# Patient Record
Sex: Female | Born: 1945 | Race: White | Hispanic: No | Smoking: Never smoker
Health system: Southern US, Community
[De-identification: ages and names within clinical notes are randomized; demographics above are authoritative.]

## PROBLEM LIST (undated history)

## (undated) DIAGNOSIS — K5900 Constipation, unspecified: Secondary | ICD-10-CM

## (undated) DIAGNOSIS — I639 Cerebral infarction, unspecified: Secondary | ICD-10-CM

## (undated) DIAGNOSIS — T63331A Toxic effect of venom of brown recluse spider, accidental (unintentional), initial encounter: Secondary | ICD-10-CM

## (undated) DIAGNOSIS — T8859XA Other complications of anesthesia, initial encounter: Secondary | ICD-10-CM

## (undated) DIAGNOSIS — J189 Pneumonia, unspecified organism: Secondary | ICD-10-CM

## (undated) DIAGNOSIS — G4730 Sleep apnea, unspecified: Secondary | ICD-10-CM

## (undated) DIAGNOSIS — C50919 Malignant neoplasm of unspecified site of unspecified female breast: Secondary | ICD-10-CM

## (undated) DIAGNOSIS — E039 Hypothyroidism, unspecified: Secondary | ICD-10-CM

## (undated) DIAGNOSIS — Z1379 Encounter for other screening for genetic and chromosomal anomalies: Secondary | ICD-10-CM

## (undated) DIAGNOSIS — T4145XA Adverse effect of unspecified anesthetic, initial encounter: Secondary | ICD-10-CM

## (undated) DIAGNOSIS — Z86010 Personal history of colonic polyps: Secondary | ICD-10-CM

## (undated) HISTORY — DX: Hypothyroidism, unspecified: E03.9

## (undated) HISTORY — PX: TUBAL LIGATION: SHX77

## (undated) HISTORY — DX: Morbid (severe) obesity due to excess calories: E66.01

## (undated) HISTORY — DX: Encounter for other screening for genetic and chromosomal anomalies: Z13.79

## (undated) HISTORY — DX: Personal history of colonic polyps: Z86.010

## (undated) HISTORY — PX: CATARACT EXTRACTION: SUR2

## (undated) HISTORY — DX: Sleep apnea, unspecified: G47.30

## (undated) HISTORY — DX: Toxic effect of venom of brown recluse spider, accidental (unintentional), initial encounter: T63.331A

## (undated) HISTORY — PX: WISDOM TOOTH EXTRACTION: SHX21

---

## 1985-05-20 DIAGNOSIS — J189 Pneumonia, unspecified organism: Secondary | ICD-10-CM

## 1985-05-20 HISTORY — DX: Pneumonia, unspecified organism: J18.9

## 1996-05-20 HISTORY — PX: INCISION AND DRAINAGE: SHX5863

## 1999-09-08 ENCOUNTER — Ambulatory Visit: Admission: RE | Admit: 1999-09-08 | Discharge: 1999-09-08 | Payer: Self-pay | Admitting: Internal Medicine

## 2000-01-10 ENCOUNTER — Other Ambulatory Visit: Admission: RE | Admit: 2000-01-10 | Discharge: 2000-01-10 | Payer: Self-pay | Admitting: Internal Medicine

## 2000-05-05 ENCOUNTER — Encounter: Payer: Self-pay | Admitting: Internal Medicine

## 2000-05-05 ENCOUNTER — Encounter: Admission: RE | Admit: 2000-05-05 | Discharge: 2000-05-05 | Payer: Self-pay | Admitting: Internal Medicine

## 2000-06-05 ENCOUNTER — Other Ambulatory Visit: Admission: RE | Admit: 2000-06-05 | Discharge: 2000-06-05 | Payer: Self-pay | Admitting: Obstetrics and Gynecology

## 2001-01-29 ENCOUNTER — Other Ambulatory Visit: Admission: RE | Admit: 2001-01-29 | Discharge: 2001-01-29 | Payer: Self-pay | Admitting: Obstetrics and Gynecology

## 2002-01-22 ENCOUNTER — Other Ambulatory Visit: Admission: RE | Admit: 2002-01-22 | Discharge: 2002-01-22 | Payer: Self-pay | Admitting: Internal Medicine

## 2004-11-14 ENCOUNTER — Ambulatory Visit: Payer: Self-pay | Admitting: Internal Medicine

## 2004-11-21 ENCOUNTER — Ambulatory Visit: Payer: Self-pay | Admitting: Internal Medicine

## 2004-11-23 ENCOUNTER — Ambulatory Visit: Payer: Self-pay | Admitting: Cardiology

## 2005-01-03 ENCOUNTER — Ambulatory Visit: Payer: Self-pay | Admitting: Internal Medicine

## 2005-02-14 ENCOUNTER — Ambulatory Visit: Payer: Self-pay | Admitting: Gastroenterology

## 2005-02-28 ENCOUNTER — Ambulatory Visit: Payer: Self-pay | Admitting: Gastroenterology

## 2005-05-02 ENCOUNTER — Ambulatory Visit: Payer: Self-pay | Admitting: Internal Medicine

## 2005-05-08 ENCOUNTER — Ambulatory Visit: Payer: Self-pay | Admitting: Gastroenterology

## 2005-06-13 ENCOUNTER — Ambulatory Visit: Payer: Self-pay | Admitting: Internal Medicine

## 2008-11-24 ENCOUNTER — Emergency Department (HOSPITAL_COMMUNITY): Admission: EM | Admit: 2008-11-24 | Discharge: 2008-11-24 | Payer: Self-pay | Admitting: Emergency Medicine

## 2008-12-30 ENCOUNTER — Ambulatory Visit: Payer: Self-pay | Admitting: Internal Medicine

## 2008-12-30 LAB — CONVERTED CEMR LAB
ALT: 40 units/L — ABNORMAL HIGH (ref 0–35)
AST: 43 units/L — ABNORMAL HIGH (ref 0–37)
Alkaline Phosphatase: 79 units/L (ref 39–117)
BUN: 7 mg/dL (ref 6–23)
Basophils Absolute: 0 10*3/uL (ref 0.0–0.1)
Basophils Relative: 0.1 % (ref 0.0–3.0)
Bilirubin Urine: NEGATIVE
Bilirubin, Direct: 0.1 mg/dL (ref 0.0–0.3)
CO2: 25 meq/L (ref 19–32)
Calcium: 9.1 mg/dL (ref 8.4–10.5)
Chloride: 108 meq/L (ref 96–112)
Cholesterol: 187 mg/dL (ref 0–200)
Creatinine, Ser: 0.6 mg/dL (ref 0.4–1.2)
Eosinophils Absolute: 0.1 10*3/uL (ref 0.0–0.7)
Eosinophils Relative: 2.3 % (ref 0.0–5.0)
GFR calc non Af Amer: 106.64 mL/min (ref >60)
Glucose, Bld: 100 mg/dL — ABNORMAL HIGH (ref 70–99)
Glucose, Urine, Semiquant: NEGATIVE
HCT: 42.4 % (ref 36.0–46.0)
HDL: 41.1 mg/dL (ref >39.00)
Hemoglobin: 14.3 g/dL (ref 12.0–15.0)
Ketones, urine, test strip: NEGATIVE
LDL Cholesterol: 118 mg/dL — ABNORMAL HIGH (ref 0–99)
Lymphocytes Relative: 45 % (ref 12.0–46.0)
Lymphs Abs: 2.4 10*3/uL (ref 0.7–4.0)
MCHC: 33.7 g/dL (ref 30.0–36.0)
MCV: 97.6 fL (ref 78.0–100.0)
Monocytes Absolute: 0.4 10*3/uL (ref 0.1–1.0)
Monocytes Relative: 6.9 % (ref 3.0–12.0)
Neutro Abs: 2.5 10*3/uL (ref 1.4–7.7)
Neutrophils Relative %: 45.7 % (ref 43.0–77.0)
Nitrite: NEGATIVE
Platelets: 161 10*3/uL (ref 150.0–400.0)
Potassium: 3.9 meq/L (ref 3.5–5.1)
RBC: 4.35 M/uL (ref 3.87–5.11)
RDW: 12.2 % (ref 11.5–14.6)
Sodium: 141 meq/L (ref 135–145)
Specific Gravity, Urine: 1.025
TSH: 4.99 microintl units/mL (ref 0.35–5.50)
Total Bilirubin: 0.8 mg/dL (ref 0.3–1.2)
Total CHOL/HDL Ratio: 5
Triglycerides: 139 mg/dL (ref 0.0–149.0)
Urobilinogen, UA: 0.2
VLDL: 27.8 mg/dL (ref 0.0–40.0)
WBC: 5.4 10*3/uL (ref 4.5–10.5)
pH: 5.5

## 2009-01-05 ENCOUNTER — Ambulatory Visit: Payer: Self-pay | Admitting: Internal Medicine

## 2009-01-05 DIAGNOSIS — Z860100 Personal history of colon polyps, unspecified: Secondary | ICD-10-CM

## 2009-01-05 DIAGNOSIS — I10 Essential (primary) hypertension: Secondary | ICD-10-CM | POA: Insufficient documentation

## 2009-01-05 HISTORY — DX: Morbid (severe) obesity due to excess calories: E66.01

## 2009-01-05 HISTORY — DX: Personal history of colon polyps, unspecified: Z86.0100

## 2009-01-05 HISTORY — DX: Personal history of colonic polyps: Z86.010

## 2010-01-25 ENCOUNTER — Ambulatory Visit: Payer: Self-pay | Admitting: Internal Medicine

## 2010-01-29 LAB — CONVERTED CEMR LAB
ALT: 47 units/L — ABNORMAL HIGH (ref 0–35)
AST: 41 units/L — ABNORMAL HIGH (ref 0–37)
Albumin: 3.7 g/dL (ref 3.5–5.2)
Alkaline Phosphatase: 78 units/L (ref 39–117)
BUN: 10 mg/dL (ref 6–23)
Basophils Absolute: 0 10*3/uL (ref 0.0–0.1)
Basophils Relative: 0.6 % (ref 0.0–3.0)
Bilirubin Urine: NEGATIVE
Bilirubin, Direct: 0.2 mg/dL (ref 0.0–0.3)
CO2: 30 meq/L (ref 19–32)
Calcium: 9.1 mg/dL (ref 8.4–10.5)
Chloride: 105 meq/L (ref 96–112)
Cholesterol: 213 mg/dL — ABNORMAL HIGH (ref 0–200)
Creatinine, Ser: 0.6 mg/dL (ref 0.4–1.2)
Direct LDL: 152.3 mg/dL
Eosinophils Absolute: 0.1 10*3/uL (ref 0.0–0.7)
Eosinophils Relative: 1.5 % (ref 0.0–5.0)
GFR calc non Af Amer: 102.34 mL/min (ref >60)
Glucose, Bld: 95 mg/dL (ref 70–99)
HCT: 43.7 % (ref 36.0–46.0)
HDL: 51.4 mg/dL (ref >39.00)
Hemoglobin, Urine: NEGATIVE
Hemoglobin: 15 g/dL (ref 12.0–15.0)
Ketones, ur: 15 mg/dL
Lymphocytes Relative: 45.4 % (ref 12.0–46.0)
Lymphs Abs: 3 10*3/uL (ref 0.7–4.0)
MCHC: 34.3 g/dL (ref 30.0–36.0)
MCV: 97 fL (ref 78.0–100.0)
Monocytes Absolute: 0.5 10*3/uL (ref 0.1–1.0)
Monocytes Relative: 7 % (ref 3.0–12.0)
Neutro Abs: 3 10*3/uL (ref 1.4–7.7)
Neutrophils Relative %: 45.5 % (ref 43.0–77.0)
Nitrite: NEGATIVE
Platelets: 192 10*3/uL (ref 150.0–400.0)
Potassium: 4.7 meq/L (ref 3.5–5.1)
RBC: 4.51 M/uL (ref 3.87–5.11)
RDW: 12.4 % (ref 11.5–14.6)
Sodium: 142 meq/L (ref 135–145)
Specific Gravity, Urine: >=1.03 (ref 1.000–1.030)
TSH: 5.94 microintl units/mL — ABNORMAL HIGH (ref 0.35–5.50)
Total Bilirubin: 0.9 mg/dL (ref 0.3–1.2)
Total CHOL/HDL Ratio: 4
Total Protein, Urine: NEGATIVE mg/dL
Total Protein: 6.9 g/dL (ref 6.0–8.3)
Triglycerides: 131 mg/dL (ref 0.0–149.0)
Urine Glucose: NEGATIVE mg/dL
Urobilinogen, UA: 0.2 (ref 0.0–1.0)
VLDL: 26.2 mg/dL (ref 0.0–40.0)
WBC: 6.6 10*3/uL (ref 4.5–10.5)
pH: 5.5 (ref 5.0–8.0)

## 2010-02-01 ENCOUNTER — Telehealth: Payer: Self-pay | Admitting: Internal Medicine

## 2010-02-01 ENCOUNTER — Ambulatory Visit: Payer: Self-pay | Admitting: Internal Medicine

## 2010-02-01 DIAGNOSIS — E039 Hypothyroidism, unspecified: Secondary | ICD-10-CM | POA: Insufficient documentation

## 2010-04-17 ENCOUNTER — Encounter: Payer: Self-pay | Admitting: Gastroenterology

## 2010-05-01 ENCOUNTER — Encounter (INDEPENDENT_AMBULATORY_CARE_PROVIDER_SITE_OTHER): Payer: Self-pay | Admitting: *Deleted

## 2010-05-20 HISTORY — PX: COLONOSCOPY W/ BIOPSIES AND POLYPECTOMY: SHX1376

## 2010-06-01 ENCOUNTER — Encounter (INDEPENDENT_AMBULATORY_CARE_PROVIDER_SITE_OTHER): Payer: Self-pay | Admitting: *Deleted

## 2010-06-05 ENCOUNTER — Ambulatory Visit
Admission: RE | Admit: 2010-06-05 | Discharge: 2010-06-05 | Payer: Self-pay | Source: Home / Self Care | Attending: Gastroenterology | Admitting: Gastroenterology

## 2010-06-19 ENCOUNTER — Ambulatory Visit
Admission: RE | Admit: 2010-06-19 | Discharge: 2010-06-19 | Payer: Self-pay | Source: Home / Self Care | Attending: Gastroenterology | Admitting: Gastroenterology

## 2010-06-19 ENCOUNTER — Other Ambulatory Visit: Payer: Self-pay | Admitting: Gastroenterology

## 2010-06-19 DIAGNOSIS — D126 Benign neoplasm of colon, unspecified: Secondary | ICD-10-CM

## 2010-06-19 NOTE — Assessment & Plan Note (Signed)
Summary: CPX/CJR   and  Vital Signs:  Patient profile:   65 year old female Height:      60 inches Weight:      253 pounds BMI:     49.59 Temp:     98.7 degrees F oral BP sitting:   136 / 80  (right arm) Cuff size:   large  Vitals Entered By: Duard Brady LPN (February 01, 2010 2:45 PM) CC: cpx - doing well Is Patient Diabetic? No Flu Vaccine Consent Questions     Do you have a history of severe allergic reactions to this vaccine? no    Any prior history of allergic reactions to egg and/or gelatin? no    Do you have a sensitivity to the preservative Thimersol? no    Do you have a past history of Guillan-Barre Syndrome? no    Do you currently have an acute febrile illness? no    Have you ever had a severe reaction to latex? no    Vaccine information given and explained to patient? yes    Are you currently pregnant? no    Lot Number:[id number]   Exp Date:11/17/2010   Site Given  Left Deltoid IM    CC:  cpx - doing well.  History of Present Illness: 65 year old patient who is seen today for a wellness exam.  Medical palms include morbid obesity, hypertension, and colonic polyps.  She has had hypothyroidism diagnosed and treated recently.  Concerns or complaints.  Weight is unchanged over the past year.  Denies any cardiopulmonary complaints  Here for Medicare AWV:  1.   Risk factors based on Past M, S, F history: cardiovascular risk factors include exogenous  obesity and labile hypertension as well as a family history of premature cardiac disease 2.   Physical Activities: sedentary 3.   Depression/mood: no history of depression or mood disorder 4.   Hearing: no deficits 5.   ADL's: independent in all aspects of daily living 6.   Fall Risk: moderate due to obesity 7.   Home Safety: no proms identifying 8.   Height, weight, &visual acuity:height and weight stable.  No difficulty with visual acuity 9.   Counseling: heart healthy diet, weight loss, exercise, all  encouraged 10.   Labs ordered based on risk factors: laboratory profile, including lipid panel, and TSH reviewed 11.           Referral Coordination- needs mammogram 12.           Care Plan- follow-up mammogram, calcium and vitamin D supplementation, weight loss, and exercise.  Encouraged 13.            Cognitive Assessment-  alert and oriented, with normal affect.  No cognitive dysfunction.  No difficulty handling all executive functions   Allergies (verified): No Known Drug Allergies  Past History:  Past Medical History: impaired glucose tolerance exogenous obesity rule out hypertension history of obstructive sleep apnea Colonic polyps, hx of bee sting allergy Hypothyroidism  Past Surgical History: Reviewed history from 01/05/2009 and no changes required. gravida 5, para 5 aborta zero Tubal ligation skin grafting, right anterior lower leg, secondary to brown recluse spider bite, 1998  Family History: Reviewed history from 01/05/2009 and no changes required. father died age 49, throat cancer mother died age 63 breast cancer one half brother in good health.  Two half sisters in good health 3 sisters positive for osteoarthritis, RA, ALD  Social History: Reviewed history from 01/05/2009 and no changes required. Divorced Never Smoked  Review of Systems  The patient denies anorexia, fever, weight loss, weight gain, vision loss, decreased hearing, hoarseness, chest pain, syncope, dyspnea on exertion, peripheral edema, prolonged cough, headaches, hemoptysis, abdominal pain, melena, hematochezia, severe indigestion/heartburn, hematuria, incontinence, genital sores, muscle weakness, suspicious skin lesions, transient blindness, difficulty walking, depression, unusual weight change, abnormal bleeding, enlarged lymph nodes, angioedema, and breast masses.    Physical Exam  General:  overweight-appearing.  130/84overweight-appearing.   Head:  Normocephalic and atraumatic without  obvious abnormalities. No apparent alopecia or balding. Eyes:  No corneal or conjunctival inflammation noted. EOMI. Perrla. Funduscopic exam benign, without hemorrhages, exudates or papilledema. Vision grossly normal. Ears:  External ear exam shows no significant lesions or deformities.  Otoscopic examination reveals clear canals, tympanic membranes are intact bilaterally without bulging, retraction, inflammation or discharge. Hearing is grossly normal bilaterally. Nose:  External nasal examination shows no deformity or inflammation. Nasal mucosa are pink and moist without lesions or exudates. Mouth:  Oral mucosa and oropharynx without lesions or exudates.  Teeth in good repair. Neck:  No deformities, masses, or tenderness noted. Chest Wall:  No deformities, masses, or tenderness noted. Breasts:  No mass, nodules, thickening, tenderness, bulging, retraction, inflamation, nipple discharge or skin changes noted.   Lungs:  Normal respiratory effort, chest expands symmetrically. Lungs are clear to auscultation, no crackles or wheezes. Heart:  Normal rate and regular rhythm. S1 and S2 normal without gallop, murmur, click, rub or other extra sounds. Abdomen:  Bowel sounds positive,abdomen soft and non-tender without masses, organomegaly or hernias noted. Rectal:  No external abnormalities noted. Normal sphincter tone. No rectal masses or tenderness. Genitalia:  Normal introitus for age, no external lesions, no vaginal discharge, mucosa pink and moist, no vaginal or cervical lesions, no vaginal atrophy, no friaility or hemorrhage, normal uterus size and position, no adnexal masses or tenderness Msk:  No deformity or scoliosis noted of thoracic or lumbar spine.   Pulses:  R and L carotid,radial,femoral,dorsalis pedis and posterior tibial pulses are full and equal bilaterally Extremities:  No clubbing, cyanosis, edema, or deformity noted with normal full range of motion of all joints.   Neurologic:  alert &  oriented X3, cranial nerves II-XII intact, sensation intact to light touch, and gait normal.  alert & oriented X3, cranial nerves II-XII intact, sensation intact to light touch, and gait normal.   Skin:  Intact without suspicious lesions or rashes Cervical Nodes:  No lymphadenopathy noted Axillary Nodes:  No palpable lymphadenopathy Inguinal Nodes:  No significant adenopathy Psych:  Cognition and judgment appear intact. Alert and cooperative with normal attention span and concentration. No apparent delusions, illusions, hallucinations   Impression & Recommendations:  Problem # 1:  HEALTH MAINTENANCE EXAM (ICD-V70.0)  Orders: EKG w/ Interpretation (93[address])  Complete Medication List: 1)  Synthroid 50 Mcg Tabs (Levothyroxine sodium) .... Qd 2)  Epipen Jr 0.15 Mg/0.73ml Devi (Epinephrine) .... As directed  Other Orders: Admin 1st Vaccine ([address]) Flu Vaccine 64yrs + ([address]) Zoster (Shingles) Vaccine Live ([address]) Admin of Any Addtl Vaccine ([address])  Patient Instructions: 1)  Please schedule a follow-up appointment in 6 months. 2)  Limit your Sodium (Salt). 3)  It is important that you exercise regularly at least 20 minutes 5 times a week. If you develop chest pain, have severe difficulty breathing, or feel very tired , stop exercising immediately and seek medical attention. 4)  You need to lose weight. Consider a lower calorie diet and regular exercise.  5)  Check your Blood Pressure regularly. If  it is above:  you should make an appointment. Prescriptions: SYNTHROID 50 MCG TABS (LEVOTHYROXINE SODIUM) qd  #90 x 5   Entered and Authorized by:   Gordy Savers  MD   Signed by:   Gordy Savers  MD on 02/01/2010   Method used:   Electronically to        [facility] [phone]* (retail)       [address]       [place], [place]  [address]       Ph: [phone]       Fax: [phone]   RxID:   [id number]    Immunizations Administered:  Zostavax # 0:    Vaccine  Type: Zostavax    Site: right deltoid    Mfr: Merck    Dose: 0.5 ml    Route: [place]    Given by: Duard Brady LPN    Exp. Date: 01/05/2011    Lot #: [id number]    VIS given: 03/01/05 given February 01, 2010.    Physician counseled: yes

## 2010-06-19 NOTE — Letter (Signed)
Summary: Colonoscopy Letter  [facility]  [address]   [place], [place] [address]   Phone: [phone]  Fax: [phone]      April 17, 2010 MRN: [id number]   Jasmine Hood [address] [place], [place]  [address]   Dear Ms. Mizner,   According to your medical record, it is time for you to schedule a Colonoscopy. The American Cancer Society recommends this procedure as a method to detect early colon cancer. Patients with a family history of colon cancer, or a personal history of colon polyps or inflammatory bowel disease are at increased risk.  This letter has been generated based on the recommendations made at the time of your procedure. If you feel that in your particular situation this may no longer apply, please contact our office.  Please call our office at [phone] to schedule this appointment or to update your records at your earliest convenience.  Thank you for cooperating with [place] to provide you with the very best care possible.   Sincerely,   Barbette Hair. Arlyce Dice, M.D.  [facility] [phone]

## 2010-06-19 NOTE — Progress Notes (Signed)
Summary: Pt said [facility] didnt rcv script for Epipen. Pls call in asap  Phone Note Call from Patient Call back at [phone] cell   Caller: Patient Summary of Call: Pt is at [facility] on [address] and pharmacist told pt that they did not rcv script for the Epipen. They rcvd the script for Thyroid med. Pls call in to the [facility] on [address]. Do not resend Epipen electronically. Pt is waiting a pharmacy.  Initial call taken by: Lucy Antigua,  February 01, 2010 4:58 PM  Follow-up for Phone Call        please advise. kik Follow-up by: Duard Brady LPN,  February 01, 2010 5:24 PM  Additional Follow-up for Phone Call Additional follow up Details #1::        ok Additional Follow-up by: Gordy Savers  MD,  February 01, 2010 5:41 PM

## 2010-06-19 NOTE — Letter (Signed)
Summary: Immunization/Shot Record  [facility]  [address] [place], [place] [address]   Phone: [phone]  Fax: [phone]     Immunization Record for: Jasmine Hood  Vaccine 1 2 3 4 5 6  HepB Hepatitis B                    DTP Diphtheria, Tetanus, Pertussis                         HIB Haemophilus influenzae Type b                 ZHYQMVHQIO IPV Inactivated Poliovirus             MMR Measles, Mumps, Jeanella Craze NGEXBMWUXL KGMWNUUVOZ Varicella Varivax    DGUYQIHKVQ QVZDGLOVFI EPPIRJJOAC Pneumococcal           Hep A Hepatitis A   ZYSAYTKZSW FUXNATFTDD UKGURKYHCW CBJSEGBTDV        Tetanus Booster Date of Last: Td 01/05/2009  Flu Shot Date of Last: 02/01/2010 Pneumovax Date of Last: 01/05/2009 Meningococcal Vaccine Given:       Other Vaccines HPV Vaccine/ Date of Last:    Vaccine/ Date of Last:    Vaccine/ Date of Last:     Jasmine Hood  VOHYWVPXTG  GYIRSWNIOE Rotavirus Vaccine/ Date of Last:    Vaccine/ Date of Last:    Vaccine/ Date of Last:     VOJJKKXFGH  WEXHBZJIRC  VELFYBOFBP Zostavax Vaccine/ Date of Last: Zostavax 02/01/2010   Jasmine Hood  ZWCHENIDPO  EUMPNTIRWE  RXVQMGQQPY  PPJKDTOIZT  Recommended Childhood and Adolescent Immunization Schedule [place]  2006 Vaccine Age Birth 1 mos 2 mos 4 mos 6 mos 12 mos 15 mos 18 mos 24 mos 4-6 yrs 11-12 yrs 13-14 yrs 15 yrs 16-18 yrs Hepatitis B1 HepB HepB HepB1  HepB  HepB Series Catch-Up Diphtheria, Tetanus, Pertussis2   DTaP DTaP DTaP   DTaP  DTaP Tdap  Tdap Catch-Up Haemophilus influenzae type b3   Hib Hib Hib3 Hib        Inactivated Poliovirus   IPV IPV  IPV   IPV     Measles, Mumps, Rubella4      MMR   MMR M MR MMR Catch-Up Varicella5       Varicella  Varicella  Catch-Up Meningo-coccal6           MCV4  MCV4 CatchUpV4           MPSV4 for High Risk Groups  C MCV4 for High Risk Groups Pneumo-coccal7   PCV PCV PCV PCV   PCV  Catch-Up PPV for High Risk Groups         PPV for High Risk Groups  Influenza8      Influenza (Yearly)  Influenza (Yearly) for High Risk Groups Hepatitis A9       HepA Series  This schedule indicates the recommended ages for routine administration of currently licensed childhood vaccines, as of April 19, 2004, for children through age 21 years. Any dose not administered at the recommended age should be administered at any subsequent visit when indicated and feasible. Indicates age groups that warrant special effort to administer those vaccines not previously administered. Additional vaccines may be licensed and recommended during the year. Licensed combination vaccines may be used whenever any components of the combination are indicated and other components of the vaccine are not contraindicated and if approved by the Food and Drug  Administration for that dose of the series. Providers should consult the respective ACIP statement for detailed recommendations. Clinically significant adverse events that follow immunization should be reported to the Vaccine Adverse Event Reporting System (VAERS). Guidance about how to obtain and complete a VAERS form is available at www.vaers.LAgents.no or by telephone, [phone].  The Childhood and Adolescent Immunization Schedule is approved by: Advisory Committee on Administrator http://www.wade.com/   American Academy of Pediatrics BridgeDigest.com.cy   American Academy [employer] American.aafp.org

## 2010-06-21 NOTE — Letter (Signed)
Summary: Pre Visit Letter Revised  [facility]  [address] [place], [place] [address]   Phone: [phone]  Fax: [phone]        05/01/2010 MRN: [id number]   Jasmine Hood [address] [place], [place]  [address]    Procedure Date:  June 19, 2010 @ 9:30 A.M. -- ARRIVAL TIME:  8:30 A.M.  Welcome to the Gastroenterology Division at [facility].    You are scheduled to see a nurse for your pre-procedure visit on TUESDAY, June 05, 2010 at 10:00 A.M. on the 3rd floor at [facility], [address]  We ask that you try to arrive at our office 15 minutes prior to your appointment time to allow for check-in.  Please take a minute to review the attached form.  If you answer "Yes" to one or more of the questions on the first page, we ask that you call the person listed at your earliest opportunity.  If you answer "No" to all of the questions, please complete the rest of the form and bring it to your appointment.    Your nurse visit will consist of discussing your medical and surgical history, your immediate family medical history, and your medications.   If you are unable to list all of your medications on the form, please bring the medication bottles to your appointment and we will list them.  We will need to be aware of both prescribed and over the counter drugs.  We will need to know exact dosage information as well.    Please be prepared to read and sign documents such as consent forms, a financial agreement, and acknowledgement forms.  If necessary, and with your consent, a friend or relative is welcome to sit-in on the nurse visit with you.  Please bring your insurance card so that we may make a copy of it.  If your insurance requires a referral to see a specialist, please bring your referral form from your primary care physician.  No co-pay is required for this nurse visit.     If you cannot keep your appointment, please call [phone] to cancel  or reschedule prior to your appointment date.  This allows [place] the opportunity to schedule an appointment for another patient in need of care.    Thank you for choosing [facility] for your medical needs.  We appreciate the opportunity to care for you.  Please visit [place] at our website  to learn more about our practice.  Sincerely, The Gastroenterology Division

## 2010-06-21 NOTE — Miscellaneous (Signed)
Summary: [facility]  Clinical Lists Changes  Medications: Added new medication of MOVIPREP 100 GM  SOLR (PEG-KCL-NACL-NASULF-NA ASC-C) As per prep instructions. - Signed Rx of MOVIPREP 100 GM  SOLR (PEG-KCL-NACL-NASULF-NA ASC-C) As per prep instructions.;  #1 x 0;  Signed;  Entered by: Ezra Sites RN;  Authorized by: Louis Meckel MD;  Method used: Electronically to [facility] [phone]*, [address] [place], [place]  [address], Ph: [phone], Fax: [phone] Observations: Added new observation of NKA: T (06/05/2010 9:42)    Prescriptions: MOVIPREP 100 GM  SOLR (PEG-KCL-NACL-NASULF-NA ASC-C) As per prep instructions.  #1 x 0   Entered by:   Ezra Sites RN   Authorized by:   Louis Meckel MD   Signed by:   Ezra Sites RN on 06/05/2010   Method used:   Electronically to        [facility] [phone]* (retail)       [address]       [place], [place]  [address]       Ph: [phone]       Fax: [phone]   RxID:   [id number]

## 2010-06-21 NOTE — Letter (Signed)
Summary: [facility] Instructions  [facility]  [address] [place], [place] [address]   Phone: [phone]  Fax: [phone]       Jasmine Hood    11/19/1945    MRN: [id number]        Procedure Day /Date: Tuesday 06-19-10      Arrival Time: 8:30 am     Procedure Time: 9:30 am     Location of Procedure:                    _x _  [facility] (4th Floor)                        PREPARATION FOR COLONOSCOPY WITH MOVIPREP  Starting 5 days prior to your procedure  06-14-10  do not eat nuts, seeds, popcorn, corn, beans, peas,  salads, or any raw vegetables.  Do not take any fiber supplements (e.g. Metamucil, Citrucel, and Benefiber).  THE DAY BEFORE YOUR PROCEDURE         DATE:  06-18-10  DAY: Monday  1.  Drink clear liquids the entire day-NO SOLID FOOD  2.  Do not drink anything colored red or purple.  Avoid juices with pulp.  No orange juice.  3.  Drink at least 64 oz. (8 glasses) of fluid/clear liquids during the day to prevent dehydration and help the prep work efficiently.  CLEAR LIQUIDS INCLUDE: Water Jello Ice Popsicles Tea (sugar ok, no milk/cream) Powdered fruit flavored drinks Coffee (sugar ok, no milk/cream) Gatorade Juice: apple, white grape, white cranberry  Lemonade Clear bullion, consomm, broth Carbonated beverages (any kind) Strained chicken noodle soup Hard Candy                             4.  In the morning, mix first dose of MoviPrep solution:    Empty 1 Pouch A and 1 Pouch B into the disposable container    Add lukewarm drinking water to the top line of the container. Mix to dissolve    Refrigerate (mixed solution should be used within 24 hrs)  5.  Begin drinking the prep at 5:00 p.m. The MoviPrep container is divided by 4 marks.   Every 15 minutes drink the solution down to the next mark (approximately 8 oz) until the full liter is complete.   6.  Follow completed prep with 16 oz of clear liquid of your choice (Nothing  red or purple).  Continue to drink clear liquids until bedtime.  7.  Before going to bed, mix second dose of MoviPrep solution:    Empty 1 Pouch A and 1 Pouch B into the disposable container    Add lukewarm drinking water to the top line of the container. Mix to dissolve    Refrigerate  THE DAY OF YOUR PROCEDURE      DATE:  06-19-10  DAY: Tuesday  Beginning at  4:30 a.m. (5 hours before procedure):         1. Every 15 minutes, drink the solution down to the next mark (approx 8 oz) until the full liter is complete.  2. Follow completed prep with 16 oz. of clear liquid of your choice.    3. You may drink clear liquids until  7:30 a.m. (2 HOURS BEFORE PROCEDURE).   MEDICATION INSTRUCTIONS  Unless otherwise instructed, you should take regular prescription medications with a small sip of water  as early as possible the morning of your procedure.          OTHER INSTRUCTIONS  You will need a responsible adult at least 67 years of age to accompany you and drive you home.   This person must remain in the waiting room during your procedure.  Wear loose fitting clothing that is easily removed.  Leave jewelry and other valuables at home.  However, you may wish to bring a book to read or  an iPod/MP3 player to listen to music as you wait for your procedure to start.  Remove all body piercing jewelry and leave at home.  Total time from sign-in until discharge is approximately 2-3 hours.  You should go home directly after your procedure and rest.  You can resume normal activities the  day after your procedure.  The day of your procedure you should not:   Drive   Make legal decisions   Operate machinery   Drink alcohol   Return to work  You will receive specific instructions about eating, activities and medications before you leave.    The above instructions have been reviewed and explained to me by   Ezra Sites RN  June 05, 2010 10:03 AM     I fully  understand and can verbalize these instructions _____________________________ Date _________

## 2010-06-26 ENCOUNTER — Encounter: Payer: Self-pay | Admitting: Gastroenterology

## 2010-06-27 NOTE — Procedures (Addendum)
Summary: Colonoscopy  Patient: Jasmine Hood Note: All result statuses are Final unless otherwise noted.  Tests: (1) Colonoscopy (COL)   COL Colonoscopy           DONE     [facility]     [address]     [place], [place]  [address]           COLONOSCOPY PROCEDURE REPORT           PATIENT:  Jasmine, Hood  MR#:  [id number]     BIRTHDATE:  1946-02-07, 64 yrs. old  GENDER:  female           ENDOSCOPIST:  Barbette Hair. Arlyce Dice, MD     Referred by:           PROCEDURE DATE:  06/19/2010     PROCEDURE:  Colonoscopy with snare polypectomy     ASA CLASS:  Class II     INDICATIONS:  1) screening  2) history of pre-cancerous     (adenomatous) colon polyps Index polypectomy 2003     Colo 2006 normal           MEDICATIONS:   Fentanyl 50 mcg IV, Versed 5 mg IV           DESCRIPTION OF PROCEDURE:   After the risks benefits and     alternatives of the procedure were thoroughly explained, informed     consent was obtained.  Digital rectal exam was performed and     revealed no abnormalities.   The [id number] endoscope was     introduced through the anus and advanced to the cecum, which was     identified by both the appendix and ileocecal valve, without     limitations.  The quality of the prep was excellent, using     MoviPrep.  The instrument was then slowly withdrawn as the colon     was fully examined.     <<PROCEDUREIMAGES>>           FINDINGS:  A sessile polyp was found in the cecum. It was 2 - 3 mm     in size. Polyp was snared without cautery. Retrieval was     successful (see image4). snare polyp  This was otherwise a normal     examination of the colon (see image1, image5, image7, image8,     image9, image10, and image11).   Retroflexed views in the rectum     revealed no abnormalities.    The time to cecum =  2.50  minutes.     The scope was then withdrawn (time =  9.25  min) from the patient     and the procedure completed.           COMPLICATIONS:  None        ENDOSCOPIC IMPRESSION:     1) 2 - 3 mm sessile polyp in the cecum     2) Otherwise normal examination     RECOMMENDATIONS:     1) If the polyp(s) removed today are proven to be adenomatous     (pre-cancerous) polyps, you will need a repeat colonoscopy in 5     years. Otherwise you should continue to follow colorectal cancer     screening guidelines for "routine risk" patients with colonoscopy     in 10 years.           REPEAT EXAM:   You will receive a letter from Dr. Arlyce Dice in 1-2  weeks, after reviewing the final pathology, with followup     recommendations.           ______________________________     Barbette Hair Arlyce Dice, MD           CC:  Gordy Savers, MD           n.     Rosalie Doctor:   Barbette Hair. Kaplan at 06/19/2010 09:31 AM           Cheryle Horsfall, [phone]  Note: An exclamation mark (!) indicates a result that was not dispersed into the flowsheet. Document Creation Date: 06/19/2010 9:32 AM _______________________________________________________________________  (1) Order result status: Final Collection or observation date-time: 06/19/2010 09:26 Requested date-time:  Receipt date-time:  Reported date-time:  Referring Physician:   Ordering Physician: Melvia Heaps [id number]) Specimen Source:  Source: Launa Grill Order Number: [id number] Lab site:   Appended Document: Colonoscopy recall     Procedures Next Due Date:    Colonoscopy: 05/2015

## 2010-07-05 NOTE — Letter (Signed)
Summary: Patient Notice- Polyp Results  [facility]  [address] [place], [place] [address]   Phone: [phone]  Fax: [phone]        June 26, 2010 MRN: [id number]    Jasmine Hood [address] [place], [place]  [address]    Dear Jasmine Hood,  I am pleased to inform you that the colon polyp(s) removed during your recent colonoscopy was (were) found to be benign (no cancer detected) upon pathologic examination.  I recommend you have a repeat colonoscopy examination in 5_ years to look for recurrent polyps, as having colon polyps increases your risk for having recurrent polyps or even colon cancer in the future.  Should you develop new or worsening symptoms of abdominal pain, bowel habit changes or bleeding from the rectum or bowels, please schedule an evaluation with either your primary care physician or with me.  Additional information/recommendations:  __ No further action with gastroenterology is needed at this time. Please      follow-up with your primary care physician for your other healthcare      needs.  __ Please call [phone] to schedule a return visit to review your      situation.  __ Please keep your follow-up visit as already scheduled.  _x_ Continue treatment plan as outlined the day of your exam.  Please call us if you are having persistent problems or have questions about your condition that have not been fully answered at this time.  Sincerely,  Louis Meckel MD  This letter has been electronically signed by your physician.  Appended Document: Patient Notice- Polyp Results LETTER MAILED

## 2010-08-26 LAB — COMPREHENSIVE METABOLIC PANEL
ALT: 27 U/L (ref 0–35)
AST: 31 U/L (ref 0–37)
Albumin: 3.4 g/dL — ABNORMAL LOW (ref 3.5–5.2)
Alkaline Phosphatase: 65 U/L (ref 39–117)
BUN: 7 mg/dL (ref 6–23)
CO2: 26 mEq/L (ref 19–32)
Calcium: 8.9 mg/dL (ref 8.4–10.5)
Chloride: 101 mEq/L (ref 96–112)
Creatinine, Ser: 0.72 mg/dL (ref 0.4–1.2)
GFR calc Af Amer: >60 mL/min (ref >60)
GFR calc non Af Amer: >60 mL/min (ref >60)
Glucose, Bld: 102 mg/dL — ABNORMAL HIGH (ref 70–99)
Potassium: 3.6 mEq/L (ref 3.5–5.1)
Sodium: 139 mEq/L (ref 135–145)
Total Bilirubin: 1.1 mg/dL (ref 0.3–1.2)
Total Protein: 7.2 g/dL (ref 6.0–8.3)

## 2010-08-26 LAB — URINALYSIS, ROUTINE W REFLEX MICROSCOPIC
Bilirubin Urine: NEGATIVE
Glucose, UA: NEGATIVE mg/dL
Hgb urine dipstick: NEGATIVE
Ketones, ur: >80 mg/dL — AB
Nitrite: NEGATIVE
Protein, ur: NEGATIVE mg/dL
Specific Gravity, Urine: 1.014 (ref 1.005–1.030)
Urobilinogen, UA: 1 mg/dL (ref 0.0–1.0)
pH: 6 (ref 5.0–8.0)

## 2010-08-26 LAB — DIFFERENTIAL
Basophils Absolute: 0 10*3/uL (ref 0.0–0.1)
Basophils Relative: 0 % (ref 0–1)
Eosinophils Absolute: 0.1 10*3/uL (ref 0.0–0.7)
Eosinophils Relative: 2 % (ref 0–5)
Lymphocytes Relative: 34 % (ref 12–46)
Lymphs Abs: 2.8 10*3/uL (ref 0.7–4.0)
Monocytes Absolute: 0.5 10*3/uL (ref 0.1–1.0)
Monocytes Relative: 7 % (ref 3–12)
Neutro Abs: 4.7 10*3/uL (ref 1.7–7.7)
Neutrophils Relative %: 57 % (ref 43–77)

## 2010-08-26 LAB — CBC
HCT: 44.2 % (ref 36.0–46.0)
Hemoglobin: 15.3 g/dL — ABNORMAL HIGH (ref 12.0–15.0)
MCHC: 34.5 g/dL (ref 30.0–36.0)
MCV: 95.6 fL (ref 78.0–100.0)
Platelets: 172 10*3/uL (ref 150–400)
RBC: 4.63 MIL/uL (ref 3.87–5.11)
RDW: 12.4 % (ref 11.5–15.5)
WBC: 8.2 10*3/uL (ref 4.0–10.5)

## 2010-08-26 LAB — LIPASE, BLOOD: Lipase: 15 U/L (ref 11–59)

## 2010-08-26 LAB — URINE MICROSCOPIC-ADD ON

## 2011-08-01 ENCOUNTER — Encounter: Payer: Self-pay | Admitting: Internal Medicine

## 2011-08-01 ENCOUNTER — Ambulatory Visit (INDEPENDENT_AMBULATORY_CARE_PROVIDER_SITE_OTHER): Payer: Medicare Other | Admitting: Internal Medicine

## 2011-08-01 VITALS — BP 160/110 | Temp 97.8°F | Wt 253.0 lb

## 2011-08-01 DIAGNOSIS — IMO0002 Reserved for concepts with insufficient information to code with codable children: Secondary | ICD-10-CM

## 2011-08-01 DIAGNOSIS — R7302 Impaired glucose tolerance (oral): Secondary | ICD-10-CM

## 2011-08-01 DIAGNOSIS — E039 Hypothyroidism, unspecified: Secondary | ICD-10-CM

## 2011-08-01 DIAGNOSIS — I10 Essential (primary) hypertension: Secondary | ICD-10-CM

## 2011-08-01 DIAGNOSIS — Z860100 Personal history of colon polyps, unspecified: Secondary | ICD-10-CM

## 2011-08-01 DIAGNOSIS — R7309 Other abnormal glucose: Secondary | ICD-10-CM

## 2011-08-01 LAB — LIPID PANEL
Cholesterol: 232 mg/dL — ABNORMAL HIGH (ref 0–200)
Total CHOL/HDL Ratio: 4
Triglycerides: 154 mg/dL — ABNORMAL HIGH (ref 0.0–149.0)
VLDL: 30.8 mg/dL (ref 0.0–40.0)

## 2011-08-01 LAB — CBC WITH DIFFERENTIAL/PLATELET
Basophils Absolute: 0 10*3/uL (ref 0.0–0.1)
Eosinophils Absolute: 0.2 10*3/uL (ref 0.0–0.7)
HCT: 44.6 % (ref 36.0–46.0)
Hemoglobin: 14.8 g/dL (ref 12.0–15.0)
Lymphs Abs: 2.6 10*3/uL (ref 0.7–4.0)
MCHC: 33.1 g/dL (ref 30.0–36.0)
MCV: 97.5 fl (ref 78.0–100.0)
Monocytes Absolute: 0.5 10*3/uL (ref 0.1–1.0)
Neutro Abs: 3.3 10*3/uL (ref 1.4–7.7)
RDW: 12.7 % (ref 11.5–14.6)

## 2011-08-01 LAB — COMPREHENSIVE METABOLIC PANEL
ALT: 46 U/L — ABNORMAL HIGH (ref 0–35)
AST: 39 U/L — ABNORMAL HIGH (ref 0–37)
Creatinine, Ser: 0.6 mg/dL (ref 0.4–1.2)
Sodium: 144 mEq/L (ref 135–145)
Total Bilirubin: 0.2 mg/dL — ABNORMAL LOW (ref 0.3–1.2)
Total Protein: 7.7 g/dL (ref 6.0–8.3)

## 2011-08-01 MED ORDER — LISINOPRIL-HYDROCHLOROTHIAZIDE 20-25 MG PO TABS: 1.0000 | ORAL_TABLET | Freq: Every day | ORAL | Status: DC

## 2011-08-01 NOTE — Progress Notes (Signed)
  Subjective:    Patient ID: Jasmine Hood, female    DOB: [birth year], 66 y.o.   MRN: [id number]  HPI  66 year old patient who has a history of obesity and hypertension. She has not been seen in several years. She also has a history of hypothyroidism and has been off all medications. She has a history of colonic polyps. She was seen by ophthalmology recently and noted to have elevated blood pressure reading yesterday. She feels well. She states as she does now exercise at a health club. Random blood sugar 91. Weight 253 which was her weight at her last visit here    Review of Systems  Constitutional: Negative.   HENT: Negative for hearing loss, congestion, sore throat, rhinorrhea, dental problem, sinus pressure and tinnitus.   Eyes: Negative for pain, discharge and visual disturbance.  Respiratory: Negative for cough and shortness of breath.   Cardiovascular: Negative for chest pain, palpitations and leg swelling.  Gastrointestinal: Negative for nausea, vomiting, abdominal pain, diarrhea, constipation, blood in stool and abdominal distention.  Genitourinary: Negative for dysuria, urgency, frequency, hematuria, flank pain, vaginal bleeding, vaginal discharge, difficulty urinating, vaginal pain and pelvic pain.  Musculoskeletal: Negative for joint swelling, arthralgias and gait problem.  Skin: Negative for rash.  Neurological: Negative for dizziness, syncope, speech difficulty, weakness, numbness and headaches.  Hematological: Negative for adenopathy.  Psychiatric/Behavioral: Negative for behavioral problems, dysphoric mood and agitation. The patient is not nervous/anxious.        Objective:   Physical Exam  Constitutional: She is oriented to person, place, and time. She appears well-developed and well-nourished.       Obese no distress Weight 253 Blood pressure 180/100  HENT:  Head: Normocephalic.  Right Ear: External ear normal.  Left Ear: External ear normal.  Mouth/Throat:  Oropharynx is clear and moist.  Eyes: Conjunctivae and EOM are normal. Pupils are equal, round, and reactive to light.  Neck: Normal range of motion. Neck supple. No thyromegaly present.  Cardiovascular: Normal rate, regular rhythm, normal heart sounds and intact distal pulses.   Pulmonary/Chest: Effort normal and breath sounds normal.  Abdominal: Soft. Bowel sounds are normal. She exhibits no mass. There is no tenderness.  Musculoskeletal: Normal range of motion.  Lymphadenopathy:    She has no cervical adenopathy.  Neurological: She is alert and oriented to person, place, and time.  Skin: Skin is warm and dry. No rash noted.  Psychiatric: She has a normal mood and affect. Her behavior is normal.          Assessment & Plan:   Hypertension. We'll start lisinopril hydrochlorothiazide recheck 2 weeks low salt diet weight loss exercise are encouraged Morbid obesity Hypothyroidism. We'll check a TSH  Screening lab will be reviewed

## 2011-08-01 NOTE — Patient Instructions (Signed)

## 2011-08-15 ENCOUNTER — Encounter: Payer: Self-pay | Admitting: Internal Medicine

## 2011-08-15 ENCOUNTER — Ambulatory Visit (INDEPENDENT_AMBULATORY_CARE_PROVIDER_SITE_OTHER): Payer: Medicare Other | Admitting: Internal Medicine

## 2011-08-15 VITALS — BP 110/70 | Temp 98.2°F | Wt 249.0 lb

## 2011-08-15 DIAGNOSIS — I10 Essential (primary) hypertension: Secondary | ICD-10-CM

## 2011-08-15 DIAGNOSIS — IMO0002 Reserved for concepts with insufficient information to code with codable children: Secondary | ICD-10-CM

## 2011-08-15 NOTE — Patient Instructions (Signed)

## 2011-08-15 NOTE — Progress Notes (Signed)
  Subjective:    Patient ID: Jasmine Hood, female    DOB: [birth year], 66 y.o.   MRN: [id number]  HPI  66 year old patient who is seen today for followup of her hypertension. She was placed on combination therapy 2 weeks ago due to moderately severe hypertension. She has tolerated his medication well without difficulty she is on a combination lisinopril hydrochlorothiazide. Blood pressure today is well controlled she is asymptomatic. Laboratory studies from 2 weeks ago were reviewed she has mild dyslipidemia and impaired glucose tolerance.  She is cleared for cataract extraction surgery.  BP Readings from Last 3 Encounters:  08/15/11 110/70  08/01/11 160/110  02/01/10 136/80    Review of Systems  Constitutional: Negative.        Objective:   Physical Exam  Constitutional: She appears well-developed and well-nourished. No distress.       Obese. No distress initial blood pressure 110/70 Follow blood pressure 120/78          Assessment & Plan:   Hypertension stable Exogenous obesity. Weight loss encouraged Impaired glucose tolerance stable  Recheck 3 months

## 2011-12-12 ENCOUNTER — Encounter: Payer: Self-pay | Admitting: Internal Medicine

## 2011-12-19 ENCOUNTER — Encounter: Payer: Self-pay | Admitting: Internal Medicine

## 2011-12-19 ENCOUNTER — Ambulatory Visit (INDEPENDENT_AMBULATORY_CARE_PROVIDER_SITE_OTHER): Payer: Medicare Other | Admitting: Internal Medicine

## 2011-12-19 VITALS — BP 130/84 | Temp 98.2°F | Wt 243.0 lb

## 2011-12-19 DIAGNOSIS — IMO0002 Reserved for concepts with insufficient information to code with codable children: Secondary | ICD-10-CM

## 2011-12-19 MED ORDER — LISINOPRIL-HYDROCHLOROTHIAZIDE 20-25 MG PO TABS: 1.0000 | ORAL_TABLET | Freq: Every day | ORAL | Status: DC

## 2011-12-19 NOTE — Patient Instructions (Signed)
Limit your sodium (Salt) intake  Please check your blood pressure on a regular basis.  If it is consistently greater than 150/90, please make an office appointment.  You need to lose weight.  Consider a lower calorie diet and regular exercise.  Annual exam March 2014

## 2011-12-19 NOTE — Progress Notes (Signed)
  Subjective:    Patient ID: Jasmine Hood, female    DOB: 11/25/1945, 66 y.o.   MRN: [id number]  HPI  66 year old patient seen today for followup of hypertension. She is doing quite well she's had some modest weight loss over the past few months. She has done well with cataract extraction surgery and is quite pleased with the results. No new concerns or complaints. She was seen for her annual exam in March of this year  Wt Readings from Last 3 Encounters:  12/19/11 243 lb (110.224 kg)  08/15/11 249 lb (112.946 kg)  08/01/11 253 lb (114.76 kg)   BP Readings from Last 3 Encounters:  12/19/11 130/84  08/15/11 110/70  08/01/11 160/110    Review of Systems  Constitutional: Negative.   HENT: Negative for hearing loss, congestion, sore throat, rhinorrhea, dental problem, sinus pressure and tinnitus.   Eyes: Negative for pain, discharge and visual disturbance.  Respiratory: Negative for cough and shortness of breath.   Cardiovascular: Negative for chest pain, palpitations and leg swelling.  Gastrointestinal: Negative for nausea, vomiting, abdominal pain, diarrhea, constipation, blood in stool and abdominal distention.  Genitourinary: Negative for dysuria, urgency, frequency, hematuria, flank pain, vaginal bleeding, vaginal discharge, difficulty urinating, vaginal pain and pelvic pain.  Musculoskeletal: Negative for joint swelling, arthralgias and gait problem.  Skin: Negative for rash.  Neurological: Negative for dizziness, syncope, speech difficulty, weakness, numbness and headaches.  Hematological: Negative for adenopathy.  Psychiatric/Behavioral: Negative for behavioral problems, dysphoric mood and agitation. The patient is not nervous/anxious.        Objective:   Physical Exam  Constitutional: She is oriented to person, place, and time. She appears well-developed and well-nourished.  HENT:  Head: Normocephalic.  Right Ear: External ear normal.  Left Ear: External ear normal.    Mouth/Throat: Oropharynx is clear and moist.  Eyes: Conjunctivae and EOM are normal. Pupils are equal, round, and reactive to light.  Neck: Normal range of motion. Neck supple. No thyromegaly present.  Cardiovascular: Normal rate, regular rhythm, normal heart sounds and intact distal pulses.   Pulmonary/Chest: Effort normal and breath sounds normal.  Abdominal: Soft. Bowel sounds are normal. She exhibits no mass. There is no tenderness.  Musculoskeletal: Normal range of motion.  Lymphadenopathy:    She has no cervical adenopathy.  Neurological: She is alert and oriented to person, place, and time.  Skin: Skin is warm and dry. No rash noted.  Psychiatric: She has a normal mood and affect. Her behavior is normal.          Assessment & Plan:   Hypertension stable. Morbid obesity  We'll continue present regimen will recheck in the spring for her annual physical

## 2011-12-30 ENCOUNTER — Telehealth: Payer: Self-pay | Admitting: Internal Medicine

## 2011-12-30 NOTE — Telephone Encounter (Signed)
Caller: [name]/Patient; Patient Name: Jasmine Hood; PCP: Eleonore Chiquito; Best Callback Phone Number: [phone].  Patient calling, reports on her left leg in popliteal area is red to purple/dark red size of coffee cup, warm to touch; onset 12/23/11.   "Looks like it is getting infected.".  Emergent symptoms ruled out. Advised see provider in 4 hours per Leg Non-Injury protocol. Advised Emergency Room as office is currently closed.   Caller informed of same.  Caller refused to go to ED or Urgent care  and insists that she get appointment at office.  Advised follow up with office during regular hours to request appointment. She was very upset about this.

## 2011-12-31 ENCOUNTER — Ambulatory Visit (INDEPENDENT_AMBULATORY_CARE_PROVIDER_SITE_OTHER): Payer: Medicare Other | Admitting: Family Medicine

## 2011-12-31 ENCOUNTER — Encounter: Payer: Self-pay | Admitting: Family Medicine

## 2011-12-31 VITALS — BP 130/90 | Temp 98.3°F | Wt 245.0 lb

## 2011-12-31 DIAGNOSIS — L255 Unspecified contact dermatitis due to plants, except food: Secondary | ICD-10-CM

## 2011-12-31 DIAGNOSIS — L247 Irritant contact dermatitis due to plants, except food: Secondary | ICD-10-CM | POA: Insufficient documentation

## 2011-12-31 NOTE — Telephone Encounter (Signed)
Appt sched with Dr. Tawanna Cooler 9:45 8/13.

## 2011-12-31 NOTE — Progress Notes (Signed)
  Subjective:    Patient ID: Jasmine Hood, female    DOB: [birth year], 66 y.o.   MRN: [id number]  HPI Jasmine Hood is a 66 year old female who comes in today for evaluation of a rash behind her left knee for 4 days  She states in 1998 she got a brown recluse spider bite and had to have surgery by Dr. Shon Hough.  She was at the beach over the weekend and noticed that irritated itchy spot behind her knee and is concerned it may be another spider bite she's been applying antibiotic ointment    Review of Systems    general and dermatologic review of systems otherwise negative Objective:   Physical Exam  Well-developed well-nourished female no acute distress examination skin shows a silver dollar sized lesion popliteal fossa area left consistent with a contact dermatitis      Assessment & Plan:  Contact dermatitis OTC steroid cream 3 times daily return when necessary

## 2011-12-31 NOTE — Patient Instructions (Signed)
Apply the steroid cream 3 times daily return when necessary

## 2011-12-31 NOTE — Telephone Encounter (Signed)
Please schedule office appt

## 2012-05-12 ENCOUNTER — Emergency Department (HOSPITAL_COMMUNITY)
Admission: EM | Admit: 2012-05-12 | Discharge: 2012-05-12 | Disposition: A | Discharge status: Home / Self Care | Payer: No Typology Code available for payment source | Attending: Emergency Medicine | Admitting: Emergency Medicine

## 2012-05-12 ENCOUNTER — Encounter (HOSPITAL_COMMUNITY): Payer: Self-pay | Admitting: Emergency Medicine

## 2012-05-12 ENCOUNTER — Emergency Department (HOSPITAL_COMMUNITY): Payer: No Typology Code available for payment source

## 2012-05-12 DIAGNOSIS — IMO0002 Reserved for concepts with insufficient information to code with codable children: Secondary | ICD-10-CM | POA: Insufficient documentation

## 2012-05-12 DIAGNOSIS — S139XXA Sprain of joints and ligaments of unspecified parts of neck, initial encounter: Secondary | ICD-10-CM | POA: Insufficient documentation

## 2012-05-12 DIAGNOSIS — T148XXA Other injury of unspecified body region, initial encounter: Secondary | ICD-10-CM

## 2012-05-12 DIAGNOSIS — Z79899 Other long term (current) drug therapy: Secondary | ICD-10-CM | POA: Insufficient documentation

## 2012-05-12 DIAGNOSIS — I10 Essential (primary) hypertension: Secondary | ICD-10-CM | POA: Insufficient documentation

## 2012-05-12 DIAGNOSIS — Z9851 Tubal ligation status: Secondary | ICD-10-CM | POA: Insufficient documentation

## 2012-05-12 DIAGNOSIS — G4730 Sleep apnea, unspecified: Secondary | ICD-10-CM | POA: Insufficient documentation

## 2012-05-12 DIAGNOSIS — Z860100 Personal history of colon polyps, unspecified: Secondary | ICD-10-CM | POA: Insufficient documentation

## 2012-05-12 DIAGNOSIS — E039 Hypothyroidism, unspecified: Secondary | ICD-10-CM | POA: Insufficient documentation

## 2012-05-12 DIAGNOSIS — Y9389 Activity, other specified: Secondary | ICD-10-CM | POA: Insufficient documentation

## 2012-05-12 DIAGNOSIS — Y92410 Unspecified street and highway as the place of occurrence of the external cause: Secondary | ICD-10-CM | POA: Insufficient documentation

## 2012-05-12 MED ORDER — FLUORESCEIN SODIUM 1 MG OP STRP
1.0000 | ORAL_STRIP | Freq: Once | OPHTHALMIC | Status: DC
  Filled 2012-05-12: qty 1

## 2012-05-12 MED ORDER — TETRACAINE HCL 0.5 % OP SOLN
1.0000 [drp] | Freq: Once | OPHTHALMIC | Status: DC
  Filled 2012-05-12: qty 2

## 2012-05-12 MED ORDER — HYDROCODONE-ACETAMINOPHEN 5-325 MG PO TABS: 1.0000 | ORAL_TABLET | Freq: Four times a day (QID) | ORAL | Status: DC | PRN

## 2012-05-12 MED ORDER — CYCLOBENZAPRINE HCL 10 MG PO TABS: 10.0000 mg | ORAL_TABLET | Freq: Two times a day (BID) | ORAL | Status: DC | PRN

## 2012-05-12 NOTE — ED Notes (Signed)
Medications handed to Dr. Anitra Lauth
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
## 2012-05-19 ENCOUNTER — Ambulatory Visit (INDEPENDENT_AMBULATORY_CARE_PROVIDER_SITE_OTHER): Payer: Medicare Other | Admitting: Internal Medicine

## 2012-05-19 ENCOUNTER — Encounter: Payer: Self-pay | Admitting: Internal Medicine

## 2012-05-19 VITALS — BP 140/80 | HR 92 | Temp 97.8°F | Resp 20 | Wt 244.0 lb

## 2012-05-19 DIAGNOSIS — IMO0002 Reserved for concepts with insufficient information to code with codable children: Secondary | ICD-10-CM

## 2012-05-19 DIAGNOSIS — Z23 Encounter for immunization: Secondary | ICD-10-CM

## 2012-05-19 MED ORDER — HYDROCODONE-ACETAMINOPHEN 5-325 MG PO TABS: 1.0000 | ORAL_TABLET | Freq: Four times a day (QID) | ORAL | Status: DC | PRN

## 2012-05-19 MED ORDER — CYCLOBENZAPRINE HCL 10 MG PO TABS: 10.0000 mg | ORAL_TABLET | Freq: Two times a day (BID) | ORAL | Status: DC | PRN
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
## 2012-05-19 NOTE — Patient Instructions (Signed)
You  may move around, but avoid painful motions and activities.  Apply  Heat to the sore area for 15 to 20 minutes 3 or 4 times daily for the next two to 3 days.  Take Aleve 200 mg twice daily for pain or swelling

## 2012-05-28 ENCOUNTER — Telehealth: Payer: Self-pay | Admitting: Internal Medicine
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
## 2012-05-28 NOTE — Telephone Encounter (Signed)
Spoke to pt told her I will have Dr. Amador Cunas fill out form and call her when ready for pick up. Pt verbalized understanding.

## 2012-05-29 NOTE — Telephone Encounter (Signed)
Pt calling back again re handicap permanent permit. It must be done by today, because she has to have the tag by 05/31/12. Please advise.

## 2012-05-29 NOTE — Telephone Encounter (Signed)
Called pt told her form is ready for pick up will be a front desk for her. Pt verbalized understanding.

## 2012-07-21 ENCOUNTER — Encounter: Payer: Medicare Other | Admitting: Internal Medicine

## 2012-07-23 ENCOUNTER — Encounter: Payer: Self-pay | Admitting: Internal Medicine

## 2012-07-23 ENCOUNTER — Ambulatory Visit (INDEPENDENT_AMBULATORY_CARE_PROVIDER_SITE_OTHER): Payer: Medicare Other | Admitting: Internal Medicine

## 2012-07-23 VITALS — BP 128/80 | Temp 98.1°F | Wt 250.0 lb

## 2012-07-23 DIAGNOSIS — IMO0002 Reserved for concepts with insufficient information to code with codable children: Secondary | ICD-10-CM

## 2012-07-23 DIAGNOSIS — R29898 Other symptoms and signs involving the musculoskeletal system: Secondary | ICD-10-CM

## 2012-07-23 DIAGNOSIS — M6250 Muscle wasting and atrophy, not elsewhere classified, unspecified site: Secondary | ICD-10-CM

## 2012-07-23 DIAGNOSIS — E039 Hypothyroidism, unspecified: Secondary | ICD-10-CM

## 2012-07-23 NOTE — Patient Instructions (Signed)
Physical therapy as discussed  Limit your sodium (Salt) intake    It is important that you exercise regularly, at least 20 minutes 3 to 4 times per week.  If you develop chest pain or shortness of breath seek  medical attention.  Please check your blood pressure on a regular basis.  If it is consistently greater than 150/90, please make an office appointment.  Return in 6 months for follow-up
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
## 2012-07-29 ENCOUNTER — Telehealth: Payer: Self-pay | Admitting: *Deleted

## 2012-07-29 NOTE — Telephone Encounter (Signed)
Pt called regarding refill for pain medication from visit 3/6 not at pharmacy. Told pt there were no refills sent. Asked pt what she needs? Pt stated the Hydrocodone but she told Dr.Kwiatkowski that it was not helping much and that she was taking two at times. Asked pt if she is taking Ibuprofen for the pain? Pt stated sometimes. Told pt will discuss pain med with Dr. Amador Cunas tomorrow and get back to her he has left for the day. Pt verbalized understanding.

## 2012-07-30 NOTE — Telephone Encounter (Signed)
Okay to refill hydrocodone #60 if needed Continue ibuprofen Continue physical therapy consultation

## 2012-07-30 NOTE — Telephone Encounter (Signed)
Pt would like something for pain states the Hydrocodone is not helping much. Please advise?

## 2012-08-06 MED ORDER — HYDROCODONE-ACETAMINOPHEN 5-325 MG PO TABS: 1.0000 | ORAL_TABLET | Freq: Four times a day (QID) | ORAL | Status: DC | PRN

## 2012-08-06 NOTE — Telephone Encounter (Signed)
Spoke to pt told her can refill Hydrocodone same strength but needs to continue Ibuprofen as needed per Dr. Amador Cunas. Pt verbalized understanding. Asked pt if she was contacted about PT? Pt stated yes she is there now. Told pt good will call Rx into pharmacy. Pt verbalized understanding.

## 2012-08-06 NOTE — Addendum Note (Signed)
Addended by: Jimmye Norman on: 08/06/2012 11:10 AM   Modules accepted: Orders

## 2012-09-14 ENCOUNTER — Ambulatory Visit (INDEPENDENT_AMBULATORY_CARE_PROVIDER_SITE_OTHER): Payer: Medicare Other | Admitting: Internal Medicine

## 2012-09-14 ENCOUNTER — Encounter: Payer: Self-pay | Admitting: Internal Medicine

## 2012-09-14 VITALS — BP 140/80 | HR 68 | Temp 97.8°F | Resp 20 | Ht 60.0 in | Wt 252.0 lb

## 2012-09-14 DIAGNOSIS — IMO0002 Reserved for concepts with insufficient information to code with codable children: Secondary | ICD-10-CM

## 2012-09-14 DIAGNOSIS — G4733 Obstructive sleep apnea (adult) (pediatric): Secondary | ICD-10-CM | POA: Insufficient documentation

## 2012-09-14 DIAGNOSIS — Z0000 Encounter for general adult medical examination without abnormal findings: Secondary | ICD-10-CM

## 2012-09-14 DIAGNOSIS — Z86010 Personal history of colonic polyps: Secondary | ICD-10-CM

## 2012-09-14 DIAGNOSIS — E039 Hypothyroidism, unspecified: Secondary | ICD-10-CM

## 2012-09-14 LAB — LIPID PANEL
Cholesterol: 216 mg/dL — ABNORMAL HIGH (ref 0–200)
Total CHOL/HDL Ratio: 4
VLDL: 20.4 mg/dL (ref 0.0–40.0)

## 2012-09-14 LAB — CBC WITH DIFFERENTIAL/PLATELET
Basophils Absolute: 0 10*3/uL (ref 0.0–0.1)
Eosinophils Absolute: 0.1 10*3/uL (ref 0.0–0.7)
HCT: 42.6 % (ref 36.0–46.0)
Hemoglobin: 14.5 g/dL (ref 12.0–15.0)
Lymphocytes Relative: 43.9 % (ref 12.0–46.0)
Lymphs Abs: 3.4 10*3/uL (ref 0.7–4.0)
MCHC: 34 g/dL (ref 30.0–36.0)
Neutro Abs: 3.7 10*3/uL (ref 1.4–7.7)
RDW: 13.1 % (ref 11.5–14.6)

## 2012-09-14 LAB — COMPREHENSIVE METABOLIC PANEL
ALT: 45 U/L — ABNORMAL HIGH (ref 0–35)
AST: 51 U/L — ABNORMAL HIGH (ref 0–37)
Calcium: 9.2 mg/dL (ref 8.4–10.5)
Chloride: 100 mEq/L (ref 96–112)
Creatinine, Ser: 0.8 mg/dL (ref 0.4–1.2)
Potassium: 4.2 mEq/L (ref 3.5–5.1)
Sodium: 138 mEq/L (ref 135–145)

## 2012-09-14 MED ORDER — HYDROCODONE-ACETAMINOPHEN 5-325 MG PO TABS: 1.0000 | ORAL_TABLET | Freq: Four times a day (QID) | ORAL | Status: DC | PRN

## 2012-09-14 MED ORDER — CYCLOBENZAPRINE HCL 10 MG PO TABS: 10.0000 mg | ORAL_TABLET | Freq: Two times a day (BID) | ORAL | Status: DC | PRN

## 2012-09-14 MED ORDER — LISINOPRIL-HYDROCHLOROTHIAZIDE 20-25 MG PO TABS: 1.0000 | ORAL_TABLET | Freq: Every day | ORAL | Status: DC

## 2012-09-14 NOTE — Patient Instructions (Signed)
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
## 2012-12-21 ENCOUNTER — Telehealth: Payer: Self-pay | Admitting: Internal Medicine

## 2012-12-21 DIAGNOSIS — T148XXA Other injury of unspecified body region, initial encounter: Secondary | ICD-10-CM
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
## 2012-12-22 NOTE — Telephone Encounter (Signed)
Please advise if okay to do referral for Neurologist?

## 2012-12-23 NOTE — Telephone Encounter (Signed)
Ok to schedule.

## 2012-12-23 NOTE — Telephone Encounter (Signed)
Spoke to pt told her referral was sent for Neurologist and someone will be contacting her. Pt verbalized understanding.

## 2012-12-23 NOTE — Telephone Encounter (Signed)
Tried to contact pt number not working at present will try again later.

## 2013-01-19 ENCOUNTER — Ambulatory Visit (INDEPENDENT_AMBULATORY_CARE_PROVIDER_SITE_OTHER): Payer: Medicare Other | Admitting: Neurology

## 2013-01-19 ENCOUNTER — Other Ambulatory Visit: Payer: Self-pay | Admitting: Internal Medicine

## 2013-01-19 ENCOUNTER — Encounter: Payer: Self-pay | Admitting: Neurology

## 2013-01-19 VITALS — BP 118/80 | HR 76 | Temp 97.9°F | Ht 60.5 in | Wt 256.0 lb

## 2013-01-19 DIAGNOSIS — H5710 Ocular pain, unspecified eye: Secondary | ICD-10-CM

## 2013-01-19 DIAGNOSIS — H5712 Ocular pain, left eye: Secondary | ICD-10-CM

## 2013-01-19 MED ORDER — GABAPENTIN 100 MG PO CAPS: 100.0000 mg | ORAL_CAPSULE | Freq: Three times a day (TID) | ORAL | Status: DC
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
## 2013-02-05 ENCOUNTER — Encounter: Payer: Self-pay | Admitting: Neurology

## 2013-02-05 ENCOUNTER — Ambulatory Visit (INDEPENDENT_AMBULATORY_CARE_PROVIDER_SITE_OTHER): Payer: Medicare Other | Admitting: Neurology

## 2013-02-05 VITALS — BP 130/86 | HR 78 | Temp 97.9°F | Ht 60.0 in | Wt 259.0 lb

## 2013-02-05 DIAGNOSIS — H5710 Ocular pain, unspecified eye: Secondary | ICD-10-CM

## 2013-02-05 DIAGNOSIS — H5712 Ocular pain, left eye: Secondary | ICD-10-CM

## 2013-02-05 MED ORDER — NORTRIPTYLINE HCL 10 MG PO CAPS: 10.0000 mg | ORAL_CAPSULE | Freq: Every day | ORAL | Status: DC
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
## 2013-02-05 NOTE — Patient Instructions (Signed)
1.  Stop the gabapentin 2.  Start nortriptyline 10mg  at bedtime.  Side effects include sleepiness and dizziness 3.  Follow up as already scheduled. 4.  Call in 4 weeks with an update and we can change the dose if needed.  Call sooner if you have any side effects.

## 2013-02-23 ENCOUNTER — Encounter: Payer: Self-pay | Admitting: Internal Medicine

## 2013-02-23 ENCOUNTER — Ambulatory Visit (INDEPENDENT_AMBULATORY_CARE_PROVIDER_SITE_OTHER): Payer: Medicare Other | Admitting: Internal Medicine

## 2013-02-23 DIAGNOSIS — E039 Hypothyroidism, unspecified: Secondary | ICD-10-CM

## 2013-02-23 DIAGNOSIS — IMO0002 Reserved for concepts with insufficient information to code with codable children: Secondary | ICD-10-CM

## 2013-02-23 DIAGNOSIS — Z23 Encounter for immunization: Secondary | ICD-10-CM

## 2013-02-23 DIAGNOSIS — G4733 Obstructive sleep apnea (adult) (pediatric): Secondary | ICD-10-CM

## 2013-02-23 MED ORDER — HYDROCODONE-ACETAMINOPHEN 5-325 MG PO TABS: ORAL_TABLET | ORAL | Status: DC
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
## 2013-02-23 NOTE — Patient Instructions (Signed)
Limit your sodium (Salt) intake    It is important that you exercise regularly, at least 20 minutes 3 to 4 times per week.  If you develop chest pain or shortness of breath seek  medical attention.  You need to lose weight.  Consider a lower calorie diet and regular exercise.  Return in 6 months for follow-up   

## 2013-03-23 ENCOUNTER — Ambulatory Visit (INDEPENDENT_AMBULATORY_CARE_PROVIDER_SITE_OTHER): Payer: Medicare Other | Admitting: Neurology

## 2013-03-23 ENCOUNTER — Encounter: Payer: Self-pay | Admitting: Neurology

## 2013-03-23 VITALS — BP 160/70 | HR 84 | Temp 98.7°F | Ht 60.0 in | Wt 259.0 lb

## 2013-03-23 DIAGNOSIS — H5712 Ocular pain, left eye: Secondary | ICD-10-CM

## 2013-03-23 DIAGNOSIS — H5710 Ocular pain, unspecified eye: Secondary | ICD-10-CM

## 2013-03-23 MED ORDER — NORTRIPTYLINE HCL 10 MG PO CAPS: 10.0000 mg | ORAL_CAPSULE | Freq: Every day | ORAL | Status: DC

## 2013-03-23 NOTE — Patient Instructions (Signed)
Continue nortriptyline 10mg  at bedtime.  Follow up in 6 months.  Call with questions or concerns.
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
## 2013-08-24 ENCOUNTER — Encounter: Payer: Self-pay | Admitting: Internal Medicine

## 2013-08-24 ENCOUNTER — Ambulatory Visit (INDEPENDENT_AMBULATORY_CARE_PROVIDER_SITE_OTHER): Payer: Medicare HMO | Admitting: Internal Medicine

## 2013-08-24 DIAGNOSIS — E039 Hypothyroidism, unspecified: Secondary | ICD-10-CM

## 2013-08-24 DIAGNOSIS — IMO0002 Reserved for concepts with insufficient information to code with codable children: Secondary | ICD-10-CM

## 2013-08-24 DIAGNOSIS — G4733 Obstructive sleep apnea (adult) (pediatric): Secondary | ICD-10-CM

## 2013-08-24 MED ORDER — HYDROCODONE-ACETAMINOPHEN 5-325 MG PO TABS: ORAL_TABLET | ORAL | Status: DC

## 2013-08-24 MED ORDER — EPINEPHRINE 0.3 MG/0.3ML IJ SOAJ: 0.3000 mg | Freq: Once | INTRAMUSCULAR | Status: DC

## 2013-08-24 NOTE — Progress Notes (Signed)
Pre-visit discussion using our clinic review tool. No additional management support is needed unless otherwise documented below in the visit note.  
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
## 2013-08-24 NOTE — Patient Instructions (Signed)
Limit your sodium (Salt) intake    It is important that you exercise regularly, at least 20 minutes 3 to 4 times per week.  If you develop chest pain or shortness of breath seek  medical attention.  You need to lose weight.  Consider a lower calorie diet and regular exercise.  Return in 6 months for follow-up   

## 2013-08-25 ENCOUNTER — Telehealth: Payer: Self-pay | Admitting: Internal Medicine

## 2013-08-25 NOTE — Telephone Encounter (Signed)
Relevant patient education mailed to patient.  

## 2013-09-21 ENCOUNTER — Encounter: Payer: Self-pay | Admitting: Neurology

## 2013-09-21 ENCOUNTER — Ambulatory Visit (INDEPENDENT_AMBULATORY_CARE_PROVIDER_SITE_OTHER): Payer: Medicare HMO | Admitting: Neurology

## 2013-09-21 VITALS — BP 130/72 | HR 72 | Temp 97.5°F | Resp 16 | Ht 60.0 in | Wt 258.3 lb

## 2013-09-21 DIAGNOSIS — H5712 Ocular pain, left eye: Secondary | ICD-10-CM

## 2013-09-21 DIAGNOSIS — H5710 Ocular pain, unspecified eye: Secondary | ICD-10-CM

## 2013-09-21 DIAGNOSIS — IMO0002 Reserved for concepts with insufficient information to code with codable children: Secondary | ICD-10-CM

## 2013-09-21 DIAGNOSIS — M792 Neuralgia and neuritis, unspecified: Secondary | ICD-10-CM
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
## 2013-09-21 NOTE — Patient Instructions (Signed)
1.  Continue nortriptyline 10mg  at bedtime. 2.  Follow up in 6 months

## 2014-02-03 ENCOUNTER — Telehealth: Payer: Self-pay | Admitting: Neurology

## 2014-02-03 NOTE — Telephone Encounter (Signed)
Pt called to cancel her f/u appt on 03/22/14 w/ Dr. Tomi Likens. She states she is feeling better.

## 2014-02-23 ENCOUNTER — Encounter: Payer: Medicare HMO | Admitting: Internal Medicine

## 2014-02-25 ENCOUNTER — Encounter: Payer: Self-pay | Admitting: Internal Medicine

## 2014-02-25 ENCOUNTER — Ambulatory Visit (INDEPENDENT_AMBULATORY_CARE_PROVIDER_SITE_OTHER): Payer: Medicare HMO | Admitting: Internal Medicine

## 2014-02-25 VITALS — BP 164/110 | HR 76 | Temp 98.1°F | Ht 60.0 in | Wt 262.0 lb

## 2014-02-25 DIAGNOSIS — I10 Essential (primary) hypertension: Secondary | ICD-10-CM

## 2014-02-25 DIAGNOSIS — G4733 Obstructive sleep apnea (adult) (pediatric): Secondary | ICD-10-CM

## 2014-02-25 DIAGNOSIS — Z860100 Personal history of colon polyps, unspecified: Secondary | ICD-10-CM

## 2014-02-25 DIAGNOSIS — Z0000 Encounter for general adult medical examination without abnormal findings: Secondary | ICD-10-CM

## 2014-02-25 DIAGNOSIS — E034 Atrophy of thyroid (acquired): Secondary | ICD-10-CM

## 2014-02-25 DIAGNOSIS — E038 Other specified hypothyroidism: Secondary | ICD-10-CM

## 2014-02-25 DIAGNOSIS — Z23 Encounter for immunization: Secondary | ICD-10-CM

## 2014-02-25 LAB — CBC WITH DIFFERENTIAL/PLATELET
BASOS PCT: 0.6 % (ref 0.0–3.0)
Basophils Absolute: 0 10*3/uL (ref 0.0–0.1)
EOS PCT: 2.4 % (ref 0.0–5.0)
Eosinophils Absolute: 0.2 10*3/uL (ref 0.0–0.7)
HCT: 45 % (ref 36.0–46.0)
Hemoglobin: 14.8 g/dL (ref 12.0–15.0)
LYMPHS ABS: 2.6 10*3/uL (ref 0.7–4.0)
Lymphocytes Relative: 40.8 % (ref 12.0–46.0)
MCHC: 32.9 g/dL (ref 30.0–36.0)
MCV: 98.2 fl (ref 78.0–100.0)
MONO ABS: 0.6 10*3/uL (ref 0.1–1.0)
MONOS PCT: 8.9 % (ref 3.0–12.0)
Neutro Abs: 3 10*3/uL (ref 1.4–7.7)
Neutrophils Relative %: 47.3 % (ref 43.0–77.0)
PLATELETS: 178 10*3/uL (ref 150.0–400.0)
RBC: 4.59 Mil/uL (ref 3.87–5.11)
RDW: 12.6 % (ref 11.5–15.5)
WBC: 6.4 10*3/uL (ref 4.0–10.5)

## 2014-02-25 LAB — COMPREHENSIVE METABOLIC PANEL
ALK PHOS: 96 U/L (ref 39–117)
ALT: 77 U/L — ABNORMAL HIGH (ref 0–35)
AST: 82 U/L — ABNORMAL HIGH (ref 0–37)
Albumin: 3.1 g/dL — ABNORMAL LOW (ref 3.5–5.2)
BILIRUBIN TOTAL: 0.5 mg/dL (ref 0.2–1.2)
BUN: 6 mg/dL (ref 6–23)
CO2: 29 meq/L (ref 19–32)
Calcium: 9.2 mg/dL (ref 8.4–10.5)
Chloride: 104 mEq/L (ref 96–112)
Creatinine, Ser: 0.6 mg/dL (ref 0.4–1.2)
GFR: 105 mL/min (ref >60.00)
GLUCOSE: 104 mg/dL — AB (ref 70–99)
Potassium: 4.8 mEq/L (ref 3.5–5.1)
SODIUM: 138 meq/L (ref 135–145)
TOTAL PROTEIN: 7.8 g/dL (ref 6.0–8.3)

## 2014-02-25 LAB — LIPID PANEL
CHOLESTEROL: 193 mg/dL (ref 0–200)
HDL: 41.4 mg/dL (ref >39.00)
NonHDL: 151.6
Total CHOL/HDL Ratio: 5
Triglycerides: 212 mg/dL — ABNORMAL HIGH (ref 0.0–149.0)
VLDL: 42.4 mg/dL — ABNORMAL HIGH (ref 0.0–40.0)

## 2014-02-25 LAB — TSH: TSH: 3.88 u[IU]/mL (ref 0.35–4.50)

## 2014-02-25 LAB — LDL CHOLESTEROL, DIRECT: LDL DIRECT: 117.1 mg/dL

## 2014-02-25 NOTE — Progress Notes (Signed)
Pre visit review using our clinic review tool, if applicable. No additional management support is needed unless otherwise documented below in the visit note. 

## 2014-02-25 NOTE — Progress Notes (Signed)
Subjective:    Patient ID: Jasmine Hood, female    DOB: 07/18/1945, 68 y.o.   MRN: [id number]  HPI  History of Present Illness:  68 -year-old patient who is seen today for a wellness exam.  Medical problems  include morbid obesity, hypertension, and colonic polyps.  She has had hypothyroidism diagnosed and treated recently.  BP Readings from Last 3 Encounters:  02/25/14 164/110  09/21/13 130/72  08/24/13 140/90    Concerns or complaints.  Weight is unchanged over the past year.  Denies any cardiopulmonary complaints  Here for Medicare AWV:  1. Risk factors based on Past M, S, F history: cardiovascular risk factors include exogenous  obesity and labile hypertension as well as a family history of premature cardiac disease 2. Physical Activities: sedentary 3. Depression/mood: no history of depression or mood disorder 4. Hearing: no deficits 5. ADL's: independent in all aspects of daily living 6. Fall Risk: moderate due to obesity 7. Home Safety: no problems identified  8. Height, weight, &visual acuity:height and weight stable.  No difficulty with visual acuity 9. Counseling: heart healthy diet, weight loss, exercise, all encouraged 10. Labs ordered based on risk factors: laboratory profile, including lipid panel, and TSH reviewed 11.       Referral Coordination- needs mammogram 12.       Care Plan- follow-up mammogram, calcium and vitamin D supplementation, weight loss, and exercise.  Encouraged 13.       Cognitive Assessment-  alert and oriented, with normal affect.  No cognitive dysfunction.  No difficulty handling all executive functions 14.  Preventive services include colonoscopies every 5 years as well as annual health examination.  Patient will continue to track home blood pressure readings and report any consistent elevations.  Patient provided with a written and personalized  care plan   Allergies (verified):  No Known Drug Allergies  Past History:  Past Medical  History: impaired glucose tolerance exogenous obesity hypertension history of obstructive sleep apnea Colonic polyps, hx of bee sting allergy Hypothyroidism  Past Surgical History:  gravida 5, para 5 aborta zero Tubal ligation skin grafting, right anterior lower leg, secondary to brown recluse spider bite, 1998  Family History:  father died age 70, throat cancer mother died age 70 breast cancer one half brother in good health.  Two half sisters in good health 3 sisters positive for osteoarthritis, RA, ALD  Social History:  Divorced Never Smoked    Review of Systems  Constitutional: Negative for fever, appetite change, fatigue and unexpected weight change.  HENT: Negative for congestion, dental problem, ear pain, hearing loss, mouth sores, nosebleeds, sinus pressure, sore throat, tinnitus, trouble swallowing and voice change.   Eyes: Negative for photophobia, pain, redness and visual disturbance.  Respiratory: Negative for cough, chest tightness and shortness of breath.   Cardiovascular: Negative for chest pain, palpitations and leg swelling.  Gastrointestinal: Negative for nausea, vomiting, abdominal pain, diarrhea, constipation, blood in stool, abdominal distention and rectal pain.  Genitourinary: Negative for dysuria, urgency, frequency, hematuria, flank pain, vaginal bleeding, vaginal discharge, difficulty urinating, genital sores, vaginal pain, menstrual problem and pelvic pain.  Musculoskeletal: Positive for back pain and gait problem. Negative for arthralgias and neck stiffness.  Skin: Negative for rash.  Neurological: Negative for dizziness, syncope, speech difficulty, weakness, light-headedness, numbness and headaches.  Hematological: Negative for adenopathy. Does not bruise/bleed easily.  Psychiatric/Behavioral: Negative for suicidal ideas, behavioral problems, self-injury, dysphoric mood and agitation. The patient is not nervous/anxious.  Objective:    Physical Exam  Constitutional: She is oriented to person, place, and time. She appears well-developed and well-nourished.  Blood pressure 142/82 Weight 262  HENT:  Head: Normocephalic and atraumatic.  Right Ear: External ear normal.  Left Ear: External ear normal.  Mouth/Throat: Oropharynx is clear and moist.  Pharyngeal crowding with low hanging soft palate  Eyes: Conjunctivae and EOM are normal.  Neck: Normal range of motion. Neck supple. No JVD present. No thyromegaly present.  Cardiovascular: Normal rate, regular rhythm, normal heart sounds and intact distal pulses.   No murmur heard. Pulmonary/Chest: Effort normal and breath sounds normal. She has no wheezes. She has no rales.  Abdominal: Soft. Bowel sounds are normal. She exhibits no distension and no mass. There is no tenderness. There is no rebound and no guarding.  Musculoskeletal: Normal range of motion. She exhibits no edema and no tenderness.  Neurological: She is alert and oriented to person, place, and time. She has normal reflexes. No cranial nerve deficit. She exhibits normal muscle tone. Coordination normal.  Skin: Skin is warm and dry. No rash noted.  Psychiatric: She has a normal mood and affect. Her behavior is normal.          Assessment & Plan:   Preventive health examination Morbid obesity. Weight loss encouraged Low back pain.  Stable Hypertension- blood pressure is a bit labile today, and elevated on arrival.  She states home blood pressure monitoring reveals normal readings.  We'll continue to track more closely History colonic polyps Hypothyroidism we'll check a TSH  Recheck 6 months

## 2014-02-25 NOTE — Patient Instructions (Signed)
Limit your sodium (Salt) intake  Please check your blood pressure on a regular basis.  If it is consistently greater than 150/90, please make an office appointment.  It  is important that you exercise regularly, at least 20 minutes 3 to 4 times per week.  If you develop chest pain or shortness of breath seek  medical attention.  You need to lose weight.  Consider a lower calorie diet and regular exercise.  Return in 6 months for follow-up Cardiac Diet This diet can help prevent heart disease and stroke. Many factors influence your heart health, including eating and exercise habits. Coronary risk rises a lot with abnormal blood fat (lipid) levels. Cardiac meal planning includes limiting unhealthy fats, increasing healthy fats, and making other small dietary changes. General guidelines are as follows:  Adjust calorie intake to reach and maintain desirable body weight.  Limit total fat intake to less than 30% of total calories. Saturated fat should be less than 7% of calories.  Saturated fats are found in animal products and in some vegetable products. Saturated vegetable fats are found in coconut oil, cocoa butter, palm oil, and palm kernel oil. Read labels carefully to avoid these products as much as possible. Use butter in moderation. Choose tub margarines and oils that have 2 grams of fat or less. Good cooking oils are canola and olive oils.  Practice low-fat cooking techniques. Do not fry food. Instead, broil, bake, boil, steam, grill, roast on a rack, stir-fry, or microwave it. Other fat reducing suggestions include:  Remove the skin from poultry.  Remove all visible fat from meats.  Skim the fat off stews, soups, and gravies before serving them.  Steam vegetables in water or broth instead of sauting them in fat.  Avoid foods with trans fat (or hydrogenated oils), such as commercially fried foods and commercially baked goods. Commercial shortening and deep-frying fats will contain  trans fat.  Increase intake of fruits, vegetables, whole grains, and legumes to replace foods high in fat.  Increase consumption of nuts, legumes, and seeds to at least 4 servings weekly. One serving of a legume equals  cup, and 1 serving of nuts or seeds equals  cup.  Choose whole grains more often. Have 3 servings per day (a serving is 1 ounce [oz]).  Eat 4 to 5 servings of vegetables per day. A serving of vegetables is 1 cup of raw leafy vegetables;  cup of raw or cooked cut-up vegetables;  cup of vegetable juice.  Eat 4 to 5 servings of fruit per day. A serving of fruit is 1 medium whole fruit;  cup of dried fruit;  cup of fresh, frozen, or canned fruit;  cup of 100% fruit juice.  Increase your intake of dietary fiber to 20 to 30 grams per day. Insoluble fiber may help lower your risk of heart disease and may help curb your appetite.  Soluble fiber binds cholesterol to be removed from the blood. Foods high in soluble fiber are dried beans, citrus fruits, oats, apples, bananas, broccoli, Brussels sprouts, and eggplant.  Try to include foods fortified with plant sterols or stanols, such as yogurt, breads, juices, or margarines. Choose several fortified foods to achieve a daily intake of 2 to 3 grams of plant sterols or stanols.  Foods with omega-3 fats can help reduce your risk of heart disease. Aim to have a 3.5 oz portion of fatty fish twice per week, such as salmon, mackerel, albacore tuna, sardines, lake trout, or herring. If you  wish to take a fish oil supplement, choose one that contains 1 gram of both DHA and EPA.  Limit processed meats to 2 servings (3 oz portion) weekly.  Limit the sodium in your diet to 1500 milligrams (mg) per day. If you have high blood pressure, talk to a registered dietitian about a DASH (Dietary Approaches to Stop Hypertension) eating plan.  Limit sweets and beverages with added sugar, such as soda, to no more than 5 servings per week. One serving is:    1 tablespoon sugar.  1 tablespoon jelly or jam.   cup sorbet.  1 cup lemonade.   cup regular soda. CHOOSING FOODS Starches  Allowed: Breads: All kinds (wheat, rye, raisin, white, oatmeal, [place], [place], and English muffin bread). Low-fat rolls: English muffins, frankfurter and hamburger buns, bagels, pita bread, tortillas (not fried). Pancakes, waffles, biscuits, and muffins made with recommended oil.  Avoid: Products made with saturated or trans fats, oils, or whole milk products. Butter rolls, cheese breads, croissants. Commercial doughnuts, muffins, sweet rolls, biscuits, waffles, pancakes, store-bought mixes. Crackers  Allowed: Low-fat crackers and snacks: Animal, graham, rye, saltine (with recommended oil, no lard), oyster, and matzo crackers. Bread sticks, melba toast, rusks, flatbread, pretzels, and light popcorn.  Avoid: High-fat crackers: cheese crackers, butter crackers, and those made with coconut, palm oil, or trans fat (hydrogenated oils). Buttered popcorn. Cereals  Allowed: Hot or cold whole-grain cereals.  Avoid: Cereals containing coconut, hydrogenated vegetable fat, or animal fat. Potatoes / Pasta / Rice  Allowed: All kinds of potatoes, rice, and pasta (such as macaroni, spaghetti, and noodles).  Avoid: Pasta or rice prepared with cream sauce or high-fat cheese. Chow mein noodles, [place] fries. Vegetables  Allowed: All vegetables and vegetable juices.  Avoid: Fried vegetables. Vegetables in cream, butter, or high-fat cheese sauces. Limit coconut. Fruit in cream or custard. Protein  Allowed: Limit your intake of meat, seafood, and poultry to no more than 6 oz (cooked weight) per day. All lean, well-trimmed beef, veal, pork, and lamb. All chicken and [place] without skin. All fish and shellfish. Wild game: wild duck, rabbit, pheasant, and venison. Egg whites or low-cholesterol egg substitutes may be used as desired. Meatless dishes: recipes with dried  beans, peas, lentils, and tofu (soybean curd). Seeds and nuts: all seeds and most nuts.  Avoid: Prime grade and other heavily marbled and fatty meats, such as short ribs, spare ribs, rib eye roast or steak, frankfurters, sausage, bacon, and high-fat luncheon meats, mutton. Caviar. Commercially fried fish. Domestic duck, goose, venison sausage. Organ meats: liver, gizzard, heart, chitterlings, brains, kidney, sweetbreads. Dairy  Allowed: Low-fat cheeses: nonfat or low-fat cottage cheese (1% or 2% fat), cheeses made with part skim milk, such as mozzarella, farmers, string, or ricotta. (Cheeses should be labeled no more than 2 to 6 grams fat per oz.). Skim (or 1%) milk: liquid, powdered, or evaporated. Buttermilk made with low-fat milk. Drinks made with skim or low-fat milk or cocoa. Chocolate milk or cocoa made with skim or low-fat (1%) milk. Nonfat or low-fat yogurt.  Avoid: Whole milk cheeses, including colby, cheddar, [place], Monterey Jack, [place], [place], [place], American, Swiss, and blue. Creamed cottage cheese, cream cheese. Whole milk and whole milk products, including buttermilk or yogurt made from whole milk, drinks made from whole milk. Condensed milk, evaporated whole milk, and 2% milk. Soups and Combination Foods  Allowed: Low-fat low-sodium soups: broth, dehydrated soups, homemade broth, soups with the fat removed, homemade cream soups made with skim or low-fat milk. Low-fat  spaghetti, lasagna, chili, and Spanish rice if low-fat ingredients and low-fat cooking techniques are used.  Avoid: Cream soups made with whole milk, cream, or high-fat cheese. All other soups. Desserts and Sweets  Allowed: Sherbet, fruit ices, gelatins, meringues, and angel food cake. Homemade desserts with recommended fats, oils, and milk products. Jam, jelly, honey, marmalade, sugars, and syrups. Pure sugar candy, such as gum drops, hard candy, jelly beans, marshmallows, mints, and small amounts of dark  chocolate.  Avoid: Commercially prepared cakes, pies, cookies, frosting, pudding, or mixes for these products. Desserts containing whole milk products, chocolate, coconut, lard, palm oil, or palm kernel oil. Ice cream or ice cream drinks. Candy that contains chocolate, coconut, butter, hydrogenated fat, or unknown ingredients. Buttered syrups. Fats and Oils  Allowed: Vegetable oils: safflower, sunflower, corn, soybean, cottonseed, sesame, canola, olive, or peanut. Non-hydrogenated margarines. Salad dressing or mayonnaise: homemade or commercial, made with a recommended oil. Low or nonfat salad dressing or mayonnaise.  Limit added fats and oils to 6 to 8 tsp per day (includes fats used in cooking, baking, salads, and spreads on bread). Remember to count the "hidden fats" in foods.  Avoid: Solid fats and shortenings: butter, lard, salt pork, bacon drippings. Gravy containing meat fat, shortening, or suet. Cocoa butter, coconut. Coconut oil, palm oil, palm kernel oil, or hydrogenated oils: these ingredients are often used in bakery products, nondairy creamers, whipped toppings, candy, and commercially fried foods. Read labels carefully. Salad dressings made of unknown oils, sour cream, or cheese, such as blue cheese and Roquefort. Cream, all kinds: half-and-half, light, heavy, or whipping. Sour cream or cream cheese (even if "light" or low-fat). Nondairy cream substitutes: coffee creamers and sour cream substitutes made with palm, palm kernel, hydrogenated oils, or coconut oil. Beverages  Allowed: Coffee (regular or decaffeinated), tea. Diet carbonated beverages, mineral water. Alcohol: Check with your caregiver. Moderation is recommended.  Avoid: Whole milk, regular sodas, and juice drinks with added sugar. Condiments  Allowed: All seasonings and condiments. Cocoa powder. "Cream" sauces made with recommended ingredients.  Avoid: Carob powder made with hydrogenated fats. SAMPLE MENU Breakfast    cup orange juice   cup oatmeal  1 slice toast  1 tsp margarine  1 cup skim milk Lunch  [place] sandwich with 2 oz [place], 2 slices bread  Lettuce and tomato slices  Fresh fruit  Carrot sticks  Coffee or tea Snack  Fresh fruit or low-fat crackers Dinner  3 oz lean ground beef  1 baked potato  1 tsp margarine   cup asparagus  Lettuce salad  1 tbs non-creamy dressing   cup peach slices  1 cup skim milk Document Released: 02/13/2008 Document Revised: 11/05/2011 Document Reviewed: 07/06/2013 [employer] Patient Information 2015 [place], [place]. This information is not intended to replace advice given to you by your health care provider. Make sure you discuss any questions you have with your health care provider. Health Maintenance Adopting a healthy lifestyle and getting preventive care can go a long way to promote health and wellness. Talk with your health care provider about what schedule of regular examinations is right for you. This is a good chance for you to check in with your provider about disease prevention and staying healthy. In between checkups, there are plenty of things you can do on your own. Experts have done a lot of research about which lifestyle changes and preventive measures are most likely to keep you healthy. Ask your health care provider for more information. WEIGHT AND DIET  Eat a healthy diet  Be sure to include plenty of vegetables, fruits, low-fat dairy products, and lean protein.  Do not eat a lot of foods high in solid fats, added sugars, or salt.  Get regular exercise. This is one of the most important things you can do for your health.  Most adults should exercise for at least 150 minutes each week. The exercise should increase your heart rate and make you sweat (moderate-intensity exercise).  Most adults should also do strengthening exercises at least twice a week. This is in addition to the moderate-intensity exercise.  Maintain a  healthy weight  Body mass index (BMI) is a measurement that can be used to identify possible weight problems. It estimates body fat based on height and weight. Your health care provider can help determine your BMI and help you achieve or maintain a healthy weight.  For females 47 years of age and older:   A BMI below 18.5 is considered underweight.  A BMI of 18.5 to 24.9 is normal.  A BMI of 25 to 29.9 is considered overweight.  A BMI of 30 and above is considered obese.  Watch levels of cholesterol and blood lipids  You should start having your blood tested for lipids and cholesterol at 70 years of age, then have this test every 5 years.  You may need to have your cholesterol levels checked more often if:  Your lipid or cholesterol levels are high.  You are older than 70 years of age.  You are at high risk for heart disease.  CANCER SCREENING   Lung Cancer  Lung cancer screening is recommended for adults 23-33 years old who are at high risk for lung cancer because of a history of smoking.  A yearly low-dose CT scan of the lungs is recommended for people who:  Currently smoke.  Have quit within the past 15 years.  Have at least a 30-pack-year history of smoking. A pack year is smoking an average of one pack of cigarettes a day for 1 year.  Yearly screening should continue until it has been 15 years since you quit.  Yearly screening should stop if you develop a health problem that would prevent you from having lung cancer treatment.  Breast Cancer  Practice breast self-awareness. This means understanding how your breasts normally appear and feel.  It also means doing regular breast self-exams. Let your health care provider know about any changes, no matter how small.  If you are in your 20s or 30s, you should have a clinical breast exam (CBE) by a health care provider every 1-3 years as part of a regular health exam.  If you are 41 or older, have a CBE every year.  Also consider having a breast X-ray (mammogram) every year.  If you have a family history of breast cancer, talk to your health care provider about genetic screening.  If you are at high risk for breast cancer, talk to your health care provider about having an MRI and a mammogram every year.  Breast cancer gene (BRCA) assessment is recommended for women who have family members with BRCA-related cancers. BRCA-related cancers include:  Breast.  Ovarian.  Tubal.  Peritoneal cancers.  Results of the assessment will determine the need for genetic counseling and BRCA1 and BRCA2 testing. Cervical Cancer Routine pelvic examinations to screen for cervical cancer are no longer recommended for nonpregnant women who are considered low risk for cancer of the pelvic organs (ovaries, uterus, and vagina) and who  do not have symptoms. A pelvic examination may be necessary if you have symptoms including those associated with pelvic infections. Ask your health care provider if a screening pelvic exam is right for you.   The Pap test is the screening test for cervical cancer for women who are considered at risk.  If you had a hysterectomy for a problem that was not cancer or a condition that could lead to cancer, then you no longer need Pap tests.  If you are older than 65 years, and you have had normal Pap tests for the past 10 years, you no longer need to have Pap tests.  If you have had past treatment for cervical cancer or a condition that could lead to cancer, you need Pap tests and screening for cancer for at least 20 years after your treatment.  If you no longer get a Pap test, assess your risk factors if they change (such as having a new sexual partner). This can affect whether you should start being screened again.  Some women have medical problems that increase their chance of getting cervical cancer. If this is the case for you, your health care provider may recommend more frequent screening and  Pap tests.  The human papillomavirus (HPV) test is another test that may be used for cervical cancer screening. The HPV test looks for the virus that can cause cell changes in the cervix. The cells collected during the Pap test can be tested for HPV.  The HPV test can be used to screen women 97 years of age and older. Getting tested for HPV can extend the interval between normal Pap tests from three to five years.  An HPV test also should be used to screen women of any age who have unclear Pap test results.  After 70 years of age, women should have HPV testing as often as Pap tests.  Colorectal Cancer  This type of cancer can be detected and often prevented.  Routine colorectal cancer screening usually begins at 70 years of age and continues through 70 years of age.  Your health care provider may recommend screening at an earlier age if you have risk factors for colon cancer.  Your health care provider may also recommend using home test kits to check for hidden blood in the stool.  A small camera at the end of a tube can be used to examine your colon directly (sigmoidoscopy or colonoscopy). This is done to check for the earliest forms of colorectal cancer.  Routine screening usually begins at age 40.  Direct examination of the colon should be repeated every 5-10 years through 70 years of age. However, you may need to be screened more often if early forms of precancerous polyps or small growths are found. Skin Cancer  Check your skin from head to toe regularly.  Tell your health care provider about any new moles or changes in moles, especially if there is a change in a mole's shape or color.  Also tell your health care provider if you have a mole that is larger than the size of a pencil eraser.  Always use sunscreen. Apply sunscreen liberally and repeatedly throughout the day.  Protect yourself by wearing long sleeves, pants, a wide-brimmed hat, and sunglasses whenever you are  outside. HEART DISEASE, DIABETES, AND HIGH BLOOD PRESSURE   Have your blood pressure checked at least every 1-2 years. High blood pressure causes heart disease and increases the risk of stroke.  If you are between 27  years and 13 years old, ask your health care provider if you should take aspirin to prevent strokes.  Have regular diabetes screenings. This involves taking a blood sample to check your fasting blood sugar level.  If you are at a normal weight and have a low risk for diabetes, have this test once every three years after 70 years of age.  If you are overweight and have a high risk for diabetes, consider being tested at a younger age or more often. PREVENTING INFECTION  Hepatitis B  If you have a higher risk for hepatitis B, you should be screened for this virus. You are considered at high risk for hepatitis B if:  You were born in a country where hepatitis B is common. Ask your health care provider which countries are considered high risk.  Your parents were born in a high-risk country, and you have not been immunized against hepatitis B (hepatitis B vaccine).  You have HIV or AIDS.  You use needles to inject street drugs.  You live with someone who has hepatitis B.  You have had sex with someone who has hepatitis B.  You get hemodialysis treatment.  You take certain medicines for conditions, including cancer, organ transplantation, and autoimmune conditions. Hepatitis C  Blood testing is recommended for:  Everyone born from [birth year] through 1967.  Anyone with known risk factors for hepatitis C. Sexually transmitted infections (STIs)  You should be screened for sexually transmitted infections (STIs) including gonorrhea and chlamydia if:  You are sexually active and are younger than 70 years of age.  You are older than 70 years of age and your health care provider tells you that you are at risk for this type of infection.  Your sexual activity has changed since  you were last screened and you are at an increased risk for chlamydia or gonorrhea. Ask your health care provider if you are at risk.  If you do not have HIV, but are at risk, it may be recommended that you take a prescription medicine daily to prevent HIV infection. This is called pre-exposure prophylaxis (PrEP). You are considered at risk if:  You are sexually active and do not regularly use condoms or know the HIV status of your partner(s).  You take drugs by injection.  You are sexually active with a partner who has HIV. Talk with your health care provider about whether you are at high risk of being infected with HIV. If you choose to begin PrEP, you should first be tested for HIV. You should then be tested every 3 months for as long as you are taking PrEP.  PREGNANCY   If you are premenopausal and you may become pregnant, ask your health care provider about preconception counseling.  If you may become pregnant, take 400 to 800 micrograms (mcg) of folic acid every day.  If you want to prevent pregnancy, talk to your health care provider about birth control (contraception). OSTEOPOROSIS AND MENOPAUSE   Osteoporosis is a disease in which the bones lose minerals and strength with aging. This can result in serious bone fractures. Your risk for osteoporosis can be identified using a bone density scan.  If you are 89 years of age or older, or if you are at risk for osteoporosis and fractures, ask your health care provider if you should be screened.  Ask your health care provider whether you should take a calcium or vitamin D supplement to lower your risk for osteoporosis.  Menopause may have certain  physical symptoms and risks.  Hormone replacement therapy may reduce some of these symptoms and risks. Talk to your health care provider about whether hormone replacement therapy is right for you.  HOME CARE INSTRUCTIONS   Schedule regular health, dental, and eye exams.  Stay current with  your immunizations.   Do not use any tobacco products including cigarettes, chewing tobacco, or electronic cigarettes.  If you are pregnant, do not drink alcohol.  If you are breastfeeding, limit how much and how often you drink alcohol.  Limit alcohol intake to no more than 1 drink per day for nonpregnant women. One drink equals 12 ounces of beer, 5 ounces of wine, or 1 ounces of hard liquor.  Do not use street drugs.  Do not share needles.  Ask your health care provider for help if you need support or information about quitting drugs.  Tell your health care provider if you often feel depressed.  Tell your health care provider if you have ever been abused or do not feel safe at home. Document Released: 11/19/2010 Document Revised: 09/20/2013 Document Reviewed: 04/07/2013 [facility] Patient Information 2015 [place], [place]. This information is not intended to replace advice given to you by your health care provider. Make sure you discuss any questions you have with your health care provider.

## 2014-02-28 ENCOUNTER — Other Ambulatory Visit: Payer: Self-pay | Admitting: Internal Medicine

## 2014-02-28 DIAGNOSIS — R748 Abnormal levels of other serum enzymes: Secondary | ICD-10-CM

## 2014-03-02 ENCOUNTER — Other Ambulatory Visit: Payer: Medicare HMO

## 2014-03-02 DIAGNOSIS — R748 Abnormal levels of other serum enzymes: Secondary | ICD-10-CM

## 2014-03-03 LAB — HEPATITIS C ANTIBODY: HCV Ab: NEGATIVE

## 2014-03-08 ENCOUNTER — Ambulatory Visit
Admission: RE | Admit: 2014-03-08 | Discharge: 2014-03-08 | Disposition: A | Discharge status: Home / Self Care | Payer: Medicare HMO | Source: Ambulatory Visit | Attending: Internal Medicine | Admitting: Internal Medicine

## 2014-03-08 DIAGNOSIS — R748 Abnormal levels of other serum enzymes: Secondary | ICD-10-CM

## 2014-03-22 ENCOUNTER — Ambulatory Visit: Payer: Medicare HMO | Admitting: Neurology

## 2014-08-26 ENCOUNTER — Ambulatory Visit: Payer: Medicare HMO | Admitting: Internal Medicine

## 2014-09-12 ENCOUNTER — Ambulatory Visit: Payer: Medicare HMO | Admitting: Internal Medicine

## 2014-09-19 ENCOUNTER — Ambulatory Visit (INDEPENDENT_AMBULATORY_CARE_PROVIDER_SITE_OTHER): Payer: Medicare HMO | Admitting: Internal Medicine

## 2014-09-19 ENCOUNTER — Encounter: Payer: Self-pay | Admitting: Internal Medicine

## 2014-09-19 VITALS — BP 136/80 | HR 71 | Temp 97.9°F | Resp 20 | Ht 60.0 in | Wt 256.0 lb

## 2014-09-19 DIAGNOSIS — Z23 Encounter for immunization: Secondary | ICD-10-CM

## 2014-09-19 DIAGNOSIS — I10 Essential (primary) hypertension: Secondary | ICD-10-CM

## 2014-09-19 NOTE — Progress Notes (Signed)
Subjective:    Patient ID: Jasmine Hood, female    DOB: 05/14/1946, 69 y.o.   MRN: [id number]  HPI  Wt Readings from Last 3 Encounters:  09/19/14 256 lb (116.121 kg)  02/25/14 262 lb (118.842 kg)  09/21/13 258 lb 4.8 oz (117.77 kg)   69 year old patient who has a history of hypertension presently controlled off medications.  She has a history of morbid obesity but has successfully controlled her blood pressure off medications with some modest weight loss.  She has a history colonic polyps.  Doing well today without concerns or complaints.  Past Medical History  Diagnosis Date  . COLONIC POLYPS, HX OF 01/05/2009    Qualifier: Diagnosis of  By: [name]  MD, [name]   . HYPERTENSION NEC 01/05/2009    Qualifier: Diagnosis of  By: [name]  MD, [name]   . HYPOTHYROIDISM 02/01/2010    Qualifier: Diagnosis of  By: [name]  MD, [name]   . Morbid obesity 01/05/2009    Qualifier: Diagnosis of  By: [name]  MD, [name]   . H/O sleep apnea     History   Social History  . Marital Status: Married    Spouse Name: N/A  . Number of Children: N/A  . Years of Education: N/A   Occupational History  . Not on file.   Social History Main Topics  . Smoking status: Never Smoker   . Smokeless tobacco: Never Used  . Alcohol Use: No  . Drug Use: No  . Sexual Activity: Not on file   Other Topics Concern  . Not on file   Social History Narrative    Past Surgical History  Procedure Laterality Date  . Tubal ligation    . Eye surgery      CATARACT    Family History  Problem Relation Age of Onset  . Cancer Mother     breast ca  . Cancer Father     throat ca  . Arthritis Neg Hx     3 sisters pos. for osteo and RA    Allergies  Allergen Reactions  . Bee Venom Anaphylaxis    Current Outpatient Prescriptions on File Prior to Visit  Medication Sig Dispense Refill  . EPINEPHrine (EPIPEN 2-PAK) 0.3 mg/0.3 mL SOAJ injection Inject 0.3 mLs (0.3 mg total) into the  muscle once. 1 Device 2   No current facility-administered medications on file prior to visit.    BP 136/80 mmHg  Pulse 71  Temp(Src) 97.9 F (36.6 C) (Oral)  Resp 20  Ht 5' (1.524 m)  Wt 256 lb (116.121 kg)  BMI 50.00 kg/m2  SpO2 97%      Review of Systems  Constitutional: Negative.   HENT: Negative for congestion, dental problem, hearing loss, rhinorrhea, sinus pressure, sore throat and tinnitus.   Eyes: Negative for pain, discharge and visual disturbance.  Respiratory: Negative for cough and shortness of breath.   Cardiovascular: Negative for chest pain, palpitations and leg swelling.  Gastrointestinal: Negative for nausea, vomiting, abdominal pain, diarrhea, constipation, blood in stool and abdominal distention.  Genitourinary: Negative for dysuria, urgency, frequency, hematuria, flank pain, vaginal bleeding, vaginal discharge, difficulty urinating, vaginal pain and pelvic pain.  Musculoskeletal: Negative for joint swelling, arthralgias and gait problem.  Skin: Negative for rash.  Neurological: Negative for dizziness, syncope, speech difficulty, weakness, numbness and headaches.  Hematological: Negative for adenopathy.  Psychiatric/Behavioral: Negative for behavioral problems, dysphoric mood and agitation. The patient is not nervous/anxious.  Objective:   Physical Exam  Constitutional: She is oriented to person, place, and time. She appears well-developed and well-nourished.  HENT:  Head: Normocephalic.  Right Ear: External ear normal.  Left Ear: External ear normal.  Mouth/Throat: Oropharynx is clear and moist.  Eyes: Conjunctivae and EOM are normal. Pupils are equal, round, and reactive to light.  Neck: Normal range of motion. Neck supple. No thyromegaly present.  Cardiovascular: Normal rate, regular rhythm, normal heart sounds and intact distal pulses.   Pulmonary/Chest: Effort normal and breath sounds normal.  Abdominal: Soft. Bowel sounds are normal. She  exhibits no mass. There is no tenderness.  Musculoskeletal: Normal range of motion.  Lymphadenopathy:    She has no cervical adenopathy.  Neurological: She is alert and oriented to person, place, and time.  Skin: Skin is warm and dry. No rash noted.  Psychiatric: She has a normal mood and affect. Her behavior is normal.          Assessment & Plan:   Hypertension.  The ferritin control off medications.  Additional weight loss encouraged.  CPX 6 months Obesity.  Weight loss exercise encouraged

## 2014-09-19 NOTE — Progress Notes (Signed)
Pre visit review using our clinic review tool, if applicable. No additional management support is needed unless otherwise documented below in the visit note. 

## 2014-09-19 NOTE — Patient Instructions (Signed)
Limit your sodium (Salt) intake    It is important that you exercise regularly, at least 20 minutes 3 to 4 times per week.  If you develop chest pain or shortness of breath seek  medical attention.  You need to lose weight.  Consider a lower calorie diet and regular exercise.  Return in 6 months for follow-up   

## 2014-10-10 ENCOUNTER — Telehealth: Payer: Self-pay | Admitting: Family Medicine

## 2014-10-10 NOTE — Telephone Encounter (Signed)
Called and spoke to the pt.  No mammogram on file.  Pt has not had a recent screen.  She is not interested at this time.

## 2015-03-20 DIAGNOSIS — Z23 Encounter for immunization: Secondary | ICD-10-CM | POA: Diagnosis not present

## 2015-03-23 ENCOUNTER — Encounter: Payer: Medicare HMO | Admitting: Internal Medicine

## 2015-04-28 ENCOUNTER — Encounter: Payer: Self-pay | Admitting: Internal Medicine

## 2015-04-28 ENCOUNTER — Ambulatory Visit (INDEPENDENT_AMBULATORY_CARE_PROVIDER_SITE_OTHER): Payer: Medicare HMO | Admitting: Internal Medicine

## 2015-04-28 VITALS — BP 128/82 | HR 75 | Temp 98.0°F | Ht 60.0 in | Wt 261.0 lb

## 2015-04-28 DIAGNOSIS — R300 Dysuria: Secondary | ICD-10-CM | POA: Diagnosis not present

## 2015-04-28 DIAGNOSIS — Z0000 Encounter for general adult medical examination without abnormal findings: Secondary | ICD-10-CM

## 2015-04-28 DIAGNOSIS — Z86010 Personal history of colonic polyps: Secondary | ICD-10-CM

## 2015-04-28 DIAGNOSIS — G4733 Obstructive sleep apnea (adult) (pediatric): Secondary | ICD-10-CM | POA: Diagnosis not present

## 2015-04-28 DIAGNOSIS — I10 Essential (primary) hypertension: Secondary | ICD-10-CM | POA: Diagnosis not present

## 2015-04-28 DIAGNOSIS — E039 Hypothyroidism, unspecified: Secondary | ICD-10-CM | POA: Diagnosis not present

## 2015-04-28 LAB — LIPID PANEL
CHOL/HDL RATIO: 3
Cholesterol: 201 mg/dL — ABNORMAL HIGH (ref 0–200)
HDL: 59.7 mg/dL (ref >39.00)
LDL Cholesterol: 119 mg/dL — ABNORMAL HIGH (ref 0–99)
NonHDL: 141.48
Triglycerides: 113 mg/dL (ref 0.0–149.0)
VLDL: 22.6 mg/dL (ref 0.0–40.0)

## 2015-04-28 LAB — POCT URINALYSIS DIPSTICK
BILIRUBIN UA: NEGATIVE
GLUCOSE UA: NEGATIVE
KETONES UA: NEGATIVE
Nitrite, UA: NEGATIVE
Protein, UA: NEGATIVE
Spec Grav, UA: 1.025
Urobilinogen, UA: 0.2
pH, UA: 5.5

## 2015-04-28 LAB — CBC WITH DIFFERENTIAL/PLATELET
BASOS ABS: 0 10*3/uL (ref 0.0–0.1)
BASOS PCT: 0.4 % (ref 0.0–3.0)
EOS PCT: 2 % (ref 0.0–5.0)
Eosinophils Absolute: 0.1 10*3/uL (ref 0.0–0.7)
HEMATOCRIT: 43.5 % (ref 36.0–46.0)
Hemoglobin: 14.5 g/dL (ref 12.0–15.0)
LYMPHS PCT: 45.8 % (ref 12.0–46.0)
Lymphs Abs: 3.1 10*3/uL (ref 0.7–4.0)
MCHC: 33.3 g/dL (ref 30.0–36.0)
MCV: 98.1 fl (ref 78.0–100.0)
MONOS PCT: 6.5 % (ref 3.0–12.0)
Monocytes Absolute: 0.4 10*3/uL (ref 0.1–1.0)
NEUTROS ABS: 3.1 10*3/uL (ref 1.4–7.7)
Neutrophils Relative %: 45.3 % (ref 43.0–77.0)
PLATELETS: 184 10*3/uL (ref 150.0–400.0)
RBC: 4.43 Mil/uL (ref 3.87–5.11)
RDW: 12.6 % (ref 11.5–15.5)
WBC: 6.9 10*3/uL (ref 4.0–10.5)

## 2015-04-28 LAB — COMPREHENSIVE METABOLIC PANEL
ALT: 58 U/L — ABNORMAL HIGH (ref 0–35)
AST: 50 U/L — ABNORMAL HIGH (ref 0–37)
Albumin: 3.8 g/dL (ref 3.5–5.2)
Alkaline Phosphatase: 96 U/L (ref 39–117)
BUN: 14 mg/dL (ref 6–23)
CO2: 32 meq/L (ref 19–32)
Calcium: 9.6 mg/dL (ref 8.4–10.5)
Chloride: 107 mEq/L (ref 96–112)
Creatinine, Ser: 0.69 mg/dL (ref 0.40–1.20)
GFR: 89.06 mL/min (ref >60.00)
GLUCOSE: 120 mg/dL — AB (ref 70–99)
Potassium: 5.2 mEq/L — ABNORMAL HIGH (ref 3.5–5.1)
Sodium: 148 mEq/L — ABNORMAL HIGH (ref 135–145)
Total Bilirubin: 0.6 mg/dL (ref 0.2–1.2)
Total Protein: 7 g/dL (ref 6.0–8.3)

## 2015-04-28 LAB — TSH: TSH: 5.64 u[IU]/mL — ABNORMAL HIGH (ref 0.35–4.50)

## 2015-04-28 MED ORDER — AMOXICILLIN 500 MG PO CAPS: 500.0000 mg | ORAL_CAPSULE | Freq: Three times a day (TID) | ORAL | Status: DC

## 2015-04-28 NOTE — Patient Instructions (Addendum)
Limit your sodium (Salt) intake    It is important that you exercise regularly, at least 20 minutes 3 to 4 times per week.  If you develop chest pain or shortness of breath seek  medical attention.  You need to lose weight.  Consider a lower calorie diet and regular exercise.  Menopause is a normal process in which your reproductive ability comes to an end. This process happens gradually over a span of months to years, usually between the ages of 12 and 55. Menopause is complete when you have missed 12 consecutive menstrual periods. It is important to talk with your health care provider about some of the most common conditions that affect postmenopausal women, such as heart disease, cancer, and bone loss (osteoporosis). Adopting a healthy lifestyle and getting preventive care can help to promote your health and wellness. Those actions can also lower your chances of developing some of these common conditions. WHAT SHOULD I KNOW ABOUT MENOPAUSE? During menopause, you may experience a number of symptoms, such as:  Moderate-to-severe hot flashes.  Night sweats.  Decrease in sex drive.  Mood swings.  Headaches.  Tiredness.  Irritability.  Memory problems.  Insomnia. Choosing to treat or not to treat menopausal changes is an individual decision that you make with your health care provider. WHAT SHOULD I KNOW ABOUT HORMONE REPLACEMENT THERAPY AND SUPPLEMENTS? Hormone therapy products are effective for treating symptoms that are associated with menopause, such as hot flashes and night sweats. Hormone replacement carries certain risks, especially as you become older. If you are thinking about using estrogen or estrogen with progestin treatments, discuss the benefits and risks with your health care provider. WHAT SHOULD I KNOW ABOUT HEART DISEASE AND STROKE? Heart disease, heart attack, and stroke become more likely as you age. This may be due, in part, to the hormonal changes that your body  experiences during menopause. These can affect how your body processes dietary fats, triglycerides, and cholesterol. Heart attack and stroke are both medical emergencies. There are many things that you can do to help prevent heart disease and stroke:  Have your blood pressure checked at least every 1-2 years. High blood pressure causes heart disease and increases the risk of stroke.  If you are 56-27 years old, ask your health care provider if you should take aspirin to prevent a heart attack or a stroke.  Do not use any tobacco products, including cigarettes, chewing tobacco, or electronic cigarettes. If you need help quitting, ask your health care provider.  It is important to eat a healthy diet and maintain a healthy weight.  Be sure to include plenty of vegetables, fruits, low-fat dairy products, and lean protein.  Avoid eating foods that are high in solid fats, added sugars, or salt (sodium).  Get regular exercise. This is one of the most important things that you can do for your health.  Try to exercise for at least 150 minutes each week. The type of exercise that you do should increase your heart rate and make you sweat. This is known as moderate-intensity exercise.  Try to do strengthening exercises at least twice each week. Do these in addition to the moderate-intensity exercise.  Know your numbers.Ask your health care provider to check your cholesterol and your blood glucose. Continue to have your blood tested as directed by your health care provider. WHAT SHOULD I KNOW ABOUT CANCER SCREENING? There are several types of cancer. Take the following steps to reduce your risk and to catch any cancer  development as early as possible. Breast Cancer  Practice breast self-awareness.  This means understanding how your breasts normally appear and feel.  It also means doing regular breast self-exams. Let your health care provider know about any changes, no matter how small.  If you  are 41 or older, have a clinician do a breast exam (clinical breast exam or CBE) every year. Depending on your age, family history, and medical history, it may be recommended that you also have a yearly breast X-ray (mammogram).  If you have a family history of breast cancer, talk with your health care provider about genetic screening.  If you are at high risk for breast cancer, talk with your health care provider about having an MRI and a mammogram every year.  Breast cancer (BRCA) gene test is recommended for women who have family members with BRCA-related cancers. Results of the assessment will determine the need for genetic counseling and BRCA1 and for BRCA2 testing. BRCA-related cancers include these types:  Breast. This occurs in males or females.  Ovarian.  Tubal. This may also be called fallopian tube cancer.  Cancer of the abdominal or pelvic lining (peritoneal cancer).  Prostate.  Pancreatic. Cervical, Uterine, and Ovarian Cancer Your health care provider may recommend that you be screened regularly for cancer of the pelvic organs. These include your ovaries, uterus, and vagina. This screening involves a pelvic exam, which includes checking for microscopic changes to the surface of your cervix (Pap test).  For women ages 21-65, health care providers may recommend a pelvic exam and a Pap test every three years. For women ages 14-65, they may recommend the Pap test and pelvic exam, combined with testing for human papilloma virus (HPV), every five years. Some types of HPV increase your risk of cervical cancer. Testing for HPV may also be done on women of any age who have unclear Pap test results.  Other health care providers may not recommend any screening for nonpregnant women who are considered low risk for pelvic cancer and have no symptoms. Ask your health care provider if a screening pelvic exam is right for you.  If you have had past treatment for cervical cancer or a condition  that could lead to cancer, you need Pap tests and screening for cancer for at least 20 years after your treatment. If Pap tests have been discontinued for you, your risk factors (such as having a new sexual partner) need to be reassessed to determine if you should start having screenings again. Some women have medical problems that increase the chance of getting cervical cancer. In these cases, your health care provider may recommend that you have screening and Pap tests more often.  If you have a family history of uterine cancer or ovarian cancer, talk with your health care provider about genetic screening.  If you have vaginal bleeding after reaching menopause, tell your health care provider.  There are currently no reliable tests available to screen for ovarian cancer. Lung Cancer Lung cancer screening is recommended for adults 92-3 years old who are at high risk for lung cancer because of a history of smoking. A yearly low-dose CT scan of the lungs is recommended if you:  Currently smoke.  Have a history of at least 30 pack-years of smoking and you currently smoke or have quit within the past 15 years. A pack-year is smoking an average of one pack of cigarettes per day for one year. Yearly screening should:  Continue until it has been 15  years since you quit.  Stop if you develop a health problem that would prevent you from having lung cancer treatment. Colorectal Cancer  This type of cancer can be detected and can often be prevented.  Routine colorectal cancer screening usually begins at age 56 and continues through age 43.  If you have risk factors for colon cancer, your health care provider may recommend that you be screened at an earlier age.  If you have a family history of colorectal cancer, talk with your health care provider about genetic screening.  Your health care provider may also recommend using home test kits to check for hidden blood in your stool.  A small camera at  the end of a tube can be used to examine your colon directly (sigmoidoscopy or colonoscopy). This is done to check for the earliest forms of colorectal cancer.  Direct examination of the colon should be repeated every 5-10 years until age 18. However, if early forms of precancerous polyps or small growths are found or if you have a family history or genetic risk for colorectal cancer, you may need to be screened more often. Skin Cancer  Check your skin from head to toe regularly.  Monitor any moles. Be sure to tell your health care provider:  About any new moles or changes in moles, especially if there is a change in a mole's shape or color.  If you have a mole that is larger than the size of a pencil eraser.  If any of your family members has a history of skin cancer, especially at a young age, talk with your health care provider about genetic screening.  Always use sunscreen. Apply sunscreen liberally and repeatedly throughout the day.  Whenever you are outside, protect yourself by wearing long sleeves, pants, a wide-brimmed hat, and sunglasses. WHAT SHOULD I KNOW ABOUT OSTEOPOROSIS? Osteoporosis is a condition in which bone destruction happens more quickly than new bone creation. After menopause, you may be at an increased risk for osteoporosis. To help prevent osteoporosis or the bone fractures that can happen because of osteoporosis, the following is recommended:  If you are 56-55 years old, get at least 1,000 mg of calcium and at least 600 mg of vitamin D per day.  If you are older than age 41 but younger than age 36, get at least 1,200 mg of calcium and at least 600 mg of vitamin D per day.  If you are older than age 29, get at least 1,200 mg of calcium and at least 800 mg of vitamin D per day. Smoking and excessive alcohol intake increase the risk of osteoporosis. Eat foods that are rich in calcium and vitamin D, and do weight-bearing exercises several times each week as directed by  your health care provider. WHAT SHOULD I KNOW ABOUT HOW MENOPAUSE AFFECTS [place]? Depression may occur at any age, but it is more common as you become older. Common symptoms of depression include:  Low or sad mood.  Changes in sleep patterns.  Changes in appetite or eating patterns.  Feeling an overall lack of motivation or enjoyment of activities that you previously enjoyed.  Frequent crying spells. Talk with your health care provider if you think that you are experiencing depression. WHAT SHOULD I KNOW ABOUT IMMUNIZATIONS? It is important that you get and maintain your immunizations. These include:  Tetanus, diphtheria, and pertussis (Tdap) booster vaccine.  Influenza every year before the flu season begins.  Pneumonia vaccine.  Shingles vaccine. Your health  care provider may also recommend other immunizations.   This information is not intended to replace advice given to you by your health care provider. Make sure you discuss any questions you have with your health care provider.   Document Released: 06/28/2005 Document Revised: 05/27/2014 Document Reviewed: 01/06/2014 Elsevier Interactive Patient Education 2016 [employer].   Schedule your colonoscopy to help detect colon cancer.  Schedule your mammogram.

## 2015-04-28 NOTE — Progress Notes (Signed)
Pre visit review using our clinic review tool, if applicable. No additional management support is needed unless otherwise documented below in the visit note. 

## 2015-04-28 NOTE — Progress Notes (Signed)
Subjective:    Patient ID: Jasmine Hood, female    DOB: March 10, 1946, 69 y.o.   MRN: [id number]  HPI  History of Present Illness:  69  -year-old patient who is seen today for a wellness exam.  Medical problems  include morbid obesity, hypertension, and colonic polyps.  She has had hypothyroidism diagnosed and treated recently.  BP Readings from Last 3 Encounters:  04/28/15 128/82  09/19/14 136/80  02/25/14 164/110   Wt Readings from Last 3 Encounters:  04/28/15 261 lb (118.389 kg)  09/19/14 256 lb (116.121 kg)  02/25/14 262 lb (118.842 kg)   Concerns or complaints.  Weight is unchanged over the past year.  Denies any cardiopulmonary complaints  Here for Medicare AWV:  1. Risk factors based on Past M, S, F history: cardiovascular risk factors include exogenous  obesity and labile hypertension as well as a family history of premature cardiac disease 2. Physical Activities: sedentary 3. Depression/mood: no history of depression or mood disorder 4. Hearing: no deficits 5. ADL's: independent in all aspects of daily living 6. Fall Risk: moderate due to obesity 7. Home Safety: no problems identified  8. Height, weight, &visual acuity:height and weight stable.  No difficulty with visual acuity 9. Counseling: heart healthy diet, weight loss, exercise, all encouraged 10. Labs ordered based on risk factors: laboratory profile, including lipid panel, and TSH reviewed 11.       Referral Coordination- needs mammogram and follow-up colonoscopy 2017 12.       Care Plan- follow-up mammogram, calcium and vitamin D supplementation, weight loss, and exercise.  Encouraged 13.       Cognitive Assessment-  alert and oriented, with normal affect.  No cognitive dysfunction.  No difficulty handling all executive functions 14.  Preventive services include colonoscopies every 5 years as well as annual health examination.  Patient will continue to track home blood pressure readings and report any consistent  elevations.  Patient provided with a written and personalized  care plan 15.  Provider list includes primary care radiology ophthalmology and GI   Allergies (verified):  No Known Drug Allergies  Past History:  Past Medical History: impaired glucose tolerance exogenous obesity hypertension history of obstructive sleep apnea Colonic polyps, hx of bee sting allergy Hypothyroidism  Past Surgical History:  gravida 5, para 5 aborta zero Tubal ligation skin grafting, right anterior lower leg, secondary to brown recluse spider bite, 1998  Colonoscopy February 2012   Family History:  father died age 37, throat cancer mother died age 28 breast cancer one half brother in good health.  Two half sisters in good health 3 sisters positive for osteoarthritis, RA, ALD  Social History:  Divorced Never Smoked    Review of Systems  Constitutional: Negative for fever, appetite change, fatigue and unexpected weight change.  HENT: Negative for congestion, dental problem, ear pain, hearing loss, mouth sores, nosebleeds, sinus pressure, sore throat, tinnitus, trouble swallowing and voice change.   Eyes: Negative for photophobia, pain, redness and visual disturbance.  Respiratory: Negative for cough, chest tightness and shortness of breath.   Cardiovascular: Negative for chest pain, palpitations and leg swelling.  Gastrointestinal: Negative for nausea, vomiting, abdominal pain, diarrhea, constipation, blood in stool, abdominal distention and rectal pain.  Genitourinary: Negative for dysuria, urgency, frequency, hematuria, flank pain, vaginal bleeding, vaginal discharge, difficulty urinating, genital sores, vaginal pain, menstrual problem and pelvic pain.  Musculoskeletal: Positive for back pain and gait problem. Negative for arthralgias and neck stiffness.  Skin: Negative for rash.  Neurological: Negative for dizziness, syncope, speech difficulty, weakness, light-headedness, numbness and  headaches.  Hematological: Negative for adenopathy. Does not bruise/bleed easily.  Psychiatric/Behavioral: Negative for suicidal ideas, behavioral problems, self-injury, dysphoric mood and agitation. The patient is not nervous/anxious.        Objective:   Physical Exam  Constitutional: She is oriented to person, place, and time. She appears well-developed and well-nourished.  Blood pressure 142/82 Weight 262  HENT:  Head: Normocephalic and atraumatic.  Right Ear: External ear normal.  Left Ear: External ear normal.  Mouth/Throat: Oropharynx is clear and moist.  Pharyngeal crowding with low hanging soft palate  Eyes: Conjunctivae and EOM are normal.  Neck: Normal range of motion. Neck supple. No JVD present. No thyromegaly present.  Cardiovascular: Normal rate, regular rhythm, normal heart sounds and intact distal pulses.   No murmur heard. Pulmonary/Chest: Effort normal and breath sounds normal. She has no wheezes. She has no rales.  Abdominal: Soft. Bowel sounds are normal. She exhibits no distension and no mass. There is no tenderness. There is no rebound and no guarding.  Musculoskeletal: Normal range of motion. She exhibits no edema or tenderness.  Neurological: She is alert and oriented to person, place, and time. She has normal reflexes. No cranial nerve deficit. She exhibits normal muscle tone. Coordination normal.  Skin: Skin is warm and dry. No rash noted.  Scarring right anterior lower leg from prior spider bite  Psychiatric: She has a normal mood and affect. Her behavior is normal.          Assessment & Plan:   Preventive health examination.  Annual mammogram encouraged Morbid obesity. Weight loss encouraged Low back pain.  Stable Hypertension- History colonic polyps.  Needs colonoscopy 2017 Hypothyroidism we'll check a TSH  Recheck 6 months

## 2015-05-01 ENCOUNTER — Telehealth: Payer: Self-pay | Admitting: Internal Medicine

## 2015-05-01 MED ORDER — LEVOTHYROXINE SODIUM 50 MCG PO TABS: 50.0000 ug | ORAL_TABLET | Freq: Every day | ORAL | Status: DC

## 2015-05-01 NOTE — Telephone Encounter (Signed)
Spoke with pt and she is aware of her results

## 2015-05-01 NOTE — Telephone Encounter (Signed)
Ms. Hager returned a phone call. She said she was told to call asap.   Pt's ph# [phone] Thank you.

## 2015-05-01 NOTE — Addendum Note (Signed)
Addended by: Ailene Rud E on: 05/01/2015 12:08 PM   Modules accepted: Orders

## 2015-05-01 NOTE — Addendum Note (Signed)
Addended by: Ailene Rud E on: 05/01/2015 12:06 PM   Modules accepted: Orders

## 2015-07-14 ENCOUNTER — Encounter: Payer: Self-pay | Admitting: Gastroenterology

## 2015-08-09 ENCOUNTER — Encounter: Payer: Self-pay | Admitting: Gastroenterology

## 2015-09-18 ENCOUNTER — Telehealth: Payer: Self-pay | Admitting: *Deleted

## 2015-09-18 NOTE — Telephone Encounter (Signed)
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 Her last BMI with PCP  was 50.97. She has a hx of OSA, HTN, polyps ( TA) and thyroid issues. Do you want her to have an Ov or can she be direct at [facility]? Please advise. Thanks, Lelan Pons PV

## 2015-09-18 NOTE — Telephone Encounter (Signed)
Called pt and LMTRC to schedule a non urgent Ov with Dr Silverio Decamp due to medical issues and weight.  Let number to call us to schedule this. I did cancel her PV for 5-8 and her colon scheduled for 5-19 as she needs to see nandigam in the office prior per this TE  Lelan Pons PV

## 2015-09-18 NOTE — Telephone Encounter (Signed)
Please schedule for OV, next available non urgent. Thanks

## 2015-09-19 ENCOUNTER — Encounter: Payer: Self-pay | Admitting: Gastroenterology

## 2015-09-19 NOTE — Telephone Encounter (Signed)
Office made office visit with nandigam 11-27-2015 at 10 am . Spoke to patient and instructed why Ov per MD.   Lelan Pons PV

## 2015-10-06 ENCOUNTER — Encounter: Payer: Medicare HMO | Admitting: Gastroenterology

## 2015-11-13 DIAGNOSIS — Z6841 Body Mass Index (BMI) 40.0 and over, adult: Secondary | ICD-10-CM | POA: Diagnosis not present

## 2015-11-13 DIAGNOSIS — Z0000 Encounter for general adult medical examination without abnormal findings: Secondary | ICD-10-CM | POA: Diagnosis not present

## 2015-11-27 ENCOUNTER — Ambulatory Visit (INDEPENDENT_AMBULATORY_CARE_PROVIDER_SITE_OTHER): Payer: Medicare HMO | Admitting: Gastroenterology

## 2015-11-27 ENCOUNTER — Encounter: Payer: Self-pay | Admitting: Gastroenterology

## 2015-11-27 VITALS — BP 170/90 | HR 80 | Ht 59.75 in | Wt 263.4 lb

## 2015-11-27 DIAGNOSIS — Z85038 Personal history of other malignant neoplasm of large intestine: Secondary | ICD-10-CM

## 2015-11-27 DIAGNOSIS — E039 Hypothyroidism, unspecified: Secondary | ICD-10-CM

## 2015-11-27 DIAGNOSIS — Z08 Encounter for follow-up examination after completed treatment for malignant neoplasm: Secondary | ICD-10-CM | POA: Diagnosis not present

## 2015-11-27 MED ORDER — NA SULFATE-K SULFATE-MG SULF 17.5-3.13-1.6 GM/177ML PO SOLN: 1.0000 | Freq: Once | ORAL | Status: DC

## 2015-11-27 NOTE — Progress Notes (Signed)
Jasmine Hood    [id number]    27-Dec-1945  Primary Care Physician:KWIATKOWSKI,PETER [name], MD  Referring Physician: Marletta Lor, MD [place], [name] [id number]  Chief complaint:  Surveillance for colorectal cancer   HPI:  70 year old female with morbid obesity, hypothyroidism and obstructive sleep apnea here to discuss screening colonoscopy. Patient is very active, does ride her bike and also does swimming. She is very reluctant to take any medication as she is concerned about potential side effects and is currently not on medication for hypothyroidism, last TSH level in December 2016 was 5.6. She is also not using CPAP for obstructive sleep apnea. Last colonoscopy in January 2012 with removal of 3 mm sessile tubular adenoma. Patient has no complaints and she is doing well overall otherwise. Denies any nausea, vomiting, abdominal pain, melena or bright red blood per rectum    Outpatient Encounter Prescriptions as of 11/27/2015  Medication Sig  . EPINEPHrine (EPIPEN 2-PAK) 0.3 mg/0.3 mL SOAJ injection Inject 0.3 mLs (0.3 mg total) into the muscle once. (Patient not taking: Reported on 11/27/2015)  . [DISCONTINUED] amoxicillin (AMOXIL) 500 MG capsule Take 1 capsule (500 mg total) by mouth 3 (three) times daily.  . [DISCONTINUED] levothyroxine (SYNTHROID, LEVOTHROID) 50 MCG tablet Take 1 tablet (50 mcg total) by mouth daily.   No facility-administered encounter medications on file as of 11/27/2015.    Allergies as of 11/27/2015 - Review Complete 11/27/2015  Allergen Reaction Noted  . Bee venom Anaphylaxis 08/24/2013    Past Medical History  Diagnosis Date  . COLONIC POLYPS, HX OF 01/05/2009    Qualifier: Diagnosis of  By: [name]  MD, [name]   . HYPERTENSION NEC 01/05/2009    Qualifier: Diagnosis of  By: [name]  MD, [name]   . HYPOTHYROIDISM 02/01/2010    Qualifier: Diagnosis of  By: [name]  MD, [name]   . Morbid obesity ([place])  01/05/2009    Qualifier: Diagnosis of  By: [name]  MD, [name]   . Sleep apnea   . Brown recluse spider bite     right leg    Past Surgical History  Procedure Laterality Date  . Tubal ligation    . Cataract extraction Bilateral     Family History  Problem Relation Age of Onset  . Breast cancer Mother   . Throat cancer Father     smoker  . Rheum arthritis Sister     3 sisters pos. for osteo and RA  . Diabetes Maternal Aunt   . Cancer Maternal Grandfather     mouth cancer-chewed tobacco    Social History   Social History  . Marital Status: Married    Spouse Name: N/A  . Number of Children: 5  . Years of Education: N/A   Occupational History  . retired    Social History Main Topics  . Smoking status: Never Smoker   . Smokeless tobacco: Never Used  . Alcohol Use: No  . Drug Use: No  . Sexual Activity: Not on file   Other Topics Concern  . Not on file   Social History Narrative      Review of systems: Review of Systems  Constitutional: Negative for fever and chills.  HENT: Negative.   Eyes: Negative for blurred vision.  Respiratory: Negative for cough, shortness of breath and wheezing.   Cardiovascular: Negative for chest pain and palpitations.  Gastrointestinal: as per HPI Genitourinary: Negative for  dysuria, urgency, frequency and hematuria.  Musculoskeletal: Negative for myalgias, back pain and joint pain.  Skin: Negative for itching and rash.  Neurological: Negative for dizziness, tremors, focal weakness, seizures and loss of consciousness.  Endo/Heme/Allergies: Negative for environmental allergies.  Psychiatric/Behavioral: Negative for depression, suicidal ideas and hallucinations.  All other systems reviewed and are negative.   Physical Exam: Filed Vitals:   11/27/15 0854  BP: 170/90  Pulse: 80   Gen:      No acute distress, obese HEENT:  EOMI, sclera anicteric Neck:     No masses; no thyromegaly Lungs:    Clear to auscultation  bilaterally; normal respiratory effort CV:         Regular rate and rhythm; no murmurs Abd:      + bowel sounds; soft, non-tender; no palpable masses, no distension Ext:    No edema; adequate peripheral perfusion Skin:      Warm and dry; no rash Neuro: alert and oriented x 3 Psych: normal mood and affect  Data Reviewed:   Reviewed chart in epic   Assessment and Plan/Recommendations:  70 year old female with morbid obesity, hypothyroidism, obstructive sleep apnea here to discuss colonoscopy for surveillance  We'll schedule colonoscopy at Hospital given BMI greater than 50 with comorbid conditions Discussed in detail with the patient the importance of taking medication for hypothyroidism as low thyroid level can cause significant morbidity and the benefit of taking thyroid supplements is much greater than potential side effects She is willing to discuss with her primary care physician about starting levothyroxine Also discussed diet and exercise to lose weight Return as needed  K. Denzil Magnuson , MD [phone] Mon-Fri 8a-5p [phone] after 5p, weekends, holidays  CC: Marletta Lor, MD

## 2015-11-27 NOTE — Patient Instructions (Signed)
You have been scheduled for your colonoscopy at [facility] Separate instructions have been given  Follow up as needed

## 2015-12-15 ENCOUNTER — Encounter (HOSPITAL_COMMUNITY): Payer: Self-pay | Admitting: *Deleted

## 2015-12-21 ENCOUNTER — Encounter (HOSPITAL_COMMUNITY): Payer: Self-pay | Admitting: Anesthesiology

## 2015-12-21 NOTE — Anesthesia Preprocedure Evaluation (Addendum)
Anesthesia Evaluation  Patient identified by MRN, date of birth, ID band Patient awake    Reviewed: Allergy & Precautions, NPO status , Patient's Chart, lab work & pertinent test results  History of Anesthesia Complications (+) PROLONGED EMERGENCE and history of anesthetic complications  Airway Mallampati: III  TM Distance: >3 FB Neck ROM: Full    Dental no notable dental hx.    Pulmonary sleep apnea ,    Pulmonary exam normal        Cardiovascular negative cardio ROS Normal cardiovascular exam     Neuro/Psych negative neurological ROS  negative psych ROS   GI/Hepatic negative GI ROS, Neg liver ROS,   Endo/Other  Hypothyroidism Morbid obesity  Renal/GU negative Renal ROS  negative genitourinary   Musculoskeletal negative musculoskeletal ROS (+)   Abdominal Normal abdominal exam  (+)   Peds negative pediatric ROS (+)  Hematology negative hematology ROS (+)   Anesthesia Other Findings   Reproductive/Obstetrics negative OB ROS                            Anesthesia Physical Anesthesia Plan  ASA: III  Anesthesia Plan: MAC   Post-op Pain Management:    Induction:   Airway Management Planned: Mask  Additional Equipment: None  Intra-op Plan:   Post-operative Plan:   Informed Consent:   Plan Discussed with:   Anesthesia Plan Comments:         Anesthesia Quick Evaluation  Blood pressure elevated this Am. Likely due to patient being nervous and poor fitting BP cuff.

## 2015-12-22 ENCOUNTER — Encounter (HOSPITAL_COMMUNITY)
Admission: RE | Disposition: A | Discharge status: Home / Self Care | Payer: Self-pay | Source: Ambulatory Visit | Attending: Gastroenterology

## 2015-12-22 ENCOUNTER — Ambulatory Visit (HOSPITAL_COMMUNITY): Payer: Medicare HMO | Admitting: Anesthesiology

## 2015-12-22 ENCOUNTER — Ambulatory Visit (HOSPITAL_COMMUNITY)
Admission: RE | Admit: 2015-12-22 | Discharge: 2015-12-22 | Disposition: A | Discharge status: Home / Self Care | Payer: Medicare HMO | Source: Ambulatory Visit | Attending: Gastroenterology | Admitting: Gastroenterology

## 2015-12-22 ENCOUNTER — Encounter (HOSPITAL_COMMUNITY): Payer: Self-pay | Admitting: Anesthesiology

## 2015-12-22 DIAGNOSIS — G4733 Obstructive sleep apnea (adult) (pediatric): Secondary | ICD-10-CM | POA: Diagnosis not present

## 2015-12-22 DIAGNOSIS — D120 Benign neoplasm of cecum: Secondary | ICD-10-CM | POA: Diagnosis not present

## 2015-12-22 DIAGNOSIS — Z85038 Personal history of other malignant neoplasm of large intestine: Secondary | ICD-10-CM

## 2015-12-22 DIAGNOSIS — Z1211 Encounter for screening for malignant neoplasm of colon: Secondary | ICD-10-CM | POA: Insufficient documentation

## 2015-12-22 DIAGNOSIS — D124 Benign neoplasm of descending colon: Secondary | ICD-10-CM | POA: Diagnosis not present

## 2015-12-22 DIAGNOSIS — Z08 Encounter for follow-up examination after completed treatment for malignant neoplasm: Secondary | ICD-10-CM

## 2015-12-22 DIAGNOSIS — Z6841 Body Mass Index (BMI) 40.0 and over, adult: Secondary | ICD-10-CM | POA: Diagnosis not present

## 2015-12-22 DIAGNOSIS — D122 Benign neoplasm of ascending colon: Secondary | ICD-10-CM | POA: Insufficient documentation

## 2015-12-22 DIAGNOSIS — Z86010 Personal history of colonic polyps: Secondary | ICD-10-CM | POA: Diagnosis not present

## 2015-12-22 DIAGNOSIS — K648 Other hemorrhoids: Secondary | ICD-10-CM | POA: Diagnosis not present

## 2015-12-22 DIAGNOSIS — D123 Benign neoplasm of transverse colon: Secondary | ICD-10-CM

## 2015-12-22 DIAGNOSIS — I10 Essential (primary) hypertension: Secondary | ICD-10-CM | POA: Diagnosis not present

## 2015-12-22 DIAGNOSIS — K635 Polyp of colon: Secondary | ICD-10-CM | POA: Diagnosis not present

## 2015-12-22 DIAGNOSIS — E039 Hypothyroidism, unspecified: Secondary | ICD-10-CM | POA: Insufficient documentation

## 2015-12-22 HISTORY — DX: Adverse effect of unspecified anesthetic, initial encounter: T41.45XA

## 2015-12-22 HISTORY — DX: Other complications of anesthesia, initial encounter: T88.59XA

## 2015-12-22 HISTORY — DX: Pneumonia, unspecified organism: J18.9

## 2015-12-22 HISTORY — PX: COLONOSCOPY WITH PROPOFOL: SHX5780

## 2015-12-22 SURGERY — COLONOSCOPY WITH PROPOFOL
Anesthesia: Monitor Anesthesia Care

## 2015-12-22 MED ORDER — EPHEDRINE SULFATE 50 MG/ML IJ SOLN
INTRAMUSCULAR | Status: AC
  Filled 2015-12-22: qty 1

## 2015-12-22 MED ORDER — SODIUM CHLORIDE 0.9 % IJ SOLN
INTRAMUSCULAR | Status: AC
  Filled 2015-12-22: qty 10

## 2015-12-22 MED ORDER — PROPOFOL 500 MG/50ML IV EMUL
INTRAVENOUS | Status: DC | PRN
  Administered 2015-12-22: 30 mg via INTRAVENOUS

## 2015-12-22 MED ORDER — LACTATED RINGERS IV SOLN
INTRAVENOUS | Status: DC | PRN
  Administered 2015-12-22: 07:00:00 via INTRAVENOUS
  Administered 2015-12-22: 1000 mL

## 2015-12-22 MED ORDER — SODIUM CHLORIDE 0.9 % IV SOLN: INTRAVENOUS | Status: DC

## 2015-12-22 MED ORDER — MIDAZOLAM HCL 2 MG/2ML IJ SOLN
INTRAMUSCULAR | Status: AC
  Filled 2015-12-22: qty 2

## 2015-12-22 MED ORDER — PROPOFOL 10 MG/ML IV BOLUS
INTRAVENOUS | Status: AC
  Filled 2015-12-22: qty 60

## 2015-12-22 MED ORDER — ONDANSETRON HCL 4 MG/2ML IJ SOLN
INTRAMUSCULAR | Status: AC
  Filled 2015-12-22: qty 4

## 2015-12-22 MED ORDER — MIDAZOLAM HCL 5 MG/5ML IJ SOLN
INTRAMUSCULAR | Status: DC | PRN
  Administered 2015-12-22: 2 mg via INTRAVENOUS

## 2015-12-22 MED ORDER — PROPOFOL 500 MG/50ML IV EMUL
INTRAVENOUS | Status: DC | PRN
  Administered 2015-12-22: 100 ug/kg/min via INTRAVENOUS

## 2015-12-22 SURGICAL SUPPLY — 21 items

## 2015-12-22 NOTE — Op Note (Signed)
[facility] Patient Name: Jasmine Hood Procedure Date: 12/22/2015 MRN: [id number] Attending MD: [name] , MD Date of Birth: [birth year] CSN: [id number] Age: 70 Admit Type: Outpatient Procedure:                Colonoscopy Indications:              Surveillance: Personal history of adenomatous                            polyps on last colonoscopy 5 years ago Providers:                [name], MD, Cleda Daub, RN, Corliss Parish, Technician Referring MD:              Medicines:                Monitored Anesthesia Care Complications:            No immediate complications. Estimated Blood Loss:     Estimated blood loss was minimal. Procedure:                Pre-Anesthesia Assessment:                           - Prior to the procedure, a History and Physical                            was performed, and patient medications and                            allergies were reviewed. The patient's tolerance of                            previous anesthesia was also reviewed. The risks                            and benefits of the procedure and the sedation                            options and risks were discussed with the patient.                            All questions were answered, and informed consent                            was obtained. Prior Anticoagulants: The patient has                            taken no previous anticoagulant or antiplatelet                            agents. ASA Grade Assessment: IV - A patient with  severe systemic disease that is a constant threat                            to life. After reviewing the risks and benefits,                            the patient was deemed in satisfactory condition to                            undergo the procedure.                           After obtaining informed consent, the colonoscope                            was passed  under direct vision. Throughout the                            procedure, the patient's blood pressure, pulse, and                            oxygen saturations were monitored continuously. The                            EC-[id number] scope was introduced through                            the anus and advanced to the the terminal ileum,                            with identification of the appendiceal orifice and                            IC valve. The colonoscopy was performed without                            difficulty. The patient tolerated the procedure                            well. The quality of the bowel preparation was                            good. The ileocecal valve, appendiceal orifice, and                            rectum were photographed. Scope In: 8:40:15 AM Scope Out: 9:00:08 AM Scope Withdrawal Time: 0 hours 12 minutes 20 seconds  Total Procedure Duration: 0 hours 19 minutes 53 seconds  Findings:      The perianal and digital rectal examinations were normal.      A 2 mm polyp was found in the cecum. The polyp was sessile. The polyp       was removed with a cold biopsy forceps. Resection and retrieval were       complete.  Four sessile polyps were found in the descending colon, transverse colon       and ascending colon. The polyps were 5 to 7 mm in size. These polyps       were removed with a cold snare. Resection and retrieval were complete.      A 12 mm polyp was found in the descending colon. The polyp was       pedunculated. The polyp was removed with a hot snare. Resection and       retrieval were complete.      Non-bleeding internal hemorrhoids were found during retroflexion. The       hemorrhoids were small. Impression:               - One 2 mm polyp in the cecum, removed with a cold                            biopsy forceps. Resected and retrieved.                           - Four 5 to 7 mm polyps in the descending colon, in                             the transverse colon and in the ascending colon,                            removed with a cold snare. Resected and retrieved.                           - One 12 mm polyp in the descending colon, removed                            with a hot snare. Resected and retrieved.                           - Non-bleeding internal hemorrhoids. Moderate Sedation:      N/A- Per Anesthesia Care Recommendation:           - Patient has a contact number available for                            emergencies. The signs and symptoms of potential                            delayed complications were discussed with the                            patient. Return to normal activities tomorrow.                            Written discharge instructions were provided to the                            patient.                           -  Resume previous diet.                           - Continue present medications.                           - Await pathology results.                           - Repeat colonoscopy in 3 years for surveillance.                           - Return to GI clinic PRN. Procedure Code(s):        --- Professional ---                           [phone], Colonoscopy, flexible; with removal of                            tumor(s), polyp(s), or other lesion(s) by snare                            technique                           45380, 28, Colonoscopy, flexible; with biopsy,                            single or multiple Diagnosis Code(s):        --- Professional ---                           Z86.010, Personal history of colonic polyps                           D12.0, Benign neoplasm of cecum                           D12.4, Benign neoplasm of descending colon                           D12.3, Benign neoplasm of transverse colon (hepatic                            flexure or splenic flexure)                           D12.2, Benign neoplasm of ascending colon                            K64.8, Other hemorrhoids CPT copyright 2016 American Medical Association. All rights reserved. The codes documented in this report are preliminary and upon coder review may  be revised to meet current compliance requirements. [name], MD 12/22/2015 9:08:49 AM This report has been signed electronically. Number of Addenda: 0

## 2015-12-22 NOTE — Transfer of Care (Signed)
Immediate Anesthesia Transfer of Care Note  Patient: Jasmine Hood  Procedure(s) Performed: Procedure(s): COLONOSCOPY WITH PROPOFOL (N/A)  Patient Location: PACU  Anesthesia Type:MAC  Level of Consciousness:  sedated, patient cooperative and responds to stimulation  Airway & Oxygen Therapy:Patient Spontanous Breathing and Patient connected to face mask oxgen  Post-op Assessment:  Report given to PACU RN and Post -op Vital signs reviewed and stable  Post vital signs:  Reviewed and stable  Last Vitals:  Vitals:   12/22/15 0717  BP: (!) 214/84  Pulse: 77  Resp: 18  Temp: 123XX123 C    Complications: No apparent anesthesia complications

## 2015-12-22 NOTE — Discharge Instructions (Signed)
Colonoscopy, Care After °Refer to this sheet in the next few weeks. These instructions provide you with information on caring for yourself after your procedure. Your health care provider may also give you more specific instructions. Your treatment has been planned according to current medical practices, but problems sometimes occur. Call your health care provider if you have any problems or questions after your procedure. °WHAT TO EXPECT AFTER THE PROCEDURE  °After your procedure, it is typical to have the following: °· A small amount of blood in your stool. °· Moderate amounts of gas and mild abdominal cramping or bloating. °HOME CARE INSTRUCTIONS °· Do not drive, operate machinery, or sign important documents for 24 hours. °· You may shower and resume your regular physical activities, but move at a slower pace for the first 24 hours. °· Take frequent rest periods for the first 24 hours. °· Walk around or put a warm pack on your abdomen to help reduce abdominal cramping and bloating. °· Drink enough fluids to keep your urine clear or pale yellow. °· You may resume your normal diet as instructed by your health care provider. Avoid heavy or fried foods that are hard to digest. °· Avoid drinking alcohol for 24 hours or as instructed by your health care provider. °· Only take over-the-counter or prescription medicines as directed by your health care provider. °· If a tissue sample (biopsy) was taken during your procedure: °¨ Do not take aspirin or blood thinners for 7 days, or as instructed by your health care provider. °¨ Do not drink alcohol for 7 days, or as instructed by your health care provider. °¨ Eat soft foods for the first 24 hours. °SEEK MEDICAL CARE IF: °You have persistent spotting of blood in your stool 2-3 days after the procedure. °SEEK IMMEDIATE MEDICAL CARE IF: °· You have more than a small spotting of blood in your stool. °· You pass large blood clots in your stool. °· Your abdomen is swollen  (distended). °· You have nausea or vomiting. °· You have a fever. °· You have increasing abdominal pain that is not relieved with medicine. °  °This information is not intended to replace advice given to you by your health care provider. Make sure you discuss any questions you have with your health care provider. °  °Document Released: 12/19/2003 Document Revised: 02/24/2013 Document Reviewed: 01/11/2013 °Elsevier Interactive Patient Education ©2016 [employer]. ° °

## 2015-12-22 NOTE — Anesthesia Postprocedure Evaluation (Signed)
Anesthesia Post Note  Patient: Jasmine Hood  Procedure(s) Performed: Procedure(s) (LRB): COLONOSCOPY WITH PROPOFOL (N/A)  Patient location during evaluation: PACU Anesthesia Type: MAC Level of consciousness: awake and awake and alert Pain management: pain level controlled Vital Signs Assessment: vitals unstable and post-procedure vital signs reviewed and stable Respiratory status: spontaneous breathing, nonlabored ventilation and respiratory function stable Cardiovascular status: blood pressure returned to baseline Anesthetic complications: no    Last Vitals:  Vitals:   12/22/15 0930 12/22/15 0935  BP: (!) 170/100 (!) 190/101  Pulse:    Resp:    Temp:      Last Pain:  Vitals:   12/22/15 0908  TempSrc: Oral                 Jasmine Hood

## 2015-12-22 NOTE — H&P (View-Only) (Signed)
Jasmine Hood    [id number]    09/21/1945  Primary Care Physician:KWIATKOWSKI,PETER [name], MD  Referring Physician: Marletta Lor, MD [place], [place] [address]  Chief complaint:  Surveillance for colorectal cancer   HPI:  70 year old female with morbid obesity, hypothyroidism and obstructive sleep apnea here to discuss screening colonoscopy. Patient is very active, does ride her bike and also does swimming. She is very reluctant to take any medication as she is concerned about potential side effects and is currently not on medication for hypothyroidism, last TSH level in December 2016 was 5.6. She is also not using CPAP for obstructive sleep apnea. Last colonoscopy in January 2012 with removal of 3 mm sessile tubular adenoma. Patient has no complaints and she is doing well overall otherwise. Denies any nausea, vomiting, abdominal pain, melena or bright red blood per rectum    Outpatient Encounter Prescriptions as of 11/27/2015  Medication Sig  . EPINEPHrine (EPIPEN 2-PAK) 0.3 mg/0.3 mL SOAJ injection Inject 0.3 mLs (0.3 mg total) into the muscle once. (Patient not taking: Reported on 11/27/2015)  . [DISCONTINUED] amoxicillin (AMOXIL) 500 MG capsule Take 1 capsule (500 mg total) by mouth 3 (three) times daily.  . [DISCONTINUED] levothyroxine (SYNTHROID, LEVOTHROID) 50 MCG tablet Take 1 tablet (50 mcg total) by mouth daily.   No facility-administered encounter medications on file as of 11/27/2015.    Allergies as of 11/27/2015 - Review Complete 11/27/2015  Allergen Reaction Noted  . Bee venom Anaphylaxis 08/24/2013    Past Medical History  Diagnosis Date  . COLONIC POLYPS, HX OF 01/05/2009    Qualifier: Diagnosis of  By: [name]  MD, [name]   . HYPERTENSION NEC 01/05/2009    Qualifier: Diagnosis of  By: [name]  MD, [name]   . HYPOTHYROIDISM 02/01/2010    Qualifier: Diagnosis of  By: [name]  MD, [name]   . Morbid obesity ([place])  01/05/2009    Qualifier: Diagnosis of  By: [name]  MD, [name]   . Sleep apnea   . Brown recluse spider bite     right leg    Past Surgical History  Procedure Laterality Date  . Tubal ligation    . Cataract extraction Bilateral     Family History  Problem Relation Age of Onset  . Breast cancer Mother   . Throat cancer Father     smoker  . Rheum arthritis Sister     3 sisters pos. for osteo and RA  . Diabetes Maternal Aunt   . Cancer Maternal Grandfather     mouth cancer-chewed tobacco    Social History   Social History  . Marital Status: Married    Spouse Name: N/A  . Number of Children: 5  . Years of Education: N/A   Occupational History  . retired    Social History Main Topics  . Smoking status: Never Smoker   . Smokeless tobacco: Never Used  . Alcohol Use: No  . Drug Use: No  . Sexual Activity: Not on file   Other Topics Concern  . Not on file   Social History Narrative      Review of systems: Review of Systems  Constitutional: Negative for fever and chills.  HENT: Negative.   Eyes: Negative for blurred vision.  Respiratory: Negative for cough, shortness of breath and wheezing.   Cardiovascular: Negative for chest pain and palpitations.  Gastrointestinal: as per HPI Genitourinary: Negative for  dysuria, urgency, frequency and hematuria.  Musculoskeletal: Negative for myalgias, back pain and joint pain.  Skin: Negative for itching and rash.  Neurological: Negative for dizziness, tremors, focal weakness, seizures and loss of consciousness.  Endo/Heme/Allergies: Negative for environmental allergies.  Psychiatric/Behavioral: Negative for depression, suicidal ideas and hallucinations.  All other systems reviewed and are negative.   Physical Exam: Filed Vitals:   11/27/15 0854  BP: 170/90  Pulse: 80   Gen:      No acute distress, obese HEENT:  EOMI, sclera anicteric Neck:     No masses; no thyromegaly Lungs:    Clear to auscultation  bilaterally; normal respiratory effort CV:         Regular rate and rhythm; no murmurs Abd:      + bowel sounds; soft, non-tender; no palpable masses, no distension Ext:    No edema; adequate peripheral perfusion Skin:      Warm and dry; no rash Neuro: alert and oriented x 3 Psych: normal mood and affect  Data Reviewed:   Reviewed chart in epic   Assessment and Plan/Recommendations:  70 year old female with morbid obesity, hypothyroidism, obstructive sleep apnea here to discuss colonoscopy for surveillance  We'll schedule colonoscopy at Hospital given BMI greater than 50 with comorbid conditions Discussed in detail with the patient the importance of taking medication for hypothyroidism as low thyroid level can cause significant morbidity and the benefit of taking thyroid supplements is much greater than potential side effects She is willing to discuss with her primary care physician about starting levothyroxine Also discussed diet and exercise to lose weight Return as needed  K. Denzil Magnuson , MD [phone] Mon-Fri 8a-5p [phone] after 5p, weekends, holidays  CC: Marletta Lor, MD

## 2015-12-22 NOTE — Interval H&P Note (Signed)
History and Physical Interval Note:  12/22/2015 8:26 AM  Jasmine Hood  has presented today for surgery, with the diagnosis of Survalience colonoscopy   The various methods of treatment have been discussed with the patient and family. After consideration of risks, benefits and other options for treatment, the patient has consented to  Procedure(s): COLONOSCOPY WITH PROPOFOL (N/A) as a surgical intervention .  The patient's history has been reviewed, patient examined, no change in status, stable for surgery.  I have reviewed the patient's chart and labs.  Questions were answered to the patient's satisfaction.     [name]

## 2015-12-22 NOTE — Progress Notes (Signed)
Dr. Lamarr Lulas made aware of pre-op BP which was 214/75. Pt does state she is nervous and it has never been that high before.

## 2015-12-25 ENCOUNTER — Encounter (HOSPITAL_COMMUNITY): Payer: Self-pay | Admitting: Gastroenterology

## 2016-02-07 DIAGNOSIS — R69 Illness, unspecified: Secondary | ICD-10-CM | POA: Diagnosis not present

## 2016-07-31 ENCOUNTER — Other Ambulatory Visit: Payer: Self-pay

## 2016-09-20 ENCOUNTER — Ambulatory Visit (INDEPENDENT_AMBULATORY_CARE_PROVIDER_SITE_OTHER): Payer: Medicare HMO | Admitting: Internal Medicine

## 2016-09-20 ENCOUNTER — Encounter: Payer: Self-pay | Admitting: Internal Medicine

## 2016-09-20 VITALS — BP 168/86 | HR 87 | Temp 98.0°F | Ht 59.75 in | Wt 253.8 lb

## 2016-09-20 DIAGNOSIS — N6341 Unspecified lump in right breast, subareolar: Secondary | ICD-10-CM

## 2016-09-20 NOTE — Patient Instructions (Signed)
  Please schedule a diagnostic mammogram as soon as possible  General surgery consultation for suspected breast cancer

## 2016-09-20 NOTE — Progress Notes (Signed)
Subjective:    Patient ID: Jasmine Hood, female    DOB: 1946-03-06, 71 y.o.   MRN: [id number]  HPI  71 year old patient who is seen today with a chief complaint of a rash involving the right breast area. She states that she has been dealing with a rash since August when she had a colonoscopy performed at that time.  She states that she developed a rash/burn involving the right breast area related to placement of the EKG electrodes.  She has been using Neosporin and antibacterial soap.  She was last seen here in 2006 for an annual exam. A mammogram was suggested at that time. She has been reluctant to have follow-up mammograms due to concerns of the study began a painful procedure.  She does have a family history ( mother) of breast cancer.  Past Medical History:  Diagnosis Date  . Brown recluse spider bite    right leg  . COLONIC POLYPS, HX OF 01/05/2009   Qualifier: Diagnosis of  By: [name]  MD, [name]   . Complication of anesthesia    slow to awaken after wisdom  teeth extraction age 52  . HYPOTHYROIDISM 02/01/2010   Qualifier: Diagnosis of  By: [name]  MD, [name]   . Morbid obesity ([place]) 01/05/2009   Qualifier: Diagnosis of  By: [name]  MD, [name]   . Pneumonia 1987  . Sleep apnea    no cpap used      Social History   Social History  . Marital status: Married    Spouse name: N/A  . Number of children: 5  . Years of education: N/A   Occupational History  . retired    Social History Main Topics  . Smoking status: Never Smoker  . Smokeless tobacco: Never Used  . Alcohol use No  . Drug use: No  . Sexual activity: Not on file   Other Topics Concern  . Not on file   Social History Narrative  . No narrative on file    Past Surgical History:  Procedure Laterality Date  . CATARACT EXTRACTION Bilateral   . COLONOSCOPY WITH PROPOFOL N/A 12/22/2015   Procedure: COLONOSCOPY WITH PROPOFOL;  Surgeon: [name], MD;  Location: [facility];   Service: Endoscopy;  Laterality: N/A;  . colonscopy  2012   with polyp removed  . surgery for spider bite  1998  . TUBAL LIGATION    . WISDOM TOOTH EXTRACTION  age 44    Family History  Problem Relation Age of Onset  . Breast cancer Mother   . Throat cancer Father     smoker  . Rheum arthritis Sister     3 sisters pos. for osteo and RA  . Diabetes Maternal Aunt   . Cancer Maternal Grandfather     mouth cancer-chewed tobacco    Allergies  Allergen Reactions  . Bee Venom Anaphylaxis    Current Outpatient Prescriptions on File Prior to Visit  Medication Sig Dispense Refill  . EPINEPHrine (EPIPEN 2-PAK) 0.3 mg/0.3 mL SOAJ injection Inject 0.3 mLs (0.3 mg total) into the muscle once. 1 Device 2   No current facility-administered medications on file prior to visit.     BP (!) 168/86 (BP Location: Left Arm, Patient Position: Sitting, Cuff Size: Large)   Pulse 87   Temp 98 F (36.7 C) (Oral)   Ht 4' 11.75" (1.518 m)   Wt 253 lb 12.8 oz (115.1 kg)   SpO2 98%   BMI 49.98  kg/m    Review of Systems  Constitutional: Negative.   HENT: Negative for congestion, dental problem, hearing loss, rhinorrhea, sinus pressure, sore throat and tinnitus.   Eyes: Negative for pain, discharge and visual disturbance.  Respiratory: Negative for cough and shortness of breath.   Cardiovascular: Negative for chest pain, palpitations and leg swelling.  Gastrointestinal: Negative for abdominal distention, abdominal pain, blood in stool, constipation, diarrhea, nausea and vomiting.  Genitourinary: Negative for difficulty urinating, dysuria, flank pain, frequency, hematuria, pelvic pain, urgency, vaginal bleeding, vaginal discharge and vaginal pain.  Musculoskeletal: Negative for arthralgias, gait problem and joint swelling.  Skin: Positive for rash.  Neurological: Negative for dizziness, syncope, speech difficulty, weakness, numbness and headaches.  Hematological: Negative for adenopathy.    Psychiatric/Behavioral: Negative for agitation, behavioral problems and dysphoric mood. The patient is not nervous/anxious.        Objective:   Physical Exam  Constitutional: She appears well-developed and well-nourished. No distress.  Weight 253 Repeat blood pressure 144/80  Pulmonary/Chest:  Right breast examined Inverted nipple 1.5 centimeter firm, fairly fixed mass in the subareolar area lateral and inferior to the inverted nipple  No obvious axillary adenopathy          Assessment & Plan:   Right breast mass.  Patient made aware of high likelihood of breast cancer.  Will set up for diagnostic mammogram and biopsy and general surgical evaluation  Nyoka Cowden

## 2016-09-20 NOTE — Progress Notes (Signed)
Pre visit review using our clinic review tool, if applicable. No additional management support is needed unless otherwise documented below in the visit note. 

## 2016-09-26 ENCOUNTER — Other Ambulatory Visit: Payer: Self-pay | Admitting: Internal Medicine

## 2016-09-26 ENCOUNTER — Other Ambulatory Visit: Payer: Self-pay

## 2016-09-26 DIAGNOSIS — N6341 Unspecified lump in right breast, subareolar: Secondary | ICD-10-CM

## 2016-10-03 ENCOUNTER — Other Ambulatory Visit: Payer: Self-pay | Admitting: Internal Medicine

## 2016-10-03 ENCOUNTER — Ambulatory Visit
Admission: RE | Admit: 2016-10-03 | Discharge: 2016-10-03 | Disposition: A | Discharge status: Home / Self Care | Payer: Medicare HMO | Source: Ambulatory Visit | Attending: Internal Medicine | Admitting: Internal Medicine

## 2016-10-03 DIAGNOSIS — R921 Mammographic calcification found on diagnostic imaging of breast: Secondary | ICD-10-CM

## 2016-10-03 DIAGNOSIS — N6310 Unspecified lump in the right breast, unspecified quadrant: Secondary | ICD-10-CM

## 2016-10-03 DIAGNOSIS — R928 Other abnormal and inconclusive findings on diagnostic imaging of breast: Secondary | ICD-10-CM | POA: Diagnosis not present

## 2016-10-03 DIAGNOSIS — R599 Enlarged lymph nodes, unspecified: Secondary | ICD-10-CM

## 2016-10-03 DIAGNOSIS — N6341 Unspecified lump in right breast, subareolar: Secondary | ICD-10-CM

## 2016-10-03 DIAGNOSIS — N6313 Unspecified lump in the right breast, lower outer quadrant: Secondary | ICD-10-CM | POA: Diagnosis not present

## 2016-10-07 ENCOUNTER — Ambulatory Visit
Admission: RE | Admit: 2016-10-07 | Discharge: 2016-10-07 | Disposition: A | Discharge status: Home / Self Care | Payer: Medicare HMO | Source: Ambulatory Visit | Attending: Internal Medicine | Admitting: Internal Medicine

## 2016-10-07 ENCOUNTER — Other Ambulatory Visit: Payer: Self-pay | Admitting: Internal Medicine

## 2016-10-07 DIAGNOSIS — R590 Localized enlarged lymph nodes: Secondary | ICD-10-CM | POA: Diagnosis not present

## 2016-10-07 DIAGNOSIS — R599 Enlarged lymph nodes, unspecified: Secondary | ICD-10-CM

## 2016-10-07 DIAGNOSIS — C50511 Malignant neoplasm of lower-outer quadrant of right female breast: Secondary | ICD-10-CM | POA: Diagnosis not present

## 2016-10-07 DIAGNOSIS — C50411 Malignant neoplasm of upper-outer quadrant of right female breast: Secondary | ICD-10-CM | POA: Diagnosis not present

## 2016-10-07 DIAGNOSIS — N6310 Unspecified lump in the right breast, unspecified quadrant: Secondary | ICD-10-CM

## 2016-10-07 DIAGNOSIS — N6311 Unspecified lump in the right breast, upper outer quadrant: Secondary | ICD-10-CM | POA: Diagnosis not present

## 2016-10-07 DIAGNOSIS — N6313 Unspecified lump in the right breast, lower outer quadrant: Secondary | ICD-10-CM | POA: Diagnosis not present

## 2016-10-07 DIAGNOSIS — C50919 Malignant neoplasm of unspecified site of unspecified female breast: Secondary | ICD-10-CM

## 2016-10-07 HISTORY — DX: Malignant neoplasm of unspecified site of unspecified female breast: C50.919

## 2016-10-07 HISTORY — PX: BREAST BIOPSY: SHX20

## 2016-10-08 ENCOUNTER — Ambulatory Visit
Admission: RE | Admit: 2016-10-08 | Discharge: 2016-10-08 | Disposition: A | Discharge status: Home / Self Care | Payer: Medicare HMO | Source: Ambulatory Visit | Attending: Internal Medicine | Admitting: Internal Medicine

## 2016-10-08 ENCOUNTER — Other Ambulatory Visit: Payer: Self-pay | Admitting: Internal Medicine

## 2016-10-08 DIAGNOSIS — R921 Mammographic calcification found on diagnostic imaging of breast: Secondary | ICD-10-CM

## 2016-10-08 DIAGNOSIS — D242 Benign neoplasm of left breast: Secondary | ICD-10-CM | POA: Diagnosis not present

## 2016-10-08 HISTORY — PX: BREAST BIOPSY: SHX20

## 2016-10-09 ENCOUNTER — Other Ambulatory Visit: Payer: Self-pay | Admitting: Surgery

## 2016-10-09 DIAGNOSIS — C50911 Malignant neoplasm of unspecified site of right female breast: Secondary | ICD-10-CM | POA: Diagnosis not present

## 2016-10-10 ENCOUNTER — Telehealth: Payer: Self-pay | Admitting: Oncology

## 2016-10-10 ENCOUNTER — Other Ambulatory Visit: Payer: Self-pay | Admitting: Surgery

## 2016-10-10 ENCOUNTER — Encounter: Payer: Self-pay | Admitting: Radiation Oncology

## 2016-10-10 ENCOUNTER — Encounter: Payer: Self-pay | Admitting: Oncology

## 2016-10-10 DIAGNOSIS — C50911 Malignant neoplasm of unspecified site of right female breast: Secondary | ICD-10-CM

## 2016-10-10 NOTE — Telephone Encounter (Signed)
Appt has been scheduled for the pt to see Dr. [name] on 6/4 at 4pm. Pt aware to arrive 30 minutes early. Demographics verified. Letter mailed.

## 2016-10-16 ENCOUNTER — Encounter: Payer: Self-pay | Admitting: Radiation Oncology

## 2016-10-16 NOTE — Progress Notes (Signed)
Location of Breast Cancer:Right Breast  7 o'clock position  Histology per Pathology Report: Diagnosis 10/07/2016: 1. Breast, right, needle core biopsy, 7:00 o'clock - INVASIVE MAMMARY CARCINOMA.- SEE COMMENT. 2. Breast, right, needle core biopsy, 10:00 o'clock - INVASIVE MAMMARY CARCINOMA.- SEE COMMENT. 3. Lymph node, needle/core biopsy, right axilla- CARCINOMA.- SEE COMMENT.  Receptor Status: ER(70%+), PR (15%+), Her2-neuneg (ration 1.72), Ki-67(20%)  Did patient present with symptoms (if so, please note symptoms) or was this found on screening mammography?: patient noticed changes in September 2017 right nipple more inverted than previous years, mammogram done 10/03/16  Past/Anticipated interventions by surgeon, if any:Dr. Alphonsa Overall, MD   Past/Anticipated interventions by medical oncology, if any: Chemotherapy : Dr. [name] 10/21/16 new consult  Lymphedema issues, if any:  No Pain issues, if any:  Yes right breast intermittent  SAFETY ISSUES: Yes  Prior radiation? NO   Pacemaker/ICD? NO  Is the patient on methotrexate? NO  Current Complaints / other details:  Menarche age 70 G6P5, no tobaco use or drug use, occasional alcohol use  Mother breast cancer died age 27, maternal cousin breast cancer, daughter breast cancer, Father  Throat cancer,Maternal  Grandfather mouth cancer,chewed tobacco . BP (!) 167/79 (BP Location: Right Arm, Patient Position: Sitting)   Pulse 76   Temp 98.2 F (36.8 C) (Oral)   Resp 20   Wt 250 lb 6.4 oz (113.6 kg)   SpO2 95%   BMI 49.31 kg/m   Wt Readings from Last 3 Encounters:  10/17/16 250 lb 6.4 oz (113.6 kg)  09/20/16 253 lb 12.8 oz (115.1 kg)  12/22/15 263 lb (119.3 kg)   Rebecca Eaton, RN 10/16/2016,8:12 AM

## 2016-10-17 ENCOUNTER — Ambulatory Visit
Admission: RE | Admit: 2016-10-17 | Discharge: 2016-10-17 | Disposition: A | Discharge status: Home / Self Care | Payer: Medicare HMO | Source: Ambulatory Visit | Attending: Radiation Oncology | Admitting: Radiation Oncology

## 2016-10-17 ENCOUNTER — Ambulatory Visit: Payer: Medicare HMO | Attending: Surgery | Admitting: Physical Therapy

## 2016-10-17 ENCOUNTER — Encounter: Payer: Self-pay | Admitting: Radiation Oncology

## 2016-10-17 VITALS — BP 167/79 | HR 76 | Temp 98.2°F | Resp 20 | Wt 250.4 lb

## 2016-10-17 DIAGNOSIS — Z1509 Genetic susceptibility to other malignant neoplasm: Secondary | ICD-10-CM

## 2016-10-17 DIAGNOSIS — C50511 Malignant neoplasm of lower-outer quadrant of right female breast: Secondary | ICD-10-CM | POA: Insufficient documentation

## 2016-10-17 DIAGNOSIS — M25611 Stiffness of right shoulder, not elsewhere classified: Secondary | ICD-10-CM

## 2016-10-17 DIAGNOSIS — Z803 Family history of malignant neoplasm of breast: Secondary | ICD-10-CM | POA: Diagnosis not present

## 2016-10-17 DIAGNOSIS — C773 Secondary and unspecified malignant neoplasm of axilla and upper limb lymph nodes: Secondary | ICD-10-CM | POA: Diagnosis not present

## 2016-10-17 DIAGNOSIS — M25612 Stiffness of left shoulder, not elsewhere classified: Secondary | ICD-10-CM | POA: Diagnosis present

## 2016-10-17 DIAGNOSIS — Z170 Estrogen receptor positive status [ER+]: Secondary | ICD-10-CM | POA: Diagnosis not present

## 2016-10-17 HISTORY — DX: Malignant neoplasm of unspecified site of unspecified female breast: C50.919

## 2016-10-17 NOTE — Therapy (Signed)
[place], [place], [address] Phone: [phone]   Fax:  [phone]  Physical Therapy Evaluation  Patient Details  Name: BRYNDA HEICK MRN: [id number] Date of Birth: March 22, 1946 Referring Provider: Dr. Alphonsa Overall  Encounter Date: 10/17/2016      PT End of Session - 10/17/16 1723    Visit Number 1   Number of Visits 1   PT Start Time 0762   PT Stop Time 1520   PT Time Calculation (min) 46 min   Activity Tolerance Patient tolerated treatment well   Behavior During Therapy [facility] for tasks assessed/performed      Past Medical History:  Diagnosis Date  . Breast cancer ([place]) 10/07/2016   right breast  . [place] recluse spider bite    right leg  . COLONIC POLYPS, HX OF 01/05/2009   Qualifier: Diagnosis of  By: [name]  MD, [name]   . Complication of anesthesia    slow to awaken after wisdom  teeth extraction age 58  . HYPOTHYROIDISM 02/01/2010   Qualifier: Diagnosis of  By: [name]  MD, [name]   . Morbid obesity ([place]) 01/05/2009   Qualifier: Diagnosis of  By: [name]  MD, [name]   . Pneumonia 1987  . Sleep apnea    no cpap used     Past Surgical History:  Procedure Laterality Date  . BREAST BIOPSY Right 10/07/2016   invasive mammary carcinoma  . BREAST BIOPSY Left 10/08/2016   left  breast fibroadenoma no malignancy  . CATARACT EXTRACTION Bilateral   . COLONOSCOPY WITH PROPOFOL N/A 12/22/2015   Procedure: COLONOSCOPY WITH PROPOFOL;  Surgeon: [name], MD;  Location: [facility];  Service: Endoscopy;  Laterality: N/A;  . colonscopy  2012   with polyp removed  . surgery for spider bite  1998  . TUBAL LIGATION    . WISDOM TOOTH EXTRACTION  age 71    There were no vitals filed for this visit.       Subjective Assessment - 10/17/16 1441    Subjective Says she has not had surgery yet; will have her MRI Monday, 10/21/16.     Pertinent History Had mammogram 10/03/16  followed by biopsies of right and left breast.  Left was found to be fibroadenoma but right IDC x 2.  Alsohad positive right axillary lymph nodes.  MRI scheduled 10/21/16.  Surgery is not yet scheduled but plan is for mastectomy with two nodes to be dissected.  She doesn't yet know the full plan with chemotherapy and radiation, but expects XRT.   Patient Stated Goals get info about therapy-related issues for upcoming treatment   Currently in Pain? Yes   Pain Score 3    Pain Location Breast  and upper right flank   Pain Orientation Right   Pain Descriptors / Indicators Burning;Sharp   Aggravating Factors  turning in the bed a certain way   Pain Relieving Factors tylenol            [facility] PT Assessment - 10/17/16 0001      Assessment   Medical Diagnosis right breast cancer   Referring Provider Dr. Alphonsa Overall   Onset Date/Surgical Date --  not yet scheduled   Hand Dominance Right;Left   Prior Therapy none     Precautions   Precautions Other (comment)   Precaution Comments active cancer     Restrictions   Weight Bearing Restrictions No     Balance Screen  Has the patient fallen in the past 6 months No   Has the patient had a decrease in activity level because of a fear of falling?  No   Is the patient reluctant to leave their home because of a fear of falling?  No     Home Environment   Living Environment Private residence   Living Arrangements Children  1 year-old daughter lives with her   Type of [place] One level     Prior Function   Level of Independence Independent   Leisure was riding a bike outdoors and indoors, but not currently  does Theatre stage manager, bingo, gardening     Cognition   Overall Cognitive Status Within Functional Limits for tasks assessed     Observation/Other Assessments   Observations right anterior lower leg, brown recluse spider bite scar   Quick DASH  11.36  10 of 11 questions answered.     ROM / Strength   AROM /  PROM / Strength AROM     AROM   AROM Assessment Site Shoulder   Right/Left Shoulder Right;Left   Right Shoulder Extension 59 Degrees   Right Shoulder Flexion 116 Degrees   Right Shoulder ABduction 119 Degrees   Right Shoulder Internal Rotation 49 Degrees   Right Shoulder External Rotation 75 Degrees   Left Shoulder Extension 58 Degrees   Left Shoulder Flexion 114 Degrees   Left Shoulder ABduction 90 Degrees   Left Shoulder Internal Rotation 58 Degrees   Left Shoulder External Rotation 70 Degrees           LYMPHEDEMA/ONCOLOGY QUESTIONNAIRE - 10/17/16 1502      Type   Cancer Type right breast     Lymphedema Assessments   Lymphedema Assessments Upper extremities     Right Upper Extremity Lymphedema   10 cm Proximal to Olecranon Process 39.5 cm   Olecranon Process 31.6 cm   10 cm Proximal to Ulnar Styloid Process 30.2 cm   Just Proximal to Ulnar Styloid Process 21 cm   Across Hand at [employer] 19.2 cm   At [place] of 2nd Digit 6.5 cm     Left Upper Extremity Lymphedema   10 cm Proximal to Olecranon Process 42 cm   Olecranon Process 31.8 cm   10 cm Proximal to Ulnar Styloid Process 31.2 cm   Just Proximal to Ulnar Styloid Process 21.9 cm   Across Hand at [employer] 19.5 cm   At [place] of 2nd Digit 6.7 cm           Quick Dash - 10/17/16 0001    Open a tight or new jar Mild difficulty   Do heavy household chores (wash walls, wash floors) Mild difficulty   Carry a shopping bag or briefcase No difficulty   Wash your back No difficulty   Use a knife to cut food No difficulty   Recreational activities in which you take some force or impact through your arm, shoulder, or hand (golf, hammering, tennis) Mild difficulty   During the past week, to what extent has your arm, shoulder or hand problem interfered with your normal social activities with family, friends, neighbors, or groups? Not at all   During the past week, to what extent has your arm, shoulder or hand  problem limited your work or other regular daily activities Slightly   Tingling (pins and needles) in your arm, shoulder, or hand Mild   Difficulty Sleeping Mild difficulty  DASH Score 11.36 %      Objective measurements completed on examination: See above findings.                  PT Education - 10/17/16 1719    Education provided Yes   Education Details post-surgery ROM exercises, about ABC class, briefly about lymphedema and avoiding blood pressure and blood draws on affected side   Person(s) Educated Patient   Methods Explanation;Handout   Comprehension Verbalized understanding              Breast Clinic Goals - 10/17/16 1729      Patient will be able to verbalize understanding of pertinent lymphedema risk reduction practices relevant to her diagnosis specifically related to skin care.   Status Achieved     Patient will be able to return demonstrate and/or verbalize understanding of the post-op home exercise program related to regaining shoulder range of motion.   Status Achieved     Patient will be able to verbalize understanding of the importance of attending the postoperative After Breast Cancer Class for further lymphedema risk reduction education and therapeutic exercise.   Status Achieved               Plan - 10/17/16 1725    Clinical Impression Statement Pt. was seen today for pre-op physical therapy assessment for her right breast cancer with anticipated mastectomy and radiation treatment. Baseline measurements were taken: she does have some limited shoulder A/ROM bilat.   Clinical Presentation Evolving   Clinical Presentation due to: upcoming mastectomy and other treatment for right breast cancer   Clinical Decision Making Low   Rehab Potential Excellent   PT Frequency One time visit   PT Treatment/Interventions Patient/family education   PT Next Visit Plan None at this time. Pt. was encouraged to attend ABC class after surgery and  before radiation.  She may need follow-up physical therapy for ROM after surgery.   PT Home Exercise Plan post-op ROM exercises when drains are out and okayed by surgeon   Consulted and Agree with Plan of Care Patient      Patient will benefit from skilled therapeutic intervention in order to improve the following deficits and impairments:  Decreased range of motion  Visit Diagnosis: Stiffness of right shoulder, not elsewhere classified - Plan: PT plan of care cert/re-cert  Stiffness of left shoulder, not elsewhere classified - Plan: PT plan of care cert/re-cert      G-Codes - 66/44/03 1732    Functional Assessment Tool Used (Outpatient Only) quick DASH   Functional Limitation Carrying, moving and handling objects   Carrying, Moving and Handling Objects Current Status (K7425) At least 1 percent but less than 20 percent impaired, limited or restricted   Carrying, Moving and Handling Objects Goal Status (Z5638) At least 1 percent but less than 20 percent impaired, limited or restricted   Carrying, Moving and Handling Objects Discharge Status [phone]) At least 1 percent but less than 20 percent impaired, limited or restricted       Problem List Patient Active Problem List   Diagnosis Date Noted  . 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 Encounter for follow-up surveillance of colon cancer   . Benign neoplasm of ascending colon   . Benign neoplasm of descending colon   . Benign neoplasm of transverse colon   . Benign neoplasm of cecum   . OSA (obstructive sleep apnea) 09/14/2012  . Contact dermatitis  and eczema due to plant 12/31/2011  . Hypothyroidism 02/01/2010  . MORBID OBESITY 01/05/2009  . Essential hypertension 01/05/2009  . History of colonic polyps 01/05/2009    SALISBURY,DONNA 10/17/2016, 5:35 PM  [facility] [place], [place],  [address] Phone: [phone]   Fax:  [phone]  Name: RASA DEGRAZIA MRN: [id number] Date of Birth: 02-23-1946  Serafina Royals, PT 10/17/16 5:36 PM

## 2016-10-17 NOTE — Progress Notes (Signed)
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   HISTORY OF PRESENT ILLNESS: Jasmine Hood is a 71 y.o. female seen at the request of Dr. Lucia Gaskins for a new diagnosis of right breast cancer. The patient noted changes in her nipple inversion over time and presented for her mammogram on 10/03/16 which revealed a large 10 cm mass of the breast with sonographic concerns of two pathologic nodes in the low axilla, the largest measuring 2.4 cm. The left breast also had some indeterminate calcifications. The primary tumor measured 10 cm and appears to involve the upper outer quadrant and lower outer quadrant with skin involvement. 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 The axillary node was also positive. A left breast biopsy revealed fibroadenomatous changes. She is going to be scheduled for an MRI. She comes today to discuss options of radiotherapy and will meet with Dr. [name] on Monday of next week.    PREVIOUS RADIATION THERAPY: No   PAST MEDICAL HISTORY:  Past Medical History:  Diagnosis Date  . Breast cancer ([place]) 10/07/2016   right breast  . [place] Shark recluse spider bite    right leg  . COLONIC POLYPS, HX OF 01/05/2009   Qualifier: Diagnosis of  By: [name]  MD, [name]   . Complication of anesthesia    slow to awaken after wisdom  teeth extraction age 42  . HYPOTHYROIDISM 02/01/2010   Qualifier: Diagnosis of  By: [name]  MD, [name]   . Morbid obesity ([place]) 01/05/2009   Qualifier: Diagnosis of  By: [name]  MD, [name]   . Pneumonia 1987  . Sleep apnea      no cpap used        PAST SURGICAL HISTORY: Past Surgical History:  Procedure Laterality Date  . BREAST BIOPSY Right 10/07/2016   invasive mammary carcinoma  . BREAST BIOPSY Left 10/08/2016   left  breast fibroadenoma no malignancy  . CATARACT EXTRACTION Bilateral   . COLONOSCOPY WITH PROPOFOL N/A 12/22/2015   Procedure: COLONOSCOPY WITH PROPOFOL;  Surgeon: [name], MD;  Location: [facility];  Service: Endoscopy;  Laterality: N/A;  . colonscopy  2012   with polyp removed  . surgery for spider bite  1998  . TUBAL LIGATION    . WISDOM TOOTH EXTRACTION  age 10     FAMILY HISTORY:  Family History  Problem Relation Age of Onset  . Breast cancer Mother        deceased at 12  . Throat cancer Father        smoker  . Rheum arthritis Sister        3 sisters pos. for osteo and RA  . Diabetes Maternal Aunt   . Cancer Maternal Grandfather        mouth cancer-chewed tobacco  . Breast cancer Cousin 18  . Breast cancer Daughter      SOCIAL HISTORY:  reports that she has never smoked. She has never used smokeless tobacco. She reports that she does not drink alcohol or use drugs.   ALLERGIES: Bee venom   MEDICATIONS:  Current Outpatient Prescriptions  Medication Sig Dispense Refill  . acetaminophen (TYLENOL) 325 MG tablet Take 325 mg by mouth every 6 (six) hours as needed for mild pain.    Marland Kitchen EPINEPHrine (EPIPEN 2-PAK) 0.3 mg/0.3 mL SOAJ injection Inject 0.3 mLs (0.3 mg total) into the muscle once. 1 Device 2   No current facility-administered medications for this encounter.      REVIEW OF SYSTEMS: On review of systems, the patient reports that she is doing well overall. She denies any pain in the breast overall. She has some burning in the nipple site. She denies any bleeding or discharge. She denies any chest pain, shortness of breath, cough, fevers, chills, night sweats, unintended weight changes. She denies any bowel or bladder disturbances, and denies abdominal  pain, nausea or vomiting. She denies any new musculoskeletal or joint aches or pains. A complete review of systems is obtained and is otherwise negative.     PHYSICAL EXAM:  Wt Readings from Last 3 Encounters:  10/17/16 250 lb 6.4 oz (113.6 kg)  09/20/16 253 lb 12.8 oz (115.1 kg)  12/22/15 263 lb (119.3 kg)   Temp Readings from Last 3 Encounters:  10/17/16 98.2 F (36.8 C) (Oral)  09/20/16 98 F (36.7 C) (Oral)  12/22/15 97.6 F (36.4 C) (Oral)   BP Readings from Last 3 Encounters:  10/17/16 (!) 167/79  09/20/16 (!) 168/86  12/22/15 (!) 190/101   Pulse Readings from Last 3 Encounters:  10/17/16 76  09/20/16 87  12/22/15 75   Pain Assessment Pain Score: 2  Pain Loc: Breast/10  In general this is a well appearing caucasian female in no acute distress. She is alert and oriented x4 and appropriate throughout the examination. HEENT reveals that the patient is normocephalic, atraumatic. EOMs are intact. PERRLA. Skin is intact without any evidence of gross lesions. Cardiovascular exam reveals a regular rate and rhythm, no clicks rubs or murmurs are auscultated. Chest is clear to auscultation bilaterally. Lymphatic assessment is performed and does not reveal any adenopathy in the cervical, supraclavicular, or inguinal chains. The right breast has a biopsy site that is slightly bruised, and the breast reveals anatomic retraction of the nipple and skin retraction without any sign of peau d'orange. No erythema is noted. There is fullness in the right axilla consistent with adenopathy. No palpable masses are noted in the left breast or axilla. Abdomen has active bowel sounds in all quadrants and is intact. The abdomen is soft, non tender, non distended. Lower extremities are negative for pretibial pitting edema, deep calf tenderness, cyanosis or clubbing.   ECOG = 1  0 - Asymptomatic (Fully active, able to carry on all predisease activities without restriction)  1 - Symptomatic but  completely ambulatory (Restricted in physically strenuous activity but ambulatory and able to carry out work of a light or sedentary nature. For example, light housework, office work)  2 - Symptomatic, <50% in bed during the day (Ambulatory and capable of all self care but unable to carry out any work activities. Up and about more than 50% of waking hours)  3 - Symptomatic, >50% in bed, but not bedbound (Capable of only limited self-care, confined to bed or chair 50% or more of waking hours)  4 - Bedbound (Completely disabled. Cannot carry on any self-care. Totally confined to bed or chair)  5 - Death   Eustace Pen MM, Creech RH, Tormey DC, et al. [phone]). "Toxicity and response criteria of the [facility]". [place] Oncol.  5 (6): 649-55    LABORATORY DATA:  Lab Results  Component Value Date   WBC 6.9 04/28/2015   HGB 14.5 04/28/2015   HCT 43.5 04/28/2015   MCV 98.1 04/28/2015   PLT 184.0 04/28/2015   Lab Results  Component Value Date   NA 148 (H) 04/28/2015   K 5.2 (H) 04/28/2015   CL 107 04/28/2015   CO2 32 04/28/2015   Lab Results  Component Value Date   ALT 58 (H) 04/28/2015   AST 50 (H) 04/28/2015   ALKPHOS 96 04/28/2015   BILITOT 0.6 04/28/2015      RADIOGRAPHY: US Breast Ltd Uni Right Inc Axilla  Result Date: 10/03/2016 CLINICAL DATA:  71 year old who felt a palpable lump in the outer right breast initially approximately 4 months ago. She has an inverted right nipple though she states the right nipple is chronically inverted. She has not had a mammogram since 2001. Annual evaluation, left breast. Family history of breast cancer in her mother at unknown age and in a cousin at age 41. EXAM: 2D DIGITAL DIAGNOSTIC BILATERAL MAMMOGRAM WITH CAD AND ADJUNCT TOMO ULTRASOUND RIGHT BREAST COMPARISON:  None.  The 2001 mammograms have been purged. ACR Breast Density Category b: There are scattered areas of fibroglandular density. FINDINGS: Standard 2D and  tomosynthesis full field CC and MLO views of both breasts were obtained. A standard and tomosynthesis spot tangential view of the area of palpable concern in the right breast and standard spot magnification views of calcifications in both breasts were obtained. A large mass/focal asymmetry is present in the outer right breast extending from anterior to posterior depth, measuring at least 10 x 5 x 4 cm, involving the upper outer and lower-outer quadrants, associated with skin retraction. There is a group of fine heterogeneous calcifications immediately inferior and medial (contiguous) to the mass/asymmetry which spans approximately 3 cm. A separate group of coarse heterogeneous calcifications are present in the inner right breast at anterior depth measuring approximately 3 - 4 mm. A group of heterogeneous calcifications is present in the retroareolar left breast at far posterior depth measuring approximately 11 mm. No suspicious findings elsewhere in the left breast. Mammographic images were processed with CAD. On physical exam, there is a palpable firm approximate 10 cm mass involving the upper outer and lower outer quadrants of the right breast, associated with skin retraction and nipple retraction. I do not palpate discrete right axillary lymphadenopathy. Targeted right breast ultrasound is performed, showing a large hypoechoic mass with irregular margins which encompasses the upper outer quadrant and to a lesser degree the lower outer quadrant, demonstrating acoustic shadowing and internal power Doppler flow. The mass extends to the skin surface in the lower outer quadrant at the 7 o'clock position and in the outer breast at the 9 o'clock position. Accurate measurements are difficult with ultrasound due to the large size, with the best estimate of maximum length on Seascape images approximating 8 cm (the mammographic size measurements are likely more accurate). Sonographic evaluation of the right axilla  demonstrates 2 adjacent pathologic lymph nodes in the low axilla, the larger of the two measuring approximately 2.4 cm. IMPRESSION: 1. Highly suspicious large right breast mass approximating 10 cm maximally with involvement of the upper outer quadrant and lower outer quadrant. The mass demonstrates skin involvement, accounting for the skin retraction. 2. Two adjacent pathologic low right axillary lymph nodes. 3. Indeterminate calcifications involving the inner right breast at anterior depth. 4. Indeterminate calcifications involving the retroareolar left breast  at far posterior depth. RECOMMENDATION: 1. Two separate ultrasound-guided core needle biopsies of the large right breast mass. The portions of the mass in the upper outer quadrant and in the lower outer quadrant should both be biopsied. 2. Ultrasound-guided core needle biopsy of 1 of the pathologic right axillary lymph nodes. 3. Stereotactic tomosynthesis core needle biopsy of the indeterminate calcifications in the inner right breast. 4. Stereotactic tomosynthesis core needle biopsy of the indeterminate calcifications in the retroareolar left breast at posterior depth. The core needle biopsy procedures were discussed with the patient and her questions were answered. She has agreed to proceed. The ultrasound-guided core needle biopsies have been scheduled for Monday 10/07/2016 at 8:30 a.m. The stereotactic tomosynthesis core needle biopsies have been scheduled for Tuesday 10/08/2016 at 8:30 a.m. I have discussed the findings and recommendations with the patient. Results were also provided in writing at the conclusion of the visit. BI-RADS CATEGORY  5: Highly suggestive of malignancy. Electronically Signed   By: Evangeline Dakin M.D.   On: 10/03/2016 13:15   Mm Diag Breast Tomo Bilateral  Result Date: 10/03/2016 CLINICAL DATA:  71 year old who felt a palpable lump in the outer right breast initially approximately 4 months ago. She has an inverted right  nipple though she states the right nipple is chronically inverted. She has not had a mammogram since 2001. Annual evaluation, left breast. Family history of breast cancer in her mother at unknown age and in a cousin at age 51. EXAM: 2D DIGITAL DIAGNOSTIC BILATERAL MAMMOGRAM WITH CAD AND ADJUNCT TOMO ULTRASOUND RIGHT BREAST COMPARISON:  None.  The 2001 mammograms have been purged. ACR Breast Density Category b: There are scattered areas of fibroglandular density. FINDINGS: Standard 2D and tomosynthesis full field CC and MLO views of both breasts were obtained. A standard and tomosynthesis spot tangential view of the area of palpable concern in the right breast and standard spot magnification views of calcifications in both breasts were obtained. A large mass/focal asymmetry is present in the outer right breast extending from anterior to posterior depth, measuring at least 10 x 5 x 4 cm, involving the upper outer and lower-outer quadrants, associated with skin retraction. There is a group of fine heterogeneous calcifications immediately inferior and medial (contiguous) to the mass/asymmetry which spans approximately 3 cm. A separate group of coarse heterogeneous calcifications are present in the inner right breast at anterior depth measuring approximately 3 - 4 mm. A group of heterogeneous calcifications is present in the retroareolar left breast at far posterior depth measuring approximately 11 mm. No suspicious findings elsewhere in the left breast. Mammographic images were processed with CAD. On physical exam, there is a palpable firm approximate 10 cm mass involving the upper outer and lower outer quadrants of the right breast, associated with skin retraction and nipple retraction. I do not palpate discrete right axillary lymphadenopathy. Targeted right breast ultrasound is performed, showing a large hypoechoic mass with irregular margins which encompasses the upper outer quadrant and to a lesser degree the lower  outer quadrant, demonstrating acoustic shadowing and internal power Doppler flow. The mass extends to the skin surface in the lower outer quadrant at the 7 o'clock position and in the outer breast at the 9 o'clock position. Accurate measurements are difficult with ultrasound due to the large size, with the best estimate of maximum length on Seascape images approximating 8 cm (the mammographic size measurements are likely more accurate). Sonographic evaluation of the right axilla demonstrates 2 adjacent pathologic lymph nodes in the  low axilla, the larger of the two measuring approximately 2.4 cm. IMPRESSION: 1. Highly suspicious large right breast mass approximating 10 cm maximally with involvement of the upper outer quadrant and lower outer quadrant. The mass demonstrates skin involvement, accounting for the skin retraction. 2. Two adjacent pathologic low right axillary lymph nodes. 3. Indeterminate calcifications involving the inner right breast at anterior depth. 4. Indeterminate calcifications involving the retroareolar left breast at far posterior depth. RECOMMENDATION: 1. Two separate ultrasound-guided core needle biopsies of the large right breast mass. The portions of the mass in the upper outer quadrant and in the lower outer quadrant should both be biopsied. 2. Ultrasound-guided core needle biopsy of 1 of the pathologic right axillary lymph nodes. 3. Stereotactic tomosynthesis core needle biopsy of the indeterminate calcifications in the inner right breast. 4. Stereotactic tomosynthesis core needle biopsy of the indeterminate calcifications in the retroareolar left breast at posterior depth. The core needle biopsy procedures were discussed with the patient and her questions were answered. She has agreed to proceed. The ultrasound-guided core needle biopsies have been scheduled for Monday 10/07/2016 at 8:30 a.m. The stereotactic tomosynthesis core needle biopsies have been scheduled for Tuesday 10/08/2016  at 8:30 a.m. I have discussed the findings and recommendations with the patient. Results were also provided in writing at the conclusion of the visit. BI-RADS CATEGORY  5: Highly suggestive of malignancy. Electronically Signed   By: Evangeline Dakin M.D.   On: 10/03/2016 13:15   [place] Axillary Node Core Biopsy Right  Addendum Date: 10/08/2016   ADDENDUM REPORT: 10/08/2016 10:24 ADDENDUM: Pathology results for 3 ultrasound-guided biopsies performed 10/07/2016 are available. 1. Pathology result for a right breast biopsy in the lower outer quadrant 7 o'clock position shows grade 2 invasive mammary carcinoma. This is concordant with imaging findings. 2. Pathology results for a right breast biopsy in the upper outer quadrant 10 o'clock position shows grade 2 invasive mammary carcinoma. This is concordant with imaging findings. 3. Pathology results of a right axillary lymph node is positive for metastatic disease. Pathology results are concordant with imaging findings. I discussed pathology results with the patient in person today. She has had some soreness at the biopsy sites, but is otherwise doing well. She is seeing Dr. Lucia Gaskins in clinic for surgical consultation tomorrow, 10/09/2016. In discussion with Dr. Lucia Gaskins by telephone today, we will not proceed with a stereotactic biopsy of a small group of calcifications in the medial right breast today, given her biopsy proven malignancy in 2 quadrants of the right breast. Electronically Signed   By: Curlene Dolphin M.D.   On: 10/08/2016 10:24   Result Date: 10/08/2016 CLINICAL DATA:  The patient presents for ultrasound-guided biopsy of right axillary lymph node. EXAM: ULTRASOUND GUIDED CORE NEEDLE BIOPSY OF A RIGHT AXILLARY NODE COMPARISON:  Previous exam(s). FINDINGS: I met with the patient and we discussed the procedure of ultrasound-guided biopsy, including benefits and alternatives. We discussed the high likelihood of a successful procedure. We discussed the risks of  the procedure, including infection, bleeding, tissue injury, clip migration, and inadequate sampling. Informed written consent was given. The usual time-out protocol was performed immediately prior to the procedure. Using sterile technique and 1% Lidocaine as local anesthetic, under direct ultrasound visualization, a 14 gauge vacuum-assisted device was used to perform biopsy of enlarged right axillary lymph node using a lateral approach. At the conclusion of the procedure a spiral shaped tissue marker clip was deployed into the biopsy cavity. Follow up 2 view mammogram was performed  and dictated separately. IMPRESSION: Ultrasound guided biopsy of enlarged right axillary lymph node. No apparent complications. Electronically Signed: By: Nolon Nations M.D. On: 10/07/2016 09:40   Mm Clip Placement Left  Result Date: 10/08/2016 CLINICAL DATA:  Stereotactic biopsy was performed of calcifications in the upper central left breast, posterior third. EXAM: EXAM DIAGNOSTIC LEFT MAMMOGRAM POST STEREOTACTIC BIOPSY COMPARISON:  Previous exam(s). FINDINGS: Mammographic images were obtained following stereotactic guided biopsy of left breast calcifications. A coil shaped biopsy clip is satisfactorily positioned at the biopsy site in the posterior third of the upper central left breast. There are a few remaining calcifications at the biopsy site. IMPRESSION: Satisfactory position of coil shaped biopsy clip. Final Assessment: Post Procedure Mammograms for Marker Placement Electronically Signed   By: Curlene Dolphin M.D.   On: 10/08/2016 09:55   Mm Clip Placement Right  Result Date: 10/07/2016 CLINICAL DATA:  Status post ultrasound-guided core biopsy 2 sites in the right breast and right axillary lymph node. EXAM: DIAGNOSTIC RIGHT MAMMOGRAM POST ULTRASOUND BIOPSY x3 COMPARISON:  Previous exam(s). FINDINGS: Mammographic images were obtained following ultrasound guided biopsy of 2 sites in the right breast, in the 7 o'clock and  10 o'clock location. Ribbon shaped clip in the 7 o'clock location right breast projects in the retroareolar region. Coil shaped clip in the 10 o'clock location of the right breast projects in the upper outer quadrant. The 2 clips within the breast are approximately 3.2 cm apart. A spiral shaped clip in the right axilla is visualized on a spot view of the axilla. IMPRESSION: Tissue marker clips are in the expected locations after biopsy. Final Assessment: Post Procedure Mammograms for Marker Placement Electronically Signed   By: Nolon Nations M.D.   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 This was found to be concordant by Dr. Curlene Dolphin. Pathology results were discussed with the patient by telephone. The patient reported doing well after the biopsy with tenderness at the site. Post biopsy instructions and care were reviewed and questions were answered. The patient was encouraged to call [facility] for any additional concerns. The patient has a recent diagnosis of right breast cancer and should follow her outlined treatment plan. Pathology results reported by Terie Purser, RN on 10/09/2016. Electronically Signed   By: Curlene Dolphin M.D.   On: 10/09/2016 14:33   Result Date: 10/10/2016 CLINICAL DATA:  Stereotactic biopsy was suggested for calcifications in the upper central left breast, far posterior depth. Please note that the patient was also scheduled for biopsy of calcifications in the medial right breast. Pathology results of the 2 right breast biopsies, including the upper-outer quadrant and upper inner quadrant and of a right axillary lymph node were received today, demonstrating malignancy at all sites. I telephoned Dr. Lucia Gaskins, and it was decided not  to proceed with a right breast stereotactic biopsy today. EXAM: LEFT BREAST STEREOTACTIC CORE NEEDLE BIOPSY COMPARISON:  Previous exams. FINDINGS: The patient and I discussed the procedure of stereotactic-guided biopsy including benefits and alternatives. We discussed the high likelihood of a successful procedure. We discussed the risks of the procedure including infection, bleeding, tissue injury, clip migration, and inadequate sampling. Informed written consent was given. The usual time out protocol was performed immediately prior to the procedure. Using sterile  technique and 1% Lidocaine as local anesthetic, under stereotactic guidance, a 9 gauge vacuum assisted device was used to perform core needle biopsy of calcifications in the upper inner quadrant of the left breast using a superior to inferior approach. Specimen radiograph was performed showing multiple calcifications. Specimens with calcifications are identified for pathology. Lesion quadrant: Upper inner quadrant At the conclusion of the procedure, a coil shaped tissue marker clip was deployed into the biopsy cavity. Follow-up 2-view mammogram was performed and dictated separately. IMPRESSION: Stereotactic-guided biopsy of the left breast. No apparent complications. Electronically Signed: By: Curlene Dolphin M.D. On: 10/08/2016 10:18   [place] Rt Breast Bx W Loc Dev 1st Lesion Img Bx Spec US Guide  Addendum Date: 10/08/2016   ADDENDUM REPORT: 10/08/2016 10:24 ADDENDUM: Pathology results for 3 ultrasound-guided biopsies performed 10/07/2016 are available. 1. Pathology result for a right breast biopsy in the lower outer quadrant 7 o'clock position shows grade 2 invasive mammary carcinoma. This is concordant with imaging findings. 2. Pathology results for a right breast biopsy in the upper outer quadrant 10 o'clock position shows grade 2 invasive mammary carcinoma. This is concordant with imaging findings. 3. Pathology results of a right axillary lymph node is  positive for metastatic disease. Pathology results are concordant with imaging findings. I discussed pathology results with the patient in person today. She has had some soreness at the biopsy sites, but is otherwise doing well. She is seeing Dr. Lucia Gaskins in clinic for surgical consultation tomorrow, 10/09/2016. In discussion with Dr. Lucia Gaskins by telephone today, we will not proceed with a stereotactic biopsy of a small group of calcifications in the medial right breast today, given her biopsy proven malignancy in 2 quadrants of the right breast. Electronically Signed   By: Curlene Dolphin M.D.   On: 10/08/2016 10:24   Result Date: 10/08/2016 CLINICAL DATA:  Patient presents for ultrasound-guided core biopsy of right breast lesions. EXAM: ULTRASOUND GUIDED RIGHT BREAST CORE NEEDLE BIOPSY COMPARISON:  Previous exam(s). FINDINGS: I met with the patient and we discussed the procedure of ultrasound-guided biopsy, including benefits and alternatives. We discussed the high likelihood of a successful procedure. We discussed the risks of the procedure, including infection, bleeding, tissue injury, clip migration, and inadequate sampling. Informed written consent was given. The usual time-out protocol was performed immediately prior to the procedure. Lesion quadrant: Lower outer quadrant of the right breast Using sterile technique and 1% Lidocaine as local anesthetic, under direct ultrasound visualization, a 12 gauge spring-loaded device was used to perform biopsy of mass in the 7 o'clock location of the right breast using a lateral approach. At the conclusion of the procedure a ribbon shaped tissue marker clip was deployed into the biopsy cavity. Follow up 2 view mammogram was performed and dictated separately. IMPRESSION: Ultrasound guided biopsy of right breast mass. No apparent complications. Electronically Signed: By: Nolon Nations M.D. On: 10/07/2016 09:36   [place] Rt Breast Bx W Loc Dev Ea Add Lesion Img Bx Spec US  Guide  Addendum Date: 10/08/2016   ADDENDUM REPORT: 10/08/2016 10:24 ADDENDUM: Pathology results for 3 ultrasound-guided biopsies performed 10/07/2016 are available. 1. Pathology result for a right breast biopsy in the lower outer quadrant 7 o'clock position shows grade 2 invasive mammary carcinoma. This is concordant with imaging findings. 2. Pathology results for a right breast biopsy in the upper outer quadrant 10 o'clock position shows grade 2 invasive mammary carcinoma. This is concordant with imaging findings. 3. Pathology results of a right axillary lymph node  is positive for metastatic disease. Pathology results are concordant with imaging findings. I discussed pathology results with the patient in person today. She has had some soreness at the biopsy sites, but is otherwise doing well. She is seeing Dr. Lucia Gaskins in clinic for surgical consultation tomorrow, 10/09/2016. In discussion with Dr. Lucia Gaskins by telephone today, we will not proceed with a stereotactic biopsy of a small group of calcifications in the medial right breast today, given her biopsy proven malignancy in 2 quadrants of the right breast. Electronically Signed   By: Curlene Dolphin M.D.   On: 10/08/2016 10:24   Result Date: 10/08/2016 CLINICAL DATA:  The patient presents for ultrasound-guided core biopsy of right breast masses. EXAM: ULTRASOUND GUIDED RIGHT BREAST CORE NEEDLE BIOPSY COMPARISON:  Previous exam(s). FINDINGS: I met with the patient and we discussed the procedure of ultrasound-guided biopsy, including benefits and alternatives. We discussed the high likelihood of a successful procedure. We discussed the risks of the procedure, including infection, bleeding, tissue injury, clip migration, and inadequate sampling. Informed written consent was given. The usual time-out protocol was performed immediately prior to the procedure. Lesion quadrant: Upper-outer quadrant of the right breast Using sterile technique and 1% Lidocaine as local  anesthetic, under direct ultrasound visualization, a 12 gauge spring-loaded device was used to perform biopsy of mass the 10 o'clock location of the right breast using a lateral approach. At the conclusion of the procedure a coil shaped tissue marker clip was deployed into the biopsy cavity. Follow up 2 view mammogram was performed and dictated separately. IMPRESSION: Ultrasound guided biopsy of right breast mass. No apparent complications. Electronically Signed: By: Nolon Nations M.D. On: 10/07/2016 09:37       IMPRESSION/PLAN: 1. 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 Dr. Lisbeth Renshaw discusses the pathology findings and reviews the nature of locally advanced breast cancer. The consensus from the breast conference includes likely neoadjuvant  chemotherapy followed by mastectomy with axillary node assessment. The patient's course would then be followed by adjuvant external radiotherapy to the breast followed by antiestrogen therapy. We discussed the risks, benefits, short, and long term effects of radiotherapy, and the patient is interested in proceeding. Dr. Lisbeth Renshaw discusses the delivery and logistics of radiotherapy and would recommend 6 1/2 weeks to the chest wall and regional nodes. We will see her back about 2 weeks after surgery to move forward with the simulation and planning process and anticipate starting radiotherapy about 4 weeks after surgery.  2. Possible genetic predisposition to breast cancer. The patient has a strong family history of breast cancer and will be seen in genetics for testing.  The above documentation reflects my direct findings during this shared patient visit. Please see the separate note by Dr. Lisbeth Renshaw on this date for the remainder of the patient's plan of care.    Carola Rhine, PAC

## 2016-10-18 ENCOUNTER — Other Ambulatory Visit: Payer: Self-pay

## 2016-10-18 DIAGNOSIS — Z170 Estrogen receptor positive status [ER+]: Principal | ICD-10-CM

## 2016-10-18 DIAGNOSIS — C50511 Malignant neoplasm of lower-outer quadrant of right female breast: Secondary | ICD-10-CM

## 2016-10-21 ENCOUNTER — Other Ambulatory Visit (HOSPITAL_BASED_OUTPATIENT_CLINIC_OR_DEPARTMENT_OTHER): Payer: Medicare HMO

## 2016-10-21 ENCOUNTER — Ambulatory Visit (HOSPITAL_BASED_OUTPATIENT_CLINIC_OR_DEPARTMENT_OTHER): Payer: Medicare HMO | Admitting: Oncology

## 2016-10-21 ENCOUNTER — Ambulatory Visit
Admission: RE | Admit: 2016-10-21 | Discharge: 2016-10-21 | Disposition: A | Discharge status: Home / Self Care | Payer: Medicare HMO | Source: Ambulatory Visit | Attending: Surgery | Admitting: Surgery

## 2016-10-21 DIAGNOSIS — C50911 Malignant neoplasm of unspecified site of right female breast: Secondary | ICD-10-CM | POA: Diagnosis not present

## 2016-10-21 DIAGNOSIS — Z170 Estrogen receptor positive status [ER+]: Secondary | ICD-10-CM

## 2016-10-21 DIAGNOSIS — C50811 Malignant neoplasm of overlapping sites of right female breast: Secondary | ICD-10-CM

## 2016-10-21 DIAGNOSIS — C50411 Malignant neoplasm of upper-outer quadrant of right female breast: Secondary | ICD-10-CM | POA: Diagnosis not present

## 2016-10-21 DIAGNOSIS — C50511 Malignant neoplasm of lower-outer quadrant of right female breast: Secondary | ICD-10-CM

## 2016-10-21 LAB — CBC WITH DIFFERENTIAL/PLATELET
BASO%: 0.5 % (ref 0.0–2.0)
BASOS ABS: 0 10*3/uL (ref 0.0–0.1)
EOS%: 1 % (ref 0.0–7.0)
Eosinophils Absolute: 0.1 10*3/uL (ref 0.0–0.5)
HCT: 41.7 % (ref 34.8–46.6)
HGB: 14.1 g/dL (ref 11.6–15.9)
LYMPH%: 43.9 % (ref 14.0–49.7)
MCH: 33.6 pg (ref 25.1–34.0)
MCHC: 33.9 g/dL (ref 31.5–36.0)
MCV: 99.1 fL (ref 79.5–101.0)
MONO#: 0.4 10*3/uL (ref 0.1–0.9)
MONO%: 6.3 % (ref 0.0–14.0)
NEUT#: 2.7 10*3/uL (ref 1.5–6.5)
NEUT%: 48.3 % (ref 38.4–76.8)
Platelets: 186 10*3/uL (ref 145–400)
RBC: 4.2 10*6/uL (ref 3.70–5.45)
RDW: 12.6 % (ref 11.2–14.5)
WBC: 5.6 10*3/uL (ref 3.9–10.3)
lymph#: 2.4 10*3/uL (ref 0.9–3.3)

## 2016-10-21 LAB — COMPREHENSIVE METABOLIC PANEL
ALT: 43 U/L (ref 0–55)
AST: 50 U/L — AB (ref 5–34)
Albumin: 3.4 g/dL — ABNORMAL LOW (ref 3.5–5.0)
Alkaline Phosphatase: 95 U/L (ref 40–150)
Anion Gap: 8 mEq/L (ref 3–11)
BUN: 10.5 mg/dL (ref 7.0–26.0)
CHLORIDE: 105 meq/L (ref 98–109)
CO2: 27 mEq/L (ref 22–29)
Calcium: 9.4 mg/dL (ref 8.4–10.4)
Creatinine: 0.8 mg/dL (ref 0.6–1.1)
EGFR: 71 mL/min/{1.73_m2} — ABNORMAL LOW (ref >90)
GLUCOSE: 160 mg/dL — AB (ref 70–140)
POTASSIUM: 4.1 meq/L (ref 3.5–5.1)
SODIUM: 140 meq/L (ref 136–145)
Total Bilirubin: 0.64 mg/dL (ref 0.20–1.20)
Total Protein: 7.3 g/dL (ref 6.4–8.3)

## 2016-10-21 MED ORDER — GADOBENATE DIMEGLUMINE 529 MG/ML IV SOLN: 20.0000 mL | Freq: Once | INTRAVENOUS | Status: DC | PRN

## 2016-10-21 NOTE — Progress Notes (Signed)
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 When Jasmine Hood noted some significant changes in her right nipple Jasmine Hood saw [name] and was set up for bilateral diagnostic mammography with tomography and right breast [facility] Oct 03 2016. This found the breast density to be category B. In the upper and lower outer quadrants of the right breast there was a mass measuring at least 10 cm. There were also groups of heterogeneous calcifications measuring 3 cm and a separate group 0.4 cm. On physical exam there was a palpable firm mass measuring approximately 10 cm involving the upper outer and lower outer quadrant of the right breast, with skin reaction and nipple retraction. There was no palpable right axillary adenopathy.  Right breast ultrasonography confirmed a large hypoechoic mass with irregular margins extending to the skin surface. The right axilla showed 2 lymph nodes which appeared abnormal.  In the left breast there were some indeterminate retroareolar calcifications which were felt to warrant biopsy. This was performed 10/08/2016 and showed a fibroadenoma ([id number]).  Biopsy of the right breast mass at the 7:00 and 10:00 positions as well as one of the  suspicious lymph nodes all showed invasive ductal carcinoma, E-cadherin positive. Separate prognostic profiles from the 2 breast masses were sent. Estrogen receptor was positive at 95-70%, progesterone receptor was positive at 15-85%, all with strong staining intensity, the MIB-1 was 15-20%, and HER-2 was nonamplified, the signals ratio being 1.28-1.72 and the number per cell 1.85-3.95.  The patient's subsequent history is as detailed below  INTERVAL HISTORY: Jasmine Hood was evaluated in the breast clinic 10/21/2016. Her case was also presented in the multidisciplinary breast cancer conference 10/16/2016. At that time a preliminary plan was proposed: Genetics testing, breast MRI, neoadjuvant chemotherapy, staging studies, and eventually surgery and radiation before starting anti-estrogens  REVIEW OF SYSTEMS: Aside from the mass itself    There were no specific symptoms leading to the original mammogram, which was routinely scheduled. The patient denies unusual headaches, visual changes, nausea, vomiting, stiff neck, dizziness, or gait imbalance. There has been no cough, phlegm production, or pleurisy, no chest pain or pressure, and no change in bowel or bladder habits. The patient denies fever, rash, bleeding, unexplained fatigue or unexplained weight loss. A detailed review of systems was otherwise entirely negative.   PAST MEDICAL HISTORY: Past Medical History:  Diagnosis Date  . Breast cancer ([place]) 10/07/2016   right breast  . Jasmine Hood Shark recluse spider bite    right leg  . COLONIC POLYPS, HX OF 01/05/2009   Qualifier: Diagnosis of  By: [name]  MD, [name]   . Complication of anesthesia    slow to awaken after wisdom  teeth extraction age 71  . HYPOTHYROIDISM 02/01/2010   Qualifier: Diagnosis of  By: [name]  MD, [name]   . Morbid obesity ([place]) 01/05/2009   Qualifier: Diagnosis  of  By: [name]  MD, [name]   . Pneumonia 1987  . Sleep apnea    no cpap used     PAST SURGICAL  HISTORY: Past Surgical History:  Procedure Laterality Date  . BREAST BIOPSY Right 10/07/2016   invasive mammary carcinoma  . BREAST BIOPSY Left 10/08/2016   left  breast fibroadenoma no malignancy  . CATARACT EXTRACTION Bilateral   . COLONOSCOPY WITH PROPOFOL N/A 12/22/2015   Procedure: COLONOSCOPY WITH PROPOFOL;  Surgeon: [name], MD;  Location: [facility];  Service: Endoscopy;  Laterality: N/A;  . colonscopy  2012   with polyp removed  . surgery for spider bite  1998  . TUBAL LIGATION    . WISDOM TOOTH EXTRACTION  age 71    FAMILY HISTORY Family History  Problem Relation Age of Onset  . Breast cancer Mother        deceased at 5  . Throat cancer Father        smoker  . Rheum arthritis Sister        3 sisters pos. for osteo and RA  . Diabetes Maternal Aunt   . Cancer Maternal Grandfather        mouth cancer-chewed tobacco  . Breast cancer Cousin 51  . Breast cancer Daughter   The patient's father died at the age of 22 from laryngeal cancer in the setting of tobacco abuse. The patient's mother died at the age of 18 from metastatic breast cancer which had been diagnosed in her early 46s. The patient has 2 half-sisters and one half-brother. One of her sisters has "stomach" cancer. There is also a cousin on the maternal side with breast cancer diagnosed at age 57. No family member has been genetically tested yet  GYNECOLOGIC HISTORY:  No LMP recorded. Patient is postmenopausal. Menarche age 49, first live birth age 46, Jasmine Hood is [place] P5. Jasmine Hood is status post bilateral tubal ligation. Jasmine Hood stopped having periods in 1985. Jasmine Hood tells me Jasmine Hood did not use hormone replacement. However a note from her bone density scan December 2001 states "this patient began hormone replacement therapy 3 months ago"  SOCIAL HISTORY:  Jasmine Hood is originally from [place]. 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 Jasmine Hood has survived 2 husband's period at home currently is just Jasmine Hood and her daughter  Jasmine Hood. Jasmine Hood tells me this daughter had mild brain damage at birth and is not working, and cannot drive because of a history of seizures. However Jasmine Hood is the one Jasmine Hood is planning to name is her healthcare power of attorney. The patient has 13 grandchildren and 4 great-grandchildren. Jasmine Hood attends a [employer]: At the 10/21/2016 visit the patient was given the appropriate documents to complete and notarize at her discretion   HEALTH MAINTENANCE: Social History  Substance Use Topics  . Smoking status: Never Smoker  . Smokeless tobacco: Never Used  . Alcohol use No     Colonoscopy: 12/22/2015/Jasmine Hood  PAP:  Bone density: December 2001 was normal   Allergies  Allergen Reactions  . Bee Venom Anaphylaxis    Current Outpatient Prescriptions  Medication Sig Dispense Refill  . acetaminophen (TYLENOL) 325 MG tablet Take 325 mg by mouth every 6 (six) hours as needed for mild pain.    Marland Kitchen EPINEPHrine (EPIPEN 2-PAK) 0.3 mg/0.3 mL SOAJ injection Inject 0.3 mLs (0.3 mg total) into the muscle once. (Patient not taking: Reported on 10/21/2016) 1 Device 2   No  current facility-administered medications for this visit.    Facility-Administered Medications Ordered in Other Visits  Medication Dose Route Frequency Provider Last Rate Last Dose  . gadobenate dimeglumine (MULTIHANCE) injection 20 mL  20 mL Intravenous Once PRN Alphonsa Overall, MD        OBJECTIVE: Morbidly obese white woman in no acute distress  Vitals:   10/21/16 1517  BP: (!) 180/70  Pulse: 82  Resp: 18  Temp: 97.3 F (36.3 C)     Body mass index is 50.84 kg/m.    ECOG FS:1 - Symptomatic but completely ambulatory  Ocular: Sclerae unicteric, pupils round and equal Ear-nose-throat: Oropharynx clear and moist Lymphatic: No cervical or supraclavicular adenopathy Lungs no rales or rhonchi Heart regular rate and rhythm Abd soft, nontender, positive bowel sounds MSK no focal spinal  tenderness, no joint edema Neuro: non-focal, well-oriented, appropriate affect Breasts: The right breast shows an altered profile with the nipple pointing downward. There is also nipple erosion. This is photographed below. The mass in the right breast is movable, and extends easily over a 10 cm area. There is however no overlying erythema. The right axilla is benign. The left breast is unremarkable. The left axilla is benign.  Photo right breast 10/21/2016    LAB RESULTS:  CMP     Component Value Date/Time   NA 140 10/21/2016 1450   K 4.1 10/21/2016 1450   CL 107 04/28/2015 1133   CO2 27 10/21/2016 1450   GLUCOSE 160 (H) 10/21/2016 1450   BUN 10.5 10/21/2016 1450   CREATININE 0.8 10/21/2016 1450   CALCIUM 9.4 10/21/2016 1450   PROT 7.3 10/21/2016 1450   ALBUMIN 3.4 (L) 10/21/2016 1450   AST 50 (H) 10/21/2016 1450   ALT 43 10/21/2016 1450   ALKPHOS 95 10/21/2016 1450   BILITOT 0.64 10/21/2016 1450   GFRNONAA 102.34 01/25/2010 0858   GFRAA  11/24/2008 1315    >60        The eGFR has been calculated using the MDRD equation. This calculation has not been validated in all clinical situations. eGFR's persistently <60 mL/min signify possible Chronic Kidney Disease.    No results found for: TOTALPROTELP, ALBUMINELP, A1GS, A2GS, BETS, BETA2SER, GAMS, MSPIKE, SPEI  No results found for: Nils Pyle, [facility]  Lab Results  Component Value Date   WBC 5.6 10/21/2016   NEUTROABS 2.7 10/21/2016   HGB 14.1 10/21/2016   HCT 41.7 10/21/2016   MCV 99.1 10/21/2016   PLT 186 10/21/2016      Chemistry      Component Value Date/Time   NA 140 10/21/2016 1450   K 4.1 10/21/2016 1450   CL 107 04/28/2015 1133   CO2 27 10/21/2016 1450   BUN 10.5 10/21/2016 1450   CREATININE 0.8 10/21/2016 1450      Component Value Date/Time   CALCIUM 9.4 10/21/2016 1450   ALKPHOS 95 10/21/2016 1450   AST 50 (H) 10/21/2016 1450   ALT 43 10/21/2016 1450   BILITOT 0.64 10/21/2016  1450       No results found for: LABCA2  No components found for: OVFIEP329  No results for input(s): INR in the last 168 hours.  Urinalysis    Component Value Date/Time   COLORURINE LT. YELLOW 01/25/2010 0858   APPEARANCEUR CLEAR 01/25/2010 0858   LABSPEC >=1.030 01/25/2010 0858   PHURINE 5.5 01/25/2010 0858   GLUCOSEU NEGATIVE 01/25/2010 0858   HGBUR 1+ 12/30/2008 0838   BILIRUBINUR Neg 04/28/2015 1122   KETONESUR 15 (?)  01/25/2010 0858   PROTEINUR Neg 04/28/2015 1122   PROTEINUR NEGATIVE 11/24/2008 1545   UROBILINOGEN 0.2 04/28/2015 1122   UROBILINOGEN 0.2 01/25/2010 0858   NITRITE Neg 04/28/2015 1122   NITRITE NEGATIVE 01/25/2010 0858   LEUKOCYTESUR small (1+) (A) 04/28/2015 1122     STUDIES: Mr Breast Bilateral W Wo Contrast  Result Date: 10/21/2016 CLINICAL DATA:  Malignant neoplasm of right breast, stage II. Family history of breast cancer (mother and daughter). Patient had 3 ultrasound-guided biopsies on 10/07/2016 (site 1: Right breast, lower outer quadrant, 7 o'clock position - grade 2 invasive mammary carcinoma. site 2: Right breast, upper outer quadrant, 10 o'clock position - grade 2 invasive mammary carcinoma. site 3: Right axillary lymph node - positive for metastatic disease) LABS:  GFR 91, BUN 11, creatinine 0.6 EXAM: BILATERAL BREAST MRI WITH AND WITHOUT CONTRAST TECHNIQUE: Multiplanar, multisequence MR images of both breasts were obtained prior to and following the intravenous administration of 20 ml of MultiHance. THREE-DIMENSIONAL MR IMAGE RENDERING ON INDEPENDENT WORKSTATION: Three-dimensional MR images were rendered by post-processing of the original MR data on an independent workstation. The three-dimensional MR images were interpreted, and findings are reported in the following complete MRI report for this study. Three dimensional images were evaluated at the independent DynaCad workstation COMPARISON:  Previous exams including diagnostic mammogram and  ultrasound dated 10/03/2016, ultrasound-guided biopsies of 10/07/2016 and stereotactic biopsy of 10/08/2016. FINDINGS: Breast composition: b. Scattered fibroglandular tissue. Background parenchymal enhancement: Mild Right breast: Extensive contiguous mass and non-mass enhancement throughout the outer right breast, upper outer quadrant and lower outer quadrant, greater than 10 cm anterior extent from posterior depth to anterior skin surface (with associated skin retraction), measuring approximately 4 cm greatest thickness and approximately 7 cm craniocaudal dimension, with mixed enhancement kinetics including washout. There are 2 sites of biopsy clip artifact at posterior depth, upper-outer quadrant and lower outer quadrant respectively, corresponding to the 2 sites of biopsy-proven carcinoma. The additional extensive mass and non-mass enhancement, extending from posterior depth to the anterior skin surface, is consistent with associated extensive multifocal and multicentric disease. Left breast: Linear clumped non-mass enhancement, including suspicious clustered ring enhancement at its posterior aspect, measuring 4.4 cm extent, with mixed enhancement kinetics including some washout (series 9, images 154-162). Post biopsy change within the posterior left breast corresponding to the site of recent benign breast biopsy (stereotactic biopsy for calcifications revealing fibroadenoma). Lymph nodes: 4 enlarged/morphologically abnormal level 1 lymph nodes within the right axilla, 1 of which with associated biopsy clip artifact corresponding to biopsy-proven metastatic disease. No enlarged or morphologically abnormal lymph nodes within the left axilla or bilateral internal mammary chain regions. Ancillary findings:  None. IMPRESSION: 1. Linear clumped non-mass enhancement (including highly suspicious clustered ring enhancement at its posterior aspect) within the upper LEFT breast, at ANTERIOR depth, measuring 4.4 cm extent.  This is a suspicious finding for contralateral disease and MRI-guided biopsy is recommended. 2. Extensive contiguous mass and non-mass enhancement throughout the outer right breast, involving the upper-outer quadrant and lower outer quadrants, measuring greater than 10 cm anterior extend from posterior depth to anterior skin surface (with associated skin retraction), including 2 sites of biopsy-proven carcinoma at posterior depth with associated biopsy clip artifacts. The additional contiguous mass and non-mass enhancement throughout the outer right breast is consistent with extensive multifocal and multicentric disease. 3. At least 4 enlarged/morphologically abnormal level 1 lymph nodes within the right axilla. 4. Post biopsy change within the left breast at POSTERIOR depth, corresponding to the site  of benign stereotactic biopsy for calcifications with pathology result of fibroadenoma. RECOMMENDATION: MRI-guided biopsy for the suspicious linear clumped non-mass enhancement (including suspicious clustered ring enhancement posteriorly) within the upper LEFT breast, at anterior depth, with preferential sampling of the clustered ring enhancement if possible. BI-RADS CATEGORY  4: Suspicious. Electronically Signed   By: Franki Cabot M.D.   On: 10/21/2016 11:35   US Breast Ltd Uni Right Inc Axilla  Result Date: 10/03/2016 CLINICAL DATA:  71 year old who felt a palpable lump in the outer right breast initially approximately 4 months ago. Jasmine Hood has an inverted right nipple though Jasmine Hood states the right nipple is chronically inverted. Jasmine Hood has not had a mammogram since 2001. Annual evaluation, left breast. Family history of breast cancer in her mother at unknown age and in a cousin at age 99. EXAM: 2D DIGITAL DIAGNOSTIC BILATERAL MAMMOGRAM WITH CAD AND ADJUNCT TOMO ULTRASOUND RIGHT BREAST COMPARISON:  None.  The 2001 mammograms have been purged. ACR Breast Density Category b: There are scattered areas of fibroglandular  density. FINDINGS: Standard 2D and tomosynthesis full field CC and MLO views of both breasts were obtained. A standard and tomosynthesis spot tangential view of the area of palpable concern in the right breast and standard spot magnification views of calcifications in both breasts were obtained. A large mass/focal asymmetry is present in the outer right breast extending from anterior to posterior depth, measuring at least 10 x 5 x 4 cm, involving the upper outer and lower-outer quadrants, associated with skin retraction. There is a group of fine heterogeneous calcifications immediately inferior and medial (contiguous) to the mass/asymmetry which spans approximately 3 cm. A separate group of coarse heterogeneous calcifications are present in the inner right breast at anterior depth measuring approximately 3 - 4 mm. A group of heterogeneous calcifications is present in the retroareolar left breast at far posterior depth measuring approximately 11 mm. No suspicious findings elsewhere in the left breast. Mammographic images were processed with CAD. On physical exam, there is a palpable firm approximate 10 cm mass involving the upper outer and lower outer quadrants of the right breast, associated with skin retraction and nipple retraction. I do not palpate discrete right axillary lymphadenopathy. Targeted right breast ultrasound is performed, showing a large hypoechoic mass with irregular margins which encompasses the upper outer quadrant and to a lesser degree the lower outer quadrant, demonstrating acoustic shadowing and internal power Doppler flow. The mass extends to the skin surface in the lower outer quadrant at the 7 o'clock position and in the outer breast at the 9 o'clock position. Accurate measurements are difficult with ultrasound due to the large size, with the best estimate of maximum length on Seascape images approximating 8 cm (the mammographic size measurements are likely more accurate). Sonographic  evaluation of the right axilla demonstrates 2 adjacent pathologic lymph nodes in the low axilla, the larger of the two measuring approximately 2.4 cm. IMPRESSION: 1. Highly suspicious large right breast mass approximating 10 cm maximally with involvement of the upper outer quadrant and lower outer quadrant. The mass demonstrates skin involvement, accounting for the skin retraction. 2. Two adjacent pathologic low right axillary lymph nodes. 3. Indeterminate calcifications involving the inner right breast at anterior depth. 4. Indeterminate calcifications involving the retroareolar left breast at far posterior depth. RECOMMENDATION: 1. Two separate ultrasound-guided core needle biopsies of the large right breast mass. The portions of the mass in the upper outer quadrant and in the lower outer quadrant should both be biopsied. 2. Ultrasound-guided  core needle biopsy of 1 of the pathologic right axillary lymph nodes. 3. Stereotactic tomosynthesis core needle biopsy of the indeterminate calcifications in the inner right breast. 4. Stereotactic tomosynthesis core needle biopsy of the indeterminate calcifications in the retroareolar left breast at posterior depth. The core needle biopsy procedures were discussed with the patient and her questions were answered. Jasmine Hood has agreed to proceed. The ultrasound-guided core needle biopsies have been scheduled for Monday 10/07/2016 at 8:30 a.m. The stereotactic tomosynthesis core needle biopsies have been scheduled for Tuesday 10/08/2016 at 8:30 a.m. I have discussed the findings and recommendations with the patient. Results were also provided in writing at the conclusion of the visit. BI-RADS CATEGORY  5: Highly suggestive of malignancy. Electronically Signed   By: Evangeline Dakin M.D.   On: 10/03/2016 13:15   Mm Diag Breast Tomo Bilateral  Result Date: 10/03/2016 CLINICAL DATA:  71 year old who felt a palpable lump in the outer right breast initially approximately 4 months  ago. Jasmine Hood has an inverted right nipple though Jasmine Hood states the right nipple is chronically inverted. Jasmine Hood has not had a mammogram since 2001. Annual evaluation, left breast. Family history of breast cancer in her mother at unknown age and in a cousin at age 36. EXAM: 2D DIGITAL DIAGNOSTIC BILATERAL MAMMOGRAM WITH CAD AND ADJUNCT TOMO ULTRASOUND RIGHT BREAST COMPARISON:  None.  The 2001 mammograms have been purged. ACR Breast Density Category b: There are scattered areas of fibroglandular density. FINDINGS: Standard 2D and tomosynthesis full field CC and MLO views of both breasts were obtained. A standard and tomosynthesis spot tangential view of the area of palpable concern in the right breast and standard spot magnification views of calcifications in both breasts were obtained. A large mass/focal asymmetry is present in the outer right breast extending from anterior to posterior depth, measuring at least 10 x 5 x 4 cm, involving the upper outer and lower-outer quadrants, associated with skin retraction. There is a group of fine heterogeneous calcifications immediately inferior and medial (contiguous) to the mass/asymmetry which spans approximately 3 cm. A separate group of coarse heterogeneous calcifications are present in the inner right breast at anterior depth measuring approximately 3 - 4 mm. A group of heterogeneous calcifications is present in the retroareolar left breast at far posterior depth measuring approximately 11 mm. No suspicious findings elsewhere in the left breast. Mammographic images were processed with CAD. On physical exam, there is a palpable firm approximate 10 cm mass involving the upper outer and lower outer quadrants of the right breast, associated with skin retraction and nipple retraction. I do not palpate discrete right axillary lymphadenopathy. Targeted right breast ultrasound is performed, showing a large hypoechoic mass with irregular margins which encompasses the upper outer quadrant  and to a lesser degree the lower outer quadrant, demonstrating acoustic shadowing and internal power Doppler flow. The mass extends to the skin surface in the lower outer quadrant at the 7 o'clock position and in the outer breast at the 9 o'clock position. Accurate measurements are difficult with ultrasound due to the large size, with the best estimate of maximum length on Seascape images approximating 8 cm (the mammographic size measurements are likely more accurate). Sonographic evaluation of the right axilla demonstrates 2 adjacent pathologic lymph nodes in the low axilla, the larger of the two measuring approximately 2.4 cm. IMPRESSION: 1. Highly suspicious large right breast mass approximating 10 cm maximally with involvement of the upper outer quadrant and lower outer quadrant. The mass demonstrates skin involvement, accounting  for the skin retraction. 2. Two adjacent pathologic low right axillary lymph nodes. 3. Indeterminate calcifications involving the inner right breast at anterior depth. 4. Indeterminate calcifications involving the retroareolar left breast at far posterior depth. RECOMMENDATION: 1. Two separate ultrasound-guided core needle biopsies of the large right breast mass. The portions of the mass in the upper outer quadrant and in the lower outer quadrant should both be biopsied. 2. Ultrasound-guided core needle biopsy of 1 of the pathologic right axillary lymph nodes. 3. Stereotactic tomosynthesis core needle biopsy of the indeterminate calcifications in the inner right breast. 4. Stereotactic tomosynthesis core needle biopsy of the indeterminate calcifications in the retroareolar left breast at posterior depth. The core needle biopsy procedures were discussed with the patient and her questions were answered. Jasmine Hood has agreed to proceed. The ultrasound-guided core needle biopsies have been scheduled for Monday 10/07/2016 at 8:30 a.m. The stereotactic tomosynthesis core needle biopsies have been  scheduled for Tuesday 10/08/2016 at 8:30 a.m. I have discussed the findings and recommendations with the patient. Results were also provided in writing at the conclusion of the visit. BI-RADS CATEGORY  5: Highly suggestive of malignancy. Electronically Signed   By: Evangeline Dakin M.D.   On: 10/03/2016 13:15   [place] Axillary Node Core Biopsy Right  Addendum Date: 10/08/2016   ADDENDUM REPORT: 10/08/2016 10:24 ADDENDUM: Pathology results for 3 ultrasound-guided biopsies performed 10/07/2016 are available. 1. Pathology result for a right breast biopsy in the lower outer quadrant 7 o'clock position shows grade 2 invasive mammary carcinoma. This is concordant with imaging findings. 2. Pathology results for a right breast biopsy in the upper outer quadrant 10 o'clock position shows grade 2 invasive mammary carcinoma. This is concordant with imaging findings. 3. Pathology results of a right axillary lymph node is positive for metastatic disease. Pathology results are concordant with imaging findings. I discussed pathology results with the patient in person today. Jasmine Hood has had some soreness at the biopsy sites, but is otherwise doing well. Jasmine Hood is seeing Dr. Lucia Gaskins in clinic for surgical consultation tomorrow, 10/09/2016. In discussion with Dr. Lucia Gaskins by telephone today, we will not proceed with a stereotactic biopsy of a small group of calcifications in the medial right breast today, given her biopsy proven malignancy in 2 quadrants of the right breast. Electronically Signed   By: Curlene Dolphin M.D.   On: 10/08/2016 10:24   Result Date: 10/08/2016 CLINICAL DATA:  The patient presents for ultrasound-guided biopsy of right axillary lymph node. EXAM: ULTRASOUND GUIDED CORE NEEDLE BIOPSY OF A RIGHT AXILLARY NODE COMPARISON:  Previous exam(s). FINDINGS: I met with the patient and we discussed the procedure of ultrasound-guided biopsy, including benefits and alternatives. We discussed the high likelihood of a successful  procedure. We discussed the risks of the procedure, including infection, bleeding, tissue injury, clip migration, and inadequate sampling. Informed written consent was given. The usual time-out protocol was performed immediately prior to the procedure. Using sterile technique and 1% Lidocaine as local anesthetic, under direct ultrasound visualization, a 14 gauge vacuum-assisted device was used to perform biopsy of enlarged right axillary lymph node using a lateral approach. At the conclusion of the procedure a spiral shaped tissue marker clip was deployed into the biopsy cavity. Follow up 2 view mammogram was performed and dictated separately. IMPRESSION: Ultrasound guided biopsy of enlarged right axillary lymph node. No apparent complications. Electronically Signed: By: Nolon Nations M.D. On: 10/07/2016 09:40   Mm Clip Placement Left  Result Date: 10/08/2016 CLINICAL DATA:  Stereotactic  biopsy was performed of calcifications in the upper central left breast, posterior third. EXAM: EXAM DIAGNOSTIC LEFT MAMMOGRAM POST STEREOTACTIC BIOPSY COMPARISON:  Previous exam(s). FINDINGS: Mammographic images were obtained following stereotactic guided biopsy of left breast calcifications. A coil shaped biopsy clip is satisfactorily positioned at the biopsy site in the posterior third of the upper central left breast. There are a few remaining calcifications at the biopsy site. IMPRESSION: Satisfactory position of coil shaped biopsy clip. Final Assessment: Post Procedure Mammograms for Marker Placement Electronically Signed   By: Curlene Dolphin M.D.   On: 10/08/2016 09:55   Mm Clip Placement Right  Result Date: 10/07/2016 CLINICAL DATA:  Status post ultrasound-guided core biopsy 2 sites in the right breast and right axillary lymph node. EXAM: DIAGNOSTIC RIGHT MAMMOGRAM POST ULTRASOUND BIOPSY x3 COMPARISON:  Previous exam(s). FINDINGS: Mammographic images were obtained following ultrasound guided biopsy of 2 sites in  the right breast, in the 7 o'clock and 10 o'clock location. Ribbon shaped clip in the 7 o'clock location right breast projects in the retroareolar region. Coil shaped clip in the 10 o'clock location of the right breast projects in the upper outer quadrant. The 2 clips within the breast are approximately 3.2 cm apart. A spiral shaped clip in the right axilla is visualized on a spot view of the axilla. IMPRESSION: Tissue marker clips are in the expected locations after biopsy. Final Assessment: Post Procedure Mammograms for Marker Placement Electronically Signed   By: Nolon Nations M.D.   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 This was found to be concordant by Dr. Curlene Dolphin. Pathology results were discussed with the patient by telephone. The patient reported doing well after the biopsy with tenderness at the site. Post biopsy instructions and care were reviewed and questions were answered. The patient was encouraged to call [facility] for any additional concerns. The patient has a recent diagnosis of right breast cancer and should follow her outlined treatment plan. Pathology results reported by Terie Purser, RN on 10/09/2016. Electronically Signed   By: Curlene Dolphin M.D.   On: 10/09/2016 14:33   Result Date: 10/10/2016 CLINICAL DATA:  Stereotactic biopsy was suggested for calcifications in the upper central left breast, far posterior depth. Please note that the patient was also scheduled for biopsy of calcifications in the medial right breast. Pathology results of the 2 right breast biopsies, including the upper-outer quadrant and upper inner quadrant and of a right axillary lymph node were received today, demonstrating malignancy at all sites. I  telephoned Dr. Lucia Gaskins, and it was decided not to proceed with a right breast stereotactic biopsy today. EXAM: LEFT BREAST STEREOTACTIC CORE NEEDLE BIOPSY COMPARISON:  Previous exams. FINDINGS: The patient and I discussed the procedure of stereotactic-guided biopsy including benefits and alternatives. We discussed the high likelihood of a successful procedure. We discussed the risks of the procedure including infection, bleeding, tissue injury, clip migration, and inadequate sampling. Informed written consent was given. The usual time out protocol was performed immediately prior to the procedure. Using sterile technique and 1% Lidocaine as local anesthetic, under stereotactic guidance, a 9 gauge vacuum assisted device was used to perform core needle biopsy of calcifications in the upper inner quadrant of the left breast using a superior to inferior approach.  Specimen radiograph was performed showing multiple calcifications. Specimens with calcifications are identified for pathology. Lesion quadrant: Upper inner quadrant At the conclusion of the procedure, a coil shaped tissue marker clip was deployed into the biopsy cavity. Follow-up 2-view mammogram was performed and dictated separately. IMPRESSION: Stereotactic-guided biopsy of the left breast. No apparent complications. Electronically Signed: By: Curlene Dolphin M.D. On: 10/08/2016 10:18   [place] Rt Breast Bx W Loc Dev 1st Lesion Img Bx Spec US Guide  Addendum Date: 10/08/2016   ADDENDUM REPORT: 10/08/2016 10:24 ADDENDUM: Pathology results for 3 ultrasound-guided biopsies performed 10/07/2016 are available. 1. Pathology result for a right breast biopsy in the lower outer quadrant 7 o'clock position shows grade 2 invasive mammary carcinoma. This is concordant with imaging findings. 2. Pathology results for a right breast biopsy in the upper outer quadrant 10 o'clock position shows grade 2 invasive mammary carcinoma. This is concordant with imaging findings. 3.  Pathology results of a right axillary lymph node is positive for metastatic disease. Pathology results are concordant with imaging findings. I discussed pathology results with the patient in person today. Jasmine Hood has had some soreness at the biopsy sites, but is otherwise doing well. Jasmine Hood is seeing Dr. Lucia Gaskins in clinic for surgical consultation tomorrow, 10/09/2016. In discussion with Dr. Lucia Gaskins by telephone today, we will not proceed with a stereotactic biopsy of a small group of calcifications in the medial right breast today, given her biopsy proven malignancy in 2 quadrants of the right breast. Electronically Signed   By: Curlene Dolphin M.D.   On: 10/08/2016 10:24   Result Date: 10/08/2016 CLINICAL DATA:  Patient presents for ultrasound-guided core biopsy of right breast lesions. EXAM: ULTRASOUND GUIDED RIGHT BREAST CORE NEEDLE BIOPSY COMPARISON:  Previous exam(s). FINDINGS: I met with the patient and we discussed the procedure of ultrasound-guided biopsy, including benefits and alternatives. We discussed the high likelihood of a successful procedure. We discussed the risks of the procedure, including infection, bleeding, tissue injury, clip migration, and inadequate sampling. Informed written consent was given. The usual time-out protocol was performed immediately prior to the procedure. Lesion quadrant: Lower outer quadrant of the right breast Using sterile technique and 1% Lidocaine as local anesthetic, under direct ultrasound visualization, a 12 gauge spring-loaded device was used to perform biopsy of mass in the 7 o'clock location of the right breast using a lateral approach. At the conclusion of the procedure a ribbon shaped tissue marker clip was deployed into the biopsy cavity. Follow up 2 view mammogram was performed and dictated separately. IMPRESSION: Ultrasound guided biopsy of right breast mass. No apparent complications. Electronically Signed: By: Nolon Nations M.D. On: 10/07/2016 09:36   [place] Rt  Breast Bx W Loc Dev Ea Add Lesion Img Bx Spec US Guide  Addendum Date: 10/08/2016   ADDENDUM REPORT: 10/08/2016 10:24 ADDENDUM: Pathology results for 3 ultrasound-guided biopsies performed 10/07/2016 are available. 1. Pathology result for a right breast biopsy in the lower outer quadrant 7 o'clock position shows grade 2 invasive mammary carcinoma. This is concordant with imaging findings. 2. Pathology results for a right breast biopsy in the upper outer quadrant 10 o'clock position shows grade 2 invasive mammary carcinoma. This is concordant with imaging findings. 3. Pathology results of a right axillary lymph node is positive for metastatic disease. Pathology results are concordant with imaging findings. I discussed pathology results with the patient in person today. Jasmine Hood has had some soreness at the biopsy sites, but is otherwise doing well. Jasmine Hood is seeing Dr.  Newman in clinic for surgical consultation tomorrow, 10/09/2016. In discussion with Dr. Lucia Gaskins by telephone today, we will not proceed with a stereotactic biopsy of a small group of calcifications in the medial right breast today, given her biopsy proven malignancy in 2 quadrants of the right breast. Electronically Signed   By: Curlene Dolphin M.D.   On: 10/08/2016 10:24   Result Date: 10/08/2016 CLINICAL DATA:  The patient presents for ultrasound-guided core biopsy of right breast masses. EXAM: ULTRASOUND GUIDED RIGHT BREAST CORE NEEDLE BIOPSY COMPARISON:  Previous exam(s). FINDINGS: I met with the patient and we discussed the procedure of ultrasound-guided biopsy, including benefits and alternatives. We discussed the high likelihood of a successful procedure. We discussed the risks of the procedure, including infection, bleeding, tissue injury, clip migration, and inadequate sampling. Informed written consent was given. The usual time-out protocol was performed immediately prior to the procedure. Lesion quadrant: Upper-outer quadrant of the right breast  Using sterile technique and 1% Lidocaine as local anesthetic, under direct ultrasound visualization, a 12 gauge spring-loaded device was used to perform biopsy of mass the 10 o'clock location of the right breast using a lateral approach. At the conclusion of the procedure a coil shaped tissue marker clip was deployed into the biopsy cavity. Follow up 2 view mammogram was performed and dictated separately. IMPRESSION: Ultrasound guided biopsy of right breast mass. No apparent complications. Electronically Signed: By: Nolon Nations M.D. On: 10/07/2016 09:37    ELIGIBLE FOR AVAILABLE RESEARCH PROTOCOL: no  ASSESSMENT: 71 y.o. [place], [place] woman status post right breast overlapping sites biopsy 2 and lymph node biopsy 10/07/2016 all positive for an invasive ductal carcinoma, grade 2, estrogen and progesterone receptor positive, HER-2 nonamplified, with an MIB-1 between 15 and 20%.--This is clinical stage IIIB  (1) genetics testing pending  (2) neoadjuvant treatment to consist of cyclophosphamide, methotrexate, and fluorouracil (CMF) chemotherapy every 21 days 8  (3) definitive surgery to follow  (4) adjuvant radiation to follow surgery  (5) continue anti-estrogen therapy a minimum of 5 years  PLAN: We spent the better part of today's hour-long appointment discussing the biology of breast cancer in general, and the specifics of the patient's tumor in particular. We discussed the difference between local and systemic therapy. In terms of loco-regional treatment, lumpectomy plus radiation is equivalent to mastectomy as far as survival is concerned. However lumpectomy may not be possible in cases of very extensive tumor in the breast as this seems to be. This is one of the reasons why we recommend neoadjuvant chemotherapy: It may optimize the patient's chances to keep her breasts if Jasmine Hood wishes to do so  A second reason for neoadjuvant chemotherapy is the fact that this patient's family history  strongly suggests a deleterious mutation. If this is documented by genetics testing, the patient tells me Jasmine Hood would be interested in bilateral mastectomies. Accordingly the final surgical decision will depend on genetics testing and it will be several weeks before any results are available. If Jasmine Hood is treated neoadjuvantly Jasmine Hood will be receiving systemic therapy while waiting for those results.  Amanii understands that there is no survival advantage to starting with surgery as opposed to starting with chemotherapy and then following with surgery. Jasmine Hood is eager to get started with chemotherapy and we discussed chemotherapy side effects, toxicities and complications.  Given this patient's comorbidities I think CMF may be a better choice for her. Our tentative starting date is 11/05/2016. Jasmine Hood will need a port and Jasmine Hood will come to chemotherapy  school before starting chemotherapy. Jasmine Hood'll then will see me on the morning of 11/05/2016 to receive instructions on how to take her supportive medications.  Before proceeding that far however it would be prudent to stage this stage III person. I written for her to have a CT of the chest and a bone scan next week.  Once the patient completes her neoadjuvant chemotherapy and undergo surgery, Jasmine Hood will benefit from adjuvant radiation. Jasmine Hood has already discussed this with Dr. Lisbeth Renshaw. After that Jasmine Hood was started on anastrozole which Jasmine Hood most likely will take for 7 years.  Milanie has a good understanding of the overall plan. Jasmine Hood agrees with it. Jasmine Hood knows the goal of treatment in her case is cure. Jasmine Hood will call with any problems that may develop before her next visit here.  Chauncey Cruel, MD   10/21/2016 5:50 PM Medical Oncology and Hematology [facility] [address] [place], [place] [address] Tel. [phone]    Fax. [phone]

## 2016-10-21 NOTE — Progress Notes (Signed)
START OFF PATHWAY REGIMEN - Breast   [id number]:CMF (IV cyclophosphamide) q21 days:   A cycle is every 21 days:     Cyclophosphamide      Methotrexate      5-Fluorouracil   **Always confirm dose/schedule in your pharmacy ordering system**    Patient Characteristics: Preoperative or Nonsurgical Candidate (Clinical Staging), Neoadjuvant Therapy followed by Surgery, Invasive Disease, Chemotherapy, HER2 Negative/Unknown/Equivocal, ER Positive Therapeutic Status: Preoperative or Nonsurgical Candidate (Clinical Staging) AJCC M Category: cM0 AJCC Grade: G2 Breast Surgical Plan: Neoadjuvant Therapy followed by Surgery ER Status: Positive (+) AJCC 8 Stage Grouping: IIIB HER2 Status: Negative (-) AJCC T Category: cT4 AJCC N Category: cN1 PR Status: Positive (+)  Intent of Therapy: Curative Intent, Discussed with Patient

## 2016-10-22 ENCOUNTER — Other Ambulatory Visit: Payer: Self-pay | Admitting: Surgery

## 2016-10-23 ENCOUNTER — Other Ambulatory Visit: Payer: Self-pay | Admitting: Surgery

## 2016-10-23 ENCOUNTER — Encounter (HOSPITAL_COMMUNITY): Payer: Self-pay | Admitting: *Deleted

## 2016-10-23 ENCOUNTER — Encounter: Payer: Self-pay | Admitting: *Deleted

## 2016-10-25 ENCOUNTER — Other Ambulatory Visit: Payer: Medicare HMO

## 2016-10-28 ENCOUNTER — Other Ambulatory Visit: Payer: Self-pay | Admitting: Surgery

## 2016-10-28 ENCOUNTER — Telehealth: Payer: Self-pay | Admitting: Internal Medicine

## 2016-10-28 ENCOUNTER — Other Ambulatory Visit: Payer: Self-pay | Admitting: Oncology

## 2016-10-28 DIAGNOSIS — N6320 Unspecified lump in the left breast, unspecified quadrant: Secondary | ICD-10-CM

## 2016-10-28 MED ORDER — DEXTROSE 5 % IV SOLN
3.0000 g | INTRAVENOUS | Status: AC
  Administered 2016-10-29: 3 g via INTRAVENOUS
  Filled 2016-10-28: qty 3

## 2016-10-28 NOTE — Telephone Encounter (Signed)
° ° °  Pt call to say she has changed pharmacies   [place]
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
## 2016-10-28 NOTE — Telephone Encounter (Signed)
Preferred pharmacy was changed to [facility] off of [address]

## 2016-10-29 ENCOUNTER — Encounter (HOSPITAL_COMMUNITY): Payer: Self-pay | Admitting: *Deleted

## 2016-10-29 ENCOUNTER — Ambulatory Visit (HOSPITAL_COMMUNITY): Payer: Medicare HMO | Admitting: Anesthesiology

## 2016-10-29 ENCOUNTER — Ambulatory Visit (HOSPITAL_COMMUNITY): Payer: Medicare HMO

## 2016-10-29 ENCOUNTER — Ambulatory Visit (HOSPITAL_COMMUNITY)
Admission: RE | Admit: 2016-10-29 | Discharge: 2016-10-29 | Disposition: A | Discharge status: Home / Self Care | Payer: Medicare HMO | Source: Ambulatory Visit | Attending: Surgery | Admitting: Surgery

## 2016-10-29 ENCOUNTER — Encounter: Payer: Self-pay | Admitting: Oncology

## 2016-10-29 ENCOUNTER — Encounter (HOSPITAL_COMMUNITY)
Admission: RE | Disposition: A | Discharge status: Home / Self Care | Payer: Self-pay | Source: Ambulatory Visit | Attending: Surgery

## 2016-10-29 DIAGNOSIS — C50911 Malignant neoplasm of unspecified site of right female breast: Secondary | ICD-10-CM | POA: Diagnosis not present

## 2016-10-29 DIAGNOSIS — G4733 Obstructive sleep apnea (adult) (pediatric): Secondary | ICD-10-CM | POA: Diagnosis not present

## 2016-10-29 DIAGNOSIS — Z452 Encounter for adjustment and management of vascular access device: Secondary | ICD-10-CM | POA: Diagnosis not present

## 2016-10-29 DIAGNOSIS — Z95828 Presence of other vascular implants and grafts: Secondary | ICD-10-CM

## 2016-10-29 DIAGNOSIS — E039 Hypothyroidism, unspecified: Secondary | ICD-10-CM | POA: Diagnosis not present

## 2016-10-29 DIAGNOSIS — D242 Benign neoplasm of left breast: Secondary | ICD-10-CM | POA: Insufficient documentation

## 2016-10-29 DIAGNOSIS — D225 Melanocytic nevi of trunk: Secondary | ICD-10-CM | POA: Insufficient documentation

## 2016-10-29 DIAGNOSIS — Z6841 Body Mass Index (BMI) 40.0 and over, adult: Secondary | ICD-10-CM | POA: Diagnosis not present

## 2016-10-29 DIAGNOSIS — L821 Other seborrheic keratosis: Secondary | ICD-10-CM | POA: Diagnosis not present

## 2016-10-29 HISTORY — PX: MOLE REMOVAL: SHX2046

## 2016-10-29 HISTORY — PX: PORTACATH PLACEMENT: SHX2246

## 2016-10-29 SURGERY — INSERTION, TUNNELED CENTRAL VENOUS DEVICE, WITH PORT
Anesthesia: General | Site: Chest

## 2016-10-29 MED ORDER — EPHEDRINE 5 MG/ML INJ
INTRAVENOUS | Status: AC
  Filled 2016-10-29: qty 10

## 2016-10-29 MED ORDER — LACTATED RINGERS IV SOLN
INTRAVENOUS | Status: DC
  Administered 2016-10-29 (×2): via INTRAVENOUS

## 2016-10-29 MED ORDER — LABETALOL HCL 5 MG/ML IV SOLN
5.0000 mg | Freq: Once | INTRAVENOUS | Status: AC
  Administered 2016-10-29: 5 mg via INTRAVENOUS

## 2016-10-29 MED ORDER — ONDANSETRON HCL 4 MG/2ML IJ SOLN: 4.0000 mg | Freq: Once | INTRAMUSCULAR | Status: DC | PRN

## 2016-10-29 MED ORDER — BUPIVACAINE-EPINEPHRINE (PF) 0.25% -1:200000 IJ SOLN
INTRAMUSCULAR | Status: AC
  Filled 2016-10-29: qty 30

## 2016-10-29 MED ORDER — MEPERIDINE HCL 50 MG/ML IJ SOLN: 6.2500 mg | INTRAMUSCULAR | Status: DC | PRN

## 2016-10-29 MED ORDER — BUPIVACAINE-EPINEPHRINE (PF) 0.25% -1:200000 IJ SOLN
INTRAMUSCULAR | Status: DC | PRN
  Administered 2016-10-29: 13 mL via PERINEURAL

## 2016-10-29 MED ORDER — DEXAMETHASONE SODIUM PHOSPHATE 10 MG/ML IJ SOLN
INTRAMUSCULAR | Status: DC | PRN
  Administered 2016-10-29: 10 mg via INTRAVENOUS

## 2016-10-29 MED ORDER — LIDOCAINE HCL (CARDIAC) 20 MG/ML IV SOLN
INTRAVENOUS | Status: DC | PRN
  Administered 2016-10-29: 100 mg via INTRAVENOUS

## 2016-10-29 MED ORDER — DEXAMETHASONE SODIUM PHOSPHATE 10 MG/ML IJ SOLN
INTRAMUSCULAR | Status: AC
  Filled 2016-10-29: qty 1

## 2016-10-29 MED ORDER — CHLORHEXIDINE GLUCONATE CLOTH 2 % EX PADS: 6.0000 | MEDICATED_PAD | Freq: Once | CUTANEOUS | Status: DC

## 2016-10-29 MED ORDER — PROPOFOL 10 MG/ML IV BOLUS
INTRAVENOUS | Status: AC
  Filled 2016-10-29: qty 20

## 2016-10-29 MED ORDER — ONDANSETRON HCL 4 MG/2ML IJ SOLN
INTRAMUSCULAR | Status: AC
  Filled 2016-10-29: qty 2

## 2016-10-29 MED ORDER — FENTANYL CITRATE (PF) 100 MCG/2ML IJ SOLN
INTRAMUSCULAR | Status: AC
  Filled 2016-10-29: qty 2

## 2016-10-29 MED ORDER — HYDROMORPHONE HCL 1 MG/ML IJ SOLN: 0.2500 mg | INTRAMUSCULAR | Status: DC | PRN

## 2016-10-29 MED ORDER — HEPARIN SODIUM (PORCINE) 5000 UNIT/ML IJ SOLN
Freq: Once | INTRAMUSCULAR | Status: AC
  Administered 2016-10-29: 500 mL
  Filled 2016-10-29: qty 1.2

## 2016-10-29 MED ORDER — ONDANSETRON HCL 4 MG/2ML IJ SOLN
INTRAMUSCULAR | Status: DC | PRN
  Administered 2016-10-29: 4 mg via INTRAVENOUS

## 2016-10-29 MED ORDER — FENTANYL CITRATE (PF) 100 MCG/2ML IJ SOLN
INTRAMUSCULAR | Status: DC | PRN
  Administered 2016-10-29 (×4): 25 ug via INTRAVENOUS

## 2016-10-29 MED ORDER — LABETALOL HCL 5 MG/ML IV SOLN
INTRAVENOUS | Status: AC
  Filled 2016-10-29: qty 4

## 2016-10-29 MED ORDER — PROPOFOL 10 MG/ML IV BOLUS
INTRAVENOUS | Status: DC | PRN
  Administered 2016-10-29: 150 mg via INTRAVENOUS

## 2016-10-29 MED ORDER — HEPARIN SOD (PORK) LOCK FLUSH 100 UNIT/ML IV SOLN
INTRAVENOUS | Status: AC
  Filled 2016-10-29: qty 5

## 2016-10-29 MED ORDER — LIDOCAINE 2% (20 MG/ML) 5 ML SYRINGE
INTRAMUSCULAR | Status: AC
  Filled 2016-10-29: qty 5

## 2016-10-29 MED ORDER — GABAPENTIN 300 MG PO CAPS
300.0000 mg | ORAL_CAPSULE | ORAL | Status: AC
  Administered 2016-10-29: 300 mg via ORAL
  Filled 2016-10-29: qty 1

## 2016-10-29 MED ORDER — HEPARIN SOD (PORK) LOCK FLUSH 100 UNIT/ML IV SOLN
INTRAVENOUS | Status: DC | PRN
  Administered 2016-10-29: 400 [IU] via INTRAVENOUS

## 2016-10-29 MED ORDER — HYDROCODONE-ACETAMINOPHEN 5-325 MG PO TABS: 1.0000 | ORAL_TABLET | Freq: Four times a day (QID) | ORAL | 0 refills | Status: DC | PRN

## 2016-10-29 MED ORDER — EPHEDRINE SULFATE 50 MG/ML IJ SOLN
INTRAMUSCULAR | Status: DC | PRN
  Administered 2016-10-29 (×2): 10 mg via INTRAVENOUS
  Administered 2016-10-29: 5 mg via INTRAVENOUS

## 2016-10-29 MED ORDER — ACETAMINOPHEN 500 MG PO TABS
1000.0000 mg | ORAL_TABLET | ORAL | Status: AC
  Administered 2016-10-29: 1000 mg via ORAL
  Filled 2016-10-29: qty 2

## 2016-10-29 SURGICAL SUPPLY — 33 items
ADH SKN CLS APL DERMABOND .7 (GAUZE/BANDAGES/DRESSINGS) ×1
APL SKNCLS STERI-STRIP NONHPOA (GAUZE/BANDAGES/DRESSINGS)
BAG DECANTER FOR FLEXI CONT (MISCELLANEOUS) ×3 IMPLANT
BENZOIN TINCTURE PRP APPL 2/3 (GAUZE/BANDAGES/DRESSINGS) IMPLANT
BLADE SURG 15 STRL LF DISP TIS (BLADE) ×2 IMPLANT
BLADE SURG 15 STRL SS (BLADE) ×3
CHLORAPREP W/TINT 26ML (MISCELLANEOUS) ×3 IMPLANT
COVER PROBE U/S 5X48 (MISCELLANEOUS) ×3 IMPLANT
COVER SURGICAL LIGHT HANDLE (MISCELLANEOUS) ×3 IMPLANT
DECANTER SPIKE VIAL GLASS SM (MISCELLANEOUS) IMPLANT
DERMABOND ADVANCED (GAUZE/BANDAGES/DRESSINGS) ×1
DERMABOND ADVANCED .7 DNX12 (GAUZE/BANDAGES/DRESSINGS) ×2 IMPLANT
DRAPE C-ARM 42X120 X-RAY (DRAPES) ×3 IMPLANT
DRAPE LAPAROSCOPIC ABDOMINAL (DRAPES) ×3 IMPLANT
ELECT PENCIL ROCKER SW 15FT (MISCELLANEOUS) ×3 IMPLANT
ELECT REM PT RETURN 15FT ADLT (MISCELLANEOUS) ×3 IMPLANT
GAUZE SPONGE 2X2 8PLY STRL LF (GAUZE/BANDAGES/DRESSINGS) IMPLANT
GAUZE SPONGE 4X4 16PLY XRAY LF (GAUZE/BANDAGES/DRESSINGS) ×3 IMPLANT
GLOVE SURG SIGNA 7.5 PF LTX (GLOVE) ×3 IMPLANT
GOWN STRL REUS W/TWL XL LVL3 (GOWN DISPOSABLE) ×9 IMPLANT
KIT BASIN OR (CUSTOM PROCEDURE TRAY) ×3 IMPLANT
KIT PORT POWER 8FR ISP CVUE (Catheter) ×3 IMPLANT
NEEDLE HYPO 25X1 1.5 SAFETY (NEEDLE) ×3 IMPLANT
PACK BASIC VI WITH GOWN DISP (CUSTOM PROCEDURE TRAY) ×3 IMPLANT
PAD ION RIGHT ARM DISP (MISCELLANEOUS) ×3 IMPLANT
PADDING ION DISPOSABLE ARM LT (MISCELLANEOUS) ×3 IMPLANT
SPONGE GAUZE 2X2 STER 10/PKG (GAUZE/BANDAGES/DRESSINGS)
SUT MNCRL AB 4-0 PS2 18 (SUTURE) ×3 IMPLANT
SUT VIC AB 3-0 SH 18 (SUTURE) ×3 IMPLANT
SYR 10ML ECCENTRIC (SYRINGE) ×3 IMPLANT
SYR CONTROL 10ML LL (SYRINGE) ×3 IMPLANT
TOWEL OR 17X26 10 PK STRL BLUE (TOWEL DISPOSABLE) ×3 IMPLANT
TOWEL OR NON WOVEN STRL DISP B (DISPOSABLE) ×3 IMPLANT

## 2016-10-29 NOTE — Transfer of Care (Signed)
Immediate Anesthesia Transfer of Care Note  Patient: Jasmine Hood  Procedure(s) Performed: Procedure(s): INSERTION PORT-A-CATH (N/A) MOLE REMOVAL LEFT CHEST  Patient Location: PACU  Anesthesia Type:General  Level of Consciousness: awake, alert  and oriented  Airway & Oxygen Therapy: Patient Spontanous Breathing and Patient connected to face mask oxygen  Post-op Assessment: Report given to RN and Post -op Vital signs reviewed and stable  Post vital signs: Reviewed and stable  Last Vitals:  Vitals:   10/29/16 0728  BP: (!) 168/88  Pulse: 80  Resp: 18  Temp: 36.7 C    Last Pain:  Vitals:   10/29/16 0728  TempSrc: Oral      Patients Stated Pain Goal: 4 (80/22/33 6122)  Complications: No apparent anesthesia complications

## 2016-10-29 NOTE — Progress Notes (Signed)
X-ray results noted 

## 2016-10-29 NOTE — Anesthesia Preprocedure Evaluation (Signed)
Anesthesia Evaluation  Patient identified by MRN, date of birth, ID band Patient awake    Reviewed: Allergy & Precautions, NPO status , Patient's Chart, lab work & pertinent test results  Airway Mallampati: II  TM Distance: >3 FB Neck ROM: Full    Dental   Pulmonary    Pulmonary exam normal        Cardiovascular Normal cardiovascular exam     Neuro/Psych    GI/Hepatic   Endo/Other    Renal/GU      Musculoskeletal   Abdominal   Peds  Hematology   Anesthesia Other Findings   Reproductive/Obstetrics                             Anesthesia Physical Anesthesia Plan  ASA: III  Anesthesia Plan: General   Post-op Pain Management:    Induction: Intravenous  PONV Risk Score and Plan: 3 and Ondansetron, Dexamethasone, Propofol and Midazolam  Airway Management Planned: LMA  Additional Equipment:   Intra-op Plan:   Post-operative Plan: Extubation in OR  Informed Consent: I have reviewed the patients History and Physical, chart, labs and discussed the procedure including the risks, benefits and alternatives for the proposed anesthesia with the patient or authorized representative who has indicated his/her understanding and acceptance.     Plan Discussed with: CRNA and Surgeon  Anesthesia Plan Comments:         Anesthesia Quick Evaluation

## 2016-10-29 NOTE — Progress Notes (Signed)
Patient returned my call. Introduced myself and asked patient if she had any financial question or concerns. Patient states she had surgery and wanted to know if there was any assistance with getting her medications that the surgeon wrote for her. Advised patient she may apply for the one-time $1000 grant which will help with her out of pocket cost for medications written by our physicians as well as other personal household expenses and gas cards. Patient states she would like to apply. Asked patient if she could bring in proof of income when she comes for her next visit on 11/05/16. She states she can. Placed a note in for her to see me on that day and advised her that I will see her while she is here at some point. Patient verbalized understanding and thanked me for reaching out to her. Patient has my name and number for any additional financial questions or concerns.

## 2016-10-29 NOTE — Progress Notes (Signed)
Portable upright Chest X-ray done. 

## 2016-10-29 NOTE — Interval H&P Note (Signed)
History and Physical Interval Note:  10/29/2016 10:16 AM  Jasmine Hood  has presented today for surgery, with the diagnosis of Right breast cancer  The various methods of treatment have been discussed with the patient and family.  She was brought by her friend, [name].  [name] has gone to [facility] to see someone.  She also has a left chest mole that she wants removed.  After consideration of risks, benefits and other options for treatment, the patient has consented to  Procedure(s): INSERTION PORT-A-CATH (N/A) as a surgical intervention .  The patient's history has been reviewed, patient examined, no change in status, stable for surgery.  I have reviewed the patient's chart and labs.  Questions were answered to the patient's satisfaction.     Kalon Erhardt H

## 2016-10-29 NOTE — Anesthesia Postprocedure Evaluation (Signed)
Anesthesia Post Note  Patient: Jasmine Hood  Procedure(s) Performed: Procedure(s) (LRB): INSERTION PORT-A-CATH (N/A) MOLE REMOVAL LEFT CHEST     Patient location during evaluation: PACU Anesthesia Type: General Level of consciousness: awake and alert Pain management: pain level controlled Vital Signs Assessment: post-procedure vital signs reviewed and stable Respiratory status: spontaneous breathing, nonlabored ventilation, respiratory function stable and patient connected to nasal cannula oxygen Cardiovascular status: blood pressure returned to baseline and stable Postop Assessment: no signs of nausea or vomiting Anesthetic complications: no    Last Vitals:  Vitals:   10/29/16 1325 10/29/16 1355  BP: (!) 168/74 (!) 170/58  Pulse: 60 72  Resp: 16 18  Temp: 36.4 C 36.4 C    Last Pain:  Vitals:   10/29/16 1355  TempSrc: Oral  PainSc:                  Elnoria Livingston DAVID

## 2016-10-29 NOTE — Discharge Instructions (Signed)
[facility] [place] [facility] - DISCHARGE INSTRUCTIONS TO PATIENT  Activity:  Driving - May drive in 1 or 2 days, if doing well   Lifting - Activity as normal.  Wound Care:   Leave incisions dry for 2 days, then may shower.        No public water (lake, pool, ocean) for 3 weeks  Diet:  As tolerated  Follow up appointment:  Call Dr. Pollie Friar office [facility]) at [phone] for an appointment in 3 to 4 weeks.  Medications and dosages:  Resume your home medications.  You have a prescription for:  Vicodin  Call Dr. Lucia Gaskins or his office  [phone]) if you have:  Temperature greater than 100.4,  Redness, tenderness, or signs of infection (pain, swelling, redness, odor or green/yellow discharge around the site),  Difficulty breathing, headache or visual disturbances,  Any other questions or concerns you may have after discharge.  In an emergency, call 911 or go to an Emergency Department at a nearby hospital.   General Anesthesia, Adult, Care After These instructions provide you with information about caring for yourself after your procedure. Your health care provider may also give you more specific instructions. Your treatment has been planned according to current medical practices, but problems sometimes occur. Call your health care provider if you have any problems or questions after your procedure. What can I expect after the procedure? After the procedure, it is common to have:  Vomiting.  A sore throat.  Mental slowness.  It is common to feel:  Nauseous.  Cold or shivery.  Sleepy.  Tired.  Sore or achy, even in parts of your body where you did not have surgery.  Follow these instructions at home: For at least 24 hours after the procedure:  Do not: ? Participate in activities where you could fall or become injured. ? Drive. ? Use heavy machinery. ? Drink alcohol. ? Take sleeping pills or medicines that cause drowsiness. ? Make important  decisions or sign legal documents. ? Take care of children on your own.  Rest. Eating and drinking  If you vomit, drink water, juice, or soup when you can drink without vomiting.  Drink enough fluid to keep your urine clear or pale yellow.  Make sure you have little or no nausea before eating solid foods.  Follow the diet recommended by your health care provider. General instructions  Have a responsible adult stay with you until you are awake and alert.  Return to your normal activities as told by your health care provider. Ask your health care provider what activities are safe for you.  Take over-the-counter and prescription medicines only as told by your health care provider.  If you smoke, do not smoke without supervision.  Keep all follow-up visits as told by your health care provider. This is important. Contact a health care provider if:  You continue to have nausea or vomiting at home, and medicines are not helpful.  You cannot drink fluids or start eating again.  You cannot urinate after 8-12 hours.  You develop a skin rash.  You have fever.  You have increasing redness at the site of your procedure. Get help right away if:  You have difficulty breathing.  You have chest pain.  You have unexpected bleeding.  You feel that you are having a life-threatening or urgent problem. This information is not intended to replace advice given to you by your health care provider. Make sure you discuss any questions you have with your  health care provider. Document Released: 08/12/2000 Document Revised: 10/09/2015 Document Reviewed: 04/20/2015 Elsevier Interactive Patient Education  [employer].

## 2016-10-29 NOTE — Op Note (Signed)
10/29/2016  11:45 AM  PATIENT:  Jasmine Hood, 71 y.o., female MRN: [id number] DOB: 1946-02-24  PREOP DIAGNOSIS:  Right breast cancer, anticipate chemotherapy  POSTOP DIAGNOSIS:   Right breast cancer, anticipate chemotherapy  PROCEDURE:   Procedure(s): INSERTION PORT-A-CATH,  MOLE REMOVAL LEFT CHEST  SURGEON:   Alphonsa Overall, M.D.  ANESTHESIA:   general  Anesthesiologist: Lillia Abed, MD CRNA: Genelle Bal, CRNA; Glory Buff, CRNA  General  EBL:  minimal  ml  COUNTS CORRECT:  YES  INDICATIONS FOR PROCEDURE:  Jasmine Hood is a 70 y.o. (DOB: 1945/08/30) white female whose primary care physician is Marletta Lor, MD and comes for power port placement for the treatment of right breast cancer.  Dr. [name] is her treating oncologist.   I saw Ms. Vossler for an advanced right breast cancer with positive nodes.  She has seen Dr. [name] for neoajuvant chemotx.  She has seen Dr. Lisbeth Renshaw for radiation therapy.   The indications and risks of the surgery were explained to the patient.  The risks include, but are not limited to, infection, bleeding, pneumothorax, nerve injury, and thrombosis of the vein.  OPERATIVE NOTE:  The patient was taken to OR room #1 at [facility].  Anesthesia was provided by Anesthesiologist: Lillia Abed, MD CRNA: Genelle Bal, CRNA; Glory Buff, CRNA.  At the beginning of the operation, the patient was given 3 gm Ancef, had a roll placed under her back, and had the upper chest/neck prepped with Chloroprep and draped.  She is very obese and has limited neck movement, so we had to support her neck during the operation.   A time out was held and the surgery checklist reviewed.   The patient was placed in Trendelenburg position.  The left subclavian vein was accessed with a 16 gauge needle and a guide wire threaded through the needle into the vein.  The position of the wire was checked with fluoroscopy.   I then  developed a pocket in the upper inner aspect of the left chest for the port reservoir.  I used the [employer] for venous access.  The reservoir was sewn in place with a 3-0 Vicryl suture.  The reservoir had been flushed with dilute (10 units/cc) heparin.   I then passed the silastic tubing from the reservoir incision to the subclavian stick site and used the 8 French introducer to pass it into the vein.  The tip of the silastic catheter was position at the junction of the SVC and the right atrium under fluoroscopy.  The silastic catheter was then attached to the port with the bayonet device.     The entire port and tubing were checked with fluoroscopy and then the port was flushed with 4 cc of concentrated heparin (100 units/cc).   The wounds were then closed with 3-0 vicryl subcutaneous sutures and the skin closed with a 4-0 Monocryl suture.  I used 13 cc of 1/4% marcaine as a local anesthetic. The skin was painted with DermaBond.   The patient was transferred to the recovery room in good condition.  The sponge and needle count were correct at the end of the case.  A CXR is ordered for port placement and pending at the time of this note.  Alphonsa Overall, MD, [facility] Pager: [phone] Office phone:  [phone]

## 2016-10-29 NOTE — Progress Notes (Signed)
Labetalol 5mg IVP given as ordered. 

## 2016-10-29 NOTE — Progress Notes (Signed)
Dr. Conrad Melbourne notified of patient's blood pressures and heart rates- orders given.

## 2016-10-29 NOTE — Progress Notes (Signed)
Called patient to introduce myself as Arboriculturist and to discuss options that may be available to her such as the [employer]. Left a voicemail with my contact name and number.

## 2016-10-29 NOTE — Anesthesia Procedure Notes (Signed)
Procedure Name: LMA Insertion Date/Time: 10/29/2016 10:50 AM Performed by: Genelle Bal Pre-anesthesia Checklist: Patient identified, Emergency Drugs available, Suction available and Patient being monitored Patient Re-evaluated:Patient Re-evaluated prior to inductionOxygen Delivery Method: Circle system utilized Preoxygenation: Pre-oxygenation with 100% oxygen Intubation Type: IV induction Ventilation: Mask ventilation without difficulty LMA: LMA inserted LMA Size: 4.0 Number of attempts: 1 Placement Confirmation: positive ETCO2 and breath sounds checked- equal and bilateral Tube secured with: Tape Dental Injury: Teeth and Oropharynx as per pre-operative assessment

## 2016-10-30 ENCOUNTER — Other Ambulatory Visit: Payer: Self-pay | Admitting: Oncology

## 2016-10-30 ENCOUNTER — Encounter (HOSPITAL_COMMUNITY): Admission: RE | Admit: 2016-10-30 | Payer: Medicare HMO | Source: Ambulatory Visit

## 2016-10-30 ENCOUNTER — Encounter (HOSPITAL_COMMUNITY): Payer: Self-pay | Admitting: Surgery

## 2016-10-30 ENCOUNTER — Encounter (HOSPITAL_COMMUNITY)
Admission: RE | Admit: 2016-10-30 | Discharge: 2016-10-30 | Disposition: A | Discharge status: Home / Self Care | Payer: Medicare HMO | Source: Ambulatory Visit | Attending: Oncology | Admitting: Oncology

## 2016-10-30 ENCOUNTER — Ambulatory Visit (HOSPITAL_COMMUNITY)
Admission: RE | Admit: 2016-10-30 | Discharge: 2016-10-30 | Disposition: A | Discharge status: Home / Self Care | Payer: Medicare HMO | Source: Ambulatory Visit | Attending: Oncology | Admitting: Oncology

## 2016-10-30 DIAGNOSIS — C50811 Malignant neoplasm of overlapping sites of right female breast: Secondary | ICD-10-CM

## 2016-10-30 DIAGNOSIS — I700 Atherosclerosis of aorta: Secondary | ICD-10-CM | POA: Insufficient documentation

## 2016-10-30 DIAGNOSIS — Z170 Estrogen receptor positive status [ER+]: Secondary | ICD-10-CM | POA: Insufficient documentation

## 2016-10-30 DIAGNOSIS — R590 Localized enlarged lymph nodes: Secondary | ICD-10-CM | POA: Diagnosis not present

## 2016-10-30 DIAGNOSIS — C50919 Malignant neoplasm of unspecified site of unspecified female breast: Secondary | ICD-10-CM | POA: Diagnosis not present

## 2016-10-30 DIAGNOSIS — N6310 Unspecified lump in the right breast, unspecified quadrant: Secondary | ICD-10-CM | POA: Diagnosis not present

## 2016-10-30 MED ORDER — IOPAMIDOL (ISOVUE-300) INJECTION 61%
INTRAVENOUS | Status: AC
  Filled 2016-10-30: qty 75

## 2016-10-30 MED ORDER — IOPAMIDOL (ISOVUE-300) INJECTION 61%
75.0000 mL | Freq: Once | INTRAVENOUS | Status: AC | PRN
  Administered 2016-10-30: 75 mL via INTRAVENOUS

## 2016-10-30 MED ORDER — TECHNETIUM TC 99M MEDRONATE IV KIT
21.8000 | PACK | Freq: Once | INTRAVENOUS | Status: AC | PRN
  Administered 2016-10-30: 21.8 via INTRAVENOUS

## 2016-10-30 MED ORDER — HEPARIN SOD (PORK) LOCK FLUSH 100 UNIT/ML IV SOLN
500.0000 [IU] | Freq: Once | INTRAVENOUS | Status: AC
  Administered 2016-10-30: 500 [IU] via INTRAVENOUS

## 2016-10-30 MED ORDER — HEPARIN SOD (PORK) LOCK FLUSH 100 UNIT/ML IV SOLN
INTRAVENOUS | Status: AC
  Administered 2016-10-30: 500 [IU] via INTRAVENOUS
  Filled 2016-10-30: qty 5

## 2016-10-31 ENCOUNTER — Other Ambulatory Visit: Payer: Self-pay | Admitting: Oncology

## 2016-10-31 DIAGNOSIS — Z170 Estrogen receptor positive status [ER+]: Principal | ICD-10-CM

## 2016-10-31 DIAGNOSIS — C50811 Malignant neoplasm of overlapping sites of right female breast: Secondary | ICD-10-CM

## 2016-10-31 NOTE — Progress Notes (Unsigned)
I called [name] and gave her the results of her scans which are generally favorable but do raise a question regarding possible spinal involvement. I am setting her up for a total spine with metastases hopefully before her next visit here.

## 2016-11-04 ENCOUNTER — Ambulatory Visit
Admission: RE | Admit: 2016-11-04 | Discharge: 2016-11-04 | Disposition: A | Discharge status: Home / Self Care | Payer: Medicare HMO | Source: Ambulatory Visit | Attending: Surgery | Admitting: Surgery

## 2016-11-04 ENCOUNTER — Other Ambulatory Visit: Payer: Self-pay | Admitting: *Deleted

## 2016-11-04 DIAGNOSIS — Z170 Estrogen receptor positive status [ER+]: Principal | ICD-10-CM

## 2016-11-04 DIAGNOSIS — C50811 Malignant neoplasm of overlapping sites of right female breast: Secondary | ICD-10-CM

## 2016-11-04 DIAGNOSIS — N6320 Unspecified lump in the left breast, unspecified quadrant: Secondary | ICD-10-CM

## 2016-11-05 ENCOUNTER — Encounter: Payer: Self-pay | Admitting: *Deleted

## 2016-11-05 ENCOUNTER — Other Ambulatory Visit (HOSPITAL_BASED_OUTPATIENT_CLINIC_OR_DEPARTMENT_OTHER): Payer: Medicare HMO

## 2016-11-05 ENCOUNTER — Ambulatory Visit (HOSPITAL_BASED_OUTPATIENT_CLINIC_OR_DEPARTMENT_OTHER): Payer: Medicare HMO | Admitting: Oncology

## 2016-11-05 ENCOUNTER — Encounter: Payer: Self-pay | Admitting: Oncology

## 2016-11-05 ENCOUNTER — Ambulatory Visit (HOSPITAL_BASED_OUTPATIENT_CLINIC_OR_DEPARTMENT_OTHER): Payer: Medicare HMO

## 2016-11-05 ENCOUNTER — Telehealth: Payer: Self-pay | Admitting: Oncology

## 2016-11-05 VITALS — BP 180/70 | HR 77 | Temp 98.0°F | Resp 18 | Ht 59.5 in | Wt 247.5 lb

## 2016-11-05 DIAGNOSIS — C773 Secondary and unspecified malignant neoplasm of axilla and upper limb lymph nodes: Secondary | ICD-10-CM

## 2016-11-05 DIAGNOSIS — Z6841 Body Mass Index (BMI) 40.0 and over, adult: Secondary | ICD-10-CM

## 2016-11-05 DIAGNOSIS — C50511 Malignant neoplasm of lower-outer quadrant of right female breast: Secondary | ICD-10-CM

## 2016-11-05 DIAGNOSIS — C50411 Malignant neoplasm of upper-outer quadrant of right female breast: Secondary | ICD-10-CM

## 2016-11-05 DIAGNOSIS — Z170 Estrogen receptor positive status [ER+]: Principal | ICD-10-CM

## 2016-11-05 DIAGNOSIS — Z5111 Encounter for antineoplastic chemotherapy: Secondary | ICD-10-CM | POA: Diagnosis not present

## 2016-11-05 DIAGNOSIS — R7401 Elevation of levels of liver transaminase levels: Secondary | ICD-10-CM | POA: Insufficient documentation

## 2016-11-05 DIAGNOSIS — C50811 Malignant neoplasm of overlapping sites of right female breast: Secondary | ICD-10-CM

## 2016-11-05 DIAGNOSIS — K7460 Unspecified cirrhosis of liver: Secondary | ICD-10-CM | POA: Insufficient documentation

## 2016-11-05 DIAGNOSIS — K7469 Other cirrhosis of liver: Secondary | ICD-10-CM

## 2016-11-05 LAB — CBC WITH DIFFERENTIAL/PLATELET
BASO%: 0.7 % (ref 0.0–2.0)
BASOS ABS: 0 10*3/uL (ref 0.0–0.1)
EOS%: 2.5 % (ref 0.0–7.0)
Eosinophils Absolute: 0.2 10*3/uL (ref 0.0–0.5)
HEMATOCRIT: 40.8 % (ref 34.8–46.6)
HGB: 13.6 g/dL (ref 11.6–15.9)
LYMPH#: 2.5 10*3/uL (ref 0.9–3.3)
LYMPH%: 41.8 % (ref 14.0–49.7)
MCH: 33.2 pg (ref 25.1–34.0)
MCHC: 33.3 g/dL (ref 31.5–36.0)
MCV: 99.5 fL (ref 79.5–101.0)
MONO#: 0.5 10*3/uL (ref 0.1–0.9)
MONO%: 8.9 % (ref 0.0–14.0)
NEUT#: 2.8 10*3/uL (ref 1.5–6.5)
NEUT%: 46.1 % (ref 38.4–76.8)
Platelets: 150 10*3/uL (ref 145–400)
RBC: 4.1 10*6/uL (ref 3.70–5.45)
RDW: 12.4 % (ref 11.2–14.5)
WBC: 6.1 10*3/uL (ref 3.9–10.3)

## 2016-11-05 LAB — COMPREHENSIVE METABOLIC PANEL
ALT: 40 U/L (ref 0–55)
AST: 45 U/L — AB (ref 5–34)
Albumin: 3.3 g/dL — ABNORMAL LOW (ref 3.5–5.0)
Alkaline Phosphatase: 97 U/L (ref 40–150)
Anion Gap: 11 mEq/L (ref 3–11)
BUN: 8.3 mg/dL (ref 7.0–26.0)
CALCIUM: 9.1 mg/dL (ref 8.4–10.4)
CHLORIDE: 107 meq/L (ref 98–109)
CO2: 25 mEq/L (ref 22–29)
Creatinine: 0.7 mg/dL (ref 0.6–1.1)
EGFR: 85 mL/min/{1.73_m2} — AB (ref >90)
Glucose: 114 mg/dl (ref 70–140)
POTASSIUM: 3.8 meq/L (ref 3.5–5.1)
Sodium: 143 mEq/L (ref 136–145)
Total Bilirubin: 0.97 mg/dL (ref 0.20–1.20)
Total Protein: 7 g/dL (ref 6.4–8.3)

## 2016-11-05 MED ORDER — HEPARIN SOD (PORK) LOCK FLUSH 100 UNIT/ML IV SOLN
500.0000 [IU] | Freq: Once | INTRAVENOUS | Status: AC | PRN
  Administered 2016-11-05: 500 [IU]
  Filled 2016-11-05: qty 5

## 2016-11-05 MED ORDER — SODIUM CHLORIDE 0.9 % IV SOLN
600.0000 mg/m2 | Freq: Once | INTRAVENOUS | Status: AC
  Administered 2016-11-05: 1300 mg via INTRAVENOUS
  Filled 2016-11-05: qty 65

## 2016-11-05 MED ORDER — SODIUM CHLORIDE 0.9% FLUSH
10.0000 mL | INTRAVENOUS | Status: DC | PRN
  Administered 2016-11-05: 10 mL
  Filled 2016-11-05: qty 10

## 2016-11-05 MED ORDER — SODIUM CHLORIDE 0.9 % IV SOLN
Freq: Once | INTRAVENOUS | Status: AC
  Administered 2016-11-05: 10:00:00 via INTRAVENOUS

## 2016-11-05 MED ORDER — DEXAMETHASONE 4 MG PO TABS: 8.0000 mg | ORAL_TABLET | Freq: Every day | ORAL | 1 refills | Status: DC

## 2016-11-05 MED ORDER — METHOTREXATE SODIUM (PF) CHEMO INJECTION 250 MG/10ML
39.0000 mg/m2 | Freq: Once | INTRAMUSCULAR | Status: AC
  Administered 2016-11-05: 85 mg via INTRAVENOUS
  Filled 2016-11-05: qty 3.4

## 2016-11-05 MED ORDER — FLUOROURACIL CHEMO INJECTION 2.5 GM/50ML
600.0000 mg/m2 | Freq: Once | INTRAVENOUS | Status: AC
  Administered 2016-11-05: 1300 mg via INTRAVENOUS
  Filled 2016-11-05: qty 26

## 2016-11-05 MED ORDER — LIDOCAINE-PRILOCAINE 2.5-2.5 % EX CREA: 1.0000 "application " | TOPICAL_CREAM | CUTANEOUS | 0 refills | Status: DC | PRN

## 2016-11-05 MED ORDER — PROCHLORPERAZINE MALEATE 10 MG PO TABS: 10.0000 mg | ORAL_TABLET | Freq: Four times a day (QID) | ORAL | 1 refills | Status: DC | PRN

## 2016-11-05 MED ORDER — PALONOSETRON HCL INJECTION 0.25 MG/5ML
INTRAVENOUS | Status: AC
  Filled 2016-11-05: qty 5

## 2016-11-05 MED ORDER — DEXAMETHASONE SODIUM PHOSPHATE 10 MG/ML IJ SOLN
INTRAMUSCULAR | Status: AC
  Filled 2016-11-05: qty 1

## 2016-11-05 MED ORDER — DEXAMETHASONE SODIUM PHOSPHATE 10 MG/ML IJ SOLN
10.0000 mg | Freq: Once | INTRAMUSCULAR | Status: AC
  Administered 2016-11-05: 10 mg via INTRAVENOUS

## 2016-11-05 MED ORDER — PALONOSETRON HCL INJECTION 0.25 MG/5ML
0.2500 mg | Freq: Once | INTRAVENOUS | Status: AC
  Administered 2016-11-05: 0.25 mg via INTRAVENOUS

## 2016-11-05 NOTE — Progress Notes (Signed)
Met with patient to introduce myself as her Arboriculturist. Patient brought proof of her income. Patient approved for the one-time $1000 grant. Patient has a copy of the award letter and expense sheet along with the outpatient pharmacy information. Patient requested and received a gas card today from her grant. Explained to her how the grant works for other assistance. Patient verbalized understanding.  Patient asked about head coverings. Gave patient referral for the [facility] with the phone number on it.  Patient has my card for any additional financial questions or concerns. The RN came to get her to escort her to have blood drawn.

## 2016-11-05 NOTE — Telephone Encounter (Signed)
Patient was given a copy of the AVS report and appointment schedule, per 11/05/16 los.

## 2016-11-05 NOTE — Telephone Encounter (Signed)
Per Nicole/desk nurse, Flush scheduled for Access on 11/13/16. Access to be left in for procedure on 11/14/16 @ [facility] De-access port scheduled for 11/14/16, per Nicole/desk nurse.

## 2016-11-05 NOTE — Patient Instructions (Signed)
[facility] Discharge Instructions for Patients Receiving Chemotherapy  Today you received the following chemotherapy agents Cytoxan, Methotrexate, adrucil  To help prevent nausea and vomiting after your treatment, we encourage you to take your nausea medication as directed  If you develop nausea and vomiting that is not controlled by your nausea medication, call the clinic.   BELOW ARE SYMPTOMS THAT SHOULD BE REPORTED IMMEDIATELY:  *FEVER GREATER THAN 100.5 F  *CHILLS WITH OR WITHOUT FEVER  NAUSEA AND VOMITING THAT IS NOT CONTROLLED WITH YOUR NAUSEA MEDICATION  *UNUSUAL SHORTNESS OF BREATH  *UNUSUAL BRUISING OR BLEEDING  TENDERNESS IN MOUTH AND THROAT WITH OR WITHOUT PRESENCE OF ULCERS  *URINARY PROBLEMS  *BOWEL PROBLEMS  UNUSUAL RASH Items with * indicate a potential emergency and should be followed up as soon as possible.  Feel free to call the clinic you have any questions or concerns. The clinic phone number is [phone].  Please show the [place] at check-in to the Emergency Department and triage nurse.  Fluorouracil, 5-FU injection What is this medicine? FLUOROURACIL, 5-FU (flure oh YOOR a sil) is a chemotherapy drug. It slows the growth of cancer cells. This medicine is used to treat many types of cancer like breast cancer, colon or rectal cancer, pancreatic cancer, and stomach cancer. This medicine may be used for other purposes; ask your health care provider or pharmacist if you have questions. COMMON BRAND NAME(S): Adrucil What should I tell my health care provider before I take this medicine? They need to know if you have any of these conditions: -blood disorders -dihydropyrimidine dehydrogenase (DPD) deficiency -infection (especially a virus infection such as chickenpox, cold sores, or herpes) -kidney disease -liver disease -malnourished, poor nutrition -recent or ongoing radiation therapy -an unusual or allergic reaction to  fluorouracil, other chemotherapy, other medicines, foods, dyes, or preservatives -pregnant or trying to get pregnant -breast-feeding How should I use this medicine? This drug is given as an infusion or injection into a vein. It is administered in a hospital or clinic by a specially trained health care professional. Talk to your pediatrician regarding the use of this medicine in children. Special care may be needed. Overdosage: If you think you have taken too much of this medicine contact a poison control center or emergency room at once. NOTE: This medicine is only for you. Do not share this medicine with others. What if I miss a dose? It is important not to miss your dose. Call your doctor or health care professional if you are unable to keep an appointment. What may interact with this medicine? -allopurinol -cimetidine -dapsone -digoxin -hydroxyurea -leucovorin -levamisole -medicines for seizures like ethotoin, fosphenytoin, phenytoin -medicines to increase blood counts like filgrastim, pegfilgrastim, sargramostim -medicines that treat or prevent blood clots like warfarin, enoxaparin, and dalteparin -methotrexate -metronidazole -pyrimethamine -some other chemotherapy drugs like busulfan, cisplatin, estramustine, vinblastine -trimethoprim -trimetrexate -vaccines Talk to your doctor or health care professional before taking any of these medicines: -acetaminophen -aspirin -ibuprofen -ketoprofen -naproxen This list may not describe all possible interactions. Give your health care provider a list of all the medicines, herbs, non-prescription drugs, or dietary supplements you use. Also tell them if you smoke, drink alcohol, or use illegal drugs. Some items may interact with your medicine. What should I watch for while using this medicine? Visit your doctor for checks on your progress. This drug may make you feel generally unwell. This is not uncommon, as chemotherapy can affect  healthy cells as well as cancer  cells. Report any side effects. Continue your course of treatment even though you feel ill unless your doctor tells you to stop. In some cases, you may be given additional medicines to help with side effects. Follow all directions for their use. Call your doctor or health care professional for advice if you get a fever, chills or sore throat, or other symptoms of a cold or flu. Do not treat yourself. This drug decreases your body's ability to fight infections. Try to avoid being around people who are sick. This medicine may increase your risk to bruise or bleed. Call your doctor or health care professional if you notice any unusual bleeding. Be careful brushing and flossing your teeth or using a toothpick because you may get an infection or bleed more easily. If you have any dental work done, tell your dentist you are receiving this medicine. Avoid taking products that contain aspirin, acetaminophen, ibuprofen, naproxen, or ketoprofen unless instructed by your doctor. These medicines may hide a fever. Do not become pregnant while taking this medicine. Women should inform their doctor if they wish to become pregnant or think they might be pregnant. There is a potential for serious side effects to an unborn child. Talk to your health care professional or pharmacist for more information. Do not breast-feed an infant while taking this medicine. Men should inform their doctor if they wish to father a child. This medicine may lower sperm counts. Do not treat diarrhea with over the counter products. Contact your doctor if you have diarrhea that lasts more than 2 days or if it is severe and watery. This medicine can make you more sensitive to the sun. Keep out of the sun. If you cannot avoid being in the sun, wear protective clothing and use sunscreen. Do not use sun lamps or tanning beds/booths. What side effects may I notice from receiving this medicine? Side effects that you  should report to your doctor or health care professional as soon as possible: -allergic reactions like skin rash, itching or hives, swelling of the face, lips, or tongue -low blood counts - this medicine may decrease the number of white blood cells, red blood cells and platelets. You may be at increased risk for infections and bleeding. -signs of infection - fever or chills, cough, sore throat, pain or difficulty passing urine -signs of decreased platelets or bleeding - bruising, pinpoint red spots on the skin, black, tarry stools, blood in the urine -signs of decreased red blood cells - unusually weak or tired, fainting spells, lightheadedness -breathing problems -changes in vision -chest pain -mouth sores -nausea and vomiting -pain, swelling, redness at site where injected -pain, tingling, numbness in the hands or feet -redness, swelling, or sores on hands or feet -stomach pain -unusual bleeding Side effects that usually do not require medical attention (report to your doctor or health care professional if they continue or are bothersome): -changes in finger or toe nails -diarrhea -dry or itchy skin -hair loss -headache -loss of appetite -sensitivity of eyes to the light -stomach upset -unusually teary eyes This list may not describe all possible side effects. Call your doctor for medical advice about side effects. You may report side effects to FDA at 1-800-FDA-1088. Where should I keep my medicine? This drug is given in a hospital or clinic and will not be stored at home. NOTE: This sheet is a summary. It may not cover all possible information. If you have questions about this medicine, talk to your doctor, pharmacist, or health  care provider.  2018 [employer] Standard (2007-09-09 13:53:16) Methotrexate injection What is this medicine? METHOTREXATE (METH oh TREX ate) is a chemotherapy drug used to treat cancer including breast cancer, leukemia, and lymphoma. This medicine can  also be used to treat psoriasis and certain kinds of arthritis. This medicine may be used for other purposes; ask your health care provider or pharmacist if you have questions. What should I tell my health care provider before I take this medicine? They need to know if you have any of these conditions: -fluid in the stomach area or lungs -if you often drink alcohol -infection or immune system problems -kidney disease -liver disease -low blood counts, like low white cell, platelet, or red cell counts -lung disease -radiation therapy -stomach ulcers -ulcerative colitis -an unusual or allergic reaction to methotrexate, other medicines, foods, dyes, or preservatives -pregnant or trying to get pregnant -breast-feeding How should I use this medicine? This medicine is for infusion into a vein or for injection into muscle or into the spinal fluid (whichever applies). It is usually given by a health care professional in a hospital or clinic setting. In rare cases, you might get this medicine at home. You will be taught how to give this medicine. Use exactly as directed. Take your medicine at regular intervals. Do not take your medicine more often than directed. If this medicine is used for arthritis or psoriasis, it should be taken weekly, NOT daily. It is important that you put your used needles and syringes in a special sharps container. Do not put them in a trash can. If you do not have a sharps container, call your pharmacist or healthcare provider to get one. Talk to your pediatrician regarding the use of this medicine in children. While this drug may be prescribed for children as young as 2 years for selected conditions, precautions do apply. Overdosage: If you think you have taken too much of this medicine contact a poison control center or emergency room at once. NOTE: This medicine is only for you. Do not share this medicine with others. What if I miss a dose? It is important not to miss  your dose. Call your doctor or health care professional if you are unable to keep an appointment. If you give yourself the medicine and you miss a dose, talk with your doctor or health care professional. Do not take double or extra doses. What may interact with this medicine? This medicine may interact with the following medications: -acitretin -aspirin or aspirin-like medicines including salicylates -azathioprine -certain antibiotics like chloramphenicol, penicillin, tetracycline -certain medicines for stomach problems like esomeprazole, omeprazole, pantoprazole -cyclosporine -gold -hydroxychloroquine -live virus vaccines -mercaptopurine -NSAIDs, medicines for pain and inflammation, like ibuprofen or naproxen -other cytotoxic agents -penicillamine -phenylbutazone -phenytoin -probenacid -retinoids such as isotretinoin and tretinoin -steroid medicines like prednisone or cortisone -sulfonamides like sulfasalazine and trimethoprim/sulfamethoxazole -theophylline This list may not describe all possible interactions. Give your health care provider a list of all the medicines, herbs, non-prescription drugs, or dietary supplements you use. Also tell them if you smoke, drink alcohol, or use illegal drugs. Some items may interact with your medicine. What should I watch for while using this medicine? Avoid alcoholic drinks. In some cases, you may be given additional medicines to help with side effects. Follow all directions for their use. This medicine can make you more sensitive to the sun. Keep out of the sun. If you cannot avoid being in the sun, wear protective clothing and use sunscreen. Do not use  sun lamps or tanning beds/booths. You may get drowsy or dizzy. Do not drive, use machinery, or do anything that needs mental alertness until you know how this medicine affects you. Do not stand or sit up quickly, especially if you are an older patient. This reduces the risk of dizzy or fainting  spells. You may need blood work done while you are taking this medicine. Call your doctor or health care professional for advice if you get a fever, chills or sore throat, or other symptoms of a cold or flu. Do not treat yourself. This drug decreases your body's ability to fight infections. Try to avoid being around people who are sick. This medicine may increase your risk to bruise or bleed. Call your doctor or health care professional if you notice any unusual bleeding. Check with your doctor or health care professional if you get an attack of severe diarrhea, nausea and vomiting, or if you sweat a lot. The loss of too much body fluid can make it dangerous for you to take this medicine. Talk to your doctor about your risk of cancer. You may be more at risk for certain types of cancers if you take this medicine. Both men and women must use effective birth control with this medicine. Do not become pregnant while taking this medicine or until at least 1 normal menstrual cycle has occurred after stopping it. Women should inform their doctor if they wish to become pregnant or think they might be pregnant. Men should not father a child while taking this medicine and for 3 months after stopping it. There is a potential for serious side effects to an unborn child. Talk to your health care professional or pharmacist for more information. Do not breast-feed an infant while taking this medicine. What side effects may I notice from receiving this medicine? Side effects that you should report to your doctor or health care professional as soon as possible: -allergic reactions like skin rash, itching or hives, swelling of the face, lips, or tongue -back pain -breathing problems or shortness of breath -confusion -diarrhea -dry, nonproductive cough -low blood counts - this medicine may decrease the number of white blood cells, red blood cells and platelets. You may be at increased risk of infections and  bleeding -mouth sores -redness, blistering, peeling or loosening of the skin, including inside the mouth -seizures -severe headaches -signs of infection - fever or chills, cough, sore throat, pain or difficulty passing urine -signs and symptoms of bleeding such as bloody or black, tarry stools; red or dark-brown urine; spitting up blood or brown material that looks like coffee grounds; red spots on the skin; unusual bruising or bleeding from the eye, gums, or nose -signs and symptoms of kidney injury like trouble passing urine or change in the amount of urine -signs and symptoms of liver injury like dark yellow or brown urine; general ill feeling or flu-like symptoms; light-colored stools; loss of appetite; nausea; right upper belly pain; unusually weak or tired; yellowing of the eyes or skin -stiff neck -vomiting Side effects that usually do not require medical attention (report to your doctor or health care professional if they continue or are bothersome): -dizziness -hair loss -headache -stomach pain -upset stomach This list may not describe all possible side effects. Call your doctor for medical advice about side effects. You may report side effects to FDA at 1-800-FDA-1088. Where should I keep my medicine? If you are using this medicine at home, you will be instructed on how to  store this medicine. Throw away any unused medicine after the expiration date on the label. NOTE: This sheet is a summary. It may not cover all possible information. If you have questions about this medicine, talk to your doctor, pharmacist, or health care provider.  2018 [employer] Standard (2014-08-25 12:36:41) Cyclophosphamide injection What is this medicine? CYCLOPHOSPHAMIDE (sye kloe FOSS fa mide) is a chemotherapy drug. It slows the growth of cancer cells. This medicine is used to treat many types of cancer like lymphoma, myeloma, leukemia, breast cancer, and ovarian cancer, to name a few. This  medicine may be used for other purposes; ask your health care provider or pharmacist if you have questions. COMMON BRAND NAME(S): Cytoxan, Neosar What should I tell my health care provider before I take this medicine? They need to know if you have any of these conditions: -blood disorders -history of other chemotherapy -infection -kidney disease -liver disease -recent or ongoing radiation therapy -tumors in the bone marrow -an unusual or allergic reaction to cyclophosphamide, other chemotherapy, other medicines, foods, dyes, or preservatives -pregnant or trying to get pregnant -breast-feeding How should I use this medicine? This drug is usually given as an injection into a vein or muscle or by infusion into a vein. It is administered in a hospital or clinic by a specially trained health care professional. Talk to your pediatrician regarding the use of this medicine in children. Special care may be needed. Overdosage: If you think you have taken too much of this medicine contact a poison control center or emergency room at once. NOTE: This medicine is only for you. Do not share this medicine with others. What if I miss a dose? It is important not to miss your dose. Call your doctor or health care professional if you are unable to keep an appointment. What may interact with this medicine? This medicine may interact with the following medications: -amiodarone -amphotericin B -azathioprine -certain antiviral medicines for HIV or AIDS such as protease inhibitors (e.g., indinavir, ritonavir) and zidovudine -certain blood pressure medications such as benazepril, captopril, enalapril, fosinopril, lisinopril, moexipril, monopril, perindopril, quinapril, ramipril, trandolapril -certain cancer medications such as anthracyclines (e.g., daunorubicin, doxorubicin), busulfan, cytarabine, paclitaxel, pentostatin, tamoxifen, trastuzumab -certain diuretics such as chlorothiazide, chlorthalidone,  hydrochlorothiazide, indapamide, metolazone -certain medicines that treat or prevent blood clots like warfarin -certain muscle relaxants such as succinylcholine -cyclosporine -etanercept -indomethacin -medicines to increase blood counts like filgrastim, pegfilgrastim, sargramostim -medicines used as general anesthesia -metronidazole -natalizumab This list may not describe all possible interactions. Give your health care provider a list of all the medicines, herbs, non-prescription drugs, or dietary supplements you use. Also tell them if you smoke, drink alcohol, or use illegal drugs. Some items may interact with your medicine. What should I watch for while using this medicine? Visit your doctor for checks on your progress. This drug may make you feel generally unwell. This is not uncommon, as chemotherapy can affect healthy cells as well as cancer cells. Report any side effects. Continue your course of treatment even though you feel ill unless your doctor tells you to stop. Drink water or other fluids as directed. Urinate often, even at night. In some cases, you may be given additional medicines to help with side effects. Follow all directions for their use. Call your doctor or health care professional for advice if you get a fever, chills or sore throat, or other symptoms of a cold or flu. Do not treat yourself. This drug decreases your body's ability to fight infections. Try  to avoid being around people who are sick. This medicine may increase your risk to bruise or bleed. Call your doctor or health care professional if you notice any unusual bleeding. Be careful brushing and flossing your teeth or using a toothpick because you may get an infection or bleed more easily. If you have any dental work done, tell your dentist you are receiving this medicine. You may get drowsy or dizzy. Do not drive, use machinery, or do anything that needs mental alertness until you know how this medicine affects  you. Do not become pregnant while taking this medicine or for 1 year after stopping it. Women should inform their doctor if they wish to become pregnant or think they might be pregnant. Men should not father a child while taking this medicine and for 4 months after stopping it. There is a potential for serious side effects to an unborn child. Talk to your health care professional or pharmacist for more information. Do not breast-feed an infant while taking this medicine. This medicine may interfere with the ability to have a child. This medicine has caused ovarian failure in some women. This medicine has caused reduced sperm counts in some men. You should talk with your doctor or health care professional if you are concerned about your fertility. If you are going to have surgery, tell your doctor or health care professional that you have taken this medicine. What side effects may I notice from receiving this medicine? Side effects that you should report to your doctor or health care professional as soon as possible: -allergic reactions like skin rash, itching or hives, swelling of the face, lips, or tongue -low blood counts - this medicine may decrease the number of white blood cells, red blood cells and platelets. You may be at increased risk for infections and bleeding. -signs of infection - fever or chills, cough, sore throat, pain or difficulty passing urine -signs of decreased platelets or bleeding - bruising, pinpoint red spots on the skin, black, tarry stools, blood in the urine -signs of decreased red blood cells - unusually weak or tired, fainting spells, lightheadedness -breathing problems -dark urine -dizziness -palpitations -swelling of the ankles, feet, hands -trouble passing urine or change in the amount of urine -weight gain -yellowing of the eyes or skin Side effects that usually do not require medical attention (report to your doctor or health care professional if they continue or  are bothersome): -changes in nail or skin color -hair loss -missed menstrual periods -mouth sores -nausea, vomiting This list may not describe all possible side effects. Call your doctor for medical advice about side effects. You may report side effects to FDA at 1-800-FDA-1088. Where should I keep my medicine? This drug is given in a hospital or clinic and will not be stored at home. NOTE: This sheet is a summary. It may not cover all possible information. If you have questions about this medicine, talk to your doctor, pharmacist, or health care provider.  2018 [employer] Standard (2012-03-20 16:22:58)

## 2016-11-05 NOTE — Progress Notes (Signed)
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 When she noted some significant changes in her right nipple she saw [name] and was set up for bilateral diagnostic mammography with tomography and [facility]. This found the breast density to be category B. In the upper and lower outer quadrants of the right breast there was a mass measuring at least 10 cm. There were also groups of heterogeneous calcifications measuring 3 cm and a separate group 0.4 cm. On physical exam there was a palpable firm mass measuring approximately 10 cm involving the upper outer and lower outer quadrant of the right breast, with skin reaction and nipple retraction. There was no palpable right axillary adenopathy.  Right breast ultrasonography confirmed a large hypoechoic mass with irregular margins extending to the skin surface. The right axilla showed 2 lymph nodes which appeared abnormal.  In the left breast there were some indeterminate retroareolar calcifications which were felt to warrant biopsy. This was performed 10/08/2016 and showed a fibroadenoma ([id number] [id number]).  Biopsy of the right breast mass at the 7:00 and 10:00  positions as well as one of the suspicious lymph nodes all showed invasive ductal carcinoma, E-cadherin positive. Separate prognostic profiles from the 2 breast masses were sent. Estrogen receptor was positive at 95-70%, progesterone receptor was positive at 15-85%, all with strong staining intensity, the MIB-1 was 15-20%, and HER-2 was nonamplified, the signals ratio being 1.28-1.72 and the number per cell 1.85-3.95.  The patient's subsequent history is as detailed below  INTERVAL HISTORY: [name] returns today for follow-up and treatment of her estrogen receptor positive breast cancer. Today is day 1 cycle 1 of 8 planned cycles of CMF. The patient was driven here by her youngest daughter, but the patient insisted the daughter stay in the lobby so she did not participate in the visit  Since her last visit here [name] had baseline breast MRI on 10/21/2016. This showed extensive mass and non-masslike enhancement throughout the outer right breast involving several quadrants and at least 4 enlarged and morphologically abnormal right axillary lymph nodes, level I. And there was also a suspicious area of linear non-masslike enhancement in the left breast. She is scheduled for MRI biopsy of the suspicious areas on 11/14/2016.  On 10/29/2016 she had port placement. This went uneventfully. On 10/30/2016 she had a bone scan showing at least 3 places in the thoracic spine suspicious for metastatic disease (T5, T9, T12. CT scan of the chest showed the 4 cm right breast mass and enlarged right axillary lymph nodes, some borderline mediastinal lymph nodes, no pulmonary metastatic disease, no findings for upper abdominal metastatic disease. There was cirrhotic changes involving the liver.   REVIEW OF SYSTEMS: [name] has significant access problems and in fact her breast biopsy could not be performed because they could  not place an IV. She has brought some "pops" which are mostly sugar that she ordered from [place] that  are either time is being good for nausea. She is concerned that her youngest daughter, here today but not participating in the visit, has a lump in her breast that she has not brought to medical attention. Aside from these issues loss review of systems today was stable  PAST MEDICAL HISTORY: Past Medical History:  Diagnosis Date  . Breast cancer ([place]) 10/07/2016   bilateral breast   . [place] Shark recluse spider bite    right leg  . COLONIC POLYPS, HX OF 01/05/2009   Qualifier: Diagnosis of  By: [name]  MD, [name]   . Complication of anesthesia    slow to awaken after wisdom  teeth extraction age 46  . HYPOTHYROIDISM 02/01/2010   Qualifier: Diagnosis of  By: [name]  MD, [name]   . Morbid obesity ([place]) 01/05/2009   Qualifier: Diagnosis of  By: [name]  MD, [name]   . Pneumonia 1987  . Sleep apnea    no cpap used     PAST SURGICAL HISTORY: Past Surgical History:  Procedure Laterality Date  . BREAST BIOPSY Right 10/07/2016   invasive mammary carcinoma  . BREAST BIOPSY Left 10/08/2016   left  breast fibroadenoma no malignancy  . CATARACT EXTRACTION Bilateral   . COLONOSCOPY WITH PROPOFOL N/A 12/22/2015   Procedure: COLONOSCOPY WITH PROPOFOL;  Surgeon: [name], MD;  Location: [facility];  Service: Endoscopy;  Laterality: N/A;  . colonscopy  2012   with polyp removed  . MOLE REMOVAL  10/29/2016   Procedure: MOLE REMOVAL LEFT CHEST;  Surgeon: Alphonsa Overall, MD;  Location: [facility];  Service: General;;  . PORTACATH PLACEMENT N/A 10/29/2016   Procedure: INSERTION PORT-A-CATH;  Surgeon: Alphonsa Overall, MD;  Location: [facility];  Service: General;  Laterality: N/A;  . surgery for spider bite  1998  . TUBAL LIGATION    . WISDOM TOOTH EXTRACTION  age 56    FAMILY HISTORY Family History  Problem Relation Age of Onset  . Breast cancer Mother        deceased at 62  . Throat cancer Father        smoker  . Rheum arthritis Sister        3 sisters pos. for osteo and RA  .  Diabetes Maternal Aunt   . Cancer Maternal Grandfather        mouth cancer-chewed tobacco  . Breast cancer Cousin 64  . Breast cancer Daughter   The patient's father died at the age of 33 from laryngeal cancer in the setting of tobacco abuse. The patient's mother died at the age of 47 from metastatic breast cancer which had been diagnosed in her early 1s. The patient has 2 half-sisters and one half-brother. One of her sisters has "stomach" cancer. There is also a cousin on the maternal side with breast cancer diagnosed at age 69. No family member has been genetically tested yet  GYNECOLOGIC HISTORY:  No LMP recorded. Patient is postmenopausal. Menarche age 46, first live birth age 58, she is [place] P5. She is status post bilateral tubal ligation. She stopped having periods in 1985. She tells me she did not use hormone replacement. However a note from her bone density scan December 2001 states "this patient began hormone replacement therapy 3 months ago"  SOCIAL HISTORY:  She is originally from [place]. 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 She has survived 2 husband's period at home currently is just she and her daughter Caden Fatica. She tells me this daughter had mild brain damage at birth and is not working, and cannot drive because of a history of seizures. However she is the one she is planning to name is her healthcare power of attorney. The patient has 13 grandchildren and 4 great-grandchildren. She attends a [employer]: At the 10/21/2016 visit the patient was given the appropriate documents to complete and notarize at her discretion   HEALTH MAINTENANCE: Social History  Substance Use Topics  . Smoking status: Never Smoker  . Smokeless tobacco: Never Used  . Alcohol use No     Colonoscopy: 12/22/2015/N[facility]  PAP:  Bone density: December 2001 was normal   Allergies  Allergen Reactions  . Bee Venom Anaphylaxis    Current  Outpatient Prescriptions  Medication Sig Dispense Refill  . acetaminophen (TYLENOL) 500 MG tablet Take 500 mg by mouth daily as needed for moderate pain or headache.    . dexamethasone (DECADRON) 4 MG tablet Take 2 tablets (8 mg total) by mouth daily. Start the day after chemotherapy for 2 days. Take with food. 30 tablet 1  . EPINEPHrine (EPIPEN 2-PAK) 0.3 mg/0.3 mL SOAJ injection Inject 0.3 mLs (0.3 mg total) into the muscle once. 1 Device 2  . HYDROcodone-acetaminophen (NORCO/VICODIN) 5-325 MG tablet Take 1-2 tablets by mouth every 6 (six) hours as needed for moderate pain. 20 tablet 0  . lidocaine-prilocaine (EMLA) cream Apply 1 application topically as needed. 30 g 0  . prochlorperazine (COMPAZINE) 10 MG tablet Take 1 tablet (10 mg total) by mouth every 6 (six) hours as needed (Nausea or vomiting). 30 tablet 1   No current facility-administered medications for this visit.     OBJECTIVE: Morbidly obese white woman iWho appears stated age  27:   11/05/16 0808  BP: (!) 180/70  Pulse: 77  Resp: 18  Temp: 98 F (36.7 C)     Body mass index is 49.15 kg/m.    ECOG FS:2 - Symptomatic, <50% confined to bed  Sclerae unicteric, EOMs intact Oropharynx clear and moist No cervical or supraclavicular adenopathy Lungs no rales or rhonchi Heart regular rate and rhythm Abd soft, nontender, positive bowel sounds MSK no focal spinal tenderness, no upper extremity lymphedema Neuro: nonfocal, well oriented, appropriate affect Breasts: Deferred see photo below for baseline Skin: The patient's port is intact in the upper anterior left chest wall  Photo right breast 10/21/2016    LAB RESULTS:  CMP     Component Value Date/Time   NA 140 10/21/2016 1450   K 4.1 10/21/2016 1450   CL 107 04/28/2015 1133   CO2 27 10/21/2016 1450   GLUCOSE 160 (H) 10/21/2016 1450   BUN 10.5 10/21/2016 1450   CREATININE 0.8 10/21/2016 1450   CALCIUM 9.4 10/21/2016 1450   PROT 7.3 10/21/2016 1450    ALBUMIN 3.4 (L) 10/21/2016 1450   AST 50 (H) 10/21/2016 1450   ALT 43 10/21/2016 1450   ALKPHOS 95 10/21/2016 1450   BILITOT 0.64 10/21/2016 1450   GFRNONAA 102.34 01/25/2010 0858   GFRAA  11/24/2008 1315    >60        The eGFR has been calculated using the MDRD equation. This calculation has not been validated in all clinical situations. eGFR's persistently <60 mL/min signify possible Chronic Kidney Disease.    No results found for: TOTALPROTELP, ALBUMINELP, A1GS, A2GS,  BETS, BETA2SER, GAMS, MSPIKE, SPEI  No results found for: Nils Pyle, [facility]  Lab Results  Component Value Date   WBC 5.6 10/21/2016   NEUTROABS 2.7 10/21/2016   HGB 14.1 10/21/2016   HCT 41.7 10/21/2016   MCV 99.1 10/21/2016   PLT 186 10/21/2016      Chemistry      Component Value Date/Time   NA 140 10/21/2016 1450   K 4.1 10/21/2016 1450   CL 107 04/28/2015 1133   CO2 27 10/21/2016 1450   BUN 10.5 10/21/2016 1450   CREATININE 0.8 10/21/2016 1450      Component Value Date/Time   CALCIUM 9.4 10/21/2016 1450   ALKPHOS 95 10/21/2016 1450   AST 50 (H) 10/21/2016 1450   ALT 43 10/21/2016 1450   BILITOT 0.64 10/21/2016 1450       No results found for: LABCA2  No components found for: [id number]  No results for input(s): INR in the last 168 hours.  Urinalysis    Component Value Date/Time   COLORURINE LT. YELLOW 01/25/2010 0858   APPEARANCEUR CLEAR 01/25/2010 0858   LABSPEC >=1.030 01/25/2010 0858   PHURINE 5.5 01/25/2010 0858   GLUCOSEU NEGATIVE 01/25/2010 0858   HGBUR 1+ 12/30/2008 0838   BILIRUBINUR Neg 04/28/2015 1122   KETONESUR 15 (?) 01/25/2010 0858   PROTEINUR Neg 04/28/2015 1122   PROTEINUR NEGATIVE 11/24/2008 1545   UROBILINOGEN 0.2 04/28/2015 1122   UROBILINOGEN 0.2 01/25/2010 0858   NITRITE Neg 04/28/2015 1122   NITRITE NEGATIVE 01/25/2010 0858   LEUKOCYTESUR small (1+) (A) 04/28/2015 1122     STUDIES: Ct Chest W Contrast  Result Date:  10/30/2016 CLINICAL DATA:  History of right breast cancer initially diagnosed in April 2018. Pre chemotherapy. EXAM: CT CHEST WITH CONTRAST TECHNIQUE: Multidetector CT imaging of the chest was performed during intravenous contrast administration. CONTRAST:  66m ISOVUE-300 IOPAMIDOL (ISOVUE-300) INJECTION 61% COMPARISON:  None. FINDINGS: Cardiovascular: The heart is normal in size. No pericardial effusion. Normal caliber thoracic aorta. Minimal scattered atherosclerotic calcifications. No dissection. The branch vessels are patent. Minimal scattered coronary artery calcifications. Mediastinum/Nodes: Scattered mediastinal lymph nodes are indeterminate. 9 mm superior mediastinal node on image number 39 near the SVC. 8 mm precarinal lymph node on image number 51. 9.5 mm aorticopulmonary window lymph node on image number 51. 10.5 mm subcarinal lymph node on image number 64. The esophagus is grossly normal. Lungs/Pleura: No acute pulmonary findings. No pulmonary nodules to suggest pulmonary metastatic disease. Minimal dependent bibasilar atelectasis. No pleural effusion. Upper Abdomen: No significant upper abdominal findings. Suspect mild cirrhotic changes involving the liver. Chest wall/ Musculoskeletal: 4 cm irregular mass noted in the right breast consistent with patient's known breast cancer. There are enlarged right axillary lymph nodes consistent with metastatic disease. The largest node measures 13.5 mm short axis on image number 40. There is also a 10.5 mm lymph node on image number 48. No supraclavicular adenopathy. A left-sided Port-A-Cath is noted. No left-sided breast lesions or left axillary adenopathy. Mixed lytic and sclerotic appearance of the T6 vertebral body is very suspicious for a metastatic lesion. No other obvious lesions but recommend correlation with ordered nuclear medicine whole body bone scan. IMPRESSION: 1. 4 cm right breast mass and enlarged right axillary lymph nodes consistent with  metastatic adenopathy. 2. Borderline mediastinal lymph nodes. No findings for pulmonary metastatic disease. 3. Worrisome T6 bone lesion suspicious for metastasis. Recommend correlation with ordered bone scan. 4. No CT findings for upper abdominal metastatic disease. 5. Suspect cirrhotic  changes involving the liver. Aortic Atherosclerosis (ICD10-I70.0). Electronically Signed   By: Marijo Sanes M.D.   On: 10/30/2016 13:50   Nm Bone Scan Whole Body  Result Date: 10/30/2016 CLINICAL DATA:  History breast malignancy. No skeletal symptoms or history of trauma. EXAM: NUCLEAR MEDICINE WHOLE BODY BONE SCAN TECHNIQUE: Whole body anterior and posterior images were obtained approximately 3 hours after intravenous injection of radiopharmaceutical. RADIOPHARMACEUTICALS:  21.8 mCi Technetium-42mMDP IV COMPARISON:  No previous bone scans in PACs FINDINGS: There is adequate uptake of the radiopharmaceutical by the skeleton. There is adequate soft tissue clearance and renal activity. Uptake within the calvarium is normal. There is increased uptake in the upper and mid and lower thoracic spine as well as at L5-S1. No definite abnormal rib uptake is observed. Uptake in the pelvis appears normal. No significant upper or lower extremity uptake is observed. Uptake in the posterior aspect of the right calcaneus is likely degenerative in nature. IMPRESSION: Increased uptake within the thoracic spine which may reflect metastatic disease. The upper thoracic focus may correspond to the sclerotic region of T5 on today's CT scan. The increased uptake at approximately T9 and T12 does not clearly correspond to CT abnormalities but is worrisome for early metastatic disease as well. Electronically Signed   By: David  JMartiniqueM.D.   On: 10/30/2016 15:02   Mr Breast Bilateral W Wo Contrast  Result Date: 10/21/2016 CLINICAL DATA:  Malignant neoplasm of right breast, stage II. Family history of breast cancer (mother and daughter). Patient had 3  ultrasound-guided biopsies on 10/07/2016 (site 1: Right breast, lower outer quadrant, 7 o'clock position - grade 2 invasive mammary carcinoma. site 2: Right breast, upper outer quadrant, 10 o'clock position - grade 2 invasive mammary carcinoma. site 3: Right axillary lymph node - positive for metastatic disease) LABS:  GFR 91, BUN 11, creatinine 0.6 EXAM: BILATERAL BREAST MRI WITH AND WITHOUT CONTRAST TECHNIQUE: Multiplanar, multisequence MR images of both breasts were obtained prior to and following the intravenous administration of 20 ml of MultiHance. THREE-DIMENSIONAL MR IMAGE RENDERING ON INDEPENDENT WORKSTATION: Three-dimensional MR images were rendered by post-processing of the original MR data on an independent workstation. The three-dimensional MR images were interpreted, and findings are reported in the following complete MRI report for this study. Three dimensional images were evaluated at the independent DynaCad workstation COMPARISON:  Previous exams including diagnostic mammogram and ultrasound dated 10/03/2016, ultrasound-guided biopsies of 10/07/2016 and stereotactic biopsy of 10/08/2016. FINDINGS: Breast composition: b. Scattered fibroglandular tissue. Background parenchymal enhancement: Mild Right breast: Extensive contiguous mass and non-mass enhancement throughout the outer right breast, upper outer quadrant and lower outer quadrant, greater than 10 cm anterior extent from posterior depth to anterior skin surface (with associated skin retraction), measuring approximately 4 cm greatest thickness and approximately 7 cm craniocaudal dimension, with mixed enhancement kinetics including washout. There are 2 sites of biopsy clip artifact at posterior depth, upper-outer quadrant and lower outer quadrant respectively, corresponding to the 2 sites of biopsy-proven carcinoma. The additional extensive mass and non-mass enhancement, extending from posterior depth to the anterior skin surface, is consistent  with associated extensive multifocal and multicentric disease. Left breast: Linear clumped non-mass enhancement, including suspicious clustered ring enhancement at its posterior aspect, measuring 4.4 cm extent, with mixed enhancement kinetics including some washout (series 9, images 154-162). Post biopsy change within the posterior left breast corresponding to the site of recent benign breast biopsy (stereotactic biopsy for calcifications revealing fibroadenoma). Lymph nodes: 4 enlarged/morphologically abnormal level 1 lymph  nodes within the right axilla, 1 of which with associated biopsy clip artifact corresponding to biopsy-proven metastatic disease. No enlarged or morphologically abnormal lymph nodes within the left axilla or bilateral internal mammary chain regions. Ancillary findings:  None. IMPRESSION: 1. Linear clumped non-mass enhancement (including highly suspicious clustered ring enhancement at its posterior aspect) within the upper LEFT breast, at ANTERIOR depth, measuring 4.4 cm extent. This is a suspicious finding for contralateral disease and MRI-guided biopsy is recommended. 2. Extensive contiguous mass and non-mass enhancement throughout the outer right breast, involving the upper-outer quadrant and lower outer quadrants, measuring greater than 10 cm anterior extend from posterior depth to anterior skin surface (with associated skin retraction), including 2 sites of biopsy-proven carcinoma at posterior depth with associated biopsy clip artifacts. The additional contiguous mass and non-mass enhancement throughout the outer right breast is consistent with extensive multifocal and multicentric disease. 3. At least 4 enlarged/morphologically abnormal level 1 lymph nodes within the right axilla. 4. Post biopsy change within the left breast at POSTERIOR depth, corresponding to the site of benign stereotactic biopsy for calcifications with pathology result of fibroadenoma. RECOMMENDATION: MRI-guided biopsy  for the suspicious linear clumped non-mass enhancement (including suspicious clustered ring enhancement posteriorly) within the upper LEFT breast, at anterior depth, with preferential sampling of the clustered ring enhancement if possible. BI-RADS CATEGORY  4: Suspicious. Electronically Signed   By: Franki Cabot M.D.   On: 10/21/2016 11:35   Dg Chest Port 1 View  Result Date: 10/29/2016 CLINICAL DATA:  Post-op Port-a-cath placement. EXAM: PORTABLE CHEST 1 VIEW COMPARISON:  None. FINDINGS: Left anterior chest wall Port-A-Cath extends through the left subclavian vein. Tip projects in the lower superior vena cava just above the caval atrial junction. No pneumothorax. Cardiac silhouette is normal in size. No mediastinal or hilar masses or evidence of adenopathy. Clear lungs. IMPRESSION: Left anterior chest wall Port-A-Cath is well positioned, tip lying in the lower superior vena cava just above the caval atrial junction. No pneumothorax. No acute cardiopulmonary disease. Electronically Signed   By: Lajean Manes M.D.   On: 10/29/2016 13:01   Dg C-arm 1-60 Min-no Report  Result Date: 10/29/2016 Fluoroscopy was utilized by the requesting physician.  No radiographic interpretation.   [place] Axillary Node Core Biopsy Right  Addendum Date: 10/08/2016   ADDENDUM REPORT: 10/08/2016 10:24 ADDENDUM: Pathology results for 3 ultrasound-guided biopsies performed 10/07/2016 are available. 1. Pathology result for a right breast biopsy in the lower outer quadrant 7 o'clock position shows grade 2 invasive mammary carcinoma. This is concordant with imaging findings. 2. Pathology results for a right breast biopsy in the upper outer quadrant 10 o'clock position shows grade 2 invasive mammary carcinoma. This is concordant with imaging findings. 3. Pathology results of a right axillary lymph node is positive for metastatic disease. Pathology results are concordant with imaging findings. I discussed pathology results with the  patient in person today. She has had some soreness at the biopsy sites, but is otherwise doing well. She is seeing Dr. Lucia Gaskins in clinic for surgical consultation tomorrow, 10/09/2016. In discussion with Dr. Lucia Gaskins by telephone today, we will not proceed with a stereotactic biopsy of a small group of calcifications in the medial right breast today, given her biopsy proven malignancy in 2 quadrants of the right breast. Electronically Signed   By: Curlene Dolphin M.D.   On: 10/08/2016 10:24   Result Date: 10/08/2016 CLINICAL DATA:  The patient presents for ultrasound-guided biopsy of right axillary lymph node. EXAM: ULTRASOUND GUIDED  CORE NEEDLE BIOPSY OF A RIGHT AXILLARY NODE COMPARISON:  Previous exam(s). FINDINGS: I met with the patient and we discussed the procedure of ultrasound-guided biopsy, including benefits and alternatives. We discussed the high likelihood of a successful procedure. We discussed the risks of the procedure, including infection, bleeding, tissue injury, clip migration, and inadequate sampling. Informed written consent was given. The usual time-out protocol was performed immediately prior to the procedure. Using sterile technique and 1% Lidocaine as local anesthetic, under direct ultrasound visualization, a 14 gauge vacuum-assisted device was used to perform biopsy of enlarged right axillary lymph node using a lateral approach. At the conclusion of the procedure a spiral shaped tissue marker clip was deployed into the biopsy cavity. Follow up 2 view mammogram was performed and dictated separately. IMPRESSION: Ultrasound guided biopsy of enlarged right axillary lymph node. No apparent complications. Electronically Signed: By: Nolon Nations M.D. On: 10/07/2016 09:40   Mm Clip Placement Left  Result Date: 10/08/2016 CLINICAL DATA:  Stereotactic biopsy was performed of calcifications in the upper central left breast, posterior third. EXAM: EXAM DIAGNOSTIC LEFT MAMMOGRAM POST STEREOTACTIC  BIOPSY COMPARISON:  Previous exam(s). FINDINGS: Mammographic images were obtained following stereotactic guided biopsy of left breast calcifications. A coil shaped biopsy clip is satisfactorily positioned at the biopsy site in the posterior third of the upper central left breast. There are a few remaining calcifications at the biopsy site. IMPRESSION: Satisfactory position of coil shaped biopsy clip. Final Assessment: Post Procedure Mammograms for Marker Placement Electronically Signed   By: Curlene Dolphin M.D.   On: 10/08/2016 09:55   Mm Clip Placement Right  Result Date: 10/07/2016 CLINICAL DATA:  Status post ultrasound-guided core biopsy 2 sites in the right breast and right axillary lymph node. EXAM: DIAGNOSTIC RIGHT MAMMOGRAM POST ULTRASOUND BIOPSY x3 COMPARISON:  Previous exam(s). FINDINGS: Mammographic images were obtained following ultrasound guided biopsy of 2 sites in the right breast, in the 7 o'clock and 10 o'clock location. Ribbon shaped clip in the 7 o'clock location right breast projects in the retroareolar region. Coil shaped clip in the 10 o'clock location of the right breast projects in the upper outer quadrant. The 2 clips within the breast are approximately 3.2 cm apart. A spiral shaped clip in the right axilla is visualized on a spot view of the axilla. IMPRESSION: Tissue marker clips are in the expected locations after biopsy. Final Assessment: Post Procedure Mammograms for Marker Placement Electronically Signed   By: Nolon Nations M.D.   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 This was found to be concordant by Dr. Curlene Dolphin. Pathology results were discussed with the patient by telephone. The patient reported doing well after the biopsy with tenderness at the  site. Post biopsy instructions and care were reviewed and questions were answered. The patient was encouraged to call [facility] for any additional concerns. The patient has a recent diagnosis of right breast cancer and should follow her outlined treatment plan. Pathology results reported by Terie Purser, RN on 10/09/2016. Electronically Signed   By: Curlene Dolphin M.D.   On: 10/09/2016 14:33   Result Date: 10/10/2016 CLINICAL DATA:  Stereotactic biopsy was suggested for calcifications in the upper central left breast, far posterior depth. Please note that the patient was also  scheduled for biopsy of calcifications in the medial right breast. Pathology results of the 2 right breast biopsies, including the upper-outer quadrant and upper inner quadrant and of a right axillary lymph node were received today, demonstrating malignancy at all sites. I telephoned Dr. Lucia Gaskins, and it was decided not to proceed with a right breast stereotactic biopsy today. EXAM: LEFT BREAST STEREOTACTIC CORE NEEDLE BIOPSY COMPARISON:  Previous exams. FINDINGS: The patient and I discussed the procedure of stereotactic-guided biopsy including benefits and alternatives. We discussed the high likelihood of a successful procedure. We discussed the risks of the procedure including infection, bleeding, tissue injury, clip migration, and inadequate sampling. Informed written consent was given. The usual time out protocol was performed immediately prior to the procedure. Using sterile technique and 1% Lidocaine as local anesthetic, under stereotactic guidance, a 9 gauge vacuum assisted device was used to perform core needle biopsy of calcifications in the upper inner quadrant of the left breast using a superior to inferior approach. Specimen radiograph was performed showing multiple calcifications. Specimens with calcifications are identified for pathology. Lesion quadrant: Upper inner quadrant At the conclusion of the  procedure, a coil shaped tissue marker clip was deployed into the biopsy cavity. Follow-up 2-view mammogram was performed and dictated separately. IMPRESSION: Stereotactic-guided biopsy of the left breast. No apparent complications. Electronically Signed: By: Curlene Dolphin M.D. On: 10/08/2016 10:18   [place] Rt Breast Bx W Loc Dev 1st Lesion Img Bx Spec US Guide  Addendum Date: 10/08/2016   ADDENDUM REPORT: 10/08/2016 10:24 ADDENDUM: Pathology results for 3 ultrasound-guided biopsies performed 10/07/2016 are available. 1. Pathology result for a right breast biopsy in the lower outer quadrant 7 o'clock position shows grade 2 invasive mammary carcinoma. This is concordant with imaging findings. 2. Pathology results for a right breast biopsy in the upper outer quadrant 10 o'clock position shows grade 2 invasive mammary carcinoma. This is concordant with imaging findings. 3. Pathology results of a right axillary lymph node is positive for metastatic disease. Pathology results are concordant with imaging findings. I discussed pathology results with the patient in person today. She has had some soreness at the biopsy sites, but is otherwise doing well. She is seeing Dr. Lucia Gaskins in clinic for surgical consultation tomorrow, 10/09/2016. In discussion with Dr. Lucia Gaskins by telephone today, we will not proceed with a stereotactic biopsy of a small group of calcifications in the medial right breast today, given her biopsy proven malignancy in 2 quadrants of the right breast. Electronically Signed   By: Curlene Dolphin M.D.   On: 10/08/2016 10:24   Result Date: 10/08/2016 CLINICAL DATA:  Patient presents for ultrasound-guided core biopsy of right breast lesions. EXAM: ULTRASOUND GUIDED RIGHT BREAST CORE NEEDLE BIOPSY COMPARISON:  Previous exam(s). FINDINGS: I met with the patient and we discussed the procedure of ultrasound-guided biopsy, including benefits and alternatives. We discussed the high likelihood of a successful  procedure. We discussed the risks of the procedure, including infection, bleeding, tissue injury, clip migration, and inadequate sampling. Informed written consent was given. The usual time-out protocol was performed immediately prior to the procedure. Lesion quadrant: Lower outer quadrant of the right breast Using sterile technique and 1% Lidocaine as local anesthetic, under direct ultrasound visualization, a 12 gauge spring-loaded device was used to perform biopsy of mass in the 7 o'clock location of the right breast using a lateral approach. At the conclusion of the procedure a ribbon shaped tissue marker clip was deployed into the biopsy cavity. Follow up 2 view  mammogram was performed and dictated separately. IMPRESSION: Ultrasound guided biopsy of right breast mass. No apparent complications. Electronically Signed: By: Nolon Nations M.D. On: 10/07/2016 09:36   [place] Rt Breast Bx W Loc Dev Ea Add Lesion Img Bx Spec US Guide  Addendum Date: 10/08/2016   ADDENDUM REPORT: 10/08/2016 10:24 ADDENDUM: Pathology results for 3 ultrasound-guided biopsies performed 10/07/2016 are available. 1. Pathology result for a right breast biopsy in the lower outer quadrant 7 o'clock position shows grade 2 invasive mammary carcinoma. This is concordant with imaging findings. 2. Pathology results for a right breast biopsy in the upper outer quadrant 10 o'clock position shows grade 2 invasive mammary carcinoma. This is concordant with imaging findings. 3. Pathology results of a right axillary lymph node is positive for metastatic disease. Pathology results are concordant with imaging findings. I discussed pathology results with the patient in person today. She has had some soreness at the biopsy sites, but is otherwise doing well. She is seeing Dr. Lucia Gaskins in clinic for surgical consultation tomorrow, 10/09/2016. In discussion with Dr. Lucia Gaskins by telephone today, we will not proceed with a stereotactic biopsy of a small group of  calcifications in the medial right breast today, given her biopsy proven malignancy in 2 quadrants of the right breast. Electronically Signed   By: Curlene Dolphin M.D.   On: 10/08/2016 10:24   Result Date: 10/08/2016 CLINICAL DATA:  The patient presents for ultrasound-guided core biopsy of right breast masses. EXAM: ULTRASOUND GUIDED RIGHT BREAST CORE NEEDLE BIOPSY COMPARISON:  Previous exam(s). FINDINGS: I met with the patient and we discussed the procedure of ultrasound-guided biopsy, including benefits and alternatives. We discussed the high likelihood of a successful procedure. We discussed the risks of the procedure, including infection, bleeding, tissue injury, clip migration, and inadequate sampling. Informed written consent was given. The usual time-out protocol was performed immediately prior to the procedure. Lesion quadrant: Upper-outer quadrant of the right breast Using sterile technique and 1% Lidocaine as local anesthetic, under direct ultrasound visualization, a 12 gauge spring-loaded device was used to perform biopsy of mass the 10 o'clock location of the right breast using a lateral approach. At the conclusion of the procedure a coil shaped tissue marker clip was deployed into the biopsy cavity. Follow up 2 view mammogram was performed and dictated separately. IMPRESSION: Ultrasound guided biopsy of right breast mass. No apparent complications. Electronically Signed: By: Nolon Nations M.D. On: 10/07/2016 09:37    ELIGIBLE FOR AVAILABLE RESEARCH PROTOCOL: no  ASSESSMENT: 71 y.o. 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 The good news is we don't see cancer in her lungs or liver. 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  However there are  several spots on her spine that are probably metastatic disease. I have set her up for MRI of the spine, yet to be scheduled, and she tells me she has no problems lying flat and lying still. That should give [place] more information. If those do proved to be metastatic, then of course we are dealing with stage IV disease which would not be curable, but generally patients who have bone only metastases do much better than those who have visceral disease and the treatment would be exactly the same as we are doing now except we would add denosumab  Today we reviewed the possible toxicities side effects and complications of CMF. I wrote her for EMLA cream, prochlorperazine and dexamethasone and I gave her written instructions on how to take those medications. Those instructions also will be in her bottles.  She has brought some popsicles which supposedly settled his stomach. The contents are primarily sugar. I let her know I do not anticipate those being useful.  She has very poor access. Her port needs to be accessed for all lab draws and all radiologic tests. She would come here to get it done since radiology frequently does not have nurses qualified to access the port. Similarly all lab work here will be done through a flush.  I have asked her to keep a diary of  symptoms. She will return to see [place] in one week then 2 weeks from now just for labs and then 3 weeks from now for her second cycle. The plan will be to get her through 8 cycles of CMF after which she hopefully will be able to undergo surgery  I have encouraged her to call with any questions or concerns that may develop before her next visit. Chauncey Cruel, MD   11/05/2016 8:40 AM Medical Oncology and Hematology [facility] [address] [place], [place] [address] Tel. [phone]    Fax. [phone]

## 2016-11-06 ENCOUNTER — Encounter: Payer: Self-pay | Admitting: Oncology

## 2016-11-06 NOTE — Progress Notes (Signed)
Received PA request for Lidocaine-Prilocaine cream from [facility].  Called [employer](Jalisa) to initiate PA. She asked some clinical questions and added pharmacist(Nick) in on the call whom also asked clinical questions.  Merrilee Seashore approved PA through 02/06/17 with a back date of 05/18/16. Approval will also be sent via fax.  Called and left Val a message per her request.  Called [facility](Aaron) to advise of approval. He states it went through and they will get it ready.

## 2016-11-11 ENCOUNTER — Other Ambulatory Visit: Payer: Self-pay | Admitting: Oncology

## 2016-11-11 ENCOUNTER — Ambulatory Visit (HOSPITAL_COMMUNITY)
Admission: RE | Admit: 2016-11-11 | Discharge: 2016-11-11 | Disposition: A | Discharge status: Home / Self Care | Payer: Medicare HMO | Source: Ambulatory Visit | Attending: Oncology | Admitting: Oncology

## 2016-11-11 DIAGNOSIS — C7951 Secondary malignant neoplasm of bone: Secondary | ICD-10-CM | POA: Insufficient documentation

## 2016-11-11 DIAGNOSIS — C50919 Malignant neoplasm of unspecified site of unspecified female breast: Secondary | ICD-10-CM | POA: Diagnosis not present

## 2016-11-11 DIAGNOSIS — Z170 Estrogen receptor positive status [ER+]: Secondary | ICD-10-CM | POA: Insufficient documentation

## 2016-11-11 DIAGNOSIS — C50811 Malignant neoplasm of overlapping sites of right female breast: Secondary | ICD-10-CM | POA: Insufficient documentation

## 2016-11-11 MED ORDER — GADOBENATE DIMEGLUMINE 529 MG/ML IV SOLN
20.0000 mL | Freq: Once | INTRAVENOUS | Status: AC | PRN
  Administered 2016-11-11: 20 mL via INTRAVENOUS

## 2016-11-11 NOTE — Progress Notes (Unsigned)
I called Jasmine Hood and told her the results of her MRI of the spine which shows diffuse metastases up and down the spine but no evidence of cord compression.  We will start Xgeva on July 10. We will discuss that when she returns tomorrow for her first cycle of chemotherapy.

## 2016-11-11 NOTE — Progress Notes (Signed)
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 When she noted some significant changes in her right nipple she saw [name] and was set up for bilateral diagnostic mammography with tomography and [facility]. This found the breast density to be category B. In the upper and lower outer quadrants of the right breast there was a mass measuring at least 10 cm. There were also groups of heterogeneous calcifications measuring 3 cm and a separate group 0.4 cm. On physical exam there was a palpable firm mass measuring approximately 10 cm involving the upper outer and lower outer quadrant of the right breast, with skin reaction and nipple retraction. There was no palpable right axillary adenopathy.  Right breast ultrasonography confirmed a large hypoechoic mass with irregular margins extending to the skin surface. The right axilla showed 2 lymph nodes which appeared abnormal.  In the left breast there were some indeterminate retroareolar calcifications which were felt to warrant biopsy. This was performed 10/08/2016 and showed a  fibroadenoma ([id number] [id number]).  Biopsy of the right breast mass at the 7:00 and 10:00 positions as well as one of the suspicious lymph nodes all showed invasive ductal carcinoma, E-cadherin positive. Separate prognostic profiles from the 2 breast masses were sent. Estrogen receptor was positive at 95-70%, progesterone receptor was positive at 15-85%, all with strong staining intensity, the MIB-1 was 15-20%, and HER-2 was nonamplified, the signals ratio being 1.28-1.72 and the number per cell 1.85-3.95.  The patient's subsequent history is as detailed below  INTERVAL HISTORY: Jasmine Hood is doing moderately well today.  She is here for evaluation following her first cycle of chemotherapy with [facility].  She does have some mild mucositis that popped up about 4 days ago.  She also has a lesion in her left groin underneath her skin fold that is red and itchy.  The skin is peeling from over top of it as well.    REVIEW OF SYSTEMS: Jasmine Hood had minimal nausea after chemotherapy.  She drank ginger ale and used her "pops" she got from [place].  She says she is drinking plenty of water.  Her kidney function has almost doubled today.  She says she is eating well and has no new pain.  She denies any other issues and a detailed ROS is otherwise non contributory.    PAST MEDICAL HISTORY: Past Medical History:  Diagnosis Date  . Breast cancer ([place]) 10/07/2016   bilateral breast   . [place] recluse spider bite    right leg  . COLONIC POLYPS, HX OF 01/05/2009   Qualifier: Diagnosis of  By: [name]  MD, [name]   . Complication of anesthesia    slow to awaken after wisdom  teeth extraction age 36  . HYPOTHYROIDISM  02/01/2010   Qualifier: Diagnosis of  By: [name]  MD, [name]   . Morbid obesity ([place]) 01/05/2009   Qualifier: Diagnosis of  By: [name]  MD, [name]   . Pneumonia 1987  . Sleep apnea    no cpap used     PAST SURGICAL HISTORY: Past Surgical History:  Procedure Laterality Date  . BREAST BIOPSY  Right 10/07/2016   invasive mammary carcinoma  . BREAST BIOPSY Left 10/08/2016   left  breast fibroadenoma no malignancy  . CATARACT EXTRACTION Bilateral   . COLONOSCOPY WITH PROPOFOL N/A 12/22/2015   Procedure: COLONOSCOPY WITH PROPOFOL;  Surgeon: [name], MD;  Location: [facility];  Service: Endoscopy;  Laterality: N/A;  . colonscopy  2012   with polyp removed  . MOLE REMOVAL  10/29/2016   Procedure: MOLE REMOVAL LEFT CHEST;  Surgeon: Alphonsa Overall, MD;  Location: [facility];  Service: General;;  . PORTACATH PLACEMENT N/A 10/29/2016   Procedure: INSERTION PORT-A-CATH;  Surgeon: Alphonsa Overall, MD;  Location: [facility];  Service: General;  Laterality: N/A;  . surgery for spider bite  1998  . TUBAL LIGATION    . WISDOM TOOTH EXTRACTION  age 86    FAMILY HISTORY Family History  Problem Relation Age of Onset  . Breast cancer Mother        deceased at 37  . Throat cancer Father        smoker  . Rheum arthritis Sister        3 sisters pos. for osteo and RA  . Diabetes Maternal Aunt   . Cancer Maternal Grandfather        mouth cancer-chewed tobacco  . Breast cancer Cousin 26  . Breast cancer Daughter   The patient's father died at the age of 42 from laryngeal cancer in the setting of tobacco abuse. The patient's mother died at the age of 82 from metastatic breast cancer which had been diagnosed in her early 45s. The patient has 2 half-sisters and one half-brother. One of her sisters has "stomach" cancer. There is also a cousin on the maternal side with breast cancer diagnosed at age 28. No family member has been genetically tested yet  GYNECOLOGIC HISTORY:  No LMP recorded. Patient is postmenopausal. Menarche age 55, first live birth age 27, she is [place] P5. She is status post bilateral tubal ligation. She stopped having periods in 1985. She tells me she did not use hormone replacement. However a note from her bone density scan December 2001 states "this patient began hormone replacement  therapy 3 months ago"  SOCIAL HISTORY:  She is originally from [place]. 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 She has survived 2 husband's period at home currently is just she and her daughter Jasmine Hood. She tells me this daughter had mild brain damage at birth and is not working, and cannot drive because of a history of seizures. However she is the one she is planning to name is her healthcare power of attorney. The patient has 13 grandchildren and 4 great-grandchildren. She attends a [employer]: At the 10/21/2016 visit the patient was given the appropriate documents to complete and notarize at her discretion   HEALTH MAINTENANCE: Social History  Substance Use Topics  . Smoking status: Never Smoker  . Smokeless tobacco: Never Used  . Alcohol use No     Colonoscopy: 12/22/2015/[facility]  PAP:  Bone density: December 2001 was normal  Allergies  Allergen Reactions  . Bee Venom Anaphylaxis    Current Outpatient Prescriptions  Medication Sig Dispense Refill  . acetaminophen (TYLENOL) 500 MG tablet Take 500 mg by mouth daily as needed for moderate pain or headache.    . dexamethasone (DECADRON) 4 MG tablet Take 2 tablets (8 mg total) by mouth daily. Start the day after chemotherapy for 2 days. Take with food. 30 tablet 1  . lidocaine-prilocaine (EMLA) cream Apply 1 application topically as needed. 30 g 0  . EPINEPHrine (EPIPEN 2-PAK) 0.3 mg/0.3 mL SOAJ injection Inject 0.3 mLs (0.3 mg total) into the muscle once. (Patient not taking: Reported on 11/12/2016) 1 Device 2  . HYDROcodone-acetaminophen (NORCO/VICODIN) 5-325 MG tablet Take 1-2 tablets by mouth every 6 (six) hours as needed for moderate pain. (Patient not taking: Reported on 11/12/2016) 20 tablet 0  . prochlorperazine (COMPAZINE) 10 MG tablet Take 1 tablet (10 mg total) by mouth every 6 (six) hours as needed (Nausea or vomiting). (Patient not taking: Reported on 11/12/2016)  30 tablet 1   No current facility-administered medications for this visit.     OBJECTIVE:   Vitals:   11/12/16 1023  BP: (!) 169/87  Pulse: 89  Resp: 18  Temp: 98.5 F (36.9 C)     Body mass index is 48.14 kg/m.    ECOG FS:2 - Symptomatic, <50% confined to bed GENERAL: Patient is a well appearing female in no acute distress HEENT:  Sclerae anicteric.  Mouth is erythematous with many ulcers, and irriation noted Neck is supple.  NODES:  No cervical, supraclavicular, or axillary lymphadenopathy palpated.  BREAST EXAM:  Deferred. LUNGS:  Clear to auscultation bilaterally.  No wheezes or rhonchi. HEART:  Regular rate and rhythm. No murmur appreciated. ABDOMEN:  Soft, nontender.  Positive, normoactive bowel sounds. No organomegaly palpated. MSK:  No focal spinal tenderness to palpation. Full range of motion bilaterally in the upper extremities. EXTREMITIES:  No peripheral edema.   SKIN:  Erythematous maculopapular rash around neck, erythema and skin peeling in left groin. No nail dyscrasia. NEURO:  Nonfocal. Well oriented.  Appropriate affect.     Photo right breast 10/21/2016  Photo right breast 11/12/2016--underneath     LAB RESULTS:  CMP     Component Value Date/Time   NA 144 11/12/2016 0917   K 4.4 11/12/2016 0917   CL 107 04/28/2015 1133   CO2 24 11/12/2016 0917   GLUCOSE 132 11/12/2016 0917   BUN 29.7 (H) 11/12/2016 0917   CREATININE 1.6 (H) 11/12/2016 0917   CALCIUM 9.4 11/12/2016 0917   PROT 7.1 11/12/2016 0917   ALBUMIN 3.2 (L) 11/12/2016 0917   AST 24 11/12/2016 0917   ALT 23 11/12/2016 0917   ALKPHOS 88 11/12/2016 0917   BILITOT 1.55 (H) 11/12/2016 0917   GFRNONAA 102.34 01/25/2010 0858   GFRAA  11/24/2008 1315    >60        The eGFR has been calculated using the MDRD equation. This calculation has not been validated in all clinical situations. eGFR's persistently <60 mL/min signify possible Chronic Kidney Disease.    No results found for:  TOTALPROTELP, ALBUMINELP, A1GS, A2GS, BETS, BETA2SER, GAMS, MSPIKE, SPEI  No results found for: KPAFRELGTCHN, LAMBDASER, KAPLAMBRATIO  Lab Results  Component Value Date   WBC 2.9 (L) 11/12/2016   NEUTROABS 2.0 11/12/2016   HGB 13.4 11/12/2016   HCT 39.7 11/12/2016   MCV 99.7 11/12/2016   PLT 69 (L) 11/12/2016      Chemistry  Component Value Date/Time   NA 144 11/12/2016 0917   K 4.4 11/12/2016 0917   CL 107 04/28/2015 1133   CO2 24 11/12/2016 0917   BUN 29.7 (H) 11/12/2016 0917   CREATININE 1.6 (H) 11/12/2016 0917      Component Value Date/Time   CALCIUM 9.4 11/12/2016 0917   ALKPHOS 88 11/12/2016 0917   AST 24 11/12/2016 0917   ALT 23 11/12/2016 0917   BILITOT 1.55 (H) 11/12/2016 0917       No results found for: LABCA2  No components found for: [id number]  No results for input(s): INR in the last 168 hours.  Urinalysis    Component Value Date/Time   COLORURINE LT. YELLOW 01/25/2010 0858   APPEARANCEUR CLEAR 01/25/2010 0858   LABSPEC >=1.030 01/25/2010 0858   PHURINE 5.5 01/25/2010 0858   GLUCOSEU NEGATIVE 01/25/2010 0858   HGBUR 1+ 12/30/2008 0838   BILIRUBINUR Neg 04/28/2015 1122   KETONESUR 15 (?) 01/25/2010 0858   PROTEINUR Neg 04/28/2015 1122   PROTEINUR NEGATIVE 11/24/2008 1545   UROBILINOGEN 0.2 04/28/2015 1122   UROBILINOGEN 0.2 01/25/2010 0858   NITRITE Neg 04/28/2015 1122   NITRITE NEGATIVE 01/25/2010 0858   LEUKOCYTESUR small (1+) (A) 04/28/2015 1122     STUDIES: Ct Chest W Contrast  Result Date: 10/30/2016 CLINICAL DATA:  History of right breast cancer initially diagnosed in April 2018. Pre chemotherapy. EXAM: CT CHEST WITH CONTRAST TECHNIQUE: Multidetector CT imaging of the chest was performed during intravenous contrast administration. CONTRAST:  29m ISOVUE-300 IOPAMIDOL (ISOVUE-300) INJECTION 61% COMPARISON:  None. FINDINGS: Cardiovascular: The heart is normal in size. No pericardial effusion. Normal caliber thoracic aorta. Minimal  scattered atherosclerotic calcifications. No dissection. The branch vessels are patent. Minimal scattered coronary artery calcifications. Mediastinum/Nodes: Scattered mediastinal lymph nodes are indeterminate. 9 mm superior mediastinal node on image number 39 near the SVC. 8 mm precarinal lymph node on image number 51. 9.5 mm aorticopulmonary window lymph node on image number 51. 10.5 mm subcarinal lymph node on image number 64. The esophagus is grossly normal. Lungs/Pleura: No acute pulmonary findings. No pulmonary nodules to suggest pulmonary metastatic disease. Minimal dependent bibasilar atelectasis. No pleural effusion. Upper Abdomen: No significant upper abdominal findings. Suspect mild cirrhotic changes involving the liver. Chest wall/ Musculoskeletal: 4 cm irregular mass noted in the right breast consistent with patient's known breast cancer. There are enlarged right axillary lymph nodes consistent with metastatic disease. The largest node measures 13.5 mm short axis on image number 40. There is also a 10.5 mm lymph node on image number 48. No supraclavicular adenopathy. A left-sided Port-A-Cath is noted. No left-sided breast lesions or left axillary adenopathy. Mixed lytic and sclerotic appearance of the T6 vertebral body is very suspicious for a metastatic lesion. No other obvious lesions but recommend correlation with ordered nuclear medicine whole body bone scan. IMPRESSION: 1. 4 cm right breast mass and enlarged right axillary lymph nodes consistent with metastatic adenopathy. 2. Borderline mediastinal lymph nodes. No findings for pulmonary metastatic disease. 3. Worrisome T6 bone lesion suspicious for metastasis. Recommend correlation with ordered bone scan. 4. No CT findings for upper abdominal metastatic disease. 5. Suspect cirrhotic changes involving the liver. Aortic Atherosclerosis (ICD10-I70.0). Electronically Signed   By: PMarijo SanesM.D.   On: 10/30/2016 13:50   Nm Bone Scan Whole  Body  Result Date: 10/30/2016 CLINICAL DATA:  History breast malignancy. No skeletal symptoms or history of trauma. EXAM: NUCLEAR MEDICINE WHOLE BODY BONE SCAN TECHNIQUE: Whole body anterior and posterior images  were obtained approximately 3 hours after intravenous injection of radiopharmaceutical. RADIOPHARMACEUTICALS:  21.8 mCi Technetium-33mMDP IV COMPARISON:  No previous bone scans in PACs FINDINGS: There is adequate uptake of the radiopharmaceutical by the skeleton. There is adequate soft tissue clearance and renal activity. Uptake within the calvarium is normal. There is increased uptake in the upper and mid and lower thoracic spine as well as at L5-S1. No definite abnormal rib uptake is observed. Uptake in the pelvis appears normal. No significant upper or lower extremity uptake is observed. Uptake in the posterior aspect of the right calcaneus is likely degenerative in nature. IMPRESSION: Increased uptake within the thoracic spine which may reflect metastatic disease. The upper thoracic focus may correspond to the sclerotic region of T5 on today's CT scan. The increased uptake at approximately T9 and T12 does not clearly correspond to CT abnormalities but is worrisome for early metastatic disease as well. Electronically Signed   By: David  JMartiniqueM.D.   On: 10/30/2016 15:02   Mr Breast Bilateral W Wo Contrast  Addendum Date: 11/06/2016   ADDENDUM REPORT: 11/06/2016 11:00 ADDENDUM: The patient returned for an MRI guided biopsy of the left breast on November 04, 2016. Peripheral IV access could not be obtained. MRI guided biopsy was canceled. This was discussed by telephone with Dr. NLucia Gaskins The patient's breast MRI images have been reviewed by myself and Dr. MEnriqueta Shutterand a target on the noncontrast breast MRI images is identified, to correlate with the area of concern on the recent breast MRI with contrast. Therefore, we have arranged for the patient to have an MRI guided biopsy of the left breast without  contrast on November 18, 2016. Electronically Signed   By: SCurlene DolphinM.D.   On: 11/06/2016 11:00   Result Date: 11/06/2016 CLINICAL DATA:  Malignant neoplasm of right breast, stage II. Family history of breast cancer (mother and daughter). Patient had 3 ultrasound-guided biopsies on 10/07/2016 (site 1: Right breast, lower outer quadrant, 7 o'clock position - grade 2 invasive mammary carcinoma. site 2: Right breast, upper outer quadrant, 10 o'clock position - grade 2 invasive mammary carcinoma. site 3: Right axillary lymph node - positive for metastatic disease) LABS:  GFR 91, BUN 11, creatinine 0.6 EXAM: BILATERAL BREAST MRI WITH AND WITHOUT CONTRAST TECHNIQUE: Multiplanar, multisequence MR images of both breasts were obtained prior to and following the intravenous administration of 20 ml of MultiHance. THREE-DIMENSIONAL MR IMAGE RENDERING ON INDEPENDENT WORKSTATION: Three-dimensional MR images were rendered by post-processing of the original MR data on an independent workstation. The three-dimensional MR images were interpreted, and findings are reported in the following complete MRI report for this study. Three dimensional images were evaluated at the independent DynaCad workstation COMPARISON:  Previous exams including diagnostic mammogram and ultrasound dated 10/03/2016, ultrasound-guided biopsies of 10/07/2016 and stereotactic biopsy of 10/08/2016. FINDINGS: Breast composition: b. Scattered fibroglandular tissue. Background parenchymal enhancement: Mild Right breast: Extensive contiguous mass and non-mass enhancement throughout the outer right breast, upper outer quadrant and lower outer quadrant, greater than 10 cm anterior extent from posterior depth to anterior skin surface (with associated skin retraction), measuring approximately 4 cm greatest thickness and approximately 7 cm craniocaudal dimension, with mixed enhancement kinetics including washout. There are 2 sites of biopsy clip artifact at posterior  depth, upper-outer quadrant and lower outer quadrant respectively, corresponding to the 2 sites of biopsy-proven carcinoma. The additional extensive mass and non-mass enhancement, extending from posterior depth to the anterior skin surface, is consistent with associated extensive multifocal  and multicentric disease. Left breast: Linear clumped non-mass enhancement, including suspicious clustered ring enhancement at its posterior aspect, measuring 4.4 cm extent, with mixed enhancement kinetics including some washout (series 9, images 154-162). Post biopsy change within the posterior left breast corresponding to the site of recent benign breast biopsy (stereotactic biopsy for calcifications revealing fibroadenoma). Lymph nodes: 4 enlarged/morphologically abnormal level 1 lymph nodes within the right axilla, 1 of which with associated biopsy clip artifact corresponding to biopsy-proven metastatic disease. No enlarged or morphologically abnormal lymph nodes within the left axilla or bilateral internal mammary chain regions. Ancillary findings:  None. IMPRESSION: 1. Linear clumped non-mass enhancement (including highly suspicious clustered ring enhancement at its posterior aspect) within the upper LEFT breast, at ANTERIOR depth, measuring 4.4 cm extent. This is a suspicious finding for contralateral disease and MRI-guided biopsy is recommended. 2. Extensive contiguous mass and non-mass enhancement throughout the outer right breast, involving the upper-outer quadrant and lower outer quadrants, measuring greater than 10 cm anterior extend from posterior depth to anterior skin surface (with associated skin retraction), including 2 sites of biopsy-proven carcinoma at posterior depth with associated biopsy clip artifacts. The additional contiguous mass and non-mass enhancement throughout the outer right breast is consistent with extensive multifocal and multicentric disease. 3. At least 4 enlarged/morphologically abnormal  level 1 lymph nodes within the right axilla. 4. Post biopsy change within the left breast at POSTERIOR depth, corresponding to the site of benign stereotactic biopsy for calcifications with pathology result of fibroadenoma. RECOMMENDATION: MRI-guided biopsy for the suspicious linear clumped non-mass enhancement (including suspicious clustered ring enhancement posteriorly) within the upper LEFT breast, at anterior depth, with preferential sampling of the clustered ring enhancement if possible. BI-RADS CATEGORY  4: Suspicious. Electronically Signed: By: Franki Cabot M.D. On: 10/21/2016 11:35   Dg Chest Port 1 View  Result Date: 10/29/2016 CLINICAL DATA:  Post-op Port-a-cath placement. EXAM: PORTABLE CHEST 1 VIEW COMPARISON:  None. FINDINGS: Left anterior chest wall Port-A-Cath extends through the left subclavian vein. Tip projects in the lower superior vena cava just above the caval atrial junction. No pneumothorax. Cardiac silhouette is normal in size. No mediastinal or hilar masses or evidence of adenopathy. Clear lungs. IMPRESSION: Left anterior chest wall Port-A-Cath is well positioned, tip lying in the lower superior vena cava just above the caval atrial junction. No pneumothorax. No acute cardiopulmonary disease. Electronically Signed   By: Lajean Manes M.D.   On: 10/29/2016 13:01   Dg C-arm 1-60 Min-no Report  Result Date: 10/29/2016 Fluoroscopy was utilized by the requesting physician.  No radiographic interpretation.   Mr Total Spine Mets Screening  Result Date: 11/11/2016 CLINICAL DATA:  Stage IV metastatic breast cancer. Evaluate spinal metastatic disease. EXAM: MRI TOTAL SPINE WITHOUT AND WITH CONTRAST TECHNIQUE: Multisequence MR imaging of the spine from the cervical spine to the sacrum was performed prior to and following IV contrast administration for evaluation of spinal metastatic disease. CONTRAST:  22m MULTIHANCE GADOBENATE DIMEGLUMINE 529 MG/ML IV SOLN COMPARISON:  Nuclear medicine  study 10/30/2016.  CT 10/30/2016. FINDINGS: MRI CERVICAL SPINE FINDINGS Alignment: Normal Vertebrae: No metastatic disease at C5 or above. Metastatic disease affecting the C6 and C7 vertebral bodies with involvement of the posterior elements at C6 on the left and C7 on the right. No evidence of fracture or extraosseous tumor extension. Cord: No cord compression or cord metastasis. Posterior Fossa, vertebral arteries, paraspinal tissues: Negative as incompletely evaluated. Disc levels: Mild non-compressive spondylosis at C5-6. MRI THORACIC SPINE FINDINGS Alignment:  Normal  Vertebrae: Extensive metastatic disease throughout the thoracic region affecting all vertebral body levels. Most extensive at T1, T2, T5, T6, T9, T10, T11 and T12. No extraosseous tumor extension. Cord: Tiny enhancing focus along the dorsal aspect of the cord at T11-12. This could be a tiny surface metastasis or could be a vascular structure. Paraspinal and other soft tissues: No extraosseous tumor identified. Metastatic disease is also noted within the sternum. Disc levels: Ordinary spondylosis at T8-9.  Other levels are unremarkable. MRI LUMBAR SPINE FINDINGS Segmentation:  5 lumbar type vertebral bodies Alignment:  Normal Vertebrae: Extensive metastatic disease throughout the region, with relatively less involvement of L1. Extensive involvement also of the upper sacrum. Minor superior endplate fracture at L5 with loss of height of 10%. This could be recent related to bone weakening from the metastatic disease. No evidence of extraosseous tumor extension in the region. Conus medullaris: Extends to the L2 level and appears normal. Paraspinal and other soft tissues: Otherwise negative Disc levels: Mild disc bulge at L4-5 without stenosis or neural compression. Mild lower lumbar facet osteoarthritis. IMPRESSION: Bony metastatic disease beginning at C6 and extending through the sacrum. Involvement of every vertebral body level throughout that region.  No extraosseous tumor extension or nerve compression secondary to tumor. Question minimal superior endplate fracture at L5 possibly secondary to the metastatic disease. It is also possible this could be an older fracture. Tiny focus of enhancement along the dorsal cord at T11-12. This could be a tiny surface metastasis or a vascular structure. This appears to be along the surface of the cord rather than within the substance. Electronically Signed   By: Nelson Chimes M.D.   On: 11/11/2016 11:33    ELIGIBLE FOR AVAILABLE RESEARCH PROTOCOL: no  ASSESSMENT: 71 y.o. 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   I reviewed her MRI with her again today, and reviewed the fact that she has spine mets with her in detail.  I sent in some magic mouthwash, fluconazole, and Valtrex for her to take.  She will return in one week for lab only, xgeva.  Delton See was reviewed with her by Dr. [name], who spoke with her about her spine mets and prognosis at the end of today's visit.    I have encouraged her to call with any questions or concerns that may develop before her next visit.  Scot Dock, NP   11/12/2016 11:08 AM Medical Oncology and Hematology [facility] [address] [place], [place] [address] Tel. [phone]    Fax. [phone]   ADDENDUM: I called the yesterday to tell her the results of her spinal films and I reviewed those again with her today. She understands she has stage IV breast cancer. She understands stage IV breast cancer is not curable.  The good news in her case is that we don't see visceral disease. Patients with bone only metastases generally do considerably better. We are hoping that will be her situation.  We are continuing with chemotherapy as planned because we need to obtain optimal local control as well as control the metastatic disease. Once we completed chemotherapy and she has her surgery, likely a modified radical mastectomy, we will switch to anti-estrogens. She will also need adjuvant radiation  We discussed starting denosumab/Xgeva. She has a good understanding of the possible toxicities, side effects and complications of these agents including the rare cases of osteonecrosis of the jaw and the much more common bony aches and pains for 1 or 2 days. She does have pain medication available.  As far as her mucositis is concerned she will be on Valtrex for the 6 months of chemotherapy and she will take Diflucan for 10 days after each chemotherapy dose. Of course we're also providing her with a mouthwash.  She is also scheduled for genetics testing on July 3. She will see me again in July 10. She knows to call for any problems that may develop before that visit.     I personally saw this patient and performed a substantive portion of this encounter with the listed APP documented above.   Chauncey Cruel, MD Medical Oncology and Hematology [facility] [address] [place], [place] [address] Tel. [phone]    Fax. [phone]

## 2016-11-12 ENCOUNTER — Ambulatory Visit (HOSPITAL_BASED_OUTPATIENT_CLINIC_OR_DEPARTMENT_OTHER): Payer: Medicare HMO

## 2016-11-12 ENCOUNTER — Ambulatory Visit (HOSPITAL_BASED_OUTPATIENT_CLINIC_OR_DEPARTMENT_OTHER): Payer: Medicare HMO | Admitting: Adult Health

## 2016-11-12 ENCOUNTER — Other Ambulatory Visit (HOSPITAL_BASED_OUTPATIENT_CLINIC_OR_DEPARTMENT_OTHER): Payer: Medicare HMO

## 2016-11-12 VITALS — BP 169/87 | HR 89 | Temp 98.5°F | Resp 18 | Ht 59.5 in | Wt 242.4 lb

## 2016-11-12 DIAGNOSIS — C7951 Secondary malignant neoplasm of bone: Secondary | ICD-10-CM | POA: Diagnosis not present

## 2016-11-12 DIAGNOSIS — C50811 Malignant neoplasm of overlapping sites of right female breast: Secondary | ICD-10-CM | POA: Diagnosis not present

## 2016-11-12 DIAGNOSIS — Z170 Estrogen receptor positive status [ER+]: Principal | ICD-10-CM

## 2016-11-12 DIAGNOSIS — C773 Secondary and unspecified malignant neoplasm of axilla and upper limb lymph nodes: Secondary | ICD-10-CM | POA: Diagnosis not present

## 2016-11-12 DIAGNOSIS — Z452 Encounter for adjustment and management of vascular access device: Secondary | ICD-10-CM | POA: Diagnosis not present

## 2016-11-12 DIAGNOSIS — K7469 Other cirrhosis of liver: Secondary | ICD-10-CM

## 2016-11-12 DIAGNOSIS — Z95828 Presence of other vascular implants and grafts: Secondary | ICD-10-CM

## 2016-11-12 DIAGNOSIS — Z6841 Body Mass Index (BMI) 40.0 and over, adult: Secondary | ICD-10-CM

## 2016-11-12 LAB — CBC WITH DIFFERENTIAL/PLATELET
BASO%: 0.7 % (ref 0.0–2.0)
Basophils Absolute: 0 10*3/uL (ref 0.0–0.1)
EOS%: 2.8 % (ref 0.0–7.0)
Eosinophils Absolute: 0.1 10*3/uL (ref 0.0–0.5)
HCT: 39.7 % (ref 34.8–46.6)
HGB: 13.4 g/dL (ref 11.6–15.9)
LYMPH%: 29.3 % (ref 14.0–49.7)
MCH: 33.7 pg (ref 25.1–34.0)
MCHC: 33.8 g/dL (ref 31.5–36.0)
MCV: 99.7 fL (ref 79.5–101.0)
MONO#: 0 10*3/uL — AB (ref 0.1–0.9)
MONO%: 0 % (ref 0.0–14.0)
NEUT%: 67.2 % (ref 38.4–76.8)
NEUTROS ABS: 2 10*3/uL (ref 1.5–6.5)
PLATELETS: 69 10*3/uL — AB (ref 145–400)
RBC: 3.98 10*6/uL (ref 3.70–5.45)
RDW: 11.9 % (ref 11.2–14.5)
WBC: 2.9 10*3/uL — AB (ref 3.9–10.3)
lymph#: 0.9 10*3/uL (ref 0.9–3.3)

## 2016-11-12 LAB — COMPREHENSIVE METABOLIC PANEL
ALT: 23 U/L (ref 0–55)
ANION GAP: 12 meq/L — AB (ref 3–11)
AST: 24 U/L (ref 5–34)
Albumin: 3.2 g/dL — ABNORMAL LOW (ref 3.5–5.0)
Alkaline Phosphatase: 88 U/L (ref 40–150)
BILIRUBIN TOTAL: 1.55 mg/dL — AB (ref 0.20–1.20)
BUN: 29.7 mg/dL — ABNORMAL HIGH (ref 7.0–26.0)
CHLORIDE: 107 meq/L (ref 98–109)
CO2: 24 meq/L (ref 22–29)
Calcium: 9.4 mg/dL (ref 8.4–10.4)
Creatinine: 1.6 mg/dL — ABNORMAL HIGH (ref 0.6–1.1)
EGFR: 32 mL/min/{1.73_m2} — AB (ref >90)
Glucose: 132 mg/dl (ref 70–140)
POTASSIUM: 4.4 meq/L (ref 3.5–5.1)
SODIUM: 144 meq/L (ref 136–145)
Total Protein: 7.1 g/dL (ref 6.4–8.3)

## 2016-11-12 MED ORDER — FLUCONAZOLE 200 MG PO TABS: 200.0000 mg | ORAL_TABLET | Freq: Every day | ORAL | 5 refills | Status: DC

## 2016-11-12 MED ORDER — VALACYCLOVIR HCL 500 MG PO TABS: 500.0000 mg | ORAL_TABLET | Freq: Every day | ORAL | 5 refills | Status: DC

## 2016-11-12 MED ORDER — MAGIC MOUTHWASH W/LIDOCAINE: 5.0000 mL | Freq: Four times a day (QID) | ORAL | 0 refills | Status: DC | PRN

## 2016-11-12 MED ORDER — SODIUM CHLORIDE 0.9% FLUSH
10.0000 mL | INTRAVENOUS | Status: DC | PRN
  Administered 2016-11-12: 10 mL via INTRAVENOUS
  Filled 2016-11-12: qty 10

## 2016-11-12 NOTE — Pre-Procedure Instructions (Signed)
Right breast June 26

## 2016-11-13 ENCOUNTER — Other Ambulatory Visit: Payer: Self-pay | Admitting: *Deleted

## 2016-11-13 ENCOUNTER — Other Ambulatory Visit: Payer: Self-pay | Admitting: Oncology

## 2016-11-14 ENCOUNTER — Telehealth: Payer: Self-pay | Admitting: *Deleted

## 2016-11-14 ENCOUNTER — Ambulatory Visit (HOSPITAL_BASED_OUTPATIENT_CLINIC_OR_DEPARTMENT_OTHER): Payer: Medicare HMO

## 2016-11-14 ENCOUNTER — Other Ambulatory Visit: Payer: Self-pay | Admitting: Adult Health

## 2016-11-14 ENCOUNTER — Other Ambulatory Visit: Payer: Self-pay | Admitting: *Deleted

## 2016-11-14 ENCOUNTER — Other Ambulatory Visit: Payer: Medicare HMO

## 2016-11-14 DIAGNOSIS — Z452 Encounter for adjustment and management of vascular access device: Secondary | ICD-10-CM

## 2016-11-14 DIAGNOSIS — Z170 Estrogen receptor positive status [ER+]: Principal | ICD-10-CM

## 2016-11-14 DIAGNOSIS — C50811 Malignant neoplasm of overlapping sites of right female breast: Secondary | ICD-10-CM

## 2016-11-14 DIAGNOSIS — Z95828 Presence of other vascular implants and grafts: Secondary | ICD-10-CM

## 2016-11-14 DIAGNOSIS — C7951 Secondary malignant neoplasm of bone: Secondary | ICD-10-CM

## 2016-11-14 MED ORDER — HEPARIN SOD (PORK) LOCK FLUSH 100 UNIT/ML IV SOLN
500.0000 [IU] | Freq: Once | INTRAVENOUS | Status: AC
  Administered 2016-11-14: 500 [IU]
  Filled 2016-11-14: qty 5

## 2016-11-14 MED ORDER — SODIUM CHLORIDE 0.9% FLUSH
10.0000 mL | Freq: Once | INTRAVENOUS | Status: AC
  Administered 2016-11-14: 10 mL
  Filled 2016-11-14: qty 10

## 2016-11-14 MED ORDER — TRIAMCINOLONE ACETONIDE 0.5 % EX OINT: 1.0000 "application " | TOPICAL_OINTMENT | Freq: Two times a day (BID) | CUTANEOUS | 0 refills | Status: DC

## 2016-11-14 NOTE — Telephone Encounter (Signed)
Returned patient's call regarding rash she has all over her body- she states "a cream was suppose to be called into the pharmacy, picked up the other two medication but need the cream called into the [facility] in [place] on [address]. A message sent to Mendel Ryder, NP regarding this message.

## 2016-11-18 ENCOUNTER — Ambulatory Visit
Admission: RE | Admit: 2016-11-18 | Discharge: 2016-11-18 | Disposition: A | Discharge status: Home / Self Care | Payer: Medicare HMO | Source: Ambulatory Visit | Attending: Surgery | Admitting: Surgery

## 2016-11-18 DIAGNOSIS — N6092 Unspecified benign mammary dysplasia of left breast: Secondary | ICD-10-CM | POA: Diagnosis not present

## 2016-11-18 DIAGNOSIS — N6320 Unspecified lump in the left breast, unspecified quadrant: Secondary | ICD-10-CM

## 2016-11-18 DIAGNOSIS — R928 Other abnormal and inconclusive findings on diagnostic imaging of breast: Secondary | ICD-10-CM | POA: Diagnosis not present

## 2016-11-18 DIAGNOSIS — N6489 Other specified disorders of breast: Secondary | ICD-10-CM | POA: Diagnosis not present

## 2016-11-19 ENCOUNTER — Ambulatory Visit (HOSPITAL_BASED_OUTPATIENT_CLINIC_OR_DEPARTMENT_OTHER): Payer: Medicare HMO

## 2016-11-19 ENCOUNTER — Telehealth: Payer: Self-pay | Admitting: *Deleted

## 2016-11-19 ENCOUNTER — Other Ambulatory Visit (HOSPITAL_BASED_OUTPATIENT_CLINIC_OR_DEPARTMENT_OTHER): Payer: Medicare HMO

## 2016-11-19 ENCOUNTER — Encounter: Payer: Self-pay | Admitting: Genetics

## 2016-11-19 ENCOUNTER — Ambulatory Visit (HOSPITAL_BASED_OUTPATIENT_CLINIC_OR_DEPARTMENT_OTHER): Payer: Medicare HMO | Admitting: Genetics

## 2016-11-19 DIAGNOSIS — C7951 Secondary malignant neoplasm of bone: Secondary | ICD-10-CM | POA: Diagnosis not present

## 2016-11-19 DIAGNOSIS — Z803 Family history of malignant neoplasm of breast: Secondary | ICD-10-CM

## 2016-11-19 DIAGNOSIS — Z6841 Body Mass Index (BMI) 40.0 and over, adult: Secondary | ICD-10-CM

## 2016-11-19 DIAGNOSIS — Z170 Estrogen receptor positive status [ER+]: Principal | ICD-10-CM

## 2016-11-19 DIAGNOSIS — C50811 Malignant neoplasm of overlapping sites of right female breast: Secondary | ICD-10-CM

## 2016-11-19 DIAGNOSIS — K7469 Other cirrhosis of liver: Secondary | ICD-10-CM

## 2016-11-19 DIAGNOSIS — Z7183 Encounter for nonprocreative genetic counseling: Secondary | ICD-10-CM

## 2016-11-19 DIAGNOSIS — Z452 Encounter for adjustment and management of vascular access device: Secondary | ICD-10-CM | POA: Diagnosis not present

## 2016-11-19 DIAGNOSIS — Z800 Family history of malignant neoplasm of digestive organs: Secondary | ICD-10-CM | POA: Diagnosis not present

## 2016-11-19 DIAGNOSIS — Z95828 Presence of other vascular implants and grafts: Secondary | ICD-10-CM

## 2016-11-19 DIAGNOSIS — C50911 Malignant neoplasm of unspecified site of right female breast: Secondary | ICD-10-CM

## 2016-11-19 LAB — CBC WITH DIFFERENTIAL/PLATELET
BASO%: 0.6 % (ref 0.0–2.0)
Basophils Absolute: 0 10*3/uL (ref 0.0–0.1)
EOS%: 4.5 % (ref 0.0–7.0)
Eosinophils Absolute: 0.1 10*3/uL (ref 0.0–0.5)
HEMATOCRIT: 35 % (ref 34.8–46.6)
HGB: 12.1 g/dL (ref 11.6–15.9)
LYMPH%: 54.5 % — AB (ref 14.0–49.7)
MCH: 33.2 pg (ref 25.1–34.0)
MCHC: 34.6 g/dL (ref 31.5–36.0)
MCV: 96.2 fL (ref 79.5–101.0)
MONO#: 0.5 10*3/uL (ref 0.1–0.9)
MONO%: 30.8 % — AB (ref 0.0–14.0)
NEUT%: 9.6 % — ABNORMAL LOW (ref 38.4–76.8)
NEUTROS ABS: 0.2 10*3/uL — AB (ref 1.5–6.5)
Platelets: 95 10*3/uL — ABNORMAL LOW (ref 145–400)
RBC: 3.64 10*6/uL — ABNORMAL LOW (ref 3.70–5.45)
RDW: 11.4 % (ref 11.2–14.5)
WBC: 1.6 10*3/uL — AB (ref 3.9–10.3)
lymph#: 0.9 10*3/uL (ref 0.9–3.3)
nRBC: 0 % (ref 0–0)

## 2016-11-19 LAB — COMPREHENSIVE METABOLIC PANEL
ALT: 40 U/L (ref 0–55)
AST: 29 U/L (ref 5–34)
Albumin: 2.8 g/dL — ABNORMAL LOW (ref 3.5–5.0)
Alkaline Phosphatase: 92 U/L (ref 40–150)
Anion Gap: 12 mEq/L — ABNORMAL HIGH (ref 3–11)
BUN: 21.1 mg/dL (ref 7.0–26.0)
CHLORIDE: 103 meq/L (ref 98–109)
CO2: 25 mEq/L (ref 22–29)
CREATININE: 1.2 mg/dL — AB (ref 0.6–1.1)
Calcium: 9 mg/dL (ref 8.4–10.4)
EGFR: 46 mL/min/{1.73_m2} — ABNORMAL LOW (ref >90)
GLUCOSE: 177 mg/dL — AB (ref 70–140)
Potassium: 3.4 mEq/L — ABNORMAL LOW (ref 3.5–5.1)
Sodium: 140 mEq/L (ref 136–145)
Total Bilirubin: 0.93 mg/dL (ref 0.20–1.20)
Total Protein: 6.8 g/dL (ref 6.4–8.3)

## 2016-11-19 MED ORDER — HEPARIN SOD (PORK) LOCK FLUSH 100 UNIT/ML IV SOLN
500.0000 [IU] | Freq: Once | INTRAVENOUS | Status: AC
  Administered 2016-11-19: 500 [IU]
  Filled 2016-11-19: qty 5

## 2016-11-19 MED ORDER — CIPROFLOXACIN HCL 500 MG PO TABS: 500.0000 mg | ORAL_TABLET | Freq: Two times a day (BID) | ORAL | 0 refills | Status: DC

## 2016-11-19 MED ORDER — SODIUM CHLORIDE 0.9% FLUSH
10.0000 mL | Freq: Once | INTRAVENOUS | Status: AC
  Administered 2016-11-19: 10 mL
  Filled 2016-11-19: qty 10

## 2016-11-19 MED ORDER — DENOSUMAB 120 MG/1.7ML ~~LOC~~ SOLN
120.0000 mg | Freq: Once | SUBCUTANEOUS | Status: AC
  Administered 2016-11-19: 120 mg via SUBCUTANEOUS
  Filled 2016-11-19: qty 1.7

## 2016-11-19 NOTE — Patient Instructions (Signed)

## 2016-11-19 NOTE — Progress Notes (Signed)
REFERRING PROVIDER: Chauncey Cruel, MD [address] [place], [place] [address]  PRIMARY PROVIDER:  Marletta Lor, MD  PRIMARY REASON FOR VISIT:  1. Malignant neoplasm of right breast in female, estrogen receptor positive, unspecified site of breast ([place])   2. Family history of breast cancer   3. Family history of stomach cancer      HISTORY OF PRESENT ILLNESS:   Jasmine Hood, a 71 y.o. female, was seen for a  cancer genetics consultation at the request of Jasmine Hood due to a personal and family history of cancer.  Jasmine Hood presents to clinic today to discuss the possibility of a hereditary predisposition to cancer, genetic testing, and to further clarify her future cancer risks, as well as potential cancer risks for family members. She was accompanied to her appointment by her daughter, Jasmine Hood.   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 She is currently undergoing neoadjuvant chemotherapy. She reports a history of approximately 3 colon polyps over her lifetime.  Past Medical History:  Diagnosis Date  . Breast cancer ([place]) 10/07/2016   bilateral breast   . [place] Shark recluse spider bite    right leg  . COLONIC POLYPS, HX OF 01/05/2009   Qualifier: Diagnosis of  By: Jasmine Logan  MD, Jasmine Hood   . Complication of anesthesia    slow to awaken after wisdom  teeth extraction age 62  . HYPOTHYROIDISM 02/01/2010   Qualifier: Diagnosis of  By: Jasmine Logan  MD, Jasmine Hood   . Morbid obesity ([place]) 01/05/2009   Qualifier: Diagnosis of  By: Jasmine Logan  MD, Jasmine Hood   . Pneumonia 1987  . Sleep apnea    no cpap used     Past Surgical History:  Procedure Laterality Date  . BREAST BIOPSY Right 10/07/2016   invasive mammary carcinoma  . BREAST BIOPSY Left 10/08/2016   left  breast fibroadenoma no malignancy  . CATARACT EXTRACTION Bilateral   . COLONOSCOPY WITH PROPOFOL N/A  12/22/2015   Procedure: COLONOSCOPY WITH PROPOFOL;  Surgeon: Jasmine Pole, MD;  Location: [facility];  Service: Endoscopy;  Laterality: N/A;  . colonscopy  2012   with polyp removed  . MOLE REMOVAL  10/29/2016   Procedure: MOLE REMOVAL LEFT CHEST;  Surgeon: Jasmine Overall, MD;  Location: [facility];  Service: General;;  . PORTACATH PLACEMENT N/A 10/29/2016   Procedure: INSERTION PORT-A-CATH;  Surgeon: Jasmine Overall, MD;  Location: [facility];  Service: General;  Laterality: N/A;  . surgery for spider bite  1998  . TUBAL LIGATION    . WISDOM TOOTH EXTRACTION  age 59    Social History   Social History  . Marital status: Married    Spouse Jasmine: N/A  . Number of children: 5  . Years of education: N/A   Occupational History  . retired    Social History Main Topics  . Smoking status: Never Smoker  . Smokeless tobacco: Never Used  . Alcohol use No  . Drug use: No  . Sexual activity: Not on file   Other Topics Concern  . Not on file   Social History Narrative  . No narrative on file     FAMILY HISTORY:  We obtained a detailed, 4-generation family history.  Significant diagnoses are listed below: Family History  Problem Relation Age of Onset  . Breast cancer Mother 8       d.52 from  metastatic disease  . Throat cancer Father        d.65 history of smoking  . Rheum arthritis Sister        3 sisters pos. for osteo and RA  . Diabetes Maternal Aunt   . Cancer Maternal Grandfather        mouth cancer-chewed tobacco  . Breast cancer Cousin 65  . Breast cancer Daughter 71  . Stomach cancer Sister 93   Jasmine Hood has three full sisters, ages 53, 19, and 82. The 26 year old sister, "Jasmine Hood", has a reported history of stomach cancer in her 15s. Jasmine Hood has no further details about Jasmine Hood's diagnosis, treatment, or current health. Jasmine Hood has one maternal half-sister, Jasmine Hood, who is 39 without cancers. Jasmine Hood also has two paternal half-brothers, Jasmine Hood and Jasmine Hood, and one  paternal half-sister, Jasmine Hood. To her knowledge, Jasmine Hood's paternal half-siblings do not have a history of cancer, though information is limited.   Jasmine Hood has two sons and three daughters. Her sons are 78 and 41 and have no cancers. Her daughters are 31, 44, and 55. The 41 year old daughter, Jasmine Hood, was diagnosed with breast cancer last year and is reported to be doing well. The 41 year old daughter, Jasmine Hood, is currently undergoing work-up for a lump in her breast. It is unclear if this is cancer.  Jasmine Hood mother died from metastatic breast cancer at age 39. Jasmine Hood reports that her mother was around the age of 46 at the time of her initial diagnosis. Jasmine Hood had two maternal aunts and two maternal uncles. All are deceased and did not have cancer. However, her uncle Jasmine Hood's daughter died with breast cancer. Jasmine Hood maternal grandfather died with mouth cancer. He chewed tobacco. Jasmine Hood maternal grandmother did not have cancer.  Jasmine Hood father died at age 34 and had a history of throat/laryngeal cancer. He had a history of smoking. Jasmine Hood had one paternal aunt who died in her 83s from an infected spider bite. Jasmine Hood has no paternal cousins. Neither paternal grandparent was known to have cancer.  Jasmine Hood is unaware of previous family history of genetic testing for hereditary cancer risks. Patient's maternal ancestors are of "[place]" and Jewish descent, and paternal ancestors are of "[place]" descent. There is no reported Ashkenazi Jewish ancestry. There is no known consanguinity.  GENETIC COUNSELING ASSESSMENT: Jasmine Hood is a 71 y.o. female with a personal and family which is somewhat suggestive of a hereditary cancer syndrome and predisposition to cancer. We, therefore, discussed and recommended the following at today's visit.   DISCUSSION: We reviewed the characteristics, features and inheritance patterns of hereditary cancer syndromes. We also  discussed genetic testing, including the appropriate family members to test, the process of testing, insurance coverage and turn-around-time for results. We discussed the implications of a negative, positive and/or variant of uncertain significant result. We recommended Ms. Shaw pursue genetic testing for the Common Hereditary Cancers Panel offered by [facility]. [facility]'s Common Hereditary Cancers Panel includes analysis of the following 46 genes: APC, ATM, AXIN2, BARD1, BMPR1A, BRCA1, BRCA2, BRIP1, CDH1, CDKN2A, CHEK2, CTNNA1, DICER1, EPCAM, GREM1, HOXB13, KIT, MEN1, MLH1, MSH2, MSH3, MSH6, MUTYH, NBN, NF1, NTHL1, PALB2, PDGFRA, PMS2, POLD1, Hood, PTEN, RAD50, RAD51C, RAD51D, SDHA, SDHB, SDHC, SDHD, SMAD4, SMARCA4, STK11, TP53, TSC1, TSC2, and VHL.  Based on Ms. Marini's personal and family history of cancer, she meets medical criteria for genetic testing. Despite that she meets criteria, she may still have an out of pocket cost.  We discussed that if her out of pocket cost for testing is over $100, the laboratory will call and confirm whether she wants to proceed with testing.  If the out of pocket cost of testing is less than $100 she will be billed by the genetic testing laboratory. Because Ms. Fonner expressed concern about ANY out of pocket expense, we requested that the lab contact her if there is ANY out of pocket expense associated with her testing.  PLAN: After considering the risks, benefits, and limitations, Ms. Hendriks  provided informed consent to pursue genetic testing and the blood sample was sent to [facility] for analysis of the 46-gene Common Hereditary Cancers Panel. Results should be available within approximately 3 weeks' time, at which point they will be disclosed by telephone to Ms. Bessette, as will any additional recommendations warranted by these results. This information will also be available in Epic.   Lastly, we encouraged Ms. Willcox to remain in contact with cancer  genetics annually so that we can continuously update the family history and inform her of any changes in cancer genetics and testing that may be of benefit for this family.   Ms.  Gentz questions were answered to her satisfaction today. Our contact information was provided should additional questions or concerns arise. Thank you for the referral and allowing [place] to share in the care of your patient.   Mal Misty, MS, [facility] Certified Naval [email or web address] phone: [phone]  The patient was seen for a total of 40 minutes in face-to-face genetic counseling.    _______________________________________________________________________ For Office Staff:  Number of people involved in session: 2 Was an Intern/ student involved with case: no

## 2016-11-19 NOTE — Telephone Encounter (Signed)
This RN attempted to reach patient x 2 per lab with [place] of 0.2 ( treated 11/05/2016 ) with answer by VM each time.  Prescription for antibiotic sent to patient's pharmacy for prophylactic coverage.  This RN attempted to call again before leaving for the day and was able to speak to patient- reviewed with pt low ANC and need for antibiotic.   Pt denies any fevers but does state " I still got a sore throat "  Informed pt name of medication with pt verifying no known allergy.  Also reviewed " neutropenic " concerns and with pt verifying " I got that blue chemo card ".  Pt will go to the pharmacy now and obtain the above medication.  No other needs at this time.

## 2016-11-26 ENCOUNTER — Ambulatory Visit (HOSPITAL_BASED_OUTPATIENT_CLINIC_OR_DEPARTMENT_OTHER): Payer: Medicare HMO | Admitting: Oncology

## 2016-11-26 ENCOUNTER — Other Ambulatory Visit (HOSPITAL_BASED_OUTPATIENT_CLINIC_OR_DEPARTMENT_OTHER): Payer: Medicare HMO

## 2016-11-26 ENCOUNTER — Encounter: Payer: Self-pay | Admitting: Oncology

## 2016-11-26 ENCOUNTER — Ambulatory Visit: Payer: Medicare HMO

## 2016-11-26 ENCOUNTER — Ambulatory Visit (HOSPITAL_BASED_OUTPATIENT_CLINIC_OR_DEPARTMENT_OTHER): Payer: Medicare HMO

## 2016-11-26 VITALS — BP 155/74 | HR 74 | Temp 98.0°F | Resp 18

## 2016-11-26 VITALS — BP 151/75 | HR 88 | Temp 98.0°F | Resp 18 | Ht 59.5 in | Wt 239.1 lb

## 2016-11-26 DIAGNOSIS — Z6841 Body Mass Index (BMI) 40.0 and over, adult: Secondary | ICD-10-CM

## 2016-11-26 DIAGNOSIS — K7469 Other cirrhosis of liver: Secondary | ICD-10-CM

## 2016-11-26 DIAGNOSIS — Z170 Estrogen receptor positive status [ER+]: Secondary | ICD-10-CM

## 2016-11-26 DIAGNOSIS — C773 Secondary and unspecified malignant neoplasm of axilla and upper limb lymph nodes: Secondary | ICD-10-CM | POA: Diagnosis not present

## 2016-11-26 DIAGNOSIS — C50811 Malignant neoplasm of overlapping sites of right female breast: Secondary | ICD-10-CM

## 2016-11-26 DIAGNOSIS — Z5111 Encounter for antineoplastic chemotherapy: Secondary | ICD-10-CM

## 2016-11-26 DIAGNOSIS — C7951 Secondary malignant neoplasm of bone: Secondary | ICD-10-CM

## 2016-11-26 LAB — COMPREHENSIVE METABOLIC PANEL
ALBUMIN: 2.8 g/dL — AB (ref 3.5–5.0)
ALT: 34 U/L (ref 0–55)
AST: 45 U/L — AB (ref 5–34)
Alkaline Phosphatase: 100 U/L (ref 40–150)
Anion Gap: 8 mEq/L (ref 3–11)
BUN: 8.3 mg/dL (ref 7.0–26.0)
CHLORIDE: 105 meq/L (ref 98–109)
CO2: 27 mEq/L (ref 22–29)
CREATININE: 0.8 mg/dL (ref 0.6–1.1)
Calcium: 8.9 mg/dL (ref 8.4–10.4)
EGFR: 79 mL/min/{1.73_m2} — ABNORMAL LOW (ref >90)
GLUCOSE: 122 mg/dL (ref 70–140)
POTASSIUM: 3.4 meq/L — AB (ref 3.5–5.1)
SODIUM: 141 meq/L (ref 136–145)
Total Bilirubin: 0.44 mg/dL (ref 0.20–1.20)
Total Protein: 6.9 g/dL (ref 6.4–8.3)

## 2016-11-26 LAB — CBC WITH DIFFERENTIAL/PLATELET
BASO%: 0.6 % (ref 0.0–2.0)
BASOS ABS: 0 10*3/uL (ref 0.0–0.1)
EOS%: 0.7 % (ref 0.0–7.0)
Eosinophils Absolute: 0 10*3/uL (ref 0.0–0.5)
HCT: 35 % (ref 34.8–46.6)
HEMOGLOBIN: 12.1 g/dL (ref 11.6–15.9)
LYMPH#: 1.6 10*3/uL (ref 0.9–3.3)
LYMPH%: 30 % (ref 14.0–49.7)
MCH: 33.6 pg (ref 25.1–34.0)
MCHC: 34.5 g/dL (ref 31.5–36.0)
MCV: 97.4 fL (ref 79.5–101.0)
MONO#: 0.6 10*3/uL (ref 0.1–0.9)
MONO%: 11.4 % (ref 0.0–14.0)
NEUT#: 3.1 10*3/uL (ref 1.5–6.5)
NEUT%: 57.3 % (ref 38.4–76.8)
Platelets: 211 10*3/uL (ref 145–400)
RBC: 3.6 10*6/uL — ABNORMAL LOW (ref 3.70–5.45)
RDW: 12 % (ref 11.2–14.5)
WBC: 5.3 10*3/uL (ref 3.9–10.3)

## 2016-11-26 LAB — TECHNOLOGIST REVIEW

## 2016-11-26 MED ORDER — DEXAMETHASONE SODIUM PHOSPHATE 10 MG/ML IJ SOLN
10.0000 mg | Freq: Once | INTRAMUSCULAR | Status: AC
Start: 2016-11-26 — End: 2016-11-26
  Administered 2016-11-26: 10 mg via INTRAVENOUS

## 2016-11-26 MED ORDER — FLUOROURACIL CHEMO INJECTION 2.5 GM/50ML
600.0000 mg/m2 | Freq: Once | INTRAVENOUS | Status: AC
  Administered 2016-11-26: 1300 mg via INTRAVENOUS
  Filled 2016-11-26: qty 26

## 2016-11-26 MED ORDER — PALONOSETRON HCL INJECTION 0.25 MG/5ML
INTRAVENOUS | Status: AC
  Filled 2016-11-26: qty 5

## 2016-11-26 MED ORDER — SODIUM CHLORIDE 0.9 % IV SOLN
600.0000 mg/m2 | Freq: Once | INTRAVENOUS | Status: AC
  Administered 2016-11-26: 1300 mg via INTRAVENOUS
  Filled 2016-11-26: qty 65

## 2016-11-26 MED ORDER — SODIUM CHLORIDE 0.9% FLUSH
10.0000 mL | INTRAVENOUS | Status: DC | PRN
  Administered 2016-11-26: 10 mL
  Filled 2016-11-26: qty 10

## 2016-11-26 MED ORDER — HEPARIN SOD (PORK) LOCK FLUSH 100 UNIT/ML IV SOLN
500.0000 [IU] | Freq: Once | INTRAVENOUS | Status: AC | PRN
  Administered 2016-11-26: 500 [IU]
  Filled 2016-11-26: qty 5

## 2016-11-26 MED ORDER — LIDOCAINE-PRILOCAINE 2.5-2.5 % EX CREA: 1.0000 "application " | TOPICAL_CREAM | CUTANEOUS | 0 refills | Status: DC | PRN

## 2016-11-26 MED ORDER — DEXAMETHASONE SODIUM PHOSPHATE 10 MG/ML IJ SOLN
INTRAMUSCULAR | Status: AC
  Filled 2016-11-26: qty 1

## 2016-11-26 MED ORDER — PALONOSETRON HCL INJECTION 0.25 MG/5ML
0.2500 mg | Freq: Once | INTRAVENOUS | Status: AC
Start: 2016-11-26 — End: 2016-11-26
  Administered 2016-11-26: 0.25 mg via INTRAVENOUS

## 2016-11-26 MED ORDER — FLUCONAZOLE 100 MG PO TABS: 100.0000 mg | ORAL_TABLET | Freq: Every day | ORAL | 1 refills | Status: DC

## 2016-11-26 MED ORDER — METHOTREXATE SODIUM (PF) CHEMO INJECTION 250 MG/10ML
39.0000 mg/m2 | Freq: Once | INTRAMUSCULAR | Status: AC
  Administered 2016-11-26: 85 mg via INTRAVENOUS
  Filled 2016-11-26: qty 3.4

## 2016-11-26 MED ORDER — CIPROFLOXACIN HCL 500 MG PO TABS: 500.0000 mg | ORAL_TABLET | Freq: Two times a day (BID) | ORAL | 0 refills | Status: DC

## 2016-11-26 MED ORDER — SODIUM CHLORIDE 0.9 % IV SOLN
Freq: Once | INTRAVENOUS | Status: AC
  Administered 2016-11-26: 10:00:00 via INTRAVENOUS

## 2016-11-26 MED ORDER — PROMETHAZINE HCL 25 MG RE SUPP: 25.0000 mg | Freq: Four times a day (QID) | RECTAL | 4 refills | Status: DC | PRN

## 2016-11-26 NOTE — Patient Instructions (Signed)
[place] [facility] Discharge Instructions for Patients Receiving Chemotherapy  Today you received the following chemotherapy agents Cytoxan, Methotrexate, adrucil  To help prevent nausea and vomiting after your treatment, we encourage you to take your nausea medication as directed  If you develop nausea and vomiting that is not controlled by your nausea medication, call the clinic.   BELOW ARE SYMPTOMS THAT SHOULD BE REPORTED IMMEDIATELY:  *FEVER GREATER THAN 100.5 F  *CHILLS WITH OR WITHOUT FEVER  NAUSEA AND VOMITING THAT IS NOT CONTROLLED WITH YOUR NAUSEA MEDICATION  *UNUSUAL SHORTNESS OF BREATH  *UNUSUAL BRUISING OR BLEEDING  TENDERNESS IN MOUTH AND THROAT WITH OR WITHOUT PRESENCE OF ULCERS  *URINARY PROBLEMS  *BOWEL PROBLEMS  UNUSUAL RASH Items with * indicate a potential emergency and should be followed up as soon as possible.  Feel free to call the clinic you have any questions or concerns. The clinic phone number is [phone].  Please show the [place] at check-in to the Emergency Department and triage nurse.   Hypokalemia Hypokalemia means that the amount of potassium in the blood is lower than normal.Potassium is a chemical that helps regulate the amount of fluid in the body (electrolyte). It also stimulates muscle tightening (contraction) and helps nerves work properly.Normally, most of the body's potassium is inside of cells, and only a very small amount is in the blood. Because the amount in the blood is so small, minor changes to potassium levels in the blood can be life-threatening. What are the causes? This condition may be caused by:  Antibiotic medicine.  Diarrhea or vomiting. Taking too much of a medicine that helps you have a bowel movement (laxative) can cause diarrhea and lead to hypokalemia.  Chronic kidney disease (CKD).  Medicines that help the body get rid of excess fluid (diuretics).  Eating disorders, such as  bulimia.  Low magnesium levels in the body.  Sweating a lot.  What are the signs or symptoms? Symptoms of this condition include:  Weakness.  Constipation.  Fatigue.  Muscle cramps.  Mental confusion.  Skipped heartbeats or irregular heartbeat (palpitations).  Tingling or numbness.  How is this diagnosed? This condition is diagnosed with a blood test. How is this treated? Hypokalemia can be treated by taking potassium supplements by mouth or adjusting the medicines that you take. Treatment may also include eating more foods that contain a lot of potassium. If your potassium level is very low, you may need to get potassium through an IV tube in one of your veins and be monitored in the hospital. Follow these instructions at home:  Take over-the-counter and prescription medicines only as told by your health care provider. This includes vitamins and supplements.  Eat a healthy diet. A healthy diet includes fresh fruits and vegetables, whole grains, healthy fats, and lean proteins.  If instructed, eat more foods that contain a lot of potassium, such as: ? Nuts, such as peanuts and pistachios. ? Seeds, such as sunflower seeds and pumpkin seeds. ? Peas, lentils, and lima beans. ? Whole grain and bran cereals and breads. ? Fresh fruits and vegetables, such as apricots, avocado, bananas, cantaloupe, kiwi, oranges, tomatoes, asparagus, and potatoes. ? Orange juice. ? Tomato juice. ? Red meats. ? Yogurt.  Keep all follow-up visits as told by your health care provider. This is important. Contact a health care provider if:  You have weakness that gets worse.  You feel your heart pounding or racing.  You vomit.  You have diarrhea.  You have diabetes (diabetes mellitus) and you have trouble keeping your blood sugar (glucose) in your target range. Get help right away if:  You have chest pain.  You have shortness of breath.  You have vomiting or diarrhea that lasts for  more than 2 days.  You faint. This information is not intended to replace advice given to you by your health care provider. Make sure you discuss any questions you have with your health care provider. Document Released: 05/06/2005 Document Revised: 12/23/2015 Document Reviewed: 12/23/2015 Elsevier Interactive Patient Education  2018 [employer].

## 2016-11-26 NOTE — Progress Notes (Signed)
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 Today is day 1 cycle 2 of 8 planned cycles of CMF chemotherapy to be followed by definitive surgery.  Since her last visit here she underwent biopsy of an suspicious area in the left breast, 11/18/2016, and this showed ([id number]) atypical ductal hyperplasia.    REVIEW OF SYSTEMS: Jasmine Hood is feeling quite a lot better". She is doing a little bit of walking. She never developed a fever, thankfully. She never had much nausea except when she took Compazine and she said that made her nauseated. The big issue she had was mouth sores, and we treated that with Diflucan and Valtrex. She also did mouth rinses and that is taking care of the problem. She had a rash both vaginally and also on the skin. She used triamcinolone cream for that but that problem also has resolved. Right now she is feeling "back to baseline" and ready to "do this all over again". A detailed review of systems today was otherwise stable    BREAST CANCER HISTORY: From the original intake note  Jasmine Hood herself noted a change in her right breast sometime in September 2017.Marland Kitchen She did not immediately bring it to  medical attention. When she noted some significant changes in her right nipple she saw [name] and was set up for bilateral diagnostic mammography with tomography and [facility]. This found the breast density to be category B. In the upper and lower outer quadrants of the right breast there was a mass measuring at least 10 cm. There were also groups of heterogeneous calcifications measuring 3 cm and a separate group 0.4 cm. On physical exam there was a palpable firm mass measuring approximately 10 cm involving the upper outer and lower outer quadrant of the right breast, with skin reaction and nipple retraction. There was no palpable right axillary adenopathy.  Right breast ultrasonography confirmed a large hypoechoic mass with irregular margins extending to the skin surface. The right axilla showed 2 lymph nodes which appeared abnormal.  In the left breast there were some indeterminate retroareolar calcifications which were felt to warrant biopsy. This was performed 10/08/2016 and showed a fibroadenoma ([id number] [id number]).  Biopsy of the right breast mass at the 7:00 and 10:00 positions as well as one of the suspicious lymph nodes all showed invasive ductal carcinoma, E-cadherin positive. Separate prognostic profiles from the 2 breast masses were sent. Estrogen receptor was positive at 95-70%, progesterone receptor was positive at 15-85%, all with strong staining intensity, the MIB-1 was 15-20%, and HER-2 was nonamplified, the signals ratio being 1.28-1.72 and the number per cell 1.85-3.95.  The patient's subsequent history is as detailed below   PAST MEDICAL HISTORY: Past Medical History:  Diagnosis Date  . Breast cancer ([place]) 10/07/2016   bilateral breast   .  Brown recluse spider bite    right leg  . COLONIC POLYPS, HX OF 01/05/2009   Qualifier: Diagnosis of  By: [name]  MD, [name]   . Complication of anesthesia    slow to awaken after  wisdom  teeth extraction age 27  . HYPOTHYROIDISM 02/01/2010   Qualifier: Diagnosis of  By: [name]  MD, [name]   . Morbid obesity ([place]) 01/05/2009   Qualifier: Diagnosis of  By: [name]  MD, [name]   . Pneumonia 1987  . Sleep apnea    no cpap used     PAST SURGICAL HISTORY: Past Surgical History:  Procedure Laterality Date  . BREAST BIOPSY Right 10/07/2016   invasive mammary carcinoma  . BREAST BIOPSY Left 10/08/2016   left  breast fibroadenoma no malignancy  . CATARACT EXTRACTION Bilateral   . COLONOSCOPY WITH PROPOFOL N/A 12/22/2015   Procedure: COLONOSCOPY WITH PROPOFOL;  Surgeon: [name], MD;  Location: [facility];  Service: Endoscopy;  Laterality: N/A;  . colonscopy  2012   with polyp removed  . MOLE REMOVAL  10/29/2016   Procedure: MOLE REMOVAL LEFT CHEST;  Surgeon: Alphonsa Overall, MD;  Location: [facility];  Service: General;;  . PORTACATH PLACEMENT N/A 10/29/2016   Procedure: INSERTION PORT-A-CATH;  Surgeon: Alphonsa Overall, MD;  Location: [facility];  Service: General;  Laterality: N/A;  . surgery for spider bite  1998  . TUBAL LIGATION    . WISDOM TOOTH EXTRACTION  age 17    FAMILY HISTORY Family History  Problem Relation Age of Onset  . Breast cancer Mother 5       d.52 from metastatic disease  . Throat cancer Father        d.65 history of smoking  . Rheum arthritis Sister        3 sisters pos. for osteo and RA  . Diabetes Maternal Aunt   . Cancer Maternal Grandfather        mouth cancer-chewed tobacco  . Breast cancer Cousin 35  . Breast cancer Daughter 59  . Stomach cancer Sister 13  The patient's father died at the age of 44 from laryngeal cancer in the setting of tobacco abuse. The patient's mother died at the age of 29 from metastatic breast cancer which had been diagnosed in her early 41s. The patient has 2 half-sisters and one half-brother. One of her sisters has "stomach" cancer. There is also a cousin on the maternal side with breast cancer  diagnosed at age 73. No family member has been genetically tested yet  GYNECOLOGIC HISTORY:  No LMP recorded. Patient is postmenopausal. Menarche age 73, first live birth age 61, she is [place] P5. She is status post bilateral tubal ligation. She stopped having periods in 1985. She tells me she did not use hormone replacement. However a note from her bone density scan December 2001 states "this patient began hormone replacement therapy 3 months ago"  SOCIAL HISTORY:  She is originally from [place]. 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 She has survived 2 husband's period at home currently is just she and her daughter Jasmine Hood. She tells me this daughter had mild brain damage at birth and is not working, and cannot drive because of a history of seizures. However she is the one she is planning to name is her healthcare power of attorney. The patient has 13 grandchildren and 4 great-grandchildren. She attends a [employer] DIRECTIVES: At the  10/21/2016 visit the patient was given the appropriate documents to complete and notarize at her discretion   HEALTH MAINTENANCE: Social History  Substance Use Topics  . Smoking status: Never Smoker  . Smokeless tobacco: Never Used  . Alcohol use No     Colonoscopy: 12/22/2015/[facility]  PAP:  Bone density: December 2001 was normal   Allergies  Allergen Reactions  . Bee Venom Anaphylaxis    Current Outpatient Prescriptions  Medication Sig Dispense Refill  . acetaminophen (TYLENOL) 500 MG tablet Take 500 mg by mouth daily as needed for moderate pain or headache.    . ciprofloxacin (CIPRO) 500 MG tablet Take 1 tablet (500 mg total) by mouth 2 (two) times daily. 14 tablet 0  . dexamethasone (DECADRON) 4 MG tablet Take 2 tablets (8 mg total) by mouth daily. Start the day after chemotherapy for 2 days. Take with food. 30 tablet 1  . EPINEPHrine (EPIPEN 2-PAK) 0.3 mg/0.3 mL SOAJ injection Inject 0.3 mLs (0.3 mg  total) into the muscle once. (Patient not taking: Reported on 11/12/2016) 1 Device 2  . fluconazole (DIFLUCAN) 200 MG tablet Take 1 tablet (200 mg total) by mouth daily. Take for 10 days following treatment, every 21 days 10 tablet 5  . HYDROcodone-acetaminophen (NORCO/VICODIN) 5-325 MG tablet Take 1-2 tablets by mouth every 6 (six) hours as needed for moderate pain. (Patient not taking: Reported on 11/12/2016) 20 tablet 0  . lidocaine-prilocaine (EMLA) cream Apply 1 application topically as needed. 30 g 0  . magic mouthwash w/lidocaine SOLN Take 5 mLs by mouth 4 (four) times daily as needed for mouth pain. 240 mL 0  . prochlorperazine (COMPAZINE) 10 MG tablet Take 1 tablet (10 mg total) by mouth every 6 (six) hours as needed (Nausea or vomiting). (Patient not taking: Reported on 11/12/2016) 30 tablet 1  . triamcinolone ointment (KENALOG) 0.5 % Apply 1 application topically 2 (two) times daily. 30 g 0  . valACYclovir (VALTREX) 500 MG tablet Take 1 tablet (500 mg total) by mouth daily. 30 tablet 5   No current facility-administered medications for this visit.     OBJECTIVE: Morbidly obese white woman who appears stated age  74:   11/26/16 0811  BP: (!) 151/75  Pulse: 88  Resp: 18  Temp: 98 F (36.7 C)     Body mass index is 47.48 kg/m.    ECOG FS:1 - Symptomatic but completely ambulatory   Sclerae unicteric, EOMs intact Oropharynx clear and moist No cervical or supraclavicular adenopathy Lungs no rales or rhonchi Heart regular rate and rhythm Abd soft, nontender, positive bowel sounds MSK no focal spinal tenderness, no upper extremity lymphedema Neuro: nonfocal, well oriented, appropriate affect Breasts: Deferred   Photo right breast 10/21/2016  Photo right breast 11/12/2016--underneath     LAB RESULTS:  CMP     Component Value Date/Time   NA 140 11/19/2016 1023   K 3.4 (L) 11/19/2016 1023   CL 107 04/28/2015 1133   CO2 25 11/19/2016 1023   GLUCOSE 177 (H) 11/19/2016  1023   BUN 21.1 11/19/2016 1023   CREATININE 1.2 (H) 11/19/2016 1023   CALCIUM 9.0 11/19/2016 1023   PROT 6.8 11/19/2016 1023   ALBUMIN 2.8 (L) 11/19/2016 1023   AST 29 11/19/2016 1023   ALT 40 11/19/2016 1023   ALKPHOS 92 11/19/2016 1023   BILITOT 0.93 11/19/2016 1023   GFRNONAA 102.34 01/25/2010 0858   GFRAA  11/24/2008 1315    >60        The  eGFR has been calculated using the MDRD equation. This calculation has not been validated in all clinical situations. eGFR's persistently <60 mL/min signify possible Chronic Kidney Disease.    No results found for: TOTALPROTELP, ALBUMINELP, A1GS, A2GS, BETS, BETA2SER, GAMS, MSPIKE, SPEI  No results found for: KPAFRELGTCHN, LAMBDASER, [facility]  Lab Results  Component Value Date   WBC 1.6 (L) 11/19/2016   NEUTROABS 0.2 (LL) 11/19/2016   HGB 12.1 11/19/2016   HCT 35.0 11/19/2016   MCV 96.2 11/19/2016   PLT 95 (L) 11/19/2016      Chemistry      Component Value Date/Time   NA 140 11/19/2016 1023   K 3.4 (L) 11/19/2016 1023   CL 107 04/28/2015 1133   CO2 25 11/19/2016 1023   BUN 21.1 11/19/2016 1023   CREATININE 1.2 (H) 11/19/2016 1023      Component Value Date/Time   CALCIUM 9.0 11/19/2016 1023   ALKPHOS 92 11/19/2016 1023   AST 29 11/19/2016 1023   ALT 40 11/19/2016 1023   BILITOT 0.93 11/19/2016 1023       No results found for: LABCA2  No components found for: BHALPF790  No results for input(s): INR in the last 168 hours.  Urinalysis    Component Value Date/Time   COLORURINE LT. YELLOW 01/25/2010 0858   APPEARANCEUR CLEAR 01/25/2010 0858   LABSPEC >=1.030 01/25/2010 0858   PHURINE 5.5 01/25/2010 0858   GLUCOSEU NEGATIVE 01/25/2010 0858   HGBUR 1+ 12/30/2008 0838   BILIRUBINUR Neg 04/28/2015 1122   KETONESUR 15 (?) 01/25/2010 0858   PROTEINUR Neg 04/28/2015 1122   PROTEINUR NEGATIVE 11/24/2008 1545   UROBILINOGEN 0.2 04/28/2015 1122   UROBILINOGEN 0.2 01/25/2010 0858   NITRITE Neg 04/28/2015 1122    NITRITE NEGATIVE 01/25/2010 0858   LEUKOCYTESUR small (1+) (A) 04/28/2015 1122     STUDIES: Ct Chest W Contrast  Result Date: 10/30/2016 CLINICAL DATA:  History of right breast cancer initially diagnosed in April 2018. Pre chemotherapy. EXAM: CT CHEST WITH CONTRAST TECHNIQUE: Multidetector CT imaging of the chest was performed during intravenous contrast administration. CONTRAST:  80m ISOVUE-300 IOPAMIDOL (ISOVUE-300) INJECTION 61% COMPARISON:  None. FINDINGS: Cardiovascular: The heart is normal in size. No pericardial effusion. Normal caliber thoracic aorta. Minimal scattered atherosclerotic calcifications. No dissection. The branch vessels are patent. Minimal scattered coronary artery calcifications. Mediastinum/Nodes: Scattered mediastinal lymph nodes are indeterminate. 9 mm superior mediastinal node on image number 39 near the SVC. 8 mm precarinal lymph node on image number 51. 9.5 mm aorticopulmonary window lymph node on image number 51. 10.5 mm subcarinal lymph node on image number 64. The esophagus is grossly normal. Lungs/Pleura: No acute pulmonary findings. No pulmonary nodules to suggest pulmonary metastatic disease. Minimal dependent bibasilar atelectasis. No pleural effusion. Upper Abdomen: No significant upper abdominal findings. Suspect mild cirrhotic changes involving the liver. Chest wall/ Musculoskeletal: 4 cm irregular mass noted in the right breast consistent with patient's known breast cancer. There are enlarged right axillary lymph nodes consistent with metastatic disease. The largest node measures 13.5 mm short axis on image number 40. There is also a 10.5 mm lymph node on image number 48. No supraclavicular adenopathy. A left-sided Port-A-Cath is noted. No left-sided breast lesions or left axillary adenopathy. Mixed lytic and sclerotic appearance of the T6 vertebral body is very suspicious for a metastatic lesion. No other obvious lesions but recommend correlation with ordered nuclear  medicine whole body bone scan. IMPRESSION: 1. 4 cm right breast mass and enlarged right axillary lymph  nodes consistent with metastatic adenopathy. 2. Borderline mediastinal lymph nodes. No findings for pulmonary metastatic disease. 3. Worrisome T6 bone lesion suspicious for metastasis. Recommend correlation with ordered bone scan. 4. No CT findings for upper abdominal metastatic disease. 5. Suspect cirrhotic changes involving the liver. Aortic Atherosclerosis (ICD10-I70.0). Electronically Signed   By: Marijo Sanes M.D.   On: 10/30/2016 13:50   Nm Bone Scan Whole Body  Result Date: 10/30/2016 CLINICAL DATA:  History breast malignancy. No skeletal symptoms or history of trauma. EXAM: NUCLEAR MEDICINE WHOLE BODY BONE SCAN TECHNIQUE: Whole body anterior and posterior images were obtained approximately 3 hours after intravenous injection of radiopharmaceutical. RADIOPHARMACEUTICALS:  21.8 mCi Technetium-37mMDP IV COMPARISON:  No previous bone scans in PACs FINDINGS: There is adequate uptake of the radiopharmaceutical by the skeleton. There is adequate soft tissue clearance and renal activity. Uptake within the calvarium is normal. There is increased uptake in the upper and mid and lower thoracic spine as well as at L5-S1. No definite abnormal rib uptake is observed. Uptake in the pelvis appears normal. No significant upper or lower extremity uptake is observed. Uptake in the posterior aspect of the right calcaneus is likely degenerative in nature. IMPRESSION: Increased uptake within the thoracic spine which may reflect metastatic disease. The upper thoracic focus may correspond to the sclerotic region of T5 on today's CT scan. The increased uptake at approximately T9 and T12 does not clearly correspond to CT abnormalities but is worrisome for early metastatic disease as well. Electronically Signed   By: David  JMartiniqueM.D.   On: 10/30/2016 15:02   Dg Chest Port 1 View  Result Date: 10/29/2016 CLINICAL DATA:   Post-op Port-a-cath placement. EXAM: PORTABLE CHEST 1 VIEW COMPARISON:  None. FINDINGS: Left anterior chest wall Port-A-Cath extends through the left subclavian vein. Tip projects in the lower superior vena cava just above the caval atrial junction. No pneumothorax. Cardiac silhouette is normal in size. No mediastinal or hilar masses or evidence of adenopathy. Clear lungs. IMPRESSION: Left anterior chest wall Port-A-Cath is well positioned, tip lying in the lower superior vena cava just above the caval atrial junction. No pneumothorax. No acute cardiopulmonary disease. Electronically Signed   By: DLajean ManesM.D.   On: 10/29/2016 13:01   Dg C-arm 1-60 Min-no Report  Result Date: 10/29/2016 Fluoroscopy was utilized by the requesting physician.  No radiographic interpretation.   Mr Total Spine Mets Screening  Result Date: 11/11/2016 CLINICAL DATA:  Stage IV metastatic breast cancer. Evaluate spinal metastatic disease. EXAM: MRI TOTAL SPINE WITHOUT AND WITH CONTRAST TECHNIQUE: Multisequence MR imaging of the spine from the cervical spine to the sacrum was performed prior to and following IV contrast administration for evaluation of spinal metastatic disease. CONTRAST:  259mMULTIHANCE GADOBENATE DIMEGLUMINE 529 MG/ML IV SOLN COMPARISON:  Nuclear medicine study 10/30/2016.  CT 10/30/2016. FINDINGS: MRI CERVICAL SPINE FINDINGS Alignment: Normal Vertebrae: No metastatic disease at C5 or above. Metastatic disease affecting the C6 and C7 vertebral bodies with involvement of the posterior elements at C6 on the left and C7 on the right. No evidence of fracture or extraosseous tumor extension. Cord: No cord compression or cord metastasis. Posterior Fossa, vertebral arteries, paraspinal tissues: Negative as incompletely evaluated. Disc levels: Mild non-compressive spondylosis at C5-6. MRI THORACIC SPINE FINDINGS Alignment:  Normal Vertebrae: Extensive metastatic disease throughout the thoracic region affecting all  vertebral body levels. Most extensive at T1, T2, T5, T6, T9, T10, T11 and T12. No extraosseous tumor extension. Cord: Tiny enhancing focus  along the dorsal aspect of the cord at T11-12. This could be a tiny surface metastasis or could be a vascular structure. Paraspinal and other soft tissues: No extraosseous tumor identified. Metastatic disease is also noted within the sternum. Disc levels: Ordinary spondylosis at T8-9.  Other levels are unremarkable. MRI LUMBAR SPINE FINDINGS Segmentation:  5 lumbar type vertebral bodies Alignment:  Normal Vertebrae: Extensive metastatic disease throughout the region, with relatively less involvement of L1. Extensive involvement also of the upper sacrum. Minor superior endplate fracture at L5 with loss of height of 10%. This could be recent related to bone weakening from the metastatic disease. No evidence of extraosseous tumor extension in the region. Conus medullaris: Extends to the L2 level and appears normal. Paraspinal and other soft tissues: Otherwise negative Disc levels: Mild disc bulge at L4-5 without stenosis or neural compression. Mild lower lumbar facet osteoarthritis. IMPRESSION: Bony metastatic disease beginning at C6 and extending through the sacrum. Involvement of every vertebral body level throughout that region. No extraosseous tumor extension or nerve compression secondary to tumor. Question minimal superior endplate fracture at L5 possibly secondary to the metastatic disease. It is also possible this could be an older fracture. Tiny focus of enhancement along the dorsal cord at T11-12. This could be a tiny surface metastasis or a vascular structure. This appears to be along the surface of the cord rather than within the substance. Electronically Signed   By: Nelson Chimes M.D.   On: 11/11/2016 11:33   Mm Clip Placement Left  Result Date: 11/18/2016 CLINICAL DATA:  Status post MRI guided left breast biopsy today. EXAM: DIAGNOSTIC LEFT MAMMOGRAM POST MRI BIOPSY  COMPARISON:  Previous exam(s). FINDINGS: Mammographic images were obtained following MRI guided biopsy of suspicious non mass enhancement within the upper-outer quadrant (12-1 o'clock axis region). At the conclusion of the procedure, a barbell shaped clip was deployed into the biopsy cavity. Barbell shaped clip is displaced approximately 4 cm medially. IMPRESSION: Status post MRI guided left breast biopsy of suspicious non mass enhancement within the upper-outer quadrant (12-1 o'clock axis). Barbell shaped biopsy clip is displaced approximately 4 cm medial to the biopsy site. Final Assessment: Post Procedure Mammograms for Marker Placement Electronically Signed   By: Franki Cabot M.D.   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 This was found to be concordant by Dr. Franki Cabot, with excision recommended. Excision, at minimum, is recommended as MRI findings suggest a high likelihood for pathologic upgrade at surgical excision. Pathology results were discussed with the patient by telephone. The patient reported doing well after the biopsy with tenderness at the site. Post biopsy instructions and care were reviewed and questions were answered. The patient was encouraged to call [facility] for any additional concerns. The patient has a recent diagnosis of right breast cancer and should follow her outlined treatment plan. Pathology results reported by Terie Purser, RN on 11/21/2016. Electronically Signed   By: Franki Cabot M.D.   On: 11/21/2016 13:36   Result Date: 11/21/2016 CLINICAL DATA:  Patient with recent biopsy-proven right breast cancer. Subsequent breast MRI showing suspicious non-mass enhancement within the left breast for which patient presents today for MRI guided biopsy.  EXAM: MRI GUIDED CORE NEEDLE BIOPSY OF THE LEFT BREAST TECHNIQUE: Multiplanar, multisequence MR  imaging of the left breast was performed without intravenous contrast. CONTRAST:  None.  No IV access. COMPARISON:  Previous exams. FINDINGS: I met with the patient, and we discussed the procedure of MRI guided biopsy, including risks, benefits, and alternatives. Specifically, we discussed the risks of infection, bleeding, tissue injury, clip migration, and inadequate sampling. Informed, written consent was given. The usual time out protocol was performed immediately prior to the procedure. Using sterile technique, 1% Lidocaine, MRI guidance, and a 9 gauge vacuum assisted device, biopsy was performed of the suspicious non-mass enhancement within the upper-outer quadrant of the left breast using a lateral approach. At the conclusion of the procedure, a tissue marker clip was deployed into the biopsy cavity. Follow-up 2-view mammogram was performed and dictated separately. IMPRESSION: MRI guided biopsy of the non-mass enhancement in the upper-outer quadrant of the left breast. No apparent complications. Electronically Signed: By: Franki Cabot M.D. On: 11/18/2016 08:57    ELIGIBLE FOR AVAILABLE RESEARCH PROTOCOL: no  ASSESSMENT: 71 y.o. 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 There was a 4 cm right breast mass with right axillary lymph nodes and evidence of  cirrhosis.  (3) neoadjuvant treatment to consist of cyclophosphamide, methotrexate, and fluorouracil (CMF) chemotherapy every 21 days 8, starting 11/05/2016  (4) definitive surgery to follow  (5) adjuvant radiation to follow surgery  (6) anti-estrogen therapy to follow at the completion of local treatment  PLAN: I spent approximately 30 minutes with Jasmine Hood and her daughter Jasmine Hood going over her loss situation. We spent a good deal of time going over her medications and I updated her list. She was concerned that the wrong pharmacy had been entered so I made sure there was only 1 pharmacy listed for which is the one in [place].  She understands that she is to continue to take the Valtrex daily for the next several months. She takes Diflucan beginning today or tomorrow and continues that for total of 10 days and we'll repeat that with every cycle of chemotherapy.  She will start the Cipro when she sees [place] again in 1 week. She will take those for 5 days. I have given her all those refills and we will print her a corrected medication list today.  I have added Phenergan suppositories for nausea control. She did very poorly with the Compazine and we have eliminated that.  Overall I think she tolerated her first cycle generally well except for the mouth sores and we are aggressively addressing that. The second concern is her neutrophil count which was 200 at nadir. We will follow-up on that this coming week and if it is again very low we will consider adding on Pro to her treatments.  I did let the daughter know that Jasmine Hood's breast cancer has spread to her bones. We reviewed the overall treatment plan which is for 8 cycles of CMF followed by surgery, radiation and anti-estrogens.  She already has an appointment in one week. She knows to call for any problems that may develop before then.  Jhayden Demuro C,  MD   11/26/2016 8:17 AM Medical Oncology and Hematology [facility] [address] [place], [place] [address] Tel. [phone]    Fax. Wilson, MD Medical Oncology and Hematology [facility] [address] [place], [place] [address] Tel. [phone]    Fax. [phone]

## 2016-11-26 NOTE — Progress Notes (Signed)
Received PA request for Promethazine HCL.  Submitted request on Cover My Meds.  Jasmine Hood (Key: [id number]) [id number]  Need help? Call us at [phone]   Status  Sent to [employer]  Next Steps  The plan will fax you a determination, typically within 1 to 5 business days. How do I follow up?  DrugPromethazine HCl 25MG  suppositories  FormAetna Medicare Coverage Determination FormDO NOT USE FOR COVENTRY OR AETNA COMMERCIAL MEMBERS. Prescription Drug Coverage Determination for AETNA MEDICARE members [phone]  Original Claim [id number] PR REQ FOR AGE 73 CALL [phone] REQUIRES PRIOR AUTHORIZATION

## 2016-11-26 NOTE — Progress Notes (Signed)
Received PA approval via fax from [facility] for Promethazine HCL.  PA approved 05/18/16-05/19/17.  Called [facility]([place]) to advise of approval. He states ok and they would get it ready for the patient.

## 2016-11-26 NOTE — Patient Instructions (Signed)
Implanted Port Home Guide An implanted port is a type of central line that is placed under the skin. Central lines are used to provide IV access when treatment or nutrition needs to be given through a person's veins. Implanted ports are used for long-term IV access. An implanted port may be placed because:  You need IV medicine that would be irritating to the small veins in your hands or arms.  You need long-term IV medicines, such as antibiotics.  You need IV nutrition for a long period.  You need frequent blood draws for lab tests.  You need dialysis.  Implanted ports are usually placed in the chest area, but they can also be placed in the upper arm, the abdomen, or the leg. An implanted port has two main parts:  Reservoir. The reservoir is round and will appear as a small, raised area under your skin. The reservoir is the part where a needle is inserted to give medicines or draw blood.  Catheter. The catheter is a thin, flexible tube that extends from the reservoir. The catheter is placed into a large vein. Medicine that is inserted into the reservoir goes into the catheter and then into the vein.  How will I care for my incision site? Do not get the incision site wet. Bathe or shower as directed by your health care provider. How is my port accessed? Special steps must be taken to access the port:  Before the port is accessed, a numbing cream can be placed on the skin. This helps numb the skin over the port site.  Your health care provider uses a sterile technique to access the port. ? Your health care provider must put on a mask and sterile gloves. ? The skin over your port is cleaned carefully with an antiseptic and allowed to dry. ? The port is gently pinched between sterile gloves, and a needle is inserted into the port.  Only "non-coring" port needles should be used to access the port. Once the port is accessed, a blood return should be checked. This helps ensure that the port  is in the vein and is not clogged.  If your port needs to remain accessed for a constant infusion, a clear (transparent) bandage will be placed over the needle site. The bandage and needle will need to be changed every week, or as directed by your health care provider.  Keep the bandage covering the needle clean and dry. Do not get it wet. Follow your health care provider's instructions on how to take a shower or bath while the port is accessed.  If your port does not need to stay accessed, no bandage is needed over the port.  What is flushing? Flushing helps keep the port from getting clogged. Follow your health care provider's instructions on how and when to flush the port. Ports are usually flushed with saline solution or a medicine called heparin. The need for flushing will depend on how the port is used.  If the port is used for intermittent medicines or blood draws, the port will need to be flushed: ? After medicines have been given. ? After blood has been drawn. ? As part of routine maintenance.  If a constant infusion is running, the port may not need to be flushed.  How long will my port stay implanted? The port can stay in for as long as your health care provider thinks it is needed. When it is time for the port to come out, surgery will be   done to remove it. The procedure is similar to the one performed when the port was put in. When should I seek immediate medical care? When you have an implanted port, you should seek immediate medical care if:  You notice a bad smell coming from the incision site.  You have swelling, redness, or drainage at the incision site.  You have more swelling or pain at the port site or the surrounding area.  You have a fever that is not controlled with medicine.  This information is not intended to replace advice given to you by your health care provider. Make sure you discuss any questions you have with your health care provider. Document  Released: 05/06/2005 Document Revised: 10/12/2015 Document Reviewed: 01/11/2013 Elsevier Interactive Patient Education  2017 Elsevier Inc.  

## 2016-11-27 ENCOUNTER — Ambulatory Visit: Payer: Self-pay | Admitting: Genetics

## 2016-11-27 ENCOUNTER — Encounter: Payer: Self-pay | Admitting: Genetics

## 2016-11-27 ENCOUNTER — Telehealth: Payer: Self-pay | Admitting: Genetics

## 2016-11-27 DIAGNOSIS — Z1379 Encounter for other screening for genetic and chromosomal anomalies: Secondary | ICD-10-CM

## 2016-11-27 HISTORY — DX: Encounter for other screening for genetic and chromosomal anomalies: Z13.79

## 2016-11-27 NOTE — Telephone Encounter (Signed)
Reviewed that germline genetic testing revealed no pathogenic mutations. This is considered to be a negative result. Testing was performed through [facility]'s 46-gene Common Hereditary Cancers Panel. [facility]'s Common Hereditary Cancers Panel includes analysis of the following 46 genes: APC, ATM, AXIN2, BARD1, BMPR1A, BRCA1, BRCA2, BRIP1, CDH1, CDKN2A, CHEK2, CTNNA1, DICER1, EPCAM, GREM1, HOXB13, KIT, MEN1, MLH1, MSH2, MSH3, MSH6, MUTYH, NBN, NF1, NTHL1, PALB2, PDGFRA, PMS2, POLD1, POLE, PTEN, RAD50, RAD51C, RAD51D, SDHA, SDHB, SDHC, SDHD, SMAD4, SMARCA4, STK11, TP53, TSC1, TSC2, and VHL.  A variant of uncertain significance (VUS) was noted in CTNNA1. The specific CTNNA1 variant is B.169+4H>W (splice donor). Discussed that this VUS should not change clinical management.  For more detailed discussion, please see genetic counseling documentation from 11/27/2016. Result report dated 11/26/2016.

## 2016-11-27 NOTE — Progress Notes (Signed)
HPI: Ms. Sara was previously seen in the [place] clinic on 11/19/2016 due to a personal and family history of cancer and concerns regarding a hereditary predisposition to cancer. Please refer to our prior cancer genetics clinic note for more information regarding Ms. Pottle's medical, social and family histories, and our assessment and recommendations, at the time. Ms. Shults recent genetic test results were disclosed to her, as were recommendations warranted by these results. These results and recommendations are discussed in more detail below.  CANCER HISTORY: In May 2018, at the age of 71, Ms. 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 She is currently undergoing neoadjuvant chemotherapy. She reports a history of approximately 3 colon polyps over her lifetime.    Malignant neoplasm of overlapping sites of right breast in female, estrogen receptor positive ([place])   10/21/2016 Initial Diagnosis    Malignant neoplasm of overlapping sites of right breast in female, estrogen receptor positive ([facility])     11/19/2016 Genetic Testing    Genetic counseling and testing for hereditary cancer syndromes performed on 11/19/2016. Results are negative for pathogenic mutations in 46 genes analyzed by [facility]'s Common Hereditary Cancers Panel. Results are dated 11/26/2016. Genes tested: APC, ATM, AXIN2, BARD1, BMPR1A, BRCA1, BRCA2, BRIP1, CDH1, CDKN2A, CHEK2, CTNNA1, DICER1, EPCAM, GREM1, HOXB13, KIT, MEN1, MLH1, MSH2, MSH3, MSH6, MUTYH, NBN, NF1, NTHL1, PALB2, PDGFRA, PMS2, POLD1, POLE, PTEN, RAD50, RAD51C, RAD51D, SDHA, SDHB, SDHC, SDHD, SMAD4, SMARCA4, STK11, TP53, TSC1, TSC2, and VHL.  A variant of uncertain significance (not clinically actionable) was noted in CTNNA1.           FAMILY HISTORY:  We obtained a detailed, 4-generation family history.  Significant diagnoses are listed below: Family History  Problem Relation Age of Onset  . Breast  cancer Mother 36       d.52 from metastatic disease  . Throat cancer Father        d.65 history of smoking  . Rheum arthritis Sister        3 sisters pos. for osteo and RA  . Diabetes Maternal Aunt   . Cancer Maternal Grandfather        mouth cancer-chewed tobacco  . Breast cancer Cousin 73  . Breast cancer Daughter 71  . Stomach cancer Sister 21   Ms. Foor has three full sisters, ages 41, 78, and 46. The 32 year old sister, "[name]", has a reported history of stomach cancer in her 22s. Ms. Schlee has no further details about Lucky's diagnosis, treatment, or current health. Ms. Guadiana has one maternal half-sister, [name], who is 17 without cancers. Ms. Downum also has two paternal half-brothers, [name] and name unknown, and one paternal half-sister, [name]. To her knowledge, Ms. Delpozo's paternal half-siblings do not have a history of cancer, though information is limited.   Ms. Redditt has two sons and three daughters. Her sons are 49 and 55 and have no cancers. Her daughters are 12, 10, and 8. The 31 year old daughter, Donella Stade, was diagnosed with breast cancer last year and is reported to be doing well. The 49 year old daughter, [name], is currently undergoing work-up for a lump in her breast. It is unclear if this is cancer.  Ms. Wahid mother died from metastatic breast cancer at age 74. Ms. Burkemper reports that her mother was around the age of 77 at the time of her initial diagnosis. Ms. Pavia had two maternal aunts and two maternal uncles. All are deceased and did not have cancer. However, her  uncle Bill's daughter died with breast cancer. Ms. Branaman maternal grandfather died with mouth cancer. He chewed tobacco. Ms. Longo maternal grandmother did not have cancer.  Ms. Basham father died at age 42 and had a history of throat/laryngeal cancer. He had a history of smoking. Ms. Hamza had one paternal aunt who died in her 68s from an infected spider bite. Ms. Mcginniss has no paternal  cousins. Neither paternal grandparent was known to have cancer.  Ms. Scotti is unaware of previous family history of genetic testing for hereditary cancer risks. Patient's maternal ancestors are of "[place]" and Jewish descent, and paternal ancestors are of "[place]" descent. There is no reported Ashkenazi Jewish ancestry. There is no known consanguinity.  GENETIC TEST RESULTS: Genetic testing performed through [facility]'s Common Hereditary Cancer Panel reported out on 11/26/2016 showed no pathogenic mutations. [facility] Common Hereditary Cancers Panel includes analysis of the following 46 genes: APC, ATM, AXIN2, BARD1, BMPR1A, BRCA1, BRCA2, BRIP1, CDH1, CDKN2A, CHEK2, CTNNA1, DICER1, EPCAM, GREM1, HOXB13, KIT, MEN1, MLH1, MSH2, MSH3, MSH6, MUTYH, NBN, NF1, NTHL1, PALB2, PDGFRA, PMS2, POLD1, POLE, PTEN, RAD50, RAD51C, RAD51D, SDHA, SDHB, SDHC, SDHD, SMAD4, SMARCA4, STK11, TP53, TSC1, TSC2, and VHL.  A variant of uncertain significance (VUS) called CTNNA1 O.709+6G>E (splice donor) was also noted. At this time, it is unknown if this variant is associated with increased cancer risk or if this is a normal finding, but most variants such as this get reclassified to being inconsequential. It should not be used to make medical management decisions. With time, we suspect the lab will determine the significance of this variant, if any. If we do learn more about it, we will try to contact Ms. Simerson to discuss it further. However, it is important to stay in touch with [place] periodically and keep the address and phone number up to date.  The test report will be scanned into EPIC and will be located under the Molecular Pathology section of the Results Review tab.A portion of the result report is included below for reference.     We discussed with Ms. Sittner that since the current genetic testing is not perfect, it is possible there may be a gene mutation in one of these genes that current testing cannot  detect, but that chance is small. We also discussed, that it is possible that another gene that has not yet been discovered, or that we have not yet tested, is responsible for the cancer diagnoses in the family. Therefore, important to remain in touch with cancer genetics in the future so that we can continue to offer Ms. Deetz the most up to date genetic testing.   CANCER SCREENING RECOMMENDATIONS: This result indicates that it is unlikely Ms. Peachey has an increased risk for a future cancer due to a mutation in one of these genes. Given Ms. Meditz's personal and family histories, we must interpret these negative results with some caution.  Families with features suggestive of hereditary risk for cancer tend to have multiple family members with cancer, diagnoses in multiple generations and diagnoses before the age of 86. Ms. Farinas's family exhibits some of these features. Thus this result may simply reflect our current inability to detect all mutations within these genes or there may be a different gene that has not yet been discovered or tested. Because no causative or actionable mutations were identified, it is recommended she continue to follow the cancer management and screening guidelines provided by her oncology and primary healthcare providers.   RECOMMENDATIONS FOR FAMILY MEMBERS:  Women in this family might be at some increased risk of developing breast cancer, over the general population risk, simply due to the family history of cancer. We recommended women in this family have a yearly mammogram beginning at age 13, or 48 years younger than the earliest onset of cancer, an annual clinical breast exam, and perform monthly breast self-exams. Women in this family should also discuss their family history of breast cancer with their physicians to determine whether high-risk breast cancer screenings are indicated. Women in this family should also have a gynecological exam as recommended by their primary  provider. All family members should have a colonoscopy by age 40.  Based on Ms. Kostka's family history, we recommended her daughter, who was diagnosed with breast at age 51, have genetic counseling and testing. Furthermore, it appears that each of Ms. Mcglade's full siblings and maternal half-sibling are candidates for genetic testing due to her mother's history of early-onset breast cancer. Ms. Shenefield will let us know if we can be of any assistance in coordinating genetic counseling and/or testing for her family members.   FOLLOW-UP: Lastly, we discussed with Ms. Bartelson that cancer genetics is a rapidly advancing field and it is possible that new genetic tests will be appropriate for her and/or her family members in the future. We encouraged her to remain in contact with cancer genetics on an annual basis so we can update her personal and family histories and let her know of advances in cancer genetics that may benefit this family.   Our contact number was provided. Ms. Brines questions were answered to her satisfaction, and she knows she is welcome to call us at anytime with additional questions or concerns.   Mal Misty, MS, [facility] Certified Naval [email or web address]

## 2016-11-29 ENCOUNTER — Telehealth: Payer: Self-pay | Admitting: Genetics

## 2016-11-29 NOTE — Telephone Encounter (Signed)
Copy of genetic testing results were mailed to home address on file on 11/29/2016.

## 2016-12-02 NOTE — Progress Notes (Signed)
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  She is currently receiving neoadjuvant chemotherapy with CMF given on day 1 of a 21 day cycle.  She is on cycle 2 day 8 of treatment today.  She is doing very well.  She is confused about how to take her medications after chemotherapy.  She has continued taking Valtrex every day, but wants to know about the Dexamethasone, Cipro, and Fluconazole.  She however tolerated the second cycle of chemotherapy, much better than the first.     REVIEW OF SYSTEMS: Jasmine Hood denies any fevers, chills, nausea, vomiting, constipation, diarrhea, or mucositis.  She denies peripheral neuropathy.  Her previous skin rashes have resolved.    BREAST CANCER HISTORY: From the original intake note  Jasmine Hood herself noted a change in her right breast sometime in September 2017.Jasmine Hood Kitchen She did not immediately bring it to medical attention. When she noted some significant changes in her right nipple she saw [name] and was set up for bilateral diagnostic mammography with tomography and [facility]. This found the breast density to be category B. In the upper and lower outer  quadrants of the right breast there was a mass measuring at least 10 cm. There were also groups of heterogeneous calcifications measuring 3 cm and a separate group 0.4 cm. On physical exam there was a palpable firm mass measuring approximately 10 cm involving the upper outer and lower outer quadrant of the right breast, with skin reaction and nipple retraction. There was no palpable right axillary adenopathy.  Right breast ultrasonography confirmed a large hypoechoic mass with irregular margins extending to the skin surface. The right axilla showed 2 lymph nodes which appeared abnormal.  In the left breast there were some indeterminate retroareolar calcifications which were felt to warrant biopsy. This was performed 10/08/2016 and showed a fibroadenoma ([id number]).  Biopsy of the right breast mass at the 7:00 and 10:00 positions as well as one of the suspicious lymph nodes all showed invasive ductal carcinoma, E-cadherin positive. Separate prognostic profiles from the 2 breast masses were sent. Estrogen receptor was positive at 95-70%, progesterone receptor was positive at 15-85%, all with strong staining intensity, the MIB-1 was 15-20%, and HER-2 was nonamplified, the signals ratio being 1.28-1.72 and the number per cell 1.85-3.95.  The patient's subsequent history is as detailed below   PAST MEDICAL HISTORY: Past Medical History:  Diagnosis Date  . Breast cancer ([place]) 10/07/2016   bilateral breast   . [place] recluse spider bite    right leg  . COLONIC POLYPS, HX OF 01/05/2009   Qualifier: Diagnosis of  By: [name]  MD, [name]   . Complication of anesthesia    slow to awaken after wisdom  teeth extraction age 19  . Genetic testing 11/27/2016   Ms. [name]  underwent genetic counseling and testing for hereditary cancer syndromes on 11/19/2016. Her results were negative for mutations in all 46 genes analyzed by [facility]'s 46-gene Common Hereditary Cancers Panel. Genes analyzed include: APC,  ATM, AXIN2, BARD1, BMPR1A, BRCA1, BRCA2, BRIP1, CDH1, CDKN2A, CHEK2, CTNNA1, DICER1, EPCAM, GREM1, HOXB13, KIT, MEN1, MLH1, MSH2, MSH3, MSH6, MUTYH, NBN,  . HYPOTHYROIDISM 02/01/2010   Qualifier: Diagnosis of  By: [name]  MD, [name]   . Morbid obesity ([place]) 01/05/2009   Qualifier: Diagnosis of  By: [name]  MD, [name]   . Pneumonia 1987  . Sleep apnea    no cpap used     PAST SURGICAL HISTORY: Past Surgical History:  Procedure Laterality Date  . BREAST BIOPSY Right 10/07/2016   invasive mammary carcinoma  . BREAST BIOPSY Left 10/08/2016   left  breast fibroadenoma no malignancy  . CATARACT EXTRACTION Bilateral   . COLONOSCOPY WITH PROPOFOL N/A 12/22/2015   Procedure: COLONOSCOPY WITH PROPOFOL;  Surgeon: [name], MD;  Location: [facility];  Service: Endoscopy;  Laterality: N/A;  . colonscopy  2012   with polyp removed  . MOLE REMOVAL  10/29/2016   Procedure: MOLE REMOVAL LEFT CHEST;  Surgeon: Alphonsa Overall, MD;  Location: [facility];  Service: General;;  . PORTACATH PLACEMENT N/A 10/29/2016   Procedure: INSERTION PORT-A-CATH;  Surgeon: Alphonsa Overall, MD;  Location: [facility];  Service: General;  Laterality: N/A;  . surgery for spider bite  1998  . TUBAL LIGATION    . WISDOM TOOTH EXTRACTION  age 25    FAMILY HISTORY Family History  Problem Relation Age of Onset  . Breast cancer Mother 48       d.52 from metastatic disease  . Throat cancer Father        d.65 history of smoking  . Rheum arthritis Sister        3 sisters pos. for osteo and RA  . Diabetes Maternal Aunt   . Cancer Maternal Grandfather        mouth cancer-chewed tobacco  . Breast cancer Cousin 20  . Breast cancer Daughter 68  . Stomach cancer Sister 48  The patient's father died at the age of 1 from laryngeal cancer in the setting of tobacco abuse. The patient's mother died at the age of 14 from metastatic breast cancer which had been diagnosed in her early 37s. The patient has 2 half-sisters and  one half-brother. One of her sisters has "stomach" cancer. There is also a cousin on the maternal side with breast cancer diagnosed at age 22. No family member has been genetically tested yet  GYNECOLOGIC HISTORY:  No LMP recorded. Patient is postmenopausal. Menarche age 85, first live birth age 27, she is [place] P5. She is status post bilateral tubal ligation. She stopped having periods in 1985. She tells me she did not use hormone replacement. However a note from her bone density scan December 2001 states "this patient began hormone replacement therapy 3 months ago"  SOCIAL HISTORY:  She is originally from [place]. 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 She has survived 2 husband's period at home currently is just she and her daughter Jasmine Hood. She tells me this daughter had mild brain damage at birth and is not working, and cannot drive because of a history of seizures. However she is the one she is planning to name is her healthcare power of attorney. The patient has 13 grandchildren and 4 great-grandchildren. She attends a [employer]    ADVANCED DIRECTIVES: At the 10/21/2016 visit the patient was given the appropriate documents to complete and notarize at her discretion   HEALTH MAINTENANCE: Social History  Substance Use Topics  . Smoking status: Never Smoker  . Smokeless tobacco: Never Used  . Alcohol use No     Colonoscopy: 12/22/2015/[facility]  PAP:  Bone density: December 2001 was normal   Allergies  Allergen Reactions  . Bee Venom Anaphylaxis    Current Outpatient Prescriptions  Medication Sig Dispense Refill  . ciprofloxacin (CIPRO) 500 MG tablet Take 1 tablet (500 mg total) by mouth 2 (two) times daily. 14 tablet 0  . dexamethasone (DECADRON) 4 MG tablet Take 2 tablets (8 mg total) by mouth daily. Start the day after chemotherapy for 2 days. Take with food. 30 tablet 1  . fluconazole (DIFLUCAN) 100 MG tablet Take 1 tablet (100 mg total) by  mouth daily. Take for 10 days following treatment, every 21 days 70 tablet 1  . lidocaine-prilocaine (EMLA) cream Apply 1 application topically as needed. 30 g 0  . magic mouthwash w/lidocaine SOLN Take 5 mLs by mouth 4 (four) times daily as needed for mouth pain. 240 mL 0  . triamcinolone ointment (KENALOG) 0.5 % Apply 1 application topically 2 (two) times daily. 30 g 0  . valACYclovir (VALTREX) 500 MG tablet Take 1 tablet (500 mg total) by mouth daily. 30 tablet 5  . acetaminophen (TYLENOL) 500 MG tablet Take 500 mg by mouth daily as needed for moderate pain or headache.    Jasmine Hood Kitchen EPINEPHrine (EPIPEN 2-PAK) 0.3 mg/0.3 mL SOAJ injection Inject 0.3 mLs (0.3 mg total) into the muscle once. (Patient not taking: Reported on 11/12/2016) 1 Device 2  . promethazine (PHENERGAN) 25 MG suppository Place 1 suppository (25 mg total) rectally every 6 (six) hours as needed for nausea or vomiting. (Patient not taking: Reported on 12/03/2016) 12 each 4   No current facility-administered medications for this visit.     OBJECTIVE:   Vitals:   12/03/16 0946  BP: (!) 184/59  Pulse: 80  Resp: 16  Temp: 97.7 F (36.5 C)     Body mass index is 47.05 kg/m.    ECOG FS:1 - Symptomatic but completely ambulatory   GENERAL: Patient is a well appearing female in no acute distress HEENT:  Sclerae anicteric.  Oropharynx clear and moist. No ulcerations or evidence of oropharyngeal candidiasis. Neck is supple.  NODES:  No cervical, supraclavicular, or axillary lymphadenopathy palpated.  BREAST EXAM:  Deferred. LUNGS:  Clear to auscultation bilaterally.  No wheezes or rhonchi. HEART:  Regular rate and rhythm. No murmur appreciated. ABDOMEN:  Soft, nontender.  Positive, normoactive bowel sounds. No organomegaly palpated. MSK:  No focal spinal tenderness to palpation. Full range of motion bilaterally in the upper extremities. EXTREMITIES:  No peripheral edema.   SKIN:  Clear with no obvious rashes or skin changes. No nail  dyscrasia. NEURO:  Nonfocal. Well oriented.  Appropriate affect.        LAB RESULTS:  CMP     Component Value Date/Time   NA 140 12/03/2016 0801   K 3.8 12/03/2016 0801   CL 107 04/28/2015 1133   CO2 23 12/03/2016 0801   GLUCOSE 116 12/03/2016 0801   BUN 11.5 12/03/2016 0801   CREATININE 0.7 12/03/2016 0801   CALCIUM 8.1 (L) 12/03/2016 0801   PROT 6.5 12/03/2016 0801   ALBUMIN 3.0 (L) 12/03/2016 0801   AST 84 (H) 12/03/2016 0801   ALT 68 (  H) 12/03/2016 0801   ALKPHOS 90 12/03/2016 0801   BILITOT 0.94 12/03/2016 0801   GFRNONAA 102.34 01/25/2010 0858   GFRAA  11/24/2008 1315    >60        The eGFR has been calculated using the MDRD equation. This calculation has not been validated in all clinical situations. eGFR's persistently <60 mL/min signify possible Chronic Kidney Disease.    No results found for: TOTALPROTELP, ALBUMINELP, A1GS, A2GS, BETS, BETA2SER, GAMS, MSPIKE, SPEI  No results found for: KPAFRELGTCHN, LAMBDASER, KAPLAMBRATIO  Lab Results  Component Value Date   WBC 2.0 (L) 12/03/2016   NEUTROABS 1.2 (L) 12/03/2016   HGB 11.7 12/03/2016   HCT 33.9 (L) 12/03/2016   MCV 98.0 12/03/2016   PLT 87 (L) 12/03/2016      Chemistry      Component Value Date/Time   NA 140 12/03/2016 0801   K 3.8 12/03/2016 0801   CL 107 04/28/2015 1133   CO2 23 12/03/2016 0801   BUN 11.5 12/03/2016 0801   CREATININE 0.7 12/03/2016 0801      Component Value Date/Time   CALCIUM 8.1 (L) 12/03/2016 0801   ALKPHOS 90 12/03/2016 0801   AST 84 (H) 12/03/2016 0801   ALT 68 (H) 12/03/2016 0801   BILITOT 0.94 12/03/2016 0801       No results found for: LABCA2  No components found for: [id number]  No results for input(s): INR in the last 168 hours.  Urinalysis    Component Value Date/Time   COLORURINE LT. YELLOW 01/25/2010 0858   APPEARANCEUR CLEAR 01/25/2010 0858   LABSPEC >=1.030 01/25/2010 0858   PHURINE 5.5 01/25/2010 0858   GLUCOSEU NEGATIVE 01/25/2010  0858   HGBUR 1+ 12/30/2008 0838   BILIRUBINUR Neg 04/28/2015 1122   KETONESUR 15 (?) 01/25/2010 0858   PROTEINUR Neg 04/28/2015 1122   PROTEINUR NEGATIVE 11/24/2008 1545   UROBILINOGEN 0.2 04/28/2015 1122   UROBILINOGEN 0.2 01/25/2010 0858   NITRITE Neg 04/28/2015 1122   NITRITE NEGATIVE 01/25/2010 0858   LEUKOCYTESUR small (1+) (A) 04/28/2015 1122     STUDIES: Mr Total Spine Mets Screening  Result Date: 11/11/2016 CLINICAL DATA:  Stage IV metastatic breast cancer. Evaluate spinal metastatic disease. EXAM: MRI TOTAL SPINE WITHOUT AND WITH CONTRAST TECHNIQUE: Multisequence MR imaging of the spine from the cervical spine to the sacrum was performed prior to and following IV contrast administration for evaluation of spinal metastatic disease. CONTRAST:  9m MULTIHANCE GADOBENATE DIMEGLUMINE 529 MG/ML IV SOLN COMPARISON:  Nuclear medicine study 10/30/2016.  CT 10/30/2016. FINDINGS: MRI CERVICAL SPINE FINDINGS Alignment: Normal Vertebrae: No metastatic disease at C5 or above. Metastatic disease affecting the C6 and C7 vertebral bodies with involvement of the posterior elements at C6 on the left and C7 on the right. No evidence of fracture or extraosseous tumor extension. Cord: No cord compression or cord metastasis. Posterior Fossa, vertebral arteries, paraspinal tissues: Negative as incompletely evaluated. Disc levels: Mild non-compressive spondylosis at C5-6. MRI THORACIC SPINE FINDINGS Alignment:  Normal Vertebrae: Extensive metastatic disease throughout the thoracic region affecting all vertebral body levels. Most extensive at T1, T2, T5, T6, T9, T10, T11 and T12. No extraosseous tumor extension. Cord: Tiny enhancing focus along the dorsal aspect of the cord at T11-12. This could be a tiny surface metastasis or could be a vascular structure. Paraspinal and other soft tissues: No extraosseous tumor identified. Metastatic disease is also noted within the sternum. Disc levels: Ordinary spondylosis at  T8-9.  Other levels are unremarkable. MRI LUMBAR  SPINE FINDINGS Segmentation:  5 lumbar type vertebral bodies Alignment:  Normal Vertebrae: Extensive metastatic disease throughout the region, with relatively less involvement of L1. Extensive involvement also of the upper sacrum. Minor superior endplate fracture at L5 with loss of height of 10%. This could be recent related to bone weakening from the metastatic disease. No evidence of extraosseous tumor extension in the region. Conus medullaris: Extends to the L2 level and appears normal. Paraspinal and other soft tissues: Otherwise negative Disc levels: Mild disc bulge at L4-5 without stenosis or neural compression. Mild lower lumbar facet osteoarthritis. IMPRESSION: Bony metastatic disease beginning at C6 and extending through the sacrum. Involvement of every vertebral body level throughout that region. No extraosseous tumor extension or nerve compression secondary to tumor. Question minimal superior endplate fracture at L5 possibly secondary to the metastatic disease. It is also possible this could be an older fracture. Tiny focus of enhancement along the dorsal cord at T11-12. This could be a tiny surface metastasis or a vascular structure. This appears to be along the surface of the cord rather than within the substance. Electronically Signed   By: Nelson Chimes M.D.   On: 11/11/2016 11:33   Mm Clip Placement Left  Result Date: 11/18/2016 CLINICAL DATA:  Status post MRI guided left breast biopsy today. EXAM: DIAGNOSTIC LEFT MAMMOGRAM POST MRI BIOPSY COMPARISON:  Previous exam(s). FINDINGS: Mammographic images were obtained following MRI guided biopsy of suspicious non mass enhancement within the upper-outer quadrant (12-1 o'clock axis region). At the conclusion of the procedure, a barbell shaped clip was deployed into the biopsy cavity. Barbell shaped clip is displaced approximately 4 cm medially. IMPRESSION: Status post MRI guided left breast biopsy of  suspicious non mass enhancement within the upper-outer quadrant (12-1 o'clock axis). Barbell shaped biopsy clip is displaced approximately 4 cm medial to the biopsy site. Final Assessment: Post Procedure Mammograms for Marker Placement Electronically Signed   By: Franki Cabot M.D.   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 This was found to be concordant by Dr. Franki Cabot, with excision recommended. Excision, at minimum, is recommended as MRI findings suggest a high likelihood for pathologic upgrade at surgical excision. Pathology results were discussed with the patient by telephone. The patient reported doing well after the biopsy with tenderness at the site. Post biopsy instructions and care were reviewed and questions were answered. The patient was encouraged to call [facility] for any additional concerns. The patient has a recent diagnosis of right breast cancer and should follow her outlined treatment plan. Pathology results reported by Terie Purser, RN on 11/21/2016. Electronically Signed   By: Franki Cabot M.D.   On: 11/21/2016 13:36   Result Date: 11/21/2016 CLINICAL DATA:  Patient with recent biopsy-proven right breast cancer. Subsequent breast MRI showing suspicious non-mass enhancement within the left breast for which patient presents today for MRI guided biopsy. EXAM: MRI GUIDED CORE NEEDLE BIOPSY OF THE LEFT BREAST TECHNIQUE: Multiplanar, multisequence MR imaging of the left breast was performed without intravenous contrast. CONTRAST:  None.  No IV access. COMPARISON:  Previous exams. FINDINGS: I met with the patient, and we discussed the procedure of MRI guided biopsy, including risks, benefits, and alternatives. Specifically, we discussed the risks of infection,  bleeding, tissue injury, clip  migration, and inadequate sampling. Informed, written consent was given. The usual time out protocol was performed immediately prior to the procedure. Using sterile technique, 1% Lidocaine, MRI guidance, and a 9 gauge vacuum assisted device, biopsy was performed of the suspicious non-mass enhancement within the upper-outer quadrant of the left breast using a lateral approach. At the conclusion of the procedure, a tissue marker clip was deployed into the biopsy cavity. Follow-up 2-view mammogram was performed and dictated separately. IMPRESSION: MRI guided biopsy of the non-mass enhancement in the upper-outer quadrant of the left breast. No apparent complications. Electronically Signed: By: Franki Cabot M.D. On: 11/18/2016 08:57    ELIGIBLE FOR AVAILABLE RESEARCH PROTOCOL: no  ASSESSMENT: 71 y.o. 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 There was a 4 cm right breast mass with right axillary lymph nodes and evidence of cirrhosis.  (3) neoadjuvant treatment to consist of cyclophosphamide, methotrexate, and fluorouracil (CMF) chemotherapy every 21 days 8, starting 11/05/2016  (4) definitive surgery to follow  (5) adjuvant radiation to follow surgery  (6) anti-estrogen therapy to follow at the completion of local treatment  PLAN: Jasmine Hood is doing very well today.  I reviewed her labs with her in detail.  She is not neutropenic  today.  She does not need to take the Cipro.  She and I reviewed her medication list in great detail and she took excellent notes about this.  She now understands how to take her medications following chemotherapy.    Jasmine Hood will return on 7/31 for labs, appt with me, and chemotherapy.  She knows to call between now and then for any questions or concerns.    A total of (30) minutes of face-to-face time was spent with this patient with greater than 50% of that time in counseling and care-coordination.    Scot Dock, NP   12/04/2016 8:13 AM Medical Oncology and Hematology [facility] [address], [place] [address] Tel. [phone]    Fax. [phone]

## 2016-12-03 ENCOUNTER — Ambulatory Visit (HOSPITAL_BASED_OUTPATIENT_CLINIC_OR_DEPARTMENT_OTHER): Payer: Medicare HMO | Admitting: Adult Health

## 2016-12-03 ENCOUNTER — Other Ambulatory Visit (HOSPITAL_BASED_OUTPATIENT_CLINIC_OR_DEPARTMENT_OTHER): Payer: Medicare HMO

## 2016-12-03 ENCOUNTER — Ambulatory Visit (HOSPITAL_BASED_OUTPATIENT_CLINIC_OR_DEPARTMENT_OTHER): Payer: Medicare HMO

## 2016-12-03 VITALS — BP 184/59 | HR 80 | Temp 97.7°F | Resp 16 | Ht 59.5 in | Wt 236.9 lb

## 2016-12-03 DIAGNOSIS — C7951 Secondary malignant neoplasm of bone: Secondary | ICD-10-CM | POA: Diagnosis not present

## 2016-12-03 DIAGNOSIS — C50811 Malignant neoplasm of overlapping sites of right female breast: Secondary | ICD-10-CM

## 2016-12-03 DIAGNOSIS — Z170 Estrogen receptor positive status [ER+]: Secondary | ICD-10-CM

## 2016-12-03 DIAGNOSIS — Z452 Encounter for adjustment and management of vascular access device: Secondary | ICD-10-CM

## 2016-12-03 DIAGNOSIS — Z95828 Presence of other vascular implants and grafts: Secondary | ICD-10-CM

## 2016-12-03 LAB — CBC WITH DIFFERENTIAL/PLATELET
BASO%: 1 % (ref 0.0–2.0)
Basophils Absolute: 0 10*3/uL (ref 0.0–0.1)
EOS%: 0.5 % (ref 0.0–7.0)
Eosinophils Absolute: 0 10*3/uL (ref 0.0–0.5)
HEMATOCRIT: 33.9 % — AB (ref 34.8–46.6)
HEMOGLOBIN: 11.7 g/dL (ref 11.6–15.9)
LYMPH#: 0.7 10*3/uL — AB (ref 0.9–3.3)
LYMPH%: 33.8 % (ref 14.0–49.7)
MCH: 33.8 pg (ref 25.1–34.0)
MCHC: 34.5 g/dL (ref 31.5–36.0)
MCV: 98 fL (ref 79.5–101.0)
MONO#: 0 10*3/uL — AB (ref 0.1–0.9)
MONO%: 1.5 % (ref 0.0–14.0)
NEUT#: 1.2 10*3/uL — ABNORMAL LOW (ref 1.5–6.5)
NEUT%: 63.2 % (ref 38.4–76.8)
PLATELETS: 87 10*3/uL — AB (ref 145–400)
RBC: 3.46 10*6/uL — ABNORMAL LOW (ref 3.70–5.45)
RDW: 12.9 % (ref 11.2–14.5)
WBC: 2 10*3/uL — ABNORMAL LOW (ref 3.9–10.3)

## 2016-12-03 LAB — COMPREHENSIVE METABOLIC PANEL
ALBUMIN: 3 g/dL — AB (ref 3.5–5.0)
ALT: 68 U/L — ABNORMAL HIGH (ref 0–55)
AST: 84 U/L — AB (ref 5–34)
Alkaline Phosphatase: 90 U/L (ref 40–150)
Anion Gap: 9 mEq/L (ref 3–11)
BILIRUBIN TOTAL: 0.94 mg/dL (ref 0.20–1.20)
BUN: 11.5 mg/dL (ref 7.0–26.0)
CALCIUM: 8.1 mg/dL — AB (ref 8.4–10.4)
CO2: 23 mEq/L (ref 22–29)
CREATININE: 0.7 mg/dL (ref 0.6–1.1)
Chloride: 108 mEq/L (ref 98–109)
EGFR: 86 mL/min/{1.73_m2} — ABNORMAL LOW (ref >90)
Glucose: 116 mg/dl (ref 70–140)
Potassium: 3.8 mEq/L (ref 3.5–5.1)
Sodium: 140 mEq/L (ref 136–145)
TOTAL PROTEIN: 6.5 g/dL (ref 6.4–8.3)

## 2016-12-03 MED ORDER — SODIUM CHLORIDE 0.9% FLUSH
10.0000 mL | Freq: Once | INTRAVENOUS | Status: AC
  Administered 2016-12-03: 10 mL
  Filled 2016-12-03: qty 10

## 2016-12-03 MED ORDER — HEPARIN SOD (PORK) LOCK FLUSH 100 UNIT/ML IV SOLN
500.0000 [IU] | Freq: Once | INTRAVENOUS | Status: AC
  Administered 2016-12-03: 500 [IU]
  Filled 2016-12-03: qty 5

## 2016-12-03 NOTE — Patient Instructions (Signed)
Implanted Port Home Guide An implanted port is a type of central line that is placed under the skin. Central lines are used to provide IV access when treatment or nutrition needs to be given through a person's veins. Implanted ports are used for long-term IV access. An implanted port may be placed because:  You need IV medicine that would be irritating to the small veins in your hands or arms.  You need long-term IV medicines, such as antibiotics.  You need IV nutrition for a long period.  You need frequent blood draws for lab tests.  You need dialysis.  Implanted ports are usually placed in the chest area, but they can also be placed in the upper arm, the abdomen, or the leg. An implanted port has two main parts:  Reservoir. The reservoir is round and will appear as a small, raised area under your skin. The reservoir is the part where a needle is inserted to give medicines or draw blood.  Catheter. The catheter is a thin, flexible tube that extends from the reservoir. The catheter is placed into a large vein. Medicine that is inserted into the reservoir goes into the catheter and then into the vein.  How will I care for my incision site? Do not get the incision site wet. Bathe or shower as directed by your health care provider. How is my port accessed? Special steps must be taken to access the port:  Before the port is accessed, a numbing cream can be placed on the skin. This helps numb the skin over the port site.  Your health care provider uses a sterile technique to access the port. ? Your health care provider must put on a mask and sterile gloves. ? The skin over your port is cleaned carefully with an antiseptic and allowed to dry. ? The port is gently pinched between sterile gloves, and a needle is inserted into the port.  Only "non-coring" port needles should be used to access the port. Once the port is accessed, a blood return should be checked. This helps ensure that the port  is in the vein and is not clogged.  If your port needs to remain accessed for a constant infusion, a clear (transparent) bandage will be placed over the needle site. The bandage and needle will need to be changed every week, or as directed by your health care provider.  Keep the bandage covering the needle clean and dry. Do not get it wet. Follow your health care provider's instructions on how to take a shower or bath while the port is accessed.  If your port does not need to stay accessed, no bandage is needed over the port.  What is flushing? Flushing helps keep the port from getting clogged. Follow your health care provider's instructions on how and when to flush the port. Ports are usually flushed with saline solution or a medicine called heparin. The need for flushing will depend on how the port is used.  If the port is used for intermittent medicines or blood draws, the port will need to be flushed: ? After medicines have been given. ? After blood has been drawn. ? As part of routine maintenance.  If a constant infusion is running, the port may not need to be flushed.  How long will my port stay implanted? The port can stay in for as long as your health care provider thinks it is needed. When it is time for the port to come out, surgery will be   done to remove it. The procedure is similar to the one performed when the port was put in. When should I seek immediate medical care? When you have an implanted port, you should seek immediate medical care if:  You notice a bad smell coming from the incision site.  You have swelling, redness, or drainage at the incision site.  You have more swelling or pain at the port site or the surrounding area.  You have a fever that is not controlled with medicine.  This information is not intended to replace advice given to you by your health care provider. Make sure you discuss any questions you have with your health care provider. Document  Released: 05/06/2005 Document Revised: 10/12/2015 Document Reviewed: 01/11/2013 Elsevier Interactive Patient Education  2017 Elsevier Inc.  

## 2016-12-03 NOTE — Patient Instructions (Signed)

## 2016-12-04 ENCOUNTER — Encounter: Payer: Self-pay | Admitting: Adult Health

## 2016-12-16 NOTE — Progress Notes (Signed)
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  She is doing well.  She says her right breast is hurting her.  She says this pain has increased and it is more black like.  She wants to review her medications in detail.  She has brought several papers from her insurance company regarding approval for me to look at.    REVIEW OF SYSTEMS: She is not taking her nausea medications, she only takes a queasy pop.  She denies fevers, chills, mouth sores.  She denies any new pain.  Otherwise a detailed ROS is non contributory.    BREAST CANCER HISTORY: From the original intake note  Jasmine Hood herself noted a change in her right breast sometime in September 2017.Marland Kitchen She did not immediately bring it to medical attention. When she noted some significant changes in her right nipple she saw [name] and was set up for bilateral diagnostic mammography with tomography and [facility]. This found the breast density to be category B. In the upper and lower outer quadrants of the right breast there was a mass measuring at least 10 cm. There were also groups of heterogeneous calcifications measuring 3 cm and a separate group 0.4 cm. On physical exam there was  a palpable firm mass measuring approximately 10 cm involving the upper outer and lower outer quadrant of the right breast, with skin reaction and nipple retraction. There was no palpable right axillary adenopathy.  Right breast ultrasonography confirmed a large hypoechoic mass with irregular margins extending to the skin surface. The right axilla showed 2 lymph nodes which appeared abnormal.  In the left breast there were some indeterminate retroareolar calcifications which were felt to warrant biopsy. This was performed 10/08/2016 and showed a fibroadenoma ([id number]).  Biopsy of the right breast mass at the 7:00 and 10:00 positions as well as one of the suspicious lymph nodes all showed invasive ductal carcinoma, E-cadherin positive. Separate prognostic profiles from the 2 breast masses were sent. Estrogen receptor was positive at 95-70%, progesterone receptor was positive at 15-85%, all with strong staining intensity, the MIB-1 was 15-20%, and HER-2 was nonamplified, the signals ratio being 1.28-1.72 and the number per cell 1.85-3.95.  The patient's subsequent history is as detailed below   PAST MEDICAL HISTORY: Past Medical History:  Diagnosis Date  . Breast cancer ([place]) 10/07/2016   bilateral breast   . [place] Shark recluse spider bite    right leg  . COLONIC POLYPS, HX OF 01/05/2009   Qualifier: Diagnosis of  By: [name]  MD, [name]   . Complication of anesthesia    slow to awaken after wisdom  teeth extraction age 45  . Genetic testing 11/27/2016   Ms. Stenseth underwent genetic counseling and testing for hereditary cancer syndromes on 11/19/2016. Her results were negative for mutations in all 46 genes analyzed  by [facility]'s 46-gene Common Hereditary Cancers Panel. Genes analyzed include: APC, ATM, AXIN2, BARD1, BMPR1A, BRCA1, BRCA2, BRIP1, CDH1, CDKN2A, CHEK2, CTNNA1, DICER1, EPCAM, GREM1, HOXB13, KIT, MEN1, MLH1, MSH2, MSH3, MSH6, MUTYH, NBN,  . HYPOTHYROIDISM 02/01/2010   Qualifier:  Diagnosis of  By: [name]  MD, [name]   . Morbid obesity ([place]) 01/05/2009   Qualifier: Diagnosis of  By: [name]  MD, [name]   . Pneumonia 1987  . Sleep apnea    no cpap used     PAST SURGICAL HISTORY: Past Surgical History:  Procedure Laterality Date  . BREAST BIOPSY Right 10/07/2016   invasive mammary carcinoma  . BREAST BIOPSY Left 10/08/2016   left  breast fibroadenoma no malignancy  . CATARACT EXTRACTION Bilateral   . COLONOSCOPY WITH PROPOFOL N/A 12/22/2015   Procedure: COLONOSCOPY WITH PROPOFOL;  Surgeon: [name], MD;  Location: [facility];  Service: Endoscopy;  Laterality: N/A;  . colonscopy  2012   with polyp removed  . MOLE REMOVAL  10/29/2016   Procedure: MOLE REMOVAL LEFT CHEST;  Surgeon: Alphonsa Overall, MD;  Location: [facility];  Service: General;;  . PORTACATH PLACEMENT N/A 10/29/2016   Procedure: INSERTION PORT-A-CATH;  Surgeon: Alphonsa Overall, MD;  Location: [facility];  Service: General;  Laterality: N/A;  . surgery for spider bite  1998  . TUBAL LIGATION    . WISDOM TOOTH EXTRACTION  age 71    FAMILY HISTORY Family History  Problem Relation Age of Onset  . Breast cancer Mother 33       d.52 from metastatic disease  . Throat cancer Father        d.65 history of smoking  . Rheum arthritis Sister        3 sisters pos. for osteo and RA  . Diabetes Maternal Aunt   . Cancer Maternal Grandfather        mouth cancer-chewed tobacco  . Breast cancer Cousin 73  . Breast cancer Daughter 43  . Stomach cancer Sister 77  The patient's father died at the age of 82 from laryngeal cancer in the setting of tobacco abuse. The patient's mother died at the age of 32 from metastatic breast cancer which had been diagnosed in her early 50s. The patient has 2 half-sisters and one half-brother. One of her sisters has "stomach" cancer. There is also a cousin on the maternal side with breast cancer diagnosed at age 80. No family member has been genetically tested  yet  GYNECOLOGIC HISTORY:  No LMP recorded. Patient is postmenopausal. Menarche age 20, first live birth age 21, she is [place] P5. She is status post bilateral tubal ligation. She stopped having periods in 1985. She tells me she did not use hormone replacement. However a note from her bone density scan December 2001 states "this patient began hormone replacement therapy 3 months ago"  SOCIAL HISTORY:  She is originally from [place]. 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 She has survived 2 husband's period at home currently is just she and her daughter Jasmine Hood. She tells me this daughter had mild brain damage at birth and is not working, and cannot drive because of a history of seizures. However she is the one she is planning to name is her healthcare power of attorney. The patient has 13 grandchildren and 4 great-grandchildren. She attends a [employer]: At the 10/21/2016 visit the patient was given the appropriate documents to complete and notarize at  her discretion   HEALTH MAINTENANCE: Social History  Substance Use Topics  . Smoking status: Never Smoker  . Smokeless tobacco: Never Used  . Alcohol use No     Colonoscopy: 12/22/2015/Nandigam  PAP:  Bone density: December 2001 was normal   Allergies  Allergen Reactions  . Bee Venom Anaphylaxis    Current Outpatient Prescriptions  Medication Sig Dispense Refill  . acetaminophen (TYLENOL) 500 MG tablet Take 500 mg by mouth daily as needed for moderate pain or headache.    . ciprofloxacin (CIPRO) 500 MG tablet Take 1 tablet (500 mg total) by mouth 2 (two) times daily. 14 tablet 0  . dexamethasone (DECADRON) 4 MG tablet Take 2 tablets (8 mg total) by mouth daily. Start the day after chemotherapy for 2 days. Take with food. 30 tablet 1  . EPINEPHrine (EPIPEN 2-PAK) 0.3 mg/0.3 mL SOAJ injection Inject 0.3 mLs (0.3 mg total) into the muscle once. 1 Device 2  . fluconazole (DIFLUCAN)  100 MG tablet Take 1 tablet (100 mg total) by mouth daily. Take for 10 days following treatment, every 21 days 70 tablet 1  . lidocaine-prilocaine (EMLA) cream Apply 1 application topically as needed. 30 g 0  . magic mouthwash w/lidocaine SOLN Take 5 mLs by mouth 4 (four) times daily as needed for mouth pain. 240 mL 0  . promethazine (PHENERGAN) 25 MG suppository Place 1 suppository (25 mg total) rectally every 6 (six) hours as needed for nausea or vomiting. 12 each 4  . triamcinolone ointment (KENALOG) 0.5 % Apply 1 application topically 2 (two) times daily. 30 g 0  . valACYclovir (VALTREX) 500 MG tablet Take 1 tablet (500 mg total) by mouth daily. 30 tablet 5   No current facility-administered medications for this visit.     OBJECTIVE:   Vitals:   12/17/16 0923  BP: (!) 132/95  Pulse: 83  Resp: (!) 21  Temp: 97.9 F (36.6 C)     Body mass index is 48.02 kg/m.    ECOG FS:1 - Symptomatic but completely ambulatory   GENERAL: Patient is a well appearing female in no acute distress HEENT:  Sclerae anicteric.  Oropharynx clear and moist. No ulcerations or evidence of oropharyngeal candidiasis. Neck is supple.  NODES:  No cervical, supraclavicular, or axillary lymphadenopathy palpated.  BREAST EXAM: see below LUNGS:  Clear to auscultation bilaterally.  No wheezes or rhonchi. HEART:  Regular rate and rhythm. No murmur appreciated. ABDOMEN:  Soft, nontender.  Positive, normoactive bowel sounds. No organomegaly palpated. MSK:  No focal spinal tenderness to palpation. Full range of motion bilaterally in the upper extremities. EXTREMITIES:  No peripheral edema.   SKIN:  Clear with no obvious rashes or skin changes. No nail dyscrasia. NEURO:  Nonfocal. Well oriented.  Appropriate affect.  Right breast 12/17/2016   Right breast 11/12/16     LAB RESULTS:  CMP     Component Value Date/Time   NA 140 12/17/2016 0757   K 4.2 12/17/2016 0757   CL 107 04/28/2015 1133   CO2 25 12/17/2016  0757   GLUCOSE 118 12/17/2016 0757   BUN 7.5 12/17/2016 0757   CREATININE 0.7 12/17/2016 0757   CALCIUM 9.5 12/17/2016 0757   PROT 6.5 12/17/2016 0757   ALBUMIN 3.1 (L) 12/17/2016 0757   AST 40 (H) 12/17/2016 0757   ALT 30 12/17/2016 0757   ALKPHOS 108 12/17/2016 0757   BILITOT 0.49 12/17/2016 0757   GFRNONAA 102.34 01/25/2010 0858   GFRAA  11/24/2008 1315    >  60        The eGFR has been calculated using the MDRD equation. This calculation has not been validated in all clinical situations. eGFR's persistently <60 mL/min signify possible Chronic Kidney Disease.    No results found for: TOTALPROTELP, ALBUMINELP, A1GS, A2GS, BETS, BETA2SER, GAMS, MSPIKE, SPEI  No results found for: Nils Pyle, [facility]  Lab Results  Component Value Date   WBC 3.1 (L) 12/17/2016   NEUTROABS 1.3 (L) 12/17/2016   HGB 11.7 12/17/2016   HCT 34.8 12/17/2016   MCV 101.0 12/17/2016   PLT 165 12/17/2016      Chemistry      Component Value Date/Time   NA 140 12/17/2016 0757   K 4.2 12/17/2016 0757   CL 107 04/28/2015 1133   CO2 25 12/17/2016 0757   BUN 7.5 12/17/2016 0757   CREATININE 0.7 12/17/2016 0757      Component Value Date/Time   CALCIUM 9.5 12/17/2016 0757   ALKPHOS 108 12/17/2016 0757   AST 40 (H) 12/17/2016 0757   ALT 30 12/17/2016 0757   BILITOT 0.49 12/17/2016 0757       No results found for: LABCA2  No components found for: [id number]  No results for input(s): INR in the last 168 hours.  Urinalysis    Component Value Date/Time   COLORURINE LT. YELLOW 01/25/2010 0858   APPEARANCEUR CLEAR 01/25/2010 0858   LABSPEC >=1.030 01/25/2010 0858   PHURINE 5.5 01/25/2010 0858   GLUCOSEU NEGATIVE 01/25/2010 0858   HGBUR 1+ 12/30/2008 0838   BILIRUBINUR Neg 04/28/2015 1122   KETONESUR 15 (?) 01/25/2010 0858   PROTEINUR Neg 04/28/2015 1122   PROTEINUR NEGATIVE 11/24/2008 1545   UROBILINOGEN 0.2 04/28/2015 1122   UROBILINOGEN 0.2 01/25/2010 0858    NITRITE Neg 04/28/2015 1122   NITRITE NEGATIVE 01/25/2010 0858   LEUKOCYTESUR small (1+) (A) 04/28/2015 1122     STUDIES: Mm Clip Placement Left  Result Date: 11/18/2016 CLINICAL DATA:  Status post MRI guided left breast biopsy today. EXAM: DIAGNOSTIC LEFT MAMMOGRAM POST MRI BIOPSY COMPARISON:  Previous exam(s). FINDINGS: Mammographic images were obtained following MRI guided biopsy of suspicious non mass enhancement within the upper-outer quadrant (12-1 o'clock axis region). At the conclusion of the procedure, a barbell shaped clip was deployed into the biopsy cavity. Barbell shaped clip is displaced approximately 4 cm medially. IMPRESSION: Status post MRI guided left breast biopsy of suspicious non mass enhancement within the upper-outer quadrant (12-1 o'clock axis). Barbell shaped biopsy clip is displaced approximately 4 cm medial to the biopsy site. Final Assessment: Post Procedure Mammograms for Marker Placement Electronically Signed   By: Franki Cabot M.D.   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 This was found to be concordant by Dr. Franki Cabot, with excision recommended. Excision, at minimum, is recommended as MRI findings suggest a high likelihood for pathologic upgrade at surgical excision. Pathology results were discussed with the patient by telephone. The patient reported doing well after the biopsy with tenderness at the site. Post biopsy instructions and care were reviewed and questions were answered. The patient was encouraged to call [facility] for any additional concerns. The patient has a recent diagnosis of right breast cancer and should follow her outlined treatment plan. Pathology results reported by Terie Purser, RN  on 11/21/2016. Electronically Signed    By: Franki Cabot M.D.   On: 11/21/2016 13:36   Result Date: 11/21/2016 CLINICAL DATA:  Patient with recent biopsy-proven right breast cancer. Subsequent breast MRI showing suspicious non-mass enhancement within the left breast for which patient presents today for MRI guided biopsy. EXAM: MRI GUIDED CORE NEEDLE BIOPSY OF THE LEFT BREAST TECHNIQUE: Multiplanar, multisequence MR imaging of the left breast was performed without intravenous contrast. CONTRAST:  None.  No IV access. COMPARISON:  Previous exams. FINDINGS: I met with the patient, and we discussed the procedure of MRI guided biopsy, including risks, benefits, and alternatives. Specifically, we discussed the risks of infection, bleeding, tissue injury, clip migration, and inadequate sampling. Informed, written consent was given. The usual time out protocol was performed immediately prior to the procedure. Using sterile technique, 1% Lidocaine, MRI guidance, and a 9 gauge vacuum assisted device, biopsy was performed of the suspicious non-mass enhancement within the upper-outer quadrant of the left breast using a lateral approach. At the conclusion of the procedure, a tissue marker clip was deployed into the biopsy cavity. Follow-up 2-view mammogram was performed and dictated separately. IMPRESSION: MRI guided biopsy of the non-mass enhancement in the upper-outer quadrant of the left breast. No apparent complications. Electronically Signed: By: Franki Cabot M.D. On: 11/18/2016 08:57    ELIGIBLE FOR AVAILABLE RESEARCH PROTOCOL: no  ASSESSMENT: 71 y.o. 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 There was a 4 cm right breast mass with right axillary lymph nodes and evidence of cirrhosis.  (3) neoadjuvant treatment to consist of cyclophosphamide, methotrexate, and fluorouracil (CMF) chemotherapy every 21 days 8, starting 11/05/2016  (4) definitive surgery to follow  (5) adjuvant radiation to follow surgery  (6) anti-estrogen therapy to follow at the completion of local treatment  PLAN: Amoy is doing well today.  I reviewed her labs with her which are largely stable.  She has an Gray of 1.3 and will proceed with treatment today.  She and I spent a great deal of today's appointment reviewing how to take her medications.  I reviewed her insurance paperwork and explained it to her.  I think her breast appears improved from prior.  She will return in one week for labs and an appointment.  She and I also worked on scheduling a couple more treatments of hers as well.     A total of (30) minutes of face-to-face time was spent with this patient with greater than 50% of that time in counseling and care-coordination.    Scot Dock, NP   12/17/2016 9:34 PM Medical Oncology and Hematology [facility] [address] [place], [place] [address] Tel. [phone]    Fax. [phone]

## 2016-12-17 ENCOUNTER — Ambulatory Visit: Payer: Medicare HMO

## 2016-12-17 ENCOUNTER — Other Ambulatory Visit (HOSPITAL_BASED_OUTPATIENT_CLINIC_OR_DEPARTMENT_OTHER): Payer: Medicare HMO

## 2016-12-17 ENCOUNTER — Ambulatory Visit (HOSPITAL_BASED_OUTPATIENT_CLINIC_OR_DEPARTMENT_OTHER): Payer: Medicare HMO

## 2016-12-17 ENCOUNTER — Ambulatory Visit (HOSPITAL_BASED_OUTPATIENT_CLINIC_OR_DEPARTMENT_OTHER): Payer: Medicare HMO | Admitting: Adult Health

## 2016-12-17 ENCOUNTER — Other Ambulatory Visit: Payer: Self-pay | Admitting: Oncology

## 2016-12-17 ENCOUNTER — Encounter: Payer: Self-pay | Admitting: Adult Health

## 2016-12-17 VITALS — BP 132/95 | HR 83 | Temp 97.9°F | Resp 21 | Ht 59.5 in | Wt 241.8 lb

## 2016-12-17 DIAGNOSIS — C50811 Malignant neoplasm of overlapping sites of right female breast: Secondary | ICD-10-CM

## 2016-12-17 DIAGNOSIS — C7951 Secondary malignant neoplasm of bone: Secondary | ICD-10-CM

## 2016-12-17 DIAGNOSIS — Z170 Estrogen receptor positive status [ER+]: Secondary | ICD-10-CM

## 2016-12-17 DIAGNOSIS — C773 Secondary and unspecified malignant neoplasm of axilla and upper limb lymph nodes: Secondary | ICD-10-CM | POA: Diagnosis not present

## 2016-12-17 DIAGNOSIS — K7469 Other cirrhosis of liver: Secondary | ICD-10-CM

## 2016-12-17 DIAGNOSIS — Z95828 Presence of other vascular implants and grafts: Secondary | ICD-10-CM

## 2016-12-17 DIAGNOSIS — Z6841 Body Mass Index (BMI) 40.0 and over, adult: Secondary | ICD-10-CM

## 2016-12-17 DIAGNOSIS — Z5111 Encounter for antineoplastic chemotherapy: Secondary | ICD-10-CM

## 2016-12-17 LAB — COMPREHENSIVE METABOLIC PANEL
ALT: 30 U/L (ref 0–55)
ANION GAP: 8 meq/L (ref 3–11)
AST: 40 U/L — ABNORMAL HIGH (ref 5–34)
Albumin: 3.1 g/dL — ABNORMAL LOW (ref 3.5–5.0)
Alkaline Phosphatase: 108 U/L (ref 40–150)
BILIRUBIN TOTAL: 0.49 mg/dL (ref 0.20–1.20)
BUN: 7.5 mg/dL (ref 7.0–26.0)
CHLORIDE: 107 meq/L (ref 98–109)
CO2: 25 mEq/L (ref 22–29)
Calcium: 9.5 mg/dL (ref 8.4–10.4)
Creatinine: 0.7 mg/dL (ref 0.6–1.1)
EGFR: 86 mL/min/{1.73_m2} — AB (ref >90)
Glucose: 118 mg/dl (ref 70–140)
Potassium: 4.2 mEq/L (ref 3.5–5.1)
Sodium: 140 mEq/L (ref 136–145)
Total Protein: 6.5 g/dL (ref 6.4–8.3)

## 2016-12-17 LAB — CBC WITH DIFFERENTIAL/PLATELET
BASO%: 1.2 % (ref 0.0–2.0)
BASOS ABS: 0 10*3/uL (ref 0.0–0.1)
EOS ABS: 0.1 10*3/uL (ref 0.0–0.5)
EOS%: 2.9 % (ref 0.0–7.0)
HCT: 34.8 % (ref 34.8–46.6)
HGB: 11.7 g/dL (ref 11.6–15.9)
LYMPH%: 32.4 % (ref 14.0–49.7)
MCH: 34.1 pg — AB (ref 25.1–34.0)
MCHC: 33.8 g/dL (ref 31.5–36.0)
MCV: 101 fL (ref 79.5–101.0)
MONO#: 0.7 10*3/uL (ref 0.1–0.9)
MONO%: 22.9 % — ABNORMAL HIGH (ref 0.0–14.0)
NEUT#: 1.3 10*3/uL — ABNORMAL LOW (ref 1.5–6.5)
NEUT%: 40.6 % (ref 38.4–76.8)
PLATELETS: 165 10*3/uL (ref 145–400)
RBC: 3.44 10*6/uL — AB (ref 3.70–5.45)
RDW: 15.4 % — ABNORMAL HIGH (ref 11.2–14.5)
WBC: 3.1 10*3/uL — ABNORMAL LOW (ref 3.9–10.3)
lymph#: 1 10*3/uL (ref 0.9–3.3)

## 2016-12-17 LAB — TECHNOLOGIST REVIEW

## 2016-12-17 MED ORDER — SODIUM CHLORIDE 0.9 % IV SOLN
Freq: Once | INTRAVENOUS | Status: AC
  Administered 2016-12-17: 10:00:00 via INTRAVENOUS

## 2016-12-17 MED ORDER — DEXAMETHASONE SODIUM PHOSPHATE 10 MG/ML IJ SOLN
INTRAMUSCULAR | Status: AC
  Filled 2016-12-17: qty 1

## 2016-12-17 MED ORDER — DEXAMETHASONE 4 MG PO TABS: 8.0000 mg | ORAL_TABLET | Freq: Every day | ORAL | 1 refills | Status: DC

## 2016-12-17 MED ORDER — HEPARIN SOD (PORK) LOCK FLUSH 100 UNIT/ML IV SOLN
500.0000 [IU] | Freq: Once | INTRAVENOUS | Status: AC | PRN
  Administered 2016-12-17: 500 [IU]
  Filled 2016-12-17: qty 5

## 2016-12-17 MED ORDER — SODIUM CHLORIDE 0.9 % IV SOLN
600.0000 mg/m2 | Freq: Once | INTRAVENOUS | Status: AC
  Administered 2016-12-17: 1300 mg via INTRAVENOUS
  Filled 2016-12-17: qty 65

## 2016-12-17 MED ORDER — DENOSUMAB 120 MG/1.7ML ~~LOC~~ SOLN
120.0000 mg | Freq: Once | SUBCUTANEOUS | Status: AC
Start: 2016-12-17 — End: 2016-12-17
  Administered 2016-12-17: 120 mg via SUBCUTANEOUS
  Filled 2016-12-17: qty 1.7

## 2016-12-17 MED ORDER — PALONOSETRON HCL INJECTION 0.25 MG/5ML
INTRAVENOUS | Status: AC
  Filled 2016-12-17: qty 5

## 2016-12-17 MED ORDER — SODIUM CHLORIDE 0.9% FLUSH
10.0000 mL | INTRAVENOUS | Status: DC | PRN
  Administered 2016-12-17: 10 mL
  Filled 2016-12-17: qty 10

## 2016-12-17 MED ORDER — PALONOSETRON HCL INJECTION 0.25 MG/5ML
0.2500 mg | Freq: Once | INTRAVENOUS | Status: AC
  Administered 2016-12-17: 0.25 mg via INTRAVENOUS

## 2016-12-17 MED ORDER — DEXAMETHASONE SODIUM PHOSPHATE 10 MG/ML IJ SOLN
10.0000 mg | Freq: Once | INTRAMUSCULAR | Status: AC
  Administered 2016-12-17: 10 mg via INTRAVENOUS

## 2016-12-17 MED ORDER — SODIUM CHLORIDE 0.9% FLUSH
10.0000 mL | Freq: Once | INTRAVENOUS | Status: AC
  Administered 2016-12-17: 10 mL
  Filled 2016-12-17: qty 10

## 2016-12-17 MED ORDER — METHOTREXATE SODIUM (PF) CHEMO INJECTION 250 MG/10ML
85.0000 mg | Freq: Once | INTRAMUSCULAR | Status: AC
  Administered 2016-12-17: 85 mg via INTRAVENOUS
  Filled 2016-12-17: qty 3.4

## 2016-12-17 MED ORDER — FLUOROURACIL CHEMO INJECTION 2.5 GM/50ML
600.0000 mg/m2 | Freq: Once | INTRAVENOUS | Status: AC
  Administered 2016-12-17: 1300 mg via INTRAVENOUS
  Filled 2016-12-17: qty 26

## 2016-12-17 NOTE — Progress Notes (Signed)
Pt saw Mendel Ryder, NP today prior to chemo.  OK to treat with all lab results today as per NP.

## 2016-12-17 NOTE — Patient Instructions (Signed)
[facility] Discharge Instructions for Patients Receiving Chemotherapy  Today you received the following chemotherapy agents :  Methotrexate, Fluorouracil, Cytoxan.  To help prevent nausea and vomiting after your treatment, we encourage you to take your nausea medication as prescribed.   If you develop nausea and vomiting that is not controlled by your nausea medication, call the clinic.   BELOW ARE SYMPTOMS THAT SHOULD BE REPORTED IMMEDIATELY:  *FEVER GREATER THAN 100.5 F  *CHILLS WITH OR WITHOUT FEVER  NAUSEA AND VOMITING THAT IS NOT CONTROLLED WITH YOUR NAUSEA MEDICATION  *UNUSUAL SHORTNESS OF BREATH  *UNUSUAL BRUISING OR BLEEDING  TENDERNESS IN MOUTH AND THROAT WITH OR WITHOUT PRESENCE OF ULCERS  *URINARY PROBLEMS  *BOWEL PROBLEMS  UNUSUAL RASH Items with * indicate a potential emergency and should be followed up as soon as possible.  Feel free to call the clinic you have any questions or concerns. The clinic phone number is [phone].  Please show the [facility] at check-in to the Emergency Department and triage nurse.

## 2016-12-17 NOTE — Pre-Procedure Instructions (Signed)
Right breast [id number]

## 2016-12-23 ENCOUNTER — Ambulatory Visit (INDEPENDENT_AMBULATORY_CARE_PROVIDER_SITE_OTHER): Payer: Medicare HMO | Admitting: Internal Medicine

## 2016-12-23 ENCOUNTER — Encounter: Payer: Self-pay | Admitting: Internal Medicine

## 2016-12-23 VITALS — BP 142/82 | HR 78 | Temp 98.4°F | Ht 59.5 in | Wt 244.8 lb

## 2016-12-23 DIAGNOSIS — E034 Atrophy of thyroid (acquired): Secondary | ICD-10-CM | POA: Diagnosis not present

## 2016-12-23 DIAGNOSIS — Z6841 Body Mass Index (BMI) 40.0 and over, adult: Secondary | ICD-10-CM | POA: Diagnosis not present

## 2016-12-23 DIAGNOSIS — I10 Essential (primary) hypertension: Secondary | ICD-10-CM

## 2016-12-23 NOTE — Progress Notes (Signed)
Subjective:    Patient ID: Jasmine Hood, female    DOB: [birth year], 71 y.o.   MRN: [id number]  HPI  71 year old patient who is followed closely by oncology with a history of metastatic right breast cancer. She is anticipating bilateral mastectomies in the future. Doing fairly well with chemotherapy and optimistic.  She is living with her daughter. Is having some mobility issues.  Due to some weakness and left hip discomfort No recent falls  Past Medical History:  Diagnosis Date  . Breast cancer ([place]) 10/07/2016   bilateral breast   . Owens Shark recluse spider bite    right leg  . COLONIC POLYPS, HX OF 01/05/2009   Qualifier: Diagnosis of  By: [name]  MD, [name]   . Complication of anesthesia    slow to awaken after wisdom  teeth extraction age 38  . Genetic testing 11/27/2016   Ms. Brownlee underwent genetic counseling and testing for hereditary cancer syndromes on 11/19/2016. Her results were negative for mutations in all 46 genes analyzed by [facility]'s 46-gene Common Hereditary Cancers Panel. Genes analyzed include: APC, ATM, AXIN2, BARD1, BMPR1A, BRCA1, BRCA2, BRIP1, CDH1, CDKN2A, CHEK2, CTNNA1, DICER1, EPCAM, GREM1, HOXB13, KIT, MEN1, MLH1, MSH2, MSH3, MSH6, MUTYH, NBN,  . HYPOTHYROIDISM 02/01/2010   Qualifier: Diagnosis of  By: [name]  MD, [name]   . Morbid obesity ([place]) 01/05/2009   Qualifier: Diagnosis of  By: [name]  MD, [name]   . Pneumonia 1987  . Sleep apnea    no cpap used      Social History   Social History  . Marital status: Married    Spouse name: N/A  . Number of children: 5  . Years of education: N/A   Occupational History  . retired    Social History Main Topics  . Smoking status: Never Smoker  . Smokeless tobacco: Never Used  . Alcohol use No  . Drug use: No  . Sexual activity: Not on file   Other Topics Concern  . Not on file   Social History Narrative  . No narrative on file    Past Surgical History:  Procedure Laterality Date   . BREAST BIOPSY Right 10/07/2016   invasive mammary carcinoma  . BREAST BIOPSY Left 10/08/2016   left  breast fibroadenoma no malignancy  . CATARACT EXTRACTION Bilateral   . COLONOSCOPY WITH PROPOFOL N/A 12/22/2015   Procedure: COLONOSCOPY WITH PROPOFOL;  Surgeon: [name], MD;  Location: [facility];  Service: Endoscopy;  Laterality: N/A;  . colonscopy  2012   with polyp removed  . MOLE REMOVAL  10/29/2016   Procedure: MOLE REMOVAL LEFT CHEST;  Surgeon: Alphonsa Overall, MD;  Location: [facility];  Service: General;;  . PORTACATH PLACEMENT N/A 10/29/2016   Procedure: INSERTION PORT-A-CATH;  Surgeon: Alphonsa Overall, MD;  Location: [facility];  Service: General;  Laterality: N/A;  . surgery for spider bite  1998  . TUBAL LIGATION    . WISDOM TOOTH EXTRACTION  age 89    Family History  Problem Relation Age of Onset  . Breast cancer Mother 22       d.52 from metastatic disease  . Throat cancer Father        d.65 history of smoking  . Rheum arthritis Sister        3 sisters pos. for osteo and RA  . Diabetes Maternal Aunt   . Cancer Maternal Grandfather        mouth cancer-chewed tobacco  . Breast  cancer Cousin 27  . Breast cancer Daughter 62  . Stomach cancer Sister 20    Allergies  Allergen Reactions  . Bee Venom Anaphylaxis    Current Outpatient Prescriptions on File Prior to Visit  Medication Sig Dispense Refill  . acetaminophen (TYLENOL) 500 MG tablet Take 500 mg by mouth daily as needed for moderate pain or headache.    . dexamethasone (DECADRON) 4 MG tablet Take 2 tablets (8 mg total) by mouth daily. Start the day after chemotherapy for 2 days. Take with food. 30 tablet 1  . EPINEPHrine (EPIPEN 2-PAK) 0.3 mg/0.3 mL SOAJ injection Inject 0.3 mLs (0.3 mg total) into the muscle once. 1 Device 2  . fluconazole (DIFLUCAN) 100 MG tablet Take 1 tablet (100 mg total) by mouth daily. Take for 10 days following treatment, every 21 days 70 tablet 1  . lidocaine-prilocaine (EMLA)  cream Apply 1 application topically as needed. 30 g 0  . magic mouthwash w/lidocaine SOLN Take 5 mLs by mouth 4 (four) times daily as needed for mouth pain. 240 mL 0  . triamcinolone ointment (KENALOG) 0.5 % Apply 1 application topically 2 (two) times daily. 30 g 0  . promethazine (PHENERGAN) 25 MG suppository Place 1 suppository (25 mg total) rectally every 6 (six) hours as needed for nausea or vomiting. (Patient not taking: Reported on 12/23/2016) 12 each 4   No current facility-administered medications on file prior to visit.     BP (!) 142/82 (BP Location: Left Arm, Patient Position: Sitting, Cuff Size: Large)   Pulse 78   Temp 98.4 F (36.9 C) (Oral)   Ht 4' 11.5" (1.511 m)   Wt 244 lb 12.8 oz (111 kg)   SpO2 98%   BMI 48.62 kg/m     Review of Systems  Constitutional: Positive for fatigue.  HENT: Negative for congestion, dental problem, hearing loss, rhinorrhea, sinus pressure, sore throat and tinnitus.   Eyes: Negative for pain, discharge and visual disturbance.  Respiratory: Negative for cough and shortness of breath.   Cardiovascular: Positive for leg swelling. Negative for chest pain and palpitations.  Gastrointestinal: Negative for abdominal distention, abdominal pain, blood in stool, constipation, diarrhea, nausea and vomiting.  Genitourinary: Negative for difficulty urinating, dysuria, flank pain, frequency, hematuria, pelvic pain, urgency, vaginal bleeding, vaginal discharge and vaginal pain.  Musculoskeletal: Positive for arthralgias and gait problem. Negative for joint swelling.  Skin: Negative for rash.  Neurological: Positive for weakness. Negative for dizziness, syncope, speech difficulty, numbness and headaches.  Hematological: Negative for adenopathy.  Psychiatric/Behavioral: Negative for agitation, behavioral problems and dysphoric mood. The patient is not nervous/anxious.        Objective:   Physical Exam  Constitutional: She is oriented to person, place, and  time. She appears well-developed and well-nourished.  Overweight Blood pressure controlled  HENT:  Head: Normocephalic.  Right Ear: External ear normal.  Left Ear: External ear normal.  Mouth/Throat: Oropharynx is clear and moist.  Eyes: Pupils are equal, round, and reactive to light. Conjunctivae and EOM are normal.  Neck: Normal range of motion. Neck supple. No thyromegaly present.  Cardiovascular: Normal rate, regular rhythm, normal heart sounds and intact distal pulses.   Pulmonary/Chest: Effort normal and breath sounds normal.  Abdominal: Soft. Bowel sounds are normal. She exhibits no mass. There is no tenderness.  Musculoskeletal: Normal range of motion. She exhibits edema.  Lymphadenopathy:    She has no cervical adenopathy.  Neurological: She is alert and oriented to person, place, and time.  Skin: Skin is warm and dry. No rash noted.  Bald Port-A-Cath left upper anterior chest wall   Psychiatric: She has a normal mood and affect. Her behavior is normal.          Assessment & Plan:   Metastatic right breast cancer with lymphatic and bony metastases Unsteady gait.  Prescription for a 4-point cane dispensed.  Application for disability parking placard dispensed Obesity OSA Essential hypertension    Oncology follow-up Low-salt diet Return here 6 months or as needed  [employer]

## 2016-12-23 NOTE — Patient Instructions (Signed)
Limit your sodium (Salt) intake  Oncology follow-up as scheduled  Gen. Surgery follow-up as scheduled

## 2016-12-24 ENCOUNTER — Other Ambulatory Visit (HOSPITAL_BASED_OUTPATIENT_CLINIC_OR_DEPARTMENT_OTHER): Payer: Medicare HMO

## 2016-12-24 ENCOUNTER — Encounter: Payer: Self-pay | Admitting: Adult Health

## 2016-12-24 ENCOUNTER — Ambulatory Visit (HOSPITAL_BASED_OUTPATIENT_CLINIC_OR_DEPARTMENT_OTHER): Payer: Medicare HMO | Admitting: Adult Health

## 2016-12-24 ENCOUNTER — Ambulatory Visit (HOSPITAL_BASED_OUTPATIENT_CLINIC_OR_DEPARTMENT_OTHER): Payer: Medicare HMO

## 2016-12-24 VITALS — BP 156/82 | HR 85 | Temp 97.7°F | Resp 17 | Ht 59.5 in | Wt 243.7 lb

## 2016-12-24 DIAGNOSIS — K7469 Other cirrhosis of liver: Secondary | ICD-10-CM

## 2016-12-24 DIAGNOSIS — C7951 Secondary malignant neoplasm of bone: Secondary | ICD-10-CM

## 2016-12-24 DIAGNOSIS — C50811 Malignant neoplasm of overlapping sites of right female breast: Secondary | ICD-10-CM | POA: Diagnosis not present

## 2016-12-24 DIAGNOSIS — C773 Secondary and unspecified malignant neoplasm of axilla and upper limb lymph nodes: Secondary | ICD-10-CM

## 2016-12-24 DIAGNOSIS — Z170 Estrogen receptor positive status [ER+]: Principal | ICD-10-CM

## 2016-12-24 DIAGNOSIS — Z452 Encounter for adjustment and management of vascular access device: Secondary | ICD-10-CM | POA: Diagnosis not present

## 2016-12-24 DIAGNOSIS — Z95828 Presence of other vascular implants and grafts: Secondary | ICD-10-CM

## 2016-12-24 DIAGNOSIS — Z6841 Body Mass Index (BMI) 40.0 and over, adult: Secondary | ICD-10-CM

## 2016-12-24 LAB — CBC WITH DIFFERENTIAL/PLATELET
BASO%: 1.8 % (ref 0.0–2.0)
Basophils Absolute: 0.1 10*3/uL (ref 0.0–0.1)
EOS%: 2 % (ref 0.0–7.0)
Eosinophils Absolute: 0.1 10*3/uL (ref 0.0–0.5)
HCT: 30.7 % — ABNORMAL LOW (ref 34.8–46.6)
HGB: 10.4 g/dL — ABNORMAL LOW (ref 11.6–15.9)
LYMPH%: 21.6 % (ref 14.0–49.7)
MCH: 34.1 pg — ABNORMAL HIGH (ref 25.1–34.0)
MCHC: 33.8 g/dL (ref 31.5–36.0)
MCV: 100.8 fL (ref 79.5–101.0)
MONO#: 0 10*3/uL — ABNORMAL LOW (ref 0.1–0.9)
MONO%: 1.1 % (ref 0.0–14.0)
NEUT#: 2.4 10*3/uL (ref 1.5–6.5)
NEUT%: 73.5 % (ref 38.4–76.8)
Platelets: 76 10*3/uL — ABNORMAL LOW (ref 145–400)
RBC: 3.04 10*6/uL — AB (ref 3.70–5.45)
RDW: 15.2 % — ABNORMAL HIGH (ref 11.2–14.5)
WBC: 3.2 10*3/uL — ABNORMAL LOW (ref 3.9–10.3)
lymph#: 0.7 10*3/uL — ABNORMAL LOW (ref 0.9–3.3)

## 2016-12-24 LAB — COMPREHENSIVE METABOLIC PANEL
ALT: 36 U/L (ref 0–55)
AST: 48 U/L — AB (ref 5–34)
Albumin: 3.1 g/dL — ABNORMAL LOW (ref 3.5–5.0)
Alkaline Phosphatase: 86 U/L (ref 40–150)
Anion Gap: 8 mEq/L (ref 3–11)
BUN: 13.1 mg/dL (ref 7.0–26.0)
CHLORIDE: 106 meq/L (ref 98–109)
CO2: 25 meq/L (ref 22–29)
CREATININE: 0.6 mg/dL (ref 0.6–1.1)
Calcium: 8.7 mg/dL (ref 8.4–10.4)
EGFR: 90 mL/min/{1.73_m2} — ABNORMAL LOW (ref >90)
Glucose: 107 mg/dl (ref 70–140)
Potassium: 3.7 mEq/L (ref 3.5–5.1)
Sodium: 139 mEq/L (ref 136–145)
Total Bilirubin: 0.68 mg/dL (ref 0.20–1.20)
Total Protein: 5.9 g/dL — ABNORMAL LOW (ref 6.4–8.3)

## 2016-12-24 MED ORDER — SODIUM CHLORIDE 0.9% FLUSH
10.0000 mL | Freq: Once | INTRAVENOUS | Status: AC
  Administered 2016-12-24: 10 mL
  Filled 2016-12-24: qty 10

## 2016-12-24 MED ORDER — HEPARIN SOD (PORK) LOCK FLUSH 100 UNIT/ML IV SOLN
500.0000 [IU] | Freq: Once | INTRAVENOUS | Status: AC
  Administered 2016-12-24: 500 [IU]
  Filled 2016-12-24: qty 5

## 2016-12-24 MED ORDER — FLUCONAZOLE 100 MG PO TABS: 200.0000 mg | ORAL_TABLET | Freq: Every day | ORAL | 1 refills | Status: DC

## 2016-12-24 NOTE — Progress Notes (Signed)
[facility]  Telephone:(336) [phone] Fax:(336) [phone]     ID: SIMCHA FARRINGTON DOB: 08-26-1945  MR#: [id number]  OJJ#:[id number]  Patient Care Team: Marletta Lor, MD as PCP - General Magrinat, Virgie Dad, MD as Consulting Physician (Oncology) Alphonsa Overall, MD as Consulting Physician (General Surgery) Kyung Rudd, MD as Consulting Physician (Radiation Oncology) Cristine Polio, MD as Consulting Physician (Plastic Surgery) Delice Bison, Charlestine Massed, NP as Nurse Practitioner (Hematology and Oncology) Scot Dock, NP OTHER MD:  CHIEF COMPLAINT: Locally advanced estrogen receptor positive breast cancer  CURRENT TREATMENT: Neoadjuvant chemotherapy  INTERVAL HISTORY: Makylah is here today for evaluation after receiving her third cycle of CMF. She tolerated it relatively well.    REVIEW OF SYSTEMS: Her nausea continues to be controlled with queasy pops.  She has some questions about her medications and wants me to send in a refill of Fluconazole to her pharmacy.  She is also requesting that we call [facility] and see if they have a cane she can get.  Otherwise, a detailed ROS was non contributory.   BREAST CANCER HISTORY: From the original intake note  Nakeshia herself noted a change in her right breast sometime in September 2017.Marland Kitchen She did not immediately bring it to medical attention. When she noted some significant changes in her right nipple she saw [name] and was set up for bilateral diagnostic mammography with tomography and [facility]. This found the breast density to be category B. In the upper and lower outer quadrants of the right breast there was a mass measuring at least 10 cm. There were also groups of heterogeneous calcifications measuring 3 cm and a separate group 0.4 cm. On physical exam there was a palpable firm mass measuring approximately 10 cm involving the upper outer and lower outer  quadrant of the right breast, with skin reaction and nipple retraction. There was no palpable right axillary adenopathy.  Right breast ultrasonography confirmed a large hypoechoic mass with irregular margins extending to the skin surface. The right axilla showed 2 lymph nodes which appeared abnormal.  In the left breast there were some indeterminate retroareolar calcifications which were felt to warrant biopsy. This was performed 10/08/2016 and showed a fibroadenoma ([id number] [id number]).  Biopsy of the right breast mass at the 7:00 and 10:00 positions as well as one of the suspicious lymph nodes all showed invasive ductal carcinoma, E-cadherin positive. Separate prognostic profiles from the 2 breast masses were sent. Estrogen receptor was positive at 95-70%, progesterone receptor was positive at 15-85%, all with strong staining intensity, the MIB-1 was 15-20%, and HER-2 was nonamplified, the signals ratio being 1.28-1.72 and the number per cell 1.85-3.95.  The patient's subsequent history is as detailed below   PAST MEDICAL HISTORY: Past Medical History:  Diagnosis Date  . Breast cancer ([place]) 10/07/2016   bilateral breast   . Owens Shark recluse spider bite    right leg  . COLONIC POLYPS, HX OF 01/05/2009   Qualifier: Diagnosis of  By: [name]  MD, [name]   . Complication of anesthesia    slow to awaken after wisdom  teeth extraction age 71  . Genetic testing 11/27/2016   Ms. Reach underwent genetic counseling and testing for hereditary cancer syndromes on 11/19/2016. Her results were negative for mutations in all 46 genes analyzed by [facility]'s 46-gene Common Hereditary Cancers Panel. Genes analyzed include: APC, ATM, AXIN2, BARD1, BMPR1A, BRCA1, BRCA2, BRIP1, CDH1, CDKN2A, CHEK2, CTNNA1, DICER1,  EPCAM, GREM1, HOXB13, KIT, MEN1, MLH1, MSH2, MSH3, MSH6, MUTYH, NBN,  . HYPOTHYROIDISM 02/01/2010   Qualifier: Diagnosis of  By: [name]  MD, [name]   . Morbid obesity ([place]) 01/05/2009   Qualifier:  Diagnosis of  By: [name]  MD, [name]   . Pneumonia 1987  . Sleep apnea    no cpap used     PAST SURGICAL HISTORY: Past Surgical History:  Procedure Laterality Date  . BREAST BIOPSY Right 10/07/2016   invasive mammary carcinoma  . BREAST BIOPSY Left 10/08/2016   left  breast fibroadenoma no malignancy  . CATARACT EXTRACTION Bilateral   . COLONOSCOPY WITH PROPOFOL N/A 12/22/2015   Procedure: COLONOSCOPY WITH PROPOFOL;  Surgeon: [name], MD;  [facility];  Service: Endoscopy;  Laterality: N/A;  . colonscopy  2012   with polyp removed  . MOLE REMOVAL  10/29/2016   Procedure: MOLE REMOVAL LEFT CHEST;  Surgeon: Alphonsa Overall, MD;  [facility];  Service: General;;  . PORTACATH PLACEMENT N/A 10/29/2016   Procedure: INSERTION PORT-A-CATH;  Surgeon: Alphonsa Overall, MD;  [facility];  Service: General;  Laterality: N/A;  . surgery for spider bite  1998  . TUBAL LIGATION    . WISDOM TOOTH EXTRACTION  age 71    FAMILY HISTORY Family History  Problem Relation Age of Onset  . Breast cancer Mother 53       d.52 from metastatic disease  . Throat cancer Father        d.65 history of smoking  . Rheum arthritis Sister        3 sisters pos. for osteo and RA  . Diabetes Maternal Aunt   . Cancer Maternal Grandfather        mouth cancer-chewed tobacco  . Breast cancer Cousin 34  . Breast cancer Daughter 7  . Stomach cancer Sister 58  The patient's father died at the age of 20 from laryngeal cancer in the setting of tobacco abuse. The patient's mother died at the age of 35 from metastatic breast cancer which had been diagnosed in her early 52s. The patient has 2 half-sisters and one half-brother. One of her sisters has "stomach" cancer. There is also a cousin on the maternal side with breast cancer diagnosed at age 48. No family member has been genetically tested yet  GYNECOLOGIC HISTORY:  No LMP recorded. Patient is postmenopausal. Menarche age 30, first live  birth age 34, she is [place] P5. She is status post bilateral tubal ligation. She stopped having periods in 1985. She tells me she did not use hormone replacement. However a note from her bone density scan December 2001 states "this patient began hormone replacement therapy 3 months ago"  SOCIAL HISTORY:  She is originally from [place]. 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 She has survived 2 husband's period at home currently is just she and her daughter Samanda Buske. She tells me this daughter had mild brain damage at birth and is not working, and cannot drive because of a history of seizures. However she is the one she is planning to name is her healthcare power of attorney. The patient has 13 grandchildren and 4 great-grandchildren. She attends a [employer]: At the 10/21/2016 visit the patient was given the appropriate documents to complete and notarize at her discretion   HEALTH MAINTENANCE: Social History  Substance Use Topics  . Smoking status: Never Smoker  . Smokeless tobacco: Never  Used  . Alcohol use No     Colonoscopy: 12/22/2015/[facility]  PAP:  Bone density: December 2001 was normal   Allergies  Allergen Reactions  . Bee Venom Anaphylaxis    Current Outpatient Prescriptions  Medication Sig Dispense Refill  . acetaminophen (TYLENOL) 500 MG tablet Take 500 mg by mouth daily as needed for moderate pain or headache.    . dexamethasone (DECADRON) 4 MG tablet Take 2 tablets (8 mg total) by mouth daily. Start the day after chemotherapy for 2 days. Take with food. 30 tablet 1  . EPINEPHrine (EPIPEN 2-PAK) 0.3 mg/0.3 mL SOAJ injection Inject 0.3 mLs (0.3 mg total) into the muscle once. 1 Device 2  . fluconazole (DIFLUCAN) 100 MG tablet Take 2 tablets (200 mg total) by mouth daily. Take for 10 days following treatment, every 21 days 70 tablet 1  . lidocaine-prilocaine (EMLA) cream Apply 1 application topically as needed. 30 g 0  .  magic mouthwash w/lidocaine SOLN Take 5 mLs by mouth 4 (four) times daily as needed for mouth pain. 240 mL 0  . promethazine (PHENERGAN) 25 MG suppository Place 1 suppository (25 mg total) rectally every 6 (six) hours as needed for nausea or vomiting. 12 each 4  . triamcinolone ointment (KENALOG) 0.5 % Apply 1 application topically 2 (two) times daily. 30 g 0   No current facility-administered medications for this visit.     OBJECTIVE:   Vitals:   12/24/16 1318  BP: (!) 156/82  Pulse: 85  Resp: 17  Temp: 97.7 F (36.5 C)     Body mass index is 48.4 kg/m.    ECOG FS:1 - Symptomatic but completely ambulatory   GENERAL: Patient is a well appearing female in no acute distress HEENT:  Sclerae anicteric.  Oropharynx clear and moist. No ulcerations or evidence of oropharyngeal candidiasis. Neck is supple.  NODES:  No cervical, supraclavicular, or axillary lymphadenopathy palpated.  BREAST EXAM: see below, deferred exam today LUNGS:  Clear to auscultation bilaterally.  No wheezes or rhonchi. HEART:  Regular rate and rhythm. No murmur appreciated. ABDOMEN:  Soft, nontender.  Positive, normoactive bowel sounds. No organomegaly palpated. MSK:  No focal spinal tenderness to palpation. Full range of motion bilaterally in the upper extremities. EXTREMITIES:  No peripheral edema.   SKIN:  Clear with no obvious rashes or skin changes. No nail dyscrasia. NEURO:  Nonfocal. Well oriented.  Appropriate affect.  Right breast 12/17/2016   Right breast 11/12/16     LAB RESULTS:  CMP     Component Value Date/Time   NA 139 12/24/2016 1227   K 3.7 12/24/2016 1227   CL 107 04/28/2015 1133   CO2 25 12/24/2016 1227   GLUCOSE 107 12/24/2016 1227   BUN 13.1 12/24/2016 1227   CREATININE 0.6 12/24/2016 1227   CALCIUM 8.7 12/24/2016 1227   PROT 5.9 (L) 12/24/2016 1227   ALBUMIN 3.1 (L) 12/24/2016 1227   AST 48 (H) 12/24/2016 1227   ALT 36 12/24/2016 1227   ALKPHOS 86 12/24/2016 1227   BILITOT  0.68 12/24/2016 1227   GFRNONAA 102.34 01/25/2010 0858   GFRAA  11/24/2008 1315    >60        The eGFR has been calculated using the MDRD equation. This calculation has not been validated in all clinical situations. eGFR's persistently <60 mL/min signify possible Chronic Kidney Disease.    No results found for: TOTALPROTELP, ALBUMINELP, A1GS, A2GS, BETS, BETA2SER, GAMS, MSPIKE, SPEI  No results found for: KPAFRELGTCHN, LAMBDASER, KAPLAMBRATIO  Lab Results  Component Value Date   WBC 3.2 (L) 12/24/2016   NEUTROABS 2.4 12/24/2016   HGB 10.4 (L) 12/24/2016   HCT 30.7 (L) 12/24/2016   MCV 100.8 12/24/2016   PLT 76 (L) 12/24/2016      Chemistry      Component Value Date/Time   NA 139 12/24/2016 1227   K 3.7 12/24/2016 1227   CL 107 04/28/2015 1133   CO2 25 12/24/2016 1227   BUN 13.1 12/24/2016 1227   CREATININE 0.6 12/24/2016 1227      Component Value Date/Time   CALCIUM 8.7 12/24/2016 1227   ALKPHOS 86 12/24/2016 1227   AST 48 (H) 12/24/2016 1227   ALT 36 12/24/2016 1227   BILITOT 0.68 12/24/2016 1227       No results found for: LABCA2  No components found for: [id number]  No results for input(s): INR in the last 168 hours.  Urinalysis    Component Value Date/Time   COLORURINE LT. YELLOW 01/25/2010 0858   APPEARANCEUR CLEAR 01/25/2010 0858   LABSPEC >=1.030 01/25/2010 0858   PHURINE 5.5 01/25/2010 0858   GLUCOSEU NEGATIVE 01/25/2010 0858   HGBUR 1+ 12/30/2008 0838   BILIRUBINUR Neg 04/28/2015 1122   KETONESUR 15 (?) 01/25/2010 0858   PROTEINUR Neg 04/28/2015 1122   PROTEINUR NEGATIVE 11/24/2008 1545   UROBILINOGEN 0.2 04/28/2015 1122   UROBILINOGEN 0.2 01/25/2010 0858   NITRITE Neg 04/28/2015 1122   NITRITE NEGATIVE 01/25/2010 0858   LEUKOCYTESUR small (1+) (A) 04/28/2015 1122     STUDIES: No results found.  ELIGIBLE FOR AVAILABLE RESEARCH PROTOCOL: no  ASSESSMENT: 71 y.o. 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 There was a 4 cm right breast mass with right axillary lymph nodes and evidence of cirrhosis.  (3) neoadjuvant treatment to consist of cyclophosphamide, methotrexate, and fluorouracil (CMF) chemotherapy every 21 days 8, starting 11/05/2016  (4) definitive surgery to follow  (5) adjuvant radiation to follow surgery  (6) anti-estrogen therapy to follow at the completion of local treatment  PLAN: Deetya is doing well today.  She tolerated chemotherapy well again.  Her labs are normal today.  Her anc is 2.4.  I sent in some additional diflucan to her pharmacy.  She knows to call for anything else she may need prior to her next appt on 01/07/2017  A total of (20) minutes of face-to-face time was spent with this patient with greater than 50% of that time in counseling and care-coordination.    Scot Dock, NP   12/24/2016 2:50 PM Medical Oncology and Hematology [facility] [address] [place], [place] [address] Tel. [phone]    Fax. [phone]

## 2016-12-24 NOTE — Patient Instructions (Signed)

## 2017-01-07 ENCOUNTER — Ambulatory Visit: Payer: Medicare HMO

## 2017-01-07 ENCOUNTER — Ambulatory Visit (HOSPITAL_BASED_OUTPATIENT_CLINIC_OR_DEPARTMENT_OTHER): Payer: Medicare HMO

## 2017-01-07 ENCOUNTER — Telehealth: Payer: Self-pay

## 2017-01-07 ENCOUNTER — Ambulatory Visit (HOSPITAL_BASED_OUTPATIENT_CLINIC_OR_DEPARTMENT_OTHER): Payer: Medicare HMO | Admitting: Oncology

## 2017-01-07 ENCOUNTER — Other Ambulatory Visit (HOSPITAL_BASED_OUTPATIENT_CLINIC_OR_DEPARTMENT_OTHER): Payer: Medicare HMO

## 2017-01-07 VITALS — BP 130/82 | HR 98 | Temp 98.5°F | Resp 18 | Ht 59.5 in | Wt 240.5 lb

## 2017-01-07 DIAGNOSIS — Z170 Estrogen receptor positive status [ER+]: Secondary | ICD-10-CM

## 2017-01-07 DIAGNOSIS — C50811 Malignant neoplasm of overlapping sites of right female breast: Secondary | ICD-10-CM

## 2017-01-07 DIAGNOSIS — C7951 Secondary malignant neoplasm of bone: Secondary | ICD-10-CM

## 2017-01-07 DIAGNOSIS — Z5111 Encounter for antineoplastic chemotherapy: Secondary | ICD-10-CM

## 2017-01-07 DIAGNOSIS — K7469 Other cirrhosis of liver: Secondary | ICD-10-CM

## 2017-01-07 DIAGNOSIS — Z95828 Presence of other vascular implants and grafts: Secondary | ICD-10-CM

## 2017-01-07 DIAGNOSIS — Z6841 Body Mass Index (BMI) 40.0 and over, adult: Secondary | ICD-10-CM

## 2017-01-07 LAB — CBC WITH DIFFERENTIAL/PLATELET
BASO%: 0.5 % (ref 0.0–2.0)
BASOS ABS: 0 10*3/uL (ref 0.0–0.1)
EOS ABS: 0.2 10*3/uL (ref 0.0–0.5)
EOS%: 4 % (ref 0.0–7.0)
HCT: 35.2 % (ref 34.8–46.6)
HGB: 11.7 g/dL (ref 11.6–15.9)
LYMPH%: 24.1 % (ref 14.0–49.7)
MCH: 34.3 pg — AB (ref 25.1–34.0)
MCHC: 33.2 g/dL (ref 31.5–36.0)
MCV: 103.2 fL — AB (ref 79.5–101.0)
MONO#: 0.8 10*3/uL (ref 0.1–0.9)
MONO%: 19.2 % — AB (ref 0.0–14.0)
NEUT#: 2.2 10*3/uL (ref 1.5–6.5)
NEUT%: 52.2 % (ref 38.4–76.8)
NRBC: 0 % (ref 0–0)
PLATELETS: 190 10*3/uL (ref 145–400)
RBC: 3.41 10*6/uL — AB (ref 3.70–5.45)
RDW: 15.3 % — ABNORMAL HIGH (ref 11.2–14.5)
WBC: 4.3 10*3/uL (ref 3.9–10.3)
lymph#: 1 10*3/uL (ref 0.9–3.3)

## 2017-01-07 LAB — COMPREHENSIVE METABOLIC PANEL
ALT: 50 U/L (ref 0–55)
ANION GAP: 8 meq/L (ref 3–11)
AST: 70 U/L — ABNORMAL HIGH (ref 5–34)
Albumin: 3.2 g/dL — ABNORMAL LOW (ref 3.5–5.0)
Alkaline Phosphatase: 84 U/L (ref 40–150)
BILIRUBIN TOTAL: 0.56 mg/dL (ref 0.20–1.20)
BUN: 11.2 mg/dL (ref 7.0–26.0)
CO2: 25 meq/L (ref 22–29)
Calcium: 9.5 mg/dL (ref 8.4–10.4)
Chloride: 107 mEq/L (ref 98–109)
Creatinine: 0.8 mg/dL (ref 0.6–1.1)
EGFR: 77 mL/min/{1.73_m2} — AB (ref >90)
Glucose: 127 mg/dl (ref 70–140)
POTASSIUM: 4 meq/L (ref 3.5–5.1)
Sodium: 140 mEq/L (ref 136–145)
TOTAL PROTEIN: 6.7 g/dL (ref 6.4–8.3)

## 2017-01-07 LAB — TECHNOLOGIST REVIEW

## 2017-01-07 MED ORDER — HEPARIN SOD (PORK) LOCK FLUSH 100 UNIT/ML IV SOLN
500.0000 [IU] | Freq: Once | INTRAVENOUS | Status: AC | PRN
  Administered 2017-01-07: 500 [IU]
  Filled 2017-01-07: qty 5

## 2017-01-07 MED ORDER — DEXAMETHASONE SODIUM PHOSPHATE 10 MG/ML IJ SOLN
10.0000 mg | Freq: Once | INTRAMUSCULAR | Status: AC
  Administered 2017-01-07: 10 mg via INTRAVENOUS

## 2017-01-07 MED ORDER — METHOTREXATE SODIUM (PF) CHEMO INJECTION 250 MG/10ML
39.0000 mg/m2 | Freq: Once | INTRAMUSCULAR | Status: AC
  Administered 2017-01-07: 85 mg via INTRAVENOUS
  Filled 2017-01-07: qty 3.4

## 2017-01-07 MED ORDER — PALONOSETRON HCL INJECTION 0.25 MG/5ML
0.2500 mg | Freq: Once | INTRAVENOUS | Status: AC
  Administered 2017-01-07: 0.25 mg via INTRAVENOUS

## 2017-01-07 MED ORDER — DEXAMETHASONE SODIUM PHOSPHATE 10 MG/ML IJ SOLN
INTRAMUSCULAR | Status: AC
  Filled 2017-01-07: qty 1

## 2017-01-07 MED ORDER — SODIUM CHLORIDE 0.9% FLUSH
10.0000 mL | INTRAVENOUS | Status: DC | PRN
  Administered 2017-01-07: 10 mL
  Filled 2017-01-07: qty 10

## 2017-01-07 MED ORDER — FLUOROURACIL CHEMO INJECTION 2.5 GM/50ML
600.0000 mg/m2 | Freq: Once | INTRAVENOUS | Status: AC
  Administered 2017-01-07: 1300 mg via INTRAVENOUS
  Filled 2017-01-07: qty 26

## 2017-01-07 MED ORDER — SODIUM CHLORIDE 0.9 % IV SOLN
Freq: Once | INTRAVENOUS | Status: AC
  Administered 2017-01-07: 10:00:00 via INTRAVENOUS

## 2017-01-07 MED ORDER — SODIUM CHLORIDE 0.9% FLUSH
10.0000 mL | Freq: Once | INTRAVENOUS | Status: AC
  Administered 2017-01-07: 10 mL
  Filled 2017-01-07: qty 10

## 2017-01-07 MED ORDER — PALONOSETRON HCL INJECTION 0.25 MG/5ML
INTRAVENOUS | Status: AC
  Filled 2017-01-07: qty 5

## 2017-01-07 MED ORDER — SODIUM CHLORIDE 0.9 % IV SOLN
600.0000 mg/m2 | Freq: Once | INTRAVENOUS | Status: AC
  Administered 2017-01-07: 1300 mg via INTRAVENOUS
  Filled 2017-01-07: qty 65

## 2017-01-07 NOTE — Progress Notes (Signed)
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 Today is day 1 cycle 4 of 6 planned cycles of CMF.  She is generally doing very well with treatments. She says she feels a little bit tired. She has tolerated the anti-emetics well and has not had significant problems with nausea. She is taking Diflucan with each cycle and that has prevented recurrence of the thrush. Overall she feels she is doing "terrific: I am your miracle patient".  REVIEW OF SYSTEMS: She tells me she does not want to see the APP. There were some disagreements there that were not clear to me. She once said to have a list of all her doctors and make sure they're all in her insurance list. As far as her now they are but she is going to be interested in seeing a Psychiatric nurse and the once so previously Dr. Towanda Malkin is not on that list. She also brought me a handicap sticker decide which I was glad to do for her. In general a detailed review of systems today was stable  BREAST CANCER HISTORY: From the original intake note  Levonne herself noted a change in her right breast sometime in September 2017.Marland Kitchen She did not immediately bring it to medical attention. When she noted some significant changes  in her right nipple she saw [name] and was set up for bilateral diagnostic mammography with tomography and [facility] Oct [facility] 2016. This found the breast density to be category B. In the upper and lower outer quadrants of the right breast there was a mass measuring at least 10 cm. There were also groups of heterogeneous calcifications measuring 3 cm and a separate group 0.4 cm. On physical exam there was a palpable firm mass measuring approximately 10 cm involving the upper outer and lower outer quadrant of the right breast, with skin reaction and nipple retraction. There was no palpable right axillary adenopathy.  Right breast ultrasonography confirmed a large hypoechoic mass with irregular margins extending to the skin surface. The right axilla showed 2 lymph nodes which appeared abnormal.  In the left breast there were some indeterminate retroareolar calcifications which were felt to warrant biopsy. This was performed 10/08/2016 and showed a fibroadenoma ([id number]).  Biopsy of the right breast mass at the 7:00 and 10:00 positions as well as one of the suspicious lymph nodes all showed invasive ductal carcinoma, E-cadherin positive. Separate prognostic profiles from the 2 breast masses were sent. Estrogen receptor was positive at 95-70%, progesterone receptor was positive at 15-85%, all with strong staining intensity, the MIB-1 was 15-20%, and HER-2 was nonamplified, the signals ratio being 1.28-1.72 and the number per cell 1.85-3.95.  The patient's subsequent history is as detailed below   PAST MEDICAL HISTORY: Past Medical History:  Diagnosis Date  . Breast cancer ([place]) 10/07/2016   bilateral breast   .  Brown recluse spider bite    right leg  . COLONIC POLYPS, HX OF 01/05/2009   Qualifier: Diagnosis of  By: [name]  MD, [name]   . Complication of anesthesia    slow to awaken after wisdom  teeth extraction age 52  . Genetic testing  11/27/2016   Ms. Grayer underwent genetic counseling and testing for hereditary cancer syndromes on 11/19/2016. Her results were negative for mutations in all 46 genes analyzed by [facility]'s 46-gene Common Hereditary Cancers Panel. Genes analyzed include: APC, ATM, AXIN2, BARD1, BMPR1A, BRCA1, BRCA2, BRIP1, CDH1, CDKN2A, CHEK2, CTNNA1, DICER1, EPCAM, GREM1, HOXB13, KIT, MEN1, MLH1, MSH2, MSH3, MSH6, MUTYH, NBN,  . HYPOTHYROIDISM 02/01/2010   Qualifier: Diagnosis of  By: [name]  MD, [name]   . Morbid obesity ([place]) 01/05/2009   Qualifier: Diagnosis of  By: [name]  MD, [name]   . Pneumonia 1987  . Sleep apnea    no cpap used     PAST SURGICAL HISTORY: Past Surgical History:  Procedure Laterality Date  . BREAST BIOPSY Right 10/07/2016   invasive mammary carcinoma  . BREAST BIOPSY Left 10/08/2016   left  breast fibroadenoma no malignancy  . CATARACT EXTRACTION Bilateral   . COLONOSCOPY WITH PROPOFOL N/A 12/22/2015   Procedure: COLONOSCOPY WITH PROPOFOL;  Surgeon: [name], MD;  Location: [facility];  Service: Endoscopy;  Laterality: N/A;  . colonscopy  2012   with polyp removed  . MOLE REMOVAL  10/29/2016   Procedure: MOLE REMOVAL LEFT CHEST;  Surgeon: Alphonsa Overall, MD;  Location: [facility];  Service: General;;  . PORTACATH PLACEMENT N/A 10/29/2016   Procedure: INSERTION PORT-A-CATH;  Surgeon: Alphonsa Overall, MD;  Location: [facility];  Service: General;  Laterality: N/A;  . surgery for spider bite  1998  . TUBAL LIGATION    . WISDOM TOOTH EXTRACTION  age 88    FAMILY HISTORY Family History  Problem Relation Age of Onset  . Breast cancer Mother 33       d.52 from metastatic disease  . Throat cancer Father        d.65 history of smoking  . Rheum arthritis Sister        3 sisters pos. for osteo and RA  . Diabetes Maternal Aunt   . Cancer Maternal Grandfather        mouth cancer-chewed tobacco  . Breast cancer Cousin 60  . Breast cancer Daughter 59  . Stomach cancer  Sister 57  The patient's father died at the age of 89 from laryngeal cancer in the setting of tobacco abuse. The patient's mother died at the age of 39 from metastatic breast cancer which had been diagnosed in her early 41s. The patient has 2 half-sisters and one half-brother. One of her sisters has "stomach" cancer. There is also a cousin on the maternal side with breast cancer diagnosed at age 53. No family member has been genetically tested yet  GYNECOLOGIC HISTORY:  No LMP recorded. Patient is postmenopausal. Menarche age 28, first live birth age 40, she is [place] P5. She is status post bilateral tubal ligation. She stopped having periods in 1985. She tells me she did not use hormone replacement. However a note from her bone density scan December 2001 states "this patient began hormone replacement therapy 3 months ago"  SOCIAL HISTORY:  She is originally from [place]. 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 She has survived 2 husband's period at home currently is just she and her  daughter Caroly Purewal. She tells me this daughter had mild brain damage at birth and is not working, and cannot drive because of a history of seizures. However she is the one she is planning to name is her healthcare power of attorney. The patient has 13 grandchildren and 4 great-grandchildren. She attends a [employer]: At the 10/21/2016 visit the patient was given the appropriate documents to complete and notarize at her discretion   HEALTH MAINTENANCE: Social History  Substance Use Topics  . Smoking status: Never Smoker  . Smokeless tobacco: Never Used  . Alcohol use No     Colonoscopy: 12/22/2015/Nandigam  PAP:  Bone density: December 2001 was normal   Allergies  Allergen Reactions  . Bee Venom Anaphylaxis    Current Outpatient Prescriptions  Medication Sig Dispense Refill  . acetaminophen (TYLENOL) 500 MG tablet Take 500 mg by mouth daily as needed  for moderate pain or headache.    . dexamethasone (DECADRON) 4 MG tablet Take 2 tablets (8 mg total) by mouth daily. Start the day after chemotherapy for 2 days. Take with food. 30 tablet 1  . EPINEPHrine (EPIPEN 2-PAK) 0.3 mg/0.3 mL SOAJ injection Inject 0.3 mLs (0.3 mg total) into the muscle once. 1 Device 2  . fluconazole (DIFLUCAN) 100 MG tablet Take 2 tablets (200 mg total) by mouth daily. Take for 10 days following treatment, every 21 days 70 tablet 1  . lidocaine-prilocaine (EMLA) cream Apply 1 application topically as needed. 30 g 0  . magic mouthwash w/lidocaine SOLN Take 5 mLs by mouth 4 (four) times daily as needed for mouth pain. 240 mL 0  . promethazine (PHENERGAN) 25 MG suppository Place 1 suppository (25 mg total) rectally every 6 (six) hours as needed for nausea or vomiting. 12 each 4  . triamcinolone ointment (KENALOG) 0.5 % Apply 1 application topically 2 (two) times daily. 30 g 0   No current facility-administered medications for this visit.     OBJECTIVE: Older white woman who appears stated age  71:   01/07/17 0921  BP: 130/82  Pulse: 98  Resp: 18  Temp: 98.5 F (36.9 C)  SpO2: 97%     Body mass index is 47.76 kg/m.    ECOG FS:1 - Symptomatic but completely ambulatory   Sclerae unicteric, EOMs intact Oropharynx clear and moist No cervical or supraclavicular adenopathy Lungs no rales or rhonchi Heart regular rate and rhythm Abd soft, obese, nontender, positive bowel sounds MSK no focal spinal tenderness, no upper extremity lymphedema Neuro: nonfocal, well oriented, appropriate affect Breasts: Deferred   Right breast 12/17/2016   Right breast 11/12/16     LAB RESULTS:  CMP     Component Value Date/Time   NA 139 12/24/2016 1227   K 3.7 12/24/2016 1227   CL 107 04/28/2015 1133   CO2 25 12/24/2016 1227   GLUCOSE 107 12/24/2016 1227   BUN 13.1 12/24/2016 1227   CREATININE 0.6 12/24/2016 1227   CALCIUM 8.7 12/24/2016 1227   PROT 5.9 (L)  12/24/2016 1227   ALBUMIN 3.1 (L) 12/24/2016 1227   AST 48 (H) 12/24/2016 1227   ALT 36 12/24/2016 1227   ALKPHOS 86 12/24/2016 1227   BILITOT 0.68 12/24/2016 1227   GFRNONAA 102.34 01/25/2010 0858   GFRAA  11/24/2008 1315    >60        The eGFR has been calculated using the MDRD equation. This calculation has not been validated in all clinical situations. eGFR's  persistently <60 mL/min signify possible Chronic Kidney Disease.    No results found for: TOTALPROTELP, ALBUMINELP, A1GS, A2GS, BETS, BETA2SER, GAMS, MSPIKE, SPEI  No results found for: Nils Pyle, [employer] [facility]  Lab Results  Component Value Date   WBC 4.3 01/07/2017   NEUTROABS 2.2 01/07/2017   HGB 11.7 01/07/2017   HCT 35.2 01/07/2017   MCV 103.2 (H) 01/07/2017   PLT 190 01/07/2017      Chemistry      Component Value Date/Time   NA 139 12/24/2016 1227   K 3.7 12/24/2016 1227   CL 107 04/28/2015 1133   CO2 25 12/24/2016 1227   BUN 13.1 12/24/2016 1227   CREATININE 0.6 12/24/2016 1227      Component Value Date/Time   CALCIUM 8.7 12/24/2016 1227   ALKPHOS 86 12/24/2016 1227   AST 48 (H) 12/24/2016 1227   ALT 36 12/24/2016 1227   BILITOT 0.68 12/24/2016 1227       No results found for: LABCA2  No components found for: CVELFY101  No results for input(s): INR in the last 168 hours.  Urinalysis    Component Value Date/Time   COLORURINE LT. YELLOW 01/25/2010 0858   APPEARANCEUR CLEAR 01/25/2010 0858   LABSPEC >=1.030 01/25/2010 0858   PHURINE 5.5 01/25/2010 0858   GLUCOSEU NEGATIVE 01/25/2010 0858   HGBUR 1+ 12/30/2008 0838   BILIRUBINUR Neg 04/28/2015 1122   KETONESUR 15 (?) 01/25/2010 0858   PROTEINUR Neg 04/28/2015 1122   PROTEINUR NEGATIVE 11/24/2008 1545   UROBILINOGEN 0.2 04/28/2015 1122   UROBILINOGEN 0.2 01/25/2010 0858   NITRITE Neg 04/28/2015 1122   NITRITE NEGATIVE 01/25/2010 0858   LEUKOCYTESUR small (1+) (A) 04/28/2015 1122     STUDIES: No results  found.  ELIGIBLE FOR AVAILABLE RESEARCH PROTOCOL: no  ASSESSMENT: 71 y.o. 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 Results are negative for pathogenic mutations in 46 genes analyzed by [facility]'s Common Hereditary Cancers Panel. Results are dated 11/26/2016. Genes tested: APC, ATM, AXIN2, BARD1, BMPR1A, BRCA1, BRCA2, BRIP1, CDH1, CDKN2A, CHEK2, CTNNA1, DICER1, EPCAM, GREM1, HOXB13, KIT, MEN1, MLH1, MSH2, MSH3, MSH6, MUTYH, NBN, NF1, NTHL1, PALB2, PDGFRA, PMS2, POLD1, POLE, PTEN, RAD50, RAD51C, RAD51D, SDHA, SDHB, SDHC, SDHD, SMAD4, SMARCA4, STK11, TP53, TSC1, TSC2, and VHL.  (a) A variant of uncertain significance (not clinically actionable) was noted in CTNNA1.    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 There was a 4 cm right breast mass with right axillary lymph nodes and evidence of cirrhosis.  (3) neoadjuvant treatment to consist of cyclophosphamide, methotrexate, and fluorouracil (CMF) chemotherapy every 21 days 8, starting 11/05/2016  (4) definitive surgery to follow  (5) adjuvant radiation to follow surgery  (6) anti-estrogen therapy to follow at the completion of local treatment  PLAN: Amiyrah is ready to proceed to her fourth cycle of CMF chemotherapy. She has tolerated this generally quite well. She is beginning to make her longer-term plans and would like to be referred to a Psychiatric nurse.  However she would  like to make sure the plastic  surgeon is in her service group. She is going to check with her insurance to see if the name I gave her is in that.  She will return in 3 weeks, to see me and for cycle #5 and at that point we will send her back to surgeon and plastic surgeon so they can start planning the definitive surgery.  She needs to have at least one tooth extracted so we are not starting denosumab at this point. I have urged her to get her teeth done so at some point we can start treating her a bone problems.  She knows to call for any issues that may develop before her next visit here.Marland Kitchen    Chauncey Cruel, MD   01/07/2017 9:33 AM Medical Oncology and Hematology [facility] [address] [place], [place] [address] Tel. [phone]    Fax. [phone]

## 2017-01-07 NOTE — Patient Instructions (Signed)
[facility] Discharge Instructions for Patients Receiving Chemotherapy  Today you received the following chemotherapy agents Cytoxan, 5-FU, and Methotrexate  To help prevent nausea and vomiting after your treatment, we encourage you to take your nausea medication as directed   If you develop nausea and vomiting that is not controlled by your nausea medication, call the clinic.   BELOW ARE SYMPTOMS THAT SHOULD BE REPORTED IMMEDIATELY:  *FEVER GREATER THAN 100.5 F  *CHILLS WITH OR WITHOUT FEVER  NAUSEA AND VOMITING THAT IS NOT CONTROLLED WITH YOUR NAUSEA MEDICATION  *UNUSUAL SHORTNESS OF BREATH  *UNUSUAL BRUISING OR BLEEDING  TENDERNESS IN MOUTH AND THROAT WITH OR WITHOUT PRESENCE OF ULCERS  *URINARY PROBLEMS  *BOWEL PROBLEMS  UNUSUAL RASH Items with * indicate a potential emergency and should be followed up as soon as possible.  Feel free to call the clinic you have any questions or concerns. The clinic phone number is [phone].  Please show the [place] at check-in to the Emergency Department and triage nurse.

## 2017-01-07 NOTE — Telephone Encounter (Signed)
lvm informing pt to call Dr Enrique Sack, DDS for her needed dental extractions per Dr [name]

## 2017-01-08 ENCOUNTER — Telehealth: Payer: Self-pay | Admitting: *Deleted

## 2017-01-08 DIAGNOSIS — Z95828 Presence of other vascular implants and grafts: Secondary | ICD-10-CM

## 2017-01-08 DIAGNOSIS — C50811 Malignant neoplasm of overlapping sites of right female breast: Secondary | ICD-10-CM

## 2017-01-08 DIAGNOSIS — Z170 Estrogen receptor positive status [ER+]: Principal | ICD-10-CM

## 2017-01-08 DIAGNOSIS — C7951 Secondary malignant neoplasm of bone: Secondary | ICD-10-CM

## 2017-01-08 NOTE — Telephone Encounter (Signed)
This RN placed a referral order for the dental clinic per pt's call stating need for extractions but unable to find a dentist who is willing to provide care due to her currently undergoing chemotherapy.  Note pt needs to obtain extractions so she can proceed with Delton See therapy for her known bone mets from her breast cancer.

## 2017-01-09 ENCOUNTER — Ambulatory Visit (HOSPITAL_COMMUNITY): Payer: Self-pay | Admitting: Dentistry

## 2017-01-09 ENCOUNTER — Encounter (HOSPITAL_COMMUNITY): Payer: Self-pay | Admitting: Dentistry

## 2017-01-09 VITALS — BP 151/77 | HR 68 | Temp 97.9°F

## 2017-01-09 DIAGNOSIS — K083 Retained dental root: Secondary | ICD-10-CM

## 2017-01-09 DIAGNOSIS — K08409 Partial loss of teeth, unspecified cause, unspecified class: Secondary | ICD-10-CM

## 2017-01-09 DIAGNOSIS — C7951 Secondary malignant neoplasm of bone: Secondary | ICD-10-CM

## 2017-01-09 DIAGNOSIS — K029 Dental caries, unspecified: Secondary | ICD-10-CM

## 2017-01-09 DIAGNOSIS — K030 Excessive attrition of teeth: Secondary | ICD-10-CM

## 2017-01-09 DIAGNOSIS — K0401 Reversible pulpitis: Secondary | ICD-10-CM

## 2017-01-09 DIAGNOSIS — C50819 Malignant neoplasm of overlapping sites of unspecified female breast: Secondary | ICD-10-CM | POA: Diagnosis not present

## 2017-01-09 DIAGNOSIS — C50811 Malignant neoplasm of overlapping sites of right female breast: Secondary | ICD-10-CM

## 2017-01-09 DIAGNOSIS — Z01818 Encounter for other preprocedural examination: Secondary | ICD-10-CM

## 2017-01-09 DIAGNOSIS — K06010 Localized gingival recession, unspecified: Secondary | ICD-10-CM

## 2017-01-09 DIAGNOSIS — M264 Malocclusion, unspecified: Secondary | ICD-10-CM

## 2017-01-09 DIAGNOSIS — K031 Abrasion of teeth: Secondary | ICD-10-CM

## 2017-01-09 DIAGNOSIS — R682 Dry mouth, unspecified: Secondary | ICD-10-CM

## 2017-01-09 DIAGNOSIS — Z170 Estrogen receptor positive status [ER+]: Principal | ICD-10-CM

## 2017-01-09 DIAGNOSIS — K117 Disturbances of salivary secretion: Secondary | ICD-10-CM

## 2017-01-09 DIAGNOSIS — K0530 Chronic periodontitis, unspecified: Secondary | ICD-10-CM

## 2017-01-09 DIAGNOSIS — K036 Deposits [accretions] on teeth: Secondary | ICD-10-CM

## 2017-01-09 NOTE — Progress Notes (Signed)
DENTAL CONSULTATION  Date of Consultation:  01/09/2017 Patient Name:   Jasmine Hood Date of Birth:   07/21/1945 Medical Record Number: [id number]  VITALS: BP (!) 151/77 (BP Location: Left Arm)   Pulse 68   Temp 97.9 F (36.6 C) (Oral)   CHIEF COMPLAINT: Patient referred by Dr. [name] for a dental consultation.  HPI: Jasmine Hood is a 71 year old female recently diagnosed with breast cancer with bone metastases. Patient referred by Dr. [name] for a pre-Xgeva therapy dental protocol examination. Patient subsequently found to have received 2 doses of Xgeva therapy on 11/19/2016 and 12/17/2016. Patient now seen for comprehensive dental examination prior to further doses of the Xgeva therapy and risk for osteonecrosis of the jaw related to the [facility] therapy.  The patient is currently complaining of toothache symptoms coming from the lower left molar #19 as well as lower right molar #30. Patient describes intermittent, sharp pain coming from these teeth. The pain last for seconds to minutes at a time. Patient denies any spontaneous pain at night. Patient indicates that the teeth hurt at an intensity of 10 out of 10 when they hurt, but currently is 0 out of 10 today. This pain has been occurring over the past several months but more acutely over the last 2 weeks. The patient last saw a dentist approximately 5 years ago for an exam and cleaning. This was with her cousin who is a Pharmacist, community in [place]. Patient has not seen a dentist for regular dental care since she lost her insurance upon retirement with [employer]. The patient denies having dental phobia.  PROBLEM LIST: Patient Active Problem List   Diagnosis Date Noted  . Bone metastases ([place]) 11/11/2016    Priority: High  . Malignant neoplasm of overlapping sites of right breast in female, estrogen receptor positive ([place]) 10/21/2016    Priority: High  . Genetic testing 11/27/2016  . Port catheter in place 11/14/2016  . Morbid  obesity with BMI of 50.0-59.9, adult ([place]) 11/05/2016  . Cirrhosis of liver ([place]) 11/05/2016  . Encounter for follow-up surveillance of colon cancer   . Benign neoplasm of ascending colon   . Benign neoplasm of descending colon   . Benign neoplasm of transverse colon   . Benign neoplasm of cecum   . OSA (obstructive sleep apnea) 09/14/2012  . Contact dermatitis and eczema due to plant 12/31/2011  . Hypothyroidism 02/01/2010  . Essential hypertension 01/05/2009  . History of colonic polyps 01/05/2009    PMH: Past Medical History:  Diagnosis Date  . Breast cancer ([place]) 10/07/2016   bilateral breast   . [place] Shark recluse spider bite    right leg  . COLONIC POLYPS, HX OF 01/05/2009   Qualifier: Diagnosis of  By: [name]  MD, [name]   . Complication of anesthesia    slow to awaken after wisdom  teeth extraction age 65  . Genetic testing 11/27/2016   Ms. Buttacavoli underwent genetic counseling and testing for hereditary cancer syndromes on 11/19/2016. Her results were negative for mutations in all 46 genes analyzed by [facility]'s 46-gene Common Hereditary Cancers Panel. Genes analyzed include: APC, ATM, AXIN2, BARD1, BMPR1A, BRCA1, BRCA2, BRIP1, CDH1, CDKN2A, CHEK2, CTNNA1, DICER1, EPCAM, GREM1, HOXB13, KIT, MEN1, MLH1, MSH2, MSH3, MSH6, MUTYH, NBN,  . HYPOTHYROIDISM 02/01/2010   Qualifier: Diagnosis of  By: [name]  MD, [name]   . Morbid obesity ([place]) 01/05/2009   Qualifier: Diagnosis of  By: [name]  MD, [name]   . Pneumonia 1987  .  Sleep apnea    no cpap used     PSH: Past Surgical History:  Procedure Laterality Date  . BREAST BIOPSY Right 10/07/2016   invasive mammary carcinoma  . BREAST BIOPSY Left 10/08/2016   left  breast fibroadenoma no malignancy  . CATARACT EXTRACTION Bilateral   . COLONOSCOPY WITH PROPOFOL N/A 12/22/2015   Procedure: COLONOSCOPY WITH PROPOFOL;  Surgeon: [name], MD;  Location: [facility] ENDOSCOPY;  Service: Endoscopy;  Laterality: N/A;  .  colonscopy  2012   with polyp removed  . MOLE REMOVAL  10/29/2016   Procedure: MOLE REMOVAL LEFT CHEST;  Surgeon: Alphonsa Overall, MD;  Location: [facility] ORS;  Service: General;;  . PORTACATH PLACEMENT N/A 10/29/2016   Procedure: INSERTION PORT-A-CATH;  Surgeon: Alphonsa Overall, MD;  Location: [facility] ORS;  Service: General;  Laterality: N/A;  . surgery for spider bite  1998  . TUBAL LIGATION    . WISDOM TOOTH EXTRACTION  age 73    ALLERGIES: Allergies  Allergen Reactions  . Bee Venom Anaphylaxis    MEDICATIONS: Current Outpatient Prescriptions  Medication Sig Dispense Refill  . acetaminophen (TYLENOL) 500 MG tablet Take 500 mg by mouth daily as needed for moderate pain or headache.    . dexamethasone (DECADRON) 4 MG tablet Take 2 tablets (8 mg total) by mouth daily. Start the day after chemotherapy for 2 days. Take with food. 30 tablet 1  . EPINEPHrine (EPIPEN 2-PAK) 0.3 mg/0.3 mL SOAJ injection Inject 0.3 mLs (0.3 mg total) into the muscle once. 1 Device 2  . fluconazole (DIFLUCAN) 100 MG tablet Take 2 tablets (200 mg total) by mouth daily. Take for 10 days following treatment, every 21 days 70 tablet 1  . lidocaine-prilocaine (EMLA) cream Apply 1 application topically as needed. 30 g 0  . magic mouthwash w/lidocaine SOLN Take 5 mLs by mouth 4 (four) times daily as needed for mouth pain. 240 mL 0  . promethazine (PHENERGAN) 25 MG suppository Place 1 suppository (25 mg total) rectally every 6 (six) hours as needed for nausea or vomiting. 12 each 4  . triamcinolone ointment (KENALOG) 0.5 % Apply 1 application topically 2 (two) times daily. 30 g 0  . valACYclovir (VALTREX) 500 MG tablet Take 500 mg by mouth 2 (two) times daily.     No current facility-administered medications for this visit.     LABS: Lab Results  Component Value Date   WBC 4.3 01/07/2017   HGB 11.7 01/07/2017   HCT 35.2 01/07/2017   MCV 103.2 (H) 01/07/2017   PLT 190 01/07/2017      Component Value Date/Time   NA 140  01/07/2017 0849   K 4.0 01/07/2017 0849   CL 107 04/28/2015 1133   CO2 25 01/07/2017 0849   GLUCOSE 127 01/07/2017 0849   BUN 11.2 01/07/2017 0849   CREATININE 0.8 01/07/2017 0849   CALCIUM 9.5 01/07/2017 0849   GFRNONAA 102.34 01/25/2010 0858   GFRAA  11/24/2008 1315    >60        The eGFR has been calculated using the MDRD equation. This calculation has not been validated in all clinical situations. eGFR's persistently <60 mL/min signify possible Chronic Kidney Disease.   No results found for: INR, PROTIME No results found for: PTT  SOCIAL HISTORY: Social History   Social History  . Marital status: Married    Spouse name: N/A  . Number of children: 5  . Years of education: N/A   Occupational History  . retired  Social History Main Topics  . Smoking status: Never Smoker  . Smokeless tobacco: Never Used  . Alcohol use No  . Drug use: No  . Sexual activity: Not on file   Other Topics Concern  . Not on file   Social History Narrative  . No narrative on file    FAMILY HISTORY: Family History  Problem Relation Age of Onset  . Breast cancer Mother 37       d.52 from metastatic disease  . Throat cancer Father        d.65 history of smoking  . Rheum arthritis Sister        3 sisters pos. for osteo and RA  . Diabetes Maternal Aunt   . Cancer Maternal Grandfather        mouth cancer-chewed tobacco  . Breast cancer Cousin 28  . Breast cancer Daughter 70  . Stomach cancer Sister 21    REVIEW OF SYSTEMS: Reviewed with the patient as per History of present illness. Psych: Patient denies having dental phobia.  DENTAL HISTORY: CHIEF COMPLAINT: Patient referred by Dr. [name] for a dental consultation.  HPI: Jasmine Hood is a 72 year old female recently diagnosed with breast cancer with bone metastases. Patient referred by Dr. [name] for a pre-Xgeva therapy dental protocol examination. Patient subsequently found to have received 2 doses of Xgeva  therapy on 11/19/2016 and 12/17/2016. Patient now seen for comprehensive dental examination prior to further doses of the Xgeva therapy and risk for osteonecrosis of the jaw related to the [facility] therapy.  The patient is currently complaining of toothache symptoms coming from the lower left molar #19 as well as lower right molar #30. Patient describes intermittent, sharp pain coming from these teeth. The pain last for seconds to minutes at a time. Patient denies any spontaneous pain at night. Patient indicates that the teeth hurt at an intensity of 10 out of 10 when they hurt, but currently is 0 out of 10 today. This pain has been occurring over the past several months but more acutely over the last 2 weeks. The patient last saw a dentist approximately 5 years ago for an exam and cleaning. This was with her cousin who is a Pharmacist, community in [place]. Patient has not seen a dentist for regular dental care since she lost her insurance upon retirement with [employer]. The patient denies having dental phobia.  DENTAL EXAMINATION: GENERAL: The patient is a well-developed, obese female in no acute distress. HEAD AND NECK: There is no palpable neck lymphadenopathy. The patient denies acute TMJ symptoms. INTRAORAL EXAM: The patient has incipient xerostomia. Patient has bilateral multilobular mandibular lingual tori. Patient also has lateral exostoses in the area of tooth numbers 1 through 5, 6, 11, 12 through 16, and #30. There is significant incisal and occlusal attrition. DENTITION: Patient is missing tooth numbers 1, 16, 17, 29, and 32. There appears to be a retained root in the area of #29. PERIODONTAL: The patient has chronic periodontitis with plaque and calculus accumulations, selective areas of gingival recession, but no significant tooth mobility there is incipient to moderate bone loss noted. Periodontal charting was deferred secondary to the gross amount of calculus. DENTAL CARIES/SUBOPTIMAL  RESTORATIONS: There are extensive dental caries associated with the mesial of #19 and the distal of #30. The patient may have incipient dental caries associated with the distal of tooth numbers 12 and 13. ENDODONTIC: The patient with a history of acute pulpitis symptoms coming from tooth numbers 19 and 30.  I do not see any evidence of pe periapical pathology at this time. I do see extensive dental caries with extension into the pulpal areas of tooth numbers 19 and 30, however. Patient has previous root canal therapy associated with tooth #30. CROWN AND BRIDGE: The patient has crowns on tooth numbers 19 and 30 that are affected by recurrent caries. PROSTHODONTIC: There are no partial dentures. OCCLUSION: Patient has a poor occlusal scheme secondary to the significant incisal and occlusal attrition and flat plane occlusion.  RADIOGRAPHIC INTERPRETATION: An orthopantogram was taken and supplemented with a full series of dental radiographs. The orthopantogram a suboptimal secondary to patient movement. There are missing tooth numbers 1, 16, 17, 29, and 32. There appears to be a retained root tip in the area of tooth #29 that is near the mental nerve foramen. There is incipient to moderate bone loss noted. Radiographic calculus is noted. There are radiopacities noted consistent with a bilateral mandibular lingual tori. There are extensive dental caries associated with tooth numbers 19 and 30 with probable pulpal involvement. There may be incipient dental caries associated with distal of tooth numbers 12 and 13. There is a previous root canal therapy associated with tooth #30. Her crown restorations and tooth numbers 19 and 30. There is evidence of incisal and occlusal attrition.   ASSESSMENTS: 1. Breast cancer with current active chemotherapy 2. Bone metastasis 3. History of 2 doses of Xgeva therapy with risk for osteonecrosis of the jaw with invasive dental procedures 4. History of acute pulpitis of  tooth numbers 19 and 30 5. Dental caries 6. Incisal and occlusal attrition 7. Flexure lesions 8. Chronic periodontitis with bone loss 9. Gingival recession 10. Accretions 11. Multiple missing teeth neck  12. Questionable retained root tip in the area of #29 13. Poor occlusal scheme 14. Bilateral mandibular lingual tori 15. Multiple lateral exostoses as per dental charting form   PLAN/RECOMMENDATIONS: 1. I discussed the risks, benefits, and complications of various treatment options with the patient in relationship to her medical and dental conditions, current chemotherapy, and previous doses of Xgeva therapy and future Xgeva therapy with the risk for osteonecrosis of the jaw related to invasive dental procedures. We discussed various treatment options to include no treatment, multiple extractions with alveoloplasty, pre-prosthetic surgery as indicated, periodontal therapy, dental restorations, root canal therapy, crown and bridge therapy, implant therapy, and replacement of missing teeth as indicated. We also discussed referral to an oral surgeon for the surgical procedures and follow-up with a general dentist of her choice for initial periodontal therapy and dental restorations and periodontal maintenance procedures. The patient currently wishes to proceed with referral to an oral surgeon for evaluation for extraction of tooth numbers 19 and 30 and possibly retained root tip area #29. The patient is aware of the potential risk for osteonecrosis of the jaw related to previous Xgeva therapy and is willing to discuss these potential complications with the oral surgeon prior to dental extraction procedures. The patient is also aware of the need to coordinate these dental extraction procedures with Dr. [name] and his current chemotherapy regimen. The patient will then provide the name of the general dentist she wishes to be referred to for periodontal therapy and evaluation for restoration of tooth  numbers 12 and 13 as indicated.   2. Discussion of findings with medical team and coordination of future medical and dental care as needed.  I spent in excess of  120 minutes during the conduct of this consultation and >50% of  this time involved direct face-to-face encounter for counseling and/or coordination of the patient's care.    Lenn Cal, DDS

## 2017-01-09 NOTE — Patient Instructions (Signed)
The patient is to follow up with Dr. Diona Browner for dental consultation appointment for evaluation for extraction of tooth numbers 19, 30, and possibly retained root tip in the area #29. Patient is aware of the potential risk for osteonecrosis of the jaw related to previous Xgeva therapy and the anticipated surgical procedures. The patient is also aware that she will need to coordinate surgical procedures with Dr. [name] and his current chemotherapy regimen. Patient is to provide the name of the general dentist of her choice for follow-up periodontal therapy and dental restorations in the future.  Lenn Cal, DDS

## 2017-01-14 DIAGNOSIS — M1711 Unilateral primary osteoarthritis, right knee: Secondary | ICD-10-CM | POA: Diagnosis not present

## 2017-01-14 DIAGNOSIS — M19012 Primary osteoarthritis, left shoulder: Secondary | ICD-10-CM | POA: Diagnosis not present

## 2017-01-14 DIAGNOSIS — M7532 Calcific tendinitis of left shoulder: Secondary | ICD-10-CM | POA: Diagnosis not present

## 2017-01-14 DIAGNOSIS — M5136 Other intervertebral disc degeneration, lumbar region: Secondary | ICD-10-CM | POA: Diagnosis not present

## 2017-01-14 DIAGNOSIS — M545 Low back pain: Secondary | ICD-10-CM | POA: Diagnosis not present

## 2017-01-14 DIAGNOSIS — M47896 Other spondylosis, lumbar region: Secondary | ICD-10-CM | POA: Diagnosis not present

## 2017-01-16 DIAGNOSIS — C50911 Malignant neoplasm of unspecified site of right female breast: Secondary | ICD-10-CM | POA: Diagnosis not present

## 2017-01-17 DIAGNOSIS — M19041 Primary osteoarthritis, right hand: Secondary | ICD-10-CM | POA: Diagnosis not present

## 2017-01-17 DIAGNOSIS — M19031 Primary osteoarthritis, right wrist: Secondary | ICD-10-CM | POA: Diagnosis not present

## 2017-01-21 DIAGNOSIS — R69 Illness, unspecified: Secondary | ICD-10-CM | POA: Diagnosis not present

## 2017-01-28 ENCOUNTER — Ambulatory Visit (HOSPITAL_BASED_OUTPATIENT_CLINIC_OR_DEPARTMENT_OTHER): Payer: Medicare HMO | Admitting: Oncology

## 2017-01-28 ENCOUNTER — Other Ambulatory Visit (HOSPITAL_BASED_OUTPATIENT_CLINIC_OR_DEPARTMENT_OTHER): Payer: Medicare HMO

## 2017-01-28 ENCOUNTER — Other Ambulatory Visit: Payer: Medicare HMO

## 2017-01-28 ENCOUNTER — Telehealth: Payer: Self-pay | Admitting: Oncology

## 2017-01-28 ENCOUNTER — Ambulatory Visit (HOSPITAL_BASED_OUTPATIENT_CLINIC_OR_DEPARTMENT_OTHER): Payer: Medicare HMO

## 2017-01-28 ENCOUNTER — Ambulatory Visit: Payer: Medicare HMO

## 2017-01-28 ENCOUNTER — Other Ambulatory Visit: Payer: Self-pay | Admitting: *Deleted

## 2017-01-28 VITALS — Resp 19

## 2017-01-28 VITALS — Wt 229.4 lb

## 2017-01-28 DIAGNOSIS — C773 Secondary and unspecified malignant neoplasm of axilla and upper limb lymph nodes: Secondary | ICD-10-CM

## 2017-01-28 DIAGNOSIS — Z5111 Encounter for antineoplastic chemotherapy: Secondary | ICD-10-CM | POA: Diagnosis not present

## 2017-01-28 DIAGNOSIS — C7951 Secondary malignant neoplasm of bone: Secondary | ICD-10-CM | POA: Diagnosis not present

## 2017-01-28 DIAGNOSIS — Z08 Encounter for follow-up examination after completed treatment for malignant neoplasm: Secondary | ICD-10-CM

## 2017-01-28 DIAGNOSIS — Z170 Estrogen receptor positive status [ER+]: Secondary | ICD-10-CM

## 2017-01-28 DIAGNOSIS — C50811 Malignant neoplasm of overlapping sites of right female breast: Secondary | ICD-10-CM

## 2017-01-28 DIAGNOSIS — Z6841 Body Mass Index (BMI) 40.0 and over, adult: Secondary | ICD-10-CM

## 2017-01-28 DIAGNOSIS — K7469 Other cirrhosis of liver: Secondary | ICD-10-CM

## 2017-01-28 DIAGNOSIS — Z85038 Personal history of other malignant neoplasm of large intestine: Secondary | ICD-10-CM

## 2017-01-28 LAB — CBC WITH DIFFERENTIAL/PLATELET
BASO%: 0.1 % (ref 0.0–2.0)
BASOS ABS: 0 10*3/uL (ref 0.0–0.1)
EOS ABS: 0 10*3/uL (ref 0.0–0.5)
EOS%: 0.3 % (ref 0.0–7.0)
HEMATOCRIT: 28.4 % — AB (ref 34.8–46.6)
HGB: 9.3 g/dL — ABNORMAL LOW (ref 11.6–15.9)
LYMPH#: 0.6 10*3/uL — AB (ref 0.9–3.3)
LYMPH%: 5.4 % — ABNORMAL LOW (ref 14.0–49.7)
MCH: 33.9 pg (ref 25.1–34.0)
MCHC: 32.7 g/dL (ref 31.5–36.0)
MCV: 103.6 fL — AB (ref 79.5–101.0)
MONO#: 1 10*3/uL — AB (ref 0.1–0.9)
MONO%: 10 % (ref 0.0–14.0)
NEUT#: 8.6 10*3/uL — ABNORMAL HIGH (ref 1.5–6.5)
NEUT%: 84.2 % — AB (ref 38.4–76.8)
PLATELETS: 199 10*3/uL (ref 145–400)
RBC: 2.74 10*6/uL — ABNORMAL LOW (ref 3.70–5.45)
RDW: 14.9 % — ABNORMAL HIGH (ref 11.2–14.5)
WBC: 10.2 10*3/uL (ref 3.9–10.3)

## 2017-01-28 LAB — COMPREHENSIVE METABOLIC PANEL
ALT: 29 U/L (ref 0–55)
AST: 44 U/L — AB (ref 5–34)
Albumin: 2.1 g/dL — ABNORMAL LOW (ref 3.5–5.0)
Alkaline Phosphatase: 102 U/L (ref 40–150)
Anion Gap: 10 mEq/L (ref 3–11)
BUN: 10.7 mg/dL (ref 7.0–26.0)
CHLORIDE: 101 meq/L (ref 98–109)
CO2: 25 meq/L (ref 22–29)
Calcium: 8.5 mg/dL (ref 8.4–10.4)
Creatinine: 0.7 mg/dL (ref 0.6–1.1)
EGFR: 82 mL/min/{1.73_m2} — AB (ref >90)
Glucose: 138 mg/dl (ref 70–140)
POTASSIUM: 3.4 meq/L — AB (ref 3.5–5.1)
SODIUM: 136 meq/L (ref 136–145)
Total Bilirubin: 0.93 mg/dL (ref 0.20–1.20)
Total Protein: 6.5 g/dL (ref 6.4–8.3)

## 2017-01-28 MED ORDER — SODIUM CHLORIDE 0.9% FLUSH
10.0000 mL | INTRAVENOUS | Status: DC | PRN
  Administered 2017-01-28: 10 mL
  Filled 2017-01-28: qty 10

## 2017-01-28 MED ORDER — PALONOSETRON HCL INJECTION 0.25 MG/5ML
0.2500 mg | Freq: Once | INTRAVENOUS | Status: AC
  Administered 2017-01-28: 0.25 mg via INTRAVENOUS

## 2017-01-28 MED ORDER — HEPARIN SOD (PORK) LOCK FLUSH 100 UNIT/ML IV SOLN
500.0000 [IU] | Freq: Once | INTRAVENOUS | Status: AC | PRN
  Administered 2017-01-28: 500 [IU]
  Filled 2017-01-28: qty 5

## 2017-01-28 MED ORDER — SODIUM CHLORIDE 0.9 % IV SOLN
Freq: Once | INTRAVENOUS | Status: AC
  Administered 2017-01-28: 11:00:00 via INTRAVENOUS

## 2017-01-28 MED ORDER — FLUOROURACIL CHEMO INJECTION 2.5 GM/50ML
600.0000 mg/m2 | Freq: Once | INTRAVENOUS | Status: AC
  Administered 2017-01-28: 1300 mg via INTRAVENOUS
  Filled 2017-01-28: qty 26

## 2017-01-28 MED ORDER — METHOTREXATE SODIUM (PF) CHEMO INJECTION 250 MG/10ML
39.0000 mg/m2 | Freq: Once | INTRAMUSCULAR | Status: AC
  Administered 2017-01-28: 85 mg via INTRAVENOUS
  Filled 2017-01-28: qty 3.4

## 2017-01-28 MED ORDER — SODIUM CHLORIDE 0.9 % IV SOLN
600.0000 mg/m2 | Freq: Once | INTRAVENOUS | Status: AC
  Administered 2017-01-28: 1300 mg via INTRAVENOUS
  Filled 2017-01-28: qty 65

## 2017-01-28 MED ORDER — DEXAMETHASONE SODIUM PHOSPHATE 10 MG/ML IJ SOLN
10.0000 mg | Freq: Once | INTRAMUSCULAR | Status: AC
  Administered 2017-01-28: 10 mg via INTRAVENOUS

## 2017-01-28 MED ORDER — PALONOSETRON HCL INJECTION 0.25 MG/5ML
INTRAVENOUS | Status: AC
  Filled 2017-01-28: qty 5

## 2017-01-28 MED ORDER — LIDOCAINE-PRILOCAINE 2.5-2.5 % EX CREA: 1.0000 "application " | TOPICAL_CREAM | CUTANEOUS | 0 refills | Status: DC | PRN

## 2017-01-28 MED ORDER — DEXAMETHASONE SODIUM PHOSPHATE 10 MG/ML IJ SOLN
INTRAMUSCULAR | Status: AC
  Filled 2017-01-28: qty 1

## 2017-01-28 NOTE — Telephone Encounter (Signed)
Gave patient avs and calendar with appts.  °

## 2017-01-28 NOTE — Progress Notes (Signed)
blood return noted before, during and after Methotrexate and Adrucil push.

## 2017-01-28 NOTE — Progress Notes (Signed)
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 Today is day 1 cycle 5 of 6 planned cycles of CMF, which she receives every 21 days. She voices concern on if she is taking her diflucan as prescribed and that she will need a refill. Pt reports that she has been doing well since her last treatment. Pt notes that following treatments, she rests and takes it easy. Pt uses Queasy Pops for her nausea. Denies fatigue or fever. She would like to stay on schedule with her chemotherapy and that her daughter helps the patient as needed.  REVIEW OF SYSTEMS: She previously had a dental appointment with 2 dental extractions and root canal. Pt with a healing bruise to her left mandible and left-sided neck that she is self-treating with ice. Denies appetite change, decreased PO intake, or trouble swallowing. Denies bowel issues. Pt reports mild dysuria. Denies hematuria. Denies cough. She recently obtained her flu shot on 01/22/2017. A detailed review of systems today was otherwise negative   BREAST CANCER HISTORY: From the original intake note  Jasmine Hood herself noted a change in her right breast sometime in September 2017. She did not immediately bring it to medical attention. When she noted some significant  changes in her right nipple she saw [name] and was set up for bilateral diagnostic mammography with tomography and [facility]. This found the breast density to be category B. In the upper and lower outer quadrants of the right breast there was a mass measuring at least 10 cm. There were also groups of heterogeneous calcifications measuring 3 cm and a separate group 0.4 cm. On physical exam there was a palpable firm mass measuring approximately 10 cm involving the upper outer and lower outer quadrant of the right breast, with skin reaction and nipple retraction. There was no palpable right axillary adenopathy.  Right breast ultrasonography confirmed a large hypoechoic mass with irregular margins extending to the skin surface. The right axilla showed 2 lymph nodes which appeared abnormal.  In the left breast there were some indeterminate retroareolar calcifications which were felt to warrant biopsy. This was performed 10/08/2016 and showed a fibroadenoma ([id number]).  Biopsy of the right breast mass at the 7:00 and 10:00 positions as well as one of the suspicious lymph nodes all showed invasive ductal carcinoma, E-cadherin positive. Separate prognostic profiles from the 2 breast masses were sent. Estrogen receptor was positive at 95-70%, progesterone receptor was positive at 15-85%, all with strong staining intensity, the MIB-1 was 15-20%, and HER-2 was nonamplified, the signals ratio being 1.28-1.72 and the number per cell 1.85-3.95.  The patient's subsequent history is as detailed below   PAST MEDICAL HISTORY: Past Medical History:  Diagnosis Date  . Breast cancer ([place]) 10/07/2016   bilateral breast   . [place] Shark recluse spider bite    right leg  . COLONIC  POLYPS, HX OF 01/05/2009   Qualifier: Diagnosis of  By: [name]  MD, [name]   . Complication of anesthesia    slow to awaken after wisdom  teeth extraction age 58  . Genetic testing  11/27/2016   Ms. Wickstrom underwent genetic counseling and testing for hereditary cancer syndromes on 11/19/2016. Her results were negative for mutations in all 46 genes analyzed by [facility]'s 46-gene Common Hereditary Cancers Panel. Genes analyzed include: APC, ATM, AXIN2, BARD1, BMPR1A, BRCA1, BRCA2, BRIP1, CDH1, CDKN2A, CHEK2, CTNNA1, DICER1, EPCAM, GREM1, HOXB13, KIT, MEN1, MLH1, MSH2, MSH3, MSH6, MUTYH, NBN,  . HYPOTHYROIDISM 02/01/2010   Qualifier: Diagnosis of  By: [name]  MD, [name]   . Morbid obesity ([place]) 01/05/2009   Qualifier: Diagnosis of  By: [name]  MD, [name]   . Pneumonia 1987  . Sleep apnea    no cpap used     PAST SURGICAL HISTORY: Past Surgical History:  Procedure Laterality Date  . BREAST BIOPSY Right 10/07/2016   invasive mammary carcinoma  . BREAST BIOPSY Left 10/08/2016   left  breast fibroadenoma no malignancy  . CATARACT EXTRACTION Bilateral   . COLONOSCOPY WITH PROPOFOL N/A 12/22/2015   Procedure: COLONOSCOPY WITH PROPOFOL;  Surgeon: [name], MD;  Location: [facility];  Service: Endoscopy;  Laterality: N/A;  . colonscopy  2012   with polyp removed  . MOLE REMOVAL  10/29/2016   Procedure: MOLE REMOVAL LEFT CHEST;  Surgeon: Alphonsa Overall, MD;  Location: [facility];  Service: General;;  . PORTACATH PLACEMENT N/A 10/29/2016   Procedure: INSERTION PORT-A-CATH;  Surgeon: Alphonsa Overall, MD;  Location: [facility];  Service: General;  Laterality: N/A;  . surgery for spider bite  1998  . TUBAL LIGATION    . WISDOM TOOTH EXTRACTION  age 17    FAMILY HISTORY Family History  Problem Relation Age of Onset  . Breast cancer Mother 31       d.52 from metastatic disease  . Throat cancer Father        d.65 history of smoking  . Rheum arthritis Sister        3 sisters pos. for osteo and RA  . Diabetes Maternal Aunt   . Cancer Maternal Grandfather        mouth cancer-chewed tobacco  . Breast cancer Cousin 68  . Breast cancer Daughter 65  . Stomach cancer  Sister 16  The patient's father died at the age of 55 from laryngeal cancer in the setting of tobacco abuse. The patient's mother died at the age of 26 from metastatic breast cancer which had been diagnosed in her early 29s. The patient has 2 half-sisters and one half-brother. One of her sisters has "stomach" cancer. There is also a cousin on the maternal side with breast cancer diagnosed at age 42. No family member has been genetically tested yet  GYNECOLOGIC HISTORY:  No LMP recorded. Patient is postmenopausal. Menarche age 66, first live birth age 28, she is [place] P5. She is status post bilateral tubal ligation. She stopped having periods in 1985. She tells me she did not use hormone replacement. However a note from her bone density scan December 2001 states "this patient began hormone replacement therapy 3 months ago"  SOCIAL HISTORY:  She is originally from [place]. 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 She has survived 2 husband's period at home currently is just she and her daughter Ashaunte Standley. She tells me this daughter had mild brain  damage at birth and is not working, and cannot drive because of a history of seizures. However she is the one she is planning to name is her healthcare power of attorney. The patient has 13 grandchildren and 4 great-grandchildren. She attends a [employer]: At the 10/21/2016 visit the patient was given the appropriate documents to complete and notarize at her discretion   HEALTH MAINTENANCE: Social History  Substance Use Topics  . Smoking status: Never Smoker  . Smokeless tobacco: Never Used  . Alcohol use No     Colonoscopy: 12/22/2015/Nandigam  PAP:  Bone density: December 2001 was normal   Allergies  Allergen Reactions  . Bee Venom Anaphylaxis    Current Outpatient Prescriptions  Medication Sig Dispense Refill  . acetaminophen (TYLENOL) 500 MG tablet Take 500 mg by mouth daily as needed  for moderate pain or headache.    . dexamethasone (DECADRON) 4 MG tablet Take 2 tablets (8 mg total) by mouth daily. Start the day after chemotherapy for 2 days. Take with food. 30 tablet 1  . EPINEPHrine (EPIPEN 2-PAK) 0.3 mg/0.3 mL SOAJ injection Inject 0.3 mLs (0.3 mg total) into the muscle once. 1 Device 2  . fluconazole (DIFLUCAN) 100 MG tablet Take 2 tablets (200 mg total) by mouth daily. Take for 10 days following treatment, every 21 days 70 tablet 1  . lidocaine-prilocaine (EMLA) cream Apply 1 application topically as needed. 30 g 0  . magic mouthwash w/lidocaine SOLN Take 5 mLs by mouth 4 (four) times daily as needed for mouth pain. 240 mL 0  . promethazine (PHENERGAN) 25 MG suppository Place 1 suppository (25 mg total) rectally every 6 (six) hours as needed for nausea or vomiting. 12 each 4  . triamcinolone ointment (KENALOG) 0.5 % Apply 1 application topically 2 (two) times daily. 30 g 0  . valACYclovir (VALTREX) 500 MG tablet Take 500 mg by mouth 2 (two) times daily.     No current facility-administered medications for this visit.     OBJECTIVE: Older white woman Examined in a wheelchair  There were no vitals filed for this visit.   There is no height or weight on file to calculate BMI.    ECOG FS:1 - Symptomatic but completely ambulatory  Please see the treatment area flow sheet for today's vitals  Sclerae unicteric, pupils round and equal Oropharynx shows recent surgery to the right jaw area, but no active bleeding. There is a bruise in the submental area on the right No cervical or supraclavicular adenopathy Lungs no rales or rhonchi Heart regular rate and rhythm Abd soft, nontender, positive bowel sounds MSK no focal spinal tenderness, no upper extremity lymphedema Neuro: nonfocal, well oriented, appropriate affect Breasts: Deferred   Right breast 12/17/2016   Right breast 11/12/16     LAB RESULTS:  CMP     Component Value Date/Time   NA 136 01/28/2017 0750    K 3.4 (L) 01/28/2017 0750   CL 107 04/28/2015 1133   CO2 25 01/28/2017 0750   GLUCOSE 138 01/28/2017 0750   BUN 10.7 01/28/2017 0750   CREATININE 0.7 01/28/2017 0750   CALCIUM 8.5 01/28/2017 0750   PROT 6.5 01/28/2017 0750   ALBUMIN 2.1 (L) 01/28/2017 0750   AST 44 (H) 01/28/2017 0750   ALT 29 01/28/2017 0750   ALKPHOS 102 01/28/2017 0750   BILITOT 0.93 01/28/2017 0750   GFRNONAA 102.34 01/25/2010 0858   GFRAA  11/24/2008 1315    >60  The eGFR has been calculated using the MDRD equation. This calculation has not been validated in all clinical situations. eGFR's persistently <60 mL/min signify possible Chronic Kidney Disease.    No results found for: TOTALPROTELP, ALBUMINELP, A1GS, A2GS, BETS, BETA2SER, GAMS, MSPIKE, SPEI  No results found for: Nils Pyle, [facility]  Lab Results  Component Value Date   WBC 10.2 01/28/2017   NEUTROABS 8.6 (H) 01/28/2017   HGB 9.3 (L) 01/28/2017   HCT 28.4 (L) 01/28/2017   MCV 103.6 (H) 01/28/2017   PLT 199 01/28/2017      Chemistry      Component Value Date/Time   NA 136 01/28/2017 0750   K 3.4 (L) 01/28/2017 0750   CL 107 04/28/2015 1133   CO2 25 01/28/2017 0750   BUN 10.7 01/28/2017 0750   CREATININE 0.7 01/28/2017 0750      Component Value Date/Time   CALCIUM 8.5 01/28/2017 0750   ALKPHOS 102 01/28/2017 0750   AST 44 (H) 01/28/2017 0750   ALT 29 01/28/2017 0750   BILITOT 0.93 01/28/2017 0750       No results found for: LABCA2  No components found for: [id number]  No results for input(s): INR in the last 168 hours.  Urinalysis    Component Value Date/Time   COLORURINE LT. YELLOW 01/25/2010 0858   APPEARANCEUR CLEAR 01/25/2010 0858   LABSPEC >=1.030 01/25/2010 0858   PHURINE 5.5 01/25/2010 0858   GLUCOSEU NEGATIVE 01/25/2010 0858   HGBUR 1+ 12/30/2008 0838   BILIRUBINUR Neg 04/28/2015 1122   KETONESUR 15 (?) 01/25/2010 0858   PROTEINUR Neg 04/28/2015 1122   PROTEINUR NEGATIVE 11/24/2008  1545   UROBILINOGEN 0.2 04/28/2015 1122   UROBILINOGEN 0.2 01/25/2010 0858   NITRITE Neg 04/28/2015 1122   NITRITE NEGATIVE 01/25/2010 0858   LEUKOCYTESUR small (1+) (A) 04/28/2015 1122     STUDIES: No results found.  ELIGIBLE FOR AVAILABLE RESEARCH PROTOCOL: no  ASSESSMENT: 71 y.o. 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 Results are negative for pathogenic mutations in 46 genes analyzed by [facility]'s Common Hereditary Cancers Panel. Results are dated 11/26/2016. Genes tested: APC, ATM, AXIN2, BARD1, BMPR1A, BRCA1, BRCA2, BRIP1, CDH1, CDKN2A, CHEK2, CTNNA1, DICER1, EPCAM, GREM1, HOXB13, KIT, MEN1, MLH1, MSH2, MSH3, MSH6, MUTYH, NBN, NF1, NTHL1, PALB2, PDGFRA, PMS2, POLD1, POLE, PTEN, RAD50, RAD51C, RAD51D, SDHA, SDHB, SDHC, SDHD, SMAD4, SMARCA4, STK11, TP53, TSC1, TSC2, and VHL.  (a) A variant of uncertain significance (not clinically actionable) was noted in CTNNA1.    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 There was a 4 cm right breast mass with right axillary lymph nodes and evidence of cirrhosis.  (3) neoadjuvant treatment to consist of cyclophosphamide, methotrexate, and fluorouracil (CMF) chemotherapy every 21 days 8, starting 11/05/2016  (4) definitive surgery to follow  (5) adjuvant radiation to follow surgery  (6) anti-estrogen therapy to follow at the completion of local treatment  PLAN: Paislea will  proceed to her fifth cycle of CMF chemotherapy today. She will return in 3 weeks for cycle 6. I will be  out of town so she will see my partner Dr. Sonny Dandy during that visit.  Once she completes her treatments, she will be ready to proceed to surgery. Normally we remove the port, but axis is such a big problem for her I would leave the port in until it is clear that she will not need IV fluids her intravenous medicines of any sort in the future.  Her Diflucan was prescribed at 100 mg but she has 200 mg tablets. She only has 10 tablets. I told her to take a tablet daily for 5 days and then she has 5 tablets for her last cycle, to start 3 weeks from now. She also requested a prescription for Uni-Dur which I was glad to provide for her  Once she completes her surgery she will follow-up with Dr. Lisbeth Renshaw regarding consolidative radiation  She will be started on anastrozole at the completion of chemotherapy.  She knows to call for any problems that may develop before the next visit    Magrinat, Virgie Dad, MD  01/28/17 10:36 AM Medical Oncology and Hematology [facility] [place], [place] [address] Tel. [phone]    Fax. [phone]  This document serves as a record of services personally performed by Lurline Del, MD. It was created on her behalf by Steva Colder, a trained medical scribe. The creation of this record is based on the scribe's personal observations and the provider's statements to them. This document has been checked and approved by the attending provider.

## 2017-01-28 NOTE — Patient Instructions (Signed)
[place] Discharge Instructions for Patients Receiving Chemotherapy  Today you received the following chemotherapy agents: Cytoxan, Methotrexate and Adrucil   To help prevent nausea and vomiting after your treatment, we encourage you to take your nausea medication as directed.    If you develop nausea and vomiting that is not controlled by your nausea medication, call the clinic.   BELOW ARE SYMPTOMS THAT SHOULD BE REPORTED IMMEDIATELY:  *FEVER GREATER THAN 100.5 F  *CHILLS WITH OR WITHOUT FEVER  NAUSEA AND VOMITING THAT IS NOT CONTROLLED WITH YOUR NAUSEA MEDICATION  *UNUSUAL SHORTNESS OF BREATH  *UNUSUAL BRUISING OR BLEEDING  TENDERNESS IN MOUTH AND THROAT WITH OR WITHOUT PRESENCE OF ULCERS  *URINARY PROBLEMS  *BOWEL PROBLEMS  UNUSUAL RASH Items with * indicate a potential emergency and should be followed up as soon as possible.  Feel free to call the clinic you have any questions or concerns. The clinic phone number is [phone].  Please show the [facility] at check-in to the Emergency Department and triage nurse.

## 2017-02-10 ENCOUNTER — Telehealth: Payer: Self-pay
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
## 2017-02-10 NOTE — Telephone Encounter (Signed)
Left a voice message concerning her appointmnet on Wed @ 8:30am. Per 9/24 message

## 2017-02-11 ENCOUNTER — Telehealth: Payer: Self-pay | Admitting: *Deleted

## 2017-02-11 NOTE — Telephone Encounter (Signed)
This RN returned call to [facility] per VM left at 310 pm - Fraser Din states she was called to the patient's home by her daughter [name] due to " Fraser Din was just yelling - trying to get in her car to go to Dr Pollie Friar "  Fraser Din states she was able to calm the patient down - and has now left the home with instructions to the patient's daughter to call 911 if patient becomes agitated to where she could harm herself or [name].  Note Fraser Din states pt has has changes ongoing over the past week - including not eating, confused (thinks she has appointment somewhere but then doesn't ) when she is corrected she becomes very agitated and is difficult to calm down.  Pat states " [name] said the pt has been coughing and choking on blood ( ? From recent dental extractions).  This RN inquired if pt has had any prior episodes of this type of behavior with Fraser Din stating " no - she always been able to be in control and manage things well "  Fraser Din states they have arranged to come with pt in the am to scheduled appointment.  This RN did attempt to reach patient at number on demographic page with no answer and VM stating mailbox is full.  This note will be sent to [facility]/NP and SW for communication and concerns with home enviroment.

## 2017-02-12 ENCOUNTER — Observation Stay (HOSPITAL_COMMUNITY)
Admission: EM | Admit: 2017-02-12 | Discharge: 2017-02-14 | Disposition: A | Discharge status: Home / Self Care | Payer: Medicare HMO | Attending: Internal Medicine | Admitting: Internal Medicine

## 2017-02-12 ENCOUNTER — Encounter (HOSPITAL_COMMUNITY): Payer: Self-pay

## 2017-02-12 ENCOUNTER — Emergency Department (HOSPITAL_COMMUNITY): Payer: Medicare HMO

## 2017-02-12 ENCOUNTER — Ambulatory Visit: Payer: Self-pay | Admitting: Adult Health

## 2017-02-12 ENCOUNTER — Other Ambulatory Visit: Payer: Self-pay

## 2017-02-12 DIAGNOSIS — Z170 Estrogen receptor positive status [ER+]: Secondary | ICD-10-CM | POA: Diagnosis not present

## 2017-02-12 DIAGNOSIS — R946 Abnormal results of thyroid function studies: Secondary | ICD-10-CM | POA: Insufficient documentation

## 2017-02-12 DIAGNOSIS — Z85038 Personal history of other malignant neoplasm of large intestine: Secondary | ICD-10-CM | POA: Diagnosis not present

## 2017-02-12 DIAGNOSIS — R531 Weakness: Secondary | ICD-10-CM | POA: Diagnosis not present

## 2017-02-12 DIAGNOSIS — C50811 Malignant neoplasm of overlapping sites of right female breast: Secondary | ICD-10-CM | POA: Insufficient documentation

## 2017-02-12 DIAGNOSIS — I10 Essential (primary) hypertension: Secondary | ICD-10-CM | POA: Insufficient documentation

## 2017-02-12 DIAGNOSIS — B961 Klebsiella pneumoniae [K. pneumoniae] as the cause of diseases classified elsewhere: Secondary | ICD-10-CM | POA: Diagnosis not present

## 2017-02-12 DIAGNOSIS — R4182 Altered mental status, unspecified: Secondary | ICD-10-CM | POA: Diagnosis not present

## 2017-02-12 DIAGNOSIS — N390 Urinary tract infection, site not specified: Secondary | ICD-10-CM | POA: Diagnosis not present

## 2017-02-12 DIAGNOSIS — E860 Dehydration: Secondary | ICD-10-CM | POA: Insufficient documentation

## 2017-02-12 DIAGNOSIS — A4189 Other specified sepsis: Secondary | ICD-10-CM | POA: Insufficient documentation

## 2017-02-12 DIAGNOSIS — N179 Acute kidney failure, unspecified: Principal | ICD-10-CM | POA: Diagnosis present

## 2017-02-12 DIAGNOSIS — D539 Nutritional anemia, unspecified: Secondary | ICD-10-CM | POA: Insufficient documentation

## 2017-02-12 DIAGNOSIS — R404 Transient alteration of awareness: Secondary | ICD-10-CM | POA: Diagnosis not present

## 2017-02-12 DIAGNOSIS — Z79899 Other long term (current) drug therapy: Secondary | ICD-10-CM | POA: Insufficient documentation

## 2017-02-12 DIAGNOSIS — E876 Hypokalemia: Secondary | ICD-10-CM | POA: Diagnosis not present

## 2017-02-12 DIAGNOSIS — R7989 Other specified abnormal findings of blood chemistry: Secondary | ICD-10-CM | POA: Diagnosis present

## 2017-02-12 DIAGNOSIS — C50919 Malignant neoplasm of unspecified site of unspecified female breast: Secondary | ICD-10-CM

## 2017-02-12 DIAGNOSIS — M6281 Muscle weakness (generalized): Secondary | ICD-10-CM | POA: Diagnosis present

## 2017-02-12 LAB — CBC WITH DIFFERENTIAL/PLATELET
BASOS ABS: 0.1 10*3/uL (ref 0.0–0.1)
Basophils Relative: 1 %
EOS PCT: 0 %
Eosinophils Absolute: 0 10*3/uL (ref 0.0–0.7)
HEMATOCRIT: 26.6 % — AB (ref 36.0–46.0)
HEMOGLOBIN: 9.1 g/dL — AB (ref 12.0–15.0)
LYMPHS ABS: 0.8 10*3/uL (ref 0.7–4.0)
LYMPHS PCT: 9 %
MCH: 33.5 pg (ref 26.0–34.0)
MCHC: 34.2 g/dL (ref 30.0–36.0)
MCV: 97.8 fL (ref 78.0–100.0)
MONOS PCT: 13 %
Monocytes Absolute: 1.2 10*3/uL — ABNORMAL HIGH (ref 0.1–1.0)
NEUTROS ABS: 6.9 10*3/uL (ref 1.7–7.7)
Neutrophils Relative %: 77 %
Platelets: 130 10*3/uL — ABNORMAL LOW (ref 150–400)
RBC: 2.72 MIL/uL — AB (ref 3.87–5.11)
RDW: 14.5 % (ref 11.5–15.5)
WBC: 9 10*3/uL (ref 4.0–10.5)

## 2017-02-12 LAB — COMPREHENSIVE METABOLIC PANEL
ALT: 47 U/L (ref 14–54)
AST: 87 U/L — AB (ref 15–41)
Albumin: 2.3 g/dL — ABNORMAL LOW (ref 3.5–5.0)
Alkaline Phosphatase: 105 U/L (ref 38–126)
Anion gap: 16 — ABNORMAL HIGH (ref 5–15)
BILIRUBIN TOTAL: 1.1 mg/dL (ref 0.3–1.2)
BUN: 39 mg/dL — ABNORMAL HIGH (ref 6–20)
CHLORIDE: 100 mmol/L — AB (ref 101–111)
CO2: 20 mmol/L — ABNORMAL LOW (ref 22–32)
CREATININE: 1.66 mg/dL — AB (ref 0.44–1.00)
Calcium: 7.7 mg/dL — ABNORMAL LOW (ref 8.9–10.3)
GFR calc Af Amer: 34 mL/min — ABNORMAL LOW (ref >60)
GFR, EST NON AFRICAN AMERICAN: 30 mL/min — AB (ref >60)
Glucose, Bld: 191 mg/dL — ABNORMAL HIGH (ref 65–99)
Potassium: 3.9 mmol/L (ref 3.5–5.1)
Sodium: 136 mmol/L (ref 135–145)
Total Protein: 6.3 g/dL — ABNORMAL LOW (ref 6.5–8.1)

## 2017-02-12 LAB — URINALYSIS, ROUTINE W REFLEX MICROSCOPIC
Bilirubin Urine: NEGATIVE
GLUCOSE, UA: NEGATIVE mg/dL
Ketones, ur: NEGATIVE mg/dL
NITRITE: NEGATIVE
PH: 5 (ref 5.0–8.0)
Protein, ur: 100 mg/dL — AB
Specific Gravity, Urine: 1.008 (ref 1.005–1.030)
Squamous Epithelial / LPF: NONE SEEN

## 2017-02-12 LAB — TSH: TSH: 2.129 u[IU]/mL (ref 0.350–4.500)

## 2017-02-12 LAB — T4, FREE: Free T4: 0.95 ng/dL (ref 0.61–1.12)

## 2017-02-12 LAB — LIPASE, BLOOD: LIPASE: 24 U/L (ref 11–51)

## 2017-02-12 MED ORDER — IPRATROPIUM-ALBUTEROL 0.5-2.5 (3) MG/3ML IN SOLN
3.0000 mL | Freq: Four times a day (QID) | RESPIRATORY_TRACT | Status: DC | PRN
Start: 2017-02-12 — End: 2017-02-15

## 2017-02-12 MED ORDER — VALACYCLOVIR HCL 500 MG PO TABS
500.0000 mg | ORAL_TABLET | Freq: Every day | ORAL | Status: DC
  Administered 2017-02-12 – 2017-02-14 (×3): 500 mg via ORAL
  Filled 2017-02-12 (×3): qty 1

## 2017-02-12 MED ORDER — ONDANSETRON HCL 4 MG/2ML IJ SOLN: 4.0000 mg | Freq: Four times a day (QID) | INTRAMUSCULAR | Status: DC | PRN

## 2017-02-12 MED ORDER — ACETAMINOPHEN 650 MG RE SUPP: 650.0000 mg | Freq: Four times a day (QID) | RECTAL | Status: DC | PRN

## 2017-02-12 MED ORDER — SODIUM CHLORIDE 0.9 % IV BOLUS (SEPSIS)
1000.0000 mL | Freq: Once | INTRAVENOUS | Status: AC
  Administered 2017-02-12: 1000 mL via INTRAVENOUS

## 2017-02-12 MED ORDER — ONDANSETRON HCL 4 MG PO TABS: 4.0000 mg | ORAL_TABLET | Freq: Four times a day (QID) | ORAL | Status: DC | PRN

## 2017-02-12 MED ORDER — DEXTROSE 5 % IV SOLN
1.0000 g | INTRAVENOUS | Status: DC
  Administered 2017-02-12 – 2017-02-14 (×3): 1 g via INTRAVENOUS
  Filled 2017-02-12 (×3): qty 10

## 2017-02-12 MED ORDER — LUBRIDERM SERIOUSLY SENSITIVE EX LOTN
1.0000 "application " | TOPICAL_LOTION | Freq: Three times a day (TID) | CUTANEOUS | Status: DC | PRN
  Filled 2017-02-12: qty 473

## 2017-02-12 MED ORDER — MAGIC MOUTHWASH W/LIDOCAINE
5.0000 mL | Freq: Four times a day (QID) | ORAL | Status: DC | PRN
  Filled 2017-02-12: qty 5

## 2017-02-12 MED ORDER — ACETAMINOPHEN 325 MG PO TABS: 650.0000 mg | ORAL_TABLET | Freq: Four times a day (QID) | ORAL | Status: DC | PRN

## 2017-02-12 MED ORDER — ENOXAPARIN SODIUM 40 MG/0.4ML ~~LOC~~ SOLN
40.0000 mg | SUBCUTANEOUS | Status: DC
  Administered 2017-02-12 – 2017-02-14 (×3): 40 mg via SUBCUTANEOUS
  Filled 2017-02-12 (×3): qty 0.4

## 2017-02-12 MED ORDER — CHLORHEXIDINE GLUCONATE 0.12 % MT SOLN
15.0000 mL | Freq: Two times a day (BID) | OROMUCOSAL | Status: DC
  Administered 2017-02-12 – 2017-02-14 (×5): 15 mL via OROMUCOSAL
  Filled 2017-02-12 (×5): qty 15

## 2017-02-12 MED ORDER — ORAL CARE MOUTH RINSE
15.0000 mL | Freq: Two times a day (BID) | OROMUCOSAL | Status: DC
  Administered 2017-02-14: 15 mL via OROMUCOSAL

## 2017-02-12 MED ORDER — SODIUM CHLORIDE 0.9 % IV SOLN: INTRAVENOUS | Status: DC

## 2017-02-12 MED ORDER — LIDOCAINE-PRILOCAINE 2.5-2.5 % EX CREA: 1.0000 "application " | TOPICAL_CREAM | Freq: Every day | CUTANEOUS | Status: DC | PRN

## 2017-02-12 NOTE — ED Notes (Signed)
Patient transported to X-ray 

## 2017-02-12 NOTE — ED Provider Notes (Signed)
[facility] DEPT Provider Note   CSN: [id number] Arrival date & time: 02/12/17  0120     History   Chief Complaint Chief Complaint  Patient presents with  . Weakness  . Dehydration    HPI Jasmine Hood is a 71 y.o. female.  Patient is a 71 year old female with past medical history of metastatic breast cancer followed by Dr. [name]. She presents today for evaluation of weakness. She reports decreased appetite, decreased by mouth intake, and increased weakness that has progressed over the past several days. She denies any fevers or chills. She denies any specific aches or pains. According to the daughter, she has been much less mobile.   The history is provided by the patient and a relative.  Weakness  Primary symptoms include no focal weakness, no loss of sensation. This is a new problem. The current episode started 2 days ago. The problem has been rapidly worsening. There was no focality noted. There has been no fever. Pertinent negatives include no shortness of breath, no chest pain and no vomiting.    Past Medical History:  Diagnosis Date  . Breast cancer ([place]) 10/07/2016   bilateral breast   . Owens Shark recluse spider bite    right leg  . COLONIC POLYPS, HX OF 01/05/2009   Qualifier: Diagnosis of  By: [name]  MD, [name]   . Complication of anesthesia    slow to awaken after wisdom  teeth extraction age 53  . Genetic testing 11/27/2016   Ms. Zettlemoyer underwent genetic counseling and testing for hereditary cancer syndromes on 11/19/2016. Her results were negative for mutations in all 46 genes analyzed by [employer]'s 46-gene Common Hereditary Cancers Panel. Genes analyzed include: APC, ATM, AXIN2, BARD1, BMPR1A, BRCA1, BRCA2, BRIP1, CDH1, CDKN2A, CHEK2, CTNNA1, DICER1, EPCAM, GREM1, HOXB13, KIT, MEN1, MLH1, MSH2, MSH3, MSH6, MUTYH, NBN,  . HYPOTHYROIDISM 02/01/2010   Qualifier: Diagnosis of  By: [name]  MD, [name]   . Morbid obesity ([place]) 01/05/2009   Qualifier:  Diagnosis of  By: [name]  MD, [name]   . Pneumonia 1987  . Sleep apnea    no cpap used     Patient Active Problem List   Diagnosis Date Noted  . Genetic testing 11/27/2016  . Port catheter in place 11/14/2016  . Bone metastases ([place]) 11/11/2016  . Morbid obesity with BMI of 50.0-59.9, adult ([place]) 11/05/2016  . Cirrhosis of liver ([place]) 11/05/2016  . Malignant neoplasm of overlapping sites of right breast in female, estrogen receptor positive ([place]) 10/21/2016  . Encounter for follow-up surveillance of colon cancer   . Benign neoplasm of ascending colon   . Benign neoplasm of descending colon   . Benign neoplasm of transverse colon   . Benign neoplasm of cecum   . OSA (obstructive sleep apnea) 09/14/2012  . Contact dermatitis and eczema due to plant 12/31/2011  . Hypothyroidism 02/01/2010  . Essential hypertension 01/05/2009  . History of colonic polyps 01/05/2009    Past Surgical History:  Procedure Laterality Date  . BREAST BIOPSY Right 10/07/2016   invasive mammary carcinoma  . BREAST BIOPSY Left 10/08/2016   left  breast fibroadenoma no malignancy  . CATARACT EXTRACTION Bilateral   . COLONOSCOPY WITH PROPOFOL N/A 12/22/2015   Procedure: COLONOSCOPY WITH PROPOFOL;  Surgeon: [name], MD;  Location: [facility];  Service: Endoscopy;  Laterality: N/A;  . colonscopy  2012   with polyp removed  . MOLE REMOVAL  10/29/2016   Procedure: MOLE REMOVAL LEFT CHEST;  Surgeon: Alphonsa Overall, MD;  Location: [facility];  Service: General;;  . PORTACATH PLACEMENT N/A 10/29/2016   Procedure: INSERTION PORT-A-CATH;  Surgeon: Alphonsa Overall, MD;  Location: [facility];  Service: General;  Laterality: N/A;  . surgery for spider bite  1998  . TUBAL LIGATION    . WISDOM TOOTH EXTRACTION  age 36    OB History    No data available       Home Medications    Prior to Admission medications   Medication Sig Start Date End Date Taking? Authorizing Provider  acetaminophen (TYLENOL) 500  MG tablet Take 500 mg by mouth daily as needed for moderate pain or headache.    [provider]  dexamethasone (DECADRON) 4 MG tablet Take 2 tablets (8 mg total) by mouth daily. Start the day after chemotherapy for 2 days. Take with food. 12/17/16   Gardenia Phlegm, NP  EPINEPHrine (EPIPEN 2-PAK) 0.3 mg/0.3 mL SOAJ injection Inject 0.3 mLs (0.3 mg total) into the muscle once. 08/24/13   Marletta Lor, MD  fluconazole (DIFLUCAN) 100 MG tablet Take 2 tablets (200 mg total) by mouth daily. Take for 10 days following treatment, every 21 days 12/24/16   Gardenia Phlegm, NP  lidocaine-prilocaine (EMLA) cream Apply 1 application topically as needed. 01/28/17   Magrinat, Virgie Dad, MD  magic mouthwash w/lidocaine SOLN Take 5 mLs by mouth 4 (four) times daily as needed for mouth pain. 11/12/16   Gardenia Phlegm, NP  promethazine (PHENERGAN) 25 MG suppository Place 1 suppository (25 mg total) rectally every 6 (six) hours as needed for nausea or vomiting. 11/26/16   Magrinat, Virgie Dad, MD  triamcinolone ointment (KENALOG) 0.5 % Apply 1 application topically 2 (two) times daily. 11/14/16   Gardenia Phlegm, NP  valACYclovir (VALTREX) 500 MG tablet Take 500 mg by mouth 2 (two) times daily.    [provider]    Family History Family History  Problem Relation Age of Onset  . Breast cancer Mother 65       d.52 from metastatic disease  . Throat cancer Father        d.65 history of smoking  . Rheum arthritis Sister        3 sisters pos. for osteo and RA  . Diabetes Maternal Aunt   . Cancer Maternal Grandfather        mouth cancer-chewed tobacco  . Breast cancer Cousin 73  . Breast cancer Daughter 92  . Stomach cancer Sister 82    Social History Social History  Substance Use Topics  . Smoking status: Never Smoker  . Smokeless tobacco: Never Used  . Alcohol use No     Allergies   Bee venom   Review of Systems Review of Systems  Respiratory:  Negative for shortness of breath.   Cardiovascular: Negative for chest pain.  Gastrointestinal: Negative for vomiting.  Neurological: Positive for weakness. Negative for focal weakness.  All other systems reviewed and are negative.    Physical Exam Updated Vital Signs SpO2 98%   Physical Exam  Constitutional: She is oriented to person, place, and time. No distress.  Patient is chronically ill appearing. She appears very fatigued.  HENT:  Head: Normocephalic and atraumatic.  Mucous membranes are dry.  Eyes: Pupils are equal, round, and reactive to light. EOM are normal.  Neck: Normal range of motion. Neck supple.  Cardiovascular: Normal rate and regular rhythm.  Exam reveals no gallop and no friction rub.   No  murmur heard. Pulmonary/Chest: Effort normal and breath sounds normal. No respiratory distress. She has no wheezes.  Abdominal: Soft. Bowel sounds are normal. She exhibits no distension. There is no tenderness.  Musculoskeletal: Normal range of motion. She exhibits no edema.  Lymphadenopathy:    She has no cervical adenopathy.  Neurological: She is alert and oriented to person, place, and time.  Skin: Skin is warm and dry. She is not diaphoretic.  Nursing note and vitals reviewed.    ED Treatments / Results  Labs (all labs ordered are listed, but only abnormal results are displayed) Labs Reviewed  COMPREHENSIVE METABOLIC PANEL  LIPASE, BLOOD  CBC WITH DIFFERENTIAL/PLATELET  URINALYSIS, ROUTINE W REFLEX MICROSCOPIC    EKG  EKG Interpretation None       Radiology No results found.  Procedures Procedures (including critical care time)  Medications Ordered in ED Medications  sodium chloride 0.9 % bolus 1,000 mL (not administered)     Initial Impression / Assessment and Plan / ED Course  I have reviewed the triage vital signs and the nursing notes.  Pertinent labs & imaging results that were available during my care of the patient were reviewed by me  and considered in my medical decision making (see chart for details).  Patient presents with increased weakness over the past several days. She has a history of breast cancer that is metastatic. According to the daughter, she has been weak and having difficulty walking. She is also not been eating or drinking for the past several days. Her electrolytes reflect dehydration and acute kidney injury. She was given saline here in the ER and will be admitted to the hospitalist service for further hydration and care.  Final Clinical Impressions(s) / ED Diagnoses   Final diagnoses:  None    New Prescriptions New Prescriptions   No medications on file     Veryl Speak, MD 02/12/17 [phone]

## 2017-02-12 NOTE — H&P (Signed)
History and Physical    DERRY ARBOGAST ESP:[id number] DOB: Mar 06, 1946 DOA: 02/12/2017  Referring MD/NP/PA: Dr. Stark Jock PCP: Marletta Lor, MD  Patient coming from: Home via EMS  Chief Complaint: Weakness  HPI: Jasmine Hood is a 71 y.o. female with medical history significant of metastatic breast cancer followed by Dr. [name]; who presents with complaints of weakness. Patient notes being significantly fatigued over the last 4 days. According decreased appetite and poor oral intake. Reports going out to her car today, but was unable to get out of the car and sat there for over 5 hours. The family friend came over to check on her and noted that she seemed not like her normal self and called EMS. Patient also notes that her Port-A-Cath "beats" when she needs to lay down.  ED Course: On admission into the emergency department patient was seen to have a temperature of 99.5F, blood pressure 140/78, pulse 81-101, respirations 28, O2 saturation 99% on room air. Labs revealed  Review of Systems  Constitutional: Positive for malaise/fatigue. Negative for chills and fever.  HENT: Negative for ear pain and nosebleeds.   Eyes: Negative for photophobia and pain.  Respiratory: Negative for hemoptysis.   Cardiovascular: Negative for orthopnea and claudication.  Gastrointestinal: Negative for abdominal pain, nausea and vomiting.  Genitourinary: Positive for dysuria and frequency.  Musculoskeletal: Negative for falls.  Skin: Negative for rash.  Neurological: Positive for weakness.  Psychiatric/Behavioral: Negative for substance abuse.  All other systems reviewed and are negative.   Past Medical History:  Diagnosis Date  . Breast cancer ([place]) 10/07/2016   bilateral breast   . Owens Shark recluse spider bite    right leg  . COLONIC POLYPS, HX OF 01/05/2009   Qualifier: Diagnosis of  By: [name]  MD, [name]   . Complication of anesthesia    slow to awaken after wisdom  teeth extraction age 49    . Genetic testing 11/27/2016   Ms. Sider underwent genetic counseling and testing for hereditary cancer syndromes on 11/19/2016. Her results were negative for mutations in all 46 genes analyzed by [facility]'s 46-gene Common Hereditary Cancers Panel. Genes analyzed include: APC, ATM, AXIN2, BARD1, BMPR1A, BRCA1, BRCA2, BRIP1, CDH1, CDKN2A, CHEK2, CTNNA1, DICER1, EPCAM, GREM1, HOXB13, KIT, MEN1, MLH1, MSH2, MSH3, MSH6, MUTYH, NBN,  . HYPOTHYROIDISM 02/01/2010   Qualifier: Diagnosis of  By: [name]  MD, [name]   . Morbid obesity ([place]) 01/05/2009   Qualifier: Diagnosis of  By: [name]  MD, [name]   . Pneumonia 1987  . Sleep apnea    no cpap used     Past Surgical History:  Procedure Laterality Date  . BREAST BIOPSY Right 10/07/2016   invasive mammary carcinoma  . BREAST BIOPSY Left 10/08/2016   left  breast fibroadenoma no malignancy  . CATARACT EXTRACTION Bilateral   . COLONOSCOPY WITH PROPOFOL N/A 12/22/2015   Procedure: COLONOSCOPY WITH PROPOFOL;  Surgeon: [name], MD;  [facility];  Service: Endoscopy;  Laterality: N/A;  . colonscopy  2012   with polyp removed  . MOLE REMOVAL  10/29/2016   Procedure: MOLE REMOVAL LEFT CHEST;  Surgeon: Alphonsa Overall, MD;  [facility];  Service: General;;  . PORTACATH PLACEMENT N/A 10/29/2016   Procedure: INSERTION PORT-A-CATH;  Surgeon: Alphonsa Overall, MD;  [facility];  Service: General;  Laterality: N/A;  . surgery for spider bite  1998  . TUBAL LIGATION    . WISDOM TOOTH EXTRACTION  age 89  reports that she has never smoked. She has never used smokeless tobacco. She reports that she does not drink alcohol or use drugs.  Allergies  Allergen Reactions  . Bee Venom Anaphylaxis    Family History  Problem Relation Age of Onset  . Breast cancer Mother 63       d.52 from metastatic disease  . Throat cancer Father        d.65 history of smoking  . Rheum arthritis Sister        3 sisters pos. for osteo and  RA  . Diabetes Maternal Aunt   . Cancer Maternal Grandfather        mouth cancer-chewed tobacco  . Breast cancer Cousin 29  . Breast cancer Daughter 46  . Stomach cancer Sister 55    Prior to Admission medications   Medication Sig Start Date End Date Taking? Authorizing Provider  lidocaine-prilocaine (EMLA) cream Apply 1 application topically as needed. Patient taking differently: Apply 1 application topically daily as needed. Port Access 01/28/17  Yes Magrinat, Virgie Dad, MD  magic mouthwash w/lidocaine SOLN Take 5 mLs by mouth 4 (four) times daily as needed for mouth pain. 11/12/16  Yes Causey, Charlestine Massed, NP  Skin Protectants, Misc. Adonis Housekeeper EX) Apply 1 application topically 3 (three) times daily as needed. Redness   Yes [provider]  triamcinolone ointment (KENALOG) 0.5 % Apply 1 application topically 2 (two) times daily. 11/14/16  Yes Causey, Charlestine Massed, NP  valACYclovir (VALTREX) 500 MG tablet Take 500 mg by mouth daily.    Yes [provider]  dexamethasone (DECADRON) 4 MG tablet Take 2 tablets (8 mg total) by mouth daily. Start the day after chemotherapy for 2 days. Take with food. Patient not taking: Reported on 02/12/2017 12/17/16   Gardenia Phlegm, NP  EPINEPHrine (EPIPEN 2-PAK) 0.3 mg/0.3 mL SOAJ injection Inject 0.3 mLs (0.3 mg total) into the muscle once. Patient taking differently: Inject 0.3 mg into the muscle daily as needed. Allergic Reaction 08/24/13   Marletta Lor, MD  fluconazole (DIFLUCAN) 100 MG tablet Take 2 tablets (200 mg total) by mouth daily. Take for 10 days following treatment, every 21 days Patient not taking: Reported on 02/12/2017 12/24/16   Gardenia Phlegm, NP  promethazine (PHENERGAN) 25 MG suppository Place 1 suppository (25 mg total) rectally every 6 (six) hours as needed for nausea or vomiting. Patient not taking: Reported on 02/12/2017 11/26/16   Magrinat, Virgie Dad, MD    Physical  Exam:  Constitutional: Chronically ill-appearing obese elderly female Vitals:   02/12/17 0126 02/12/17 0430 02/12/17 0500 02/12/17 0530  BP:  (!) 143/110 139/75 (!) 145/69  Pulse:  (!) 101 97 95  Resp:  (!) 27 (!) 32 (!) 23  SpO2: 98% 95% 96% 96%   Eyes: PERRL, lids and conjunctivae normal ENMT: Mucous membranes are dry. Posterior pharynx clear of any exudate or lesions.  Neck: normal, supple, no masses, no thyromegaly Respiratory: clear to auscultation bilaterally, no wheezing, no crackles. Normal respiratory effort. No accessory muscle use.  Cardiovascular: Regular rate and rhythm, no murmurs / rubs / gallops. No extremity edema. 2+ pedal pulses. No carotid bruits.  Left upper chest wall Port-A-Cath in place. Abdomen: no tenderness, no masses palpated. No hepatosplenomegaly. Bowel sounds positive.  Musculoskeletal: no clubbing / cyanosis. No joint deformity upper and lower extremities. Good ROM, no contractures. Normal muscle tone.  Skin: no rashes, lesions, ulcers. No induration Neurologic: CN 2-12 grossly intact. Sensation intact, DTR normal. Strength  5/5 in all 4.  Psychiatric: Normal judgment and insight. Alert and oriented x 3. Normal mood.     Labs on Admission: I have personally reviewed following labs and imaging studies  CBC:  Recent Labs Lab 02/12/17 0310  WBC 9.0  NEUTROABS 6.9  HGB 9.1*  HCT 26.6*  MCV 97.8  PLT 202*   Basic Metabolic Panel:  Recent Labs Lab 02/12/17 0310  NA 136  K 3.9  CL 100*  CO2 20*  GLUCOSE 191*  BUN 39*  CREATININE 1.66*  CALCIUM 7.7*   GFR: CrCl cannot be calculated (Unknown ideal weight.). Liver Function Tests:  Recent Labs Lab 02/12/17 0310  AST 87*  ALT 47  ALKPHOS 105  BILITOT 1.1  PROT 6.3*  ALBUMIN 2.3*    Recent Labs Lab 02/12/17 0310  LIPASE 24   No results for input(s): AMMONIA in the last 168 hours. Coagulation Profile: No results for input(s): INR, PROTIME in the last 168 hours. Cardiac  Enzymes: No results for input(s): CKTOTAL, CKMB, CKMBINDEX, TROPONINI in the last 168 hours. BNP (last 3 results) No results for input(s): PROBNP in the last 8760 hours. HbA1C: No results for input(s): HGBA1C in the last 72 hours. CBG: No results for input(s): GLUCAP in the last 168 hours. Lipid Profile: No results for input(s): CHOL, HDL, LDLCALC, TRIG, CHOLHDL, LDLDIRECT in the last 72 hours. Thyroid Function Tests: No results for input(s): TSH, T4TOTAL, FREET4, T3FREE, THYROIDAB in the last 72 hours. Anemia Panel: No results for input(s): VITAMINB12, FOLATE, FERRITIN, TIBC, IRON, RETICCTPCT in the last 72 hours. Urine analysis:    Component Value Date/Time   COLORURINE LT. YELLOW 01/25/2010 0858   APPEARANCEUR CLEAR 01/25/2010 0858   LABSPEC >=1.030 01/25/2010 0858   PHURINE 5.5 01/25/2010 0858   GLUCOSEU NEGATIVE 01/25/2010 0858   HGBUR 1+ 12/30/2008 0838   BILIRUBINUR Neg 04/28/2015 1122   KETONESUR 15 (?) 01/25/2010 0858   PROTEINUR Neg 04/28/2015 1122   PROTEINUR NEGATIVE 11/24/2008 1545   UROBILINOGEN 0.2 04/28/2015 1122   UROBILINOGEN 0.2 01/25/2010 0858   NITRITE Neg 04/28/2015 1122   NITRITE NEGATIVE 01/25/2010 0858   LEUKOCYTESUR small (1+) (A) 04/28/2015 1122   Sepsis Labs: No results found for this or any previous visit (from the past 240 hour(s)).   Radiological Exams on Admission: Dg Chest 2 View  Result Date: 02/12/2017 CLINICAL DATA:  71 y/o F; week with altered mental status. Metastatic breast cancer. EXAM: CHEST  2 VIEW COMPARISON:  10/29/2016 chest radiograph FINDINGS: Left port catheter tip projects over lower SVC. Mild S-shaped curvature of the spine. No acute osseous abnormality is evident. Clear lungs. No pleural effusion or pneumothorax. Stable normal cardiac silhouette. IMPRESSION: No acute pulmonary process identified. Electronically Signed   By: Kristine Garbe M.D.   On: 02/12/2017 02:27    EKG: Independently reviewed. Sinus tachycardia  at 113 bpm  Assessment/Plan Generalized weakness and dehydration: Acute. Patient reports recent decrease oral intake and generalized weakness.  - Admit to a MedSurg bed -  PT to eval and treat  Acute kidney injury: Patient's baseline creatinine previously noted to be within normal limits at 0.7-0.8. Patient presents with a creatinine of 1.66 and BUN 39. The elevated BUN to creatinine ratio suggests prerenal cause of symptoms. - In and out cath for urinalysis  - Will need to start on antibiotics if found to have positive signs of infection - Normal saline IV fluids at 100 ml/hr   Metastatic breast cancer: Patient neoadjuvant treatment to consist  of cyclophosphamide, methotrexate, and fluorouracil (CMF) chemotherapy with last infusion on 9/11. Patient under the care of Dr. [name] of oncology and reports having an appointment with him this morning. Patient noted to have metastatic disease to the thoracic spine on MRI from 11/11/2016. - Please make sure that Dr. [name] is aware that the patient is admitted to the hospital - Continue Valtrex  Pancytopenia: Patient with hemoglobin 9.1 and platelet count 130. No reports of bleeding. Symptoms could be related to chemotherapy treatments.   Abnormal TSH: Patient noted have abnormal TSH of 5.64 back in 04/2015 - Check TSH and free T4   DVT prophylaxis:  lovenox Code Status:  Full Family Communication: Discussed plan of care with the patient about her present at bedside  Disposition Plan: TBD Consults called: none Admission status: observation  Norval Morton MD [facility] Pager [phone]   If 7PM-7AM, please contact night-coverage [email or web address] Password [facility]  02/12/2017, 5:17 AM

## 2017-02-12 NOTE — Care Management Note (Signed)
Case Management Note  Patient Details  Name: Jasmine Hood MRN: [id number] Date of Birth: 1946-05-07  Subjective/Objective:      71 yo admitted with AKI. Hx of metastatic breast cancer              Action/Plan: From home with daughter. CM consult for home health services. PT recommendations gone over with patient. Patient politely declines any home health services. 3in1 was requested. 3in1 order received and [facility] rep alerted of 3in1 order.  Expected Discharge Date:   (UNKNOWN)               Expected Discharge Plan:  Home/Self Care  In-House Referral:     Discharge planning Services  CM Consult  Post Acute Care Choice:    Choice offered to:     DME Arranged:  3-N-1 DME Agency:     HH Arranged:    HH Agency:     Status of Service:  In process, will continue to follow  If discussed at Long Length of Stay Meetings, dates discussed:    Additional CommentsLynnell Catalan, RN 02/12/2017, 1:54 PM  [phone]

## 2017-02-12 NOTE — Progress Notes (Signed)
  PROGRESS NOTE  Patient admitted earlier this morning. See H&P. Jasmine Hood is a 71 y.o. female with medical history significant of metastatic breast cancer followed by Dr. [name]; who presents with complaints of weakness. Patient notes being significantly fatigued over the last 4 days with decreased appetite and poor oral intake. She reported that she was going out to her car, but was unable to get out of the car and sat there for over 5 hours. The family friend came over to check on her and noted that she seemed not like her normal self and called EMS. Work up revealed AKI with Cr 1.66 from baseline of 0.7-0.8 as well as UTI.   IVF Rocephin for UTI, await culture result  PT evaluation    Dessa Phi, DO [facility] [email or web address] Password [email or web address] 02/12/2017, 1:14 PM

## 2017-02-12 NOTE — Care Management Obs Status (Signed)
[facility] NOTIFICATION   Patient Details  Name: Jasmine Hood MRN: [id number] Date of Birth: 20-Nov-1945   Medicare Observation Status Notification Given:       Lynnell Catalan, RN 02/12/2017, 1:49 PM

## 2017-02-12 NOTE — ED Notes (Signed)
Report given to floor RN

## 2017-02-12 NOTE — Evaluation (Addendum)
Clinical/Bedside Swallow Evaluation Patient Details  Name: Jasmine Hood MRN: [id number] Date of Birth: 03-29-1946  Today's Date: 02/12/2017 Time: SLP Start Time (ACUTE ONLY): 1308 SLP Stop Time (ACUTE ONLY): 1342 SLP Time Calculation (min) (ACUTE ONLY): 34 min  Past Medical History:  Past Medical History:  Diagnosis Date  . Breast cancer ([place]) 10/07/2016   bilateral breast   . [place] recluse spider bite    right leg  . COLONIC POLYPS, HX OF 01/05/2009   Qualifier: Diagnosis of  By: [name]  MD, [name]   . Complication of anesthesia    slow to awaken after wisdom  teeth extraction age 48  . Genetic testing 11/27/2016   Ms. Maclellan underwent genetic counseling and testing for hereditary cancer syndromes on 11/19/2016. Her results were negative for mutations in all 46 genes analyzed by [facility]'s 46-gene Common Hereditary Cancers Panel. Genes analyzed include: APC, ATM, AXIN2, BARD1, BMPR1A, BRCA1, BRCA2, BRIP1, CDH1, CDKN2A, CHEK2, CTNNA1, DICER1, EPCAM, GREM1, HOXB13, KIT, MEN1, MLH1, MSH2, MSH3, MSH6, MUTYH, NBN,  . HYPOTHYROIDISM 02/01/2010   Qualifier: Diagnosis of  By: [name]  MD, [name]   . Morbid obesity ([place]) 01/05/2009   Qualifier: Diagnosis of  By: [name]  MD, [name]   . Pneumonia 1987  . Sleep apnea    no cpap used    Past Surgical History:  Past Surgical History:  Procedure Laterality Date  . BREAST BIOPSY Right 10/07/2016   invasive mammary carcinoma  . BREAST BIOPSY Left 10/08/2016   left  breast fibroadenoma no malignancy  . CATARACT EXTRACTION Bilateral   . COLONOSCOPY WITH PROPOFOL N/A 12/22/2015   Procedure: COLONOSCOPY WITH PROPOFOL;  Surgeon: [name], MD;  Location: [facility];  Service: Endoscopy;  Laterality: N/A;  . colonscopy  2012   with polyp removed  . MOLE REMOVAL  10/29/2016   Procedure: MOLE REMOVAL LEFT CHEST;  Surgeon: Alphonsa Overall, MD;  Location: [facility];  Service: General;;  . PORTACATH PLACEMENT N/A 10/29/2016   Procedure: INSERTION PORT-A-CATH;  Surgeon: Alphonsa Overall, MD;  Location: [facility];  Service: General;  Laterality: N/A;  . surgery for spider bite  1998  . TUBAL LIGATION    . WISDOM TOOTH EXTRACTION  age 9   HPI:  71 yo female adm to [facility] with weakness/fatigue.  Pt has stage IV breast cancer and is undergoing chemotherapy at this time.  Swallow eval ordered.  CXR negative for pulmonary infection.   Assessment / Plan / Recommendation Clinical Impression  Pt presents with functional oropharyngeal swallow ability.  No indication of aspiration or airway compromise noted.  Pt able to self feed and observed consuming 3 containers of cranberry juice, container of pudding, and 1/2 cheese stick.  She was able to consume 4 ounces juice quickly.  SlP did advise her to oral residuals posterior tongue on right which she cleared with cued swallow.  Educated her to general aspiration precautions and dysphagia mitigation strategies.   She appears mildly dysarthric, uncertain to baseline but she denies changes and her speech is intelligible.  SLP Visit Diagnosis: Dysphagia, oral phase (R13.11)    Aspiration Risk  Mild aspiration risk    Diet Recommendation Regular;Thin liquid   Liquid Administration via: Cup;No straw (per pt/Dr Lucia Gaskins) Medication Administration: Whole meds with liquid Supervision: Patient able to self feed Compensations: Slow rate;Small sips/bites;Lingual sweep for clearance of pocketing Postural Changes: Seated upright at 90 degrees    Other  Recommendations Oral Care Recommendations: Oral care BID  Follow up Recommendations None      Frequency and Duration   n/a         Prognosis   n/a     Swallow Study   General Date of Onset: 02/12/17 HPI: 71 yo female adm to [facility] with weakness/fatigue.  Pt has stage IV breast cancer and is undergoing chemotherapy at this time.  Swallow eval ordered.  CXR negative for pulmonary infection. Type of Study: Bedside Swallow Evaluation Diet  Prior to this Study: NPO Temperature Spikes Noted: No Respiratory Status: Room air History of Recent Intubation: No Behavior/Cognition: Alert;Cooperative;Pleasant mood Oral Cavity Assessment: Within Functional Limits (reddish dried area on tongue, ? due to prior pill administration) Oral Care Completed by SLP: No Oral Cavity - Dentition: Adequate natural dentition Vision: Functional for self-feeding Self-Feeding Abilities: Able to feed self Patient Positioning: Upright in bed Baseline Vocal Quality: Normal Volitional Cough: Weak Volitional Swallow: Able to elicit    Oral/Motor/Sensory Function Overall Oral Motor/Sensory Function: Mild impairment Lingual Strength: Suspected CN XII (hypoglossal) dysfunction   Ice Chips Ice chips: Not tested   Thin Liquid Thin Liquid: Within functional limits Presentation: Self Fed;Cup    Nectar Thick Nectar Thick Liquid: Not tested   Honey Thick Honey Thick Liquid: Not tested   Puree Puree: Within functional limits Presentation: Self Fed;Spoon   Solid   GO   Solid: Impaired Presentation: Self Fed;Spoon Oral Phase Impairments: Impaired mastication;Reduced lingual movement/coordination Oral Phase Functional Implications: Oral residue (left posterior )    Functional Assessment Tool Used: clinical judgement Functional Limitations: Swallowing Swallow Current Status ([id number]): At least 1 percent but less than 20 percent impaired, limited or restricted Swallow Goal Status [phone]): At least 1 percent but less than 20 percent impaired, limited or restricted Swallow Discharge Status [phone]): At least 1 percent but less than 20 percent impaired, limited or restricted   Macario Golds 02/12/2017,2:26 PM  Luanna Salk, [facility] SLP [phone]

## 2017-02-12 NOTE — ED Notes (Signed)
Pt brought in by EMS due to family and pt concerns about dehydration. Pt sat in car for five hours at home and did not want to exit car. On arrival, pt found sitting in wheelchair on the front lawn.  Hx of breast CA and pt currently receiving treatment.  Pt alert and oriented x4 according to EMS, but family states that something seems "off". 100 palpated BP, P-110, 98%-RA, R-16. CBG 119.

## 2017-02-12 NOTE — Evaluation (Signed)
Physical Therapy Evaluation Patient Details Name: Jasmine Hood MRN: [id number] DOB: [birth year] Today's Date: 02/12/2017   History of Present Illness  Pt admitted through ED with weakness and is currently undergoing tx for Breast CA  Clinical Impression  Pt admitted as above and presenting with functional mobility limitations 2* generalized weakness and ambulatory balance deficits.  Pt should progress to dc home with 24/7 assist of dtr.    Follow Up Recommendations Home health PT    Equipment Recommendations  3in1 (PT)    Recommendations for Other Services OT consult     Precautions / Restrictions Precautions Precautions: Fall Restrictions Weight Bearing Restrictions: No      Mobility  Bed Mobility Overal bed mobility: Needs Assistance Bed Mobility: Supine to Sit     Supine to sit: Min assist;Mod assist     General bed mobility comments: cues for sequence with physical assist to bring trunk to upright and complete transition to EOB sitting  Transfers Overall transfer level: Needs assistance Equipment used: Rolling walker (2 wheeled) Transfers: Sit to/from Stand Sit to Stand: Min assist;From elevated surface         General transfer comment: cues for transition position and use of UEs to self assist.  Assist to bring wt up and fwd and to steady in initial standing.  Ambulation/Gait Ambulation/Gait assistance: Min assist;+2 safety/equipment Ambulation Distance (Feet): 123 Feet Assistive device: Rolling walker (2 wheeled) Gait Pattern/deviations: Step-through pattern;Decreased step length - right;Decreased step length - left;Shuffle;Trunk flexed Gait velocity: decr Gait velocity interpretation: Below normal speed for age/gender General Gait Details: cues for posture and position from RW.  Physical assist for balance/stability and RW management  Stairs            Wheelchair Mobility    Modified Rankin (Stroke Patients Only)       Balance Overall  balance assessment: Needs assistance Sitting-balance support: No upper extremity supported;Feet supported Sitting balance-Leahy Scale: Good     Standing balance support: Bilateral upper extremity supported Standing balance-Leahy Scale: Poor                               Pertinent Vitals/Pain Pain Assessment: No/denies pain    Home Living Family/patient expects to be discharged to:: Private residence Living Arrangements: Children Available Help at Discharge: Family;Friend(s) Type of Home: Mobile home Home Access: Stairs to enter Entrance Stairs-Rails: Right;Left;Can reach both Entrance Stairs-Number of Steps: 4 Home Layout: One level Home Equipment: Walker - 2 wheels;Walker - 4 wheels;Wheelchair - manual Additional Comments: Pt states she is getting wc ramp and it may be installed by the time she returns home    Prior Function Level of Independence: Needs assistance   Gait / Transfers Assistance Needed: Pt ambulating ltd distance with assist of dtr  ADL's / Homemaking Assistance Needed: assist of dtr        Hand Dominance   Dominant Hand: Right    Extremity/Trunk Assessment   Upper Extremity Assessment Upper Extremity Assessment: Generalized weakness    Lower Extremity Assessment Lower Extremity Assessment: Generalized weakness    Cervical / Trunk Assessment Cervical / Trunk Assessment: Kyphotic  Communication   Communication: No difficulties  Cognition Arousal/Alertness: Awake/alert Behavior During Therapy: WFL for tasks assessed/performed Overall Cognitive Status: Within Functional Limits for tasks assessed  General Comments      Exercises     Assessment/Plan    PT Assessment Patient needs continued PT services  PT Problem List Decreased strength;Decreased activity tolerance;Decreased balance;Decreased mobility;Decreased knowledge of use of DME;Obesity       PT Treatment  Interventions DME instruction;Gait training;Stair training;Functional mobility training;Therapeutic activities;Therapeutic exercise;Patient/family education    PT Goals (Current goals can be found in the Care Plan section)  Acute Rehab PT Goals Patient Stated Goal: HOME PT Goal Formulation: With patient Time For Goal Achievement: 02/26/17 Potential to Achieve Goals: Fair    Frequency Min 3X/week   Barriers to discharge        Co-evaluation               AM-PAC PT "6 Clicks" Daily Activity  Outcome Measure Difficulty turning over in bed (including adjusting bedclothes, sheets and blankets)?: A Lot Difficulty moving from lying on back to sitting on the side of the bed? : Unable Difficulty sitting down on and standing up from a chair with arms (e.g., wheelchair, bedside commode, etc,.)?: Unable Help needed moving to and from a bed to chair (including a wheelchair)?: A Little Help needed walking in hospital room?: A Little Help needed climbing 3-5 steps with a railing? : A Lot 6 Click Score: 12    End of Session Equipment Utilized During Treatment: Gait belt Activity Tolerance: Patient tolerated treatment well;Patient limited by fatigue Patient left: in chair;with call bell/phone within reach;with chair alarm set Nurse Communication: Mobility status PT Visit Diagnosis: Unsteadiness on feet (R26.81);Muscle weakness (generalized) (M62.81);Difficulty in walking, not elsewhere classified (R26.2)    Time: [id number] PT Time Calculation (min) (ACUTE ONLY): 25 min   Charges:   PT Evaluation $PT Eval Low Complexity: 1 Low PT Treatments $Gait Training: 8-22 mins   PT G Codes:   PT G-Codes **NOT FOR INPATIENT CLASS** Functional Assessment Tool Used: AM-PAC 6 Clicks Basic Mobility Functional Limitation: Mobility: Walking and moving around Mobility: Walking and Moving Around Current Status ([id number]): At least 60 percent but less than 80 percent impaired, limited or  restricted Mobility: Walking and Moving Around Goal Status [phone]): At least 20 percent but less than 40 percent impaired, limited or restricted    Pg [phone]   Demaris Leavell 02/12/2017, 1:05 PM

## 2017-02-13 DIAGNOSIS — N179 Acute kidney failure, unspecified: Secondary | ICD-10-CM | POA: Diagnosis not present

## 2017-02-13 LAB — BASIC METABOLIC PANEL
Anion gap: 9 (ref 5–15)
BUN: 19 mg/dL (ref 6–20)
CALCIUM: 6.6 mg/dL — AB (ref 8.9–10.3)
CHLORIDE: 105 mmol/L (ref 101–111)
CO2: 23 mmol/L (ref 22–32)
CREATININE: 0.67 mg/dL (ref 0.44–1.00)
GFR calc non Af Amer: >60 mL/min (ref >60)
Glucose, Bld: 142 mg/dL — ABNORMAL HIGH (ref 65–99)
Potassium: 3 mmol/L — ABNORMAL LOW (ref 3.5–5.1)
Sodium: 137 mmol/L (ref 135–145)

## 2017-02-13 LAB — CBC
HEMATOCRIT: 21 % — AB (ref 36.0–46.0)
HEMOGLOBIN: 7.1 g/dL — AB (ref 12.0–15.0)
MCH: 33.8 pg (ref 26.0–34.0)
MCHC: 33.8 g/dL (ref 30.0–36.0)
MCV: 100 fL (ref 78.0–100.0)
Platelets: 112 10*3/uL — ABNORMAL LOW (ref 150–400)
RBC: 2.1 MIL/uL — ABNORMAL LOW (ref 3.87–5.11)
RDW: 14.9 % (ref 11.5–15.5)
WBC: 7.7 10*3/uL (ref 4.0–10.5)

## 2017-02-13 MED ORDER — POTASSIUM CHLORIDE CRYS ER 20 MEQ PO TBCR
40.0000 meq | EXTENDED_RELEASE_TABLET | ORAL | Status: AC
  Administered 2017-02-13 (×2): 40 meq via ORAL
  Filled 2017-02-13 (×2): qty 2

## 2017-02-13 NOTE — Progress Notes (Signed)
PROGRESS NOTE    Jasmine Hood  ZDG:[id number] DOB: 1945-06-23 DOA: 02/12/2017 PCP: Marletta Lor, MD     Brief Narrative:  Jasmine Hood a 72 y.o.femalewith medical history significant of metastatic breast cancer followed by Dr. [name]; who presents with complaints of weakness. Patient notes being significantly fatigued over the last 4 days with decreased appetite and poor oral intake. She reported that she was going out to her car, but was unable to get out of the car and sat there for over 5 hours. The family friend came over to check on her and noted that she seemed not like hernormal self and called EMS. Work up revealed AKI with Cr 1.66 from baseline of 0.7-0.8 as well as UTI.   Assessment & Plan:   Principal Problem:   AKI (acute kidney injury) ([place]) Active Problems:   Malignant neoplasm of overlapping sites of right breast in female, estrogen receptor positive ([place])   Dehydration   Abnormal TSH   Acute kidney injury -Treated with IV fluids, resolved this morning  Sepsis secondary to urinary tract infection, present on admission -Continue empiric Rocephin, urine culture pending  Generalized weakness -Treat underlying infection and AKI. PT eval   Hypokalemia -Replace, trend   Metastatic breast cancer -Followed by Dr. [name]     DVT prophylaxis: lovenox Code Status: full Family Communication: daughter at bedside Disposition Plan: pending improvement, urine culture result   Consultants:   None  Procedures:   None   Antimicrobials:  Anti-infectives    Start     Dose/Rate Route Frequency Ordered Stop   02/12/17 1000  valACYclovir (VALTREX) tablet 500 mg     500 mg Oral Daily 02/12/17 0612     02/12/17 1000  cefTRIAXone (ROCEPHIN) 1 g in dextrose 5 % 50 mL IVPB     1 g 100 mL/hr over 30 Minutes Intravenous Every 24 hours 02/12/17 0823          Subjective: Feeling better today, still pretty weak. Daughter at bedside states that  patient's speech gets mumbled during acute illnesses. She feels that her speech is much better today. She has no other complaints, denies any nausea or vomiting, chest pain or shortness of breath.  Objective: Vitals:   02/12/17 0855 02/12/17 1500 02/12/17 2027 02/13/17 0426  BP: 133/81 (!) 146/80 139/82 (!) 143/76  Pulse: 96 100 97 (!) 104  Resp: 20 20 20 20   Temp: 97.8 F (36.6 C) 98 F (36.7 C) 97.9 F (36.6 C) 98.4 F (36.9 C)  TempSrc: Oral Oral Oral Oral  SpO2: 100% 100% 100% 96%  Weight: 101.8 kg (224 lb 6.9 oz)     Height: 4\' 11"  (1.499 m)       Intake/Output Summary (Last 24 hours) at 02/13/17 1344 Last data filed at 02/12/17 1534  Gross per 24 hour  Intake              290 ml  Output                0 ml  Net              290 ml   Filed Weights   02/12/17 0855  Weight: 101.8 kg (224 lb 6.9 oz)    Examination:  General exam: Appears calm and comfortable  Respiratory system: Clear to auscultation. Respiratory effort normal. Cardiovascular system: S1 & S2 heard, RRR. No JVD, murmurs, rubs, gallops or clicks. No pedal edema. Gastrointestinal system: Abdomen is nondistended, soft and  nontender. No organomegaly or masses felt. Normal bowel sounds heard. Central nervous system: Alert and oriented. No focal neurological deficits. Extremities: Symmetric 5 x 5 power. Skin: No rashes, lesions or ulcers Psychiatry: Judgement and insight appear normal. Mood & affect appropriate.   Data Reviewed: I have personally reviewed following labs and imaging studies  CBC:  Recent Labs Lab 02/12/17 0310 02/13/17 0600  WBC 9.0 7.7  NEUTROABS 6.9  --   HGB 9.1* 7.1*  HCT 26.6* 21.0*  MCV 97.8 100.0  PLT 130* 720*   Basic Metabolic Panel:  Recent Labs Lab 02/12/17 0310 02/13/17 0600  NA 136 137  K 3.9 3.0*  CL 100* 105  CO2 20* 23  GLUCOSE 191* 142*  BUN 39* 19  CREATININE 1.66* 0.67  CALCIUM 7.7* 6.6*   GFR: Estimated Creatinine Clearance: 65.8 mL/min (by C-G  formula based on SCr of 0.67 mg/dL). Liver Function Tests:  Recent Labs Lab 02/12/17 0310  AST 87*  ALT 47  ALKPHOS 105  BILITOT 1.1  PROT 6.3*  ALBUMIN 2.3*    Recent Labs Lab 02/12/17 0310  LIPASE 24   No results for input(s): AMMONIA in the last 168 hours. Coagulation Profile: No results for input(s): INR, PROTIME in the last 168 hours. Cardiac Enzymes: No results for input(s): CKTOTAL, CKMB, CKMBINDEX, TROPONINI in the last 168 hours. BNP (last 3 results) No results for input(s): PROBNP in the last 8760 hours. HbA1C: No results for input(s): HGBA1C in the last 72 hours. CBG: No results for input(s): GLUCAP in the last 168 hours. Lipid Profile: No results for input(s): CHOL, HDL, LDLCALC, TRIG, CHOLHDL, LDLDIRECT in the last 72 hours. Thyroid Function Tests:  Recent Labs  02/12/17 1500  TSH 2.129  FREET4 0.95   Anemia Panel: No results for input(s): VITAMINB12, FOLATE, FERRITIN, TIBC, IRON, RETICCTPCT in the last 72 hours. Sepsis Labs: No results for input(s): PROCALCITON, LATICACIDVEN in the last 168 hours.  No results found for this or any previous visit (from the past 240 hour(s)).     Radiology Studies: Dg Chest 2 View  Result Date: 02/12/2017 CLINICAL DATA:  71 y/o F; week with altered mental status. Metastatic breast cancer. EXAM: CHEST  2 VIEW COMPARISON:  10/29/2016 chest radiograph FINDINGS: Left port catheter tip projects over lower SVC. Mild S-shaped curvature of the spine. No acute osseous abnormality is evident. Clear lungs. No pleural effusion or pneumothorax. Stable normal cardiac silhouette. IMPRESSION: No acute pulmonary process identified. Electronically Signed   By: Kristine Garbe M.D.   On: 02/12/2017 02:27      Scheduled Meds: . chlorhexidine  15 mL Mouth Rinse BID  . enoxaparin (LOVENOX) injection  40 mg Subcutaneous Q24H  . mouth rinse  15 mL Mouth Rinse q12n4p  . potassium chloride  40 mEq Oral Q4H  . valACYclovir  500  mg Oral Daily   Continuous Infusions: . sodium chloride    . cefTRIAXone (ROCEPHIN)  IV Stopped (02/13/17 1217)     LOS: 0 days    Time spent: 30 minutes   Dessa Phi, DO [facility] [email or web address] Password [email or web address] 02/13/2017, 1:44 PM

## 2017-02-14 DIAGNOSIS — N179 Acute kidney failure, unspecified: Secondary | ICD-10-CM | POA: Diagnosis not present

## 2017-02-14 LAB — CBC WITH DIFFERENTIAL/PLATELET
BASOS ABS: 0 10*3/uL (ref 0.0–0.1)
Basophils Relative: 0 %
EOS ABS: 0 10*3/uL (ref 0.0–0.7)
Eosinophils Relative: 0 %
HCT: 20.8 % — ABNORMAL LOW (ref 36.0–46.0)
Hemoglobin: 7.1 g/dL — ABNORMAL LOW (ref 12.0–15.0)
LYMPHS PCT: 7 %
Lymphs Abs: 0.6 10*3/uL — ABNORMAL LOW (ref 0.7–4.0)
MCH: 34.5 pg — AB (ref 26.0–34.0)
MCHC: 34.1 g/dL (ref 30.0–36.0)
MCV: 101 fL — ABNORMAL HIGH (ref 78.0–100.0)
MONOS PCT: 10 %
Monocytes Absolute: 0.8 10*3/uL (ref 0.1–1.0)
NEUTROS PCT: 83 %
Neutro Abs: 6.7 10*3/uL (ref 1.7–7.7)
PLATELETS: 117 10*3/uL — AB (ref 150–400)
RBC: 2.06 MIL/uL — AB (ref 3.87–5.11)
RDW: 15.2 % (ref 11.5–15.5)
WBC: 8.1 10*3/uL (ref 4.0–10.5)

## 2017-02-14 LAB — BASIC METABOLIC PANEL
Anion gap: 6 (ref 5–15)
BUN: 13 mg/dL (ref 6–20)
CO2: 23 mmol/L (ref 22–32)
Calcium: 6.6 mg/dL — ABNORMAL LOW (ref 8.9–10.3)
Chloride: 104 mmol/L (ref 101–111)
Creatinine, Ser: 0.59 mg/dL (ref 0.44–1.00)
Glucose, Bld: 128 mg/dL — ABNORMAL HIGH (ref 65–99)
POTASSIUM: 3.2 mmol/L — AB (ref 3.5–5.1)
SODIUM: 133 mmol/L — AB (ref 135–145)

## 2017-02-14 LAB — PREPARE RBC (CROSSMATCH)

## 2017-02-14 LAB — RETICULOCYTES
RBC.: 2.03 MIL/uL — AB (ref 3.87–5.11)
RETIC CT PCT: 5.7 % — AB (ref 0.4–3.1)
Retic Count, Absolute: 115.7 10*3/uL (ref 19.0–186.0)

## 2017-02-14 LAB — ABO/RH: ABO/RH(D): O POS

## 2017-02-14 LAB — SAVE SMEAR

## 2017-02-14 MED ORDER — HEPARIN SOD (PORK) LOCK FLUSH 100 UNIT/ML IV SOLN
500.0000 [IU] | Freq: Once | INTRAVENOUS | Status: AC
  Administered 2017-02-14: 500 [IU] via INTRAVENOUS

## 2017-02-14 MED ORDER — SODIUM CHLORIDE 0.9 % IV SOLN: Freq: Once | INTRAVENOUS | Status: DC

## 2017-02-14 MED ORDER — POTASSIUM CHLORIDE CRYS ER 20 MEQ PO TBCR
40.0000 meq | EXTENDED_RELEASE_TABLET | ORAL | Status: AC
  Administered 2017-02-14: 40 meq via ORAL
  Filled 2017-02-14: qty 2

## 2017-02-14 MED ORDER — CEFPODOXIME PROXETIL 100 MG PO TABS: 100.0000 mg | ORAL_TABLET | Freq: Two times a day (BID) | ORAL | 0 refills | Status: AC

## 2017-02-14 NOTE — Discharge Summary (Signed)
Physician Discharge Summary  Jasmine Hood RJJ:[id number] DOB: [birth year] DOA: 02/12/2017  PCP: Marletta Lor, MD  Admit date: 02/12/2017 Discharge date: 02/14/2017  Admitted From: Home Disposition:  Home  Recommendations for Outpatient Follow-up:  1. Follow up with PCP on Monday 10/1  2. Please obtain BMP/CBC on Monday 10/1  3. Please follow up on the following pending results: final urine culture sensitivity, iron, folate, B12, reticulocyte, smear  Home Health: PT OT RN  Equipment/Devices: 3in1   Discharge Condition: Stable, improved CODE STATUS: Full  Diet recommendation: Heart healthy   Brief/Interim Summary: Jasmine Hood a 71 y.o.femalewith medical history significant of metastatic breast cancer followed by Dr. [name]; who presents with complaints of weakness. Patient notes being significantly fatigued over the last 4 days with decreased appetite and poor oral intake. She reported that she was going out to her car, but was unable to get out of the car and sat there for over 5 hours. The family friend came over to check on her and noted that she seemed not like hernormal self and called EMS. Work up revealed AKI with Cr 1.66 from baseline of 0.7-0.8 as well as UTI. She was treated with empiric antibiotics for klebsiella UTI. AKI resolved. She also required 2u pRBC transfusion during hospitalization. Unfortunately does not meet inpatient criteria and will have to discharge home with close outpatient PCP follow up.   Discharge Diagnoses:  Principal Problem:   AKI (acute kidney injury) ([place]) Active Problems:   Malignant neoplasm of overlapping sites of right breast in female, estrogen receptor positive ([place])   Dehydration   Abnormal TSH  Acute kidney injury -Treated with IV fluids, resolved   Sepsis secondary to klebsiella UTI, present on admission -Urine culture with klebsiella, sensitivity pending. Follow up as outpatient. Discharge with vantin.   Generalized  weakness -Treat underlying infection and AKI. PT eval recommending HHPT   Hypokalemia -Replace, trend BMP. Repeat as outpatient.   Metastatic breast cancer -Followed by Dr. [name]   Macrocytic anemia -Baseline hemoglobin has been around 9 earlier this month, trended down to 7.1 yesterday and today. She denies any blood or dark stools, no vaginal bleeding or blood in urine, no vomiting blood. No nursing reports of bleeding. Could be secondary to chemotherapy? -Check iron, folate, B12, reticulocyte, smear -Transfuse 2 units packed red blood cells today to see if this will help with her weakness as well     Discharge Instructions  Discharge Instructions    Call MD for:  difficulty breathing, headache or visual disturbances    Complete by:  As directed    Call MD for:  extreme fatigue    Complete by:  As directed    Call MD for:  hives    Complete by:  As directed    Call MD for:  persistant dizziness or light-headedness    Complete by:  As directed    Call MD for:  persistant nausea and vomiting    Complete by:  As directed    Call MD for:  severe uncontrolled pain    Complete by:  As directed    Call MD for:  temperature >100.4    Complete by:  As directed    Diet - low sodium heart healthy    Complete by:  As directed    Discharge instructions    Complete by:  As directed    You were cared for by a hospitalist during your hospital stay. If you have any questions about  your discharge medications or the care you received while you were in the hospital after you are discharged, you can call the unit and asked to speak with the hospitalist on call if the hospitalist that took care of you is not available. Once you are discharged, your primary care physician will handle any further medical issues. Please note that NO REFILLS for any discharge medications will be authorized once you are discharged, as it is imperative that you return to your primary care physician (or establish a  relationship with a primary care physician if you do not have one) for your aftercare needs so that they can reassess your need for medications and monitor your lab values.   Increase activity slowly    Complete by:  As directed      Allergies as of 02/14/2017      Reactions   Bee Venom Anaphylaxis      Medication List    TAKE these medications   cefpodoxime 100 MG tablet Commonly known as:  VANTIN Take 1 tablet (100 mg total) by mouth 2 (two) times daily.   dexamethasone 4 MG tablet Commonly known as:  DECADRON Take 2 tablets (8 mg total) by mouth daily. Start the day after chemotherapy for 2 days. Take with food.   EPINEPHrine 0.3 mg/0.3 mL Soaj injection Commonly known as:  EPIPEN 2-PAK Inject 0.3 mLs (0.3 mg total) into the muscle once. What changed:  when to take this  reasons to take this  additional instructions   fluconazole 100 MG tablet Commonly known as:  DIFLUCAN Take 2 tablets (200 mg total) by mouth daily. Take for 10 days following treatment, every 21 days   lidocaine-prilocaine cream Commonly known as:  EMLA Apply 1 application topically as needed. What changed:  when to take this  additional instructions   magic mouthwash w/lidocaine Soln Take 5 mLs by mouth 4 (four) times daily as needed for mouth pain.   promethazine 25 MG suppository Commonly known as:  PHENERGAN Place 1 suppository (25 mg total) rectally every 6 (six) hours as needed for nausea or vomiting.   triamcinolone ointment 0.5 % Commonly known as:  KENALOG Apply 1 application topically 2 (two) times daily.   Adonis Housekeeper EX Apply 1 application topically 3 (three) times daily as needed. Redness   valACYclovir 500 MG tablet Commonly known as:  VALTREX Take 500 mg by mouth daily.            Durable Medical Equipment        Start     Ordered   02/12/17 1350  For home use only DME 3 n 1  Once     02/12/17 1349       Discharge Care Instructions        Start      Ordered   02/14/17 0000  Increase activity slowly     02/14/17 1408   02/14/17 0000  Diet - low sodium heart healthy     02/14/17 1408   02/14/17 0000  Discharge instructions    Comments:  You were cared for by a hospitalist during your hospital stay. If you have any questions about your discharge medications or the care you received while you were in the hospital after you are discharged, you can call the unit and asked to speak with the hospitalist on call if the hospitalist that took care of you is not available. Once you are discharged, your primary care physician will handle any further medical issues. Please  note that NO REFILLS for any discharge medications will be authorized once you are discharged, as it is imperative that you return to your primary care physician (or establish a relationship with a primary care physician if you do not have one) for your aftercare needs so that they can reassess your need for medications and monitor your lab values.   02/14/17 1408   02/14/17 0000  Call MD for:  extreme fatigue     02/14/17 1408   02/14/17 0000  Call MD for:  persistant dizziness or light-headedness     02/14/17 1408   02/14/17 0000  Call MD for:  hives     02/14/17 1408   02/14/17 0000  Call MD for:  difficulty breathing, headache or visual disturbances     02/14/17 1408   02/14/17 0000  Call MD for:  severe uncontrolled pain     02/14/17 1408   02/14/17 0000  Call MD for:  persistant nausea and vomiting     02/14/17 1408   02/14/17 0000  Call MD for:  temperature >100.4     02/14/17 1408   02/14/17 0000  cefpodoxime (VANTIN) 100 MG tablet  2 times daily     02/14/17 1417     Follow-up Information    Marletta Lor, MD. Schedule an appointment as soon as possible for a visit in 1 week(s).   Specialty:  Internal Medicine Why:  Follow up for final urine culture results, repeat CBC and BMP  Contact information: [place]  [phone]          Allergies  Allergen Reactions  . Bee Venom Anaphylaxis    Consultations:  None   Procedures/Studies: Dg Chest 2 View  Result Date: 02/12/2017 CLINICAL DATA:  71 y/o F; week with altered mental status. Metastatic breast cancer. EXAM: CHEST  2 VIEW COMPARISON:  10/29/2016 chest radiograph FINDINGS: Left port catheter tip projects over lower SVC. Mild S-shaped curvature of the spine. No acute osseous abnormality is evident. Clear lungs. No pleural effusion or pneumothorax. Stable normal cardiac silhouette. IMPRESSION: No acute pulmonary process identified. Electronically Signed   By: Kristine Garbe M.D.   On: 02/12/2017 02:27       Discharge Exam: Vitals:   02/14/17 1339 02/14/17 1359  BP: (!) 150/75 (!) 147/75  Pulse: 90 96  Resp:  18  Temp: 98.2 F (36.8 C) 98.4 F (36.9 C)  SpO2: 99% 99%   Vitals:   02/13/17 2148 02/14/17 0537 02/14/17 1339 02/14/17 1359  BP: (!) 141/76 (!) 138/55 (!) 150/75 (!) 147/75  Pulse: (!) 102 92 90 96  Resp: 20 18  18   Temp: 97.9 F (36.6 C) 98.2 F (36.8 C) 98.2 F (36.8 C) 98.4 F (36.9 C)  TempSrc: Oral Oral Oral Oral  SpO2: 98% 97% 99% 99%  Weight:      Height:         General exam: Appears calm and comfortable  Respiratory system: Clear to auscultation. Respiratory effort normal. Cardiovascular system: S1 & S2 heard, RRR. No JVD, murmurs, rubs, gallops or clicks. No pedal edema. Gastrointestinal system: Abdomen is nondistended, soft and nontender. No organomegaly or masses felt. Normal bowel sounds heard. Central nervous system: Alert and oriented. No focal neurological deficits. Extremities: Symmetric 5 x 5 power. Skin: No rashes, lesions or ulcers Psychiatry: Judgement and insight appear normal. Mood & affect appropriate.      The results of significant diagnostics from this hospitalization (including imaging,  microbiology, ancillary and laboratory) are listed below for reference.      Microbiology: Recent Results (from the past 240 hour(s))  Culture, Urine     Status: Abnormal (Preliminary result)   Collection Time: 02/12/17  6:00 AM  Result Value Ref Range Status   Specimen Description URINE, RANDOM  Final   Special Requests Immunocompromised  Final   Culture >=100,000 COLONIES/mL KLEBSIELLA PNEUMONIAE (A)  Final   Report Status PENDING  Incomplete     Labs: BNP (last 3 results) No results for input(s): BNP in the last 8760 hours. Basic Metabolic Panel:  Recent Labs Lab 02/12/17 0310 02/13/17 0600 02/14/17 0500  NA 136 137 133*  K 3.9 3.0* 3.2*  CL 100* 105 104  CO2 20* 23 23  GLUCOSE 191* 142* 128*  BUN 39* 19 13  CREATININE 1.66* 0.67 0.59  CALCIUM 7.7* 6.6* 6.6*   Liver Function Tests:  Recent Labs Lab 02/12/17 0310  AST 87*  ALT 47  ALKPHOS 105  BILITOT 1.1  PROT 6.3*  ALBUMIN 2.3*    Recent Labs Lab 02/12/17 0310  LIPASE 24   No results for input(s): AMMONIA in the last 168 hours. CBC:  Recent Labs Lab 02/12/17 0310 02/13/17 0600 02/14/17 0500  WBC 9.0 7.7 8.1  NEUTROABS 6.9  --  6.7  HGB 9.1* 7.1* 7.1*  HCT 26.6* 21.0* 20.8*  MCV 97.8 100.0 101.0*  PLT 130* 112* 117*   Cardiac Enzymes: No results for input(s): CKTOTAL, CKMB, CKMBINDEX, TROPONINI in the last 168 hours. BNP: Invalid input(s): POCBNP CBG: No results for input(s): GLUCAP in the last 168 hours. D-Dimer No results for input(s): DDIMER in the last 72 hours. Hgb A1c No results for input(s): HGBA1C in the last 72 hours. Lipid Profile No results for input(s): CHOL, HDL, LDLCALC, TRIG, CHOLHDL, LDLDIRECT in the last 72 hours. Thyroid function studies  Recent Labs  02/12/17 1500  TSH 2.129   Anemia work up  Recent Labs  02/14/17 0500  RETICCTPCT 5.7*   Urinalysis    Component Value Date/Time   COLORURINE YELLOW (A) 02/12/2017 0600   APPEARANCEUR TURBID (A) 02/12/2017 0600   LABSPEC 1.008 02/12/2017 0600   PHURINE 5.0 02/12/2017 0600    GLUCOSEU NEGATIVE 02/12/2017 0600   GLUCOSEU NEGATIVE 01/25/2010 0858   HGBUR MODERATE (A) 02/12/2017 0600   HGBUR 1+ 12/30/2008 0838   BILIRUBINUR NEGATIVE 02/12/2017 0600   BILIRUBINUR Neg 04/28/2015 1122   KETONESUR NEGATIVE 02/12/2017 0600   PROTEINUR 100 (A) 02/12/2017 0600   UROBILINOGEN 0.2 04/28/2015 1122   UROBILINOGEN 0.2 01/25/2010 0858   NITRITE NEGATIVE 02/12/2017 0600   LEUKOCYTESUR MODERATE (A) 02/12/2017 0600   Sepsis Labs Invalid input(s): PROCALCITONIN,  WBC,  LACTICIDVEN Microbiology Recent Results (from the past 240 hour(s))  Culture, Urine     Status: Abnormal (Preliminary result)   Collection Time: 02/12/17  6:00 AM  Result Value Ref Range Status   Specimen Description URINE, RANDOM  Final   Special Requests Immunocompromised  Final   Culture >=100,000 COLONIES/mL KLEBSIELLA PNEUMONIAE (A)  Final   Report Status PENDING  Incomplete     Time coordinating discharge: 50 minutes  SIGNED:  Dessa Phi, DO [facility] Pager [phone]  If 7PM-7AM, please contact night-coverage [email or web address] Password [email or web address] 02/14/2017, 2:19 PM

## 2017-02-14 NOTE — Progress Notes (Signed)
Physical Therapy Treatment Patient Details Name: Jasmine Hood MRN: [id number] DOB: November 10, 1945 Today's Date: 02/14/2017    History of Present Illness Pt admitted through ED with weakness and is currently undergoing tx for Breast CA; dx -UTI    PT Comments    Pt agreeable to OOB to chair with much encouragement, declines walking/further activity today; will continue to follow during acute stay   Follow Up Recommendations  Home health PT;Supervision for mobility/OOB     Equipment Recommendations  3in1 (PT)    Recommendations for Other Services       Precautions / Restrictions Precautions Precautions: Fall Restrictions Weight Bearing Restrictions: No    Mobility  Bed Mobility Overal bed mobility: Needs Assistance Bed Mobility: Rolling;Sidelying to Sit Rolling: Min assist;Mod assist Sidelying to sit: Mod assist       General bed mobility comments:  multi-modal cues for sequence with physical assist to bring trunk to upright and complete transition to EOB sitting  Transfers Overall transfer level: Needs assistance Equipment used: Rolling walker (2 wheeled)   Sit to Stand: Min assist         General transfer comment: cues for safety and hand placement;   Ambulation/Gait Ambulation/Gait assistance: Min assist Ambulation Distance (Feet): 6 Feet Assistive device: Rolling walker (2 wheeled) Gait Pattern/deviations: Step-through pattern;Decreased step length - right;Decreased step length - left;Shuffle;Trunk flexed     General Gait Details: cues for posture and position from RW.  Physical assist for balance/stability and RW management; pt declined to amb more than a few feet   Stairs            Wheelchair Mobility    Modified Rankin (Stroke Patients Only)       Balance   Sitting-balance support: No upper extremity supported;Feet supported Sitting balance-Leahy Scale: Good     Standing balance support: Bilateral upper extremity supported Standing  balance-Leahy Scale: Poor (reliant on UEs)                              Cognition Arousal/Alertness: Awake/alert Behavior During Therapy: Flat affect Overall Cognitive Status: No family/caregiver present to determine baseline cognitive functioning                                        Exercises      General Comments General comments (skin integrity, edema, etc.): extremities appear edematous, UEs > LEs      Pertinent Vitals/Pain Pain Assessment: No/denies pain    Home Living                      Prior Function            PT Goals (current goals can now be found in the care plan section) Acute Rehab PT Goals Patient Stated Goal: HOME PT Goal Formulation: With patient Time For Goal Achievement: 02/26/17 Potential to Achieve Goals: Fair Progress towards PT goals: Progressing toward goals    Frequency    Min 3X/week      PT Plan Current plan remains appropriate    Co-evaluation              AM-PAC PT "6 Clicks" Daily Activity  Outcome Measure  Difficulty turning over in bed (including adjusting bedclothes, sheets and blankets)?: Unable Difficulty moving from lying on back to sitting on the side of the  bed? : Unable Difficulty sitting down on and standing up from a chair with arms (e.g., wheelchair, bedside commode, etc,.)?: Unable Help needed moving to and from a bed to chair (including a wheelchair)?: A Little Help needed walking in hospital room?: A Little Help needed climbing 3-5 steps with a railing? : A Lot 6 Click Score: 11    End of Session Equipment Utilized During Treatment: Gait belt Activity Tolerance: Patient tolerated treatment well;Patient limited by fatigue Patient left: in chair;with call bell/phone within reach;with chair alarm set Nurse Communication: Mobility status PT Visit Diagnosis: Unsteadiness on feet (R26.81);Muscle weakness (generalized) (M62.81);Difficulty in walking, not elsewhere  classified (R26.2)     Time: 1252-7129 PT Time Calculation (min) (ACUTE ONLY): 15 min  Charges:  $Therapeutic Activity: 8-22 mins                    G CodesKenyon Ana, PT Pager: [phone] 02/14/2017    [facility] 02/14/2017, 10:56 AM

## 2017-02-14 NOTE — Progress Notes (Signed)
PROGRESS NOTE    Jasmine Hood  OZD:[id number] DOB: 05/30/1945 DOA: 02/12/2017 PCP: Marletta Lor, MD     Brief Narrative:  Jasmine Hood a 71 y.o.femalewith medical history significant of metastatic breast cancer followed by Dr. [name]; who presents with complaints of weakness. Patient notes being significantly fatigued over the last 4 days with decreased appetite and poor oral intake. She reported that she was going out to her car, but was unable to get out of the car and sat there for over 5 hours. The family friend came over to check on her and noted that she seemed not like hernormal self and called EMS. Work up revealed AKI with Cr 1.66 from baseline of 0.7-0.8 as well as UTI.   Assessment & Plan:   Principal Problem:   AKI (acute kidney injury) ([place]) Active Problems:   Malignant neoplasm of overlapping sites of right breast in female, estrogen receptor positive ([place])   Dehydration   Abnormal TSH   Acute kidney injury -Treated with IV fluids, resolved   Sepsis secondary to klebsiella UTI, present on admission -Continue empiric Rocephin, urine culture with klebsiella, sensitivity pending   Generalized weakness -Treat underlying infection and AKI. PT eval recommending HHPT   Hypokalemia -Replace, trend   Metastatic breast cancer -Followed by Dr. [name]   Macrocytic anemia -Baseline hemoglobin has been around 9 earlier this month, trended down to 7.1 yesterday and today. She denies any blood or dark stools, no vaginal bleeding or blood in urine, no vomiting blood. No nursing reports of bleeding. Could be secondary to chemotherapy? -Check iron, folate, B12, reticulocyte, smear -Transfuse 2 units packed red blood cells today to see if this will help with her weakness as well    DVT prophylaxis: lovenox Code Status: full Family Communication: no family at bedside Disposition Plan: pending improvement, urine culture result   Consultants:    None  Procedures:   None   Antimicrobials:  Anti-infectives    Start     Dose/Rate Route Frequency Ordered Stop   02/12/17 1000  valACYclovir (VALTREX) tablet 500 mg     500 mg Oral Daily 02/12/17 0612     02/12/17 1000  cefTRIAXone (ROCEPHIN) 1 g in dextrose 5 % 50 mL IVPB     1 g 100 mL/hr over 30 Minutes Intravenous Every 24 hours 02/12/17 0823         Subjective: Feeling better today, still weak and tired. She denies any blood or dark stools, no vaginal bleeding or blood in urine, no vomiting blood. No nursing reports of bleeding. Denies any dizziness or lightheadedness. No chest pain or shortness of breath, no nausea, vomiting, diarrhea or abdominal pain.  Objective: Vitals:   02/13/17 0426 02/13/17 1611 02/13/17 2148 02/14/17 0537  BP: (!) 143/76 (!) 149/80 (!) 141/76 (!) 138/55  Pulse: (!) 104 (!) 109 (!) 102 92  Resp: 20 18 20 18   Temp: 98.4 F (36.9 C) 98.2 F (36.8 C) 97.9 F (36.6 C) 98.2 F (36.8 C)  TempSrc: Oral Oral Oral Oral  SpO2: 96% 99% 98% 97%  Weight:      Height:        Intake/Output Summary (Last 24 hours) at 02/14/17 1333 Last data filed at 02/14/17 1042  Gross per 24 hour  Intake               60 ml  Output                0 ml  Net               60 ml   Filed Weights   02/12/17 0855  Weight: 101.8 kg (224 lb 6.9 oz)    Examination:  General exam: Appears calm and comfortable  Respiratory system: Clear to auscultation. Respiratory effort normal. Cardiovascular system: S1 & S2 heard, RRR. No JVD, murmurs, rubs, gallops or clicks. No pedal edema. Gastrointestinal system: Abdomen is nondistended, soft and nontender. No organomegaly or masses felt. Normal bowel sounds heard. Central nervous system: Alert and oriented. No focal neurological deficits. Extremities: Symmetric 5 x 5 power. Skin: No rashes, lesions or ulcers Psychiatry: Judgement and insight appear normal. Mood & affect appropriate.   Data Reviewed: I have personally  reviewed following labs and imaging studies  CBC:  Recent Labs Lab 02/12/17 0310 02/13/17 0600 02/14/17 0500  WBC 9.0 7.7 8.1  NEUTROABS 6.9  --  6.7  HGB 9.1* 7.1* 7.1*  HCT 26.6* 21.0* 20.8*  MCV 97.8 100.0 101.0*  PLT 130* 112* 938*   Basic Metabolic Panel:  Recent Labs Lab 02/12/17 0310 02/13/17 0600 02/14/17 0500  NA 136 137 133*  K 3.9 3.0* 3.2*  CL 100* 105 104  CO2 20* 23 23  GLUCOSE 191* 142* 128*  BUN 39* 19 13  CREATININE 1.66* 0.67 0.59  CALCIUM 7.7* 6.6* 6.6*   GFR: Estimated Creatinine Clearance: 65.8 mL/min (by C-G formula based on SCr of 0.59 mg/dL). Liver Function Tests:  Recent Labs Lab 02/12/17 0310  AST 87*  ALT 47  ALKPHOS 105  BILITOT 1.1  PROT 6.3*  ALBUMIN 2.3*    Recent Labs Lab 02/12/17 0310  LIPASE 24   No results for input(s): AMMONIA in the last 168 hours. Coagulation Profile: No results for input(s): INR, PROTIME in the last 168 hours. Cardiac Enzymes: No results for input(s): CKTOTAL, CKMB, CKMBINDEX, TROPONINI in the last 168 hours. BNP (last 3 results) No results for input(s): PROBNP in the last 8760 hours. HbA1C: No results for input(s): HGBA1C in the last 72 hours. CBG: No results for input(s): GLUCAP in the last 168 hours. Lipid Profile: No results for input(s): CHOL, HDL, LDLCALC, TRIG, CHOLHDL, LDLDIRECT in the last 72 hours. Thyroid Function Tests:  Recent Labs  02/12/17 1500  TSH 2.129  FREET4 0.95   Anemia Panel: No results for input(s): VITAMINB12, FOLATE, FERRITIN, TIBC, IRON, RETICCTPCT in the last 72 hours. Sepsis Labs: No results for input(s): PROCALCITON, LATICACIDVEN in the last 168 hours.  Recent Results (from the past 240 hour(s))  Culture, Urine     Status: Abnormal (Preliminary result)   Collection Time: 02/12/17  6:00 AM  Result Value Ref Range Status   Specimen Description URINE, RANDOM  Final   Special Requests Immunocompromised  Final   Culture >=100,000 COLONIES/mL KLEBSIELLA  PNEUMONIAE (A)  Final   Report Status PENDING  Incomplete       Radiology Studies: No results found.    Scheduled Meds: . chlorhexidine  15 mL Mouth Rinse BID  . enoxaparin (LOVENOX) injection  40 mg Subcutaneous Q24H  . mouth rinse  15 mL Mouth Rinse q12n4p  . potassium chloride  40 mEq Oral Q4H  . valACYclovir  500 mg Oral Daily   Continuous Infusions: . sodium chloride    . cefTRIAXone (ROCEPHIN)  IV Stopped (02/14/17 1105)     LOS: 0 days    Time spent: 30 minutes   Dessa Phi, DO [facility] [email or web address] Password [email or web address] 02/14/2017, 1:33 PM

## 2017-02-14 NOTE — Progress Notes (Signed)
Patient was discharged at 2335. She left in company of her daughter and friends.

## 2017-02-15 LAB — URINE CULTURE: Culture: >=100000 — AB

## 2017-02-15 LAB — TYPE AND SCREEN
ABO/RH(D): O POS
ANTIBODY SCREEN: NEGATIVE
UNIT DIVISION: 0
Unit division: 0

## 2017-02-15 LAB — IRON AND TIBC
IRON: 144 ug/dL (ref 28–170)
Saturation Ratios: 75 % — ABNORMAL HIGH (ref 10.4–31.8)
TIBC: 193 ug/dL — ABNORMAL LOW (ref 250–450)
UIBC: 49 ug/dL

## 2017-02-15 LAB — BPAM RBC
Blood Product Expiration Date: 201810182359
Blood Product Expiration Date: 201810182359
ISSUE DATE / TIME: 201809281325
ISSUE DATE / TIME: 201809281651
UNIT TYPE AND RH: 5100
Unit Type and Rh: 5100

## 2017-02-15 LAB — VITAMIN B12: Vitamin B-12: 3800 pg/mL — ABNORMAL HIGH (ref 180–914)

## 2017-02-15 LAB — FERRITIN: FERRITIN: 1283 ng/mL — AB (ref 11–307)

## 2017-02-17 ENCOUNTER — Telehealth: Payer: Self-pay | Admitting: *Deleted

## 2017-02-17 LAB — FOLATE RBC
FOLATE, HEMOLYSATE: 238.9 ng/mL
Folate, RBC: 735 ng/mL (ref >498)
Hematocrit: 32.5 % — ABNORMAL LOW (ref 34.0–46.6)

## 2017-02-17 NOTE — Telephone Encounter (Signed)
Transition Care Management Follow-up Telephone Call  Per Discharge Summary: Admit date: 02/12/2017 Discharge date: 02/14/2017  Admitted From: Home Disposition:  Home  Recommendations for Outpatient Follow-up:  1. Follow up with PCP on Monday 10/1  2. Please obtain BMP/CBC on Monday 10/1  3. Please follow up on the following pending results: final urine culture sensitivity, iron, folate, B12, reticulocyte, smear  Home Health: PT OT RN  Equipment/Devices: 3in1   Discharge Condition: Stable, improved CODE STATUS: Full  Diet recommendation: Heart healthy   --   How have you been since you were released from the hospital? "Alright, I guess."   Do you understand why you were in the hospital? yes   Do you understand the discharge instructions? yes   Where were you discharged to? Home   Items Reviewed:  Medications reviewed: yes  Allergies reviewed: yes  Dietary changes reviewed: yes  Referrals reviewed: no   Functional Questionnaire:   Activities of Daily Living (ADLs):   She states they are independent in the following: feeding, continence and grooming States they require assistance with the following: ambulation, bathing and hygiene, toileting and dressing. Daughter helping w/ ADLs.   Any transportation issues/concerns?: no, friend drives her to appts   Any patient concerns? no   Confirmed importance and date/time of follow-up visits scheduled yes  Provider Appointment booked with [name] 02/19/17 @ 10am. Patient unable to come in for follow-up today due to transportation issues, but she has appt w/ oncology tomorrow.  Confirmed with patient if condition begins to worsen call PCP or go to the ER.  Patient was given the office number and encouraged to call back with question or concerns.  : yes

## 2017-02-18 ENCOUNTER — Ambulatory Visit (HOSPITAL_BASED_OUTPATIENT_CLINIC_OR_DEPARTMENT_OTHER): Payer: Medicare Other

## 2017-02-18 ENCOUNTER — Ambulatory Visit (HOSPITAL_BASED_OUTPATIENT_CLINIC_OR_DEPARTMENT_OTHER): Payer: Medicare Other | Admitting: Hematology and Oncology

## 2017-02-18 ENCOUNTER — Other Ambulatory Visit (HOSPITAL_BASED_OUTPATIENT_CLINIC_OR_DEPARTMENT_OTHER): Payer: Medicare Other

## 2017-02-18 ENCOUNTER — Encounter: Payer: Self-pay | Admitting: *Deleted

## 2017-02-18 ENCOUNTER — Telehealth: Payer: Self-pay | Admitting: Hematology and Oncology

## 2017-02-18 VITALS — BP 156/63 | HR 93 | Resp 16

## 2017-02-18 DIAGNOSIS — Z95828 Presence of other vascular implants and grafts: Secondary | ICD-10-CM

## 2017-02-18 DIAGNOSIS — Z170 Estrogen receptor positive status [ER+]: Principal | ICD-10-CM

## 2017-02-18 DIAGNOSIS — Z6841 Body Mass Index (BMI) 40.0 and over, adult: Secondary | ICD-10-CM

## 2017-02-18 DIAGNOSIS — K7469 Other cirrhosis of liver: Secondary | ICD-10-CM

## 2017-02-18 DIAGNOSIS — C50811 Malignant neoplasm of overlapping sites of right female breast: Secondary | ICD-10-CM | POA: Diagnosis not present

## 2017-02-18 DIAGNOSIS — Z452 Encounter for adjustment and management of vascular access device: Secondary | ICD-10-CM

## 2017-02-18 DIAGNOSIS — Z5111 Encounter for antineoplastic chemotherapy: Secondary | ICD-10-CM

## 2017-02-18 DIAGNOSIS — C7951 Secondary malignant neoplasm of bone: Secondary | ICD-10-CM | POA: Diagnosis not present

## 2017-02-18 LAB — COMPREHENSIVE METABOLIC PANEL
ALT: 36 U/L (ref 0–55)
ANION GAP: 10 meq/L (ref 3–11)
AST: 43 U/L — AB (ref 5–34)
Albumin: 2 g/dL — ABNORMAL LOW (ref 3.5–5.0)
Alkaline Phosphatase: 169 U/L — ABNORMAL HIGH (ref 40–150)
BUN: 12.2 mg/dL (ref 7.0–26.0)
CHLORIDE: 103 meq/L (ref 98–109)
CO2: 24 meq/L (ref 22–29)
CREATININE: 0.7 mg/dL (ref 0.6–1.1)
Calcium: 9.6 mg/dL (ref 8.4–10.4)
EGFR: 87 mL/min/{1.73_m2} — ABNORMAL LOW (ref >90)
Glucose: 127 mg/dl (ref 70–140)
POTASSIUM: 3.9 meq/L (ref 3.5–5.1)
Sodium: 137 mEq/L (ref 136–145)
Total Bilirubin: 0.56 mg/dL (ref 0.20–1.20)
Total Protein: 6.6 g/dL (ref 6.4–8.3)

## 2017-02-18 LAB — CBC WITH DIFFERENTIAL/PLATELET
BASO%: 0.6 % (ref 0.0–2.0)
Basophils Absolute: 0 10*3/uL (ref 0.0–0.1)
EOS%: 0.3 % (ref 0.0–7.0)
Eosinophils Absolute: 0 10*3/uL (ref 0.0–0.5)
HCT: 35.7 % (ref 34.8–46.6)
HGB: 11.9 g/dL (ref 11.6–15.9)
LYMPH#: 0.6 10*3/uL — AB (ref 0.9–3.3)
LYMPH%: 9 % — ABNORMAL LOW (ref 14.0–49.7)
MCH: 32.6 pg (ref 25.1–34.0)
MCHC: 33.2 g/dL (ref 31.5–36.0)
MCV: 98.1 fL (ref 79.5–101.0)
MONO#: 0.7 10*3/uL (ref 0.1–0.9)
MONO%: 10.3 % (ref 0.0–14.0)
NEUT#: 5.4 10*3/uL (ref 1.5–6.5)
NEUT%: 79.8 % — AB (ref 38.4–76.8)
Platelets: 149 10*3/uL (ref 145–400)
RBC: 3.64 10*6/uL — AB (ref 3.70–5.45)
RDW: 18.1 % — AB (ref 11.2–14.5)
WBC: 6.7 10*3/uL (ref 3.9–10.3)

## 2017-02-18 MED ORDER — SODIUM CHLORIDE 0.9% FLUSH
10.0000 mL | INTRAVENOUS | Status: DC | PRN
  Filled 2017-02-18: qty 10

## 2017-02-18 MED ORDER — METHOTREXATE SODIUM (PF) CHEMO INJECTION 250 MG/10ML
39.0000 mg/m2 | Freq: Once | INTRAMUSCULAR | Status: AC
  Administered 2017-02-18: 85 mg via INTRAVENOUS
  Filled 2017-02-18: qty 3.4

## 2017-02-18 MED ORDER — DEXAMETHASONE SODIUM PHOSPHATE 10 MG/ML IJ SOLN
INTRAMUSCULAR | Status: AC
  Filled 2017-02-18: qty 1

## 2017-02-18 MED ORDER — SODIUM CHLORIDE 0.9% FLUSH
10.0000 mL | Freq: Once | INTRAVENOUS | Status: AC
  Administered 2017-02-18: 10 mL
  Filled 2017-02-18: qty 10

## 2017-02-18 MED ORDER — FLUOROURACIL CHEMO INJECTION 2.5 GM/50ML
600.0000 mg/m2 | Freq: Once | INTRAVENOUS | Status: AC
Start: 2017-02-18 — End: 2017-02-18
  Administered 2017-02-18: 1300 mg via INTRAVENOUS
  Filled 2017-02-18: qty 26

## 2017-02-18 MED ORDER — PALONOSETRON HCL INJECTION 0.25 MG/5ML
INTRAVENOUS | Status: AC
  Filled 2017-02-18: qty 5

## 2017-02-18 MED ORDER — PALONOSETRON HCL INJECTION 0.25 MG/5ML
0.2500 mg | Freq: Once | INTRAVENOUS | Status: AC
  Administered 2017-02-18: 0.25 mg via INTRAVENOUS

## 2017-02-18 MED ORDER — DEXAMETHASONE SODIUM PHOSPHATE 10 MG/ML IJ SOLN
10.0000 mg | Freq: Once | INTRAMUSCULAR | Status: AC
  Administered 2017-02-18: 10 mg via INTRAVENOUS

## 2017-02-18 MED ORDER — HEPARIN SOD (PORK) LOCK FLUSH 100 UNIT/ML IV SOLN
500.0000 [IU] | Freq: Once | INTRAVENOUS | Status: DC | PRN
  Filled 2017-02-18: qty 5

## 2017-02-18 MED ORDER — SODIUM CHLORIDE 0.9 % IV SOLN
Freq: Once | INTRAVENOUS | Status: AC
  Administered 2017-02-18: 13:00:00 via INTRAVENOUS

## 2017-02-18 MED ORDER — SODIUM CHLORIDE 0.9 % IV SOLN
600.0000 mg/m2 | Freq: Once | INTRAVENOUS | Status: AC
  Administered 2017-02-18: 1300 mg via INTRAVENOUS
  Filled 2017-02-18: qty 65

## 2017-02-18 NOTE — Patient Instructions (Signed)
[facility] Discharge Instructions for Patients Receiving Chemotherapy  Today you received the following chemotherapy agents Cytoxan, Methotrexate and Fluorouracil  To help prevent nausea and vomiting after your treatment, we encourage you to take your nausea medication as directed.  If you develop nausea and vomiting that is not controlled by your nausea medication, call the clinic.   BELOW ARE SYMPTOMS THAT SHOULD BE REPORTED IMMEDIATELY:  *FEVER GREATER THAN 100.5 F  *CHILLS WITH OR WITHOUT FEVER  NAUSEA AND VOMITING THAT IS NOT CONTROLLED WITH YOUR NAUSEA MEDICATION  *UNUSUAL SHORTNESS OF BREATH  *UNUSUAL BRUISING OR BLEEDING  TENDERNESS IN MOUTH AND THROAT WITH OR WITHOUT PRESENCE OF ULCERS  *URINARY PROBLEMS  *BOWEL PROBLEMS  UNUSUAL RASH Items with * indicate a potential emergency and should be followed up as soon as possible.  Feel free to call the clinic should you have any questions or concerns. The clinic phone number is [phone].  Please show the [facility] at check-in to the Emergency Department and triage nurse.

## 2017-02-18 NOTE — Progress Notes (Signed)
[place] Work  Holiday representative received referral from [employer].  [place] contacted patient at home in attempt to offer support and assess for needs.  Patients voicemail was full, therefore  CSW unable to leave a message.  CSw will continue attempting to contact patient.  Johnnye Lana, MSW, LCSW, OSW-C Clinical Social Worker [facility] [phone]

## 2017-02-18 NOTE — Progress Notes (Signed)
Patient Care Team: Marletta Lor, MD as PCP - General Magrinat, Virgie Dad, MD as Consulting Physician (Oncology) Alphonsa Overall, MD as Consulting Physician (General Surgery) Kyung Rudd, MD as Consulting Physician (Radiation Oncology) Irene Limbo, MD as Consulting Physician (Plastic Surgery) Rockwell Germany, RN as Registered Nurse  DIAGNOSIS:  Encounter Diagnosis  Name Primary?  . Malignant neoplasm of overlapping sites of right breast in female, estrogen receptor positive ([place])     SUMMARY OF ONCOLOGIC HISTORY:   Malignant neoplasm of overlapping sites of right breast in female, estrogen receptor positive ([place])   10/21/2016 Initial Diagnosis    Malignant neoplasm of overlapping sites of right breast in female, estrogen receptor positive ([place])     11/19/2016 Genetic Testing    Genetic counseling and testing for hereditary cancer syndromes performed on 11/19/2016. Results are negative for pathogenic mutations in 46 genes analyzed by [facility]'s Common Hereditary Cancers Panel. Results are dated 11/26/2016. Genes tested: APC, ATM, AXIN2, BARD1, BMPR1A, BRCA1, BRCA2, BRIP1, CDH1, CDKN2A, CHEK2, CTNNA1, DICER1, EPCAM, GREM1, HOXB13, KIT, MEN1, MLH1, MSH2, MSH3, MSH6, MUTYH, NBN, NF1, NTHL1, PALB2, PDGFRA, PMS2, POLD1, POLE, PTEN, RAD50, RAD51C, RAD51D, SDHA, SDHB, SDHC, SDHD, SMAD4, SMARCA4, STK11, TP53, TSC1, TSC2, and VHL.  A variant of uncertain significance (not clinically actionable) was noted in CTNNA1.          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 She is currently on neoadjuvant chemotherapy with CMF. Today is cycle 6 of treatment. She will be undergoing further evaluation with a breast MRI and a PET/CT scan. The plan is for her to undergo surgery followed by radiation. She was recently in the hospital with confusion  and urinary tract infection. These symptoms had improved and she appears to be doing much better. She is in a wheelchair today.  REVIEW OF SYSTEMS:   Constitutional: Denies fevers, chills or abnormal weight loss Eyes: Denies blurriness of vision Ears, nose, mouth, throat, and face: Denies mucositis or sore throat Respiratory: Denies cough, dyspnea or wheezes Cardiovascular: Denies palpitation, chest discomfort Gastrointestinal:  Denies nausea, heartburn or change in bowel habits Skin: Denies abnormal skin rashes Lymphatics: Denies new lymphadenopathy or easy bruising Neurological: Acute confusion has resolved. Behavioral/Psych: Mood is stable, no new changes  Extremities: 2+ lower extremity edema  All other systems were reviewed with the patient and are negative.  I have reviewed the past medical history, past surgical history, social history and family history with the patient and they are unchanged from previous note.  ALLERGIES:  is allergic to bee venom.  MEDICATIONS:  Current Outpatient Prescriptions  Medication Sig Dispense Refill  . cefpodoxime (VANTIN) 100 MG tablet Take 1 tablet (100 mg total) by mouth 2 (two) times daily. 8 tablet 0  . dexamethasone (DECADRON) 4 MG tablet Take 2 tablets (8 mg total) by mouth daily. Start the day after chemotherapy for 2 days. Take with food. (Patient not taking: Reported on 02/12/2017) 30 tablet 1  . EPINEPHrine (EPIPEN 2-PAK) 0.3 mg/0.3 mL SOAJ injection Inject 0.3 mLs (0.3 mg total) into the muscle once. (Patient taking differently: Inject 0.3 mg into the muscle daily as needed. Allergic Reaction) 1 Device 2  . fluconazole (DIFLUCAN) 100 MG tablet Take 2 tablets (200 mg total) by mouth daily. Take for 10 days following treatment, every 21 days (Patient not taking: Reported on 02/12/2017)  70 tablet 1  . lidocaine-prilocaine (EMLA) cream Apply 1 application topically as needed. (Patient taking differently: Apply 1 application topically daily as  needed. Port Access) 30 g 0  . magic mouthwash w/lidocaine SOLN Take 5 mLs by mouth 4 (four) times daily as needed for mouth pain. 240 mL 0  . promethazine (PHENERGAN) 25 MG suppository Place 1 suppository (25 mg total) rectally every 6 (six) hours as needed for nausea or vomiting. (Patient not taking: Reported on 02/12/2017) 12 each 4  . Skin Protectants, Misc. (ULTRA-DERM EX) Apply 1 application topically 3 (three) times daily as needed. Redness    . triamcinolone ointment (KENALOG) 0.5 % Apply 1 application topically 2 (two) times daily. 30 g 0  . valACYclovir (VALTREX) 500 MG tablet Take 500 mg by mouth daily.      No current facility-administered medications for this visit.     PHYSICAL EXAMINATION: ECOG PERFORMANCE STATUS: 2 - Symptomatic, <50% confined to bed  Vitals:   02/18/17 1057  BP: (!) 153/100  Pulse: (!) 103  Resp: 18  Temp: 98.3 F (36.8 C)  SpO2: 96%   Filed Weights   02/18/17 1057  Weight: 229 lb 11.2 oz (104.2 kg)    GENERAL:alert, no distress and comfortable SKIN: skin color, texture, turgor are normal, no rashes or significant lesions EYES: normal, Conjunctiva are pink and non-injected, sclera clear OROPHARYNX:no exudate, no erythema and lips, buccal mucosa, and tongue normal  NECK: supple, thyroid normal size, non-tender, without nodularity LYMPH:  no palpable lymphadenopathy in the cervical, axillary or inguinal LUNGS: Diminished breath sounds at the bases HEART: regular rate & rhythm and no murmurs and no lower extremity edema ABDOMEN:abdomen soft, non-tender and normal bowel sounds MUSCULOSKELETAL:no cyanosis of digits and no clubbing  NEURO: alert & oriented x 3 with fluent speech, distal neuropathy EXTREMITIES: 2+ lower extremity edema  LABORATORY DATA:  I have reviewed the data as listed   Chemistry      Component Value Date/Time   NA 137 02/18/2017 1021   K 3.9 02/18/2017 1021   CL 104 02/14/2017 0500   CO2 24 02/18/2017 1021   BUN 12.2  02/18/2017 1021   CREATININE 0.7 02/18/2017 1021      Component Value Date/Time   CALCIUM 9.6 02/18/2017 1021   ALKPHOS 169 (H) 02/18/2017 1021   AST 43 (H) 02/18/2017 1021   ALT 36 02/18/2017 1021   BILITOT 0.56 02/18/2017 1021       Lab Results  Component Value Date   WBC 6.7 02/18/2017   HGB 11.9 02/18/2017   HCT 35.7 02/18/2017   MCV 98.1 02/18/2017   PLT 149 02/18/2017   NEUTROABS 5.4 02/18/2017    ASSESSMENT & PLAN:  Malignant neoplasm of overlapping sites of right breast in female, estrogen receptor positive ([place]) 71 y.o. [place], [place] woman status post right breast overlapping sites biopsy 2 and lymph node biopsy 10/07/2016 all positive for an invasive ductal carcinoma, grade 2, estrogen and progesterone receptor positive, HER-2 nonamplified, with an MIB-1 between 15 and 20%.--This is clinical stage IIIB (1) genetics testing 49/17/9150--V variant of uncertain significance (not clinically actionable) was noted in CTNNA1.  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 There was a 4 cm right breast mass with right axillary lymph nodes and evidence of cirrhosis.  (3) neoadjuvant treatment to consist of cyclophosphamide, methotrexate, and fluorouracil (CMF) chemotherapy every 21 days 8, starting 11/05/2016  (  4) definitive surgery to follow  (5) adjuvant radiation to follow surgery  (6) anti-estrogen therapy to follow at the completion of local treatment ------------------------------------------------------------------------- Current treatment: Cycle 6 CMF  Recent hospitalization for urinary tract infection and confusion: The symptoms have resolved.   Patient received blood transfusion the hospital. This appears to have markedly improved her energy levels.  She is no longer confused.  Labs have been reviewed Monitoring closely for chemotherapy  toxicities Plan for breast MRI and CT scans Followed by surgery evaluation and radiation  Return to clinic after surgery to discuss the pathology report with Dr. [name]    I spent 25 minutes talking to the patient of which more than half was spent in counseling and coordination of care.  Orders Placed This Encounter  Procedures  . MR BREAST BILATERAL W WO CONTRAST    Standing Status:   Future    Standing Expiration Date:   04/20/2018    Order Specific Question:   If indicated for the ordered procedure, I authorize the administration of contrast media per Radiology protocol    Answer:   Yes    Order Specific Question:   What is the patient's sedation requirement?    Answer:   No Sedation    Order Specific Question:   Does the patient have a pacemaker or implanted devices?    Answer:   No    Order Specific Question:   Radiology Contrast Protocol - do NOT remove file path    Answer:   \\[email or web address]    Order Specific Question:   Reason for Exam additional comments    Answer:   Post neoadjuvant chemo    Order Specific Question:   Preferred imaging location?    Answer:   [facility] (table limit-550lbs)  . NM PET Image Restag (PS) Skull Base To Thigh    Standing Status:   Future    Standing Expiration Date:   02/18/2018    Order Specific Question:   If indicated for the ordered procedure, I authorize the administration of a radiopharmaceutical per Radiology protocol    Answer:   Yes    Order Specific Question:   Preferred imaging location?    Answer:   [facility]    Order Specific Question:   Radiology Contrast Protocol - do NOT remove file path    Answer:   \\[email or web address]    Order Specific Question:   Reason for Exam additional comments    Answer:   Metastatic breast cancer restaging   The patient has a good understanding of the overall plan. she agrees with it. she will call with any problems that may  develop before the next visit here.   Rulon Eisenmenger, MD 02/18/17

## 2017-02-18 NOTE — Telephone Encounter (Signed)
No additional appts scheduled - [facility] radiology will contact patient for scans - Msg sent to GM and [place] to confirm if 11/2 appt for f/u is okay or if f/u is still needed in 3 weeks per LOS 10/2

## 2017-02-18 NOTE — Assessment & Plan Note (Signed)
71 y.o. [place], [place] woman status post right breast overlapping sites biopsy 2 and lymph node biopsy 10/07/2016 all positive for an invasive ductal carcinoma, grade 2, estrogen and progesterone receptor positive, HER-2 nonamplified, with an MIB-1 between 15 and 20%.--This is clinical stage IIIB (1) genetics testing 91/55/2536--U variant of uncertain significance (not clinically actionable) was noted in CTNNA1.  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 There was a 4 cm right breast mass with right axillary lymph nodes and evidence of cirrhosis.  (3) neoadjuvant treatment to consist of cyclophosphamide, methotrexate, and fluorouracil (CMF) chemotherapy every 21 days 8, starting 11/05/2016  (4) definitive surgery to follow  (5) adjuvant radiation to follow surgery  (6) anti-estrogen therapy to follow at the completion of local treatment ------------------------------------------------------------------------- Current treatment: Cycle 6 CMF Labs have been reviewed Monitoring closely for chemotherapy toxicities Plan for breast MRI and CT scans Followed by surgery evaluation and radiation  Return to clinic after surgery to discuss the pathology report with Dr. [name]

## 2017-02-19 ENCOUNTER — Observation Stay (HOSPITAL_COMMUNITY)
Admission: EM | Admit: 2017-02-19 | Discharge: 2017-02-21 | Disposition: A | Discharge status: Home / Self Care | Payer: Medicare Other | Attending: Internal Medicine | Admitting: Internal Medicine

## 2017-02-19 ENCOUNTER — Other Ambulatory Visit: Payer: Self-pay

## 2017-02-19 ENCOUNTER — Encounter (HOSPITAL_COMMUNITY): Payer: Self-pay | Admitting: Emergency Medicine

## 2017-02-19 ENCOUNTER — Emergency Department (HOSPITAL_COMMUNITY): Payer: Medicare Other

## 2017-02-19 ENCOUNTER — Inpatient Hospital Stay: Payer: Self-pay | Admitting: Internal Medicine

## 2017-02-19 DIAGNOSIS — Z853 Personal history of malignant neoplasm of breast: Secondary | ICD-10-CM | POA: Diagnosis not present

## 2017-02-19 DIAGNOSIS — D6959 Other secondary thrombocytopenia: Secondary | ICD-10-CM | POA: Diagnosis not present

## 2017-02-19 DIAGNOSIS — D649 Anemia, unspecified: Secondary | ICD-10-CM

## 2017-02-19 DIAGNOSIS — I10 Essential (primary) hypertension: Secondary | ICD-10-CM | POA: Diagnosis not present

## 2017-02-19 DIAGNOSIS — T451X5A Adverse effect of antineoplastic and immunosuppressive drugs, initial encounter: Secondary | ICD-10-CM | POA: Diagnosis not present

## 2017-02-19 DIAGNOSIS — E039 Hypothyroidism, unspecified: Secondary | ICD-10-CM | POA: Diagnosis present

## 2017-02-19 DIAGNOSIS — E034 Atrophy of thyroid (acquired): Secondary | ICD-10-CM | POA: Diagnosis not present

## 2017-02-19 DIAGNOSIS — R2681 Unsteadiness on feet: Secondary | ICD-10-CM | POA: Insufficient documentation

## 2017-02-19 DIAGNOSIS — R7989 Other specified abnormal findings of blood chemistry: Secondary | ICD-10-CM | POA: Insufficient documentation

## 2017-02-19 DIAGNOSIS — R410 Disorientation, unspecified: Secondary | ICD-10-CM | POA: Insufficient documentation

## 2017-02-19 DIAGNOSIS — N390 Urinary tract infection, site not specified: Principal | ICD-10-CM | POA: Insufficient documentation

## 2017-02-19 DIAGNOSIS — R531 Weakness: Secondary | ICD-10-CM

## 2017-02-19 DIAGNOSIS — M6281 Muscle weakness (generalized): Secondary | ICD-10-CM | POA: Insufficient documentation

## 2017-02-19 DIAGNOSIS — C50811 Malignant neoplasm of overlapping sites of right female breast: Secondary | ICD-10-CM

## 2017-02-19 DIAGNOSIS — Z79899 Other long term (current) drug therapy: Secondary | ICD-10-CM | POA: Diagnosis not present

## 2017-02-19 DIAGNOSIS — Z6841 Body Mass Index (BMI) 40.0 and over, adult: Secondary | ICD-10-CM

## 2017-02-19 DIAGNOSIS — Z170 Estrogen receptor positive status [ER+]: Secondary | ICD-10-CM

## 2017-02-19 DIAGNOSIS — R5383 Other fatigue: Secondary | ICD-10-CM | POA: Diagnosis present

## 2017-02-19 DIAGNOSIS — E860 Dehydration: Secondary | ICD-10-CM | POA: Diagnosis present

## 2017-02-19 LAB — CBC
HCT: 32.7 % — ABNORMAL LOW (ref 36.0–46.0)
HEMATOCRIT: 31.4 % — AB (ref 36.0–46.0)
HEMOGLOBIN: 10.7 g/dL — AB (ref 12.0–15.0)
Hemoglobin: 11 g/dL — ABNORMAL LOW (ref 12.0–15.0)
MCH: 32.2 pg (ref 26.0–34.0)
MCH: 32.4 pg (ref 26.0–34.0)
MCHC: 33.6 g/dL (ref 30.0–36.0)
MCHC: 34.1 g/dL (ref 30.0–36.0)
MCV: 95.6 fL (ref 78.0–100.0)
MCV: 95.2 fL (ref 78.0–100.0)
PLATELETS: 133 10*3/uL — AB (ref 150–400)
Platelets: 134 10*3/uL — ABNORMAL LOW (ref 150–400)
RBC: 3.42 MIL/uL — AB (ref 3.87–5.11)
RBC: 3.3 MIL/uL — ABNORMAL LOW (ref 3.87–5.11)
RDW: 16.6 % — ABNORMAL HIGH (ref 11.5–15.5)
RDW: 16.5 % — ABNORMAL HIGH (ref 11.5–15.5)
WBC: 9 10*3/uL (ref 4.0–10.5)
WBC: 10.8 10*3/uL — AB (ref 4.0–10.5)

## 2017-02-19 LAB — URINALYSIS, ROUTINE W REFLEX MICROSCOPIC
Bilirubin Urine: NEGATIVE
Glucose, UA: 150 mg/dL — AB
Ketones, ur: NEGATIVE mg/dL
NITRITE: NEGATIVE
PH: 6 (ref 5.0–8.0)
Protein, ur: NEGATIVE mg/dL
SPECIFIC GRAVITY, URINE: 1.009 (ref 1.005–1.030)

## 2017-02-19 LAB — DIFFERENTIAL
Basophils Absolute: 0 10*3/uL (ref 0.0–0.1)
Basophils Relative: 0 %
EOS PCT: 0 %
Eosinophils Absolute: 0 10*3/uL (ref 0.0–0.7)
LYMPHS ABS: 0.4 10*3/uL — AB (ref 0.7–4.0)
LYMPHS PCT: 5 %
MONOS PCT: 4 %
Monocytes Absolute: 0.3 10*3/uL (ref 0.1–1.0)
NEUTROS PCT: 91 %
Neutro Abs: 8.1 10*3/uL — ABNORMAL HIGH (ref 1.7–7.7)

## 2017-02-19 LAB — COMPREHENSIVE METABOLIC PANEL
ALBUMIN: 2.2 g/dL — AB (ref 3.5–5.0)
ALK PHOS: 155 U/L — AB (ref 38–126)
ALT: 33 U/L (ref 14–54)
ANION GAP: 9 (ref 5–15)
AST: 41 U/L (ref 15–41)
BUN: 16 mg/dL (ref 6–20)
CALCIUM: 8.7 mg/dL — AB (ref 8.9–10.3)
CO2: 22 mmol/L (ref 22–32)
CREATININE: 0.65 mg/dL (ref 0.44–1.00)
Chloride: 101 mmol/L (ref 101–111)
GFR calc Af Amer: >60 mL/min (ref >60)
GFR calc non Af Amer: >60 mL/min (ref >60)
Glucose, Bld: 218 mg/dL — ABNORMAL HIGH (ref 65–99)
Potassium: 3.7 mmol/L (ref 3.5–5.1)
SODIUM: 132 mmol/L — AB (ref 135–145)
TOTAL PROTEIN: 6.2 g/dL — AB (ref 6.5–8.1)
Total Bilirubin: 0.4 mg/dL (ref 0.3–1.2)

## 2017-02-19 LAB — I-STAT CG4 LACTIC ACID, ED
LACTIC ACID, VENOUS: 2.61 mmol/L — AB (ref 0.5–1.9)
Lactic Acid, Venous: 1.91 mmol/L — ABNORMAL HIGH (ref 0.5–1.9)

## 2017-02-19 LAB — CBG MONITORING, ED: GLUCOSE-CAPILLARY: 186 mg/dL — AB (ref 65–99)

## 2017-02-19 LAB — CREATININE, SERUM
Creatinine, Ser: 0.53 mg/dL (ref 0.44–1.00)
GFR calc Af Amer: >60 mL/min (ref >60)

## 2017-02-19 MED ORDER — ONDANSETRON HCL 4 MG/2ML IJ SOLN: 4.0000 mg | Freq: Four times a day (QID) | INTRAMUSCULAR | Status: DC | PRN

## 2017-02-19 MED ORDER — DEXAMETHASONE 4 MG PO TABS
8.0000 mg | ORAL_TABLET | Freq: Every day | ORAL | Status: AC
  Administered 2017-02-19 – 2017-02-20 (×2): 8 mg via ORAL
  Filled 2017-02-19 (×2): qty 2

## 2017-02-19 MED ORDER — CHLORHEXIDINE GLUCONATE 0.12 % MT SOLN
15.0000 mL | Freq: Two times a day (BID) | OROMUCOSAL | Status: DC
  Administered 2017-02-19 – 2017-02-21 (×5): 15 mL via OROMUCOSAL
  Filled 2017-02-19 (×3): qty 15

## 2017-02-19 MED ORDER — SODIUM CHLORIDE 0.9% FLUSH: 10.0000 mL | INTRAVENOUS | Status: DC | PRN

## 2017-02-19 MED ORDER — VALACYCLOVIR HCL 500 MG PO TABS
500.0000 mg | ORAL_TABLET | Freq: Every day | ORAL | Status: DC
  Administered 2017-02-19 – 2017-02-21 (×3): 500 mg via ORAL
  Filled 2017-02-19 (×3): qty 1

## 2017-02-19 MED ORDER — HYDRALAZINE HCL 25 MG PO TABS
25.0000 mg | ORAL_TABLET | Freq: Four times a day (QID) | ORAL | Status: DC | PRN
  Filled 2017-02-19: qty 1

## 2017-02-19 MED ORDER — ACETAMINOPHEN 325 MG PO TABS: 650.0000 mg | ORAL_TABLET | Freq: Four times a day (QID) | ORAL | Status: DC | PRN

## 2017-02-19 MED ORDER — ENOXAPARIN SODIUM 40 MG/0.4ML ~~LOC~~ SOLN
40.0000 mg | Freq: Every day | SUBCUTANEOUS | Status: DC
  Administered 2017-02-19 – 2017-02-21 (×3): 40 mg via SUBCUTANEOUS
  Filled 2017-02-19 (×3): qty 0.4

## 2017-02-19 MED ORDER — CEFTRIAXONE SODIUM 1 G IJ SOLR
1.0000 g | Freq: Once | INTRAMUSCULAR | Status: AC
  Administered 2017-02-19: 1 g via INTRAVENOUS
  Filled 2017-02-19: qty 10

## 2017-02-19 MED ORDER — ORAL CARE MOUTH RINSE
15.0000 mL | Freq: Two times a day (BID) | OROMUCOSAL | Status: DC
  Administered 2017-02-19 – 2017-02-21 (×3): 15 mL via OROMUCOSAL

## 2017-02-19 MED ORDER — MAGIC MOUTHWASH W/LIDOCAINE
5.0000 mL | Freq: Four times a day (QID) | ORAL | Status: DC | PRN
  Filled 2017-02-19: qty 5

## 2017-02-19 MED ORDER — ACETAMINOPHEN 650 MG RE SUPP: 650.0000 mg | Freq: Four times a day (QID) | RECTAL | Status: DC | PRN

## 2017-02-19 MED ORDER — ONDANSETRON HCL 4 MG PO TABS: 4.0000 mg | ORAL_TABLET | Freq: Four times a day (QID) | ORAL | Status: DC | PRN

## 2017-02-19 MED ORDER — FLUCONAZOLE 100 MG PO TABS
100.0000 mg | ORAL_TABLET | Freq: Every day | ORAL | Status: DC
  Administered 2017-02-19 – 2017-02-21 (×3): 100 mg via ORAL
  Filled 2017-02-19 (×3): qty 1

## 2017-02-19 MED ORDER — SODIUM CHLORIDE 0.9 % IV SOLN
INTRAVENOUS | Status: DC
  Administered 2017-02-19 – 2017-02-21 (×4): via INTRAVENOUS

## 2017-02-19 NOTE — H&P (Signed)
TRH H&P    Patient Demographics:    Jasmine Hood, is a 71 y.o. female  MRN: [id number]  DOB - [birth year]  Admit Date - 02/19/2017  Referring MD/NP/PA: Delora Fuel  Outpatient Primary MD for the patient is Marletta Lor, MD  Patient coming from: Home  Chief Complaint  Patient presents with  . Altered Mental Status  . Generalized Weakness      HPI:    Jasmine Hood  is a 71 y.o. female, With a history of breast cancer, with ongoing chemotherapy, recent UTI treated with by mouth Vantin. Patient came to hospital with generalized weakness and fatigue. Also patient was found to be mildly confused. Patient received chemotherapy yesterday  She fell off her recliner yesterday. Patient denies chest pain, no shortness of breath. No nausea vomiting or diarrhea. No fever or chills. She complains of intermittent dysuria. Her appetite is good. She complains of no energy.  In the ED she was again found to have abnormal UA with lactic acid 2.61. She was empirically started on ceftriaxone. Urine culture was obtained. Patient was recently treated for UTI on 02/12/2017. She was discharged home on by mouth Vantin. At that time urine culture grew Klebsiella pneumoniae sensitive to cephalosporins.    Review of systems:    In addition to the HPI above,    All other systems reviewed and are negative.   With Past History of the following :    Past Medical History:  Diagnosis Date  . Breast cancer ([place]) 10/07/2016   bilateral breast   . [place] recluse spider bite    right leg  . COLONIC POLYPS, HX OF 01/05/2009   Qualifier: Diagnosis of  By: [name]  MD, [name]   . Complication of anesthesia    slow to awaken after wisdom  teeth extraction age 89  . Genetic testing 11/27/2016   Ms. Ziolkowski underwent genetic counseling and testing for hereditary cancer syndromes on 11/19/2016. Her results were negative for  mutations in all 46 genes analyzed by [employer]'s 46-gene Common Hereditary Cancers Panel. Genes analyzed include: APC, ATM, AXIN2, BARD1, BMPR1A, BRCA1, BRCA2, BRIP1, CDH1, CDKN2A, CHEK2, CTNNA1, DICER1, EPCAM, GREM1, HOXB13, KIT, MEN1, MLH1, MSH2, MSH3, MSH6, MUTYH, NBN,  . HYPOTHYROIDISM 02/01/2010   Qualifier: Diagnosis of  By: [name]  MD, [name]   . Morbid obesity ([place]) 01/05/2009   Qualifier: Diagnosis of  By: [name]  MD, [name]   . Pneumonia 1987  . Sleep apnea    no cpap used       Past Surgical History:  Procedure Laterality Date  . BREAST BIOPSY Right 10/07/2016   invasive mammary carcinoma  . BREAST BIOPSY Left 10/08/2016   left  breast fibroadenoma no malignancy  . CATARACT EXTRACTION Bilateral   . COLONOSCOPY WITH PROPOFOL N/A 12/22/2015   Procedure: COLONOSCOPY WITH PROPOFOL;  Surgeon: [name], MD;  Location: [facility];  Service: Endoscopy;  Laterality: N/A;  . colonscopy  2012   with polyp removed  . MOLE REMOVAL  10/29/2016   Procedure: MOLE  REMOVAL LEFT CHEST;  Surgeon: Alphonsa Overall, MD;  Location: [facility];  Service: General;;  . PORTACATH PLACEMENT N/A 10/29/2016   Procedure: INSERTION PORT-A-CATH;  Surgeon: Alphonsa Overall, MD;  Location: [facility];  Service: General;  Laterality: N/A;  . surgery for spider bite  1998  . TUBAL LIGATION    . WISDOM TOOTH EXTRACTION  age 22      Social History:      Social History  Substance Use Topics  . Smoking status: Never Smoker  . Smokeless tobacco: Never Used  . Alcohol use No       Family History :     Family History  Problem Relation Age of Onset  . Breast cancer Mother 45       d.52 from metastatic disease  . Throat cancer Father        d.65 history of smoking  . Rheum arthritis Sister        3 sisters pos. for osteo and RA  . Diabetes Maternal Aunt   . Cancer Maternal Grandfather        mouth cancer-chewed tobacco  . Breast cancer Cousin 45  . Breast cancer Daughter 90  . Stomach  cancer Sister 20      Home Medications:   Prior to Admission medications   Medication Sig Start Date End Date Taking? Authorizing Provider  dexamethasone (DECADRON) 4 MG tablet Take 2 tablets (8 mg total) by mouth daily. Start the day after chemotherapy for 2 days. Take with food. 12/17/16  Yes Causey, Charlestine Massed, NP  EPINEPHrine (EPIPEN 2-PAK) 0.3 mg/0.3 mL SOAJ injection Inject 0.3 mLs (0.3 mg total) into the muscle once. Patient taking differently: Inject 0.3 mg into the muscle daily as needed. Allergic Reaction 08/24/13  Yes Marletta Lor, MD  fluconazole (DIFLUCAN) 100 MG tablet Take 2 tablets (200 mg total) by mouth daily. Take for 10 days following treatment, every 21 days Patient taking differently: Take 100 mg by mouth daily. Take for 5 days following treatment, every 21 days 12/24/16  Yes Causey, Charlestine Massed, NP  lidocaine-prilocaine (EMLA) cream Apply 1 application topically as needed. Patient taking differently: Apply 1 application topically daily as needed. Port Access 01/28/17  Yes Magrinat, Virgie Dad, MD  magic mouthwash w/lidocaine SOLN Take 5 mLs by mouth 4 (four) times daily as needed for mouth pain. 11/12/16  Yes Causey, Charlestine Massed, NP  promethazine (PHENERGAN) 25 MG suppository Place 1 suppository (25 mg total) rectally every 6 (six) hours as needed for nausea or vomiting. 11/26/16  Yes Magrinat, Virgie Dad, MD  Skin Protectants, Misc. [employer] EX) Apply 1 application topically 3 (three) times daily as needed. Redness   Yes [provider]  triamcinolone ointment (KENALOG) 0.5 % Apply 1 application topically 2 (two) times daily. 11/14/16  Yes Causey, Charlestine Massed, NP  valACYclovir (VALTREX) 500 MG tablet Take 500 mg by mouth daily.    Yes [provider]     Allergies:     Allergies  Allergen Reactions  . Bee Venom Anaphylaxis     Physical Exam:   Vitals  Blood pressure (!) 171/86, pulse 71, temperature (!) 97.3 F (36.3 C),  temperature source Oral, resp. rate 20, height _0  (1.499 m), weight 103.9 kg (229 lb), SpO2 97 %.  1.  General: Appears presently confused  2. Psychiatric: Alert, oriented 2  3. Neurologic: No focal neurological deficits, all cranial nerves intact.Strength 5/5 all 4 extremities, sensation intact all 4 extremities, plantars down going.  4. Eyes :  anicteric sclerae, moist conjunctivae with no lid lag. PERRLA.  5. ENMT:  Oropharynx clear with moist mucous membranes and good dentition  6. Neck:  supple, no cervical lymphadenopathy appriciated, No thyromegaly  7. Respiratory : Normal respiratory effort, good air movement bilaterally,clear to  auscultation bilaterally  8. Cardiovascular : RRR, no gallops, rubs or murmurs, no leg edema  9. Gastrointestinal:  Positive bowel sounds, abdomen soft, non-tender to palpation,no hepatosplenomegaly, no rigidity or guarding       10. Skin:  No cyanosis, normal texture and turgor, no rash, lesions or ulcers  11.Musculoskeletal:  Good muscle tone,  joints appear normal , no effusions,  normal range of motion    Data Review:    CBC  Recent Labs Lab 02/13/17 0600 02/14/17 0500 02/14/17 2302 02/18/17 1021 02/19/17 0548  WBC 7.7 8.1  --  6.7 9.0  HGB 7.1* 7.1*  --  11.9 11.0*  HCT 21.0* 20.8* 32.5* 35.7 32.7*  PLT 112* 117*  --  149 133*  MCV 100.0 101.0*  --  98.1 95.6  MCH 33.8 34.5*  --  32.6 32.2  MCHC 33.8 34.1  --  33.2 33.6  RDW 14.9 15.2  --  18.1* 16.6*  LYMPHSABS  --  0.6*  --  0.6* 0.4*  MONOABS  --  0.8  --  0.7 0.3  EOSABS  --  0.0  --  0.0 0.0  BASOSABS  --  0.0  --  0.0 0.0   ------------------------------------------------------------------------------------------------------------------  Chemistries   Recent Labs Lab 02/13/17 0600 02/14/17 0500 02/18/17 1021 02/19/17 0548  NA 137 133* 137 132*  K 3.0* 3.2* 3.9 3.7  CL 105 104  --  101  CO2 _0 GLUCOSE 142* 128* 127 218*  BUN 19 13  12.2 16  CREATININE 0.67 0.59 0.7 0.65  CALCIUM 6.6* 6.6* 9.6 8.7*  AST  --   --  43* 41  ALT  --   --  36 33  ALKPHOS  --   --  169* 155*  BILITOT  --   --  0.56 0.4   ------------------------------------------------------------------------------------------------------------------  ------------------------------------------------------------------------------------------------------------------ GFR: Estimated Creatinine Clearance: 66.7 mL/min (by C-G formula based on SCr of 0.65 mg/dL). Liver Function Tests:  Recent Labs Lab 02/18/17 1021 02/19/17 0548  AST 43* 41  ALT 36 33  ALKPHOS 169* 155*  BILITOT 0.56 0.4  PROT 6.6 6.2*  ALBUMIN 2.0* 2.2*     Recent Labs Lab 02/19/17 0612  GLUCAP 186*    --------------------------------------------------------------------------------------------------------------- Urine analysis:    Component Value Date/Time   COLORURINE YELLOW 02/19/2017 0601   APPEARANCEUR HAZY (A) 02/19/2017 0601   LABSPEC 1.009 02/19/2017 0601   PHURINE 6.0 02/19/2017 0601   GLUCOSEU 150 (A) 02/19/2017 0601   GLUCOSEU NEGATIVE 01/25/2010 0858   HGBUR MODERATE (A) 02/19/2017 0601   HGBUR 1+ 12/30/2008 0838   BILIRUBINUR NEGATIVE 02/19/2017 0601   BILIRUBINUR Neg 04/28/2015 1122   KETONESUR NEGATIVE 02/19/2017 0601   PROTEINUR NEGATIVE 02/19/2017 0601   UROBILINOGEN 0.2 04/28/2015 1122   UROBILINOGEN 0.2 01/25/2010 0858   NITRITE NEGATIVE 02/19/2017 0601   LEUKOCYTESUR LARGE (A) 02/19/2017 0601      Imaging Results:    Ct Head Wo Contrast  Result Date: 02/19/2017 CLINICAL DATA:  [place]. Found sitting in floor, slid from recliner. Advanced breast cancer. EXAM: CT HEAD WITHOUT CONTRAST CT CERVICAL SPINE WITHOUT CONTRAST TECHNIQUE: Multidetector CT imaging of the head and cervical spine was performed following  the standard protocol without intravenous contrast. Multiplanar CT image reconstructions of the cervical spine were also generated. COMPARISON:   MRI of the cervicothoracic and lumbar spine 11/11/2016. CT head 11/23/2004. FINDINGS: CT HEAD FINDINGS Brain: No evidence for acute infarction, hemorrhage, mass lesion, hydrocephalus, or extra-axial fluid. Moderate atrophy. Hypoattenuation of white matter likely small vessel disease. Vascular: No hyperdense vessel or unexpected calcification. Skull: Hyperostosis frontalis interna. No fracture. No definite metastatic lesions. Sinuses/Orbits: Clear. Other: None. CT CERVICAL SPINE FINDINGS Alignment: Anatomic. Skull base and vertebrae: No posttraumatic fracture. Metastatic disease at C6, C7, and the thoracic spine is better visualized on MR. No pathologic fracture is evident. Soft tissues and spinal canal: No prevertebral fluid or swelling. No visible canal hematoma. Atherosclerosis. Disc levels:  Spondylosis with disc space narrowing at C5-6. Upper chest: No mass or pneumothorax.  Port-A-Cath. Other: None. IMPRESSION: No skull fracture or intracranial hemorrhage. No cervical spine fracture or traumatic subluxation. Osseous metastatic disease at C6 and below is better visualized on MR. There is no pathologic fracture on today's exam or visible intraspinal tumor/hematoma. Electronically Signed   By: Staci Righter M.D.   On: 02/19/2017 07:16   Ct Cervical Spine Wo Contrast  Result Date: 02/19/2017 CLINICAL DATA:  [place]. Found sitting in floor, slid from recliner. Advanced breast cancer. EXAM: CT HEAD WITHOUT CONTRAST CT CERVICAL SPINE WITHOUT CONTRAST TECHNIQUE: Multidetector CT imaging of the head and cervical spine was performed following the standard protocol without intravenous contrast. Multiplanar CT image reconstructions of the cervical spine were also generated. COMPARISON:  MRI of the cervicothoracic and lumbar spine 11/11/2016. CT head 11/23/2004. FINDINGS: CT HEAD FINDINGS Brain: No evidence for acute infarction, hemorrhage, mass lesion, hydrocephalus, or extra-axial fluid. Moderate atrophy.  Hypoattenuation of white matter likely small vessel disease. Vascular: No hyperdense vessel or unexpected calcification. Skull: Hyperostosis frontalis interna. No fracture. No definite metastatic lesions. Sinuses/Orbits: Clear. Other: None. CT CERVICAL SPINE FINDINGS Alignment: Anatomic. Skull base and vertebrae: No posttraumatic fracture. Metastatic disease at C6, C7, and the thoracic spine is better visualized on MR. No pathologic fracture is evident. Soft tissues and spinal canal: No prevertebral fluid or swelling. No visible canal hematoma. Atherosclerosis. Disc levels:  Spondylosis with disc space narrowing at C5-6. Upper chest: No mass or pneumothorax.  Port-A-Cath. Other: None. IMPRESSION: No skull fracture or intracranial hemorrhage. No cervical spine fracture or traumatic subluxation. Osseous metastatic disease at C6 and below is better visualized on MR. There is no pathologic fracture on today's exam or visible intraspinal tumor/hematoma. Electronically Signed   By: Staci Righter M.D.   On: 02/19/2017 07:16   Dg Chest Port 1 View  Result Date: 02/19/2017 CLINICAL DATA:  Per family, increased weakness, altered mental status over last few days, unwitnessed fall at home tonight EXAM: PORTABLE CHEST 1 VIEW COMPARISON:  02/12/2017 FINDINGS: Central venous catheter with tip over the cavoatrial junction region. No pneumothorax. Shallow inspiration. Heart size and pulmonary vascularity are normal. Lungs are clear and expanded. No blunting of costophrenic angles. No pneumothorax. Tortuous aorta. Degenerative changes in the spine. IMPRESSION: No evidence of active pulmonary disease. Electronically Signed   By: Lucienne Capers M.D.   On: 02/19/2017 06:40    My personal review of EKG: Rhythm NSR   Assessment & Plan:    Active Problems:   Hypothyroidism   Malignant neoplasm of overlapping sites of right breast in female, estrogen receptor positive (HCC)   Generalized weakness   1. Generalized  weakness- likely from after chemotherapy, will place under  observation. Started IV normal saline. We'll obtain PT evaluation. She does have a normal UA and started on ceftriaxone. Follow urine culture results. 2. ? UTI-patient has elevated lactic acid 2.61, with abnormal UA. Though recently she was treated with by mouth antibiotics. Last urine culture from 02/12/2017 showed Klebsiella pneumoniae. Follow serial lactic acid levels. Patient does not appear to be septic 3. Breast cancer with thoracic spine metastasis-she is status post cycle 6 treatment yesterday. Continue Decadron for 2 days, Diflucan, Valtrex. She is followed in oncology as outpatient. 4. Hypertension-blood pressure is elevated, start hydralazine 25 mg by mouth every 6 hours when necessary for BP more than 160/100.   DVT Prophylaxis-   Lovenox   AM Labs Ordered, also please review Full Orders  Family Communication: Admission, patients condition and plan of care including tests being ordered have been discussed with the patient * who indicate understanding and agree with the plan and Code Status.  Code Status:   Full code  Admission status:  Observation  minutes :  60 minutes   Makenah Karas S M.D on 02/19/2017 at 9:13 AM  Between 7am to 7pm - Pager - [phone]. After 7pm go to [email or web address] - password [facility]  [facility] - Office  [phone]

## 2017-02-19 NOTE — ED Triage Notes (Signed)
Brought in by EMS from home with c/o AMS and fall--- pt was found sitting on floor, apparently slid off her recliner chair.  No s/s apparent injury from fall.  Per EMS, pt's daughter reported that pt has been appearing weak and mentally altered.  Pt has had completed her chemotherapy for stage 4 breast CA yesterday.

## 2017-02-19 NOTE — ED Notes (Signed)
Family at bedside. 

## 2017-02-19 NOTE — ED Notes (Signed)
Bed: BV67 Expected date:  Expected time:  Means of arrival:  Comments: 71 yo F  Fall, general weakness, chemo pt

## 2017-02-19 NOTE — ED Notes (Signed)
Pt. Documented in error DG Chest [facility].

## 2017-02-19 NOTE — ED Notes (Signed)
Attempted to call report-floor states they will call back. 

## 2017-02-19 NOTE — ED Notes (Signed)
Called floor-states they will call back

## 2017-02-19 NOTE — Progress Notes (Signed)
A consult was received from an ED physician for rocephin per pharmacy dosing.  The patient's profile has been reviewed for ht/wt/allergies/indication/available labs.   A one time order has been placed for 1g.  Further antibiotics/pharmacy consults should be ordered by admitting physician if indicated.                       Thank you,  Adrian Saran, PharmD, BCPS Pager [phone] 02/19/2017 11:10 AM

## 2017-02-19 NOTE — ED Notes (Signed)
Pt's Lactic Acid was 2.61. Tarri Fuller, MD and RN.

## 2017-02-19 NOTE — ED Provider Notes (Signed)
[facility] DEPT Provider Note   CSN: [id number] Arrival date & time: 02/19/17  0459     History   Chief Complaint Chief Complaint  Patient presents with  . Altered Mental Status  . Generalized Weakness    HPI Jasmine Hood is a 71 y.o. female.  The history is provided by the patient.  Altered Mental Status    she received chemotherapy yesterday for breast cancer. Since then, she has felt generally fatigued. Family states that she appears mentally altered. She did fall off of her recliner yesterday and she initially had some pain in the right side of her chest and abdomen, the pain has subsided. She is not complaining of any pain currently. She denies hitting her head. She denies fever or chills.  Past Medical History:  Diagnosis Date  . Breast cancer ([place]) 10/07/2016   bilateral breast   . [place] recluse spider bite    right leg  . COLONIC POLYPS, HX OF 01/05/2009   Qualifier: Diagnosis of  By: [name]  MD, [name]   . Complication of anesthesia    slow to awaken after wisdom  teeth extraction age 61  . Genetic testing 11/27/2016   Ms. Bari underwent genetic counseling and testing for hereditary cancer syndromes on 11/19/2016. Her results were negative for mutations in all 46 genes analyzed by [employer]'s 46-gene Common Hereditary Cancers Panel. Genes analyzed include: APC, ATM, AXIN2, BARD1, BMPR1A, BRCA1, BRCA2, BRIP1, CDH1, CDKN2A, CHEK2, CTNNA1, DICER1, EPCAM, GREM1, HOXB13, KIT, MEN1, MLH1, MSH2, MSH3, MSH6, MUTYH, NBN,  . HYPOTHYROIDISM 02/01/2010   Qualifier: Diagnosis of  By: [name]  MD, [name]   . Morbid obesity ([place]) 01/05/2009   Qualifier: Diagnosis of  By: [name]  MD, [name]   . Pneumonia 1987  . Sleep apnea    no cpap used     Patient Active Problem List   Diagnosis Date Noted  . AKI (acute kidney injury) ([place]) 02/12/2017  . Dehydration 02/12/2017  . Abnormal TSH 02/12/2017  . Genetic testing 11/27/2016  . Port catheter in place  11/14/2016  . Bone metastases ([place]) 11/11/2016  . Morbid obesity with BMI of 50.0-59.9, adult ([place]) 11/05/2016  . Cirrhosis of liver ([place]) 11/05/2016  . Malignant neoplasm of overlapping sites of right breast in female, estrogen receptor positive ([place]) 10/21/2016  . Encounter for follow-up surveillance of colon cancer   . Benign neoplasm of ascending colon   . Benign neoplasm of descending colon   . Benign neoplasm of transverse colon   . Benign neoplasm of cecum   . OSA (obstructive sleep apnea) 09/14/2012  . Contact dermatitis and eczema due to plant 12/31/2011  . Hypothyroidism 02/01/2010  . Essential hypertension 01/05/2009  . History of colonic polyps 01/05/2009    Past Surgical History:  Procedure Laterality Date  . BREAST BIOPSY Right 10/07/2016   invasive mammary carcinoma  . BREAST BIOPSY Left 10/08/2016   left  breast fibroadenoma no malignancy  . CATARACT EXTRACTION Bilateral   . COLONOSCOPY WITH PROPOFOL N/A 12/22/2015   Procedure: COLONOSCOPY WITH PROPOFOL;  Surgeon: [name], MD;  Location: [facility];  Service: Endoscopy;  Laterality: N/A;  . colonscopy  2012   with polyp removed  . MOLE REMOVAL  10/29/2016   Procedure: MOLE REMOVAL LEFT CHEST;  Surgeon: Alphonsa Overall, MD;  Location: [facility] ORS;  Service: General;;  . PORTACATH PLACEMENT N/A 10/29/2016   Procedure: INSERTION PORT-A-CATH;  Surgeon: Alphonsa Overall, MD;  Location: [facility] ORS;  Service: General;  Laterality: N/A;  . surgery for spider bite  1998  . TUBAL LIGATION    . WISDOM TOOTH EXTRACTION  age 40    OB History    No data available       Home Medications    Prior to Admission medications   Medication Sig Start Date End Date Taking? Authorizing Provider  dexamethasone (DECADRON) 4 MG tablet Take 2 tablets (8 mg total) by mouth daily. Start the day after chemotherapy for 2 days. Take with food. 12/17/16  Yes Causey, Charlestine Massed, NP  EPINEPHrine (EPIPEN 2-PAK) 0.3 mg/0.3 mL SOAJ injection  Inject 0.3 mLs (0.3 mg total) into the muscle once. Patient taking differently: Inject 0.3 mg into the muscle daily as needed. Allergic Reaction 08/24/13  Yes Marletta Lor, MD  fluconazole (DIFLUCAN) 100 MG tablet Take 2 tablets (200 mg total) by mouth daily. Take for 10 days following treatment, every 21 days Patient taking differently: Take 100 mg by mouth daily. Take for 5 days following treatment, every 21 days 12/24/16  Yes Causey, Charlestine Massed, NP  lidocaine-prilocaine (EMLA) cream Apply 1 application topically as needed. Patient taking differently: Apply 1 application topically daily as needed. Port Access 01/28/17  Yes Magrinat, Virgie Dad, MD  magic mouthwash w/lidocaine SOLN Take 5 mLs by mouth 4 (four) times daily as needed for mouth pain. 11/12/16  Yes Causey, Charlestine Massed, NP  promethazine (PHENERGAN) 25 MG suppository Place 1 suppository (25 mg total) rectally every 6 (six) hours as needed for nausea or vomiting. 11/26/16  Yes Magrinat, Virgie Dad, MD  Skin Protectants, Misc. [employer] EX) Apply 1 application topically 3 (three) times daily as needed. Redness   Yes [provider]  triamcinolone ointment (KENALOG) 0.5 % Apply 1 application topically 2 (two) times daily. 11/14/16  Yes Causey, Charlestine Massed, NP  valACYclovir (VALTREX) 500 MG tablet Take 500 mg by mouth daily.    Yes [provider]    Family History Family History  Problem Relation Age of Onset  . Breast cancer Mother 62       d.52 from metastatic disease  . Throat cancer Father        d.65 history of smoking  . Rheum arthritis Sister        3 sisters pos. for osteo and RA  . Diabetes Maternal Aunt   . Cancer Maternal Grandfather        mouth cancer-chewed tobacco  . Breast cancer Cousin 31  . Breast cancer Daughter 31  . Stomach cancer Sister 15    Social History Social History  Substance Use Topics  . Smoking status: Never Smoker  . Smokeless tobacco: Never Used  . Alcohol  use No     Allergies   Bee venom   Review of Systems Review of Systems  All other systems reviewed and are negative.    Physical Exam Updated Vital Signs BP (!) 158/102   Pulse 91   Temp (!) 97.3 F (36.3 C) (Oral)   Resp 18   Ht 4' 11"  (1.499 m)   Wt 103.9 kg (229 lb)   SpO2 97%   BMI 46.25 kg/m   Physical Exam  Nursing note and vitals reviewed.  71 year old female, resting comfortably and in no acute distress. Vital signs are significant for hypertension. Oxygen saturation is 97%, which is normal. Head is normocephalic and atraumatic. PERRLA, EOMI. Oropharynx is clear. Neck is nontender and supple without adenopathy or JVD. Back is nontender and there is  no CVA tenderness. Lungs are clear without rales, wheezes, or rhonchi. Chest is nontender. Mediport iw present on the left. Heart has regular rate and rhythm without murmur. Abdomen is soft, flat, nontender without masses or hepatosplenomegaly and peristalsis is normoactive. Extremities have 2+ edema, full range of motion is present. Skin is warm and dry without rash. Neurologic: She is awake and alert and oriented, but is generally slow with speech and generally slow with response, cranial nerves are intact, there are no motor or sensory deficits.  ED Treatments / Results  Labs (all labs ordered are listed, but only abnormal results are displayed) Labs Reviewed  COMPREHENSIVE METABOLIC PANEL - Abnormal; Notable for the following:       Result Value   Sodium 132 (*)    Glucose, Bld 218 (*)    Calcium 8.7 (*)    Total Protein 6.2 (*)    Albumin 2.2 (*)    Alkaline Phosphatase 155 (*)    All other components within normal limits  CBC - Abnormal; Notable for the following:    RBC 3.42 (*)    Hemoglobin 11.0 (*)    HCT 32.7 (*)    RDW 16.6 (*)    Platelets 133 (*)    All other components within normal limits  URINALYSIS, ROUTINE W REFLEX MICROSCOPIC - Abnormal; Notable for the following:    APPearance HAZY  (*)    Glucose, UA 150 (*)    Hgb urine dipstick MODERATE (*)    Leukocytes, UA LARGE (*)    Bacteria, UA RARE (*)    Squamous Epithelial / LPF 0-5 (*)    All other components within normal limits  DIFFERENTIAL - Abnormal; Notable for the following:    Neutro Abs 8.1 (*)    Lymphs Abs 0.4 (*)    All other components within normal limits  CBG MONITORING, ED - Abnormal; Notable for the following:    Glucose-Capillary 186 (*)    All other components within normal limits  I-STAT CG4 LACTIC ACID, ED - Abnormal; Notable for the following:    Lactic Acid, Venous 2.61 (*)    All other components within normal limits  CULTURE, BLOOD (ROUTINE X 2)  CULTURE, BLOOD (ROUTINE X 2)  URINE CULTURE    EKG  EKG Interpretation  Date/Time:  Wednesday February 19 2017 05:23:05 EDT Ventricular Rate:  88 PR Interval:    QRS Duration: 101 QT Interval:  395 QTC Calculation: 478 R Axis:   -24 Text Interpretation:  Sinus rhythm Borderline left axis deviation Low voltage, precordial leads Minimal ST elevation, inferior leads When compared with ECG of 02/12/2017, HEART RATE has decreased Confirmed by Delora Fuel ([id number]) on 02/19/2017 6:21:02 AM       Radiology Ct Head Wo Contrast  Result Date: 02/19/2017 CLINICAL DATA:  [place]. Found sitting in floor, slid from recliner. Advanced breast cancer. EXAM: CT HEAD WITHOUT CONTRAST CT CERVICAL SPINE WITHOUT CONTRAST TECHNIQUE: Multidetector CT imaging of the head and cervical spine was performed following the standard protocol without intravenous contrast. Multiplanar CT image reconstructions of the cervical spine were also generated. COMPARISON:  MRI of the cervicothoracic and lumbar spine 11/11/2016. CT head 11/23/2004. FINDINGS: CT HEAD FINDINGS Brain: No evidence for acute infarction, hemorrhage, mass lesion, hydrocephalus, or extra-axial fluid. Moderate atrophy. Hypoattenuation of white matter likely small vessel disease. Vascular: No hyperdense vessel or  unexpected calcification. Skull: Hyperostosis frontalis interna. No fracture. No definite metastatic lesions. Sinuses/Orbits: Clear. Other: None. CT CERVICAL SPINE FINDINGS Alignment: Anatomic.  Skull base and vertebrae: No posttraumatic fracture. Metastatic disease at C6, C7, and the thoracic spine is better visualized on MR. No pathologic fracture is evident. Soft tissues and spinal canal: No prevertebral fluid or swelling. No visible canal hematoma. Atherosclerosis. Disc levels:  Spondylosis with disc space narrowing at C5-6. Upper chest: No mass or pneumothorax.  Port-A-Cath. Other: None. IMPRESSION: No skull fracture or intracranial hemorrhage. No cervical spine fracture or traumatic subluxation. Osseous metastatic disease at C6 and below is better visualized on MR. There is no pathologic fracture on today's exam or visible intraspinal tumor/hematoma. Electronically Signed   By: Staci Righter M.D.   On: 02/19/2017 07:16   Ct Cervical Spine Wo Contrast  Result Date: 02/19/2017 CLINICAL DATA:  [place]. Found sitting in floor, slid from recliner. Advanced breast cancer. EXAM: CT HEAD WITHOUT CONTRAST CT CERVICAL SPINE WITHOUT CONTRAST TECHNIQUE: Multidetector CT imaging of the head and cervical spine was performed following the standard protocol without intravenous contrast. Multiplanar CT image reconstructions of the cervical spine were also generated. COMPARISON:  MRI of the cervicothoracic and lumbar spine 11/11/2016. CT head 11/23/2004. FINDINGS: CT HEAD FINDINGS Brain: No evidence for acute infarction, hemorrhage, mass lesion, hydrocephalus, or extra-axial fluid. Moderate atrophy. Hypoattenuation of white matter likely small vessel disease. Vascular: No hyperdense vessel or unexpected calcification. Skull: Hyperostosis frontalis interna. No fracture. No definite metastatic lesions. Sinuses/Orbits: Clear. Other: None. CT CERVICAL SPINE FINDINGS Alignment: Anatomic. Skull base and vertebrae: No posttraumatic  fracture. Metastatic disease at C6, C7, and the thoracic spine is better visualized on MR. No pathologic fracture is evident. Soft tissues and spinal canal: No prevertebral fluid or swelling. No visible canal hematoma. Atherosclerosis. Disc levels:  Spondylosis with disc space narrowing at C5-6. Upper chest: No mass or pneumothorax.  Port-A-Cath. Other: None. IMPRESSION: No skull fracture or intracranial hemorrhage. No cervical spine fracture or traumatic subluxation. Osseous metastatic disease at C6 and below is better visualized on MR. There is no pathologic fracture on today's exam or visible intraspinal tumor/hematoma. Electronically Signed   By: Staci Righter M.D.   On: 02/19/2017 07:16   Dg Chest Port 1 View  Result Date: 02/19/2017 CLINICAL DATA:  Per family, increased weakness, altered mental status over last few days, unwitnessed fall at home tonight EXAM: PORTABLE CHEST 1 VIEW COMPARISON:  02/12/2017 FINDINGS: Central venous catheter with tip over the cavoatrial junction region. No pneumothorax. Shallow inspiration. Heart size and pulmonary vascularity are normal. Lungs are clear and expanded. No blunting of costophrenic angles. No pneumothorax. Tortuous aorta. Degenerative changes in the spine. IMPRESSION: No evidence of active pulmonary disease. Electronically Signed   By: Lucienne Capers M.D.   On: 02/19/2017 06:40    Procedures Procedures (including critical care time)  Medications Ordered in ED Medications  cefTRIAXone (ROCEPHIN) 1 g in dextrose 5 % 50 mL IVPB (1 g Intravenous New Bag/Given 02/19/17 0719)     Initial Impression / Assessment and Plan / ED Course  I have reviewed the triage vital signs and the nursing notes.  Pertinent labs & imaging results that were available during my care of the patient were reviewed by me and considered in my medical decision making (see chart for details).  Lethargy and altered mental status in patient receiving cancer chemotherapy. Old  records are reviewed, and she received cyclophosphamide, methotrexate, fluorouracil infusions yesterday. WBC yesterday was 6.7, so I doubt she has serious neutropenia today. She'll be sent for CT of head and cervical spine, screening labs obtained. Will screen for  possible urinary tract infection with urinalysis. Of note, she did have a recent hospitalization for urinary tract infection due to Klebsiella which was treated with oral cephalosporins.  CT has come back with no acute change. Laboratory evaluation shows stable anemia and thrombocytopenia which are presumably secondary to chemotherapy. No leukopenia. Urinalysis still shows signs of infection with too numerous to count WBCs. She is given intravenous ceftriaxone. Case is discussed with Dr. Darrick Meigs of [facility] who agrees to admit the patient.  Final Clinical Impressions(s) / ED Diagnoses   Final diagnoses:  Urinary tract infection without hematuria, site unspecified  Weakness  Confusion  Elevated lactic acid level  Normochromic normocytic anemia  Chemotherapy-induced thrombocytopenia    New Prescriptions New Prescriptions   No medications on file     Delora Fuel, MD 65/[birth year]/60 [phone]

## 2017-02-20 DIAGNOSIS — R7989 Other specified abnormal findings of blood chemistry: Secondary | ICD-10-CM

## 2017-02-20 DIAGNOSIS — Z170 Estrogen receptor positive status [ER+]: Secondary | ICD-10-CM

## 2017-02-20 DIAGNOSIS — C50811 Malignant neoplasm of overlapping sites of right female breast: Secondary | ICD-10-CM | POA: Diagnosis not present

## 2017-02-20 DIAGNOSIS — R531 Weakness: Secondary | ICD-10-CM | POA: Diagnosis not present

## 2017-02-20 DIAGNOSIS — E860 Dehydration: Secondary | ICD-10-CM

## 2017-02-20 DIAGNOSIS — N390 Urinary tract infection, site not specified: Secondary | ICD-10-CM | POA: Diagnosis not present

## 2017-02-20 DIAGNOSIS — I10 Essential (primary) hypertension: Secondary | ICD-10-CM

## 2017-02-20 LAB — CBC
HCT: 30.2 % — ABNORMAL LOW (ref 36.0–46.0)
Hemoglobin: 9.9 g/dL — ABNORMAL LOW (ref 12.0–15.0)
MCH: 31.9 pg (ref 26.0–34.0)
MCHC: 32.8 g/dL (ref 30.0–36.0)
MCV: 97.4 fL (ref 78.0–100.0)
PLATELETS: 124 10*3/uL — AB (ref 150–400)
RBC: 3.1 MIL/uL — AB (ref 3.87–5.11)
RDW: 16.7 % — ABNORMAL HIGH (ref 11.5–15.5)
WBC: 7.7 10*3/uL (ref 4.0–10.5)

## 2017-02-20 LAB — COMPREHENSIVE METABOLIC PANEL
ALT: 36 U/L (ref 14–54)
AST: 46 U/L — ABNORMAL HIGH (ref 15–41)
Albumin: 2.1 g/dL — ABNORMAL LOW (ref 3.5–5.0)
Alkaline Phosphatase: 152 U/L — ABNORMAL HIGH (ref 38–126)
Anion gap: 8 (ref 5–15)
BUN: 17 mg/dL (ref 6–20)
CHLORIDE: 106 mmol/L (ref 101–111)
CO2: 25 mmol/L (ref 22–32)
Calcium: 8.2 mg/dL — ABNORMAL LOW (ref 8.9–10.3)
Creatinine, Ser: 0.59 mg/dL (ref 0.44–1.00)
GFR calc Af Amer: >60 mL/min (ref >60)
GFR calc non Af Amer: >60 mL/min (ref >60)
Glucose, Bld: 162 mg/dL — ABNORMAL HIGH (ref 65–99)
POTASSIUM: 3.9 mmol/L (ref 3.5–5.1)
SODIUM: 139 mmol/L (ref 135–145)
Total Bilirubin: <0.1 mg/dL — ABNORMAL LOW (ref 0.3–1.2)
Total Protein: 5.6 g/dL — ABNORMAL LOW (ref 6.5–8.1)

## 2017-02-20 LAB — URINE CULTURE: CULTURE: NO GROWTH

## 2017-02-20 LAB — LACTIC ACID, PLASMA: LACTIC ACID, VENOUS: 2.6 mmol/L — AB (ref 0.5–1.9)

## 2017-02-20 MED ORDER — DEXTROSE 5 % IV SOLN
1.0000 g | INTRAVENOUS | Status: DC
  Administered 2017-02-20 – 2017-02-21 (×2): 1 g via INTRAVENOUS
  Filled 2017-02-20 (×2): qty 10

## 2017-02-20 NOTE — Progress Notes (Addendum)
    Durable Medical Equipment        Start     Ordered   02/20/17 1615  For home use only DME lightweight manual wheelchair with seat cushion  Once    Comments:  Patient suffers from breast cancer undergoing chemotherapy therapy with significant debility, which impairs their ability to perform daily activities in the home.  A walking aid will not resolve  issue with performing activities of daily living. A wheelchair will allow patient to safely perform daily activities. Patient is not able to propel themselves in the home using a standard weight wheelchair due to weakness. Patient can self propel in the lightweight wheelchair.  Accessories: elevating leg rests (ELRs), wheel locks, extensions and anti-tippers.   02/20/17 1616

## 2017-02-20 NOTE — Progress Notes (Signed)
PROGRESS NOTE    Jasmine Hood  NWG:[id number] DOB: 08/22/1945 DOA: 02/19/2017 PCP: Marletta Lor, MD    Brief Narrative:  Patient is a 71 year old female history of breast cancer ongoing chemotherapy, recently treated for UTI with Vantin presented to the hospital with generalized weakness and fatigue. There was also considerable confusion. Patient with complaints of dysuria. Urinalysis worrisome for UTI. Patient placed empirically on IV Rocephin. Patient has been pancultured.   Assessment & Plan:   Principal Problem:   Generalized weakness Active Problems:   Acute lower UTI   Hypothyroidism   Essential hypertension   Malignant neoplasm of overlapping sites of right breast in female, estrogen receptor positive ([place])   Morbid obesity with BMI of 50.0-59.9, adult (HCC)   Dehydration  #1 generalized weakness Questionable etiology. Likely secondary to chemotherapy in the setting of probable UTI. Patient has been pancultured results pending. Patient also noted to have elevated lactic acid level. Continue IV fluids, supportive care. Place on IV Rocephin. PT/OT.  #2 UTI Urine cultures pending. Patient noted to have urine cultures positive for Klebsiella pneumonia 02/12/2017. Patient has been pancultured results are pending. Place on IV Rocephin pending culture results. Follow.  #3 hypertension Blood pressure improved. Hydralazine as needed.  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 Continue Decadron 1 more day, Diflucan, Valtrex. Outpatient follow-up with oncology. Patient's oncologist has been notified of admission via [place].  #5 ?? hypothyroidism TSH was 2.129 02/12/2017. Patient not on Synthroid. Outpatient follow-up.  #6   DVT prophylaxis: Lovenox Code Status: Full Family Communication: Updated patient. No family at bedside. Disposition Plan: Home with home health when medically stable  and clinically improved and back at baseline.   Consultants:   None  Procedures:   CT head CT C-spine 02/19/2017  Chest x-ray 02/19/2017  Antimicrobials:   IV Rocephin 02/19/2017   Subjective: Patient sitting up in chair. Patient states feeling a little bit better than on admission however not at baseline. No chest pain. No shortness of breath. Some improvement with weakness. Patient with complaints of dysuria.  Objective: Vitals:   02/19/17 1311 02/19/17 1939 02/20/17 0451 02/20/17 1458  BP: (!) 156/80 (!) 159/95 126/68 (!) 147/71  Pulse: 88 90 71 71  Resp: 20 18 18 18   Temp: (!) 97 F (36.1 C) 97.9 F (36.6 C) 98 F (36.7 C) 97.7 F (36.5 C)  TempSrc: Oral Oral Oral Oral  SpO2: 97% 98% 99% 98%  Weight:      Height:        Intake/Output Summary (Last 24 hours) at 02/20/17 1708 Last data filed at 02/20/17 0934  Gross per 24 hour  Intake             1080 ml  Output             1200 ml  Net             -120 ml   Filed Weights   02/19/17 0605 02/19/17 1100  Weight: 103.9 kg (229 lb) 103.1 kg (227 lb 4.7 oz)    Examination:  General exam: NAD Respiratory system: Clear to auscultation. No wheezes, no crackles, no rhonchi. Respiratory effort normal. Cardiovascular system: S1 & S2 heard, RRR. No JVD, murmurs, rubs, gallops or clicks. Trace to 1+ bilateral lower extremity edema. Gastrointestinal system: Abdomen is nondistended, soft and nontender. No organomegaly or masses felt. Normal bowel sounds heard. Central nervous system: Alert and  oriented. No focal neurological deficits. Extremities: Symmetric 5 x 5 power. Skin: No rashes, lesions or ulcers Psychiatry: Judgement and insight appear normal. Mood & affect appropriate.     Data Reviewed: I have personally reviewed following labs and imaging studies  CBC:  Recent Labs Lab 02/14/17 0500 02/14/17 2302 02/18/17 1021 02/19/17 0548 02/19/17 1145 02/20/17 0540  WBC 8.1  --  6.7 9.0 10.8* 7.7    NEUTROABS 6.7  --  5.4 8.1*  --   --   HGB 7.1*  --  11.9 11.0* 10.7* 9.9*  HCT 20.8* 32.5* 35.7 32.7* 31.4* 30.2*  MCV 101.0*  --  98.1 95.6 95.2 97.4  PLT 117*  --  149 133* 134* 785*   Basic Metabolic Panel:  Recent Labs Lab 02/14/17 0500 02/18/17 1021 02/19/17 0548 02/19/17 1145 02/20/17 0540  NA 133* 137 132*  --  139  K 3.2* 3.9 3.7  --  3.9  CL 104  --  101  --  106  CO2 23 24 22   --  25  GLUCOSE 128* 127 218*  --  162*  BUN 13 12.2 16  --  17  CREATININE 0.59 0.7 0.65 0.53 0.59  CALCIUM 6.6* 9.6 8.7*  --  8.2*   GFR: Estimated Creatinine Clearance: 66.4 mL/min (by C-G formula based on SCr of 0.59 mg/dL). Liver Function Tests:  Recent Labs Lab 02/18/17 1021 02/19/17 0548 02/20/17 0540  AST 43* 41 46*  ALT 36 33 36  ALKPHOS 169* 155* 152*  BILITOT 0.56 0.4 <0.1*  PROT 6.6 6.2* 5.6*  ALBUMIN 2.0* 2.2* 2.1*   No results for input(s): LIPASE, AMYLASE in the last 168 hours. No results for input(s): AMMONIA in the last 168 hours. Coagulation Profile: No results for input(s): INR, PROTIME in the last 168 hours. Cardiac Enzymes: No results for input(s): CKTOTAL, CKMB, CKMBINDEX, TROPONINI in the last 168 hours. BNP (last 3 results) No results for input(s): PROBNP in the last 8760 hours. HbA1C: No results for input(s): HGBA1C in the last 72 hours. CBG:  Recent Labs Lab 02/19/17 0612  GLUCAP 186*   Lipid Profile: No results for input(s): CHOL, HDL, LDLCALC, TRIG, CHOLHDL, LDLDIRECT in the last 72 hours. Thyroid Function Tests: No results for input(s): TSH, T4TOTAL, FREET4, T3FREE, THYROIDAB in the last 72 hours. Anemia Panel: No results for input(s): VITAMINB12, FOLATE, FERRITIN, TIBC, IRON, RETICCTPCT in the last 72 hours. Sepsis Labs:  Recent Labs Lab 02/19/17 0553 02/19/17 1004 02/20/17 1058  LATICACIDVEN 2.61* 1.91* 2.6*    Recent Results (from the past 240 hour(s))  Culture, Urine     Status: Abnormal   Collection Time: 02/12/17  6:00 AM   Result Value Ref Range Status   Specimen Description URINE, RANDOM  Final   Special Requests Immunocompromised  Final   Culture >=100,000 COLONIES/mL KLEBSIELLA PNEUMONIAE (A)  Final   Report Status 02/15/2017 FINAL  Final   Organism ID, Bacteria KLEBSIELLA PNEUMONIAE (A)  Final      Susceptibility   Klebsiella pneumoniae - MIC*    AMPICILLIN >=32 RESISTANT Resistant     CEFAZOLIN <=4 SENSITIVE Sensitive     CEFTRIAXONE <=1 SENSITIVE Sensitive     CIPROFLOXACIN <=0.25 SENSITIVE Sensitive     GENTAMICIN <=1 SENSITIVE Sensitive     IMIPENEM <=0.25 SENSITIVE Sensitive     NITROFURANTOIN 128 RESISTANT Resistant     TRIMETH/SULFA <=20 SENSITIVE Sensitive     AMPICILLIN/SULBACTAM 8 SENSITIVE Sensitive     PIP/TAZO <=4 SENSITIVE Sensitive  Extended ESBL NEGATIVE Sensitive     * >=100,000 COLONIES/mL KLEBSIELLA PNEUMONIAE  Culture, blood (Routine x 2)     Status: None (Preliminary result)   Collection Time: 02/19/17  5:48 AM  Result Value Ref Range Status   Specimen Description BLOOD PORTA CATH  Final   Special Requests   Final    BOTTLES DRAWN AEROBIC AND ANAEROBIC Blood Culture adequate volume   Culture   Final    NO GROWTH 1 DAY Performed at [facility], [place] [address], [place], [place] [address]    Report Status PENDING  Incomplete  Culture, blood (Routine x 2)     Status: None (Preliminary result)   Collection Time: 02/19/17  5:53 AM  Result Value Ref Range Status   Specimen Description BLOOD LEFT ANTECUBITAL  Final   Special Requests IN PEDIATRIC BOTTLE Blood Culture adequate volume  Final   Culture   Final    NO GROWTH 1 DAY Performed at [facility], [place] [address] [place], [place] [address]    Report Status PENDING  Incomplete  Urine culture     Status: None   Collection Time: 02/19/17  6:01 AM  Result Value Ref Range Status   Specimen Description URINE, RANDOM  Final   Special Requests NONE  Final   Culture   Final    NO GROWTH Performed at [facility], [address], [place], [place] [address]    Report Status 02/20/2017 FINAL  Final         Radiology Studies: Ct Head Wo Contrast  Result Date: 02/19/2017 CLINICAL DATA:  [place]. Found sitting in floor, slid from recliner. Advanced breast cancer. EXAM: CT HEAD WITHOUT CONTRAST CT CERVICAL SPINE WITHOUT CONTRAST TECHNIQUE: Multidetector CT imaging of the head and cervical spine was performed following the standard protocol without intravenous contrast. Multiplanar CT image reconstructions of the cervical spine were also generated. COMPARISON:  MRI of the cervicothoracic and lumbar spine 11/11/2016. CT head 11/23/2004. FINDINGS: CT HEAD FINDINGS Brain: No evidence for acute infarction, hemorrhage, mass lesion, hydrocephalus, or extra-axial fluid. Moderate atrophy. Hypoattenuation of white matter likely small vessel disease. Vascular: No hyperdense vessel or unexpected calcification. Skull: Hyperostosis frontalis interna. No fracture. No definite metastatic lesions. Sinuses/Orbits: Clear. Other: None. CT CERVICAL SPINE FINDINGS Alignment: Anatomic. Skull base and vertebrae: No posttraumatic fracture. Metastatic disease at C6, C7, and the thoracic spine is better visualized on MR. No pathologic fracture is evident. Soft tissues and spinal canal: No prevertebral fluid or swelling. No visible canal hematoma. Atherosclerosis. Disc levels:  Spondylosis with disc space narrowing at C5-6. Upper chest: No mass or pneumothorax.  Port-A-Cath. Other: None. IMPRESSION: No skull fracture or intracranial hemorrhage. No cervical spine fracture or traumatic subluxation. Osseous metastatic disease at C6 and below is better visualized on MR. There is no pathologic fracture on today's exam or visible intraspinal tumor/hematoma. Electronically Signed   By: Staci Righter M.D.   On: 02/19/2017 07:16   Ct Cervical Spine Wo Contrast  Result Date: 02/19/2017 CLINICAL DATA:  [place]. Found sitting in floor, slid from  recliner. Advanced breast cancer. EXAM: CT HEAD WITHOUT CONTRAST CT CERVICAL SPINE WITHOUT CONTRAST TECHNIQUE: Multidetector CT imaging of the head and cervical spine was performed following the standard protocol without intravenous contrast. Multiplanar CT image reconstructions of the cervical spine were also generated. COMPARISON:  MRI of the cervicothoracic and lumbar spine 11/11/2016. CT head 11/23/2004. FINDINGS: CT HEAD FINDINGS Brain: No evidence for acute infarction,  hemorrhage, mass lesion, hydrocephalus, or extra-axial fluid. Moderate atrophy. Hypoattenuation of white matter likely small vessel disease. Vascular: No hyperdense vessel or unexpected calcification. Skull: Hyperostosis frontalis interna. No fracture. No definite metastatic lesions. Sinuses/Orbits: Clear. Other: None. CT CERVICAL SPINE FINDINGS Alignment: Anatomic. Skull base and vertebrae: No posttraumatic fracture. Metastatic disease at C6, C7, and the thoracic spine is better visualized on MR. No pathologic fracture is evident. Soft tissues and spinal canal: No prevertebral fluid or swelling. No visible canal hematoma. Atherosclerosis. Disc levels:  Spondylosis with disc space narrowing at C5-6. Upper chest: No mass or pneumothorax.  Port-A-Cath. Other: None. IMPRESSION: No skull fracture or intracranial hemorrhage. No cervical spine fracture or traumatic subluxation. Osseous metastatic disease at C6 and below is better visualized on MR. There is no pathologic fracture on today's exam or visible intraspinal tumor/hematoma. Electronically Signed   By: Staci Righter M.D.   On: 02/19/2017 07:16   Dg Chest Port 1 View  Result Date: 02/19/2017 CLINICAL DATA:  Per family, increased weakness, altered mental status over last few days, unwitnessed fall at home tonight EXAM: PORTABLE CHEST 1 VIEW COMPARISON:  02/12/2017 FINDINGS: Central venous catheter with tip over the cavoatrial junction region. No pneumothorax. Shallow inspiration. Heart size  and pulmonary vascularity are normal. Lungs are clear and expanded. No blunting of costophrenic angles. No pneumothorax. Tortuous aorta. Degenerative changes in the spine. IMPRESSION: No evidence of active pulmonary disease. Electronically Signed   By: Lucienne Capers M.D.   On: 02/19/2017 06:40        Scheduled Meds: . chlorhexidine  15 mL Mouth Rinse BID  . enoxaparin (LOVENOX) injection  40 mg Subcutaneous Daily  . fluconazole  100 mg Oral Daily  . mouth rinse  15 mL Mouth Rinse q12n4p  . valACYclovir  500 mg Oral Daily   Continuous Infusions: . sodium chloride 75 mL/hr at 02/19/17 1659  . cefTRIAXone (ROCEPHIN)  IV 1 g (02/20/17 1258)     LOS: 0 days    Time spent: 3 mins    Zaya Kessenich, MD [facility] Pager [phone]  If 7PM-7AM, please contact night-coverage [email or web address] Password [email or web address] 02/20/2017, 5:08 PM

## 2017-02-20 NOTE — Care Management Obs Status (Signed)
[place] NOTIFICATION   Patient Details  Name: Jasmine Hood MRN: [id number] Date of Birth: [birth year]   Medicare Observation Status Notification Given:  Yes    Lynnell Catalan, RN 02/20/2017, 4:15 PM

## 2017-02-20 NOTE — Progress Notes (Signed)
CRITICAL VALUE ALERT  Critical Value:  Lactic acid 2.6  Date & Time Notied: 1884 02/20/2017  Provider Notified: Dr Grandville Silos Orders Received/Actions taken: No new orders

## 2017-02-20 NOTE — Progress Notes (Signed)
Pt from home with daughter. She declines HHPT at this time. Pt states she has a walker and is requesting a wheelchair for home. Order received and [facility] rep alerted. Marney Doctor RN,BSN,NCM [phone]

## 2017-02-20 NOTE — Progress Notes (Signed)
   02/20/17 1318  PT Time Calculation  PT Start Time (ACUTE ONLY) 1123  PT Stop Time (ACUTE ONLY) 1157  PT Time Calculation (min) (ACUTE ONLY) 34 min  PT G-Codes **NOT FOR INPATIENT CLASS**  Functional Assessment Tool Used AM-PAC 6 Clicks Basic Mobility;Clinical judgement  Functional Limitation Mobility: Walking and moving around  Mobility: Walking and Moving Around Current Status (Y6415) CJ  Mobility: Walking and Moving Around Goal Status (A3094) CI  PT General Charges  $$ ACUTE PT VISIT 1 Visit  PT Evaluation  $PT Eval Low Complexity 1 Low  PT Treatments  $Gait Training 8-22 mins   Weston Anna, MPT [phone]

## 2017-02-20 NOTE — Evaluation (Signed)
Physical Therapy Evaluation Patient Details Name: Jasmine Hood MRN: [id number] DOB: Jul 10, 1945 Today's Date: 02/20/2017   History of Present Illness  71 yo female admitted with AMS, weakness, falls. hx of breast cancer-on chemo, obesity, sleep apnea.   Clinical Impression  On eval, pt was Min guard-Min assist for mobility. She walked ~175 feet with a RW. O2 sat >90% on RA, dyspnea 2/4. No LOB with use of RW. Pt tolerated activity well. Recommend HHPT f/u, if pt is agreeable. Will follow during hospital stay.     Follow Up Recommendations Home health PT (if pt is agreeable);Supervision - Intermittent    Equipment Recommendations  None recommended by PT    Recommendations for Other Services       Precautions / Restrictions Precautions Precautions: Fall Restrictions Weight Bearing Restrictions: No      Mobility  Bed Mobility Overal bed mobility: Needs Assistance Bed Mobility: Supine to Sit     Supine to sit: Min assist;HOB elevated     General bed mobility comments: assist to scoot to EOB. Cues for technique. Increased time. Pt used bedrail.   Transfers Overall transfer level: Needs assistance Equipment used: Rolling walker (2 wheeled) Transfers: Sit to/from Stand Sit to Stand: Min guard         General transfer comment: close guard for safety. VCs safety, hand placement  Ambulation/Gait Ambulation/Gait assistance: Min guard Ambulation Distance (Feet): 175 Feet Assistive device: Rolling walker (2 wheeled) Gait Pattern/deviations: Step-through pattern;Decreased stride length     General Gait Details: close guard for safety. No LOB with use of RW. Pt tolerated distance well. O2 sat >90% on RA. Dyspnea 2/4  Stairs            Wheelchair Mobility    Modified Rankin (Stroke Patients Only)       Balance Overall balance assessment: Needs assistance;History of Falls         Standing balance support: Bilateral upper extremity supported Standing  balance-Leahy Scale: Poor Standing balance comment: requires RW                             Pertinent Vitals/Pain Pain Assessment: No/denies pain    Home Living Family/patient expects to be discharged to:: Private residence Living Arrangements: Children Available Help at Discharge: Family;Friend(s) Type of Home: Mobile home Home Access: Stairs to enter Entrance Stairs-Rails: Psychiatric nurse of Steps: 4 Home Layout: One level Home Equipment: Environmental consultant - 2 wheels;Walker - 4 wheels;Wheelchair - manual;Bedside commode      Prior Function Level of Independence: Needs assistance   Gait / Transfers Assistance Needed: Pt ambulating ltd distance with assist of dtr  ADL's / Homemaking Assistance Needed: daughter assists as needed        Hand Dominance        Extremity/Trunk Assessment   Upper Extremity Assessment Upper Extremity Assessment: Generalized weakness    Lower Extremity Assessment Lower Extremity Assessment: Generalized weakness    Cervical / Trunk Assessment Cervical / Trunk Assessment: Normal  Communication   Communication: No difficulties  Cognition Arousal/Alertness: Awake/alert Behavior During Therapy: WFL for tasks assessed/performed Overall Cognitive Status: Within Functional Limits for tasks assessed                                        General Comments      Exercises     Assessment/Plan  PT Assessment Patient needs continued PT services  PT Problem List Decreased strength;Decreased mobility;Decreased activity tolerance;Decreased balance;Decreased knowledge of use of DME       PT Treatment Interventions DME instruction;Gait training;Therapeutic activities;Therapeutic exercise;Patient/family education;Functional mobility training    PT Goals (Current goals can be found in the Care Plan section)  Acute Rehab PT Goals Patient Stated Goal: home! PT Goal Formulation: With patient Time For Goal  Achievement: 03/06/17 Potential to Achieve Goals: Good    Frequency Min 3X/week   Barriers to discharge        Co-evaluation               AM-PAC PT "6 Clicks" Daily Activity  Outcome Measure Difficulty turning over in bed (including adjusting bedclothes, sheets and blankets)?: A Lot Difficulty moving from lying on back to sitting on the side of the bed? : Unable Difficulty sitting down on and standing up from a chair with arms (e.g., wheelchair, bedside commode, etc,.)?: A Little Help needed moving to and from a bed to chair (including a wheelchair)?: A Little Help needed walking in hospital room?: A Little Help needed climbing 3-5 steps with a railing? : A Lot 6 Click Score: 14    End of Session Equipment Utilized During Treatment: Gait belt Activity Tolerance: Patient tolerated treatment well Patient left: in chair;with call bell/phone within reach;with chair alarm set   PT Visit Diagnosis: Muscle weakness (generalized) (M62.81);Difficulty in walking, not elsewhere classified (R26.2)    Time: 4742-5956 PT Time Calculation (min) (ACUTE ONLY): 34 min   Charges:   PT Evaluation $PT Eval Low Complexity: 1 Low PT Treatments $Gait Training: 8-22 mins   PT G Codes:          Weston Anna, MPT Pager: [phone]

## 2017-02-21 DIAGNOSIS — I10 Essential (primary) hypertension: Secondary | ICD-10-CM | POA: Diagnosis not present

## 2017-02-21 DIAGNOSIS — R531 Weakness: Secondary | ICD-10-CM | POA: Diagnosis not present

## 2017-02-21 DIAGNOSIS — N390 Urinary tract infection, site not specified: Secondary | ICD-10-CM | POA: Diagnosis not present

## 2017-02-21 DIAGNOSIS — E860 Dehydration: Secondary | ICD-10-CM | POA: Diagnosis not present

## 2017-02-21 LAB — CBC WITH DIFFERENTIAL/PLATELET
Basophils Absolute: 0 10*3/uL (ref 0.0–0.1)
Basophils Relative: 0 %
Eosinophils Absolute: 0 10*3/uL (ref 0.0–0.7)
Eosinophils Relative: 0 %
HEMATOCRIT: 30.2 % — AB (ref 36.0–46.0)
HEMOGLOBIN: 10.1 g/dL — AB (ref 12.0–15.0)
LYMPHS ABS: 0.3 10*3/uL — AB (ref 0.7–4.0)
Lymphocytes Relative: 4 %
MCH: 32.8 pg (ref 26.0–34.0)
MCHC: 33.4 g/dL (ref 30.0–36.0)
MCV: 98.1 fL (ref 78.0–100.0)
MONO ABS: 0 10*3/uL — AB (ref 0.1–1.0)
MONOS PCT: 0 %
NEUTROS ABS: 6.8 10*3/uL (ref 1.7–7.7)
NEUTROS PCT: 96 %
Platelets: 120 10*3/uL — ABNORMAL LOW (ref 150–400)
RBC: 3.08 MIL/uL — ABNORMAL LOW (ref 3.87–5.11)
RDW: 16.7 % — ABNORMAL HIGH (ref 11.5–15.5)
WBC: 7.1 10*3/uL (ref 4.0–10.5)

## 2017-02-21 LAB — BASIC METABOLIC PANEL
ANION GAP: 7 (ref 5–15)
BUN: 17 mg/dL (ref 6–20)
CHLORIDE: 108 mmol/L (ref 101–111)
CO2: 26 mmol/L (ref 22–32)
Calcium: 8.2 mg/dL — ABNORMAL LOW (ref 8.9–10.3)
Creatinine, Ser: 0.46 mg/dL (ref 0.44–1.00)
Glucose, Bld: 140 mg/dL — ABNORMAL HIGH (ref 65–99)
POTASSIUM: 4 mmol/L (ref 3.5–5.1)
SODIUM: 141 mmol/L (ref 135–145)

## 2017-02-21 MED ORDER — SORBITOL 70 % SOLN
30.0000 mL | Freq: Once | Status: AC
  Administered 2017-02-21: 30 mL via ORAL
  Filled 2017-02-21: qty 30

## 2017-02-21 MED ORDER — BISACODYL 10 MG RE SUPP
10.0000 mg | Freq: Once | RECTAL | Status: AC
  Administered 2017-02-21: 10 mg via RECTAL
  Filled 2017-02-21: qty 1

## 2017-02-21 MED ORDER — AMOXICILLIN-POT CLAVULANATE 875-125 MG PO TABS: 1.0000 | ORAL_TABLET | Freq: Two times a day (BID) | ORAL | 0 refills | Status: AC

## 2017-02-21 MED ORDER — HEPARIN SOD (PORK) LOCK FLUSH 100 UNIT/ML IV SOLN
500.0000 [IU] | INTRAVENOUS | Status: AC | PRN
  Administered 2017-02-21: 500 [IU]
  Filled 2017-02-21: qty 5

## 2017-02-21 MED ORDER — ONDANSETRON HCL 4 MG PO TABS: 4.0000 mg | ORAL_TABLET | Freq: Four times a day (QID) | ORAL | 0 refills | Status: DC | PRN

## 2017-02-21 MED ORDER — AMOXICILLIN-POT CLAVULANATE 875-125 MG PO TABS
1.0000 | ORAL_TABLET | Freq: Two times a day (BID) | ORAL | Status: DC
  Administered 2017-02-21: 1 via ORAL
  Filled 2017-02-21: qty 1

## 2017-02-21 NOTE — Evaluation (Signed)
Occupational Therapy Evaluation Patient Details Name: Jasmine Hood MRN: [id number] DOB: [birth year] Today's Date: 02/21/2017    History of Present Illness 71 yo female admitted with AMS, weakness, falls. hx of breast cancer-on chemo, obesity, sleep apnea.    Clinical Impression   Pt was admitted for the above.  Pt reports that she is usually independent with adls; she does have 60/6 at home.  Noted to have decreased visual attention during evaluation.  She will benefit from continued OT in acute setting and continued HHOT to maximize independence and safety. Goals are for min guard to supervision in acute setting    Follow Up Recommendations  Supervision/Assistance - 24 hour;Home health OT :[place]   Equipment Recommendations  None recommended by OT    Recommendations for Other Services       Precautions / Restrictions Precautions Precautions: Fall Restrictions Weight Bearing Restrictions: No      Mobility Bed Mobility               General bed mobility comments: oob  Transfers   Equipment used: Rolling walker (2 wheeled)   Sit to Stand: Supervision         General transfer comment: cues for UE placement    Balance                                           ADL either performed or assessed with clinical judgement   ADL Overall ADL's : Needs assistance/impaired Eating/Feeding: Independent   Grooming: Oral care;Supervision/safety;Standing   Upper Body Bathing: Set up;Sitting   Lower Body Bathing: Minimal assistance;Sit to/from stand   Upper Body Dressing : Minimal assistance;Sitting (IV/port)   Lower Body Dressing: Minimal assistance;Sit to/from stand   Toilet Transfer: Min guard;Ambulation;BSC;RW   Toileting- Clothing Manipulation and Hygiene: Moderate assistance;Sit to/from stand         General ADL Comments: ambulated to bathroom with cues for walking around obstacles     Vision   Additional Comments: decreased visual  attention      Perception     Praxis      Pertinent Vitals/Pain Pain Assessment: No/denies pain     Hand Dominance     Extremity/Trunk Assessment Upper Extremity Assessment Upper Extremity Assessment: Generalized weakness           Communication Communication Communication: No difficulties   Cognition Arousal/Alertness: Awake/alert Behavior During Therapy: WFL for tasks assessed/performed Overall Cognitive Status: No family/caregiver present to determine baseline cognitive functioning                                 General Comments: mostly wfls; needed cues to find toothbrush and paste on L; cues to work around obstacles in Scientist, product/process development Comments       Exercises     Shoulder Instructions      Home Living Family/patient expects to be discharged to:: Private residence Living Arrangements: Children Available Help at Discharge: Family;Friend(s)               Bathroom Shower/Tub: Occupational psychologist: Standard     Home Equipment: Bedside commode;Hand held shower head          Prior Functioning/Environment      ADL's / Homemaking Assistance Needed: daughter assists as needed   Comments: pt states she  can usually do self care alone        OT Problem List: Decreased strength;Decreased activity tolerance;Impaired balance (sitting and/or standing);Decreased safety awareness (visual attention)      OT Treatment/Interventions: Self-care/ADL training;Energy conservation;DME and/or AE instruction;Balance training;Patient/family education    OT Goals(Current goals can be found in the care plan section) Acute Rehab OT Goals Patient Stated Goal: home! OT Goal Formulation: With patient Time For Goal Achievement: 02/28/17 Potential to Achieve Goals: Good ADL Goals Pt Will Transfer to Toilet: with supervision;ambulating;bedside commode Pt Will Perform Toileting - Clothing Manipulation and hygiene: with min assist;sit  to/from stand Additional ADL Goal #1: pt will gather clothes at min guard level and complete adl with supervision  OT Frequency: Min 2X/week   Barriers to D/C:            Co-evaluation              AM-PAC PT "6 Clicks" Daily Activity     Outcome Measure Help from another person eating meals?: None Help from another person taking care of personal grooming?: A Little Help from another person toileting, which includes using toliet, bedpan, or urinal?: A Lot Help from another person bathing (including washing, rinsing, drying)?: A Little Help from another person to put on and taking off regular upper body clothing?: A Little Help from another person to put on and taking off regular lower body clothing?: A Little 6 Click Score: 18   End of Session    Activity Tolerance: Patient tolerated treatment well Patient left: in chair;with call bell/phone within reach;with chair alarm set  OT Visit Diagnosis: Unsteadiness on feet (R26.81);Muscle weakness (generalized) (M62.81)                Time: 6295-2841 OT Time Calculation (min): 21 min Charges:  OT General Charges $OT Visit: 1 Visit OT Evaluation $OT Eval Low Complexity: 1 Low G-Codes: OT G-codes **NOT FOR INPATIENT CLASS** Functional Assessment Tool Used: Clinical judgement Functional Limitation: Self care Self Care Current Status (L2440): At least 40 percent but less than 60 percent impaired, limited or restricted Self Care Goal Status (N0272): At least 20 percent but less than 40 percent impaired, limited or restricted   Lesle Chris, OTR/L [phone] 02/21/2017  Amareon Phung 02/21/2017, 10:18 AM

## 2017-02-21 NOTE — Progress Notes (Signed)
Enema given with medium formed bm noted

## 2017-02-21 NOTE — Progress Notes (Signed)
Discharge instructions reviewed with patient. Patient and sister verbalized understanding. Patient discharged via private vehicle

## 2017-02-21 NOTE — Discharge Summary (Signed)
Physician Discharge Summary  Jasmine Hood HEN:[id number] DOB: 1945-06-19 DOA: 02/19/2017  PCP: Marletta Lor, MD  Admit date: 02/19/2017 Discharge date: 02/21/2017  Time spent: 55 minutes  Recommendations for Outpatient Follow-up:  1. Follow-up with Dr.Magrinat, hematology/oncology 1 week. 2. Follow-up with Marletta Lor, MD in 2 weeks.   Discharge Diagnoses:  Principal Problem:   Generalized weakness Active Problems:   Acute lower UTI   Hypothyroidism   Essential hypertension   Malignant neoplasm of overlapping sites of right breast in female, estrogen receptor positive ([place])   Morbid obesity with BMI of 50.0-59.9, adult (HCC)   Dehydration   Elevated lactic acid level   Urinary tract infection without hematuria   Weakness   Discharge Condition: Stable and improved  Diet recommendation: Regular  Filed Weights   02/19/17 0605 02/19/17 1100  Weight: 103.9 kg (229 lb) 103.1 kg (227 lb 4.7 oz)    History of present illness:  Per Dr Darrick Meigs Jasmine Hood  is a 71 y.o. female, With a history of breast cancer, with ongoing chemotherapy, recent UTI treated with by mouth Vantin. Patient came to hospital with generalized weakness and fatigue. Also patient was found to be mildly confused. Patient received chemotherapy 1 day prior to admission.  She fell off her recliner the day prior to admission. Patient denied chest pain, no shortness of breath. No nausea vomiting or diarrhea. No fever or chills. She complained of intermittent dysuria. Her appetite is good. She complained of no energy.  In the ED she was again found to have abnormal UA with lactic acid 2.61. She was empirically started on ceftriaxone. Urine culture was obtained. Patient was recently treated for UTI on 02/12/2017. She was discharged home on by mouth Vantin. At that time urine culture grew Klebsiella pneumoniae sensitive to cephalosporins.   Hospital Course:  #1 generalized weakness Questionable  etiology. Likely secondary to chemotherapy in the setting of probable UTI. Patient has been pancultured with no growth to date. Patient was noted to have elevated lactic acid level on admission. Patient was placed on IV fluids, supportive care, empiric IV Rocephin due to concerns for UTI. Patient was seen by PT/OT and refused home health therapies. Patient improved clinically and subsequently transitioned to oral Augmentin. Patient be discharged home on 5 more days of oral Augmentin to complete a course of antibiotic treatment. Outpatient follow-up.   #2 UTI Urinalysis on admission was worrisome for UTI and due to patient's presentation with generalized weakness patient was pancultured and placed empirically on IV Rocephin. Urine cultures were negative. Blood cultures were no growth to date during the hospitalization. Due to patient's immunocompromised state patient be discharged home on 5 more days of oral Augmentin to complete a course of antibiotic treatment.   #3 hypertension Blood pressure improved and remained stable.  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 Continued on Decadron 2 days post chemotherapy as directed. Patient was continued on home regimen of Diflucan, Valtrex. Outpatient follow-up with oncology.   #5 ?? hypothyroidism TSH was 2.129 02/12/2017. Patient not on Synthroid. Outpatient follow-up.   Procedures:  CT head CT C-spine 02/19/2017  Chest x-ray 02/19/2017   Consultations:  None  Discharge Exam: Vitals:   02/20/17 2145 02/21/17 0505  BP: (!) 155/80 (!) 145/68  Pulse: 68 (!) 59  Resp: 20 20  Temp: 97.7 F (36.5 C) 97.8 F (36.6 C)  SpO2: 96% 99%    General: NAD  Cardiovascular: RRR Respiratory: CTAB  Discharge Instructions   Discharge Instructions    Diet general    Complete by:  As directed    Increase activity slowly    Complete by:  As directed       Current Discharge Medication List    START taking these medications   Details  amoxicillin-clavulanate (AUGMENTIN) 875-125 MG tablet Take 1 tablet by mouth every 12 (twelve) hours. Take for 5 days then stop. Qty: 10 tablet, Refills: 0    ondansetron (ZOFRAN) 4 MG tablet Take 1 tablet (4 mg total) by mouth every 6 (six) hours as needed for nausea. Qty: 20 tablet, Refills: 0      CONTINUE these medications which have NOT CHANGED   Details  dexamethasone (DECADRON) 4 MG tablet Take 2 tablets (8 mg total) by mouth daily. Start the day after chemotherapy for 2 days. Take with food. Qty: 30 tablet, Refills: 1   Associated Diagnoses: Malignant neoplasm of overlapping sites of right breast in female, estrogen receptor positive (HCC)    EPINEPHrine (EPIPEN 2-PAK) 0.3 mg/0.3 mL SOAJ injection Inject 0.3 mLs (0.3 mg total) into the muscle once. Qty: 1 Device, Refills: 2    fluconazole (DIFLUCAN) 100 MG tablet Take 2 tablets (200 mg total) by mouth daily. Take for 10 days following treatment, every 21 days Qty: 70 tablet, Refills: 1   Associated Diagnoses: Malignant neoplasm of overlapping sites of right breast in female, estrogen receptor positive (HCC)    lidocaine-prilocaine (EMLA) cream Apply 1 application topically as needed. Qty: 30 g, Refills: 0    magic mouthwash w/lidocaine SOLN Take 5 mLs by mouth 4 (four) times daily as needed for mouth pain. Qty: 240 mL, Refills: 0   Associated Diagnoses: Malignant neoplasm of overlapping sites of right breast in female, estrogen receptor positive (HCC)    promethazine (PHENERGAN) 25 MG suppository Place 1 suppository (25 mg total) rectally every 6 (six) hours as needed for nausea or vomiting. Qty: 12 each, Refills: 4    Skin Protectants, Misc. (ULTRA-DERM EX) Apply 1 application topically 3 (three) times daily as needed. Redness    triamcinolone ointment (KENALOG) 0.5 % Apply 1 application topically 2 (two) times daily. Qty: 30 g, Refills: 0     valACYclovir (VALTREX) 500 MG tablet Take 500 mg by mouth daily.        Allergies  Allergen Reactions  . Bee Venom Anaphylaxis   Follow-up Information    Magrinat, Virgie Dad, MD. Schedule an appointment as soon as possible for a visit in 1 week(s).   Specialty:  Oncology Why:  f/u in 1-2 weeks. Contact information: [place] [address] [phone]        Marletta Lor, MD. Schedule an appointment as soon as possible for a visit in 2 week(s).   Specialty:  Internal Medicine Contact information: [place] [address] [phone]            The results of significant diagnostics from this hospitalization (including imaging, microbiology, ancillary and laboratory) are listed below for reference.    Significant Diagnostic Studies: Dg Chest 2 View  Result Date: 02/12/2017 CLINICAL DATA:  71 y/o F; week with altered mental status. Metastatic breast cancer. EXAM: CHEST  2 VIEW COMPARISON:  10/29/2016 chest radiograph FINDINGS: Left port catheter tip projects over lower SVC. Mild S-shaped curvature of the spine. No acute osseous abnormality is evident. Clear lungs. No pleural effusion or pneumothorax. Stable normal cardiac silhouette. IMPRESSION:  No acute pulmonary process identified. Electronically Signed   By: Kristine Garbe M.D.   On: 02/12/2017 02:27   Ct Head Wo Contrast  Result Date: 02/19/2017 CLINICAL DATA:  [place]. Found sitting in floor, slid from recliner. Advanced breast cancer. EXAM: CT HEAD WITHOUT CONTRAST CT CERVICAL SPINE WITHOUT CONTRAST TECHNIQUE: Multidetector CT imaging of the head and cervical spine was performed following the standard protocol without intravenous contrast. Multiplanar CT image reconstructions of the cervical spine were also generated. COMPARISON:  MRI of the cervicothoracic and lumbar spine 11/11/2016. CT head 11/23/2004. FINDINGS: CT HEAD FINDINGS Brain: No evidence for acute  infarction, hemorrhage, mass lesion, hydrocephalus, or extra-axial fluid. Moderate atrophy. Hypoattenuation of white matter likely small vessel disease. Vascular: No hyperdense vessel or unexpected calcification. Skull: Hyperostosis frontalis interna. No fracture. No definite metastatic lesions. Sinuses/Orbits: Clear. Other: None. CT CERVICAL SPINE FINDINGS Alignment: Anatomic. Skull base and vertebrae: No posttraumatic fracture. Metastatic disease at C6, C7, and the thoracic spine is better visualized on MR. No pathologic fracture is evident. Soft tissues and spinal canal: No prevertebral fluid or swelling. No visible canal hematoma. Atherosclerosis. Disc levels:  Spondylosis with disc space narrowing at C5-6. Upper chest: No mass or pneumothorax.  Port-A-Cath. Other: None. IMPRESSION: No skull fracture or intracranial hemorrhage. No cervical spine fracture or traumatic subluxation. Osseous metastatic disease at C6 and below is better visualized on MR. There is no pathologic fracture on today's exam or visible intraspinal tumor/hematoma. Electronically Signed   By: Staci Righter M.D.   On: 02/19/2017 07:16   Ct Cervical Spine Wo Contrast  Result Date: 02/19/2017 CLINICAL DATA:  [place]. Found sitting in floor, slid from recliner. Advanced breast cancer. EXAM: CT HEAD WITHOUT CONTRAST CT CERVICAL SPINE WITHOUT CONTRAST TECHNIQUE: Multidetector CT imaging of the head and cervical spine was performed following the standard protocol without intravenous contrast. Multiplanar CT image reconstructions of the cervical spine were also generated. COMPARISON:  MRI of the cervicothoracic and lumbar spine 11/11/2016. CT head 11/23/2004. FINDINGS: CT HEAD FINDINGS Brain: No evidence for acute infarction, hemorrhage, mass lesion, hydrocephalus, or extra-axial fluid. Moderate atrophy. Hypoattenuation of white matter likely small vessel disease. Vascular: No hyperdense vessel or unexpected calcification. Skull: Hyperostosis  frontalis interna. No fracture. No definite metastatic lesions. Sinuses/Orbits: Clear. Other: None. CT CERVICAL SPINE FINDINGS Alignment: Anatomic. Skull base and vertebrae: No posttraumatic fracture. Metastatic disease at C6, C7, and the thoracic spine is better visualized on MR. No pathologic fracture is evident. Soft tissues and spinal canal: No prevertebral fluid or swelling. No visible canal hematoma. Atherosclerosis. Disc levels:  Spondylosis with disc space narrowing at C5-6. Upper chest: No mass or pneumothorax.  Port-A-Cath. Other: None. IMPRESSION: No skull fracture or intracranial hemorrhage. No cervical spine fracture or traumatic subluxation. Osseous metastatic disease at C6 and below is better visualized on MR. There is no pathologic fracture on today's exam or visible intraspinal tumor/hematoma. Electronically Signed   By: Staci Righter M.D.   On: 02/19/2017 07:16   Dg Chest Port 1 View  Result Date: 02/19/2017 CLINICAL DATA:  Per family, increased weakness, altered mental status over last few days, unwitnessed fall at home tonight EXAM: PORTABLE CHEST 1 VIEW COMPARISON:  02/12/2017 FINDINGS: Central venous catheter with tip over the cavoatrial junction region. No pneumothorax. Shallow inspiration. Heart size and pulmonary vascularity are normal. Lungs are clear and expanded. No blunting of costophrenic angles. No pneumothorax. Tortuous aorta. Degenerative changes in the spine. IMPRESSION: No evidence of active pulmonary disease. Electronically Signed  By: Lucienne Capers M.D.   On: 02/19/2017 06:40    Microbiology: Recent Results (from the past 240 hour(s))  Culture, Urine     Status: Abnormal   Collection Time: 02/12/17  6:00 AM  Result Value Ref Range Status   Specimen Description URINE, RANDOM  Final   Special Requests Immunocompromised  Final   Culture >=100,000 COLONIES/mL KLEBSIELLA PNEUMONIAE (A)  Final   Report Status 02/15/2017 FINAL  Final   Organism ID, Bacteria  KLEBSIELLA PNEUMONIAE (A)  Final      Susceptibility   Klebsiella pneumoniae - MIC*    AMPICILLIN >=32 RESISTANT Resistant     CEFAZOLIN <=4 SENSITIVE Sensitive     CEFTRIAXONE <=1 SENSITIVE Sensitive     CIPROFLOXACIN <=0.25 SENSITIVE Sensitive     GENTAMICIN <=1 SENSITIVE Sensitive     IMIPENEM <=0.25 SENSITIVE Sensitive     NITROFURANTOIN 128 RESISTANT Resistant     TRIMETH/SULFA <=20 SENSITIVE Sensitive     AMPICILLIN/SULBACTAM 8 SENSITIVE Sensitive     PIP/TAZO <=4 SENSITIVE Sensitive     Extended ESBL NEGATIVE Sensitive     * >=100,000 COLONIES/mL KLEBSIELLA PNEUMONIAE  Culture, blood (Routine x 2)     Status: None (Preliminary result)   Collection Time: 02/19/17  5:48 AM  Result Value Ref Range Status   Specimen Description BLOOD PORTA CATH  Final   Special Requests   Final    BOTTLES DRAWN AEROBIC AND ANAEROBIC Blood Culture adequate volume   Culture   Final    NO GROWTH 1 DAY Performed at [facility], [address], [place], [place] [address]    Report Status PENDING  Incomplete  Culture, blood (Routine x 2)     Status: None (Preliminary result)   Collection Time: 02/19/17  5:53 AM  Result Value Ref Range Status   Specimen Description BLOOD LEFT ANTECUBITAL  Final   Special Requests IN PEDIATRIC BOTTLE Blood Culture adequate volume  Final   Culture   Final    NO GROWTH 1 DAY Performed at [facility], [place] [address] [place], [place] [address]    Report Status PENDING  Incomplete  Urine culture     Status: None   Collection Time: 02/19/17  6:01 AM  Result Value Ref Range Status   Specimen Description URINE, RANDOM  Final   Special Requests NONE  Final   Culture   Final    NO GROWTH Performed at [facility], [address], [place], [place] [address]    Report Status 02/20/2017 FINAL  Final     Labs: Basic Metabolic Panel:  Recent Labs Lab 02/18/17 1021 02/19/17 0548 02/19/17 1145 02/20/17 0540 02/21/17 0500  NA 137 132*  --  139  141  K 3.9 3.7  --  3.9 4.0  CL  --  101  --  106 108  CO2 24 22  --  25 26  GLUCOSE 127 218*  --  162* 140*  BUN 12.2 16  --  17 17  CREATININE 0.7 0.65 0.53 0.59 0.46  CALCIUM 9.6 8.7*  --  8.2* 8.2*   Liver Function Tests:  Recent Labs Lab 02/18/17 1021 02/19/17 0548 02/20/17 0540  AST 43* 41 46*  ALT 36 33 36  ALKPHOS 169* 155* 152*  BILITOT 0.56 0.4 <0.1*  PROT 6.6 6.2* 5.6*  ALBUMIN 2.0* 2.2* 2.1*   No results for input(s): LIPASE, AMYLASE in the last 168 hours. No results for input(s): AMMONIA in  the last 168 hours. CBC:  Recent Labs Lab 02/18/17 1021 02/19/17 0548 02/19/17 1145 02/20/17 0540 02/21/17 0500  WBC 6.7 9.0 10.8* 7.7 7.1  NEUTROABS 5.4 8.1*  --   --  6.8  HGB 11.9 11.0* 10.7* 9.9* 10.1*  HCT 35.7 32.7* 31.4* 30.2* 30.2*  MCV 98.1 95.6 95.2 97.4 98.1  PLT 149 133* 134* 124* 120*   Cardiac Enzymes: No results for input(s): CKTOTAL, CKMB, CKMBINDEX, TROPONINI in the last 168 hours. BNP: BNP (last 3 results) No results for input(s): BNP in the last 8760 hours.  ProBNP (last 3 results) No results for input(s): PROBNP in the last 8760 hours.  CBG:  Recent Labs Lab 02/19/17 0612  GLUCAP 186*       Signed:  Leon Goodnow MD.  [facility] 02/21/2017, 2:13 PM

## 2017-02-24 ENCOUNTER — Telehealth: Payer: Self-pay | Admitting: Oncology

## 2017-02-24 ENCOUNTER — Other Ambulatory Visit: Payer: Self-pay | Admitting: Oncology

## 2017-02-24 ENCOUNTER — Telehealth: Payer: Self-pay | Admitting: *Deleted

## 2017-02-24 LAB — CULTURE, BLOOD (ROUTINE X 2)
Culture: NO GROWTH
Culture: NO GROWTH
Special Requests: ADEQUATE
Special Requests: ADEQUATE

## 2017-02-24 NOTE — Telephone Encounter (Signed)
Transition Care Management Follow-up Telephone Call  Per Discharge Summary:  Admit date: 02/19/2017 Discharge date: 02/21/2017  Recommendations for Outpatient Follow-up:  1. Follow-up with Dr.Magrinat, hematology/oncology 1 week. 2. Follow-up with Marletta Lor, MD in 2 weeks.   Discharge Diagnoses:  Principal Problem:   Generalized weakness Active Problems:   Acute lower UTI   Hypothyroidism   Essential hypertension   Malignant neoplasm of overlapping sites of right breast in female, estrogen receptor positive ([place])   Morbid obesity with BMI of 50.0-59.9, adult (HCC)   Dehydration   Elevated lactic acid level   Urinary tract infection without hematuria   Weakness   Discharge Condition: Stable and improved  Diet recommendation: Regular  --   How have you been since you were released from the hospital? "I'm okay."   Do you understand why you were in the hospital? yes   Do you understand the discharge instructions? yes   Where were you discharged to? Home   Items Reviewed:  Medications reviewed: no  Allergies reviewed: yes  Dietary changes reviewed: yes  Referrals reviewed: no, none made per patient   Functional Questionnaire:   Activities of Daily Living (ADLs):   She states they are independent in the following: feeding, continence and grooming States they require assistance with the following: ambulation, bathing and hygiene, toileting and dressing. Daughter is helping her with ADLs.   Any transportation issues/concerns?: no   Any patient concerns? no   Confirmed importance and date/time of follow-up visits scheduled yes  Provider Appointment booked with Dr. Bluford Kaufmann 03/04/17 @ 2:30pm  Confirmed with patient if condition begins to worsen call PCP or go to the ER.  Patient was given the office number and encouraged to call back with question or concerns.  : yes

## 2017-02-24 NOTE — Telephone Encounter (Signed)
Scheduled appt per patient request to f/u after leaving the hospital. - patient is aware of appt time and date.

## 2017-02-26 ENCOUNTER — Ambulatory Visit (HOSPITAL_BASED_OUTPATIENT_CLINIC_OR_DEPARTMENT_OTHER): Payer: Medicare Other | Admitting: Adult Health

## 2017-02-26 ENCOUNTER — Telehealth: Payer: Self-pay | Admitting: Oncology

## 2017-02-26 VITALS — BP 154/85 | HR 117 | Temp 97.9°F | Resp 21 | Ht 59.0 in | Wt 232.6 lb

## 2017-02-26 DIAGNOSIS — C50811 Malignant neoplasm of overlapping sites of right female breast: Secondary | ICD-10-CM

## 2017-02-26 DIAGNOSIS — Z85038 Personal history of other malignant neoplasm of large intestine: Secondary | ICD-10-CM

## 2017-02-26 DIAGNOSIS — C7951 Secondary malignant neoplasm of bone: Secondary | ICD-10-CM

## 2017-02-26 DIAGNOSIS — Z170 Estrogen receptor positive status [ER+]: Secondary | ICD-10-CM

## 2017-02-26 DIAGNOSIS — Z08 Encounter for follow-up examination after completed treatment for malignant neoplasm: Secondary | ICD-10-CM

## 2017-02-26 NOTE — Patient Instructions (Signed)

## 2017-02-26 NOTE — Telephone Encounter (Signed)
No answer - scheduled appt per 10/8 sch message - sent reminder letter in the mail.

## 2017-02-26 NOTE — Progress Notes (Addendum)
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  She has been admitted to the hopsital twice in the past two weeks.  The first time was due to confusion around the end of September due to a Klebsiella UTI.  The second time was due to weakness and fatigue.  She was discharged about 5 days ago from the hospital.  As she has received 6 cycles of CMF, due to these repeated hospitalizations, she will stop chemotherapy, and proceed with MRI and surgical planning.    REVIEW OF SYSTEMS: Jasmine Hood is feeling much better since her most recent hospitalization.  She is finishing up taking Augmentin.  She is taking Letrozole and tolerating it well.  Her daughter continues to take care of her.  Her last set of blood cultures were negative.  A detailed ros is otherwise non contributory.     BREAST CANCER HISTORY: From the original intake note  Jasmine Hood herself noted a change in her right breast sometime in September 2017. She did not immediately bring it to medical attention. When she noted some significant changes in her right nipple she saw Dr. Amador Cunas and was set up for bilateral diagnostic mammography with tomography and right breast [facility] Oct [facility] 2016. This found the breast density to be  category B. In the upper and lower outer quadrants of the right breast there was a mass measuring at least 10 cm. There were also groups of heterogeneous calcifications measuring 3 cm and a separate group 0.4 cm. On physical exam there was a palpable firm mass measuring approximately 10 cm involving the upper outer and lower outer quadrant of the right breast, with skin reaction and nipple retraction. There was no palpable right axillary adenopathy.  Right breast ultrasonography confirmed a large hypoechoic mass with irregular margins extending to the skin surface. The right axilla showed 2 lymph nodes which appeared abnormal.  In the left breast there were some indeterminate retroareolar calcifications which were felt to warrant biopsy. This was performed 10/08/2016 and showed a fibroadenoma ([id number]).  Biopsy of the right breast mass at the 7:00 and 10:00 positions as well as one of the suspicious lymph nodes all showed invasive ductal carcinoma, E-cadherin positive. Separate prognostic profiles from the 2 breast masses were sent. Estrogen receptor was positive at 95-70%, progesterone receptor was positive at 15-85%, all with strong staining intensity, the MIB-1 was 15-20%, and HER-2 was nonamplified, the signals ratio being 1.28-1.72 and the number per cell 1.85-3.95.  The patient's subsequent history is as detailed below   PAST MEDICAL HISTORY: Past Medical History:  Diagnosis Date  . Breast cancer (HCC) 10/07/2016   bilateral breast   . Manson Passey recluse spider bite    right leg  . COLONIC POLYPS, HX OF 01/05/2009   Qualifier: Diagnosis of  By: Amador Cunas  MD, Janett Labella   . Complication of  anesthesia    slow to awaken after wisdom  teeth extraction age 74  . Genetic testing 11/27/2016   Ms. Kulkarni underwent genetic counseling and testing for hereditary cancer syndromes on 11/19/2016. Her results were negative for mutations in all 46 genes analyzed by [facility]'s 46-gene Common Hereditary  Cancers Panel. Genes analyzed include: APC, ATM, AXIN2, BARD1, BMPR1A, BRCA1, BRCA2, BRIP1, CDH1, CDKN2A, CHEK2, CTNNA1, DICER1, EPCAM, GREM1, HOXB13, KIT, MEN1, MLH1, MSH2, MSH3, MSH6, MUTYH, NBN,  . HYPOTHYROIDISM 02/01/2010   Qualifier: Diagnosis of  By: [name]  MD, [name]   . Morbid obesity ([place]) 01/05/2009   Qualifier: Diagnosis of  By: [name]  MD, [name]   . Pneumonia 1987  . Sleep apnea    no cpap used     PAST SURGICAL HISTORY: Past Surgical History:  Procedure Laterality Date  . BREAST BIOPSY Right 10/07/2016   invasive mammary carcinoma  . BREAST BIOPSY Left 10/08/2016   left  breast fibroadenoma no malignancy  . CATARACT EXTRACTION Bilateral   . COLONOSCOPY WITH PROPOFOL N/A 12/22/2015   Procedure: COLONOSCOPY WITH PROPOFOL;  Surgeon: [name], MD;  Location: [facility];  Service: Endoscopy;  Laterality: N/A;  . colonscopy  2012   with polyp removed  . MOLE REMOVAL  10/29/2016   Procedure: MOLE REMOVAL LEFT CHEST;  Surgeon: Alphonsa Overall, MD;  Location: [facility];  Service: General;;  . PORTACATH PLACEMENT N/A 10/29/2016   Procedure: INSERTION PORT-A-CATH;  Surgeon: Alphonsa Overall, MD;  Location: [facility];  Service: General;  Laterality: N/A;  . surgery for spider bite  1998  . TUBAL LIGATION    . WISDOM TOOTH EXTRACTION  age 48    FAMILY HISTORY Family History  Problem Relation Age of Onset  . Breast cancer Mother 28       d.52 from metastatic disease  . Throat cancer Father        d.65 history of smoking  . Rheum arthritis Sister        3 sisters pos. for osteo and RA  . Diabetes Maternal Aunt   . Cancer Maternal Grandfather        mouth cancer-chewed tobacco  . Breast cancer Cousin 67  . Breast cancer Daughter 4  . Stomach cancer Sister 76  The patient's father died at the age of 68 from laryngeal cancer in the setting of tobacco abuse. The patient's mother died at the age of 48 from metastatic breast cancer which had been diagnosed in her  early 58s. The patient has 2 half-sisters and one half-brother. One of her sisters has "stomach" cancer. There is also a cousin on the maternal side with breast cancer diagnosed at age 67. No family member has been genetically tested yet  GYNECOLOGIC HISTORY:  No LMP recorded. Patient is postmenopausal. Menarche age 3, first live birth age 56, she is Jasmine Hood. She is status post bilateral tubal ligation. She stopped having periods in 1985. She tells me she did not use hormone replacement. However a note from her bone density scan December 2001 states "this patient began hormone replacement therapy 3 months ago"  SOCIAL HISTORY:  She is originally from [place]. 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 She has survived 2 husband's period at home currently is just she and her daughter Jasmine Hood. She tells me this daughter had mild brain damage at birth and is not working, and cannot drive because of a history of seizures. However she is the one  she is planning to name is her healthcare power of attorney. The patient has 13 grandchildren and 4 great-grandchildren. She attends a [employer]: At the 10/21/2016 visit the patient was given the appropriate documents to complete and notarize at her discretion   HEALTH MAINTENANCE: Social History  Substance Use Topics  . Smoking status: Never Smoker  . Smokeless tobacco: Never Used  . Alcohol use No     Colonoscopy: 12/22/2015/Nandigam  PAP:  Bone density: December 2001 was normal   Allergies  Allergen Reactions  . Bee Venom Anaphylaxis    Current Outpatient Prescriptions  Medication Sig Dispense Refill  . amoxicillin-clavulanate (AUGMENTIN) 875-125 MG tablet Take 1 tablet by mouth every 12 (twelve) hours. Take for 5 days then stop. 10 tablet 0  . dexamethasone (DECADRON) 4 MG tablet Take 2 tablets (8 mg total) by mouth daily. Start the day after chemotherapy for 2 days. Take with food. 30  tablet 1  . EPINEPHrine (EPIPEN 2-PAK) 0.3 mg/0.3 mL SOAJ injection Inject 0.3 mLs (0.3 mg total) into the muscle once. (Patient taking differently: Inject 0.3 mg into the muscle daily as needed. Allergic Reaction) 1 Device 2  . fluconazole (DIFLUCAN) 100 MG tablet Take 2 tablets (200 mg total) by mouth daily. Take for 10 days following treatment, every 21 days (Patient taking differently: Take 100 mg by mouth daily. Take for 5 days following treatment, every 21 days) 70 tablet 1  . lidocaine-prilocaine (EMLA) cream Apply 1 application topically as needed. (Patient taking differently: Apply 1 application topically daily as needed. Port Access) 30 g 0  . magic mouthwash w/lidocaine SOLN Take 5 mLs by mouth 4 (four) times daily as needed for mouth pain. 240 mL 0  . ondansetron (ZOFRAN) 4 MG tablet Take 1 tablet (4 mg total) by mouth every 6 (six) hours as needed for nausea. 20 tablet 0  . promethazine (PHENERGAN) 25 MG suppository Place 1 suppository (25 mg total) rectally every 6 (six) hours as needed for nausea or vomiting. 12 each 4  . Skin Protectants, Misc. (ULTRA-DERM EX) Apply 1 application topically 3 (three) times daily as needed. Redness    . triamcinolone ointment (KENALOG) 0.5 % Apply 1 application topically 2 (two) times daily. 30 g 0  . valACYclovir (VALTREX) 500 MG tablet Take 500 mg by mouth daily.      No current facility-administered medications for this visit.     OBJECTIVE:   Vitals:   02/26/17 1201  BP: (!) 154/85  Pulse: (!) 117  Resp: (!) 21  Temp: 97.9 F (36.6 C)  SpO2: 99%     Body mass index is 46.98 kg/m.    ECOG FS:1 - Symptomatic but completely ambulatory  GENERAL: Patient is a chronically ill appearing older obesefemale in no acute distress HEENT:  Sclerae anicteric.  Oropharynx clear and moist. No ulcerations or evidence of oropharyngeal candidiasis. Neck is supple.  NODES:  No cervical, supraclavicular, or axillary lymphadenopathy palpated.  BREAST EXAM:   Deferred. Did look underneath left breast, was a previous "split" in the skin that has healed very well. LUNGS:  Clear to auscultation bilaterally.  No wheezes or rhonchi. HEART:  Regular rate and rhythm. No murmur appreciated. ABDOMEN:  Soft, nontender.  Positive, normoactive bowel sounds. No organomegaly palpated. MSK:  No focal spinal tenderness to palpation. Full range of motion bilaterally in the upper extremities. EXTREMITIES:  No peripheral edema.   SKIN:  Clear with no obvious rashes or skin changes.  No nail dyscrasia. NEURO:  Nonfocal. Well oriented.  Appropriate affect.      LAB RESULTS:  CMP     Component Value Date/Time   NA 141 02/21/2017 0500   NA 137 02/18/2017 1021   K 4.0 02/21/2017 0500   K 3.9 02/18/2017 1021   CL 108 02/21/2017 0500   CO2 26 02/21/2017 0500   CO2 24 02/18/2017 1021   GLUCOSE 140 (H) 02/21/2017 0500   GLUCOSE 127 02/18/2017 1021   BUN 17 02/21/2017 0500   BUN 12.2 02/18/2017 1021   CREATININE 0.46 02/21/2017 0500   CREATININE 0.7 02/18/2017 1021   CALCIUM 8.2 (L) 02/21/2017 0500   CALCIUM 9.6 02/18/2017 1021   PROT 5.6 (L) 02/20/2017 0540   PROT 6.6 02/18/2017 1021   ALBUMIN 2.1 (L) 02/20/2017 0540   ALBUMIN 2.0 (L) 02/18/2017 1021   AST 46 (H) 02/20/2017 0540   AST 43 (H) 02/18/2017 1021   ALT 36 02/20/2017 0540   ALT 36 02/18/2017 1021   ALKPHOS 152 (H) 02/20/2017 0540   ALKPHOS 169 (H) 02/18/2017 1021   BILITOT <0.1 (L) 02/20/2017 0540   BILITOT 0.56 02/18/2017 1021   GFRNONAA >60 02/21/2017 0500   GFRAA >60 02/21/2017 0500    No results found for: TOTALPROTELP, ALBUMINELP, A1GS, A2GS, BETS, BETA2SER, GAMS, MSPIKE, SPEI  No results found for: KPAFRELGTCHN, LAMBDASER, KAPLAMBRATIO  Lab Results  Component Value Date   WBC 7.1 02/21/2017   NEUTROABS 6.8 02/21/2017   HGB 10.1 (L) 02/21/2017   HCT 30.2 (L) 02/21/2017   MCV 98.1 02/21/2017   PLT 120 (L) 02/21/2017      Chemistry      Component Value Date/Time   NA 141  02/21/2017 0500   NA 137 02/18/2017 1021   K 4.0 02/21/2017 0500   K 3.9 02/18/2017 1021   CL 108 02/21/2017 0500   CO2 26 02/21/2017 0500   CO2 24 02/18/2017 1021   BUN 17 02/21/2017 0500   BUN 12.2 02/18/2017 1021   CREATININE 0.46 02/21/2017 0500   CREATININE 0.7 02/18/2017 1021      Component Value Date/Time   CALCIUM 8.2 (L) 02/21/2017 0500   CALCIUM 9.6 02/18/2017 1021   ALKPHOS 152 (H) 02/20/2017 0540   ALKPHOS 169 (H) 02/18/2017 1021   AST 46 (H) 02/20/2017 0540   AST 43 (H) 02/18/2017 1021   ALT 36 02/20/2017 0540   ALT 36 02/18/2017 1021   BILITOT <0.1 (L) 02/20/2017 0540   BILITOT 0.56 02/18/2017 1021       No results found for: LABCA2  No components found for: [id number]  No results for input(s): INR in the last 168 hours.  Urinalysis    Component Value Date/Time   COLORURINE YELLOW 02/19/2017 0601   APPEARANCEUR HAZY (A) 02/19/2017 0601   LABSPEC 1.009 02/19/2017 0601   PHURINE 6.0 02/19/2017 0601   GLUCOSEU 150 (A) 02/19/2017 0601   GLUCOSEU NEGATIVE 01/25/2010 0858   HGBUR MODERATE (A) 02/19/2017 0601   HGBUR 1+ 12/30/2008 0838   BILIRUBINUR NEGATIVE 02/19/2017 0601   BILIRUBINUR Neg 04/28/2015 1122   KETONESUR NEGATIVE 02/19/2017 0601   PROTEINUR NEGATIVE 02/19/2017 0601   UROBILINOGEN 0.2 04/28/2015 1122   UROBILINOGEN 0.2 01/25/2010 0858   NITRITE NEGATIVE 02/19/2017 0601   LEUKOCYTESUR LARGE (A) 02/19/2017 0601     STUDIES: Dg Chest 2 View  Result Date: 02/12/2017 CLINICAL DATA:  71 y/o F; week with altered mental status. Metastatic breast cancer. EXAM: CHEST  2 VIEW COMPARISON:  10/29/2016 chest radiograph FINDINGS: Left port catheter tip projects over lower SVC. Mild S-shaped curvature of the spine. No acute osseous abnormality is evident. Clear lungs. No pleural effusion or pneumothorax. Stable normal cardiac silhouette. IMPRESSION: No acute pulmonary process identified. Electronically Signed   By: Mitzi Hansen M.D.   On:  02/12/2017 02:27   Ct Head Wo Contrast  Result Date: 02/19/2017 CLINICAL DATA:  Larey Seat. Found sitting in floor, slid from recliner. Advanced breast cancer. EXAM: CT HEAD WITHOUT CONTRAST CT CERVICAL SPINE WITHOUT CONTRAST TECHNIQUE: Multidetector CT imaging of the head and cervical spine was performed following the standard protocol without intravenous contrast. Multiplanar CT image reconstructions of the cervical spine were also generated. COMPARISON:  MRI of the cervicothoracic and lumbar spine 11/11/2016. CT head 11/23/2004. FINDINGS: CT HEAD FINDINGS Brain: No evidence for acute infarction, hemorrhage, mass lesion, hydrocephalus, or extra-axial fluid. Moderate atrophy. Hypoattenuation of white matter likely small vessel disease. Vascular: No hyperdense vessel or unexpected calcification. Skull: Hyperostosis frontalis interna. No fracture. No definite metastatic lesions. Sinuses/Orbits: Clear. Other: None. CT CERVICAL SPINE FINDINGS Alignment: Anatomic. Skull base and vertebrae: No posttraumatic fracture. Metastatic disease at C6, C7, and the thoracic spine is better visualized on MR. No pathologic fracture is evident. Soft tissues and spinal canal: No prevertebral fluid or swelling. No visible canal hematoma. Atherosclerosis. Disc levels:  Spondylosis with disc space narrowing at C5-6. Upper chest: No mass or pneumothorax.  Port-A-Cath. Other: None. IMPRESSION: No skull fracture or intracranial hemorrhage. No cervical spine fracture or traumatic subluxation. Osseous metastatic disease at C6 and below is better visualized on MR. There is no pathologic fracture on today's exam or visible intraspinal tumor/hematoma. Electronically Signed   By: Elsie Stain M.D.   On: 02/19/2017 07:16   Ct Cervical Spine Wo Contrast  Result Date: 02/19/2017 CLINICAL DATA:  Larey Seat. Found sitting in floor, slid from recliner. Advanced breast cancer. EXAM: CT HEAD WITHOUT CONTRAST CT CERVICAL SPINE WITHOUT CONTRAST TECHNIQUE:  Multidetector CT imaging of the head and cervical spine was performed following the standard protocol without intravenous contrast. Multiplanar CT image reconstructions of the cervical spine were also generated. COMPARISON:  MRI of the cervicothoracic and lumbar spine 11/11/2016. CT head 11/23/2004. FINDINGS: CT HEAD FINDINGS Brain: No evidence for acute infarction, hemorrhage, mass lesion, hydrocephalus, or extra-axial fluid. Moderate atrophy. Hypoattenuation of white matter likely small vessel disease. Vascular: No hyperdense vessel or unexpected calcification. Skull: Hyperostosis frontalis interna. No fracture. No definite metastatic lesions. Sinuses/Orbits: Clear. Other: None. CT CERVICAL SPINE FINDINGS Alignment: Anatomic. Skull base and vertebrae: No posttraumatic fracture. Metastatic disease at C6, C7, and the thoracic spine is better visualized on MR. No pathologic fracture is evident. Soft tissues and spinal canal: No prevertebral fluid or swelling. No visible canal hematoma. Atherosclerosis. Disc levels:  Spondylosis with disc space narrowing at C5-6. Upper chest: No mass or pneumothorax.  Port-A-Cath. Other: None. IMPRESSION: No skull fracture or intracranial hemorrhage. No cervical spine fracture or traumatic subluxation. Osseous metastatic disease at C6 and below is better visualized on MR. There is no pathologic fracture on today's exam or visible intraspinal tumor/hematoma. Electronically Signed   By: Elsie Stain M.D.   On: 02/19/2017 07:16   Dg Chest Port 1 View  Result Date: 02/19/2017 CLINICAL DATA:  Per family, increased weakness, altered mental status over last few days, unwitnessed fall at home tonight EXAM: PORTABLE CHEST 1 VIEW COMPARISON:  02/12/2017 FINDINGS: Central venous catheter with tip over the cavoatrial junction region. No pneumothorax. Shallow inspiration. Heart  size and pulmonary vascularity are normal. Lungs are clear and expanded. No blunting of costophrenic angles. No  pneumothorax. Tortuous aorta. Degenerative changes in the spine. IMPRESSION: No evidence of active pulmonary disease. Electronically Signed   By: Lucienne Capers M.D.   On: 02/19/2017 06:40    ELIGIBLE FOR AVAILABLE RESEARCH PROTOCOL: no  ASSESSMENT: 71 y.o. [place], [place] woman status post right breast overlapping sites biopsy 2 and lymph node biopsy 10/07/2016 all positive for an invasive ductal carcinoma, grade 2, estrogen and progesterone receptor positive, HER-2 nonamplified, with an MIB-1 between 15 and 20%.--This is clinical stage IIIB  (a) breast MRI suggests left-sided disease, biopsy 11/18/2016 shows atypical ductal hyperplasia  (1) genetics testing 11/19/2016--Genetic counseling and testing for hereditary cancer syndromes performed on 11/19/2016. Results are negative for pathogenic mutations in 46 genes analyzed by [facility]'s Common Hereditary Cancers Panel. Results are dated 11/26/2016. Genes tested: APC, ATM, AXIN2, BARD1, BMPR1A, BRCA1, BRCA2, BRIP1, CDH1, CDKN2A, CHEK2, CTNNA1, DICER1, EPCAM, GREM1, HOXB13, KIT, MEN1, MLH1, MSH2, MSH3, MSH6, MUTYH, NBN, NF1, NTHL1, PALB2, PDGFRA, PMS2, POLD1, POLE, PTEN, RAD50, RAD51C, RAD51D, SDHA, SDHB, SDHC, SDHD, SMAD4, SMARCA4, STK11, TP53, TSC1, TSC2, and VHL.  (a) A variant of uncertain significance (not clinically actionable) was noted in CTNNA1.    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 There was a 4 cm right breast mass with right axillary lymph nodes and evidence of cirrhosis.  (3) neoadjuvant treatment to consist of cyclophosphamide, methotrexate, and fluorouracil (CMF) chemotherapy every 21 days 8, starting 11/05/2016  (4) definitive surgery to follow  (5) adjuvant radiation to follow surgery  (6) anti-estrogen therapy to follow at the completion of local treatment  PLAN: Jasmine Hood is doing well today.  Dr. [name]  came into the appointment and reviewed the plan with her.  She was recommended to do more things for herself and be more active.  She will undergo MRI tomorrow, f/u with Dr. Lucia Gaskins on 10/12 and PET on 10/18.  She will return for follow up with Dr. [name] in 4-5 weeks.    Aston and her sister and daughter know to call for any questions or concerns prior to her next appointment.     Wilber Bihari, NP  02/26/17 12:37 PM Medical Oncology and Hematology [facility] [address] [place], [place] [address] Tel. [phone]    Fax. [phone]   ADDENDUM: We have had to admit Jasmine Hood after the last 2 cycles of chemotherapy, and even now she has not fully recovered. I think her body is clearly telling us that enough is enough.  Accordingly no further chemotherapy is planned. We are obtaining an MRI to reassess and proceed to surgery.  She continues to express an interest in reconstruction. Of course this will depend on the type of surgery she undergoes. She will be discussing that with her surgeon  We will see her after her PET results are available and then she will see me postop. At that point she should be ready to start radiation and we can consider anti-estrogens at the same time.  I personally saw this patient and performed a substantive portion of this encounter with the listed APP documented above.   Chauncey Cruel, MD Medical Oncology and Hematology [facility] [address] [place], [place] [address] Tel. [phone]    Fax. [phone]

## 2017-02-26 NOTE — Telephone Encounter (Signed)
Gave patient calendars with appointments. Per 10/10 los

## 2017-02-27 ENCOUNTER — Ambulatory Visit
Admit: 2017-02-27 | Discharge: 2017-02-27 | Disposition: A | Discharge status: Home / Self Care | Payer: Medicare Other | Attending: Hematology and Oncology | Admitting: Hematology and Oncology

## 2017-02-27 DIAGNOSIS — Z170 Estrogen receptor positive status [ER+]: Principal | ICD-10-CM

## 2017-02-27 DIAGNOSIS — C50811 Malignant neoplasm of overlapping sites of right female breast: Secondary | ICD-10-CM

## 2017-02-27 MED ORDER — GADOBENATE DIMEGLUMINE 529 MG/ML IV SOLN
20.0000 mL | Freq: Once | INTRAVENOUS | Status: AC | PRN
  Administered 2017-02-27: 20 mL via INTRAVENOUS

## 2017-03-04 ENCOUNTER — Ambulatory Visit (INDEPENDENT_AMBULATORY_CARE_PROVIDER_SITE_OTHER): Payer: Medicare Other | Admitting: Internal Medicine

## 2017-03-04 ENCOUNTER — Encounter: Payer: Self-pay | Admitting: Internal Medicine

## 2017-03-04 VITALS — BP 130/80 | HR 105 | Temp 98.3°F | Ht 59.0 in | Wt 234.4 lb

## 2017-03-04 DIAGNOSIS — C50911 Malignant neoplasm of unspecified site of right female breast: Secondary | ICD-10-CM

## 2017-03-04 DIAGNOSIS — R5381 Other malaise: Secondary | ICD-10-CM | POA: Diagnosis not present

## 2017-03-04 DIAGNOSIS — C7951 Secondary malignant neoplasm of bone: Secondary | ICD-10-CM

## 2017-03-04 NOTE — Patient Instructions (Signed)
Home physical therapy as discussed  Oncology and general surgery follow-up  Return here as needed  Limit your sodium (Salt) intake

## 2017-03-04 NOTE — Progress Notes (Signed)
Subjective:    Patient ID: Jasmine Hood, female    DOB: 10-14-1945, 71 y.o.   MRN: [id number]  HPI  Admit date: 02/19/2017 Discharge date: 02/21/2017   Recommendations for Outpatient Follow-up:  1. Follow-up with Dr.Magrinat, hematology/oncology 1 week. 2. Follow-up with Marletta Lor, MD in 2 weeks.   Discharge Diagnoses:  Principal Problem:   Generalized weakness Active Problems:   Acute lower UTI   Hypothyroidism   Essential hypertension   Malignant neoplasm of overlapping sites of right breast in female, estrogen receptor positive ([place])   Morbid obesity with BMI of 50.0-59.9, adult (HCC)   Dehydration   Elevated lactic acid level   Urinary tract infection without hematuria   Weakness  71 year old patient who is seen today following a recent hospital discharge.  She has a history of metastatic breast cancer and is followed closely by oncology.  She has completed chemotherapy.  She was admitted to the hospital on her final day of chemotherapy with UTI, dehydration and profound weakness. Her care has been assisted by a sister and a daughter. Patient requires assistance in all aspects of daily living and no longer drives.  The patient initially was resistant to home physical therapy, but now she is agreeable. She is followed by general surgery and apparently bilateral mastectomies and radiation therapy is planned for the future Patient has completed antibiotic therapy for UTI and presently is on Valtrex only  Past Medical History:  Diagnosis Date  . Breast cancer ([place]) 10/07/2016   bilateral breast   . [place] recluse spider bite    right leg  . COLONIC POLYPS, HX OF 01/05/2009   Qualifier: Diagnosis of  By: [name]  MD, [name]   . Complication of anesthesia    slow to awaken after wisdom  teeth extraction age 83  . Genetic testing 11/27/2016   Ms. Coryell underwent genetic counseling and testing for hereditary cancer syndromes on 11/19/2016. Her results  were negative for mutations in all 46 genes analyzed by [facility]'s 46-gene Common Hereditary Cancers Panel. Genes analyzed include: APC, ATM, AXIN2, BARD1, BMPR1A, BRCA1, BRCA2, BRIP1, CDH1, CDKN2A, CHEK2, CTNNA1, DICER1, EPCAM, GREM1, HOXB13, KIT, MEN1, MLH1, MSH2, MSH3, MSH6, MUTYH, NBN,  . HYPOTHYROIDISM 02/01/2010   Qualifier: Diagnosis of  By: [name]  MD, [name]   . Morbid obesity ([place]) 01/05/2009   Qualifier: Diagnosis of  By: [name]  MD, [name]   . Pneumonia 1987  . Sleep apnea    no cpap used      Social History   Social History  . Marital status: Married    Spouse name: N/A  . Number of children: 5  . Years of education: N/A   Occupational History  . retired    Social History Main Topics  . Smoking status: Never Smoker  . Smokeless tobacco: Never Used  . Alcohol use No  . Drug use: No  . Sexual activity: Not on file   Other Topics Concern  . Not on file   Social History Narrative  . No narrative on file    Past Surgical History:  Procedure Laterality Date  . BREAST BIOPSY Right 10/07/2016   invasive mammary carcinoma  . BREAST BIOPSY Left 10/08/2016   left  breast fibroadenoma no malignancy  . CATARACT EXTRACTION Bilateral   . COLONOSCOPY WITH PROPOFOL N/A 12/22/2015   Procedure: COLONOSCOPY WITH PROPOFOL;  Surgeon: [name], MD;  Location: [facility];  Service: Endoscopy;  Laterality: N/A;  . colonscopy  2012   with polyp removed  . MOLE REMOVAL  10/29/2016   Procedure: MOLE REMOVAL LEFT CHEST;  Surgeon: Alphonsa Overall, MD;  Location: [facility];  Service: General;;  . PORTACATH PLACEMENT N/A 10/29/2016   Procedure: INSERTION PORT-A-CATH;  Surgeon: Alphonsa Overall, MD;  Location: [facility];  Service: General;  Laterality: N/A;  . surgery for spider bite  1998  . TUBAL LIGATION    . WISDOM TOOTH EXTRACTION  age 84    Family History  Problem Relation Age of Onset  . Breast cancer Mother 98       d.52 from metastatic disease  . Throat cancer  Father        d.65 history of smoking  . Rheum arthritis Sister        3 sisters pos. for osteo and RA  . Diabetes Maternal Aunt   . Cancer Maternal Grandfather        mouth cancer-chewed tobacco  . Breast cancer Cousin 76  . Breast cancer Daughter 15  . Stomach cancer Sister 20    Allergies  Allergen Reactions  . Bee Venom Anaphylaxis    Current Outpatient Prescriptions on File Prior to Visit  Medication Sig Dispense Refill  . dexamethasone (DECADRON) 4 MG tablet Take 2 tablets (8 mg total) by mouth daily. Start the day after chemotherapy for 2 days. Take with food. 30 tablet 1  . EPINEPHrine (EPIPEN 2-PAK) 0.3 mg/0.3 mL SOAJ injection Inject 0.3 mLs (0.3 mg total) into the muscle once. (Patient taking differently: Inject 0.3 mg into the muscle daily as needed. Allergic Reaction) 1 Device 2  . fluconazole (DIFLUCAN) 100 MG tablet Take 2 tablets (200 mg total) by mouth daily. Take for 10 days following treatment, every 21 days (Patient taking differently: Take 100 mg by mouth daily. Take for 5 days following treatment, every 21 days) 70 tablet 1  . lidocaine-prilocaine (EMLA) cream Apply 1 application topically as needed. (Patient taking differently: Apply 1 application topically daily as needed. Port Access) 30 g 0  . magic mouthwash w/lidocaine SOLN Take 5 mLs by mouth 4 (four) times daily as needed for mouth pain. 240 mL 0  . ondansetron (ZOFRAN) 4 MG tablet Take 1 tablet (4 mg total) by mouth every 6 (six) hours as needed for nausea. 20 tablet 0  . promethazine (PHENERGAN) 25 MG suppository Place 1 suppository (25 mg total) rectally every 6 (six) hours as needed for nausea or vomiting. 12 each 4  . Skin Protectants, Misc. (ULTRA-DERM EX) Apply 1 application topically 3 (three) times daily as needed. Redness    . triamcinolone ointment (KENALOG) 0.5 % Apply 1 application topically 2 (two) times daily. 30 g 0  . valACYclovir (VALTREX) 500 MG tablet Take 500 mg by mouth daily.      No  current facility-administered medications on file prior to visit.     BP 130/80   Pulse (!) 105   Temp 98.3 F (36.8 C) (Oral)   Ht _0  (1.499 m)   Wt 234 lb 6.4 oz (106.3 kg)   SpO2 93%   BMI 47.34 kg/m      Review of Systems  Constitutional: Positive for fatigue and unexpected weight change.  HENT: Negative for congestion, dental problem, hearing loss, rhinorrhea, sinus pressure, sore throat and tinnitus.   Eyes: Negative for pain, discharge and visual disturbance.  Respiratory: Positive for shortness of breath. Negative for cough.   Cardiovascular: Positive for leg swelling. Negative for chest pain and palpitations.  Gastrointestinal: Negative for abdominal distention, abdominal pain, blood in stool, constipation, diarrhea, nausea and vomiting.  Genitourinary: Negative for difficulty urinating, dysuria, flank pain, frequency, hematuria, pelvic pain, urgency, vaginal bleeding, vaginal discharge and vaginal pain.  Musculoskeletal: Negative for arthralgias, gait problem and joint swelling.  Skin: Negative for rash.  Neurological: Positive for weakness. Negative for dizziness, syncope, speech difficulty, numbness and headaches.  Hematological: Negative for adenopathy.  Psychiatric/Behavioral: Negative for agitation, behavioral problems and dysphoric mood. The patient is not nervous/anxious.        Objective:   Physical Exam  Constitutional: She is oriented to person, place, and time. She appears well-developed and well-nourished.  Obese Edematous Blood pressure 130/80 Alert, weak, appears chronically ill Requires assistance to stand from a sitting position and transfer  HENT:  Head: Normocephalic.  Right Ear: External ear normal.  Left Ear: External ear normal.  Mouth/Throat: Oropharynx is clear and moist.  Eyes: Pupils are equal, round, and reactive to light. Conjunctivae and EOM are normal.  Neck: Normal range of motion. Neck supple. No thyromegaly present.    Cardiovascular: Normal rate, regular rhythm, normal heart sounds and intact distal pulses.   Pulse rate 100 with activity  Pulmonary/Chest: Effort normal and breath sounds normal. No respiratory distress. She has no wheezes.  Abdominal: Soft. Bowel sounds are normal. She exhibits no mass. There is no tenderness.  Musculoskeletal: Normal range of motion. She exhibits edema.  Generalized edema of the extremities  Lymphadenopathy:    She has no cervical adenopathy.  Neurological: She is alert and oriented to person, place, and time.  Skin: Skin is warm and dry. No rash noted.  Psychiatric: She has a normal mood and affect. Her behavior is normal.          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

## 2017-03-05 ENCOUNTER — Other Ambulatory Visit: Payer: Self-pay | Admitting: Surgery

## 2017-03-05 DIAGNOSIS — C50911 Malignant neoplasm of unspecified site of right female breast: Secondary | ICD-10-CM

## 2017-03-06 ENCOUNTER — Inpatient Hospital Stay: Payer: Self-pay | Admitting: Internal Medicine

## 2017-03-06 ENCOUNTER — Encounter (HOSPITAL_COMMUNITY)
Admission: RE | Admit: 2017-03-06 | Discharge: 2017-03-06 | Disposition: A | Discharge status: Home / Self Care | Payer: Medicare Other | Source: Ambulatory Visit | Attending: Hematology and Oncology | Admitting: Hematology and Oncology

## 2017-03-06 ENCOUNTER — Telehealth: Payer: Self-pay

## 2017-03-06 ENCOUNTER — Other Ambulatory Visit: Payer: Self-pay

## 2017-03-06 ENCOUNTER — Ambulatory Visit: Payer: Self-pay | Admitting: Adult Health

## 2017-03-06 DIAGNOSIS — C50811 Malignant neoplasm of overlapping sites of right female breast: Secondary | ICD-10-CM | POA: Diagnosis present

## 2017-03-06 DIAGNOSIS — Z170 Estrogen receptor positive status [ER+]: Secondary | ICD-10-CM | POA: Diagnosis present

## 2017-03-06 LAB — GLUCOSE, CAPILLARY: GLUCOSE-CAPILLARY: 117 mg/dL — AB (ref 65–99)

## 2017-03-06 MED ORDER — FLUDEOXYGLUCOSE F - 18 (FDG) INJECTION
11.6000 | Freq: Once | INTRAVENOUS | Status: AC | PRN
  Administered 2017-03-06: 11.6 via INTRAVENOUS

## 2017-03-06 NOTE — Telephone Encounter (Signed)
[place] in [facility] called requesting order on PET be changed from "restaging" to "initial". This is the pt's first PET scan but not new cancer. Did not change order.

## 2017-03-07 ENCOUNTER — Telehealth: Payer: Self-pay | Admitting: Internal Medicine

## 2017-03-07 NOTE — Telephone Encounter (Signed)
[facility] it calling stating that they received a referral and the pt is requesting skill nursing for 10/22.

## 2017-03-07 NOTE — Telephone Encounter (Signed)
Verbal orders were given to [facility] per Dr Raliegh Ip

## 2017-03-11 ENCOUNTER — Telehealth: Payer: Self-pay | Admitting: *Deleted

## 2017-03-11 NOTE — Telephone Encounter (Signed)
Monique (Nurse from [facility]) is asking for verbal orders for patient to have home health and PT. They would like PT  2 times a week for 4 weeks for strengthening and gait with balance.  They would like home health for home safety. Okay to give orders?  [phone]

## 2017-03-12 NOTE — Telephone Encounter (Signed)
All okay 

## 2017-03-12 NOTE — Telephone Encounter (Signed)
Verbal orders given  

## 2017-03-18 ENCOUNTER — Other Ambulatory Visit: Payer: Self-pay | Admitting: Surgery

## 2017-03-18 ENCOUNTER — Telehealth: Payer: Self-pay | Admitting: Internal Medicine

## 2017-03-18 ENCOUNTER — Ambulatory Visit: Payer: Self-pay | Admitting: Surgery

## 2017-03-18 DIAGNOSIS — C50911 Malignant neoplasm of unspecified site of right female breast: Secondary | ICD-10-CM

## 2017-03-18 NOTE — Telephone Encounter (Signed)
Liji with [facility] home [facility] would like you to know pt has asked to cancel PT this week due to multiple dr visits elsewhere.  Liji to resume next week.  Nothing further needed.

## 2017-03-18 NOTE — Telephone Encounter (Signed)
Noted, thank you

## 2017-03-21 ENCOUNTER — Encounter: Payer: Self-pay | Admitting: Adult Health

## 2017-03-21 ENCOUNTER — Ambulatory Visit (HOSPITAL_BASED_OUTPATIENT_CLINIC_OR_DEPARTMENT_OTHER): Payer: Medicare Other

## 2017-03-21 ENCOUNTER — Other Ambulatory Visit: Payer: Self-pay

## 2017-03-21 ENCOUNTER — Ambulatory Visit (HOSPITAL_BASED_OUTPATIENT_CLINIC_OR_DEPARTMENT_OTHER): Payer: Medicare Other | Admitting: Adult Health

## 2017-03-21 ENCOUNTER — Telehealth: Payer: Self-pay | Admitting: Oncology

## 2017-03-21 ENCOUNTER — Ambulatory Visit: Payer: Self-pay | Admitting: Oncology

## 2017-03-21 VITALS — BP 151/92 | HR 117 | Temp 98.6°F | Resp 18 | Ht 59.0 in | Wt 235.9 lb

## 2017-03-21 DIAGNOSIS — C50811 Malignant neoplasm of overlapping sites of right female breast: Secondary | ICD-10-CM | POA: Diagnosis not present

## 2017-03-21 DIAGNOSIS — Z6841 Body Mass Index (BMI) 40.0 and over, adult: Secondary | ICD-10-CM

## 2017-03-21 DIAGNOSIS — Z452 Encounter for adjustment and management of vascular access device: Secondary | ICD-10-CM

## 2017-03-21 DIAGNOSIS — Z170 Estrogen receptor positive status [ER+]: Secondary | ICD-10-CM | POA: Diagnosis not present

## 2017-03-21 DIAGNOSIS — C7951 Secondary malignant neoplasm of bone: Secondary | ICD-10-CM

## 2017-03-21 DIAGNOSIS — C773 Secondary and unspecified malignant neoplasm of axilla and upper limb lymph nodes: Secondary | ICD-10-CM | POA: Diagnosis not present

## 2017-03-21 DIAGNOSIS — Z95828 Presence of other vascular implants and grafts: Secondary | ICD-10-CM

## 2017-03-21 DIAGNOSIS — K7469 Other cirrhosis of liver: Secondary | ICD-10-CM

## 2017-03-21 LAB — CBC WITH DIFFERENTIAL/PLATELET
BASO%: 1 % (ref 0.0–2.0)
BASOS ABS: 0.1 10*3/uL (ref 0.0–0.1)
EOS ABS: 0.1 10*3/uL (ref 0.0–0.5)
EOS%: 1.6 % (ref 0.0–7.0)
HCT: 28.8 % — ABNORMAL LOW (ref 34.8–46.6)
HGB: 9.6 g/dL — ABNORMAL LOW (ref 11.6–15.9)
LYMPH%: 15 % (ref 14.0–49.7)
MCH: 33.5 pg (ref 25.1–34.0)
MCHC: 33.2 g/dL (ref 31.5–36.0)
MCV: 101 fL (ref 79.5–101.0)
MONO#: 0.7 10*3/uL (ref 0.1–0.9)
MONO%: 11.4 % (ref 0.0–14.0)
NEUT#: 4.2 10*3/uL (ref 1.5–6.5)
NEUT%: 71 % (ref 38.4–76.8)
Platelets: 167 10*3/uL (ref 145–400)
RBC: 2.85 10*6/uL — AB (ref 3.70–5.45)
RDW: 22.2 % — ABNORMAL HIGH (ref 11.2–14.5)
WBC: 5.8 10*3/uL (ref 3.9–10.3)
lymph#: 0.9 10*3/uL (ref 0.9–3.3)

## 2017-03-21 LAB — COMPREHENSIVE METABOLIC PANEL
ALT: 29 U/L (ref 0–55)
ANION GAP: 8 meq/L (ref 3–11)
AST: 53 U/L — ABNORMAL HIGH (ref 5–34)
Albumin: 2.4 g/dL — ABNORMAL LOW (ref 3.5–5.0)
Alkaline Phosphatase: 123 U/L (ref 40–150)
BUN: 6.6 mg/dL — ABNORMAL LOW (ref 7.0–26.0)
CO2: 23 meq/L (ref 22–29)
Calcium: 8.6 mg/dL (ref 8.4–10.4)
Chloride: 108 mEq/L (ref 98–109)
Creatinine: 0.6 mg/dL (ref 0.6–1.1)
GLUCOSE: 120 mg/dL (ref 70–140)
POTASSIUM: 3.7 meq/L (ref 3.5–5.1)
SODIUM: 140 meq/L (ref 136–145)
Total Bilirubin: 0.55 mg/dL (ref 0.20–1.20)
Total Protein: 6.3 g/dL — ABNORMAL LOW (ref 6.4–8.3)

## 2017-03-21 MED ORDER — HEPARIN SOD (PORK) LOCK FLUSH 100 UNIT/ML IV SOLN
500.0000 [IU] | Freq: Once | INTRAVENOUS | Status: AC
  Administered 2017-03-21: 500 [IU] via INTRAVENOUS
  Filled 2017-03-21: qty 5

## 2017-03-21 MED ORDER — ULTRA-DERM EX LOTN: 1.0000 "application " | TOPICAL_LOTION | Freq: Three times a day (TID) | CUTANEOUS | 0 refills | Status: DC | PRN

## 2017-03-21 MED ORDER — SODIUM CHLORIDE 0.9% FLUSH
10.0000 mL | INTRAVENOUS | Status: DC | PRN
  Administered 2017-03-21: 10 mL via INTRAVENOUS
  Filled 2017-03-21: qty 10

## 2017-03-21 MED ORDER — VALACYCLOVIR HCL 500 MG PO TABS: 500.0000 mg | ORAL_TABLET | Freq: Every day | ORAL | 0 refills | Status: DC

## 2017-03-21 MED ORDER — FLUCONAZOLE 100 MG PO TABS: 200.0000 mg | ORAL_TABLET | Freq: Every day | ORAL | 0 refills | Status: DC

## 2017-03-21 NOTE — Patient Instructions (Signed)
Implanted Port Home Guide An implanted port is a type of central line that is placed under the skin. Central lines are used to provide IV access when treatment or nutrition needs to be given through a person's veins. Implanted ports are used for long-term IV access. An implanted port may be placed because:  You need IV medicine that would be irritating to the small veins in your hands or arms.  You need long-term IV medicines, such as antibiotics.  You need IV nutrition for a long period.  You need frequent blood draws for lab tests.  You need dialysis.  Implanted ports are usually placed in the chest area, but they can also be placed in the upper arm, the abdomen, or the leg. An implanted port has two main parts:  Reservoir. The reservoir is round and will appear as a small, raised area under your skin. The reservoir is the part where a needle is inserted to give medicines or draw blood.  Catheter. The catheter is a thin, flexible tube that extends from the reservoir. The catheter is placed into a large vein. Medicine that is inserted into the reservoir goes into the catheter and then into the vein.  How will I care for my incision site? Do not get the incision site wet. Bathe or shower as directed by your health care provider. How is my port accessed? Special steps must be taken to access the port:  Before the port is accessed, a numbing cream can be placed on the skin. This helps numb the skin over the port site.  Your health care provider uses a sterile technique to access the port. ? Your health care provider must put on a mask and sterile gloves. ? The skin over your port is cleaned carefully with an antiseptic and allowed to dry. ? The port is gently pinched between sterile gloves, and a needle is inserted into the port.  Only "non-coring" port needles should be used to access the port. Once the port is accessed, a blood return should be checked. This helps ensure that the port  is in the vein and is not clogged.  If your port needs to remain accessed for a constant infusion, a clear (transparent) bandage will be placed over the needle site. The bandage and needle will need to be changed every week, or as directed by your health care provider.  Keep the bandage covering the needle clean and dry. Do not get it wet. Follow your health care provider's instructions on how to take a shower or bath while the port is accessed.  If your port does not need to stay accessed, no bandage is needed over the port.  What is flushing? Flushing helps keep the port from getting clogged. Follow your health care provider's instructions on how and when to flush the port. Ports are usually flushed with saline solution or a medicine called heparin. The need for flushing will depend on how the port is used.  If the port is used for intermittent medicines or blood draws, the port will need to be flushed: ? After medicines have been given. ? After blood has been drawn. ? As part of routine maintenance.  If a constant infusion is running, the port may not need to be flushed.  How long will my port stay implanted? The port can stay in for as long as your health care provider thinks it is needed. When it is time for the port to come out, surgery will be   done to remove it. The procedure is similar to the one performed when the port was put in. When should I seek immediate medical care? When you have an implanted port, you should seek immediate medical care if:  You notice a bad smell coming from the incision site.  You have swelling, redness, or drainage at the incision site.  You have more swelling or pain at the port site or the surrounding area.  You have a fever that is not controlled with medicine.  This information is not intended to replace advice given to you by your health care provider. Make sure you discuss any questions you have with your health care provider. Document  Released: 05/06/2005 Document Revised: 10/12/2015 Document Reviewed: 01/11/2013 Elsevier Interactive Patient Education  2017 Elsevier Inc.  

## 2017-03-21 NOTE — Telephone Encounter (Signed)
Spoke with patient's sister and confirmed appt that was added per 10/30 sch msg

## 2017-03-21 NOTE — Progress Notes (Signed)
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  She is doing well today.  She is regaining her strength, and eating well and moving around more.  She is uncertain about her upcoming surgery.  Her daughter is uncertain as well.    REVIEW OF SYSTEMS: Jasmine Hood is requesting more diflucan as she feels like she may be getting a vaginal yeast infection.  She also is requesting a refill sent to [facility] on her valtrex along with uniderm ointment for her folds.  She denies any new issues and a detailed ROS is otherwise normal.    BREAST CANCER HISTORY: From the original intake note  Jasmine Hood herself noted a change in her right breast sometime in September 2017. She did not immediately bring it to medical attention. When she noted some significant changes in her right nipple she saw [name] and was set up for bilateral diagnostic mammography with tomography and [facility]. This found the breast density to be category B. In the upper and lower outer quadrants of the right breast there was a mass measuring at least 10 cm. There were also groups of heterogeneous calcifications measuring 3 cm and a separate group 0.4 cm. On physical exam there was a palpable firm mass measuring  approximately 10 cm involving the upper outer and lower outer quadrant of the right breast, with skin reaction and nipple retraction. There was no palpable right axillary adenopathy.  Right breast ultrasonography confirmed a large hypoechoic mass with irregular margins extending to the skin surface. The right axilla showed 2 lymph nodes which appeared abnormal.  In the left breast there were some indeterminate retroareolar calcifications which were felt to warrant biopsy. This was performed 10/08/2016 and showed a fibroadenoma ([id number]).  Biopsy of the right breast mass at the 7:00 and 10:00 positions as well as one of the suspicious lymph nodes all showed invasive ductal carcinoma, E-cadherin positive. Separate prognostic profiles from the 2 breast masses were sent. Estrogen receptor was positive at 95-70%, progesterone receptor was positive at 15-85%, all with strong staining intensity, the MIB-1 was 15-20%, and HER-2 was nonamplified, the signals ratio being 1.28-1.72 and the number per cell 1.85-3.95.  The patient's subsequent history is as detailed below   PAST MEDICAL HISTORY: Past Medical History:  Diagnosis Date  . Breast cancer ([place]) 10/07/2016   bilateral breast   . [place] Shark recluse spider bite    right leg  . COLONIC POLYPS, HX OF 01/05/2009   Qualifier: Diagnosis of  By: [name]  MD, [name]   . Complication of anesthesia    slow to awaken after wisdom  teeth extraction age 46  . Genetic testing 11/27/2016   Ms. Lun underwent genetic counseling and testing for hereditary cancer syndromes on 11/19/2016. Her results were negative for mutations in all 46 genes analyzed by [facility]'s 46-gene Common Hereditary  Cancers Panel. Genes analyzed include: APC, ATM, AXIN2, BARD1, BMPR1A, BRCA1, BRCA2, BRIP1, CDH1, CDKN2A, CHEK2, CTNNA1, DICER1, EPCAM, GREM1, HOXB13, KIT, MEN1, MLH1, MSH2, MSH3, MSH6, MUTYH, NBN,  . HYPOTHYROIDISM 02/01/2010   Qualifier: Diagnosis of  By: [name]  MD,  [name]   . Morbid obesity ([place]) 01/05/2009   Qualifier: Diagnosis of  By: [name]  MD, [name]   . Pneumonia 1987  . Sleep apnea    no cpap used     PAST SURGICAL HISTORY: Past Surgical History:  Procedure Laterality Date  . BREAST BIOPSY Right 10/07/2016   invasive mammary carcinoma  . BREAST BIOPSY Left 10/08/2016   left  breast fibroadenoma no malignancy  . CATARACT EXTRACTION Bilateral   . COLONOSCOPY WITH PROPOFOL N/A 12/22/2015   Procedure: COLONOSCOPY WITH PROPOFOL;  Surgeon: [name], MD;  Location: [facility];  Service: Endoscopy;  Laterality: N/A;  . colonscopy  2012   with polyp removed  . MOLE REMOVAL  10/29/2016   Procedure: MOLE REMOVAL LEFT CHEST;  Surgeon: Alphonsa Overall, MD;  Location: [facility];  Service: General;;  . PORTACATH PLACEMENT N/A 10/29/2016   Procedure: INSERTION PORT-A-CATH;  Surgeon: Alphonsa Overall, MD;  Location: [facility];  Service: General;  Laterality: N/A;  . surgery for spider bite  1998  . TUBAL LIGATION    . WISDOM TOOTH EXTRACTION  age 86    FAMILY HISTORY Family History  Problem Relation Age of Onset  . Breast cancer Mother 62       d.52 from metastatic disease  . Throat cancer Father        d.65 history of smoking  . Rheum arthritis Sister        3 sisters pos. for osteo and RA  . Diabetes Maternal Aunt   . Cancer Maternal Grandfather        mouth cancer-chewed tobacco  . Breast cancer Cousin 15  . Breast cancer Daughter 55  . Stomach cancer Sister 59  The patient's father died at the age of 76 from laryngeal cancer in the setting of tobacco abuse. The patient's mother died at the age of 61 from metastatic breast cancer which had been diagnosed in her early 27s. The patient has 2 half-sisters and one half-brother. One of her sisters has "stomach" cancer. There is also a cousin on the maternal side with breast cancer diagnosed at age 31. No family member has been genetically tested yet  GYNECOLOGIC HISTORY:  No LMP recorded.  Patient is postmenopausal. Menarche age 70, first live birth age 36, she is [place] P5. She is status post bilateral tubal ligation. She stopped having periods in 1985. She tells me she did not use hormone replacement. However a note from her bone density scan December 2001 states "this patient began hormone replacement therapy 3 months ago"  SOCIAL HISTORY:  She is originally from [place]. 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 She has survived 2 husband's period at home currently is just she and her daughter Harvest Deist. She tells me this daughter had mild brain damage at birth and is not working, and cannot drive because of a history of seizures. However she is the one she is planning to name is her healthcare power of attorney. The patient has 13 grandchildren and 4 great-grandchildren. She attends a [employer]: At the 10/21/2016 visit the patient was given the appropriate documents to complete and notarize at her discretion   HEALTH  MAINTENANCE: Social History  Substance Use Topics  . Smoking status: Never Smoker  . Smokeless tobacco: Never Used  . Alcohol use No     Colonoscopy: 12/22/2015/Nandigam  PAP:  Bone density: December 2001 was normal   Allergies  Allergen Reactions  . Bee Venom Anaphylaxis    Current Outpatient Prescriptions  Medication Sig Dispense Refill  . dexamethasone (DECADRON) 4 MG tablet Take 2 tablets (8 mg total) by mouth daily. Start the day after chemotherapy for 2 days. Take with food. 30 tablet 1  . EPINEPHrine (EPIPEN 2-PAK) 0.3 mg/0.3 mL SOAJ injection Inject 0.3 mLs (0.3 mg total) into the muscle once. (Patient taking differently: Inject 0.3 mg into the muscle daily as needed. Allergic Reaction) 1 Device 2  . fluconazole (DIFLUCAN) 100 MG tablet Take 2 tablets (200 mg total) by mouth daily. Take for 10 days following treatment, every 21 days (Patient taking differently: Take 100 mg by mouth daily. Take  for 5 days following treatment, every 21 days) 70 tablet 1  . lidocaine-prilocaine (EMLA) cream Apply 1 application topically as needed. (Patient taking differently: Apply 1 application topically daily as needed. Port Access) 30 g 0  . magic mouthwash w/lidocaine SOLN Take 5 mLs by mouth 4 (four) times daily as needed for mouth pain. 240 mL 0  . ondansetron (ZOFRAN) 4 MG tablet Take 1 tablet (4 mg total) by mouth every 6 (six) hours as needed for nausea. 20 tablet 0  . promethazine (PHENERGAN) 25 MG suppository Place 1 suppository (25 mg total) rectally every 6 (six) hours as needed for nausea or vomiting. 12 each 4  . Skin Protectants, Misc. (ULTRA-DERM EX) Apply 1 application topically 3 (three) times daily as needed. Redness    . triamcinolone ointment (KENALOG) 0.5 % Apply 1 application topically 2 (two) times daily. 30 g 0  . valACYclovir (VALTREX) 500 MG tablet Take 500 mg by mouth daily.      No current facility-administered medications for this visit.     OBJECTIVE:   Vitals:   03/21/17 1023  BP: (!) 151/92  Pulse: (!) 117  Resp: 18  Temp: 98.6 F (37 C)  SpO2: 98%     Body mass index is 47.65 kg/m.    ECOG FS:1 - Symptomatic but completely ambulatory  GENERAL: Patient is a chronically ill appearing older obese female in no acute distress HEENT:  Sclerae anicteric.  Oropharynx clear and moist. No ulcerations or evidence of oropharyngeal candidiasis. Neck is supple.  NODES:  No cervical, supraclavicular, or axillary lymphadenopathy palpated.  BREAST EXAM:  Deferred.  LUNGS:  Clear to auscultation bilaterally.  No wheezes or rhonchi. HEART:  Regular rate and rhythm. No murmur appreciated. ABDOMEN:  Soft, nontender.  Positive, normoactive bowel sounds. No organomegaly palpated. MSK:  No focal spinal tenderness to palpation. Full range of motion bilaterally in the upper extremities. EXTREMITIES:  No peripheral edema.   SKIN:  Clear with no obvious rashes or skin changes. No nail  dyscrasia. NEURO:  Nonfocal. Well oriented.  Appropriate affect.      LAB RESULTS:  CMP     Component Value Date/Time   NA 140 03/21/2017 0918   K 3.7 03/21/2017 0918   CL 108 02/21/2017 0500   CO2 23 03/21/2017 0918   GLUCOSE 120 03/21/2017 0918   BUN 6.6 (L) 03/21/2017 0918   CREATININE 0.6 03/21/2017 0918   CALCIUM 8.6 03/21/2017 0918   PROT 6.3 (L) 03/21/2017 0918   ALBUMIN 2.4 (L) 03/21/2017 3149  AST 53 (H) 03/21/2017 0918   ALT 29 03/21/2017 0918   ALKPHOS 123 03/21/2017 0918   BILITOT 0.55 03/21/2017 0918   GFRNONAA >60 02/21/2017 0500   GFRAA >60 02/21/2017 0500    No results found for: TOTALPROTELP, ALBUMINELP, A1GS, A2GS, BETS, BETA2SER, GAMS, MSPIKE, SPEI  No results found for: Nils Pyle, [facility]  Lab Results  Component Value Date   WBC 5.8 03/21/2017   NEUTROABS 4.2 03/21/2017   HGB 9.6 (L) 03/21/2017   HCT 28.8 (L) 03/21/2017   MCV 101.0 03/21/2017   PLT 167 03/21/2017      Chemistry      Component Value Date/Time   NA 140 03/21/2017 0918   K 3.7 03/21/2017 0918   CL 108 02/21/2017 0500   CO2 23 03/21/2017 0918   BUN 6.6 (L) 03/21/2017 0918   CREATININE 0.6 03/21/2017 0918      Component Value Date/Time   CALCIUM 8.6 03/21/2017 0918   ALKPHOS 123 03/21/2017 0918   AST 53 (H) 03/21/2017 0918   ALT 29 03/21/2017 0918   BILITOT 0.55 03/21/2017 0918       No results found for: LABCA2  No components found for: [id number]  No results for input(s): INR in the last 168 hours.  Urinalysis    Component Value Date/Time   COLORURINE YELLOW 02/19/2017 0601   APPEARANCEUR HAZY (A) 02/19/2017 0601   LABSPEC 1.009 02/19/2017 0601   PHURINE 6.0 02/19/2017 0601   GLUCOSEU 150 (A) 02/19/2017 0601   GLUCOSEU NEGATIVE 01/25/2010 0858   HGBUR MODERATE (A) 02/19/2017 0601   HGBUR 1+ 12/30/2008 0838   BILIRUBINUR NEGATIVE 02/19/2017 0601   BILIRUBINUR Neg 04/28/2015 1122   KETONESUR NEGATIVE 02/19/2017 0601   PROTEINUR  NEGATIVE 02/19/2017 0601   UROBILINOGEN 0.2 04/28/2015 1122   UROBILINOGEN 0.2 01/25/2010 0858   NITRITE NEGATIVE 02/19/2017 0601   LEUKOCYTESUR LARGE (A) 02/19/2017 0601     STUDIES: Mr Breast Bilateral W Wo Contrast  Result Date: 02/27/2017 CLINICAL DATA:  71 year old female with 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 Patient also has thoracic spine metastases confirmed on MRI dated 11/11/2016. Stereotactic guided biopsy of calcifications in the upper-outer left breast 10/08/2016 demonstrated a fibroadenoma with calcifications and MRI guided biopsy of non mass enhancement in the left breast demonstrated atypical ductal hyperplasia. Assess response to neoadjuvant chemotherapy. LABS:  Not applicable EXAM: BILATERAL BREAST MRI WITH AND WITHOUT CONTRAST TECHNIQUE: Multiplanar, multisequence MR images of both breasts were obtained prior to and following the intravenous administration of 20 ml of MultiHance. THREE-DIMENSIONAL MR IMAGE RENDERING ON INDEPENDENT WORKSTATION: Three-dimensional MR images were rendered by post-processing of the original MR data on an independent workstation. The three-dimensional MR images were interpreted, and findings are reported in the following complete MRI report for this study. Three dimensional images were evaluated at the independent DynaCad workstation COMPARISON:  Previous exam(s). FINDINGS: Breast composition: b.  Scattered fibroglandular tissue. Background parenchymal enhancement: Minimal. Right breast: Known right breast malignancy is slightly less masslike in appearance but still demonstrating extensive mass and non mass enhancement throughout the central to outer right breast with associated nipple and skin retraction. This now measures 8 cm AP, 4 cm transverse and 5 cm craniocaudal, previously measured greater than 10 cm x 4 cm x 7 cm. No new abnormal areas of  enhancement seen in the right breast. Left breast: No suspicious enhancing masses or abnormal areas of enhancement in the  left breast. The previously seen linear clumped non mass enhancement in the posterior left breast is no longer identified. Lymph nodes: Interval decrease in size of right axillary lymphadenopathy, with the largest right axillary lymph node now measuring 1.7 x 0.9 cm, previously measured 2.7 x 1.5 cm. Ancillary findings: A port is present in the right upper medial breast. Increased T2 signal and mild enhancement involving the sternum, left clavicle and bilateral humeri possibly related to treatment changes however osseous metastatic disease cannot be ruled out. IMPRESSION: 1. Biopsy proven malignancy in the central to outer right breast now measures slightly smaller when compared to prior exam and appears less masslike, now measuring 8 x 4 x 5 cm, previously greater than 10 x 4 x 7 cm. In addition, right axillary lymphadenopathy has markedly decreased in the interval. 2. Previously seen linear clumped non mass enhancement in the posterior left breast is no longer identified. 3. Enhancement involving the sternum, left clavicle and bilateral humeri in patient with known osseous metastatic disease. Correlate with bone scan. RECOMMENDATION: Treatment plan for known right breast malignancy. BI-RADS CATEGORY  6: Known biopsy-proven malignancy. Electronically Signed   By: Everlean Alstrom M.D.   On: 02/27/2017 15:00   Nm Pet Image Restag (ps) Skull Base To Thigh  Result Date: 03/06/2017 CLINICAL DATA:  Subsequent Treatment strategy for breast cancer. EXAM: NUCLEAR MEDICINE PET SKULL BASE TO THIGH TECHNIQUE: 11.6 mCi F-18 FDG was injected intravenously. Full-ring PET imaging was performed from the skull base to thigh after the radiotracer. CT data was obtained and used for attenuation correction and anatomic localization. FASTING BLOOD GLUCOSE:  Value: 117 mg/dl COMPARISON:  Chest CT 10/30/2016.  FINDINGS: NECK: No hypermetabolic lymph nodes in the neck. CHEST: 2.1 nodule in the posterior right costophrenic sulcus (image 41 series 8) is hypermetabolic with SUV max = 5.1. This was not present on the CT scan from 10/30/2016. Atelectasis noted both posterior lower lobes. No evidence for hypermetabolic mediastinal or hilar lymphadenopathy. Left Port-A-Cath tip is positioned at the SVC/RA junction. ABDOMEN/PELVIS: No abnormal hypermetabolic activity within the liver, pancreas, adrenal glands, or spleen. Liver uptake is quite mottled making assessment for small hypermetabolic liver lesions suboptimal. No underlying focal liver lesion is evident on the noncontrast CT images. No hypermetabolic lymph nodes in the abdomen or pelvis. 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 There is abdominal aortic atherosclerosis without aneurysm. SKELETON: Scattered hypermetabolic lesions are seen in the bony anatomy consistent with metastatic disease. Index lesion in the L1 vertebral body demonstrates SUV max = 6.6 although no underlying correlating lesion is visible by CT. IMPRESSION: 1. Hypermetabolic bone metastases as seen on previous bone scintigraphy and MRI studies. 2. 2.1 cm hypermetabolic nodule posterior right lower lobe. While this lesion may be neoplastic, it is new in the 4 month interval since the prior chest CT which would be relatively rapid appearance for neoplasm. Organizing pneumonia secondary to infection, inflammation, or lung injury would also be a possibility. Consider diagnostic CT exam of the chest to further evaluate. 3. 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 Attention on follow-up recommended. Electronically Signed   By: Misty Stanley M.D.   On: 03/06/2017 16:12    ELIGIBLE FOR AVAILABLE RESEARCH PROTOCOL: no  ASSESSMENT: 72 y.o. [place], [place] woman status post right  breast overlapping sites biopsy 2 and lymph node biopsy 10/07/2016 all positive for an invasive ductal carcinoma, grade  2, estrogen and progesterone receptor positive, HER-2 nonamplified, with an MIB-1 between 15 and 20%.--This is clinical stage IIIB  (a) breast MRI suggests left-sided disease, biopsy 11/18/2016 shows atypical ductal hyperplasia  (1) genetics testing 11/19/2016--Genetic counseling and testing for hereditary cancer syndromes performed on 11/19/2016. Results are negative for pathogenic mutations in 46 genes analyzed by [facility]'s Common Hereditary Cancers Panel. Results are dated 11/26/2016. Genes tested: APC, ATM, AXIN2, BARD1, BMPR1A, BRCA1, BRCA2, BRIP1, CDH1, CDKN2A, CHEK2, CTNNA1, DICER1, EPCAM, GREM1, HOXB13, KIT, MEN1, MLH1, MSH2, MSH3, MSH6, MUTYH, NBN, NF1, NTHL1, PALB2, PDGFRA, PMS2, POLD1, POLE, PTEN, RAD50, RAD51C, RAD51D, SDHA, SDHB, SDHC, SDHD, SMAD4, SMARCA4, STK11, TP53, TSC1, TSC2, and VHL.  (a) A variant of uncertain significance (not clinically actionable) was noted in CTNNA1.    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 There was a 4 cm right breast mass with right axillary lymph nodes and evidence of cirrhosis.  (3) neoadjuvant treatment to consist of cyclophosphamide, methotrexate, and fluorouracil (CMF) chemotherapy every 21 days 8, starting 11/05/2016  (4) definitive surgery to follow  (5) adjuvant radiation to follow surgery  (6) anti-estrogen therapy to follow at the completion of local treatment  PLAN: Jasmine Hood is doing well today. I sent the diflucan into her pharmacy in addition to the uniderm and valtrex.  Should she continue to have vaginal discharge after taking the diflucan, then she needs to go to the gynecologist and undergo eval.  She verbalized understanding of this.  I reviewed Dr. Jennell Corner note and informed the patient that he called her  earlier this week on Tuesday.  I informed them that the daughter (who was at the appointment today as well) was in the background.  They cannot recall this phone call.  I gave them Colletta Maryland, at [employer] card with her phone number.  I told them to check their mail for information and also call her for details about surgery.  They verbalized understanding.    Jasmine Hood will return on 12/7 for follow up with Dr. [name] to review her pathology results.  Jasmine Hood and her sister and daughter know to call for any questions or concerns prior to her next appointment.    A total of (30) minutes of face-to-face time was spent with this patient with greater than 50% of that time in counseling and care-coordination.    Wilber Bihari, NP  03/21/17 10:41 AM Medical Oncology and Hematology [facility] [address] [place], [place] [address] Tel. [phone]    Fax. [phone]

## 2017-03-28 ENCOUNTER — Telehealth: Payer: Self-pay | Admitting: Adult Health

## 2017-03-28 NOTE — Telephone Encounter (Signed)
Per 11/2 - no los at check out °

## 2017-03-31 ENCOUNTER — Inpatient Hospital Stay (HOSPITAL_COMMUNITY)
Admission: RE | Admit: 2017-03-31 | Discharge: 2017-03-31 | Disposition: A | Discharge status: Home / Self Care | Payer: Self-pay | Source: Ambulatory Visit

## 2017-03-31 NOTE — Progress Notes (Signed)
I spoke to patient who had a 1200 appointment in PAT- I called her at 1218.  Patient said she is "not coming out in this weather" and did not have the phome number for PAT.

## 2017-03-31 NOTE — Pre-Procedure Instructions (Addendum)
Jasmine Hood  03/31/2017       Your procedure is scheduled on Friday, November 16.  Report to [facility] Admitting at 7:30 AM                          Your surgery or procedure is scheduled for 9:30 AM    Call this number if you have problems the morning of surgery: [phone]                  For any other questions, please call [phone], Monday - Friday 8 AM - 4 PM.   Remember:  Do not eat food or drink liquids after midnight Thursday, November 15 .EXCEPT: Drink  Ensure Pre- Surgery at 5:30 the morning of surgery  Take these medicines the morning of surgery with A SIP OF WATER : valACYclovir (VALTREX) .           STOP taking Aspirin, Aspirin Products (Goody Powder, Excedrin Migraine), Ibuprofen (Advil), Naproxen (Aleve), Vitamins and Herbal Products (ie Fish Oil)`       Special instructions:  [place]- Preparing For Surgery  Before surgery, you can play an important role. Because skin is not sterile, your skin needs to be as free of germs as possible. You can reduce the number of germs on your skin by washing with CHG (chlorahexidine gluconate) Soap before surgery.  CHG is an antiseptic cleaner which kills germs and bonds with the skin to continue killing germs even after washing.  Please do not use if you have an allergy to CHG or antibacterial soaps. If your skin becomes reddened/irritated stop using the CHG.  Do not shave (including legs and underarms) for at least 48 hours prior to first CHG shower. It is OK to shave your face.  Please follow these instructions carefully.   1. Shower the NIGHT BEFORE SURGERY and the MORNING OF SURGERY with CHG.   2. If you chose to wash your hair, wash your hair first as usual with your normal shampoo.  3. After you shampoo, rinse your hair and body thoroughly to remove the shampoo.     Wash your face and private area with the soap you use at home, then rinse.  4. Use CHG as you would any other liquid soap. You can  apply CHG directly to the skin and wash gently with a scrungie or a clean washcloth.   5. Apply the CHG Soap to your body ONLY FROM THE NECK DOWN.  Do not use on open wounds or open sores. Avoid contact with your eyes, ears, mouth and genitals (private parts). Wash Face and genitals (private parts)  with your normal soap.  6. Wash thoroughly, paying special attention to the area where your surgery will be performed.  7. Thoroughly rinse your body with warm water from the neck down.  8. DO NOT shower/wash with your normal soap after using and rinsing off the CHG Soap.  9. Pat yourself dry with a CLEAN TOWEL.  10. Wear CLEAN PAJAMAS to bed the night before surgery, wear comfortable clothes the morning of surgery  11. Place CLEAN SHEETS on your bed the night of your first shower and DO NOT SLEEP WITH PETS.  Day of Surgery: Do not apply any deodorants/lotions. Powders or colognes. Please wear clean clothes to the hospital/surgery center.    Do not wear jewelry, make-up or nail polish.  Do not shave 48 hours prior to  surgery.  Men may shave face and neck.  Do not bring valuables to the hospital.  [facility] is not responsible for any belongings or valuables.  Contacts, dentures or bridgework may not be worn into surgery.  Leave your suitcase in the car.  After surgery it may be brought to your room.  For patients admitted to the hospital, discharge time will be determined by your treatment team.  Patients discharged the day of surgery will not be allowed to drive home.   Please read over the following fact sheets that you were given. Pain Booklet, Coughing and Deep Breathing, Surgical Site Infections.

## 2017-04-01 ENCOUNTER — Other Ambulatory Visit: Payer: Self-pay

## 2017-04-01 ENCOUNTER — Encounter (HOSPITAL_COMMUNITY): Payer: Self-pay

## 2017-04-01 ENCOUNTER — Encounter (HOSPITAL_COMMUNITY)
Admission: RE | Admit: 2017-04-01 | Discharge: 2017-04-01 | Disposition: A | Discharge status: Home / Self Care | Payer: Medicare Other | Source: Ambulatory Visit | Attending: Surgery | Admitting: Surgery

## 2017-04-01 DIAGNOSIS — G4700 Insomnia, unspecified: Secondary | ICD-10-CM | POA: Diagnosis not present

## 2017-04-01 DIAGNOSIS — C773 Secondary and unspecified malignant neoplasm of axilla and upper limb lymph nodes: Secondary | ICD-10-CM | POA: Diagnosis not present

## 2017-04-01 DIAGNOSIS — R2689 Other abnormalities of gait and mobility: Secondary | ICD-10-CM | POA: Diagnosis not present

## 2017-04-01 DIAGNOSIS — C50911 Malignant neoplasm of unspecified site of right female breast: Secondary | ICD-10-CM | POA: Diagnosis present

## 2017-04-01 DIAGNOSIS — Z6841 Body Mass Index (BMI) 40.0 and over, adult: Secondary | ICD-10-CM | POA: Diagnosis not present

## 2017-04-01 DIAGNOSIS — Z79899 Other long term (current) drug therapy: Secondary | ICD-10-CM | POA: Diagnosis not present

## 2017-04-01 DIAGNOSIS — C7951 Secondary malignant neoplasm of bone: Secondary | ICD-10-CM | POA: Diagnosis not present

## 2017-04-01 HISTORY — DX: Cerebral infarction, unspecified: I63.9

## 2017-04-01 HISTORY — DX: Constipation, unspecified: K59.00

## 2017-04-01 LAB — CBC
HCT: 42.1 % (ref 36.0–46.0)
Hemoglobin: 13.7 g/dL (ref 12.0–15.0)
MCH: 34.8 pg — AB (ref 26.0–34.0)
MCHC: 32.5 g/dL (ref 30.0–36.0)
MCV: 106.9 fL — ABNORMAL HIGH (ref 78.0–100.0)
PLATELETS: ADEQUATE 10*3/uL (ref 150–400)
RBC: 3.94 MIL/uL (ref 3.87–5.11)
RDW: 19.6 % — AB (ref 11.5–15.5)
WBC: 7.4 10*3/uL (ref 4.0–10.5)

## 2017-04-01 NOTE — Progress Notes (Signed)
Pt denies cardiac history, chest pain, sob or diabetes.

## 2017-04-01 NOTE — Pre-Procedure Instructions (Addendum)
Jasmine Hood  04/01/2017       Your procedure is scheduled on Friday, April 04, 2017 at 9:30 AM.   Report to [facility] Entrance "A" Admitting Office at 7:30 AM                     Call this number if you have problems the morning of surgery: [phone]                            Questions prior to day of surgery, please call [phone] between 8 & 4 PM.   Remember:  Do not eat food or drink liquids after midnight Thursday, 04/03/17.  Take these medicines the morning of surgery with A SIP OF WATER : valACYclovir (Valtrex) .               Drink Ensure Pre-surgery drink before leaving your house morning of surgery.  Day of Surgery:  Do not wear jewelry, make-up or nail polish.             Do not wear lotions, powders, perfumes or deodorant.  Do not shave 48 hours prior to surgery.    Do not bring valuables to the hospital.  [facility] is not responsible for any belongings or valuables.  Contacts, dentures or bridgework may not be worn into surgery.  Leave your suitcase in the car.  After surgery it may be brought to your room.  For patients admitted to the hospital, discharge time will be determined by your treatment team.  Patients discharged the day of surgery will not be allowed to drive home.   [place] - Preparing for Surgery  Before surgery, you can play an important role.  Because skin is not sterile, your skin needs to be as free of germs as possible.  You can reduce the number of germs on you skin by washing with CHG (chlorahexidine gluconate) soap before surgery.  CHG is an antiseptic cleaner which kills germs and bonds with the skin to continue killing germs even after washing.  Please DO NOT use if you have an allergy to CHG or antibacterial soaps.  If your skin becomes reddened/irritated stop using the CHG and inform your nurse when you arrive at Short Stay.  Do not shave (including legs and underarms) for at least 48 hours prior to the first  CHG shower.  You may shave your face.  Please follow these instructions carefully:   1.  Shower with CHG Soap the night before surgery and the                    morning of Surgery.  2.  If you choose to wash your hair, wash your hair first as usual with your       normal shampoo.  3.  After you shampoo, rinse your hair and body thoroughly to remove the shampoo.  4.  Use CHG as you would any other liquid soap.  You can apply chg directly       to the skin and wash gently with scrungie or a clean washcloth.  5.  Apply the CHG Soap to your body ONLY FROM THE NECK DOWN.        Do not use on open wounds or open sores.  Avoid contact with your eyes, ears, mouth and genitals (private parts).  Wash genitals (private parts) with your normal soap.  6.  Wash thoroughly, paying special attention to the area where your surgery        will be performed.  7.  Thoroughly rinse your body with warm water from the neck down.  8.  DO NOT shower/wash with your normal soap after using and rinsing off       the CHG Soap.  9.  Pat yourself dry with a clean towel.            10.  Wear clean pajamas.            11.  Place clean sheets on your bed the night of your first shower and do not        sleep with pets.  Day of Surgery  Do not apply any lotions/deodorants the morning of surgery.  Please wear clean clothes to the hospital.   Please read over the fact sheets that you were given.

## 2017-04-02 ENCOUNTER — Telehealth: Payer: Self-pay | Admitting: *Deleted

## 2017-04-02 NOTE — Telephone Encounter (Signed)
Copied from [place] [id number]. Topic: General - Other >> Apr 02, 2017 11:03 AM Lennox Solders wrote: Reason for CRM: monica from [facility] is calling pt missed her physical therapy appt on 03-26-14

## 2017-04-03 ENCOUNTER — Other Ambulatory Visit: Payer: Self-pay | Admitting: Surgery

## 2017-04-03 ENCOUNTER — Inpatient Hospital Stay: Admission: RE | Admit: 2017-04-03 | Payer: Self-pay | Source: Ambulatory Visit

## 2017-04-03 DIAGNOSIS — C50911 Malignant neoplasm of unspecified site of right female breast: Secondary | ICD-10-CM

## 2017-04-03 NOTE — Telephone Encounter (Signed)
Please note
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
## 2017-04-04 ENCOUNTER — Other Ambulatory Visit: Payer: Self-pay | Admitting: Surgery

## 2017-04-04 ENCOUNTER — Encounter (HOSPITAL_COMMUNITY)
Admission: RE | Disposition: A | Discharge status: Skilled Nursing Facility | Payer: Self-pay | Source: Ambulatory Visit | Attending: Surgery

## 2017-04-04 ENCOUNTER — Ambulatory Visit: Payer: Self-pay | Admitting: Surgery

## 2017-04-04 ENCOUNTER — Telehealth: Payer: Self-pay | Admitting: Family Medicine

## 2017-04-04 ENCOUNTER — Ambulatory Visit (HOSPITAL_COMMUNITY): Payer: Medicare Other | Admitting: Certified Registered"

## 2017-04-04 ENCOUNTER — Encounter (HOSPITAL_COMMUNITY): Payer: Self-pay

## 2017-04-04 ENCOUNTER — Observation Stay (HOSPITAL_COMMUNITY)
Admission: RE | Admit: 2017-04-04 | Discharge: 2017-04-06 | Disposition: A | Discharge status: Skilled Nursing Facility | Payer: Medicare Other | Source: Ambulatory Visit | Attending: Surgery | Admitting: Surgery

## 2017-04-04 ENCOUNTER — Telehealth: Payer: Self-pay | Admitting: Internal Medicine

## 2017-04-04 ENCOUNTER — Inpatient Hospital Stay: Admission: RE | Admit: 2017-04-04 | Payer: Self-pay | Source: Ambulatory Visit

## 2017-04-04 ENCOUNTER — Ambulatory Visit (HOSPITAL_COMMUNITY): Payer: Medicare Other

## 2017-04-04 ENCOUNTER — Other Ambulatory Visit: Payer: Self-pay

## 2017-04-04 ENCOUNTER — Ambulatory Visit: Payer: Self-pay | Admitting: Oncology

## 2017-04-04 ENCOUNTER — Ambulatory Visit
Admit: 2017-04-04 | Discharge: 2017-04-04 | Disposition: A | Discharge status: Home / Self Care | Payer: Medicare Other | Attending: Surgery | Admitting: Surgery

## 2017-04-04 DIAGNOSIS — Z6841 Body Mass Index (BMI) 40.0 and over, adult: Secondary | ICD-10-CM | POA: Insufficient documentation

## 2017-04-04 DIAGNOSIS — R5381 Other malaise: Secondary | ICD-10-CM

## 2017-04-04 DIAGNOSIS — C7952 Secondary malignant neoplasm of bone marrow: Secondary | ICD-10-CM

## 2017-04-04 DIAGNOSIS — C50911 Malignant neoplasm of unspecified site of right female breast: Secondary | ICD-10-CM

## 2017-04-04 DIAGNOSIS — C773 Secondary and unspecified malignant neoplasm of axilla and upper limb lymph nodes: Secondary | ICD-10-CM | POA: Diagnosis not present

## 2017-04-04 DIAGNOSIS — C50811 Malignant neoplasm of overlapping sites of right female breast: Secondary | ICD-10-CM

## 2017-04-04 DIAGNOSIS — R2689 Other abnormalities of gait and mobility: Secondary | ICD-10-CM | POA: Diagnosis not present

## 2017-04-04 DIAGNOSIS — C7951 Secondary malignant neoplasm of bone: Secondary | ICD-10-CM | POA: Diagnosis not present

## 2017-04-04 DIAGNOSIS — G4700 Insomnia, unspecified: Secondary | ICD-10-CM | POA: Insufficient documentation

## 2017-04-04 DIAGNOSIS — Z79899 Other long term (current) drug therapy: Secondary | ICD-10-CM | POA: Insufficient documentation

## 2017-04-04 DIAGNOSIS — Z170 Estrogen receptor positive status [ER+]: Principal | ICD-10-CM

## 2017-04-04 DIAGNOSIS — Z853 Personal history of malignant neoplasm of breast: Secondary | ICD-10-CM

## 2017-04-04 HISTORY — PX: MASTECTOMY WITH AXILLARY LYMPH NODE DISSECTION: SHX5661

## 2017-04-04 HISTORY — PX: MASTECTOMY: SHX3

## 2017-04-04 HISTORY — PX: BILATERAL TOTAL MASTECTOMY WITH AXILLARY LYMPH NODE DISSECTION: SHX6364

## 2017-04-04 SURGERY — BILATERAL TOTAL MASTECTOMY WITH AXILLARY LYMPH NODE DISSECTION
Anesthesia: General | Site: Breast | Laterality: Bilateral

## 2017-04-04 MED ORDER — MIDAZOLAM HCL 2 MG/2ML IJ SOLN: 0.5000 mg | Freq: Once | INTRAMUSCULAR | Status: DC | PRN

## 2017-04-04 MED ORDER — FENTANYL CITRATE (PF) 100 MCG/2ML IJ SOLN
INTRAMUSCULAR | Status: DC | PRN
  Administered 2017-04-04: 25 ug via INTRAVENOUS
  Administered 2017-04-04: 100 ug via INTRAVENOUS
  Administered 2017-04-04 (×5): 25 ug via INTRAVENOUS

## 2017-04-04 MED ORDER — FENTANYL CITRATE (PF) 100 MCG/2ML IJ SOLN
INTRAMUSCULAR | Status: AC
  Administered 2017-04-04: 100 ug via INTRAVENOUS
  Filled 2017-04-04: qty 2

## 2017-04-04 MED ORDER — VALACYCLOVIR HCL 500 MG PO TABS
500.0000 mg | ORAL_TABLET | Freq: Every day | ORAL | Status: DC
  Administered 2017-04-05 – 2017-04-06 (×2): 500 mg via ORAL
  Filled 2017-04-04 (×2): qty 1

## 2017-04-04 MED ORDER — MIDAZOLAM HCL 2 MG/2ML IJ SOLN
INTRAMUSCULAR | Status: DC | PRN
  Administered 2017-04-04 (×2): 1 mg via INTRAVENOUS

## 2017-04-04 MED ORDER — METHYLENE BLUE 0.5 % INJ SOLN
INTRAVENOUS | Status: AC
  Filled 2017-04-04: qty 10

## 2017-04-04 MED ORDER — FENTANYL CITRATE (PF) 100 MCG/2ML IJ SOLN
100.0000 ug | Freq: Once | INTRAMUSCULAR | Status: AC
  Administered 2017-04-04: 100 ug via INTRAVENOUS

## 2017-04-04 MED ORDER — MIDAZOLAM HCL 2 MG/2ML IJ SOLN
2.0000 mg | Freq: Once | INTRAMUSCULAR | Status: AC
  Administered 2017-04-04: 2 mg via INTRAVENOUS

## 2017-04-04 MED ORDER — HYDROCODONE-ACETAMINOPHEN 5-325 MG PO TABS
1.0000 | ORAL_TABLET | ORAL | Status: DC | PRN
  Administered 2017-04-05 (×2): 1 via ORAL
  Filled 2017-04-04 (×2): qty 1

## 2017-04-04 MED ORDER — TECHNETIUM TC 99M SULFUR COLLOID FILTERED
1.0000 | Freq: Once | INTRAVENOUS | Status: AC | PRN
  Administered 2017-04-04: 1 via INTRADERMAL

## 2017-04-04 MED ORDER — ONDANSETRON HCL 4 MG/2ML IJ SOLN
INTRAMUSCULAR | Status: DC | PRN
  Administered 2017-04-04: 4 mg via INTRAVENOUS

## 2017-04-04 MED ORDER — MIDAZOLAM HCL 2 MG/2ML IJ SOLN
INTRAMUSCULAR | Status: AC
  Administered 2017-04-04: 2 mg via INTRAVENOUS
  Filled 2017-04-04: qty 2

## 2017-04-04 MED ORDER — HYDROMORPHONE HCL 1 MG/ML IJ SOLN: 0.2500 mg | INTRAMUSCULAR | Status: DC | PRN

## 2017-04-04 MED ORDER — ONDANSETRON HCL 4 MG/2ML IJ SOLN
INTRAMUSCULAR | Status: AC
  Filled 2017-04-04: qty 2

## 2017-04-04 MED ORDER — FENTANYL CITRATE (PF) 100 MCG/2ML IJ SOLN
INTRAMUSCULAR | Status: AC
  Administered 2017-04-04: 100 ug
  Filled 2017-04-04: qty 2

## 2017-04-04 MED ORDER — HEPARIN SODIUM (PORCINE) 5000 UNIT/ML IJ SOLN
5000.0000 [IU] | Freq: Three times a day (TID) | INTRAMUSCULAR | Status: DC
  Administered 2017-04-04 – 2017-04-06 (×5): 5000 [IU] via SUBCUTANEOUS
  Filled 2017-04-04 (×4): qty 1

## 2017-04-04 MED ORDER — MIDAZOLAM HCL 2 MG/2ML IJ SOLN
INTRAMUSCULAR | Status: AC
  Administered 2017-04-04: 2 mg
  Filled 2017-04-04: qty 2

## 2017-04-04 MED ORDER — CHLORHEXIDINE GLUCONATE CLOTH 2 % EX PADS: 6.0000 | MEDICATED_PAD | Freq: Once | CUTANEOUS | Status: DC

## 2017-04-04 MED ORDER — FENTANYL CITRATE (PF) 250 MCG/5ML IJ SOLN
INTRAMUSCULAR | Status: AC
  Filled 2017-04-04: qty 5

## 2017-04-04 MED ORDER — ONDANSETRON HCL 4 MG PO TABS: 4.0000 mg | ORAL_TABLET | Freq: Four times a day (QID) | ORAL | Status: DC | PRN

## 2017-04-04 MED ORDER — GABAPENTIN 300 MG PO CAPS
300.0000 mg | ORAL_CAPSULE | ORAL | Status: AC
  Administered 2017-04-04: 300 mg via ORAL
  Filled 2017-04-04: qty 1

## 2017-04-04 MED ORDER — ACETAMINOPHEN 500 MG PO TABS
1000.0000 mg | ORAL_TABLET | ORAL | Status: AC
  Administered 2017-04-04: 1000 mg via ORAL
  Filled 2017-04-04: qty 2

## 2017-04-04 MED ORDER — ONDANSETRON HCL 4 MG/2ML IJ SOLN: 4.0000 mg | Freq: Four times a day (QID) | INTRAMUSCULAR | Status: DC | PRN

## 2017-04-04 MED ORDER — SODIUM CHLORIDE 0.9 % IJ SOLN
INTRAVENOUS | Status: DC | PRN
  Administered 2017-04-04: 1.5 mL via INTRAMUSCULAR

## 2017-04-04 MED ORDER — ROPIVACAINE HCL 7.5 MG/ML IJ SOLN
INTRAMUSCULAR | Status: DC | PRN
  Administered 2017-04-04 (×2): 20 mL

## 2017-04-04 MED ORDER — TRAMADOL HCL 50 MG PO TABS: 50.0000 mg | ORAL_TABLET | Freq: Four times a day (QID) | ORAL | Status: DC | PRN

## 2017-04-04 MED ORDER — ONDANSETRON 4 MG PO TBDP: 4.0000 mg | ORAL_TABLET | Freq: Four times a day (QID) | ORAL | Status: DC | PRN

## 2017-04-04 MED ORDER — DEXAMETHASONE SODIUM PHOSPHATE 10 MG/ML IJ SOLN
INTRAMUSCULAR | Status: AC
  Filled 2017-04-04: qty 1

## 2017-04-04 MED ORDER — LACTATED RINGERS IV SOLN
INTRAVENOUS | Status: DC | PRN
  Administered 2017-04-04 (×2): via INTRAVENOUS

## 2017-04-04 MED ORDER — CEFAZOLIN SODIUM-DEXTROSE 2-4 GM/100ML-% IV SOLN
2.0000 g | INTRAVENOUS | Status: AC
  Administered 2017-04-04: 2 g via INTRAVENOUS
  Filled 2017-04-04: qty 100

## 2017-04-04 MED ORDER — MORPHINE SULFATE (PF) 4 MG/ML IV SOLN: 1.0000 mg | INTRAVENOUS | Status: DC | PRN

## 2017-04-04 MED ORDER — LIDOCAINE 2% (20 MG/ML) 5 ML SYRINGE
INTRAMUSCULAR | Status: DC | PRN
  Administered 2017-04-04: 20 mg via INTRAVENOUS

## 2017-04-04 MED ORDER — 0.9 % SODIUM CHLORIDE (POUR BTL) OPTIME
TOPICAL | Status: DC | PRN
  Administered 2017-04-04 (×6): 1000 mL

## 2017-04-04 MED ORDER — SODIUM CHLORIDE 0.9 % IJ SOLN
INTRAMUSCULAR | Status: AC
  Filled 2017-04-04: qty 10

## 2017-04-04 MED ORDER — PROMETHAZINE HCL 25 MG/ML IJ SOLN: 6.2500 mg | INTRAMUSCULAR | Status: DC | PRN

## 2017-04-04 MED ORDER — ONDANSETRON HCL 4 MG/2ML IJ SOLN
4.0000 mg | Freq: Once | INTRAMUSCULAR | Status: AC
  Administered 2017-04-04: 4 mg via INTRAVENOUS
  Filled 2017-04-04: qty 2

## 2017-04-04 MED ORDER — DEXAMETHASONE SODIUM PHOSPHATE 10 MG/ML IJ SOLN
INTRAMUSCULAR | Status: DC | PRN
  Administered 2017-04-04: 10 mg via INTRAVENOUS

## 2017-04-04 MED ORDER — LIDOCAINE 2% (20 MG/ML) 5 ML SYRINGE
INTRAMUSCULAR | Status: AC
  Filled 2017-04-04: qty 5

## 2017-04-04 MED ORDER — POTASSIUM CHLORIDE IN NACL 20-0.45 MEQ/L-% IV SOLN
INTRAVENOUS | Status: DC
  Administered 2017-04-04: 16:00:00 via INTRAVENOUS
  Administered 2017-04-05: 1 mL via INTRAVENOUS
  Administered 2017-04-05: 22:00:00 via INTRAVENOUS
  Filled 2017-04-04 (×6): qty 1000

## 2017-04-04 MED ORDER — MIDAZOLAM HCL 2 MG/2ML IJ SOLN
INTRAMUSCULAR | Status: AC
  Filled 2017-04-04: qty 2

## 2017-04-04 MED ORDER — PROPOFOL 10 MG/ML IV BOLUS
INTRAVENOUS | Status: AC
  Filled 2017-04-04: qty 20

## 2017-04-04 MED ORDER — PROPOFOL 10 MG/ML IV BOLUS
INTRAVENOUS | Status: DC | PRN
  Administered 2017-04-04: 125 mg via INTRAVENOUS

## 2017-04-04 MED ORDER — PROMETHAZINE HCL 25 MG RE SUPP
25.0000 mg | Freq: Four times a day (QID) | RECTAL | Status: DC | PRN
  Filled 2017-04-04: qty 1

## 2017-04-04 MED ORDER — PHENYLEPHRINE 40 MCG/ML (10ML) SYRINGE FOR IV PUSH (FOR BLOOD PRESSURE SUPPORT)
PREFILLED_SYRINGE | INTRAVENOUS | Status: DC | PRN
  Administered 2017-04-04: 80 ug via INTRAVENOUS

## 2017-04-04 MED ORDER — MEPERIDINE HCL 25 MG/ML IJ SOLN: 6.2500 mg | INTRAMUSCULAR | Status: DC | PRN

## 2017-04-04 SURGICAL SUPPLY — 53 items
APL SKNCLS STERI-STRIP NONHPOA (GAUZE/BANDAGES/DRESSINGS) ×1
APPLIER CLIP 9.375 MED OPEN (MISCELLANEOUS) ×2
APR CLP MED 9.3 20 MLT OPN (MISCELLANEOUS) ×1
ATCH SMKEVC FLXB CAUT HNDSWH (FILTER) ×1 IMPLANT
BENZOIN TINCTURE PRP APPL 2/3 (GAUZE/BANDAGES/DRESSINGS) ×2 IMPLANT
BINDER BREAST LRG (GAUZE/BANDAGES/DRESSINGS) IMPLANT
BINDER BREAST XLRG (GAUZE/BANDAGES/DRESSINGS) IMPLANT
BINDER BREAST XXLRG (GAUZE/BANDAGES/DRESSINGS) ×2 IMPLANT
CANISTER SUCT 3000ML PPV (MISCELLANEOUS) ×2 IMPLANT
CLIP APPLIE 9.375 MED OPEN (MISCELLANEOUS) ×1 IMPLANT
COVER SURGICAL LIGHT HANDLE (MISCELLANEOUS) ×2 IMPLANT
DEVICE DISSECT PLASMABLAD 3.0S (MISCELLANEOUS) ×1 IMPLANT
DRAIN CHANNEL 19F RND (DRAIN) ×4 IMPLANT
DRAPE CHEST BREAST 15X10 FENES (DRAPES) ×2 IMPLANT
DRAPE HALF SHEET 40X57 (DRAPES) ×4 IMPLANT
DRAPE UTILITY XL STRL (DRAPES) ×4 IMPLANT
DRSG PAD ABDOMINAL 8X10 ST (GAUZE/BANDAGES/DRESSINGS) ×2 IMPLANT
ELECT CAUTERY BLADE 6.4 (BLADE) ×2 IMPLANT
ELECT REM PT RETURN 9FT ADLT (ELECTROSURGICAL) ×4
ELECTRODE REM PT RTRN 9FT ADLT (ELECTROSURGICAL) ×2 IMPLANT
EVACUATOR SILICONE 100CC (DRAIN) ×4 IMPLANT
EVACUATOR SMOKE ACCUVAC VALLEY (FILTER) ×1
GAUZE SPONGE 4X4 12PLY STRL (GAUZE/BANDAGES/DRESSINGS) ×2 IMPLANT
GAUZE SPONGE 4X4 12PLY STRL LF (GAUZE/BANDAGES/DRESSINGS) ×2 IMPLANT
GLOVE SURG SIGNA 7.5 PF LTX (GLOVE) ×6 IMPLANT
GLOVE SURG SS PI 6.5 STRL IVOR (GLOVE) ×6 IMPLANT
GOWN STRL REUS W/ TWL LRG LVL3 (GOWN DISPOSABLE) IMPLANT
GOWN STRL REUS W/ TWL XL LVL3 (GOWN DISPOSABLE) ×2 IMPLANT
GOWN STRL REUS W/TWL LRG LVL3 (GOWN DISPOSABLE)
GOWN STRL REUS W/TWL XL LVL3 (GOWN DISPOSABLE) ×4
ILLUMINATOR WAVEGUIDE N/F (MISCELLANEOUS) IMPLANT
KIT BASIN OR (CUSTOM PROCEDURE TRAY) ×2 IMPLANT
KIT ROOM TURNOVER OR (KITS) ×2 IMPLANT
LIGHT WAVEGUIDE WIDE FLAT (MISCELLANEOUS) IMPLANT
NS IRRIG 1000ML POUR BTL (IV SOLUTION) ×2 IMPLANT
PACK GENERAL/GYN (CUSTOM PROCEDURE TRAY) ×2 IMPLANT
PAD ABD 8X10 STRL (GAUZE/BANDAGES/DRESSINGS) ×2 IMPLANT
PAD ARMBOARD 7.5X6 YLW CONV (MISCELLANEOUS) ×2 IMPLANT
PLASMABLADE 3.0S (MISCELLANEOUS) ×2
PREFILTER EVAC NS 1 1/3-3/8IN (MISCELLANEOUS) ×2 IMPLANT
SPECIMEN JAR X LARGE (MISCELLANEOUS) ×2 IMPLANT
SPONGE LAP 18X18 X RAY DECT (DISPOSABLE) IMPLANT
STAPLER VISISTAT 35W (STAPLE) ×2 IMPLANT
STRIP CLOSURE SKIN 1/2X4 (GAUZE/BANDAGES/DRESSINGS) ×2 IMPLANT
STRIP CLOSURE SKIN 1/4X4 (GAUZE/BANDAGES/DRESSINGS) ×2 IMPLANT
SUT ETHILON 2 0 FS 18 (SUTURE) ×4 IMPLANT
SUT MON AB 5-0 PS2 18 (SUTURE) ×2 IMPLANT
SUT SILK 3 0 (SUTURE)
SUT SILK 3-0 18XBRD TIE 12 (SUTURE) IMPLANT
SUT VIC AB 3-0 SH 18 (SUTURE) ×6 IMPLANT
TOWEL OR 17X24 6PK STRL BLUE (TOWEL DISPOSABLE) ×2 IMPLANT
TOWEL OR 17X26 10 PK STRL BLUE (TOWEL DISPOSABLE) ×2 IMPLANT
TUBE CONNECTING 12X1/4 (SUCTIONS) ×2 IMPLANT

## 2017-04-04 NOTE — Transfer of Care (Signed)
Immediate Anesthesia Transfer of Care Note  Patient: Jasmine Hood  Procedure(s) Performed: RIGHT MASTECTOMY WITH TARGETED RIGHT AXILLARY NODE DISSECTION AND LEFT PROPHYLATIC MASTECTOMY ERAS PATHWAY (Bilateral Breast)  Patient Location: PACU  Anesthesia Type:GA combined with regional for post-op pain  Level of Consciousness: awake and patient cooperative  Airway & Oxygen Therapy: Patient Spontanous Breathing and Patient connected to nasal cannula oxygen  Post-op Assessment: Report given to RN, Post -op Vital signs reviewed and stable and Patient moving all extremities  Post vital signs: Reviewed and stable  Last Vitals:  Vitals:   04/04/17 0800  BP: (!) 169/79  Pulse: 90  Resp: 20  Temp: 36.7 C  SpO2: 99%    Last Pain:  Vitals:   04/04/17 0800  TempSrc: Oral      Patients Stated Pain Goal: 0 (62/69/48 5462)  Complications: No apparent anesthesia complications

## 2017-04-04 NOTE — Anesthesia Procedure Notes (Addendum)
Anesthesia Regional Block: Pectoralis block   Pre-Anesthetic Checklist: ,, timeout performed, Correct Patient, Correct Site, Correct Laterality, Correct Procedure, Correct Position, site marked, Risks and benefits discussed,  Surgical consent,  Pre-op evaluation,  At surgeon's request and post-op pain management  Laterality: Left  Prep: chloraprep       Needles:   Needle Type: Echogenic Needle     Needle Length: 9cm  Needle Gauge: 21     Additional Needles:   Procedures:,,,, ultrasound used (permanent image in chart),,,,  Narrative:  Start time: 04/04/2017 9:25 AM End time: 04/04/2017 9:37 AM Injection made incrementally with aspirations every 5 mL.  Performed by: Personally  Anesthesiologist: Annye Asa, MD  Additional Notes: Pt identified in Holding room.  Monitors applied. Working IV access confirmed. Sterile prep, drape L clavicle and pec.  #21ga ECHOgenic needle between pec minor and serratus, between ribs 4,5 with US guidance.  20cc 0.75% Ropivacaine injected incrementally after negative test dose.  Patient asymptomatic, VSS, no heme aspirated, tolerated well.  Jenita Seashore, MD

## 2017-04-04 NOTE — Telephone Encounter (Signed)
Copied from [place] [id number]. Topic: General - Other >> Apr 04, 2017  2:30 PM Carolyn Stare wrote:   [name] with [name] call to ask for verbal orders to discharge pt    [phone]

## 2017-04-04 NOTE — Progress Notes (Signed)
Report given to lauren rn as caregiver

## 2017-04-04 NOTE — Interval H&P Note (Signed)
History and Physical Interval Note:  04/04/2017 9:24 AM  Jasmine Hood  has presented today for surgery, with the diagnosis of Right breast cancer  The various methods of treatment have been discussed with the patient and family.  [name] with patient.  She did not get the seed yesterday, but will go ahead with surgery.  After consideration of risks, benefits and other options for treatment, the patient has consented to  Procedure(s) with comments: [place]ger (Bilateral) - PEC BLOCK as a surgical intervention .  The patient's history has been reviewed, patient examined, no change in status, stable for surgery.  I have reviewed the patient's chart and labs.  Questions were answered to the patient's satisfaction.     Shann Medal

## 2017-04-04 NOTE — Telephone Encounter (Signed)
Order for DME for hospital bed placed with [facility]. Message sent to Darlina Guys to advise.

## 2017-04-04 NOTE — Op Note (Signed)
04/04/2017  1:39 PM  PATIENT:  Jasmine Hood, 71 y.o., female, MRN: [id number]  PREOP DIAGNOSIS:  Right breast cancer  POSTOP DIAGNOSIS:   Right breast cancer, lateral breast.  Apparent extensive cancer involving her right axilla.  PROCEDURE:   Procedure(s):  RIGHT MASTECTOMY WITH right axillary node dissection (right modified radical mastectomy) AND LEFT PROPHYLATIC MASTECTOMY  SURGEON:   Jasmine Hood, M.D.  ASSISTANT:   None  ANESTHESIA:   general  Anesthesiologist: Jasmine Asa, MD CRNA: Jasmine Favia, CRNA; Jasmine Salisbury, CRNA  General  Hood:  3   EBL:  300  ml  BLOOD ADMINISTERED: none  DRAINS: none   LOCAL MEDICATIONS USED:   Bilateral pectoral blocks by anesthesia  SPECIMEN:   Right breast and axillary contents(suture lateral), Right breast superior mastectomy skin (long suture lateral, short suture inferior), left breast  (suture lateral), left breast superior mastectomy skin (long suture lateral, short suture inferior)   COUNTS CORRECT:  YES  INDICATIONS FOR PROCEDURE:  Jasmine Hood is a 71 y.o. (DOB: [birth year]) white female whose primary care physician is Jasmine Lor, MD and comes for right mastectomy with targeted right axillary dissection and left prophylactic mastectomy.   The indications and risks of the surgery were explained to the patient.  The risks include, but are not limited to, infection, bleeding, and nerve injury.   Preop, anesthesia did bilateral pectoral muscle blocks.  And nuclear medicine came and injected with 1 millicurie of Technitium Sulfur Colloid around her right nipple.  OPERATIVE NOTE;  The patient was taken to OR room # 9 at [facility] where she underwent a general anesthesia  supervised by Anesthesiologist: Jasmine Asa, MD CRNA: Jasmine Favia, CRNA; Jasmine Salisbury, CRNA. Both her breast and axilla were prepped with ChloraPrep and sterilely draped.    A time-out and the surgical check list was  reviewed.    I injected about 0.5 mL of 40% methylene blue around her right areola.   I made an elliptical incision including the areola in the left breast (the benign side).  I developed skin flaps medially to the lateral edge of the sternum, inferiorly to the investing fascia of the rectus abdominus muscle, laterally to the anterior edge of the latissimus dorsi muscle, and superiorly to about 2 finger breaths below the clavicle.  I did not get into the left power port wound. The breast was reflected off the pectoralis muscle from medial to lateral.  The lateral attachments in the left axilla were divided and the breast removed.  A long suture was placed on the lateral aspect of the breast.  I then left damp packs in the wound until I completed the opposite side.   I made an elliptical incision including the areola in the right breast.  I developed skin flaps medially to the lateral edge of the sternum, inferiorly to the investing fascia of the rectus abdominus muscle, laterally to the anterior edge of the latissimus dorsi muscle, and superiorly to about 2 finger breaths below the clavicle.  The breast was reflected off the pectoralis muscle from medial to lateral.     I dissected into her right axilla.  I tried to find a sentinel lymph node and though I found a warm area (counts 20, background 0), as I dissected into the axilla, it was clear the she has extensive axillary nodal disease.  Her axillary vein was encased in tumor and this was the limit of my cranial dissection.  I dissected out level 2 nodes behind the pectoralis major, but it was clear that I was leaving tumor behind.  I identified the thoracodorsal nerve and long thoracic nerve of Bell and tried to preserve these during the dissection.  A long suture was placed on the lateral aspect of the breast.   I brought out 2 19 F Blake drains below the inferior flaps below the right and left mastectomy wounds, so she has a total of 4 drains.  These  were sewn in place with 2-0 Nylons.  I irrigated the wound with 4,000 cc of fluid.   The skin was closed with skin staples.   A pressure dressing was placed on the wound and the chest wrapped with a breast binder.  Her needle and sponge count were correct at the end of the case.   She was transferred to the recovery room in good condition.  Jasmine Overall, MD, [facility] Pager: [phone] Office phone:  [phone]

## 2017-04-04 NOTE — Progress Notes (Signed)
1550 Received patient from PACU following bilateral mastectomy. Alert, oriented x4. Patient's dressings are clean, dry, and intact. Patient denies pain at this time. Resting comfortably.

## 2017-04-04 NOTE — Anesthesia Procedure Notes (Signed)
Anesthesia Regional Block: Pectoralis block   Pre-Anesthetic Checklist: ,, timeout performed, Correct Patient, Correct Site, Correct Laterality, Correct Procedure, Correct Position, site marked, Risks and benefits discussed,  Surgical consent,  Pre-op evaluation,  At surgeon's request and post-op pain management  Laterality: Right  Prep: chloraprep       Needles:   Needle Type: Echogenic Needle     Needle Length: 9cm  Needle Gauge: 21     Additional Needles:   Procedures:,,,, ultrasound used (permanent image in chart),,,,  Narrative:  Start time: 04/04/2017 9:37 AM End time: 04/04/2017 9:41 AM Injection made incrementally with aspirations every 5 mL.  Performed by: Personally  Anesthesiologist: Annye Asa, MD  Additional Notes: Pt identified in Holding room.  Monitors applied. Working IV access confirmed. Sterile prep, drape R clavicle and pec.  #21 ga ECHOgenic needle between pec minor and serratus, between ribs 4,5 with US guidance.  20cc 0.75% Ropivacaine injected incrementally after negative test dose.  Patient asymptomatic, VSS, no heme aspirated, tolerated well.  Jenita Seashore, MD

## 2017-04-04 NOTE — Discharge Instructions (Signed)
[facility] [place] [facility] - DISCHARGE INSTRUCTIONS TO PATIENT  Activity:  Driving - May drive in 4 or 5 days, if doing well   Lifting - No lifting more than 15 pounds for 10 days, then no limit  Wound Care:   Empty drains twice a day.  Record the amount of drainage.  Bring that record with you to the office.             May remove breast binder and shower in 3 days.  Diet:  As tolerated  Follow up appointment:   You have an appointment for Wednesday, 04/09/17, 10:00 AM. Call Dr. Pollie Friar office [facility]) at [phone] for any questions about the appointment.  Medications and dosages:  Resume your home medications.  You have a prescription for:  Vicodin  Call Dr. Lucia Gaskins or his office  [phone]) if you have:  Temperature greater than 100.4,  Persistent nausea and vomiting,  Severe uncontrolled pain,  Redness, tenderness, or signs of infection (pain, swelling, redness, odor or green/yellow discharge around the site),  Difficulty breathing, headache or visual disturbances,  Any other questions or concerns you may have after discharge.  In an emergency, call 911 or go to an Emergency Department at a nearby hospital.

## 2017-04-04 NOTE — Anesthesia Postprocedure Evaluation (Signed)
Anesthesia Post Note  Patient: ARUNA NESTLER  Procedure(s) Performed: RIGHT MASTECTOMY WITH TARGETED RIGHT AXILLARY NODE DISSECTION AND LEFT PROPHYLATIC MASTECTOMY ERAS PATHWAY (Bilateral Breast)     Patient location during evaluation: PACU Anesthesia Type: General and Regional Level of consciousness: awake and alert, patient cooperative and oriented Pain management: pain level controlled Vital Signs Assessment: post-procedure vital signs reviewed and stable Respiratory status: spontaneous breathing, nonlabored ventilation, respiratory function stable and patient connected to nasal cannula oxygen Cardiovascular status: blood pressure returned to baseline and stable Postop Assessment: no apparent nausea or vomiting Anesthetic complications: no    Last Vitals:  Vitals:   04/04/17 1500 04/04/17 1515  BP: 138/76 140/80  Pulse: 79 80  Resp: 19 (!) 22  Temp:  (!) 36.3 C  SpO2: 100% 100%    Last Pain:  Vitals:   04/04/17 1515  TempSrc:   PainSc: Asleep                 Sahmya Arai,E. Chrishon Martino

## 2017-04-04 NOTE — Telephone Encounter (Signed)
Sister is not on DPR, so not able to disclose PHI to her.  Returned call and recommended she discuss discharge planning with patient's hospital doctors. She plans to follow-up with them.  She also states that patient never received hospital bed that Dr. Raliegh Ip ordered on 03/04/17 and is requesting new order. Sheena, can you help with this?

## 2017-04-04 NOTE — Anesthesia Preprocedure Evaluation (Addendum)
Anesthesia Evaluation  Patient identified by MRN, date of birth, ID band Patient awake    Reviewed: Allergy & Precautions, NPO status , Patient's Chart, lab work & pertinent test results  History of Anesthesia Complications Negative for: history of anesthetic complications  Airway Mallampati: II  TM Distance: >3 FB Neck ROM: Full    Dental  (+) Poor Dentition, Dental Advisory Given, Missing   Pulmonary sleep apnea (no CPAP, not fully eval yet) ,    breath sounds clear to auscultation       Cardiovascular hypertension, (-) angina Rhythm:Regular Rate:Normal     Neuro/Psych CVA (found incidentally on CT), No Residual Symptoms    GI/Hepatic negative GI ROS, Neg liver ROS,   Endo/Other  Morbid obesity  Renal/GU      Musculoskeletal   Abdominal (+) + obese,   Peds  Hematology negative hematology ROS (+)   Anesthesia Other Findings Breast cancer: chemo  Reproductive/Obstetrics                            Anesthesia Physical Anesthesia Plan  ASA: III  Anesthesia Plan: General   Post-op Pain Management: GA combined w/ Regional for post-op pain   Induction: Intravenous  PONV Risk Score and Plan: 3 and Ondansetron, Dexamethasone and Midazolam  Airway Management Planned: LMA  Additional Equipment:   Intra-op Plan:   Post-operative Plan:   Informed Consent: I have reviewed the patients History and Physical, chart, labs and discussed the procedure including the risks, benefits and alternatives for the proposed anesthesia with the patient or authorized representative who has indicated his/her understanding and acceptance.   Dental advisory given  Plan Discussed with: CRNA and Surgeon  Anesthesia Plan Comments: (Plan routine monitors, GA- LMA OK, bilateral pectoralis blocks for post op analgesia)        Anesthesia Quick Evaluation

## 2017-04-04 NOTE — Telephone Encounter (Signed)
Copied from [place] [id number]. Topic: Quick Communication - See Telephone Encounter >> Apr 04, 2017  3:59 PM Arletha Grippe wrote: CRM for notification. See Telephone encounter for:   04/04/17. Pt has had breast cancer surgery, and pt needs to go to rehab and needs hospital bed.  The hospital is trying to discharge patient and send her home, not to rehab. Dr Lucia Gaskins did the surgery and Dr Melanie Crazier is her cancer doctor.  Sister is asking for orders to go to rehab.  Sister callback number is [phone]

## 2017-04-04 NOTE — Anesthesia Procedure Notes (Signed)
Procedure Name: LMA Insertion Date/Time: 04/04/2017 10:14 AM Performed by: Moshe Salisbury, CRNA Pre-anesthesia Checklist: Patient identified, Emergency Drugs available, Suction available and Patient being monitored Patient Re-evaluated:Patient Re-evaluated prior to induction Oxygen Delivery Method: Circle System Utilized Preoxygenation: Pre-oxygenation with 100% oxygen Induction Type: IV induction Ventilation: Mask ventilation without difficulty LMA: LMA inserted LMA Size: 4.0 Number of attempts: 1 Placement Confirmation: positive ETCO2 Tube secured with: Tape Dental Injury: Teeth and Oropharynx as per pre-operative assessment

## 2017-04-05 ENCOUNTER — Encounter (HOSPITAL_COMMUNITY): Payer: Self-pay | Admitting: Surgery

## 2017-04-05 DIAGNOSIS — C50911 Malignant neoplasm of unspecified site of right female breast: Secondary | ICD-10-CM | POA: Diagnosis not present

## 2017-04-05 MED ORDER — HYDROCODONE-ACETAMINOPHEN 5-325 MG PO TABS: 1.0000 | ORAL_TABLET | Freq: Four times a day (QID) | ORAL | 0 refills | Status: DC | PRN

## 2017-04-05 MED ORDER — SODIUM CHLORIDE 0.9% FLUSH
10.0000 mL | INTRAVENOUS | Status: DC | PRN
  Administered 2017-04-05: 10 mL
  Filled 2017-04-05: qty 40

## 2017-04-05 NOTE — Progress Notes (Signed)
Pt for discharge today but she refused because she wants to transfer to rehab, her daughter cannot take care of her due to mental illness, on call MD, Dr. Ninfa Linden said she can stay here today, informed the charge nurse Hassan Rowan.

## 2017-04-05 NOTE — Progress Notes (Signed)
1 Day Post-Op   Subjective/Chief Complaint: No real complaints other than some mild soreness   Objective: Vital signs in last 24 hours: Temp:  [97.3 F (36.3 C)-98 F (36.7 C)] 97.9 F (36.6 C) (11/17 0507) Pulse Rate:  [72-88] 72 (11/17 0507) Resp:  [15-22] 19 (11/17 0507) BP: (130-154)/(59-87) 132/59 (11/17 0507) SpO2:  [96 %-100 %] 96 % (11/17 0507) Weight:  [103.9 kg (229 lb 1.6 oz)] 103.9 kg (229 lb 1.6 oz) (11/16 0818) Last BM Date: 04/04/17  Intake/Output from previous day: 11/16 0701 - 11/17 0700 In: 2451.3 [P.O.:120; I.V.:2231.3; IV Piggyback:100] Out: 610 [Urine:50; Drains:260; Blood:300] Intake/Output this shift: No intake/output data recorded.  General appearance: alert and cooperative Resp: clear to auscultation bilaterally Chest wall: skin flaps look ok Cardio: regular rate and rhythm GI: soft, non-tender; bowel sounds normal; no masses,  no organomegaly  Lab Results:  No results for input(s): WBC, HGB, HCT, PLT in the last 72 hours. BMET No results for input(s): NA, K, CL, CO2, GLUCOSE, BUN, CREATININE, CALCIUM in the last 72 hours. PT/INR No results for input(s): LABPROT, INR in the last 72 hours. ABG No results for input(s): PHART, HCO3 in the last 72 hours.  Invalid input(s): PCO2, PO2  Studies/Results: Nm Sentinel Node Inj-no Rpt (breast)  Result Date: 04/04/2017 Sulfur colloid was injected by the nuclear medicine technologist for melanoma sentinel node.   Mm Breast Surgical Specimen  Result Date: 04/04/2017 CLINICAL DATA:  Specimen radiograph was performed of the patient's right mastectomy and axillary node dissection EXAM: SPECIMEN RADIOGRAPH OF THE RIGHT BREAST COMPARISON:  Previous exam(s). FINDINGS: Status post right mastectomy. The 2 breast biopsy clips, including a coil clip and ribbon shaped clip, are seen within the specimen. Additionally, a spiral HydroMARK clip that was placed in the right axilla is present in the specimen. IMPRESSION:  Specimen radiograph of the right breast. Electronically Signed   By: Curlene Dolphin M.D.   On: 04/04/2017 12:49    Anti-infectives: Anti-infectives (From admission, onward)   Start     Dose/Rate Route Frequency Ordered Stop   04/05/17 1000  valACYclovir (VALTREX) tablet 500 mg     500 mg Oral Daily 04/04/17 1550     04/04/17 0751  ceFAZolin (ANCEF) IVPB 2g/100 mL premix     2 g 200 mL/hr over 30 Minutes Intravenous On call to O.R. 04/04/17 0751 04/04/17 0959      Assessment/Plan: s/p Procedure(s) with comments: RIGHT MASTECTOMY WITH TARGETED RIGHT AXILLARY NODE DISSECTION AND LEFT PROPHYLATIC MASTECTOMY ERAS PATHWAY (Bilateral) - PEC BLOCK Advance diet Discharge  LOS: 0 days    TOTH III,Clemons Salvucci S 04/05/2017

## 2017-04-05 NOTE — Care Management Note (Signed)
Case Management Note  Patient Details  Name: Jasmine Hood MRN: [id number] Date of Birth: 06-16-1945  Subjective/Objective: 71 y.o. F   s/p Right Mastectomy with targeted Right Axillary Node dissection and L prophylactic Mastectomy Eras Pathway. Refusing to be discharged 2/2 inability to walk and "empty drains" pt states she has had chemo recently and has been deconditioned. Sister (estranged x 5 yrs) until Cancer diagnosis agrees. Pt has MR daughter at home and another 107 yo daughter who is pts POA. (?)  Final decision after much ado is PT eval with DME and family will gather resources overnight with discharge to home/facility on 04/06/2017                  Action/Plan:CM will follow closely for disposition/discharge needs.    Expected Discharge Date:                  Expected Discharge Plan:     In-House Referral:     Discharge planning Services  CM Consult  Post Acute Care Choice:  Home Health, Durable Medical Equipment Choice offered to:  Patient, Sibling(Sister at bedside)  DME Arranged:    DME Agency:     HH Arranged:    [place] Agency:     Status of Service:  In process, will continue to follow  If discussed at Long Length of Stay Meetings, dates discussed:    Additional Comments:  Delrae Sawyers, RN 04/05/2017, 3:22 PM

## 2017-04-06 DIAGNOSIS — C50911 Malignant neoplasm of unspecified site of right female breast: Secondary | ICD-10-CM | POA: Diagnosis not present

## 2017-04-06 NOTE — Clinical Social Work Placement (Signed)
   CLINICAL SOCIAL WORK PLACEMENT  NOTE  Date:  04/06/2017  Patient Details  Name: Jasmine Hood MRN: [id number] Date of Birth: [birth year]  Clinical Social Work is seeking post-discharge placement for this patient at the [place] level of care (*CSW will initial, date and re-position this form in  chart as items are completed):  Yes   Patient/family provided with [place] Work Department's list of facilities offering this level of care within the geographic area requested by the patient (or if unable, by the patient's family).  Yes   Patient/family informed of their freedom to choose among providers that offer the needed level of care, that participate in Medicare, Medicaid or managed care program needed by the patient, have an available bed and are willing to accept the patient.  Yes   Patient/family informed of [place]'s ownership interest in [facility] and [facility], as well as of the fact that they are under no obligation to receive care at these facilities.  PASRR submitted to EDS on 04/06/17     PASRR number received on 04/06/17     Existing PASRR number confirmed on       FL2 transmitted to all facilities in geographic area requested by pt/family on 04/06/17     FL2 transmitted to all facilities within larger geographic area on       Patient informed that his/her managed care company has contracts with or will negotiate with certain facilities, including the following:  [facility]     Yes   Patient/family informed of bed offers received.  Patient chooses bed at [facility]     Physician recommends and patient chooses bed at      Patient to be transferred to [facility] on 04/06/17.  Patient to be transferred to facility by Ambulance     Patient family notified on 04/06/17 of transfer.  Name of family member notified:  Patient to notify family     PHYSICIAN Please prepare priority discharge summary, including  medications     Additional Comment:   Barbette Or, [place]

## 2017-04-06 NOTE — Evaluation (Signed)
Physical Therapy Evaluation Patient Details Name: Jasmine Hood MRN: [id number] DOB: 01-Feb-1946 Today's Date: 04/06/2017   History of Present Illness  Admitted with R breast Cancer, now s/p RIGHT MASTECTOMY WITH right axillary node dissection (right modified radical mastectomy) AND LEFT PROPHYLATIC MASTECTOMY;  has a pertinent past medical history of Breast cancer ([place]) (10/07/2016), [place] recluse spider bite,  Complication of anesthesia, Constipation,  HYPOTHYROIDISM (02/01/2010), Morbid obesity ([place]) (01/05/2009), Sleep apnea, and Stroke ([place]).  Clinical Impression   Patient is s/p above surgery resulting in functional limitations due to the deficits listed below (see PT Problem List). Currently requiring +2 assist for bed mobility; Once up on her feet, moving better; currently requiring RW; PT is agreeable to short-term rehab at SNF to maximize independence and safety with mobility prior to dc home;  Patient will benefit from skilled PT to increase their independence and safety with mobility to allow discharge to the venue listed below.       Follow Up Recommendations SNF;Supervision/Assistance - 24 hour    Equipment Recommendations  Rolling walker with 5" wheels;3in1 (PT)    Recommendations for Other Services       Precautions / Restrictions Precautions Precautions: Fall Precaution Comments: 4 drains      Mobility  Bed Mobility Overal bed mobility: Needs Assistance Bed Mobility: Supine to Sit     Supine to sit: +2 for physical assistance;Max assist     General bed mobility comments: Cues for technique; max assist to elevate trunk to sit and used bed pad to square off hips at EOB  Transfers Overall transfer level: Needs assistance Equipment used: Rolling walker (2 wheeled) Transfers: Sit to/from Stand Sit to Stand: Min assist         General transfer comment: Cues for hand placement and safety  Ambulation/Gait Ambulation/Gait assistance: Min assist Ambulation  Distance (Feet): 25 Feet Assistive device: Rolling walker (2 wheeled) Gait Pattern/deviations: Step-through pattern;Decreased step length - right;Decreased step length - left Gait velocity: Slow   General Gait Details: Cues to self-monitor for activity tolerance  Stairs            Wheelchair Mobility    Modified Rankin (Stroke Patients Only)       Balance Overall balance assessment: Needs assistance Sitting-balance support: Bilateral upper extremity supported;Feet supported Sitting balance-Leahy Scale: Fair       Standing balance-Leahy Scale: Poor                               Pertinent Vitals/Pain Pain Assessment: Faces Faces Pain Scale: Hurts little more Pain Location: R chest, axilla, back, especially with bed mobility Pain Descriptors / Indicators: Aching;Grimacing;Guarding;Operative site guarding Pain Intervention(s): Monitored during session    Home Living Family/patient expects to be discharged to:: Private residence Living Arrangements: Children Available Help at Discharge: Family;Friend(s)(Daughter who lives with her cannot care for pt) Type of Home: Mobile home Home Access: Stairs to enter Entrance Stairs-Rails: Psychiatric nurse of Steps: 4 Home Layout: One level Home Equipment: Bedside commode;Hand held shower head      Prior Function Level of Independence: Needs assistance   Gait / Transfers Assistance Needed: Pt ambulating ltd distance with assist of dtr  ADL's / Homemaking Assistance Needed: daughter assists as needed  Comments: pt states she can usually do self care alone     Hand Dominance   Dominant Hand: Right    Extremity/Trunk Assessment   Upper Extremity Assessment Upper Extremity Assessment:  Generalized weakness;RUE deficits/detail;LUE deficits/detail RUE Deficits / Details: Limited RUE use due to pain, drains LUE Deficits / Details: Limited LUE use due to pain, drains    Lower Extremity  Assessment Lower Extremity Assessment: Generalized weakness       Communication   Communication: No difficulties  Cognition Arousal/Alertness: Awake/alert Behavior During Therapy: WFL for tasks assessed/performed Overall Cognitive Status: Within Functional Limits for tasks assessed                                        General Comments General comments (skin integrity, edema, etc.): Pt is very anxious about her drains    Exercises     Assessment/Plan    PT Assessment Patient needs continued PT services  PT Problem List Decreased strength;Decreased range of motion;Decreased activity tolerance;Decreased balance;Decreased mobility;Decreased knowledge of use of DME;Decreased knowledge of precautions;Pain       PT Treatment Interventions DME instruction;Gait training;Stair training;Functional mobility training;Therapeutic activities;Therapeutic exercise;Balance training;Neuromuscular re-education;Cognitive remediation;Patient/family education    PT Goals (Current goals can be found in the Care Plan section)  Acute Rehab PT Goals Patient Stated Goal: Agreeable to walk PT Goal Formulation: With patient Time For Goal Achievement: 04/20/17 Potential to Achieve Goals: Good    Frequency Min 3X/week   Barriers to discharge Decreased caregiver support      Co-evaluation               AM-PAC PT "6 Clicks" Daily Activity  Outcome Measure Difficulty turning over in bed (including adjusting bedclothes, sheets and blankets)?: Unable Difficulty moving from lying on back to sitting on the side of the bed? : Unable Difficulty sitting down on and standing up from a chair with arms (e.g., wheelchair, bedside commode, etc,.)?: Unable Help needed moving to and from a bed to chair (including a wheelchair)?: A Little Help needed walking in hospital room?: A Little Help needed climbing 3-5 steps with a railing? : A Lot 6 Click Score: 11    End of Session   Activity  Tolerance: Patient tolerated treatment well Patient left: in chair;with call bell/phone within reach Nurse Communication: Mobility status PT Visit Diagnosis: Other abnormalities of gait and mobility (R26.89);Pain Pain - Right/Left: (at Mastectomy sites) Pain - part of body: (Bil mastectomies)    Time: 4193-7902 PT Time Calculation (min) (ACUTE ONLY): 16 min   Charges:   PT Evaluation $PT Eval Moderate Complexity: 1 Mod     PT G Codes:   PT G-Codes **NOT FOR INPATIENT CLASS** Functional Assessment Tool Used: Clinical judgement Functional Limitation: Mobility: Walking and moving around Mobility: Walking and Moving Around Current Status (I0973): At least 20 percent but less than 40 percent impaired, limited or restricted Mobility: Walking and Moving Around Goal Status [phone]): 0 percent impaired, limited or restricted    Roney Marion, PT  [place] Pager [phone] Office [place] 04/06/2017, 11:36 AM

## 2017-04-06 NOTE — Clinical Social Work Note (Signed)
Clinical Social Worker facilitated patient discharge including contacting patient family and facility to confirm patient discharge plans.  Clinical information faxed to facility and family agreeable with plan.  CSW arranged ambulance transport via [place] to [facility] [phone]).  RN to call report prior to discharge.  Clinical Social Worker will sign off for now as social work intervention is no longer needed. Please consult [place] again if new need arises.  Barbette Or, [place]

## 2017-04-06 NOTE — Discharge Summary (Signed)
Physician Discharge Summary  Patient ID: ELWANDA MOGER MRN: [id number] DOB/AGE: 73/06/1945 73 y.o.  Admit date: 04/04/2017 Discharge date: 04/06/2017  Admission Diagnoses:  Discharge Diagnoses:  Active Problems:   Breast cancer, stage 4, right [facility])   Discharged Condition: good  Hospital Course: Admitted after bilateral mastectomies, one therapeutic, one prophylactic.  Sentinel LN biopsy.  She is doing well.  Understands drain care and can perform without difficulty.  Has little assistance at home.  After careful consideration and observation it was determined that discharge to a skilled nursing facility would be most appropriate.  Consults: None  Significant Diagnostic Studies: None  Treatments: IV hydration and surgery: as previously described.  Discharge Exam: Blood pressure 124/60, pulse 88, temperature 98.2 F (36.8 C), temperature source Oral, resp. rate 17, height 4' 11.5" (1.511 m), weight 103.9 kg (229 lb 1.6 oz), SpO2 97 %. General appearance: alert and no distress Breasts: Normal postoperative changes by report  Disposition: 01-Home or Self Care   Allergies as of 04/06/2017      Reactions   Bee Venom Anaphylaxis      Medication List    TAKE these medications   dexamethasone 4 MG tablet Commonly known as:  DECADRON Take 2 tablets (8 mg total) by mouth daily. Start the day after chemotherapy for 2 days. Take with food.   EPINEPHrine 0.3 mg/0.3 mL Soaj injection Commonly known as:  EPIPEN 2-PAK Inject 0.3 mLs (0.3 mg total) into the muscle once. What changed:    when to take this  reasons to take this  additional instructions   fluconazole 100 MG tablet Commonly known as:  DIFLUCAN Take 2 tablets (200 mg total) by mouth daily. Take for 5 days following treatment, every 21 days What changed:  additional instructions   HYDROcodone-acetaminophen 5-325 MG tablet Commonly known as:  NORCO/VICODIN Take 1-2 tablets every 6 (six) hours as needed by  mouth for moderate pain.   lidocaine-prilocaine cream Commonly known as:  EMLA Apply 1 application topically as needed. What changed:    when to take this  additional instructions   magic mouthwash w/lidocaine Soln Take 5 mLs by mouth 4 (four) times daily as needed for mouth pain.   ondansetron 4 MG tablet Commonly known as:  ZOFRAN Take 1 tablet (4 mg total) by mouth every 6 (six) hours as needed for nausea.   promethazine 25 MG suppository Commonly known as:  PHENERGAN Place 1 suppository (25 mg total) rectally every 6 (six) hours as needed for nausea or vomiting.   triamcinolone ointment 0.5 % Commonly known as:  KENALOG Apply 1 application topically 2 (two) times daily.   valACYclovir 500 MG tablet Commonly known as:  VALTREX Take 1 tablet (500 mg total) by mouth daily.      Follow-up Information    Alphonsa Overall, MD Follow up in 2 week(s).   Specialty:  General Surgery Contact information: [place] [address] [phone]           Signed: Judeth Horn 04/06/2017, 4:56 PM

## 2017-04-06 NOTE — NC FL2 (Signed)
[place] LEVEL OF CARE SCREENING TOOL     IDENTIFICATION  Patient Name: Jasmine Hood Birthdate: Aug 15, 1945 Sex: female Admission Date (Current Location): 04/04/2017  [facility] and [place] Number:  Herbalist and Address:  [facility], [place] [address], [place], [place] [address]      Provider Number: [id number]  Attending Physician Name and Address:  Alphonsa Overall, MD  Relative Name and Phone Number:       Current Level of Care: Hospital Recommended Level of Care: [place] Prior Approval Number:    Date Approved/Denied:   PASRR Number: [id number]  Discharge Plan: SNF    Current Diagnoses: Patient Active Problem List   Diagnosis Date Noted  . Breast cancer, stage 4, right ([place]) 04/04/2017  . Acute lower UTI 02/20/2017  . Elevated lactic acid level   . Urinary tract infection without hematuria   . Weakness   . Generalized weakness 02/19/2017  . AKI (acute kidney injury) ([place]) 02/12/2017  . Dehydration 02/12/2017  . Abnormal TSH 02/12/2017  . Genetic testing 11/27/2016  . Port catheter in place 11/14/2016  . Bone metastases ([place]) 11/11/2016  . Morbid obesity with BMI of 50.0-59.9, adult ([place]) 11/05/2016  . Cirrhosis of liver ([place]) 11/05/2016  . Malignant neoplasm of overlapping sites of right breast in female, estrogen receptor positive ([place]) 10/21/2016  . Encounter for follow-up surveillance of colon cancer   . Benign neoplasm of ascending colon   . Benign neoplasm of descending colon   . Benign neoplasm of transverse colon   . Benign neoplasm of cecum   . OSA (obstructive sleep apnea) 09/14/2012  . Contact dermatitis and eczema due to plant 12/31/2011  . Hypothyroidism 02/01/2010  . Essential hypertension 01/05/2009  . History of colonic polyps 01/05/2009    Orientation RESPIRATION BLADDER Height & Weight     Self, Time, Situation, Place  Normal Continent Weight: 229 lb 1.6 oz (103.9  kg) Height:  4' 11.5" (151.1 cm)  BEHAVIORAL SYMPTOMS/MOOD NEUROLOGICAL BOWEL NUTRITION STATUS      Continent Diet(Regular /Thin Liquid)  AMBULATORY STATUS COMMUNICATION OF NEEDS Skin   Extensive Assist Verbally Surgical wounds(Breast Incision (Masectomy))                       Personal Care Assistance Level of Assistance  Bathing, Feeding, Dressing Bathing Assistance: Limited assistance Feeding assistance: Independent Dressing Assistance: Limited assistance     Functional Limitations Info  Sight, Hearing, Speech Sight Info: Adequate Hearing Info: Adequate Speech Info: Adequate    SPECIAL CARE FACTORS FREQUENCY  PT (By licensed PT), OT (By licensed OT)     PT Frequency: 3-5x week OT Frequency: 3-5x week            Contractures Contractures Info: Not present    Additional Factors Info  Code Status, Allergies Code Status Info: Full Code Allergies Info: Bee Venom           Current Medications (04/06/2017):  This is the current hospital active medication list Current Facility-Administered Medications  Medication Dose Route Frequency Provider Last Rate Last Dose  . 0.45 % NaCl with KCl 20 mEq / L infusion   Intravenous Continuous Alphonsa Overall, MD 75 mL/hr at 04/06/17 0600    . heparin injection 5,000 Units  5,000 Units Subcutaneous Q8H Alphonsa Overall, MD   5,000 Units at 04/06/17 9373  . HYDROcodone-acetaminophen (NORCO/VICODIN) 5-325 MG per tablet 1-2 tablet  1-2 tablet Oral Q4H  PRN Alphonsa Overall, MD   1 tablet at 04/05/17 2134  . morphine 4 MG/ML injection 1-3 mg  1-3 mg Intravenous Q2H PRN Alphonsa Overall, MD      . ondansetron (ZOFRAN-ODT) disintegrating tablet 4 mg  4 mg Oral Q6H PRN Alphonsa Overall, MD       Or  . ondansetron [facility]) injection 4 mg  4 mg Intravenous Q6H PRN Alphonsa Overall, MD      . ondansetron [facility]) tablet 4 mg  4 mg Oral Q6H PRN Alphonsa Overall, MD      . promethazine (PHENERGAN) suppository 25 mg  25 mg Rectal Q6H PRN Alphonsa Overall, MD       . sodium chloride flush (NS) 0.9 % injection 10-40 mL  10-40 mL Intracatheter PRN Alphonsa Overall, MD   10 mL at 04/05/17 4132  . traMADol (ULTRAM) tablet 50 mg  50 mg Oral Q6H PRN Alphonsa Overall, MD      . valACYclovir Estell Harpin) tablet 500 mg  500 mg Oral Daily Alphonsa Overall, MD   500 mg at 04/06/17 0815     Discharge Medications: Please see discharge summary for a list of discharge medications.  Relevant Imaging Results:  Relevant Lab Results:   Additional Information SSN [id number]   Barbette Or, [place]

## 2017-04-06 NOTE — Progress Notes (Signed)
Report called to Katie at [facility].

## 2017-04-06 NOTE — Progress Notes (Signed)
Spoke to Dr Marlou Starks about DC planning. He states that patient will need SNF due to need for rehab strength training, and lack of caregiver at home. CM changed PT order to immenent DC and spoke w Earnest Bailey PT, who will see her ASAP and call Rhea Pink CSW to update on rec. CM notified CSW of consult.

## 2017-04-06 NOTE — Clinical Social Work Note (Signed)
Clinical Social Work Assessment  Patient Details  Name: Jasmine Hood MRN: [id number] Date of Birth: March 12, 1946  Date of referral:  04/06/17               Reason for consult:  Discharge Planning                Permission sought to share information with:    Permission granted to share information::  Yes, Verbal Permission Granted  Name::        Agency::     Relationship::     Contact Information:     Housing/Transportation Living arrangements for the past 2 months:  Single Family Home(Patient was previously living with her daughter at home) Source of Information:  Patient Patient Interpreter Needed:  None Criminal Activity/Legal Involvement Pertinent to Current Situation/Hospitalization:  No - Comment as needed Significant Relationships:  Dependent Children Lives with:  Minor Children Do you feel safe going back to the place where you live?  Yes Need for family participation in patient care:  No (Coment)  Care giving concerns:  Patient has stage four breast cancer, recent mastectomy. Patient has adult dependent daughter whom she lived with prior to surgery.    Social Worker assessment / plan:  CSW met with patient to discuss plan for discharge and SNF placement preferences. Patient alert and oriented x4, sitting upright in chair eating. Patient stated she preferred a facility in close proximity to [facility] in [place]. CSW explained fax out process, expressed acceptance and understanding, verbal permission granted to initiate search.  Employment status:  Retired Forensic scientist:  Managed Care PT Recommendations:  [place] / Referral to community resources:  [place]  Patient/Family's Response to care:  No family present at bedside, patient aware of current state and understands medical plan of care.  Patient/Family's Understanding of and Emotional Response to Diagnosis, Current Treatment, and Prognosis:  See  above.  Emotional Assessment Appearance:  Appears stated age Attitude/Demeanor/Rapport:    Affect (typically observed):  Accepting, Pleasant Orientation:  Oriented to Self, Oriented to Place, Oriented to  Time, Oriented to Situation Alcohol / Substance use:  Not Applicable Psych involvement (Current and /or in the community):  No (Comment)  Discharge Needs  Concerns to be addressed:  Discharge Planning Concerns Readmission within the last 30 days:  No Current discharge risk:  Chronically ill- Stage 4 breast cancer Barriers to Discharge:   Patient unable to return home at discharge due to not having support.   [name], LCSW 04/06/2017, 2:56 PM

## 2017-04-06 NOTE — Progress Notes (Signed)
2 Days Post-Op   Subjective/Chief Complaint: No complaints. Family says she has trouble walking   Objective: Vital signs in last 24 hours: Temp:  [97.7 F (36.5 C)-98.4 F (36.9 C)] 97.7 F (36.5 C) (11/18 0446) Pulse Rate:  [77-87] 77 (11/18 0446) Resp:  [17-18] 18 (11/18 0446) BP: (100-136)/(49-62) 129/61 (11/18 0446) SpO2:  [95 %-100 %] 96 % (11/18 0446) Last BM Date: 04/04/17  Intake/Output from previous day: 11/17 0701 - 11/18 0700 In: 1802 [P.O.:952; I.V.:850] Out: 1903 [Urine:1750; Drains:153] Intake/Output this shift: Total I/O In: -  Out: 600 [Urine:600]  General appearance: alert and cooperative Resp: clear to auscultation bilaterally Chest wall: skin flaps viable Cardio: regular rate and rhythm GI: soft, non-tender; bowel sounds normal; no masses,  no organomegaly  Lab Results:  No results for input(s): WBC, HGB, HCT, PLT in the last 72 hours. BMET No results for input(s): NA, K, CL, CO2, GLUCOSE, BUN, CREATININE, CALCIUM in the last 72 hours. PT/INR No results for input(s): LABPROT, INR in the last 72 hours. ABG No results for input(s): PHART, HCO3 in the last 72 hours.  Invalid input(s): PCO2, PO2  Studies/Results: Nm Sentinel Node Inj-no Rpt (breast)  Result Date: 04/04/2017 Sulfur colloid was injected by the nuclear medicine technologist for melanoma sentinel node.   Mm Breast Surgical Specimen  Result Date: 04/04/2017 CLINICAL DATA:  Specimen radiograph was performed of the patient's right mastectomy and axillary node dissection EXAM: SPECIMEN RADIOGRAPH OF THE RIGHT BREAST COMPARISON:  Previous exam(s). FINDINGS: Status post right mastectomy. The 2 breast biopsy clips, including a coil clip and ribbon shaped clip, are seen within the specimen. Additionally, a spiral HydroMARK clip that was placed in the right axilla is present in the specimen. IMPRESSION: Specimen radiograph of the right breast. Electronically Signed   By: Curlene Dolphin M.D.   On:  04/04/2017 12:49    Anti-infectives: Anti-infectives (From admission, onward)   Start     Dose/Rate Route Frequency Ordered Stop   04/05/17 1000  valACYclovir (VALTREX) tablet 500 mg     500 mg Oral Daily 04/04/17 1550     04/04/17 0751  ceFAZolin (ANCEF) IVPB 2g/100 mL premix     2 g 200 mL/hr over 30 Minutes Intravenous On call to O.R. 04/04/17 0751 04/04/17 0959      Assessment/Plan: s/p Procedure(s) with comments: RIGHT MASTECTOMY WITH TARGETED RIGHT AXILLARY NODE DISSECTION AND LEFT PROPHYLATIC MASTECTOMY ERAS PATHWAY (Bilateral) - PEC BLOCK Advance diet  Teach pt drain care PT consult May need SNF if unable to go home  LOS: 0 days    TOTH III,[name] 04/06/2017

## 2017-04-07 NOTE — Telephone Encounter (Signed)
Ok to d/c from home health? Please advise

## 2017-04-07 NOTE — Telephone Encounter (Signed)
Okay to discharge patient from home health care

## 2017-04-08 NOTE — Telephone Encounter (Signed)
Left a detailed voice message for Jasmine Hood with [facility] with verbal orders to d/c home health per dr Raliegh Ip.

## 2017-04-24 NOTE — Progress Notes (Signed)
No show

## 2017-04-25 ENCOUNTER — Other Ambulatory Visit: Payer: Medicare Other

## 2017-04-25 ENCOUNTER — Encounter: Payer: Self-pay | Admitting: Oncology

## 2017-04-25 ENCOUNTER — Ambulatory Visit (HOSPITAL_BASED_OUTPATIENT_CLINIC_OR_DEPARTMENT_OTHER): Payer: Medicare Other | Admitting: Oncology

## 2017-04-25 DIAGNOSIS — Z170 Estrogen receptor positive status [ER+]: Secondary | ICD-10-CM

## 2017-04-25 DIAGNOSIS — C50811 Malignant neoplasm of overlapping sites of right female breast: Secondary | ICD-10-CM

## 2017-04-29 ENCOUNTER — Telehealth: Payer: Self-pay | Admitting: *Deleted

## 2017-04-29 NOTE — Telephone Encounter (Signed)
Copied from [place] [phone]. Topic: General - Other >> Apr 28, 2017  3:34 PM Synthia Innocent wrote: Reason for CRM: Requesting verbal orders, incision and JP drain 2x a week for 2 weeks, 1x a week for 4 weeks. Rt Buttocks stage 2 pressure wound, monitor

## 2017-04-30 NOTE — Telephone Encounter (Signed)
Okay for requested orders for wound care

## 2017-04-30 NOTE — Telephone Encounter (Signed)
Copied from [place] [id number]. Topic: General - Other >> Apr 28, 2017  3:34 PM Synthia Innocent wrote: Reason for CRM: Requesting verbal orders, incision and JP drain 2x a week for 2 weeks, 1x a week for 4 weeks. Rt Buttocks stage 2 pressure wound, monitor >> Apr 30, 2017 11:27 AM Oneta Rack wrote: Osvaldo Human name: Erasmo Downer  Relation to pt:  PT from [facility]  Call back number: [phone]    Reason for call:  Requesting PT 2x 8

## 2017-04-30 NOTE — Telephone Encounter (Signed)
Ok for PT as well per Dr. Raliegh Ip. All verbal orders called to [place].  No further needs at time of call.

## 2017-05-01 ENCOUNTER — Ambulatory Visit (INDEPENDENT_AMBULATORY_CARE_PROVIDER_SITE_OTHER): Payer: Medicare Other | Admitting: Internal Medicine

## 2017-05-01 ENCOUNTER — Encounter: Payer: Self-pay | Admitting: Internal Medicine

## 2017-05-01 VITALS — BP 118/78 | HR 104 | Temp 98.1°F | Ht 59.0 in | Wt 225.2 lb

## 2017-05-01 DIAGNOSIS — Z170 Estrogen receptor positive status [ER+]: Secondary | ICD-10-CM

## 2017-05-01 DIAGNOSIS — C50811 Malignant neoplasm of overlapping sites of right female breast: Secondary | ICD-10-CM | POA: Diagnosis not present

## 2017-05-01 DIAGNOSIS — I10 Essential (primary) hypertension: Secondary | ICD-10-CM

## 2017-05-01 DIAGNOSIS — R531 Weakness: Secondary | ICD-10-CM | POA: Diagnosis not present

## 2017-05-01 MED ORDER — VALACYCLOVIR HCL 500 MG PO TABS: 500.0000 mg | ORAL_TABLET | Freq: Every day | ORAL | 6 refills | Status: DC

## 2017-05-01 MED ORDER — HYDROCODONE-ACETAMINOPHEN 5-325 MG PO TABS: 1.0000 | ORAL_TABLET | Freq: Four times a day (QID) | ORAL | 0 refills | Status: DC | PRN

## 2017-05-01 NOTE — Progress Notes (Signed)
Subjective:    Patient ID: Jasmine Hood, female    DOB: 08-30-1945, 71 y.o.   MRN: [id number]  HPI  71 year old patient who is followed by oncology with metastatic breast cancer.  She was seen in follow-up by general surgery yesterday following bilateral mastectomies.  She is scheduled to see radiation oncology for consideration of therapy for thoracic metastatic disease she generally feels well.  Accompanied by a daughter today.  Medical regimen includes daily Valtrex as well as as needed hydrocodone only  Past Medical History:  Diagnosis Date  . Breast cancer ([place]) 10/07/2016   right breast, spine  . Brown recluse spider bite    right leg  . COLONIC POLYPS, HX OF 01/05/2009   Qualifier: Diagnosis of  By: [name]  MD, [name]   . Complication of anesthesia    slow to awaken after wisdom  teeth extraction age 1  . Constipation   . Genetic testing 11/27/2016   Ms. Wingard underwent genetic counseling and testing for hereditary cancer syndromes on 11/19/2016. Her results were negative for mutations in all 46 genes analyzed by [facility]'s 46-gene Common Hereditary Cancers Panel. Genes analyzed include: APC, ATM, AXIN2, BARD1, BMPR1A, BRCA1, BRCA2, BRIP1, CDH1, CDKN2A, CHEK2, CTNNA1, DICER1, EPCAM, GREM1, HOXB13, KIT, MEN1, MLH1, MSH2, MSH3, MSH6, MUTYH, NBN,  . HYPOTHYROIDISM 02/01/2010   Qualifier: Diagnosis of  By: [name]  MD, [name]   . Morbid obesity ([place]) 01/05/2009   Qualifier: Diagnosis of  By: [name]  MD, [name]   . Pneumonia 1987  . Sleep apnea    no cpap used   . Stroke [facility])    found on CT scan     Social History   Socioeconomic History  . Marital status: Widowed    Spouse name: Not on file  . Number of children: 5  . Years of education: Not on file  . Highest education level: Not on file  Social Needs  . Financial resource strain: Not on file  . Food insecurity - worry: Not on file  . Food insecurity - inability: Not on file  . Transportation needs  - medical: Not on file  . Transportation needs - non-medical: Not on file  Occupational History  . Occupation: retired  Tobacco Use  . Smoking status: Never Smoker  . Smokeless tobacco: Never Used  Substance and Sexual Activity  . Alcohol use: No  . Drug use: No  . Sexual activity: Not Currently  Other Topics Concern  . Not on file  Social History Narrative  . Not on file    Past Surgical History:  Procedure Laterality Date  . BILATERAL TOTAL MASTECTOMY WITH AXILLARY LYMPH NODE DISSECTION Bilateral 04/04/2017   Procedure: RIGHT MASTECTOMY WITH TARGETED RIGHT AXILLARY NODE DISSECTION AND LEFT PROPHYLATIC MASTECTOMY ERAS PATHWAY;  Surgeon: Alphonsa Overall, MD;  Location: [place];  Service: General;  Laterality: Bilateral;  PEC BLOCK  . BREAST BIOPSY Right 10/07/2016   invasive mammary carcinoma  . BREAST BIOPSY Left 10/08/2016   breast fibroadenoma no malignancy  . CATARACT EXTRACTION Bilateral   . COLONOSCOPY W/ BIOPSIES AND POLYPECTOMY  2012  . COLONOSCOPY WITH PROPOFOL N/A 12/22/2015   Procedure: COLONOSCOPY WITH PROPOFOL;  Surgeon: [name], MD;  Location: [facility];  Service: Endoscopy;  Laterality: N/A;  . INCISION AND DRAINAGE Right 1998   surgery for spider bite on RLE; "[name]"  . MASTECTOMY Left 04/04/2017   [name]  . MASTECTOMY WITH AXILLARY LYMPH NODE DISSECTION  Right 04/04/2017  . MOLE REMOVAL  10/29/2016   Procedure: MOLE REMOVAL LEFT CHEST;  Surgeon: Alphonsa Overall, MD;  Location: [facility];  Service: General;;  . PORTACATH PLACEMENT N/A 10/29/2016   Procedure: INSERTION PORT-A-CATH;  Surgeon: Alphonsa Overall, MD;  Location: [facility];  Service: General;  Laterality: N/A;  . TUBAL LIGATION    . WISDOM TOOTH EXTRACTION  age 18    Family History  Problem Relation Age of Onset  . Breast cancer Mother 21       d.52 from metastatic disease  . Throat cancer Father        d.65 history of smoking  . Rheum arthritis Sister        3 sisters pos. for osteo  and RA  . Diabetes Maternal Aunt   . Cancer Maternal Grandfather        mouth cancer-chewed tobacco  . Breast cancer Cousin 13  . Breast cancer Daughter 51  . Stomach cancer Sister 20    Allergies  Allergen Reactions  . Bee Venom Anaphylaxis    No current outpatient medications on file prior to visit.   No current facility-administered medications on file prior to visit.     BP 118/78   Pulse (!) 104   Temp 98.1 F (36.7 C) (Oral)   Ht _0  (1.499 m)   Wt 225 lb 3.2 oz (102.2 kg)   SpO2 91%   BMI 45.48 kg/m     Review of Systems  Constitutional: Positive for activity change, appetite change and fatigue.  HENT: Negative for congestion, dental problem, hearing loss, rhinorrhea, sinus pressure, sore throat and tinnitus.   Eyes: Negative for pain, discharge and visual disturbance.  Respiratory: Negative for cough and shortness of breath.   Cardiovascular: Negative for chest pain, palpitations and leg swelling.  Gastrointestinal: Negative for abdominal distention, abdominal pain, blood in stool, constipation, diarrhea, nausea and vomiting.  Genitourinary: Negative for difficulty urinating, dysuria, flank pain, frequency, hematuria, pelvic pain, urgency, vaginal bleeding, vaginal discharge and vaginal pain.  Musculoskeletal: Positive for arthralgias and back pain. Negative for gait problem and joint swelling.  Skin: Negative for rash.  Neurological: Positive for weakness. Negative for dizziness, syncope, speech difficulty, numbness and headaches.  Hematological: Negative for adenopathy.  Psychiatric/Behavioral: Negative for agitation, behavioral problems and dysphoric mood. The patient is not nervous/anxious.        Objective:   Physical Exam  Constitutional: She is oriented to person, place, and time. She appears well-developed and well-nourished.   Weight 225 Blood pressure normal  HENT:  Head: Normocephalic.  Right Ear: External ear normal.  Left Ear: External  ear normal.  Mouth/Throat: Oropharynx is clear and moist.  Eyes: Conjunctivae and EOM are normal. Pupils are equal, round, and reactive to light.  Neck: Normal range of motion. Neck supple. No thyromegaly present.  Cardiovascular: Normal rate, regular rhythm, normal heart sounds and intact distal pulses.  Resting pulse rate 100  Pulmonary/Chest: Effort normal and breath sounds normal.  Abdominal: Soft. Bowel sounds are normal. She exhibits no mass. There is no tenderness.  Musculoskeletal: Normal range of motion. She exhibits edema.  Lymphadenopathy:    She has no cervical adenopathy.  Neurological: She is alert and oriented to person, place, and time.  Skin: Skin is warm and dry. No rash noted.  Psychiatric: She has a normal mood and affect. Her behavior is normal.          Assessment & Plan:  Metastatic breast cancer.  Follow-up  general surgery medical and radiation oncology Medications updated  Follow-up here 6 months or as needed  Nyoka Cowden

## 2017-05-01 NOTE — Patient Instructions (Signed)
Limit your sodium (Salt) intake  Oncology, radiation oncology and general surgery follow-up  Return in 6 months for follow-up

## 2017-05-06 NOTE — Progress Notes (Signed)
Location of Breast Cancer:Right Breast  7 o'clock position   FUN after chemotherapy and surgery.   Histology per Pathology Report Diagnosis 11-18-16 Breast, left, needle core biopsy, upper outer quadrant - ATYPICAL DUCTAL HYPERPLASIA - SEE COMMENT    Diagnosis 10/07/2016: 1. Breast, right, needle core biopsy, 7:00 o'clock - INVASIVE MAMMARY CARCINOMA.- SEE COMMENT. 2. Breast, right, needle core biopsy, 10:00 o'clock - INVASIVE MAMMARY CARCINOMA.- SEE COMMENT. 3. Lymph node, needle/core biopsy, right axilla- CARCINOMA.- SEE COMMENT.  Receptor Status: ER(70%+), PR (15%+), Her2-neuneg (ration 1.72), Ki-67(20%)  Did patient present with symptoms (if so, please note symptoms) or was this found on screening mammography?: patient noticed changes in September 2017 right nipple more inverted than previous years, mammogram done 10/03/16  Past/Anticipated interventions by surgeon, if any:Dr. Alphonsa Overall, MD Dr. Alphonsa Overall 04-04-17 Breast, simple mastectomy, Left, Breast, modified radical mastectomy , Breast, modified radical mastectomy , Right Breast and Axillary nodes, Skin , Superior skin flap right breast,Skin , Left breast superior flap    1. PROGNOSTIC INDICATORS Receptor Status: ER(95%+), PR (95%+), Her2-neu (-), Ki-67()  2. PROGNOSTIC INDICATORS Receptor Status: ER(95%+), PR (95%+), Her2-neu (), Ki-67()   Diagnosis 04-04-17 1. Breast, simple mastectomy, Left - DUCTAL CARCINOMA IN SITU WITH CALCIFICATIONS, LOW GRADE, SPANNING 1.2 CM - THE SURGICAL RESECTION MARGINS ARE NEGATIVE FOR CARCINOMA. - SEE ONCOLOGY TABLE BELOW. 2. Breast, modified radical mastectomy , Right Breast and Axillary nodes - INVASIVE MAMMARY CARCINOMA, PREDOMINANTLY LOBULAR PHENOTYPE, GRADE II/III, SPANNING 8.0 CM - LOBULAR CARCINOMA IN SITU. - INVASIVE CARCINOMA IS FOCALLY PRESENT AT THE POSTERIOR MARGIN - PERINEURAL INVASION IS IDENTIFIED. - METASTATIC CARCINOMA IN 10 OF 10 LYMPH NODES (10/10) WITH  EXTRACAPSULAR EXTENSION. - AXILLARY SOFT TISSUE TUMOR DEPOSIT - SEE ONCOLOGY TABLE BELOW. 3. Skin , Superior skin flap right breast - BENIGN SKIN. 4. Skin , Left breast superior flap - BENIGN SKIN.   Receptor Status: ER(70 % -  95%+), PR (15 - 85%+), Her2-neu No amplification was detected, Ki-67(15 % - 20 %)   Past/Anticipated interventions by medical oncology, if any: Chemotherapy :neoadjuvant treatment to consist of cyclophosphamide, methotrexate, and fluorouracil (CMF) chemotherapy every 21 days 8, starting 11/05/2016   10-18 PET,genetics testing 11/19/2016, Dr. [name] 10/21/16 new consult,definitive surgery to follow,adjuvant radiation to follow surgery,anti-estrogen therapy to follow at the completion of local treatment                                              Lymphedema issues, if any: Yes, PT twice a week for six weeks two more weeks to go  ROM is limited to both arms due to discomfort to breast. Skin to both breast healing with scalping along the incision line, looks clean and healthy.  Saw Dr. Alphonsa Overall last 05-01-17 will see again in March 2019  Pain issues, if any: 7/10 low back taking hydrocodone   SAFETY ISSUES: Yes history of falling  Prior radiation? No   Pacemaker/ICD? No  Is the patient on methotrexate? No   Menarche 10 G6 P5, BC yes   Menopause 48 ?   HRT No  Current Complaints / other details:  Mother breast cancer died age 68, maternal cousin breast cancer, daughter breast cancer, Father  Throat cancer,Maternal  Grandfather mouth cancer,chewed tobacco

## 2017-05-09 ENCOUNTER — Ambulatory Visit: Payer: Self-pay | Admitting: *Deleted

## 2017-05-09 ENCOUNTER — Ambulatory Visit: Payer: Self-pay

## 2017-05-09 NOTE — Telephone Encounter (Signed)
Patient is having burning and itching when she uses the bathroom. She was treated with 2 antibiotics recently for UTI. Patient's daughter is calling with concerns about the red areas at her cuticles at her toenails as well.  Reason for Disposition . [1] Symptoms of a "yeast infection" (i.e., itchy, white discharge, not bad smelling) AND [2] not improved > 3 days following CARE ADVICE  Answer Assessment - Initial Assessment Questions 1. DESCRIPTION: "Describe the itching you are having." "Where is it located?"     Vulvar area- itching that is intense 2. SEVERITY: "How bad is it?"    - MILD - doesn't interfere with normal activities   - MODERATE-SEVERE: interferes with work, school, sleep, or other activities      Moderate - severe 3. SCRATCHING: "Are there any scratch marks? Bleeding?"     scratching 4. ONSET: "When did the itching begin?"      3 days- bad 5. CAUSE: "What do you think is causing the itching?"      Possible yeast 6. OTHER SYMPTOMS: "Do you have any other symptoms?"      Redness at toenails 7. PREGNANCY: "Is there any chance you are pregnant?" "When was your last menstrual period?"     n/a  Protocols used: VULVAR SYMPTOMS-A-AH, ITCHING - LOCALIZED-A-AH

## 2017-05-09 NOTE — Telephone Encounter (Signed)
Patient's daughter called in with patient c/o "burning and pain when urinating." I asked to speak with the patient, who verified the above complaint. The call was continued with the patient. She said she just got over a UTI last month and was on antibiotics. Her daughter said the home health nurse came for a visit and told them to have her seen by her PCP asap. The patient denies fever, which is verified by asking her daughter. The patient says her pain is a 7 on a pain scale 1-10. These symptoms started 2 days ago says the patient and she said it's lasted those days. Her daughter reported some confusion of the patient with dates and simple things asked, otherwise the patient is alert and oriented. The protocol indicates to see the PCP within 24 hours, no available appointments at current practice location, appointment made for tomorrow with [facility], care advice given to patient and daughter was listening, both verbalized understanding.    Reason for Disposition . > 2 UTI's in last year  Answer Assessment - Initial Assessment Questions 1. SEVERITY: "How bad is the pain?"  (e.g., Scale 1-10; mild, moderate, or severe)   - MILD (1-3): complains slightly about urination hurting   - MODERATE (4-7): interferes with normal activities     - SEVERE (8-10): excruciating, unwilling or unable to urinate because of the pain      7 2. FREQUENCY: "How many times have you had painful urination today?"      3 3. PATTERN: "Is pain present every time you urinate or just sometimes?"      Everytime 4. ONSET: "When did the painful urination start?"      2 days ago 5. FEVER: "Do you have a fever?" If so, ask: "What is your temperature, how was it measured, and when did it start?"     Denies 6. PAST UTI: "Have you had a urine infection before?" If so, ask: "When was the last time?" and "What happened that time?"      Yes, twice, was on antibiotic last month 7. CAUSE: "What do you think is  causing the painful urination?"  (e.g., UTI, scratch, Herpes sore)     Urinary Tract Infections 8. OTHER SYMPTOMS: "Do you have any other symptoms?" (e.g., flank pain, vaginal discharge, genital sores, urgency, blood in urine)     Urgency at times 9. PREGNANCY: "Is there any chance you are pregnant?" "When was your last menstrual period?"     N/A  Protocols used: [place]

## 2017-05-09 NOTE — Telephone Encounter (Signed)
Dion Saucier, RN with [facility] called in for the pt.   She is c/o burning with urination, strong odor to her urine and it's dark.  She completed a round of antibiotics about 3 wks ago.  I was going to go ahead and make her an appt however Lattie Haw said the daughter could do that so that could schedule a time that worked for them versus doing it through the home health nurse.  Lattie Haw assured me the daughter would call and make the appt for her mother.   Reason for Disposition . Age > 50 years  Answer Assessment - Initial Assessment Questions 1. SYMPTOM: "What's the main symptom you're concerned about?" (e.g., frequency, incontinence)     Burning with urination, strong odor and it's dark 2. ONSET: "When did the  ________  start?"     Within the last 2 days 3. PAIN: "Is there any pain?" If so, ask: "How bad is it?" (Scale: 1-10; mild, moderate, severe)     Just the burning 4. CAUSE: "What do you think is causing the symptoms?"     UTI 5. OTHER SYMPTOMS: "Do you have any other symptoms?" (e.g., fever, flank pain, blood in urine, pain with urination)     Dark urine, strong odor.  97.5 temp 6. PREGNANCY: "Is there any chance you are pregnant?" "When was your last menstrual period?"     Not asked due to age  Answer Assessment - Initial Assessment Questions 1. SEVERITY: "How bad is the pain?"  (e.g., Scale 1-10; mild, moderate, or severe)   - MILD (1-3): complains slightly about urination hurting   - MODERATE (4-7): interferes with normal activities     - SEVERE (8-10): excruciating, unwilling or unable to urinate because of the pain      burning 2. FREQUENCY: "How many times have you had painful urination today?"      Incontinent 3. PATTERN: "Is pain present every time you urinate or just sometimes?"      Burning every time urinates 4. ONSET: "When did the painful urination start?"       5. FEVER: "Do you have a fever?" If so, ask: "What is your temperature, how was it measured,  and when did it start?"     *No Answer* 6. PAST UTI: "Have you had a urine infection before?" If so, ask: "When was the last time?" and "What happened that time?"      *No Answer* 7. CAUSE: "What do you think is causing the painful urination?"  (e.g., UTI, scratch, Herpes sore)     *No Answer* 8. OTHER SYMPTOMS: "Do you have any other symptoms?" (e.g., flank pain, vaginal discharge, genital sores, urgency, blood in urine)     *No Answer* 9. PREGNANCY: "Is there any chance you are pregnant?" "When was your last menstrual period?"     *No Answer*  Protocols used: [place], URINARY SYMPTOMS-A-AH

## 2017-05-10 ENCOUNTER — Other Ambulatory Visit: Payer: Self-pay

## 2017-05-10 ENCOUNTER — Ambulatory Visit: Payer: Self-pay | Admitting: Family Medicine

## 2017-05-10 ENCOUNTER — Emergency Department (HOSPITAL_COMMUNITY)
Admission: EM | Admit: 2017-05-10 | Discharge: 2017-05-10 | Disposition: A | Discharge status: Home / Self Care | Payer: Medicare Other | Attending: Emergency Medicine | Admitting: Emergency Medicine

## 2017-05-10 ENCOUNTER — Encounter (HOSPITAL_COMMUNITY): Payer: Self-pay

## 2017-05-10 DIAGNOSIS — E039 Hypothyroidism, unspecified: Secondary | ICD-10-CM | POA: Insufficient documentation

## 2017-05-10 DIAGNOSIS — R300 Dysuria: Secondary | ICD-10-CM | POA: Diagnosis present

## 2017-05-10 DIAGNOSIS — N3001 Acute cystitis with hematuria: Secondary | ICD-10-CM | POA: Diagnosis not present

## 2017-05-10 DIAGNOSIS — L03032 Cellulitis of left toe: Secondary | ICD-10-CM | POA: Diagnosis not present

## 2017-05-10 DIAGNOSIS — Z8673 Personal history of transient ischemic attack (TIA), and cerebral infarction without residual deficits: Secondary | ICD-10-CM | POA: Insufficient documentation

## 2017-05-10 DIAGNOSIS — I10 Essential (primary) hypertension: Secondary | ICD-10-CM | POA: Insufficient documentation

## 2017-05-10 DIAGNOSIS — Z79899 Other long term (current) drug therapy: Secondary | ICD-10-CM | POA: Diagnosis not present

## 2017-05-10 LAB — CBC
HCT: 38.4 % (ref 36.0–46.0)
Hemoglobin: 12.4 g/dL (ref 12.0–15.0)
MCH: 34.5 pg — ABNORMAL HIGH (ref 26.0–34.0)
MCHC: 32.3 g/dL (ref 30.0–36.0)
MCV: 107 fL — ABNORMAL HIGH (ref 78.0–100.0)
Platelets: 216 10*3/uL (ref 150–400)
RBC: 3.59 MIL/uL — ABNORMAL LOW (ref 3.87–5.11)
RDW: 13.5 % (ref 11.5–15.5)
WBC: 7.1 10*3/uL (ref 4.0–10.5)

## 2017-05-10 LAB — URINALYSIS, ROUTINE W REFLEX MICROSCOPIC
Bilirubin Urine: NEGATIVE
Glucose, UA: NEGATIVE mg/dL
Hgb urine dipstick: NEGATIVE
Ketones, ur: 5 mg/dL — AB
Nitrite: NEGATIVE
Protein, ur: 100 mg/dL — AB
Specific Gravity, Urine: 1.011 (ref 1.005–1.030)
pH: 5 (ref 5.0–8.0)

## 2017-05-10 LAB — BASIC METABOLIC PANEL
Anion gap: 10 (ref 5–15)
BUN: 5 mg/dL — ABNORMAL LOW (ref 6–20)
CO2: 23 mmol/L (ref 22–32)
Calcium: 8.5 mg/dL — ABNORMAL LOW (ref 8.9–10.3)
Chloride: 106 mmol/L (ref 101–111)
Creatinine, Ser: 0.67 mg/dL (ref 0.44–1.00)
GFR calc Af Amer: >60 mL/min (ref >60)
GFR calc non Af Amer: >60 mL/min (ref >60)
Glucose, Bld: 128 mg/dL — ABNORMAL HIGH (ref 65–99)
Potassium: 3.8 mmol/L (ref 3.5–5.1)
Sodium: 139 mmol/L (ref 135–145)

## 2017-05-10 MED ORDER — BUPIVACAINE HCL (PF) 0.5 % IJ SOLN
10.0000 mL | Freq: Once | INTRAMUSCULAR | Status: AC
  Administered 2017-05-10: 10 mL
  Filled 2017-05-10: qty 10

## 2017-05-10 MED ORDER — PHENAZOPYRIDINE HCL 100 MG PO TABS
200.0000 mg | ORAL_TABLET | Freq: Once | ORAL | Status: AC
  Administered 2017-05-10: 200 mg via ORAL
  Filled 2017-05-10: qty 2

## 2017-05-10 MED ORDER — SULFAMETHOXAZOLE-TRIMETHOPRIM 800-160 MG PO TABS
1.0000 | ORAL_TABLET | Freq: Once | ORAL | Status: AC
  Administered 2017-05-10: 1 via ORAL
  Filled 2017-05-10: qty 1

## 2017-05-10 MED ORDER — SULFAMETHOXAZOLE-TRIMETHOPRIM 800-160 MG PO TABS: 1.0000 | ORAL_TABLET | Freq: Two times a day (BID) | ORAL | 0 refills | Status: AC

## 2017-05-10 MED ORDER — BACITRACIN ZINC 500 UNIT/GM EX OINT
TOPICAL_OINTMENT | Freq: Once | CUTANEOUS | Status: AC
  Administered 2017-05-10: 1 via TOPICAL

## 2017-05-10 MED ORDER — PHENAZOPYRIDINE HCL 200 MG PO TABS: 200.0000 mg | ORAL_TABLET | Freq: Three times a day (TID) | ORAL | 0 refills | Status: DC

## 2017-05-10 NOTE — ED Triage Notes (Signed)
Pt states she has been having vaginal burning with urination X3 days. Pt brought in with family, states she began complaining of this burning yesterday.

## 2017-05-10 NOTE — ED Notes (Signed)
ED Provider at bedside. 

## 2017-05-10 NOTE — ED Notes (Addendum)
Pt's daughter states that in addition to dysuria, there is a "bad smell coming from her vaginal area" and that it's been "burning her badly".

## 2017-05-10 NOTE — ED Provider Notes (Signed)
[facility] EMERGENCY DEPARTMENT Provider Note   CSN: [id number] Arrival date & time: 05/10/17  1016     History   Chief Complaint Chief Complaint  Patient presents with  . Dysuria    HPI Jasmine Hood is a 71 y.o. female.  Pt presents to the ED today with dysuria and a red spot on her left great toe.  Pt said dysuria has been going on for 3 days.  The pt denies any f/c.  She does have a hx of metastatic breast cancer, and is followed by Dr. Sarajane Jews.       Past Medical History:  Diagnosis Date  . Breast cancer ([place]) 10/07/2016   right breast, spine  . Brown recluse spider bite    right leg  . COLONIC POLYPS, HX OF 01/05/2009   Qualifier: Diagnosis of  By: [name]  MD, [name]   . Complication of anesthesia    slow to awaken after wisdom  teeth extraction age 65  . Constipation   . Genetic testing 11/27/2016   Ms. Stetler underwent genetic counseling and testing for hereditary cancer syndromes on 11/19/2016. Her results were negative for mutations in all 46 genes analyzed by [facility]'s 46-gene Common Hereditary Cancers Panel. Genes analyzed include: APC, ATM, AXIN2, BARD1, BMPR1A, BRCA1, BRCA2, BRIP1, CDH1, CDKN2A, CHEK2, CTNNA1, DICER1, EPCAM, GREM1, HOXB13, KIT, MEN1, MLH1, MSH2, MSH3, MSH6, MUTYH, NBN,  . HYPOTHYROIDISM 02/01/2010   Qualifier: Diagnosis of  By: [name]  MD, [name]   . Morbid obesity ([place]) 01/05/2009   Qualifier: Diagnosis of  By: [name]  MD, [name]   . Pneumonia 1987  . Sleep apnea    no cpap used   . Stroke [facility])    found on CT scan    Patient Active Problem List   Diagnosis Date Noted  . Breast cancer, stage 4, right ([place]) 04/04/2017  . Acute lower UTI 02/20/2017  . Elevated lactic acid level   . Urinary tract infection without hematuria   . Weakness   . Generalized weakness 02/19/2017  . AKI (acute kidney injury) ([place]) 02/12/2017  . Dehydration 02/12/2017  . Abnormal TSH 02/12/2017  . Genetic testing  11/27/2016  . Port catheter in place 11/14/2016  . Bone metastases ([place]) 11/11/2016  . Morbid obesity with BMI of 50.0-59.9, adult ([place]) 11/05/2016  . Cirrhosis of liver ([place]) 11/05/2016  . Malignant neoplasm of overlapping sites of right breast in female, estrogen receptor positive ([place]) 10/21/2016  . Encounter for follow-up surveillance of colon cancer   . Benign neoplasm of ascending colon   . Benign neoplasm of descending colon   . Benign neoplasm of transverse colon   . Benign neoplasm of cecum   . OSA (obstructive sleep apnea) 09/14/2012  . Contact dermatitis and eczema due to plant 12/31/2011  . Hypothyroidism 02/01/2010  . Essential hypertension 01/05/2009  . History of colonic polyps 01/05/2009    Past Surgical History:  Procedure Laterality Date  . BILATERAL TOTAL MASTECTOMY WITH AXILLARY LYMPH NODE DISSECTION Bilateral 04/04/2017   Procedure: RIGHT MASTECTOMY WITH TARGETED RIGHT AXILLARY NODE DISSECTION AND LEFT PROPHYLATIC MASTECTOMY ERAS PATHWAY;  Surgeon: Alphonsa Overall, MD;  Location: [facility];  Service: General;  Laterality: Bilateral;  PEC BLOCK  . BREAST BIOPSY Right 10/07/2016   invasive mammary carcinoma  . BREAST BIOPSY Left 10/08/2016   breast fibroadenoma no malignancy  . CATARACT EXTRACTION Bilateral   . COLONOSCOPY W/ BIOPSIES AND POLYPECTOMY  2012  . COLONOSCOPY WITH PROPOFOL N/A  12/22/2015   Procedure: COLONOSCOPY WITH PROPOFOL;  Surgeon: [name], MD;  Location: [facility];  Service: Endoscopy;  Laterality: N/A;  . INCISION AND DRAINAGE Right 1998   surgery for spider bite on RLE; "[name]"  . MASTECTOMY Left 04/04/2017   [name]  . MASTECTOMY WITH AXILLARY LYMPH NODE DISSECTION Right 04/04/2017  . MOLE REMOVAL  10/29/2016   Procedure: MOLE REMOVAL LEFT CHEST;  Surgeon: Alphonsa Overall, MD;  Location: [facility];  Service: General;;  . PORTACATH PLACEMENT N/A 10/29/2016   Procedure: INSERTION PORT-A-CATH;  Surgeon: Alphonsa Overall, MD;   Location: [facility];  Service: General;  Laterality: N/A;  . TUBAL LIGATION    . WISDOM TOOTH EXTRACTION  age 45    OB History    No data available       Home Medications    Prior to Admission medications   Medication Sig Start Date End Date Taking? Authorizing Provider  HYDROcodone-acetaminophen (NORCO/VICODIN) 5-325 MG tablet Take 1-2 tablets by mouth every 6 (six) hours as needed for moderate pain. 05/01/17   Marletta Lor, MD  phenazopyridine (PYRIDIUM) 200 MG tablet Take 1 tablet (200 mg total) by mouth 3 (three) times daily. 05/10/17   Isla Pence, MD  sulfamethoxazole-trimethoprim (BACTRIM DS,SEPTRA DS) 800-160 MG tablet Take 1 tablet by mouth 2 (two) times daily for 7 days. 05/10/17 05/17/17  Isla Pence, MD  valACYclovir (VALTREX) 500 MG tablet Take 1 tablet (500 mg total) by mouth daily. 05/01/17   Marletta Lor, MD    Family History Family History  Problem Relation Age of Onset  . Breast cancer Mother 32       d.52 from metastatic disease  . Throat cancer Father        d.65 history of smoking  . Rheum arthritis Sister        3 sisters pos. for osteo and RA  . Diabetes Maternal Aunt   . Cancer Maternal Grandfather        mouth cancer-chewed tobacco  . Breast cancer Cousin 38  . Breast cancer Daughter 67  . Stomach cancer Sister 41    Social History Social History   Tobacco Use  . Smoking status: Never Smoker  . Smokeless tobacco: Never Used  Substance Use Topics  . Alcohol use: No  . Drug use: No     Allergies   Bee venom   Review of Systems Review of Systems  Genitourinary: Positive for dysuria.  Skin: Positive for wound.  All other systems reviewed and are negative.    Physical Exam Updated Vital Signs BP 139/72 (BP Location: Left Arm)   Pulse 84   Temp 98.4 F (36.9 C) (Oral)   Resp 16   SpO2 95%   Physical Exam  Constitutional: She is oriented to person, place, and time. She appears well-developed and  well-nourished.  HENT:  Head: Normocephalic and atraumatic.  Right Ear: External ear normal.  Left Ear: External ear normal.  Nose: Nose normal.  Mouth/Throat: Oropharynx is clear and moist.  Eyes: Conjunctivae and EOM are normal. Pupils are equal, round, and reactive to light.  Neck: Normal range of motion. Neck supple.  Cardiovascular: Normal rate, regular rhythm, normal heart sounds and intact distal pulses.  Pulmonary/Chest: Effort normal and breath sounds normal.  Abdominal: Soft. Bowel sounds are normal. There is tenderness in the suprapubic area.  Musculoskeletal: Normal range of motion.  Neurological: She is alert and oriented to person, place, and time.  Skin:  Skin is warm. Capillary refill takes less than 2 seconds.  Paronychia to left great toe  Psychiatric: She has a normal mood and affect. Her behavior is normal. Judgment and thought content normal.  Nursing note and vitals reviewed.    ED Treatments / Results  Labs (all labs ordered are listed, but only abnormal results are displayed) Labs Reviewed  URINALYSIS, ROUTINE W REFLEX MICROSCOPIC - Abnormal; Notable for the following components:      Result Value   APPearance TURBID (*)    Ketones, ur 5 (*)    Protein, ur 100 (*)    Leukocytes, UA MODERATE (*)    Bacteria, UA MANY (*)    Squamous Epithelial / LPF 6-30 (*)    Non Squamous Epithelial 0-5 (*)    All other components within normal limits  BASIC METABOLIC PANEL - Abnormal; Notable for the following components:   Glucose, Bld 128 (*)    BUN 5 (*)    Calcium 8.5 (*)    All other components within normal limits  CBC - Abnormal; Notable for the following components:   RBC 3.59 (*)    MCV 107.0 (*)    MCH 34.5 (*)    All other components within normal limits    EKG  EKG Interpretation None       Radiology No results found.  Procedures .Nerve Block Date/Time: 05/10/2017 1:57 PM Performed by: Isla Pence, MD Authorized by: Isla Pence,  MD   Consent:    Consent obtained:  Verbal   Consent given by:  Patient   Risks discussed:  Bleeding, pain and unsuccessful block   Alternatives discussed:  No treatment Indications:    Indications:  Procedural anesthesia Location:    Body area:  Lower extremity   Laterality:  Left Pre-procedure details:    Skin preparation:  Povidone-iodine Skin anesthesia (see MAR for exact dosages):    Skin anesthesia method:  Local infiltration   Local anesthetic:  Bupivacaine 0.5% w/o epi Procedure details (see MAR for exact dosages):    Block needle gauge:  27 G   Anesthetic injected:  Bupivacaine 0.5% w/o epi   Steroid injected:  None   Additive injected:  None   Injection procedure:  Anatomic landmarks identified, incremental injection, negative aspiration for blood, anatomic landmarks palpated and introduced needle Post-procedure details:    Dressing:  None   Outcome:  Anesthesia achieved   Patient tolerance of procedure:  Tolerated well, no immediate complications Comments:     Digital block left great toe .Marland KitchenIncision and Drainage Date/Time: 05/10/2017 1:59 PM Performed by: Isla Pence, MD Authorized by: Isla Pence, MD   Consent:    Consent obtained:  Verbal   Consent given by:  Patient   Risks discussed:  Bleeding, pain and incomplete drainage   Alternatives discussed:  No treatment Location:    Type:  Abscess   Size:  .5   Location:  Lower extremity   Lower extremity location:  Toe   Toe location:  L big toe Pre-procedure details:    Skin preparation:  Betadine Anesthesia (see MAR for exact dosages):    Anesthesia method:  Nerve block   Block needle gauge:  27 G   Block anesthetic:  Bupivacaine 0.5% w/o epi   Block injection procedure:  Anatomic landmarks identified, introduced needle, incremental injection, negative aspiration for blood and anatomic landmarks palpated   Block outcome:  Anesthesia achieved Procedure type:    Complexity:  Simple Procedure  details:  Needle aspiration: no     Incision types:  Single straight   Scalpel blade:  11   Drainage:  Purulent   Drainage amount:  Scant   Wound treatment:  Wound left open   Packing materials:  None Post-procedure details:    Patient tolerance of procedure:  Tolerated well, no immediate complications   (including critical care time)  Medications Ordered in ED Medications  bacitracin ointment (not administered)  bupivacaine (MARCAINE) 0.5 % injection 10 mL (10 mLs Infiltration Given 05/10/17 1201)  sulfamethoxazole-trimethoprim (BACTRIM DS,SEPTRA DS) 800-160 MG per tablet 1 tablet (1 tablet Oral Given 05/10/17 1244)  phenazopyridine (PYRIDIUM) tablet 200 mg (200 mg Oral Given 05/10/17 1244)     Initial Impression / Assessment and Plan / ED Course  I have reviewed the triage vital signs and the nursing notes.  Pertinent labs & imaging results that were available during my care of the patient were reviewed by me and considered in my medical decision making (see chart for details).     Pt does have an UTI.  Urine culture sent as well.  Pt will be started on bactrim to cover both uti and paronychia.  Pt is stable for d/c.  Final Clinical Impressions(s) / ED Diagnoses   Final diagnoses:  Acute cystitis with hematuria  Paronychia, toe, left    ED Discharge Orders        Ordered    sulfamethoxazole-trimethoprim (BACTRIM DS,SEPTRA DS) 800-160 MG tablet  2 times daily     05/10/17 1351    phenazopyridine (PYRIDIUM) 200 MG tablet  3 times daily     05/10/17 1351       Isla Pence, MD 05/10/17 1400

## 2017-05-19 ENCOUNTER — Ambulatory Visit
Admission: RE | Admit: 2017-05-19 | Discharge: 2017-05-19 | Disposition: A | Discharge status: Home / Self Care | Payer: Medicare Other | Source: Ambulatory Visit | Attending: Radiation Oncology | Admitting: Radiation Oncology

## 2017-05-19 ENCOUNTER — Encounter: Payer: Self-pay | Admitting: Radiation Oncology

## 2017-05-19 ENCOUNTER — Other Ambulatory Visit: Payer: Self-pay

## 2017-05-19 VITALS — BP 142/89 | HR 82 | Temp 98.4°F | Resp 20 | Ht 59.0 in | Wt 218.8 lb

## 2017-05-19 DIAGNOSIS — K5900 Constipation, unspecified: Secondary | ICD-10-CM | POA: Insufficient documentation

## 2017-05-19 DIAGNOSIS — M545 Low back pain: Secondary | ICD-10-CM | POA: Diagnosis not present

## 2017-05-19 DIAGNOSIS — E669 Obesity, unspecified: Secondary | ICD-10-CM | POA: Diagnosis not present

## 2017-05-19 DIAGNOSIS — Z803 Family history of malignant neoplasm of breast: Secondary | ICD-10-CM | POA: Insufficient documentation

## 2017-05-19 DIAGNOSIS — C50511 Malignant neoplasm of lower-outer quadrant of right female breast: Secondary | ICD-10-CM

## 2017-05-19 DIAGNOSIS — Z8673 Personal history of transient ischemic attack (TIA), and cerebral infarction without residual deficits: Secondary | ICD-10-CM | POA: Insufficient documentation

## 2017-05-19 DIAGNOSIS — Z170 Estrogen receptor positive status [ER+]: Secondary | ICD-10-CM | POA: Insufficient documentation

## 2017-05-19 DIAGNOSIS — Z510 Encounter for antineoplastic radiation therapy: Secondary | ICD-10-CM | POA: Diagnosis not present

## 2017-05-19 DIAGNOSIS — Z9013 Acquired absence of bilateral breasts and nipples: Secondary | ICD-10-CM | POA: Diagnosis not present

## 2017-05-19 DIAGNOSIS — Z809 Family history of malignant neoplasm, unspecified: Secondary | ICD-10-CM | POA: Diagnosis not present

## 2017-05-19 DIAGNOSIS — G4730 Sleep apnea, unspecified: Secondary | ICD-10-CM | POA: Insufficient documentation

## 2017-05-19 DIAGNOSIS — C50811 Malignant neoplasm of overlapping sites of right female breast: Secondary | ICD-10-CM | POA: Diagnosis present

## 2017-05-19 DIAGNOSIS — E039 Hypothyroidism, unspecified: Secondary | ICD-10-CM | POA: Insufficient documentation

## 2017-05-19 DIAGNOSIS — C7951 Secondary malignant neoplasm of bone: Secondary | ICD-10-CM | POA: Insufficient documentation

## 2017-05-19 DIAGNOSIS — Z86010 Personal history of colonic polyps: Secondary | ICD-10-CM | POA: Diagnosis not present

## 2017-05-19 DIAGNOSIS — C778 Secondary and unspecified malignant neoplasm of lymph nodes of multiple regions: Secondary | ICD-10-CM | POA: Diagnosis not present

## 2017-05-19 DIAGNOSIS — Z800 Family history of malignant neoplasm of digestive organs: Secondary | ICD-10-CM | POA: Diagnosis not present

## 2017-05-19 NOTE — Progress Notes (Addendum)
Radiation Oncology         [phone] ________________________________  Name: Jasmine Hood MRN: [id number]  Date: 05/19/2017  DOB: Sep 24, 1945  [id number], Jasmine Sou, MD  Alphonsa Overall, MD     REFERRING PHYSICIAN: Alphonsa Overall, MD   DIAGNOSIS: The encounter diagnosis was Malignant neoplasm of overlapping sites of right breast in female, estrogen receptor positive ([place]).   HISTORY OF PRESENT ILLNESS: Jasmine Hood is a 71 y.o. female originally seen at the request of Dr. Lucia Gaskins for a new diagnosis of right breast cancer. The patient noted changes in her nipple inversion over time and presented for her mammogram on 10/03/16 which revealed a large 10 cm mass of the breast with sonographic concerns of two pathologic nodes in the low axilla, the largest measuring 2.4 cm. The left breast also had some indeterminate calcifications. The primary tumor measured 10 cm and appears to involve the upper outer quadrant and lower outer quadrant with skin involvement. 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 The axillary node was also positive. A left breast biopsy revealed fibroadenomatous changes. She was found to have stage IV disease with bony involvement in the lumbar spine, sternum, and ribs, and was offered neoadjuvant chemotherapy which she completed between 11/05/16-02/18/17. Her posttreatment MRI scan revealed improvement from a 10 cm mass with a 2.4 cm axillary node, to a 8 x 4 x 5 cm mass in the right breast, with a 1.7 x .9 cm axillary node. She did have disease in the lumbar spine, and some persistent disease in the skeletal system. She has a 2.1 cm hypermetabolic nodule posteriorly in the right lower lobe as well. She did undergo bilateral mastectomies on 04/04/17 with targeted right axillary dissection. 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 She returns today to discuss completion of local therapy and will begin antiestrogen at completion of her treatment.    PREVIOUS RADIATION THERAPY: No   PAST MEDICAL HISTORY:  Past Medical History:  Diagnosis Date  . Breast cancer ([place]) 10/07/2016   right breast, spine  . Brown recluse spider bite    right leg  . COLONIC POLYPS, HX OF 01/05/2009   Qualifier: Diagnosis of  By: [name]  MD, Jasmine Hood   . Complication of anesthesia    slow to awaken after wisdom  teeth extraction age 66  . Constipation   . Genetic testing 11/27/2016   Ms. Lufkin underwent genetic counseling and testing for hereditary cancer syndromes on 11/19/2016. Her results were negative for mutations in all 46 genes analyzed by [facility]'s 46-gene Common Hereditary Cancers Panel. Genes analyzed include: APC, ATM, AXIN2, BARD1, BMPR1A, BRCA1, BRCA2, BRIP1, CDH1, CDKN2A, CHEK2, CTNNA1, DICER1, EPCAM, GREM1, HOXB13, KIT, MEN1, MLH1, MSH2, MSH3, MSH6, MUTYH, NBN,  . HYPOTHYROIDISM 02/01/2010   Qualifier: Diagnosis of  By: [name]  MD, Jasmine Hood   . Morbid obesity ([place]) 01/05/2009   Qualifier: Diagnosis of  By: [name]  MD, Jasmine Hood   . Pneumonia 1987  . Sleep apnea    no cpap used   . Stroke [facility])    found on CT scan       PAST SURGICAL HISTORY: Past Surgical History:  Procedure Laterality Date  . BILATERAL TOTAL MASTECTOMY WITH AXILLARY LYMPH NODE DISSECTION Bilateral 04/04/2017  Procedure: RIGHT MASTECTOMY WITH TARGETED RIGHT AXILLARY NODE DISSECTION AND LEFT PROPHYLATIC MASTECTOMY ERAS PATHWAY;  Surgeon: Alphonsa Overall, MD;  Location: [place];  Service: General;  Laterality: Bilateral;  PEC BLOCK  . BREAST BIOPSY Right 10/07/2016   invasive mammary carcinoma  . BREAST BIOPSY Left 10/08/2016   breast fibroadenoma no malignancy  . CATARACT EXTRACTION Bilateral   . COLONOSCOPY W/ BIOPSIES AND POLYPECTOMY  2012  . COLONOSCOPY WITH  PROPOFOL N/A 12/22/2015   Procedure: COLONOSCOPY WITH PROPOFOL;  Surgeon: [name], MD;  Location: [facility];  Service: Endoscopy;  Laterality: N/A;  . INCISION AND DRAINAGE Right 1998   surgery for spider bite on RLE; "[name]"  . MASTECTOMY Left 04/04/2017   [name]  . MASTECTOMY WITH AXILLARY LYMPH NODE DISSECTION Right 04/04/2017  . MOLE REMOVAL  10/29/2016   Procedure: MOLE REMOVAL LEFT CHEST;  Surgeon: Alphonsa Overall, MD;  Location: [facility];  Service: General;;  . PORTACATH PLACEMENT N/A 10/29/2016   Procedure: INSERTION PORT-A-CATH;  Surgeon: Alphonsa Overall, MD;  Location[facility];  Service: General;  Laterality: N/A;  . TUBAL LIGATION    . WISDOM TOOTH EXTRACTION  age 5     FAMILY HISTORY:  Family History  Problem Relation Age of Onset  . Breast cancer Mother 24       d.52 from metastatic disease  . Throat cancer Father        d.65 history of smoking  . Rheum arthritis Sister        3 sisters pos. for osteo and RA  . Diabetes Maternal Aunt   . Cancer Maternal Grandfather        mouth cancer-chewed tobacco  . Breast cancer Cousin 86  . Breast cancer Daughter 37  . Stomach cancer Sister 62     SOCIAL HISTORY:  reports that  has never smoked. she has never used smokeless tobacco. She reports that she does not drink alcohol or use drugs. The patient is widowed and accompanied by her friend.   ALLERGIES: Bee venom   MEDICATIONS:  Current Outpatient Medications  Medication Sig Dispense Refill  . HYDROcodone-acetaminophen (NORCO/VICODIN) 5-325 MG tablet Take 1-2 tablets by mouth every 6 (six) hours as needed for moderate pain. 90 tablet 0  . phenazopyridine (PYRIDIUM) 200 MG tablet Take 1 tablet (200 mg total) by mouth 3 (three) times daily. 6 tablet 0  . valACYclovir (VALTREX) 500 MG tablet Take 1 tablet (500 mg total) by mouth daily. 30 tablet 6   No current facility-administered medications for this encounter.      REVIEW OF SYSTEMS: On review of  systems, the patient reports that she is happy that she's starting to feel somewhat back to normal. She denies any chest wall pain, but is not pleased that her lateral aspect of her incisions have redundancy. She is doing well overall. She denies any chest pain, shortness of breath, cough, fevers, chills, night sweats, unintended weight changes. She denies any bowel or bladder disturbances, and denies abdominal pain, nausea or vomiting. She denies any new musculoskeletal or joint aches or pains, new skin lesions or concerns. A complete review of systems is obtained and is otherwise negative.      PHYSICAL EXAM:  Wt Readings from Last 3 Encounters:  05/19/17 218 lb 12.8 oz (99.2 kg)  05/01/17 225 lb 3.2 oz (102.2 kg)  04/04/17 229 lb 1.6 oz (103.9 kg)   Temp Readings from Last 3 Encounters:  05/19/17 98.4 F (36.9 C) (  Oral)  05/10/17 98.4 F (36.9 C) (Oral)  05/01/17 98.1 F (36.7 C) (Oral)   BP Readings from Last 3 Encounters:  05/19/17 (!) 142/89  05/10/17 (!) 158/78  05/01/17 118/78   Pulse Readings from Last 3 Encounters:  05/19/17 82  05/10/17 82  05/01/17 (!) 104   Pain Assessment Pain Score: 7  Pain Loc: Back(low back)/10  In general this is a well appearing caucasian female in no acute distress. She's alert and oriented x4 and appropriate throughout the examination. Cardiopulmonary assessment is negative for acute distress and she exhibits normal effort. Bilateral mastectomy sites are intact and well healed without drains or chest wall edema. No erythema is noted.      ECOG = 1  0 - Asymptomatic (Fully active, able to carry on all predisease activities without restriction)  1 - Symptomatic but completely ambulatory (Restricted in physically strenuous activity but ambulatory and able to carry out work of a light or sedentary nature. For example, light housework, office work)  2 - Symptomatic, <50% in bed during the day (Ambulatory and capable of all self care but  unable to carry out any work activities. Up and about more than 50% of waking hours)  3 - Symptomatic, >50% in bed, but not bedbound (Capable of only limited self-care, confined to bed or chair 50% or more of waking hours)  4 - Bedbound (Completely disabled. Cannot carry on any self-care. Totally confined to bed or chair)  5 - Death   Eustace Pen MM, Creech RH, Tormey DC, et al. [phone]). "Toxicity and response criteria of the [facility]". [place] Oncol. 5 (6): 649-55    LABORATORY DATA:  Lab Results  Component Value Date   WBC 7.1 05/10/2017   HGB 12.4 05/10/2017   HCT 38.4 05/10/2017   MCV 107.0 (H) 05/10/2017   PLT 216 05/10/2017   Lab Results  Component Value Date   NA 139 05/10/2017   K 3.8 05/10/2017   CL 106 05/10/2017   CO2 23 05/10/2017   Lab Results  Component Value Date   ALT 29 03/21/2017   AST 53 (H) 03/21/2017   ALKPHOS 123 03/21/2017   BILITOT 0.55 03/21/2017      RADIOGRAPHY: No results found.     IMPRESSION/PLAN: 1. 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 We reviewed her final pathology. Her left breast has been addressed surgically and I will confirm with Dr. Lisbeth Renshaw that there's no indication for treatment. We reviewed her initial clinical findings prior to neoadjuvant treatment and given her nodal involvement, large tumor, and positive margin following surgery, she would be candidate for adjuvant treatment.  We discussed the risks, benefits, short and long term effects of treatment, as well as  the delivery and logistics of radiotherapy and would recommend 6 1/2 weeks to the chest wall and regional nodes. Written consent is obtained and placed in the chart, a copy was provided to the patient. She will simulate on Thursday May 22, 2017 at 2pm. 2. Low back pain. She will follow up with her PCP for management of this, but I will also discuss with Dr. Lisbeth Renshaw to  see if he would offer radiotherapy to L1 as well. I will notify her of this at the time of her simulation.     In a visit lasting 25 minutes, greater than 50% of the time was spent face to face discussing her  pathology and treatment course, and coordinating the patient's care.    Carola Rhine, PAC

## 2017-05-22 ENCOUNTER — Ambulatory Visit
Admission: RE | Admit: 2017-05-22 | Discharge: 2017-05-22 | Disposition: A | Discharge status: Home / Self Care | Payer: Medicare Other | Source: Ambulatory Visit | Attending: Radiation Oncology | Admitting: Radiation Oncology

## 2017-05-22 DIAGNOSIS — Z170 Estrogen receptor positive status [ER+]: Principal | ICD-10-CM

## 2017-05-22 DIAGNOSIS — C50811 Malignant neoplasm of overlapping sites of right female breast: Secondary | ICD-10-CM | POA: Diagnosis not present

## 2017-05-23 DIAGNOSIS — C50811 Malignant neoplasm of overlapping sites of right female breast: Secondary | ICD-10-CM | POA: Diagnosis not present

## 2017-05-30 ENCOUNTER — Ambulatory Visit: Payer: Medicare Other | Admitting: Radiation Oncology

## 2017-06-02 ENCOUNTER — Ambulatory Visit
Admission: RE | Admit: 2017-06-02 | Discharge: 2017-06-02 | Disposition: A | Discharge status: Home / Self Care | Payer: Medicare Other | Source: Ambulatory Visit | Attending: Radiation Oncology | Admitting: Radiation Oncology

## 2017-06-02 DIAGNOSIS — C50911 Malignant neoplasm of unspecified site of right female breast: Secondary | ICD-10-CM

## 2017-06-02 DIAGNOSIS — C50811 Malignant neoplasm of overlapping sites of right female breast: Secondary | ICD-10-CM | POA: Diagnosis not present

## 2017-06-02 MED ORDER — RADIAPLEXRX EX GEL
Freq: Once | CUTANEOUS | Status: AC
  Administered 2017-06-02: 10:00:00 via TOPICAL

## 2017-06-02 MED ORDER — ALRA NON-METALLIC DEODORANT (RAD-ONC)
1.0000 "application " | Freq: Once | TOPICAL | Status: AC
  Administered 2017-06-02: 1 via TOPICAL

## 2017-06-02 NOTE — Progress Notes (Signed)
Pt here for patient teaching.  Pt given Radiation and You booklet, skin care instructions, Alra deodorant and Radiaplex gel.  Reviewed areas of pertinence such as fatigue, skin changes, throat changes, cough and shortness of breath . Pt able to give teach back of to pat skin, use unscented/gentle soap and drink plenty of water,apply Radiaplex bid and avoid applying anything to skin within 4 hours of treatment. Pt demonstrated understanding, needs reinforcement, no evidence of learning, refused teaching and of information given and will contact nursing with any questions or concerns.     Http://rtanswers.org/treatmentinformation/whattoexpect/index

## 2017-06-03 ENCOUNTER — Ambulatory Visit
Admission: RE | Admit: 2017-06-03 | Discharge: 2017-06-03 | Disposition: A | Discharge status: Home / Self Care | Payer: Medicare Other | Source: Ambulatory Visit | Attending: Radiation Oncology | Admitting: Radiation Oncology

## 2017-06-03 DIAGNOSIS — C50811 Malignant neoplasm of overlapping sites of right female breast: Secondary | ICD-10-CM | POA: Diagnosis not present

## 2017-06-04 ENCOUNTER — Ambulatory Visit
Admission: RE | Admit: 2017-06-04 | Discharge: 2017-06-04 | Disposition: A | Discharge status: Home / Self Care | Payer: Medicare Other | Source: Ambulatory Visit | Attending: Radiation Oncology | Admitting: Radiation Oncology

## 2017-06-04 DIAGNOSIS — C50811 Malignant neoplasm of overlapping sites of right female breast: Secondary | ICD-10-CM | POA: Diagnosis not present

## 2017-06-05 ENCOUNTER — Ambulatory Visit
Admission: RE | Admit: 2017-06-05 | Discharge: 2017-06-05 | Disposition: A | Discharge status: Home / Self Care | Payer: Medicare Other | Source: Ambulatory Visit | Attending: Radiation Oncology | Admitting: Radiation Oncology

## 2017-06-05 DIAGNOSIS — C50811 Malignant neoplasm of overlapping sites of right female breast: Secondary | ICD-10-CM | POA: Diagnosis not present

## 2017-06-05 DIAGNOSIS — C50911 Malignant neoplasm of unspecified site of right female breast: Secondary | ICD-10-CM

## 2017-06-05 MED ORDER — RADIAPLEXRX EX GEL
Freq: Once | CUTANEOUS | Status: AC
  Administered 2017-06-05: 14:00:00 via TOPICAL

## 2017-06-06 ENCOUNTER — Ambulatory Visit
Admission: RE | Admit: 2017-06-06 | Discharge: 2017-06-06 | Disposition: A | Discharge status: Home / Self Care | Payer: Medicare Other | Source: Ambulatory Visit | Attending: Radiation Oncology | Admitting: Radiation Oncology

## 2017-06-06 ENCOUNTER — Ambulatory Visit: Payer: Medicare Other

## 2017-06-06 DIAGNOSIS — C50811 Malignant neoplasm of overlapping sites of right female breast: Secondary | ICD-10-CM | POA: Diagnosis not present

## 2017-06-09 ENCOUNTER — Other Ambulatory Visit: Payer: Self-pay | Admitting: Radiation Oncology

## 2017-06-09 ENCOUNTER — Ambulatory Visit (INDEPENDENT_AMBULATORY_CARE_PROVIDER_SITE_OTHER): Payer: Medicare Other | Admitting: Internal Medicine

## 2017-06-09 ENCOUNTER — Encounter: Payer: Self-pay | Admitting: Internal Medicine

## 2017-06-09 ENCOUNTER — Ambulatory Visit
Admission: RE | Admit: 2017-06-09 | Discharge: 2017-06-09 | Disposition: A | Discharge status: Home / Self Care | Payer: Medicare Other | Source: Ambulatory Visit | Attending: Radiation Oncology | Admitting: Radiation Oncology

## 2017-06-09 VITALS — BP 110/70 | HR 91 | Temp 97.9°F | Ht 59.0 in | Wt 217.0 lb

## 2017-06-09 DIAGNOSIS — L0390 Cellulitis, unspecified: Secondary | ICD-10-CM | POA: Insufficient documentation

## 2017-06-09 DIAGNOSIS — C50811 Malignant neoplasm of overlapping sites of right female breast: Secondary | ICD-10-CM | POA: Diagnosis not present

## 2017-06-09 DIAGNOSIS — T300 Burn of unspecified body region, unspecified degree: Secondary | ICD-10-CM

## 2017-06-09 DIAGNOSIS — L03818 Cellulitis of other sites: Secondary | ICD-10-CM

## 2017-06-09 DIAGNOSIS — T22242A Burn of second degree of left axilla, initial encounter: Secondary | ICD-10-CM | POA: Diagnosis not present

## 2017-06-09 DIAGNOSIS — L03032 Cellulitis of left toe: Secondary | ICD-10-CM | POA: Diagnosis not present

## 2017-06-09 MED ORDER — SILVER SULFADIAZINE 1 % EX CREA: 1.0000 "application " | TOPICAL_CREAM | Freq: Two times a day (BID) | CUTANEOUS | 0 refills | Status: DC

## 2017-06-09 MED ORDER — HYDROCODONE-ACETAMINOPHEN 5-325 MG PO TABS: 1.0000 | ORAL_TABLET | Freq: Four times a day (QID) | ORAL | 0 refills | Status: DC | PRN

## 2017-06-09 MED ORDER — SILVER SULFADIAZINE 1 % EX CREA
TOPICAL_CREAM | Freq: Two times a day (BID) | CUTANEOUS | Status: DC
  Administered 2017-06-09: 10:00:00 via TOPICAL

## 2017-06-09 MED ORDER — CEPHALEXIN 500 MG PO CAPS: 500.0000 mg | ORAL_CAPSULE | Freq: Three times a day (TID) | ORAL | 0 refills | Status: DC

## 2017-06-09 NOTE — Progress Notes (Signed)
The patient was seen today following treatment. Last week she developed a skin burn along the left chest wall in an area we are not treating following using a warm rice pack. She has evidence of eschar and mild cellulitic streaking, see nursing note per London Pepper, RN. We will use silvadene cream and started po Keflex for cellulitis.

## 2017-06-09 NOTE — Progress Notes (Signed)
   Patient presented to the clinic today following radiation therapy to her right supraclavicular area. Patient concerned about infection to a wound on her left side dogear. Patient reports several days ago she heated a rice pack and applied it to her left shoulder for pain relief. She goes onto explain that during the night the rice pack slid down to her side creating this burn. Reports for the last week she has been apply neosporin to the affected area. Fever felt only immediately around the wound. Per Shona Simpson, PA-C request this RN provided the patient with SSD and directed upon use. Also, Bryson Ha escribed Keflex to the patient's pharmacy. Patient discharged ambulatory with family member in no distress.
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
## 2017-06-09 NOTE — Patient Instructions (Addendum)
Paronychia Paronychia is an infection of the skin that surrounds a nail. It usually affects the skin around a fingernail, but it may also occur near a toenail. It often causes pain and swelling around the nail. This condition may come on suddenly or develop over a longer period. In some cases, a collection of pus (abscess) can form near or under the nail. Usually, paronychia is not serious and it clears up with treatment. What are the causes? This condition may be caused by bacteria or fungi. It is commonly caused by either Streptococcus or Staphylococcus bacteria. The bacteria or fungi often cause the infection by getting into the affected area through an opening in the skin, such as a cut or a hangnail. What increases the risk? This condition is more likely to develop in:  People who get their hands wet often, such as those who work as Designer, industrial/product, bartenders, or nurses.  People who bite their fingernails or suck their thumbs.  People who trim their nails too short.  People who have hangnails or injured fingertips.  People who get manicures.  People who have diabetes.  What are the signs or symptoms? Symptoms of this condition include:  Redness and swelling of the skin near the nail.  Tenderness around the nail when you touch the area.  Pus-filled bumps under the cuticle. The cuticle is the skin at the base or sides of the nail.  Fluid or pus under the nail.  Throbbing pain in the area.  How is this diagnosed? This condition is usually diagnosed with a physical exam. In some cases, a sample of pus may be taken from an abscess to be tested in a lab. This can help to determine what type of bacteria or fungi is causing the condition. How is this treated? Treatment for this condition depends on the cause and severity of the condition. If the condition is mild, it may clear up on its own in a few days. Your health care provider may recommend soaking the affected area in warm water a  few times a day. When treatment is needed, the options may include:  Antibiotic medicine, if the condition is caused by a bacterial infection.  Antifungal medicine, if the condition is caused by a fungal infection.  Incision and drainage, if an abscess is present. In this procedure, the health care provider will cut open the abscess so the pus can drain out.  Follow these instructions at home:  Soak the affected area in warm water if directed to do so by your health care provider. You may be told to do this for 20 minutes, 2-3 times a day. Keep the area dry in between soakings.  Take medicines only as directed by your health care provider.  If you were prescribed an antibiotic medicine, finish all of it even if you start to feel better.  Keep the affected area clean.  Do not try to drain a fluid-filled bump yourself.  If you will be washing dishes or performing other tasks that require your hands to get wet, wear rubber gloves. You should also wear gloves if your hands might come in contact with irritating substances, such as cleaners or chemicals.  Follow your health care provider's instructions about: ? Wound care. ? Bandage (dressing) changes and removal. Contact a health care provider if:  Your symptoms get worse or do not improve with treatment.  You have a fever or chills.  You have redness spreading from the affected area.  You have continued  or increased fluid, blood, or pus coming from the affected area.  Your finger or knuckle becomes swollen or is difficult to move.   Clean your wound 2-3 times a day or as often as directed. ? Wash the wound with mild soap and water. ? Rinse the wound with water to remove all soap. ? Pat the wound dry with a clean towel. Do not rub it.  Check your wound every day for signs of infection. Check for: ? More redness, swelling, or pain. ? More fluid or blood. ? Warmth. ? Pus or a bad smell. ? Yellow or green fluid.  Do not  scratch or pick at the wound  Do not break any blisters or peel any skin.

## 2017-06-10 ENCOUNTER — Ambulatory Visit
Admission: RE | Admit: 2017-06-10 | Discharge: 2017-06-10 | Disposition: A | Discharge status: Home / Self Care | Payer: Medicare Other | Source: Ambulatory Visit | Attending: Radiation Oncology | Admitting: Radiation Oncology

## 2017-06-10 DIAGNOSIS — C50811 Malignant neoplasm of overlapping sites of right female breast: Secondary | ICD-10-CM | POA: Diagnosis not present

## 2017-06-11 ENCOUNTER — Ambulatory Visit
Admission: RE | Admit: 2017-06-11 | Discharge: 2017-06-11 | Disposition: A | Discharge status: Home / Self Care | Payer: Medicare Other | Source: Ambulatory Visit | Attending: Radiation Oncology | Admitting: Radiation Oncology

## 2017-06-11 DIAGNOSIS — C50811 Malignant neoplasm of overlapping sites of right female breast: Secondary | ICD-10-CM | POA: Diagnosis not present

## 2017-06-12 ENCOUNTER — Ambulatory Visit
Admission: RE | Admit: 2017-06-12 | Discharge: 2017-06-12 | Disposition: A | Discharge status: Home / Self Care | Payer: Medicare Other | Source: Ambulatory Visit | Attending: Radiation Oncology | Admitting: Radiation Oncology

## 2017-06-12 DIAGNOSIS — C50811 Malignant neoplasm of overlapping sites of right female breast: Secondary | ICD-10-CM | POA: Diagnosis not present

## 2017-06-13 ENCOUNTER — Ambulatory Visit
Admission: RE | Admit: 2017-06-13 | Discharge: 2017-06-13 | Disposition: A | Discharge status: Home / Self Care | Payer: Medicare Other | Source: Ambulatory Visit | Attending: Radiation Oncology | Admitting: Radiation Oncology

## 2017-06-13 ENCOUNTER — Telehealth: Payer: Self-pay | Admitting: Internal Medicine

## 2017-06-13 DIAGNOSIS — C50811 Malignant neoplasm of overlapping sites of right female breast: Secondary | ICD-10-CM | POA: Diagnosis not present

## 2017-06-13 DIAGNOSIS — C50911 Malignant neoplasm of unspecified site of right female breast: Secondary | ICD-10-CM

## 2017-06-13 MED ORDER — RADIAPLEXRX EX GEL
Freq: Once | CUTANEOUS | Status: AC
  Administered 2017-06-13: 14:00:00 via TOPICAL

## 2017-06-13 NOTE — Progress Notes (Signed)
  Radiation Oncology         [phone]) [phone] ________________________________  Name: RECHELLE NIEBLA MRN: [id number]  Date: 05/22/2017  DOB: August 28, 1945  Optical Surface Tracking Plan:  Since intensity modulated radiotherapy (IMRT) and 3D conformal radiation treatment methods are predicated on accurate and precise positioning for treatment, intrafraction motion monitoring is medically necessary to ensure accurate and safe treatment delivery.  The ability to quantify intrafraction motion without excessive ionizing radiation dose can only be performed with optical surface tracking. Accordingly, surface imaging offers the opportunity to obtain 3D measurements of patient position throughout IMRT and 3D treatments without excessive radiation exposure.  I am ordering optical surface tracking for this patient's upcoming course of radiotherapy. ________________________________  Kyung Rudd, MD 06/13/2017 4:42 PM    Reference:   Particia Jasper, et al. Surface imaging-based analysis of intrafraction motion for breast radiotherapy patients.Journal of [place], n. 6, nov. 2014. ISSN [id number].   Available at: <http://www.jacmp.org/index.php/jacmp/article/view/4957>.

## 2017-06-13 NOTE — Progress Notes (Signed)
  Radiation Oncology         [id number]) [phone] ________________________________  Name: Jasmine Hood MRN: [id number]  Date: 05/22/2017  DOB: Dec 17, 1945  DIAGNOSIS:     ICD-10-CM   1. Malignant neoplasm of overlapping sites of right breast in female, estrogen receptor positive ([place]) C50.811    Z17.0      SIMULATION AND TREATMENT PLANNING NOTE  The patient presented for simulation prior to beginning her course of radiation treatment for her diagnosis of right-sided breast cancer. The patient was placed in a supine position on a breast board. A customized vac-lock bag was also constructed and this complex treatment device will be used on a daily basis during her treatment. In this fashion, a CT scan was obtained through the chest area and an isocenter was placed near the chest wall at the upper aspect of the right chest.  The patient will be planned to receive a course of radiation initially to a dose of 50.4 gray. This will consist of a 4 field technique targeting the right chest wall as well as the supraclavicular region. Therefore 2 customized medial and lateral tangent fields have been created targeting the chest wall, and also 2 additional customized fields have been designed to treat the supraclavicular region both with a right supraclavicular field and a right posterior axillary boost field. A forward planning/reduced field technique will also be evaluated to determine if this significantly improves the dose homogeneity of the overall plan. Therefore, additional customized blocks/fields may be necessary.  This initial treatment will be accomplished at 1.8 gray per fraction.   The initial plan will consist of a 3-D conformal technique. The target volume/scar, heart and lungs have been contoured and dose volume histograms of each of these structures will be evaluated as part of the 3-D conformal treatment planning process.   It is anticipated that the patient will then receive a 10 gray boost to the  surgical scar. This will be accomplished at 2 gray per fraction. The final anticipated total dose therefore will correspond to 60.4 gray.    _______________________________   Jodelle Gross, MD, PhD

## 2017-06-13 NOTE — Telephone Encounter (Signed)
Copied from [place] [phone]. Topic: Quick Communication - See Telephone Encounter >> Jun 13, 2017 10:24 AM Aurelio Brash B wrote: CRM for notification. See Telephone encounter for:  [name], RN at [place] at home needs verbal orders  for 2 visits,  one this week  and one next week  her contact number is  [phone] 06/13/17.

## 2017-06-16 ENCOUNTER — Ambulatory Visit
Admission: RE | Admit: 2017-06-16 | Discharge: 2017-06-16 | Disposition: A | Discharge status: Home / Self Care | Payer: Medicare Other | Source: Ambulatory Visit | Attending: Radiation Oncology | Admitting: Radiation Oncology

## 2017-06-16 DIAGNOSIS — C50811 Malignant neoplasm of overlapping sites of right female breast: Secondary | ICD-10-CM | POA: Diagnosis not present

## 2017-06-16 NOTE — Telephone Encounter (Signed)
ok 

## 2017-06-16 NOTE — Telephone Encounter (Signed)
Spoke with [name] and advised. Nothing further needed.

## 2017-06-16 NOTE — Telephone Encounter (Signed)
Burnis Medin, [place] 06/16/2017 09:31 AM  Summary: Verbal Orders    Jasmine Hood is calling back to get verbal orders for patient. Orders are for home health visits one time a week for one week. Pls call back at [phone]    Dr. Raliegh Ip is not in office.  Dr. Sherren Mocha - Please advise. Thanks!

## 2017-06-17 ENCOUNTER — Ambulatory Visit
Admission: RE | Admit: 2017-06-17 | Discharge: 2017-06-17 | Disposition: A | Discharge status: Home / Self Care | Payer: Medicare Other | Source: Ambulatory Visit | Attending: Radiation Oncology | Admitting: Radiation Oncology

## 2017-06-17 DIAGNOSIS — C50811 Malignant neoplasm of overlapping sites of right female breast: Secondary | ICD-10-CM | POA: Diagnosis not present

## 2017-06-18 ENCOUNTER — Ambulatory Visit
Admission: RE | Admit: 2017-06-18 | Discharge: 2017-06-18 | Disposition: A | Discharge status: Home / Self Care | Payer: Medicare Other | Source: Ambulatory Visit | Attending: Radiation Oncology | Admitting: Radiation Oncology

## 2017-06-18 DIAGNOSIS — C50811 Malignant neoplasm of overlapping sites of right female breast: Secondary | ICD-10-CM | POA: Diagnosis not present

## 2017-06-19 ENCOUNTER — Ambulatory Visit
Admission: RE | Admit: 2017-06-19 | Discharge: 2017-06-19 | Disposition: A | Discharge status: Home / Self Care | Payer: Medicare Other | Source: Ambulatory Visit | Attending: Radiation Oncology | Admitting: Radiation Oncology

## 2017-06-19 ENCOUNTER — Telehealth: Payer: Self-pay | Admitting: Internal Medicine

## 2017-06-19 DIAGNOSIS — C50811 Malignant neoplasm of overlapping sites of right female breast: Secondary | ICD-10-CM | POA: Diagnosis not present

## 2017-06-19 NOTE — Telephone Encounter (Signed)
Can Dr. Carita Pian take a look at this since PCP is out of office?

## 2017-06-19 NOTE — Telephone Encounter (Signed)
Please advise 
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
## 2017-06-19 NOTE — Telephone Encounter (Signed)
Ok

## 2017-06-20 ENCOUNTER — Ambulatory Visit
Admission: RE | Admit: 2017-06-20 | Discharge: 2017-06-20 | Disposition: A | Discharge status: Home / Self Care | Payer: Medicare Other | Source: Ambulatory Visit | Attending: Radiation Oncology | Admitting: Radiation Oncology

## 2017-06-20 ENCOUNTER — Telehealth: Payer: Self-pay | Admitting: Internal Medicine

## 2017-06-20 DIAGNOSIS — C50811 Malignant neoplasm of overlapping sites of right female breast: Secondary | ICD-10-CM | POA: Diagnosis not present

## 2017-06-20 NOTE — Telephone Encounter (Signed)
Copied from [place] [place]. Topic: General - Other >> Jun 20, 2017 12:12 PM Patrice Paradise wrote: Reason for CRM: Erasmo Downer w/[facility] [phone] calling to get verbal orders to continue to see the patient 1 week for 4 weeks.

## 2017-06-20 NOTE — Telephone Encounter (Signed)
Spoke with Kendrice from hospice regarding Dr. Volanda Napoleon giving the verbal order for patient. Nothing further needed.

## 2017-06-20 NOTE — Telephone Encounter (Signed)
Ok to give verbal orders? Please advise  

## 2017-06-23 ENCOUNTER — Ambulatory Visit: Admission: RE | Admit: 2017-06-23 | Payer: Medicare Other | Source: Ambulatory Visit

## 2017-06-23 ENCOUNTER — Ambulatory Visit: Payer: Medicare HMO | Admitting: Internal Medicine

## 2017-06-23 NOTE — Telephone Encounter (Signed)
Kristen following on verbal orders and needs today please

## 2017-06-24 ENCOUNTER — Ambulatory Visit
Admission: RE | Admit: 2017-06-24 | Discharge: 2017-06-24 | Disposition: A | Discharge status: Home / Self Care | Payer: Medicare Other | Source: Ambulatory Visit | Attending: Radiation Oncology | Admitting: Radiation Oncology

## 2017-06-24 DIAGNOSIS — C50811 Malignant neoplasm of overlapping sites of right female breast: Secondary | ICD-10-CM | POA: Diagnosis not present

## 2017-06-24 NOTE — Telephone Encounter (Signed)
Okay 

## 2017-06-25 ENCOUNTER — Ambulatory Visit
Admission: RE | Admit: 2017-06-25 | Discharge: 2017-06-25 | Disposition: A | Discharge status: Home / Self Care | Payer: Medicare Other | Source: Ambulatory Visit | Attending: Radiation Oncology | Admitting: Radiation Oncology

## 2017-06-25 DIAGNOSIS — C50811 Malignant neoplasm of overlapping sites of right female breast: Secondary | ICD-10-CM | POA: Diagnosis not present

## 2017-06-25 NOTE — Telephone Encounter (Signed)
Verbal orders given to [facility].

## 2017-06-26 ENCOUNTER — Ambulatory Visit
Admission: RE | Admit: 2017-06-26 | Discharge: 2017-06-26 | Disposition: A | Discharge status: Home / Self Care | Payer: Medicare Other | Source: Ambulatory Visit | Attending: Radiation Oncology | Admitting: Radiation Oncology

## 2017-06-26 DIAGNOSIS — Z170 Estrogen receptor positive status [ER+]: Principal | ICD-10-CM

## 2017-06-26 DIAGNOSIS — C50811 Malignant neoplasm of overlapping sites of right female breast: Secondary | ICD-10-CM | POA: Diagnosis not present

## 2017-06-26 MED ORDER — RADIAPLEXRX EX GEL
Freq: Once | CUTANEOUS | Status: AC
  Administered 2017-06-26: 13:00:00 via TOPICAL

## 2017-06-27 ENCOUNTER — Ambulatory Visit
Admission: RE | Admit: 2017-06-27 | Discharge: 2017-06-27 | Disposition: A | Discharge status: Home / Self Care | Payer: Medicare Other | Source: Ambulatory Visit | Attending: Radiation Oncology | Admitting: Radiation Oncology

## 2017-06-27 DIAGNOSIS — C50811 Malignant neoplasm of overlapping sites of right female breast: Secondary | ICD-10-CM | POA: Diagnosis not present

## 2017-06-30 ENCOUNTER — Ambulatory Visit
Admission: RE | Admit: 2017-06-30 | Discharge: 2017-06-30 | Disposition: A | Discharge status: Home / Self Care | Payer: Medicare Other | Source: Ambulatory Visit | Attending: Radiation Oncology | Admitting: Radiation Oncology

## 2017-06-30 DIAGNOSIS — Z170 Estrogen receptor positive status [ER+]: Principal | ICD-10-CM

## 2017-06-30 DIAGNOSIS — C50811 Malignant neoplasm of overlapping sites of right female breast: Secondary | ICD-10-CM | POA: Diagnosis not present

## 2017-06-30 MED ORDER — RADIAPLEXRX EX GEL
Freq: Once | CUTANEOUS | Status: AC
  Administered 2017-06-30: 14:00:00 via TOPICAL

## 2017-07-01 ENCOUNTER — Ambulatory Visit
Admission: RE | Admit: 2017-07-01 | Discharge: 2017-07-01 | Disposition: A | Discharge status: Home / Self Care | Payer: Medicare Other | Source: Ambulatory Visit | Attending: Radiation Oncology | Admitting: Radiation Oncology

## 2017-07-01 DIAGNOSIS — C50811 Malignant neoplasm of overlapping sites of right female breast: Secondary | ICD-10-CM | POA: Diagnosis not present

## 2017-07-02 ENCOUNTER — Ambulatory Visit
Admission: RE | Admit: 2017-07-02 | Discharge: 2017-07-02 | Disposition: A | Discharge status: Home / Self Care | Payer: Medicare Other | Source: Ambulatory Visit | Attending: Radiation Oncology | Admitting: Radiation Oncology

## 2017-07-02 ENCOUNTER — Ambulatory Visit: Payer: Medicare Other | Admitting: Internal Medicine

## 2017-07-02 DIAGNOSIS — C50811 Malignant neoplasm of overlapping sites of right female breast: Secondary | ICD-10-CM | POA: Diagnosis not present

## 2017-07-03 ENCOUNTER — Ambulatory Visit
Admission: RE | Admit: 2017-07-03 | Discharge: 2017-07-03 | Disposition: A | Discharge status: Home / Self Care | Payer: Medicare Other | Source: Ambulatory Visit | Attending: Radiation Oncology | Admitting: Radiation Oncology

## 2017-07-03 DIAGNOSIS — Z170 Estrogen receptor positive status [ER+]: Principal | ICD-10-CM

## 2017-07-03 DIAGNOSIS — C50811 Malignant neoplasm of overlapping sites of right female breast: Secondary | ICD-10-CM | POA: Diagnosis present

## 2017-07-03 MED ORDER — RADIAPLEXRX EX GEL
Freq: Once | CUTANEOUS | Status: AC
  Administered 2017-07-03: 13:00:00 via TOPICAL

## 2017-07-04 ENCOUNTER — Ambulatory Visit
Admission: RE | Admit: 2017-07-04 | Discharge: 2017-07-04 | Disposition: A | Discharge status: Home / Self Care | Payer: Medicare Other | Source: Ambulatory Visit | Attending: Radiation Oncology | Admitting: Radiation Oncology

## 2017-07-04 ENCOUNTER — Ambulatory Visit: Payer: Medicare Other | Admitting: Radiation Oncology

## 2017-07-04 DIAGNOSIS — C50811 Malignant neoplasm of overlapping sites of right female breast: Secondary | ICD-10-CM | POA: Diagnosis not present

## 2017-07-07 ENCOUNTER — Ambulatory Visit
Admission: RE | Admit: 2017-07-07 | Discharge: 2017-07-07 | Disposition: A | Discharge status: Home / Self Care | Payer: Medicare Other | Source: Ambulatory Visit | Attending: Radiation Oncology | Admitting: Radiation Oncology

## 2017-07-07 DIAGNOSIS — C50811 Malignant neoplasm of overlapping sites of right female breast: Secondary | ICD-10-CM | POA: Diagnosis not present

## 2017-07-08 ENCOUNTER — Ambulatory Visit
Admission: RE | Admit: 2017-07-08 | Discharge: 2017-07-08 | Disposition: A | Discharge status: Home / Self Care | Payer: Medicare Other | Source: Ambulatory Visit | Attending: Radiation Oncology | Admitting: Radiation Oncology

## 2017-07-08 DIAGNOSIS — C50811 Malignant neoplasm of overlapping sites of right female breast: Secondary | ICD-10-CM | POA: Diagnosis not present

## 2017-07-09 ENCOUNTER — Ambulatory Visit
Admission: RE | Admit: 2017-07-09 | Discharge: 2017-07-09 | Disposition: A | Discharge status: Home / Self Care | Payer: Medicare Other | Source: Ambulatory Visit | Attending: Radiation Oncology | Admitting: Radiation Oncology

## 2017-07-09 DIAGNOSIS — C50811 Malignant neoplasm of overlapping sites of right female breast: Secondary | ICD-10-CM | POA: Diagnosis not present

## 2017-07-10 ENCOUNTER — Ambulatory Visit
Admission: RE | Admit: 2017-07-10 | Discharge: 2017-07-10 | Disposition: A | Discharge status: Home / Self Care | Payer: Medicare Other | Source: Ambulatory Visit | Attending: Radiation Oncology | Admitting: Radiation Oncology

## 2017-07-10 ENCOUNTER — Ambulatory Visit: Payer: Medicare Other

## 2017-07-10 DIAGNOSIS — Z170 Estrogen receptor positive status [ER+]: Principal | ICD-10-CM

## 2017-07-10 DIAGNOSIS — C50811 Malignant neoplasm of overlapping sites of right female breast: Secondary | ICD-10-CM

## 2017-07-11 ENCOUNTER — Ambulatory Visit: Payer: Medicare Other

## 2017-07-11 ENCOUNTER — Other Ambulatory Visit: Payer: Self-pay | Admitting: Oncology

## 2017-07-11 DIAGNOSIS — Z170 Estrogen receptor positive status [ER+]: Principal | ICD-10-CM

## 2017-07-11 DIAGNOSIS — C50811 Malignant neoplasm of overlapping sites of right female breast: Secondary | ICD-10-CM

## 2017-07-11 MED ORDER — RADIAPLEXRX EX GEL
Freq: Once | CUTANEOUS | Status: AC
  Administered 2017-07-11: 09:00:00 via TOPICAL

## 2017-07-14 ENCOUNTER — Ambulatory Visit: Payer: Medicare Other

## 2017-07-15 ENCOUNTER — Encounter: Payer: Self-pay | Admitting: Radiation Oncology

## 2017-07-15 ENCOUNTER — Ambulatory Visit: Payer: Medicare Other

## 2017-07-15 NOTE — Progress Notes (Signed)
  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  During treatment, the patient experienced brisk erythema in the right chest wall with dry desquamation. She did not undergo a previously planned boost treatment due to this skin reaction.  Total dose therefore was 50.4 gray.  Plan: The patient has completed radiation treatment. The patient will return to radiation oncology clinic for routine followup in one month. I advised them to call or return sooner if they have any questions or concerns related to their recovery or treatment.  ------------------------------------------------  Jodelle Gross, MD, PhD  This document serves as a record of services personally performed by Kyung Rudd, MD. It was created on his behalf by Bethann Humble, a trained medical scribe. The creation of this record is based on the scribe's personal observations and the provider's statements to them. This document has been checked and approved by the attending provider.

## 2017-07-16 ENCOUNTER — Ambulatory Visit: Payer: Medicare Other

## 2017-07-17 ENCOUNTER — Ambulatory Visit: Payer: Medicare Other

## 2017-07-18 ENCOUNTER — Telehealth: Payer: Self-pay | Admitting: Internal Medicine

## 2017-07-18 NOTE — Telephone Encounter (Signed)
Copied from [place] [phone]. Topic: Quick Communication - See Telephone Encounter >> Jul 18, 2017  2:22 PM Hewitt Shorts wrote: CRM for notification. See Telephone encounter for:  Jasmine Hood from [facility] is calling to get verbal orders to continue PT 2 x for 5 weeks  Best number [phone] 07/18/17.

## 2017-07-21 ENCOUNTER — Telehealth: Payer: Self-pay | Admitting: Internal Medicine

## 2017-07-21 NOTE — Telephone Encounter (Signed)
Darrick Meigs PT with [facility] [phone] called in to request an extention to continue working with pt for PT twice a week for 5 more weeks.

## 2017-07-21 NOTE — Telephone Encounter (Signed)
Copied from [place]. Topic: Quick Communication - See Telephone Encounter >> Jul 21, 2017  9:49 AM Margot Ables wrote: CRM for notification. See Telephone encounter for: 07/21/17.  Jasmine Hood is requesting extension on [facility] orders for 2x week for 2 more weeks to monitor radiation burn on R chest.  OK to leave detailed msg on confidential voicemail

## 2017-07-22 NOTE — Telephone Encounter (Signed)
Dr k

## 2017-07-23 NOTE — Telephone Encounter (Signed)
Spoke with Darrick Meigs and gave her verbal orders for PT per Dr.Todd.

## 2017-07-23 NOTE — Telephone Encounter (Signed)
Spoke with Tammy from [facility] giving okay orders for speech therapy per Dr.Todd.

## 2017-07-25 ENCOUNTER — Telehealth: Payer: Self-pay | Admitting: *Deleted

## 2017-07-25 NOTE — Telephone Encounter (Signed)
Okay for verbal orders. 
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
## 2017-07-27 NOTE — Telephone Encounter (Signed)
Yes okay for verbal orders

## 2017-07-28 NOTE — Telephone Encounter (Signed)
Verbal orders called as directed.

## 2017-07-30 ENCOUNTER — Ambulatory Visit: Payer: Medicare Other | Admitting: Internal Medicine

## 2017-07-30 DIAGNOSIS — Z0289 Encounter for other administrative examinations: Secondary | ICD-10-CM

## 2017-08-11 ENCOUNTER — Telehealth: Payer: Self-pay | Admitting: *Deleted

## 2017-08-11 ENCOUNTER — Other Ambulatory Visit: Payer: Self-pay

## 2017-08-11 ENCOUNTER — Encounter: Payer: Self-pay | Admitting: Radiation Oncology

## 2017-08-11 ENCOUNTER — Ambulatory Visit
Admission: RE | Admit: 2017-08-11 | Discharge: 2017-08-11 | Disposition: A | Discharge status: Home / Self Care | Payer: Medicare Other | Source: Ambulatory Visit | Attending: Radiation Oncology | Admitting: Radiation Oncology

## 2017-08-11 VITALS — BP 130/85 | HR 87 | Temp 97.7°F | Resp 20 | Ht 59.0 in | Wt 204.6 lb

## 2017-08-11 DIAGNOSIS — D0512 Intraductal carcinoma in situ of left breast: Secondary | ICD-10-CM | POA: Diagnosis not present

## 2017-08-11 DIAGNOSIS — C50811 Malignant neoplasm of overlapping sites of right female breast: Secondary | ICD-10-CM

## 2017-08-11 DIAGNOSIS — Z170 Estrogen receptor positive status [ER+]: Secondary | ICD-10-CM | POA: Insufficient documentation

## 2017-08-11 DIAGNOSIS — D0511 Intraductal carcinoma in situ of right breast: Secondary | ICD-10-CM | POA: Insufficient documentation

## 2017-08-11 NOTE — Addendum Note (Signed)
Encounter addended by: Malena Edman, RN on: 08/11/2017 10:40 AM  Actions taken: Charge Capture section accepted

## 2017-08-11 NOTE — Telephone Encounter (Signed)
CALLED DR. MAGRINAT'S OFFICE TO ARRANGE FU AND FLUSH FOR THIS PATIENT, LVM FOR A RETURN CALL

## 2017-08-11 NOTE — Progress Notes (Signed)
  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 Right chest wall/ 50.4 Gy in 28 fractions 2. L-spine/ 30 Gy in 10 fractions   Narrative:  The patient returns today for routine follow-up. During treatment she did very well with radiotherapy and did not have significant desquamation.                             On review of systems, the patient states she's doing well. She reports she has had some dryness of her skin. She also wants to know when her PAC needs to be flushed again and what the next steps are with Dr. [name]. She reports improvement in her low back pain. No other complaints are noted.   ALLERGIES:  is allergic to bee venom.  Meds: Current Outpatient Medications  Medication Sig Dispense Refill  . HYDROcodone-acetaminophen (NORCO/VICODIN) 5-325 MG tablet Take 1-2 tablets by mouth every 6 (six) hours as needed for moderate pain. 90 tablet 0  . valACYclovir (VALTREX) 500 MG tablet Take 1 tablet (500 mg total) by mouth daily. 30 tablet 6  . phenazopyridine (PYRIDIUM) 200 MG tablet Take 1 tablet (200 mg total) by mouth 3 (three) times daily. (Patient not taking: Reported on 08/11/2017) 6 tablet 0   No current facility-administered medications for this encounter.     Physical Findings:  height is 4\' 11"  (1.499 m) and weight is 204 lb 9.6 oz (92.8 kg). Her oral temperature is 97.7 F (36.5 C). Her blood pressure is 130/85 and her pulse is 87. Her respiration is 20 and oxygen saturation is 96%.  Pain Assessment Pain Score: 7  Pain Loc: Back/10 In general this is a well appearing caucasian female in  no acute distress. She's alert and oriented x4 and appropriate throughout the examination. Cardiopulmonary assessment is negative for acute distress and she exhibits normal effort. The right chest wall and regional node sites were examined and reveal mild hyperpigmentation, as well as hyperpigmentation along the low spine along the midline. No desquamation is noted.   Lab Findings: Lab Results  Component Value Date   WBC 7.1 05/10/2017   HGB 12.4 05/10/2017   HCT 38.4 05/10/2017   MCV 107.0 (H) 05/10/2017   PLT 216 05/10/2017     Radiographic Findings: No results found.  Impression/Plan: 1. 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 The patient has been doing well since completion of radiotherapy. We discussed that we would be happy to continue to follow her as needed, but she will also continue to follow up with Dr. [name] in medical oncology. She was counseled on skin care as well as measures to avoid sun exposure to this area. We will coordinate an appointment with Dr. [name] in the next week or so, and coordinate a port flush to follow this appointment if he recommends that she keep this in situ.      Carola Rhine, PAC

## 2017-08-18 ENCOUNTER — Ambulatory Visit: Payer: Self-pay | Admitting: Internal Medicine

## 2017-08-18 DIAGNOSIS — Z0289 Encounter for other administrative examinations: Secondary | ICD-10-CM

## 2017-08-19 ENCOUNTER — Ambulatory Visit: Payer: Self-pay | Admitting: Adult Health

## 2017-08-19 ENCOUNTER — Other Ambulatory Visit: Payer: Self-pay

## 2017-08-22 ENCOUNTER — Other Ambulatory Visit: Payer: Self-pay

## 2017-08-22 ENCOUNTER — Ambulatory Visit: Payer: Self-pay | Admitting: Adult Health

## 2017-08-27 ENCOUNTER — Other Ambulatory Visit: Payer: Self-pay

## 2017-08-27 ENCOUNTER — Encounter: Payer: Self-pay | Admitting: Adult Health

## 2017-08-27 NOTE — Progress Notes (Signed)
NO SHOW

## 2017-09-19 ENCOUNTER — Telehealth: Payer: Self-pay

## 2017-09-19 ENCOUNTER — Telehealth: Payer: Self-pay | Admitting: *Deleted

## 2017-09-19 NOTE — Telephone Encounter (Signed)
Patient wanted to come in for a flush, how ever it was to late. Offered to schedule for 9:45 on Monday. She declined (called triage RN). To inform her of the patient request. (RN will f/u on),. Per 5/3 phone message return

## 2017-09-19 NOTE — Telephone Encounter (Signed)
Copied from [place] [phone]. Topic: Quick Communication - Rx Refill/Question >> Sep 19, 2017  1:59 PM  [place] wrote: Medication      HYDROcodone-acetaminophen NORCO/VICODIN 5-325 MG tablet                valACYclovir VALTREX 500 MG tablet   Preferred Pharmacy  [facility]  Agent: Please be advised that RX refills may take up to 3 business days. We ask that you follow-up with your pharmacy.

## 2017-09-22 ENCOUNTER — Ambulatory Visit (INDEPENDENT_AMBULATORY_CARE_PROVIDER_SITE_OTHER): Payer: Medicare Other | Admitting: Internal Medicine

## 2017-09-22 ENCOUNTER — Encounter: Payer: Self-pay | Admitting: Internal Medicine

## 2017-09-22 VITALS — BP 110/78 | HR 76 | Temp 98.2°F | Wt 201.0 lb

## 2017-09-22 DIAGNOSIS — R7989 Other specified abnormal findings of blood chemistry: Secondary | ICD-10-CM

## 2017-09-22 DIAGNOSIS — C7951 Secondary malignant neoplasm of bone: Secondary | ICD-10-CM

## 2017-09-22 DIAGNOSIS — E039 Hypothyroidism, unspecified: Secondary | ICD-10-CM

## 2017-09-22 DIAGNOSIS — C50811 Malignant neoplasm of overlapping sites of right female breast: Secondary | ICD-10-CM

## 2017-09-22 DIAGNOSIS — I10 Essential (primary) hypertension: Secondary | ICD-10-CM | POA: Diagnosis not present

## 2017-09-22 DIAGNOSIS — Z170 Estrogen receptor positive status [ER+]: Secondary | ICD-10-CM

## 2017-09-22 LAB — COMPREHENSIVE METABOLIC PANEL
ALT: 33 U/L (ref 0–35)
AST: 49 U/L — AB (ref 0–37)
Albumin: 3.5 g/dL (ref 3.5–5.2)
Alkaline Phosphatase: 162 U/L — ABNORMAL HIGH (ref 39–117)
BUN: 13 mg/dL (ref 6–23)
CHLORIDE: 103 meq/L (ref 96–112)
CO2: 27 mEq/L (ref 19–32)
CREATININE: 0.62 mg/dL (ref 0.40–1.20)
Calcium: 9.1 mg/dL (ref 8.4–10.5)
GFR: 100.08 mL/min (ref >60.00)
GLUCOSE: 126 mg/dL — AB (ref 70–99)
POTASSIUM: 3.7 meq/L (ref 3.5–5.1)
SODIUM: 140 meq/L (ref 135–145)
TOTAL PROTEIN: 6.5 g/dL (ref 6.0–8.3)
Total Bilirubin: 0.5 mg/dL (ref 0.2–1.2)

## 2017-09-22 LAB — CBC WITH DIFFERENTIAL/PLATELET
BASOS PCT: 0.5 % (ref 0.0–3.0)
Basophils Absolute: 0 10*3/uL (ref 0.0–0.1)
EOS PCT: 2 % (ref 0.0–5.0)
Eosinophils Absolute: 0.1 10*3/uL (ref 0.0–0.7)
HCT: 35.1 % — ABNORMAL LOW (ref 36.0–46.0)
Hemoglobin: 11.9 g/dL — ABNORMAL LOW (ref 12.0–15.0)
LYMPHS ABS: 0.9 10*3/uL (ref 0.7–4.0)
Lymphocytes Relative: 17.9 % (ref 12.0–46.0)
MCHC: 33.8 g/dL (ref 30.0–36.0)
MCV: 109 fl — ABNORMAL HIGH (ref 78.0–100.0)
MONO ABS: 0.4 10*3/uL (ref 0.1–1.0)
Monocytes Relative: 7.2 % (ref 3.0–12.0)
NEUTROS PCT: 72.4 % (ref 43.0–77.0)
Neutro Abs: 3.6 10*3/uL (ref 1.4–7.7)
PLATELETS: 182 10*3/uL (ref 150.0–400.0)
RBC: 3.22 Mil/uL — ABNORMAL LOW (ref 3.87–5.11)
RDW: 15.3 % (ref 11.5–15.5)
WBC: 4.9 10*3/uL (ref 4.0–10.5)

## 2017-09-22 LAB — TSH: TSH: 6.82 u[IU]/mL — AB (ref 0.35–4.50)

## 2017-09-22 NOTE — Progress Notes (Signed)
Subjective:    Patient ID: Jasmine Hood, female    DOB: 08/25/1945, 72 y.o.   MRN: [id number]  HPI 72 year old patient who has a history of metastatic breast cancer.  She is followed by oncology and has recently been discharged by radiation oncology.  He is accompanied by her daughter today with concerns about possible dementia. For the past few weeks she has had some occasional confusion.  Her daughter states that she occasionally forgets that her mother is deceased she has become slightly more forgetful   MMSE performed today with a score of 25 of 30.  The patient was unable to name the date or spell the word world backwards.  She was able to recall 3 unrelated items.    Past Medical History:  Diagnosis Date  . Breast cancer ([place]) 10/07/2016   right breast, spine  . Brown recluse spider bite    right leg  . COLONIC POLYPS, HX OF 01/05/2009   Qualifier: Diagnosis of  By: [name]  MD, [name]   . Complication of anesthesia    slow to awaken after wisdom  teeth extraction age 44  . Constipation   . Genetic testing 11/27/2016   Ms. Gerety underwent genetic counseling and testing for hereditary cancer syndromes on 11/19/2016. Her results were negative for mutations in all 46 genes analyzed by [facility]'s 46-gene Common Hereditary Cancers Panel. Genes analyzed include: APC, ATM, AXIN2, BARD1, BMPR1A, BRCA1, BRCA2, BRIP1, CDH1, CDKN2A, CHEK2, CTNNA1, DICER1, EPCAM, GREM1, HOXB13, KIT, MEN1, MLH1, MSH2, MSH3, MSH6, MUTYH, NBN,  . HYPOTHYROIDISM 02/01/2010   Qualifier: Diagnosis of  By: [name]  MD, [name]   . Morbid obesity ([place]) 01/05/2009   Qualifier: Diagnosis of  By: [name]  MD, [name]   . Pneumonia 1987  . Sleep apnea    no cpap used   . Stroke [facility])    found on CT scan     Social History   Socioeconomic History  . Marital status: Widowed    Spouse name: Not on file  . Number of children: 5  . Years of education: Not on file  . Highest education level: Not on file   Occupational History  . Occupation: retired  Scientific laboratory technician  . Financial resource strain: Not on file  . Food insecurity:    Worry: Not on file    Inability: Not on file  . Transportation needs:    Medical: Not on file    Non-medical: Not on file  Tobacco Use  . Smoking status: Never Smoker  . Smokeless tobacco: Never Used  Substance and Sexual Activity  . Alcohol use: No  . Drug use: No  . Sexual activity: Not Currently  Lifestyle  . Physical activity:    Days per week: Not on file    Minutes per session: Not on file  . Stress: Not on file  Relationships  . Social connections:    Talks on phone: Not on file    Gets together: Not on file    Attends religious service: Not on file    Active member of club or organization: Not on file    Attends meetings of clubs or organizations: Not on file    Relationship status: Not on file  . Intimate partner violence:    Fear of current or ex partner: Not on file    Emotionally abused: Not on file    Physically abused: Not on file    Forced sexual activity: Not on file  Other Topics Concern  . Not on file  Social History Narrative  . Not on file    Past Surgical History:  Procedure Laterality Date  . BILATERAL TOTAL MASTECTOMY WITH AXILLARY LYMPH NODE DISSECTION Bilateral 04/04/2017   Procedure: RIGHT MASTECTOMY WITH TARGETED RIGHT AXILLARY NODE DISSECTION AND LEFT PROPHYLATIC MASTECTOMY ERAS PATHWAY;  Surgeon: Alphonsa Overall, MD;  Location: [facility];  Service: General;  Laterality: Bilateral;  PEC BLOCK  . BREAST BIOPSY Right 10/07/2016   invasive mammary carcinoma  . BREAST BIOPSY Left 10/08/2016   breast fibroadenoma no malignancy  . CATARACT EXTRACTION Bilateral   . COLONOSCOPY W/ BIOPSIES AND POLYPECTOMY  2012  . COLONOSCOPY WITH PROPOFOL N/A 12/22/2015   Procedure: COLONOSCOPY WITH PROPOFOL;  Surgeon: [name], MD;  Location: [facility];  Service: Endoscopy;  Laterality: N/A;  . INCISION AND DRAINAGE Right 1998    surgery for spider bite on RLE; "[name]"  . MASTECTOMY Left 04/04/2017   [name]  . MASTECTOMY WITH AXILLARY LYMPH NODE DISSECTION Right 04/04/2017  . MOLE REMOVAL  10/29/2016   Procedure: MOLE REMOVAL LEFT CHEST;  Surgeon: Alphonsa Overall, MD;  Location: [facility];  Service: General;;  . PORTACATH PLACEMENT N/A 10/29/2016   Procedure: INSERTION PORT-A-CATH;  Surgeon: Alphonsa Overall, MD;  Location: [facility];  Service: General;  Laterality: N/A;  . TUBAL LIGATION    . WISDOM TOOTH EXTRACTION  age 17    Family History  Problem Relation Age of Onset  . Breast cancer Mother 69       d.52 from metastatic disease  . Throat cancer Father        d.65 history of smoking  . Rheum arthritis Sister        3 sisters pos. for osteo and RA  . Diabetes Maternal Aunt   . Cancer Maternal Grandfather        mouth cancer-chewed tobacco  . Breast cancer Cousin 28  . Breast cancer Daughter 57  . Stomach cancer Sister 20    Allergies  Allergen Reactions  . Bee Venom Anaphylaxis    Current Outpatient Medications on File Prior to Visit  Medication Sig Dispense Refill  . HYDROcodone-acetaminophen (NORCO/VICODIN) 5-325 MG tablet Take 1-2 tablets by mouth every 6 (six) hours as needed for moderate pain. 90 tablet 0  . phenazopyridine (PYRIDIUM) 200 MG tablet Take 1 tablet (200 mg total) by mouth 3 (three) times daily. 6 tablet 0  . valACYclovir (VALTREX) 500 MG tablet Take 1 tablet (500 mg total) by mouth daily. 30 tablet 6   No current facility-administered medications on file prior to visit.     BP 110/78   Pulse 76   Temp 98.2 F (36.8 C) (Oral)   Wt 201 lb (91.2 kg)   SpO2 96%   BMI 40.60 kg/m        Review of Systems  Constitutional: Negative.   HENT: Negative for congestion, dental problem, hearing loss, rhinorrhea, sinus pressure, sore throat and tinnitus.   Eyes: Negative for pain, discharge and visual disturbance.  Respiratory: Negative for cough and shortness of breath.     Cardiovascular: Negative for chest pain, palpitations and leg swelling.  Gastrointestinal: Negative for abdominal distention, abdominal pain, blood in stool, constipation, diarrhea, nausea and vomiting.  Genitourinary: Negative for difficulty urinating, dysuria, flank pain, frequency, hematuria, pelvic pain, urgency, vaginal bleeding, vaginal discharge and vaginal pain.  Musculoskeletal: Negative for arthralgias, gait problem and joint swelling.  Skin: Negative for rash.  Neurological:  Negative for dizziness, syncope, speech difficulty, weakness, numbness and headaches.  Hematological: Negative for adenopathy.  Psychiatric/Behavioral: Positive for behavioral problems and confusion. Negative for agitation and dysphoric mood. The patient is not nervous/anxious.        Objective:   Physical Exam  Constitutional: She is oriented to person, place, and time. She appears well-developed and well-nourished.  HENT:  Head: Normocephalic.  Right Ear: External ear normal.  Left Ear: External ear normal.  Mouth/Throat: Oropharynx is clear and moist.  Eyes: Pupils are equal, round, and reactive to light. Conjunctivae and EOM are normal.  Neck: Normal range of motion. Neck supple. No thyromegaly present.  Cardiovascular: Normal rate, regular rhythm, normal heart sounds and intact distal pulses.  Pulmonary/Chest: Effort normal and breath sounds normal.  Abdominal: Soft. Bowel sounds are normal. She exhibits no mass. There is no tenderness.  Musculoskeletal: Normal range of motion.  Lymphadenopathy:    She has no cervical adenopathy.  Neurological: She is alert and oriented to person, place, and time.  No focal findings No drift Normal finger-to-nose testing  MMSE 25/30  Skin: Skin is warm and dry. No rash noted.  Psychiatric: She has a normal mood and affect. Her behavior is normal.          Assessment & Plan:   Metastatic breast cancer 3-week history of worsening  confusion Hypothyroidism Essential hypertension stable  Will check updated lab Follow-up oncology If laboratory studies normal, consider brain MRI  Nyoka Cowden

## 2017-09-22 NOTE — Patient Instructions (Signed)
Follow-up with oncology Return here in 3 months for follow-up.   Report any new or worsening symptoms

## 2017-09-23 ENCOUNTER — Other Ambulatory Visit: Payer: Self-pay

## 2017-09-24 NOTE — Addendum Note (Signed)
Addended by: Gwenyth Ober R on: 09/24/2017 11:57 AM   Modules accepted: Orders

## 2017-09-24 NOTE — Progress Notes (Signed)
[place]  [phone] [phone]) [phone]     ID: Jasmine Hood DOB: 11-29-1945  MR#: [id number]  ZTI#:[id number]  Patient Care Team: Marletta Lor, MD as PCP - General Magrinat, Virgie Dad, MD as Consulting Physician (Oncology) Alphonsa Overall, MD as Consulting Physician (General Surgery) Kyung Rudd, MD as Consulting Physician (Radiation Oncology) Irene Limbo, MD as Consulting Physician (Plastic Surgery) Rockwell Germany, RN as Registered Nurse Scot Dock, NP OTHER MD:  CHIEF COMPLAINT: Locally advanced estrogen receptor positive breast cancer  CURRENT TREATMENT: Adjuvant anti estrogen therapy  INTERVAL HISTORY: Jasmine Hood is here today for follow up she is here with her daughter Jasmine Hood.  She is doing well today.  She says that she is feeling much better and stronger since completing her chemotherapy.    REVIEW OF SYSTEMS: Elvin has some constipation she wants help with managing.  She says she has a bowel movement every day, but it is very hard for her to pass.  She also reports dysuria.  Maleigha has intermittent lower back pain that she takes Vicodin for at night.  This helps her pain and allows her to sleep well.  She denies any other issues and has been doing well since surgery and radiation. Otherwise a detailed ROS was non contributory.    BREAST CANCER HISTORY: From the original intake note  Jasmine Hood herself noted a change in her right breast sometime in September 2017. She did not immediately bring it to medical attention. When she noted some significant changes in her right nipple she saw [name] and was set up for bilateral diagnostic mammography with tomography and [facility] breast [facility] Oct [facility] 2016. This found the breast density to be category B. In the upper and lower outer quadrants of the right breast there was a mass measuring at least 10 cm. There were also groups of heterogeneous calcifications  measuring 3 cm and a separate group 0.4 cm. On physical exam there was a palpable firm mass measuring approximately 10 cm involving the upper outer and lower outer quadrant of the right breast, with skin reaction and nipple retraction. There was no palpable right axillary adenopathy.  Right breast ultrasonography confirmed a large hypoechoic mass with irregular margins extending to the skin surface. The right axilla showed 2 lymph nodes which appeared abnormal.  In the left breast there were some indeterminate retroareolar calcifications which were felt to warrant biopsy. This was performed 10/08/2016 and showed a fibroadenoma ([id number]).  Biopsy of the right breast mass at the 7:00 and 10:00 positions as well as one of the suspicious lymph nodes all showed invasive ductal carcinoma, E-cadherin positive. Separate prognostic profiles from the 2 breast masses were sent. Estrogen receptor was positive at 95-70%, progesterone receptor was positive at 15-85%, all with strong staining intensity, the MIB-1 was 15-20%, and HER-2 was nonamplified, the signals ratio being 1.28-1.72 and the number per cell 1.85-3.95.  The patient's subsequent history is as detailed below   PAST MEDICAL HISTORY: Past Medical History:  Diagnosis Date  . Breast cancer ([place]) 10/07/2016   right breast, spine  . Brown recluse spider bite    right leg  . COLONIC POLYPS, HX OF 01/05/2009   Qualifier: Diagnosis of  By: [name]  MD, [name]   . Complication of anesthesia    slow to awaken after wisdom  teeth extraction age 72  . Constipation   . Genetic testing 11/27/2016   [name] underwent genetic counseling  and testing for hereditary cancer syndromes on 11/19/2016. Her results were negative for mutations in all 46 genes analyzed by [facility]'s 46-gene Common Hereditary Cancers Panel. Genes analyzed include: APC, ATM, AXIN2, BARD1, BMPR1A, BRCA1, BRCA2, BRIP1, CDH1, CDKN2A, CHEK2, CTNNA1, DICER1, EPCAM, GREM1, HOXB13,  KIT, MEN1, MLH1, MSH2, MSH3, MSH6, MUTYH, NBN,  . HYPOTHYROIDISM 02/01/2010   Qualifier: Diagnosis of  By: [name]  MD, [name]   . Morbid obesity ([place]) 01/05/2009   Qualifier: Diagnosis of  By: [name]  MD, [name]   . Pneumonia 1987  . Sleep apnea    no cpap used   . Stroke [facility])    found on CT scan    PAST SURGICAL HISTORY: Past Surgical History:  Procedure Laterality Date  . BILATERAL TOTAL MASTECTOMY WITH AXILLARY LYMPH NODE DISSECTION Bilateral 04/04/2017   Procedure: RIGHT MASTECTOMY WITH TARGETED RIGHT AXILLARY NODE DISSECTION AND LEFT PROPHYLATIC MASTECTOMY ERAS PATHWAY;  Surgeon: Alphonsa Overall, MD;  Location: [facility] [place];  Service: General;  Laterality: Bilateral;  PEC BLOCK  . BREAST BIOPSY Right 10/07/2016   invasive mammary carcinoma  . BREAST BIOPSY Left 10/08/2016   breast fibroadenoma no malignancy  . CATARACT EXTRACTION Bilateral   . COLONOSCOPY W/ BIOPSIES AND POLYPECTOMY  2012  . COLONOSCOPY WITH PROPOFOL N/A 12/22/2015   Procedure: COLONOSCOPY WITH PROPOFOL;  Surgeon: [name], MD;  Location: [facility];  Service: Endoscopy;  Laterality: N/A;  . INCISION AND DRAINAGE Right 1998   surgery for spider bite on RLE; "[name]"  . MASTECTOMY Left 04/04/2017   [name]  . MASTECTOMY WITH AXILLARY LYMPH NODE DISSECTION Right 04/04/2017  . MOLE REMOVAL  10/29/2016   Procedure: MOLE REMOVAL LEFT CHEST;  Surgeon: Alphonsa Overall, MD;  Location: [facility];  Service: General;;  . PORTACATH PLACEMENT N/A 10/29/2016   Procedure: INSERTION PORT-A-CATH;  Surgeon: Alphonsa Overall, MD;  Location[facility];  Service: General;  Laterality: N/A;  . TUBAL LIGATION    . WISDOM TOOTH EXTRACTION  age 72    FAMILY HISTORY Family History  Problem Relation Age of Onset  . Breast cancer Mother 109       d.52 from metastatic disease  . Throat cancer Father        d.65 history of smoking  . Rheum arthritis Sister        3 sisters pos. for osteo and RA  . Diabetes Maternal  Aunt   . Cancer Maternal Grandfather        mouth cancer-chewed tobacco  . Breast cancer Cousin 43  . Breast cancer Daughter 44  . Stomach cancer Sister 25  The patient's father died at the age of 75 from laryngeal cancer in the setting of tobacco abuse. The patient's mother died at the age of 62 from metastatic breast cancer which had been diagnosed in her early 33s. The patient has 2 half-sisters and one half-brother. One of her sisters has "stomach" cancer. There is also a cousin on the maternal side with breast cancer diagnosed at age 74. No family member has been genetically tested yet  GYNECOLOGIC HISTORY:  No LMP recorded. Patient is postmenopausal. Menarche age 3, first live birth age 42, she is [place] P5. She is status post bilateral tubal ligation. She stopped having periods in 1985. She tells me she did not use hormone replacement. However a note from her bone density scan December 2001 states "this patient began hormone replacement therapy 3 months ago"  SOCIAL HISTORY:  She is originally  from [place]. 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 She has survived 2 husband's period at home currently is just she and her daughter Sahirah Rudell. She tells me this daughter had mild brain damage at birth and is not working, and cannot drive because of a history of seizures. However she is the one she is planning to name is her healthcare power of attorney. The patient has 13 grandchildren and 4 great-grandchildren. She attends a [employer]: At the 10/21/2016 visit the patient was given the appropriate documents to complete and notarize at her discretion   HEALTH MAINTENANCE: Social History   Tobacco Use  . Smoking status: Never Smoker  . Smokeless tobacco: Never Used  Substance Use Topics  . Alcohol use: No  . Drug use: No     Colonoscopy: 12/22/2015/Nandigam  PAP:  Bone density: December 2001 was normal   Allergies  Allergen  Reactions  . Bee Venom Anaphylaxis    Current Outpatient Medications  Medication Sig Dispense Refill  . anastrozole (ARIMIDEX) 1 MG tablet Take 1 tablet (1 mg total) by mouth daily. 90 tablet 3  . HYDROcodone-acetaminophen (NORCO/VICODIN) 5-325 MG tablet Take 1-2 tablets by mouth every 6 (six) hours as needed for moderate pain. 90 tablet 0  . valACYclovir (VALTREX) 500 MG tablet Take 1 tablet (500 mg total) by mouth daily. 30 tablet 6   No current facility-administered medications for this visit.     OBJECTIVE:   Vitals:   09/25/17 0827  BP: (!) 143/61  Pulse: 83  Resp: 17  Temp: 98.1 F (36.7 C)  SpO2: 100%     Body mass index is 41.24 kg/m.    ECOG FS:1 - Symptomatic but completely ambulatory  GENERAL: Patient is a chronically ill appearing older obese female in no acute distress HEENT:  Sclerae anicteric.  Oropharynx clear and moist. No ulcerations or evidence of oropharyngeal candidiasis. Neck is supple.  NODES:  No cervical, supraclavicular, or axillary lymphadenopathy palpated.  BREAST EXAM:  S/p bilateral mastectomies, no nodularity, no sign of recurrence. LUNGS:  Clear to auscultation bilaterally.  No wheezes or rhonchi. HEART:  Regular rate and rhythm. No murmur appreciated. ABDOMEN:  Soft, nontender.  Positive, normoactive bowel sounds. No organomegaly palpated. MSK:  No focal spinal tenderness to palpation. Full range of motion bilaterally in the upper extremities. EXTREMITIES:  No peripheral edema.   SKIN:  Clear with no obvious rashes or skin changes. No nail dyscrasia. NEURO:  Nonfocal. Well oriented.  Appropriate affect.      LAB RESULTS:  CMP     Component Value Date/Time   NA 138 09/25/2017 0731   NA 140 03/21/2017 0918   K 3.6 09/25/2017 0731   K 3.7 03/21/2017 0918   CL 106 09/25/2017 0731   CO2 26 09/25/2017 0731   CO2 23 03/21/2017 0918   GLUCOSE 134 09/25/2017 0731   GLUCOSE 120 03/21/2017 0918   BUN 12 09/25/2017 0731   BUN 6.6 (L)  03/21/2017 0918   CREATININE 0.67 09/25/2017 0731   CREATININE 0.6 03/21/2017 0918   CALCIUM 9.0 09/25/2017 0731   CALCIUM 8.6 03/21/2017 0918   PROT 6.5 09/25/2017 0731   PROT 6.3 (L) 03/21/2017 0918   ALBUMIN 2.9 (L) 09/25/2017 0731   ALBUMIN 2.4 (L) 03/21/2017 0918   AST 56 (H) 09/25/2017 0731   AST 53 (H) 03/21/2017 0918   ALT 41 09/25/2017 0731   ALT 29 03/21/2017 0918   ALKPHOS  178 (H) 09/25/2017 0731   ALKPHOS 123 03/21/2017 0918   BILITOT 0.4 09/25/2017 0731   BILITOT 0.55 03/21/2017 0918   GFRNONAA >60 09/25/2017 0731   GFRAA >60 09/25/2017 0731    No results found for: Ronnald Ramp, A1GS, A2GS, BETS, BETA2SER, GAMS, MSPIKE, SPEI  No results found for: Nils Pyle, [facility]  Lab Results  Component Value Date   WBC 3.5 (L) 09/25/2017   NEUTROABS 2.2 09/25/2017   HGB 10.2 (L) 09/25/2017   HCT 31.1 (L) 09/25/2017   MCV 109.9 (H) 09/25/2017   PLT 153 09/25/2017      Chemistry      Component Value Date/Time   NA 138 09/25/2017 0731   NA 140 03/21/2017 0918   K 3.6 09/25/2017 0731   K 3.7 03/21/2017 0918   CL 106 09/25/2017 0731   CO2 26 09/25/2017 0731   CO2 23 03/21/2017 0918   BUN 12 09/25/2017 0731   BUN 6.6 (L) 03/21/2017 0918   CREATININE 0.67 09/25/2017 0731   CREATININE 0.6 03/21/2017 0918      Component Value Date/Time   CALCIUM 9.0 09/25/2017 0731   CALCIUM 8.6 03/21/2017 0918   ALKPHOS 178 (H) 09/25/2017 0731   ALKPHOS 123 03/21/2017 0918   AST 56 (H) 09/25/2017 0731   AST 53 (H) 03/21/2017 0918   ALT 41 09/25/2017 0731   ALT 29 03/21/2017 0918   BILITOT 0.4 09/25/2017 0731   BILITOT 0.55 03/21/2017 0918       No results found for: LABCA2  No components found for: [id number]  No results for input(s): INR in the last 168 hours.  Urinalysis    Component Value Date/Time   COLORURINE YELLOW 09/25/2017 1005   APPEARANCEUR HAZY (A) 09/25/2017 1005   LABSPEC 1.020 09/25/2017 1005   PHURINE 5.0 09/25/2017  1005   GLUCOSEU NEGATIVE 09/25/2017 1005   GLUCOSEU NEGATIVE 01/25/2010 0858   HGBUR NEGATIVE 09/25/2017 1005   HGBUR 1+ 12/30/2008 0838   BILIRUBINUR NEGATIVE 09/25/2017 1005   BILIRUBINUR Neg 04/28/2015 1122   KETONESUR NEGATIVE 09/25/2017 1005   PROTEINUR NEGATIVE 09/25/2017 1005   UROBILINOGEN 0.2 04/28/2015 1122   UROBILINOGEN 0.2 01/25/2010 0858   NITRITE NEGATIVE 09/25/2017 1005   LEUKOCYTESUR LARGE (A) 09/25/2017 1005     STUDIES: No results found.  ELIGIBLE FOR AVAILABLE RESEARCH PROTOCOL: no  ASSESSMENT: 72 y.o. [place], [place] woman status post right breast overlapping sites biopsy 2 and lymph node biopsy 10/07/2016 all positive for an invasive ductal carcinoma, grade 2, estrogen and progesterone receptor positive, HER-2 nonamplified, with an MIB-1 between 15 and 20%.--This is clinical stage IIIB  (a) breast MRI suggests left-sided disease, biopsy 11/18/2016 shows atypical ductal hyperplasia  (1) genetics testing 11/19/2016--Genetic counseling and testing for hereditary cancer syndromes performed on 11/19/2016. Results are negative for pathogenic mutations in 46 genes analyzed by [facility]'s Common Hereditary Cancers Panel. Results are dated 11/26/2016. Genes tested: APC, ATM, AXIN2, BARD1, BMPR1A, BRCA1, BRCA2, BRIP1, CDH1, CDKN2A, CHEK2, CTNNA1, DICER1, EPCAM, GREM1, HOXB13, KIT, MEN1, MLH1, MSH2, MSH3, MSH6, MUTYH, NBN, NF1, NTHL1, PALB2, PDGFRA, PMS2, POLD1, POLE, PTEN, RAD50, RAD51C, RAD51D, SDHA, SDHB, SDHC, SDHD, SMAD4, SMARCA4, STK11, TP53, TSC1, TSC2, and VHL.  (a) A variant of uncertain significance (not clinically actionable) was noted in CTNNA1.    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 There was a 4 cm right breast mass with right axillary lymph nodes and evidence  of cirrhosis.  (3) neoadjuvant treatment to consist of cyclophosphamide, methotrexate,  and fluorouracil (CMF) chemotherapy every 21 days 8, starting 11/05/2016  (4) Bilateral mastectomies on 04/04/2017 left breast: DCIS, 1.2 cm, margins negative, right Breast: IMC predominantly lobular, grade 2, 8 cm, Posterior margin focally positive, 10/10 lymph noes involved.  Both are ER/PR positive, invasive tumor was HER-2 negative (ratio 1.48).  (5) adjuvant radiation from 06/02/2017-07/10/2017: 1) Right chest wall/ 50.4 Gy in 28 fractions.  2) L-spine/ 30 Gy in 10 fractions  (6) anti-estrogen therapy to follow at the completion of local treatment  PLAN: Siren is doing better now that she has completed chemotherapy.  She has also completed radiation therapy.  Since Dr. [name] is not in the office today, I reviewed her pathology with him, and her plan.  I'd like to note that she has missed appointments to review her pathology in the past.  The plan is for her to be restaged with a bone scan and CT chest.  She will also need a bone density test.  She will start taking Anastrozole for her breast cancer.  I reviewed the possible side effects of this medication with her in detail.  She will start it when she gets it.  We will get a urinalysis today since she's having dysuria.  I also refilled her Norco.  This pain medication is necessary for her cancer related pain, [place] CSRS reviewed, no red flags.   She will f/u with Dr. [name] in 6 weeks.  She knows to call for any questions or concerns prior to her next appointment with [place].    A total of (30) minutes of face-to-face time was spent with this patient with greater than 50% of that time in counseling and care-coordination.    Wilber Bihari, NP  09/25/17 2:41 PM Medical Oncology and Hematology [facility] [address] [place], [place] [address] Tel. [phone]    Fax. [phone]

## 2017-09-25 ENCOUNTER — Inpatient Hospital Stay: Payer: Medicare Other | Attending: Oncology

## 2017-09-25 ENCOUNTER — Inpatient Hospital Stay (HOSPITAL_BASED_OUTPATIENT_CLINIC_OR_DEPARTMENT_OTHER): Payer: Medicare Other | Admitting: Adult Health

## 2017-09-25 ENCOUNTER — Other Ambulatory Visit: Payer: Self-pay | Admitting: *Deleted

## 2017-09-25 ENCOUNTER — Inpatient Hospital Stay: Payer: Medicare Other

## 2017-09-25 ENCOUNTER — Encounter: Payer: Self-pay | Admitting: Adult Health

## 2017-09-25 ENCOUNTER — Telehealth: Payer: Self-pay | Admitting: Adult Health

## 2017-09-25 VITALS — BP 143/61 | HR 83 | Temp 98.1°F | Resp 17 | Ht 59.0 in | Wt 204.2 lb

## 2017-09-25 DIAGNOSIS — Z452 Encounter for adjustment and management of vascular access device: Secondary | ICD-10-CM | POA: Insufficient documentation

## 2017-09-25 DIAGNOSIS — D0512 Intraductal carcinoma in situ of left breast: Secondary | ICD-10-CM | POA: Insufficient documentation

## 2017-09-25 DIAGNOSIS — G893 Neoplasm related pain (acute) (chronic): Secondary | ICD-10-CM | POA: Diagnosis not present

## 2017-09-25 DIAGNOSIS — R300 Dysuria: Secondary | ICD-10-CM

## 2017-09-25 DIAGNOSIS — Z170 Estrogen receptor positive status [ER+]: Secondary | ICD-10-CM

## 2017-09-25 DIAGNOSIS — C7951 Secondary malignant neoplasm of bone: Secondary | ICD-10-CM | POA: Diagnosis not present

## 2017-09-25 DIAGNOSIS — C50811 Malignant neoplasm of overlapping sites of right female breast: Secondary | ICD-10-CM

## 2017-09-25 DIAGNOSIS — Z95828 Presence of other vascular implants and grafts: Secondary | ICD-10-CM

## 2017-09-25 DIAGNOSIS — C773 Secondary and unspecified malignant neoplasm of axilla and upper limb lymph nodes: Secondary | ICD-10-CM | POA: Diagnosis not present

## 2017-09-25 DIAGNOSIS — K7469 Other cirrhosis of liver: Secondary | ICD-10-CM

## 2017-09-25 DIAGNOSIS — Z6841 Body Mass Index (BMI) 40.0 and over, adult: Secondary | ICD-10-CM

## 2017-09-25 DIAGNOSIS — E2839 Other primary ovarian failure: Secondary | ICD-10-CM

## 2017-09-25 LAB — CBC WITH DIFFERENTIAL/PLATELET
BASOS ABS: 0 10*3/uL (ref 0.0–0.1)
Basophils Relative: 1 %
EOS PCT: 4 %
Eosinophils Absolute: 0.2 10*3/uL (ref 0.0–0.5)
HCT: 31.1 % — ABNORMAL LOW (ref 34.8–46.6)
HEMOGLOBIN: 10.2 g/dL — AB (ref 11.6–15.9)
LYMPHS ABS: 0.8 10*3/uL — AB (ref 0.9–3.3)
LYMPHS PCT: 23 %
MCH: 36 pg — AB (ref 25.1–34.0)
MCHC: 32.8 g/dL (ref 31.5–36.0)
MCV: 109.9 fL — AB (ref 79.5–101.0)
Monocytes Absolute: 0.4 10*3/uL (ref 0.1–0.9)
Monocytes Relative: 11 %
NEUTROS ABS: 2.2 10*3/uL (ref 1.5–6.5)
Neutrophils Relative %: 61 %
PLATELETS: 153 10*3/uL (ref 145–400)
RBC: 2.83 MIL/uL — ABNORMAL LOW (ref 3.70–5.45)
RDW: 14.5 % (ref 11.2–14.5)
WBC: 3.5 10*3/uL — AB (ref 3.9–10.3)

## 2017-09-25 LAB — COMPREHENSIVE METABOLIC PANEL
ALT: 41 U/L (ref 0–55)
ANION GAP: 6 (ref 3–11)
AST: 56 U/L — AB (ref 5–34)
Albumin: 2.9 g/dL — ABNORMAL LOW (ref 3.5–5.0)
Alkaline Phosphatase: 178 U/L — ABNORMAL HIGH (ref 40–150)
BILIRUBIN TOTAL: 0.4 mg/dL (ref 0.2–1.2)
BUN: 12 mg/dL (ref 7–26)
CHLORIDE: 106 mmol/L (ref 98–109)
CO2: 26 mmol/L (ref 22–29)
Calcium: 9 mg/dL (ref 8.4–10.4)
Creatinine, Ser: 0.67 mg/dL (ref 0.60–1.10)
Glucose, Bld: 134 mg/dL (ref 70–140)
POTASSIUM: 3.6 mmol/L (ref 3.5–5.1)
Sodium: 138 mmol/L (ref 136–145)
TOTAL PROTEIN: 6.5 g/dL (ref 6.4–8.3)

## 2017-09-25 LAB — URINALYSIS, COMPLETE (UACMP) WITH MICROSCOPIC
BILIRUBIN URINE: NEGATIVE
GLUCOSE, UA: NEGATIVE mg/dL
Hgb urine dipstick: NEGATIVE
KETONES UR: NEGATIVE mg/dL
Nitrite: NEGATIVE
PH: 5 (ref 5.0–8.0)
PROTEIN: NEGATIVE mg/dL
Specific Gravity, Urine: 1.02 (ref 1.005–1.030)

## 2017-09-25 MED ORDER — ANASTROZOLE 1 MG PO TABS: 1.0000 mg | ORAL_TABLET | Freq: Every day | ORAL | 3 refills | Status: DC

## 2017-09-25 MED ORDER — HEPARIN SOD (PORK) LOCK FLUSH 100 UNIT/ML IV SOLN
500.0000 [IU] | Freq: Once | INTRAVENOUS | Status: AC
  Administered 2017-09-25: 500 [IU]
  Filled 2017-09-25: qty 5

## 2017-09-25 MED ORDER — HYDROCODONE-ACETAMINOPHEN 5-325 MG PO TABS: 1.0000 | ORAL_TABLET | Freq: Four times a day (QID) | ORAL | 0 refills | Status: DC | PRN

## 2017-09-25 MED ORDER — SODIUM CHLORIDE 0.9% FLUSH
10.0000 mL | Freq: Once | INTRAVENOUS | Status: AC
  Administered 2017-09-25: 10 mL
  Filled 2017-09-25: qty 10

## 2017-09-25 NOTE — Telephone Encounter (Signed)
Gave avs and calendar ° °

## 2017-09-26 ENCOUNTER — Telehealth: Payer: Self-pay | Admitting: *Deleted

## 2017-09-26 LAB — URINE CULTURE: Culture: <10000 — AB

## 2017-09-26 MED ORDER — CIPROFLOXACIN HCL 250 MG PO TABS: 250.0000 mg | ORAL_TABLET | Freq: Two times a day (BID) | ORAL | 0 refills | Status: DC

## 2017-09-26 NOTE — Telephone Encounter (Signed)
Call from pt's daughter to report she has her medication and has started it. Daughter confirms she has antibiotic and "estrogen medicine." Will make provider aware.

## 2017-09-26 NOTE — Telephone Encounter (Signed)
This RN attempted to call patient per ned to start an abx for abnormal U/A results- no answer on only number on demographic page with statement " Mailbox is full and cannot leave message ".  This RN contacted alternative contact - her friend - Jasmine Hood. Per discussion with her - informed of need to start ABX asap - with noted concern due to prior undetected UTI resulting in severe confusion requiring admission to the hospital.  Jasmine Hood states she will get in contact with the patient's daughter Jasmine Hood.   If Jasmine Hood is unable to contact pt and or [name] she will call and let this RN know.  Prescription sent to pt's pharmacy.

## 2017-09-27 ENCOUNTER — Other Ambulatory Visit: Payer: Self-pay

## 2017-09-27 ENCOUNTER — Ambulatory Visit
Admission: RE | Admit: 2017-09-27 | Discharge: 2017-09-27 | Disposition: A | Discharge status: Home / Self Care | Payer: Medicare Other | Source: Ambulatory Visit | Attending: Internal Medicine | Admitting: Internal Medicine

## 2017-09-27 DIAGNOSIS — R7989 Other specified abnormal findings of blood chemistry: Secondary | ICD-10-CM

## 2017-10-31 ENCOUNTER — Other Ambulatory Visit: Payer: Self-pay | Admitting: Internal Medicine

## 2017-10-31 DIAGNOSIS — Z170 Estrogen receptor positive status [ER+]: Principal | ICD-10-CM

## 2017-10-31 DIAGNOSIS — C50811 Malignant neoplasm of overlapping sites of right female breast: Secondary | ICD-10-CM

## 2017-11-06 ENCOUNTER — Telehealth: Payer: Self-pay

## 2017-11-06 ENCOUNTER — Inpatient Hospital Stay: Payer: Medicare Other

## 2017-11-06 ENCOUNTER — Encounter: Payer: Self-pay | Admitting: Adult Health

## 2017-11-06 ENCOUNTER — Inpatient Hospital Stay (HOSPITAL_BASED_OUTPATIENT_CLINIC_OR_DEPARTMENT_OTHER): Payer: Medicare Other | Admitting: Adult Health

## 2017-11-06 ENCOUNTER — Inpatient Hospital Stay: Payer: Medicare Other | Attending: Oncology

## 2017-11-06 VITALS — BP 106/69 | HR 88 | Temp 98.3°F | Resp 18 | Ht 59.0 in | Wt 201.4 lb

## 2017-11-06 DIAGNOSIS — C50811 Malignant neoplasm of overlapping sites of right female breast: Secondary | ICD-10-CM | POA: Diagnosis not present

## 2017-11-06 DIAGNOSIS — C773 Secondary and unspecified malignant neoplasm of axilla and upper limb lymph nodes: Secondary | ICD-10-CM | POA: Diagnosis not present

## 2017-11-06 DIAGNOSIS — C7951 Secondary malignant neoplasm of bone: Secondary | ICD-10-CM

## 2017-11-06 DIAGNOSIS — R300 Dysuria: Secondary | ICD-10-CM | POA: Diagnosis not present

## 2017-11-06 DIAGNOSIS — Z170 Estrogen receptor positive status [ER+]: Secondary | ICD-10-CM | POA: Insufficient documentation

## 2017-11-06 DIAGNOSIS — Z79811 Long term (current) use of aromatase inhibitors: Secondary | ICD-10-CM

## 2017-11-06 DIAGNOSIS — Z95828 Presence of other vascular implants and grafts: Secondary | ICD-10-CM

## 2017-11-06 DIAGNOSIS — Z6841 Body Mass Index (BMI) 40.0 and over, adult: Secondary | ICD-10-CM

## 2017-11-06 DIAGNOSIS — K7469 Other cirrhosis of liver: Secondary | ICD-10-CM

## 2017-11-06 LAB — URINALYSIS, COMPLETE (UACMP) WITH MICROSCOPIC
BACTERIA UA: NONE SEEN
BILIRUBIN URINE: NEGATIVE
Glucose, UA: NEGATIVE mg/dL
Hgb urine dipstick: NEGATIVE
KETONES UR: NEGATIVE mg/dL
Nitrite: NEGATIVE
PH: 5 (ref 5.0–8.0)
PROTEIN: NEGATIVE mg/dL
Specific Gravity, Urine: 1.02 (ref 1.005–1.030)

## 2017-11-06 LAB — CBC WITH DIFFERENTIAL/PLATELET
BASOS ABS: 0 10*3/uL (ref 0.0–0.1)
BASOS PCT: 1 %
Eosinophils Absolute: 0.2 10*3/uL (ref 0.0–0.5)
Eosinophils Relative: 5 %
HEMATOCRIT: 31 % — AB (ref 34.8–46.6)
HEMOGLOBIN: 10 g/dL — AB (ref 11.6–15.9)
Lymphocytes Relative: 20 %
Lymphs Abs: 0.8 10*3/uL — ABNORMAL LOW (ref 0.9–3.3)
MCH: 34.7 pg — ABNORMAL HIGH (ref 25.1–34.0)
MCHC: 32.3 g/dL (ref 31.5–36.0)
MCV: 107.6 fL — ABNORMAL HIGH (ref 79.5–101.0)
Monocytes Absolute: 0.4 10*3/uL (ref 0.1–0.9)
Monocytes Relative: 10 %
NEUTROS ABS: 2.5 10*3/uL (ref 1.5–6.5)
NEUTROS PCT: 64 %
Platelets: 173 10*3/uL (ref 145–400)
RBC: 2.88 MIL/uL — ABNORMAL LOW (ref 3.70–5.45)
RDW: 13.7 % (ref 11.2–14.5)
WBC: 3.8 10*3/uL — ABNORMAL LOW (ref 3.9–10.3)

## 2017-11-06 LAB — COMPREHENSIVE METABOLIC PANEL
ALBUMIN: 3 g/dL — AB (ref 3.5–5.0)
ALK PHOS: 154 U/L — AB (ref 40–150)
ALT: 28 U/L (ref 0–55)
AST: 45 U/L — AB (ref 5–34)
Anion gap: 8 (ref 3–11)
BILIRUBIN TOTAL: 0.4 mg/dL (ref 0.2–1.2)
BUN: 11 mg/dL (ref 7–26)
CO2: 25 mmol/L (ref 22–29)
Calcium: 9.3 mg/dL (ref 8.4–10.4)
Chloride: 107 mmol/L (ref 98–109)
Creatinine, Ser: 0.68 mg/dL (ref 0.60–1.10)
GFR calc Af Amer: >60 mL/min (ref >60)
GFR calc non Af Amer: >60 mL/min (ref >60)
GLUCOSE: 121 mg/dL (ref 70–140)
POTASSIUM: 3.8 mmol/L (ref 3.5–5.1)
SODIUM: 140 mmol/L (ref 136–145)
TOTAL PROTEIN: 6.6 g/dL (ref 6.4–8.3)

## 2017-11-06 MED ORDER — SODIUM CHLORIDE 0.9% FLUSH
10.0000 mL | Freq: Once | INTRAVENOUS | Status: AC
  Administered 2017-11-06: 10 mL
  Filled 2017-11-06: qty 10

## 2017-11-06 MED ORDER — HEPARIN SOD (PORK) LOCK FLUSH 100 UNIT/ML IV SOLN
500.0000 [IU] | Freq: Once | INTRAVENOUS | Status: AC
  Administered 2017-11-06: 500 [IU]
  Filled 2017-11-06: qty 5

## 2017-11-06 NOTE — Telephone Encounter (Signed)
After several attempts to call patient getting vm, pt finally answered phone.  Nurse informed her of the following appt's:  *Bone scan @ [facility] 11/12/17 arrive @ 10:30 am *Chest CT @ [facility] arrive @ 10:45 am - liquids only 4 hrs prior  *BD @ [facility] 12/18/17 arrive @ 11:40 am  Patient voiced understanding and wrote down appt information.  Thanked nurse for call.  Knows to call center with any issues.

## 2017-11-06 NOTE — Progress Notes (Signed)
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  She is doing well today.   REVIEW OF SYSTEMS: Brunella is doing well today.  She says she still has intermittent dysuria, but it has improved since starting Cipro.  She has not had any of the imaging studies that I ordered for her last month.  She says she hasn't received any phone calls about this, however we have had difficulty getting in touch with her in the past.  I told Mckynlee and her daughter [name] that we could not get in touch with her last time we needed to talk to her about her urinalysis a month ago that showed a UTI and I wondered if there was an alternate number we should be contacting her on.  Her phone had been cut off, but since it has been turned back on.  She completed all of her antibiotics since that time.  Jasmine Hood is doing well today.  She tells me that she is taking Anastrozole daily and tolerating it well.  She denies joint aches, vaginal dryness, or hot flashes.  A detailed ROS was non contributory.    BREAST CANCER HISTORY: From the original intake note  [name] herself noted a change in her right breast sometime in September 2017. She did not immediately bring it to medical attention. When she noted some significant changes in her right  nipple she saw [name] and was set up for bilateral diagnostic mammography with tomography and right breast [facility] Oct [facility]. This found the breast density to be category B. In the upper and lower outer quadrants of the right breast there was a mass measuring at least 10 cm. There were also groups of heterogeneous calcifications measuring 3 cm and a separate group 0.4 cm. On physical exam there was a palpable firm mass measuring approximately 10 cm involving the upper outer and lower outer quadrant of the right breast, with skin reaction and nipple retraction. There was no palpable right axillary adenopathy.  Right breast ultrasonography confirmed a large hypoechoic mass with irregular margins extending to the skin surface. The right axilla showed 2 lymph nodes which appeared abnormal.  In the left breast there were some indeterminate retroareolar calcifications which were felt to warrant biopsy. This was performed 10/08/2016 and showed a fibroadenoma ([id number]).  Biopsy of the right breast mass at the 7:00 and 10:00 positions as well as one of the suspicious lymph nodes all showed invasive ductal carcinoma, E-cadherin positive. Separate prognostic profiles from the 2 breast masses were sent. Estrogen receptor was positive at 95-70%, progesterone receptor was positive at 15-85%, all with strong staining intensity, the MIB-1 was 15-20%, and HER-2 was nonamplified, the signals ratio being 1.28-1.72 and the number per cell 1.85-3.95.  The patient's subsequent history is as detailed below   PAST MEDICAL HISTORY: Past Medical History:  Diagnosis  Date  . Breast cancer ([facility]) 10/07/2016   right breast, spine  . Brown recluse spider bite    right leg  . COLONIC POLYPS, HX OF 01/05/2009   Qualifier: Diagnosis of  By: [name]  MD, [name]   . Complication of anesthesia    slow to awaken after wisdom  teeth extraction age 3  . Constipation   . Genetic testing  11/27/2016   Ms. Melkonian underwent genetic counseling and testing for hereditary cancer syndromes on 11/19/2016. Her results were negative for mutations in all 46 genes analyzed by [facility]'s 46-gene Common Hereditary Cancers Panel. Genes analyzed include: APC, ATM, AXIN2, BARD1, BMPR1A, BRCA1, BRCA2, BRIP1, CDH1, CDKN2A, CHEK2, CTNNA1, DICER1, EPCAM, GREM1, HOXB13, KIT, MEN1, MLH1, MSH2, MSH3, MSH6, MUTYH, NBN,  . HYPOTHYROIDISM 02/01/2010   Qualifier: Diagnosis of  By: [name]  MD, [name]   . Morbid obesity ([facility]) 01/05/2009   Qualifier: Diagnosis of  By: [name]  MD, [name]   . Pneumonia 1987  . Sleep apnea    no cpap used   . Stroke [facility])    found on CT scan    PAST SURGICAL HISTORY: Past Surgical History:  Procedure Laterality Date  . BILATERAL TOTAL MASTECTOMY WITH AXILLARY LYMPH NODE DISSECTION Bilateral 04/04/2017   Procedure: RIGHT MASTECTOMY WITH TARGETED RIGHT AXILLARY NODE DISSECTION AND LEFT PROPHYLATIC MASTECTOMY ERAS PATHWAY;  Surgeon: Alphonsa Overall, MD;  Location: [facility];  Service: General;  Laterality: Bilateral;  PEC BLOCK  . BREAST BIOPSY Right 10/07/2016   invasive mammary carcinoma  . BREAST BIOPSY Left 10/08/2016   breast fibroadenoma no malignancy  . CATARACT EXTRACTION Bilateral   . COLONOSCOPY W/ BIOPSIES AND POLYPECTOMY  2012  . COLONOSCOPY WITH PROPOFOL N/A 12/22/2015   Procedure: COLONOSCOPY WITH PROPOFOL;  Surgeon: [name], MD;  Location: [facility];  Service: Endoscopy;  Laterality: N/A;  . INCISION AND DRAINAGE Right 1998   surgery for spider bite on RLE; "[name]"  . MASTECTOMY Left 04/04/2017   [name]  . MASTECTOMY WITH AXILLARY LYMPH NODE DISSECTION Right 04/04/2017  . MOLE REMOVAL  10/29/2016   Procedure: MOLE REMOVAL LEFT CHEST;  Surgeon: Alphonsa Overall, MD;  Location: [facility];  Service: General;;  . PORTACATH PLACEMENT N/A 10/29/2016   Procedure: INSERTION PORT-A-CATH;  Surgeon: Alphonsa Overall, MD;  Location: [facility];  Service:  General;  Laterality: N/A;  . TUBAL LIGATION    . WISDOM TOOTH EXTRACTION  age 70    FAMILY HISTORY Family History  Problem Relation Age of Onset  . Breast cancer Mother 79       d.52 from metastatic disease  . Throat cancer Father        d.65 history of smoking  . Rheum arthritis Sister        3 sisters pos. for osteo and RA  . Diabetes Maternal Aunt   . Cancer Maternal Grandfather        mouth cancer-chewed tobacco  . Breast cancer Cousin 2  . Breast cancer Daughter 68  . Stomach cancer Sister 49  The patient's father died at the age of 45 from laryngeal cancer in the setting of tobacco abuse. The patient's mother died at the age of 45 from metastatic breast cancer which had been diagnosed in her early 52s. The patient has 2 half-sisters and one half-brother. One of her sisters has "stomach" cancer. There is also a cousin on the maternal side with breast cancer diagnosed at age 58. No family  member has been genetically tested yet  GYNECOLOGIC HISTORY:  No LMP recorded. Patient is postmenopausal. Menarche age 43, first live birth age 89, she is [place] P5. She is status post bilateral tubal ligation. She stopped having periods in 1985. She tells me she did not use hormone replacement. However a note from her bone density scan December 2001 states "this patient began hormone replacement therapy 3 months ago"  SOCIAL HISTORY:  She is originally from [place]. 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 She has survived 2 husband's period at home currently is just she and her daughter Lourie Retz. She tells me this daughter had mild brain damage at birth and is not working, and cannot drive because of a history of seizures. However she is the one she is planning to name is her healthcare power of attorney. The patient has 13 grandchildren and 4 great-grandchildren. She attends a [employer]: At the 10/21/2016 visit the patient was given the  appropriate documents to complete and notarize at her discretion   HEALTH MAINTENANCE: Social History   Tobacco Use  . Smoking status: Never Smoker  . Smokeless tobacco: Never Used  Substance Use Topics  . Alcohol use: No  . Drug use: No     Colonoscopy: 12/22/2015/Nandigam  PAP:  Bone density: December 2001 was normal   Allergies  Allergen Reactions  . Bee Venom Anaphylaxis    Current Outpatient Medications  Medication Sig Dispense Refill  . anastrozole (ARIMIDEX) 1 MG tablet Take 1 tablet (1 mg total) by mouth daily. 90 tablet 3  . ciprofloxacin (CIPRO) 250 MG tablet Take 1 tablet (250 mg total) by mouth 2 (two) times daily. 14 tablet 0  . HYDROcodone-acetaminophen (NORCO/VICODIN) 5-325 MG tablet Take 1-2 tablets by mouth every 6 (six) hours as needed for moderate pain. 90 tablet 0  . valACYclovir (VALTREX) 500 MG tablet TAKE 1 TABLET BY MOUTH EVERY DAY 30 tablet 0   No current facility-administered medications for this visit.     OBJECTIVE:   Vitals:   11/06/17 1102  BP: 106/69  Pulse: 88  Resp: 18  Temp: 98.3 F (36.8 C)  SpO2: 97%     Body mass index is 40.68 kg/m.    ECOG FS:1 - Symptomatic but completely ambulatory  GENERAL: Patient is a chronically ill appearing older obese female in no acute distress HEENT:  Sclerae anicteric.  Oropharynx clear and moist. No ulcerations or evidence of oropharyngeal candidiasis. Neck is supple.  NODES:  No cervical, supraclavicular, or axillary lymphadenopathy palpated.  BREAST EXAM: deferred today. LUNGS:  Clear to auscultation bilaterally.  No wheezes or rhonchi. HEART:  Regular rate and rhythm. No murmur appreciated. ABDOMEN:  Soft, nontender.  Positive, normoactive bowel sounds. No organomegaly palpated. MSK:  No focal spinal tenderness to palpation. Full range of motion bilaterally in the upper extremities. EXTREMITIES:  No peripheral edema.   SKIN:  Clear with no obvious rashes or skin changes. No nail  dyscrasia. NEURO:  Nonfocal. Well oriented.  Appropriate affect.      LAB RESULTS:  CMP     Component Value Date/Time   NA 140 11/06/2017 0946   NA 140 03/21/2017 0918   K 3.8 11/06/2017 0946   K 3.7 03/21/2017 0918   CL 107 11/06/2017 0946   CO2 25 11/06/2017 0946   CO2 23 03/21/2017 0918   GLUCOSE 121 11/06/2017 0946   GLUCOSE 120 03/21/2017 0918   BUN 11 11/06/2017  0946   BUN 6.6 (L) 03/21/2017 0918   CREATININE 0.68 11/06/2017 0946   CREATININE 0.6 03/21/2017 0918   CALCIUM 9.3 11/06/2017 0946   CALCIUM 8.6 03/21/2017 0918   PROT 6.6 11/06/2017 0946   PROT 6.3 (L) 03/21/2017 0918   ALBUMIN 3.0 (L) 11/06/2017 0946   ALBUMIN 2.4 (L) 03/21/2017 0918   AST 45 (H) 11/06/2017 0946   AST 53 (H) 03/21/2017 0918   ALT 28 11/06/2017 0946   ALT 29 03/21/2017 0918   ALKPHOS 154 (H) 11/06/2017 0946   ALKPHOS 123 03/21/2017 0918   BILITOT 0.4 11/06/2017 0946   BILITOT 0.55 03/21/2017 0918   GFRNONAA >60 11/06/2017 0946   GFRAA >60 11/06/2017 0946    No results found for: TOTALPROTELP, ALBUMINELP, A1GS, A2GS, BETS, BETA2SER, GAMS, MSPIKE, SPEI  No results found for: Nils Pyle, [facility]  Lab Results  Component Value Date   WBC 3.8 (L) 11/06/2017   NEUTROABS 2.5 11/06/2017   HGB 10.0 (L) 11/06/2017   HCT 31.0 (L) 11/06/2017   MCV 107.6 (H) 11/06/2017   PLT 173 11/06/2017      Chemistry      Component Value Date/Time   NA 140 11/06/2017 0946   NA 140 03/21/2017 0918   K 3.8 11/06/2017 0946   K 3.7 03/21/2017 0918   CL 107 11/06/2017 0946   CO2 25 11/06/2017 0946   CO2 23 03/21/2017 0918   BUN 11 11/06/2017 0946   BUN 6.6 (L) 03/21/2017 0918   CREATININE 0.68 11/06/2017 0946   CREATININE 0.6 03/21/2017 0918      Component Value Date/Time   CALCIUM 9.3 11/06/2017 0946   CALCIUM 8.6 03/21/2017 0918   ALKPHOS 154 (H) 11/06/2017 0946   ALKPHOS 123 03/21/2017 0918   AST 45 (H) 11/06/2017 0946   AST 53 (H) 03/21/2017 0918   ALT 28 11/06/2017  0946   ALT 29 03/21/2017 0918   BILITOT 0.4 11/06/2017 0946   BILITOT 0.55 03/21/2017 0918       No results found for: LABCA2  No components found for: [id number]  No results for input(s): INR in the last 168 hours.  Urinalysis    Component Value Date/Time   COLORURINE YELLOW 11/06/2017 1116   APPEARANCEUR CLEAR 11/06/2017 1116   LABSPEC 1.020 11/06/2017 1116   PHURINE 5.0 11/06/2017 1116   GLUCOSEU NEGATIVE 11/06/2017 1116   GLUCOSEU NEGATIVE 01/25/2010 0858   HGBUR NEGATIVE 11/06/2017 1116   HGBUR 1+ 12/30/2008 0838   BILIRUBINUR NEGATIVE 11/06/2017 1116   BILIRUBINUR Neg 04/28/2015 1122   KETONESUR NEGATIVE 11/06/2017 1116   PROTEINUR NEGATIVE 11/06/2017 1116   UROBILINOGEN 0.2 04/28/2015 1122   UROBILINOGEN 0.2 01/25/2010 0858   NITRITE NEGATIVE 11/06/2017 1116   LEUKOCYTESUR SMALL (A) 11/06/2017 1116     STUDIES: No results found.  ELIGIBLE FOR AVAILABLE RESEARCH PROTOCOL: no  ASSESSMENT: 72 y.o. 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 Results are negative for pathogenic mutations in 46 genes analyzed by [facility]'s Common Hereditary Cancers Panel. Results are dated 11/26/2016. Genes tested: APC, ATM, AXIN2, BARD1, BMPR1A, BRCA1, BRCA2, BRIP1, CDH1, CDKN2A, CHEK2, CTNNA1, DICER1, EPCAM, GREM1,  HOXB13, KIT, MEN1, MLH1, MSH2, MSH3, MSH6, MUTYH, NBN, NF1, NTHL1, PALB2, PDGFRA, PMS2, POLD1, POLE, PTEN, RAD50, RAD51C, RAD51D, SDHA, SDHB, SDHC, SDHD, SMAD4, SMARCA4, STK11, TP53, TSC1, TSC2, and VHL.  (a) A  variant of uncertain significance (not clinically actionable) was noted in CTNNA1.    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 There was a 4 cm right breast mass with right axillary lymph nodes and evidence of cirrhosis.  (3) neoadjuvant treatment to consist of cyclophosphamide, methotrexate, and fluorouracil (CMF) chemotherapy every 21 days 8, starting 11/05/2016  (4) Bilateral mastectomies on 04/04/2017 left breast: DCIS, 1.2 cm, margins negative, right Breast: IMC predominantly lobular, grade 2, 8 cm, Posterior margin focally positive, 10/10 lymph noes involved.  Both are ER/PR positive, invasive tumor was HER-2 negative (ratio 1.48).  (5) adjuvant radiation from 06/02/2017-07/10/2017: 1) Right chest wall/ 50.4 Gy in 28 fractions.  2) L-spine/ 30 Gy in 10 fractions  (6) Anastrozole started 09/2017  PLAN: Aziya is doing moderately well today.  I am pleased that she is tolerating the Anastrozole well.  She will continue this.  She needs scans done, and those haven't been scheduled.  We will get those scheduled and call her with the appointment dates and times.  She will need to see Dr. [name] after her scans to review the results.  I will review her case with him on Monday when he returns from surgery.    We did get a urinalysis today that doesn't show bacteria or active infection, however we will wait for the urine culture.    She knows to call for any questions or concerns prior to her next appointment with [place].    A total of (30) minutes of face-to-face time was spent with this patient with greater than 50% of that time in counseling and care-coordination.  Wilber Bihari, NP  11/06/17 4:00 PM Medical Oncology and Hematology [facility] [address] [place], [place] [address] Tel. [phone]    Fax. [phone]

## 2017-11-07 ENCOUNTER — Other Ambulatory Visit: Payer: Self-pay | Admitting: Adult Health

## 2017-11-07 ENCOUNTER — Telehealth: Payer: Self-pay

## 2017-11-07 DIAGNOSIS — Z170 Estrogen receptor positive status [ER+]: Principal | ICD-10-CM

## 2017-11-07 DIAGNOSIS — C50811 Malignant neoplasm of overlapping sites of right female breast: Secondary | ICD-10-CM

## 2017-11-07 LAB — URINE CULTURE: Culture: NO GROWTH

## 2017-11-07 MED ORDER — ANASTROZOLE 1 MG PO TABS: 1.0000 mg | ORAL_TABLET | Freq: Every day | ORAL | 3 refills | Status: DC

## 2017-11-07 MED ORDER — VALACYCLOVIR HCL 500 MG PO TABS: 500.0000 mg | ORAL_TABLET | Freq: Every day | ORAL | 0 refills | Status: DC

## 2017-11-07 MED ORDER — HYDROCODONE-ACETAMINOPHEN 5-325 MG PO TABS: 1.0000 | ORAL_TABLET | Freq: Four times a day (QID) | ORAL | 0 refills | Status: DC | PRN

## 2017-11-07 NOTE — Telephone Encounter (Signed)
Per 6/20 no los

## 2017-11-07 NOTE — Progress Notes (Signed)
Meds refilled.  Would like to note that patient did not request refills at her appointment yesterday.  I did not refill cipro as her urine culture is negative. If she continues to have urinary symptoms in the absence of infections noted on urinalysis and urine culture, would refer to urology for evaluation of possible interstitial cystitis.  [place] PMP [place] database reviewed for patient.  Pain medication last refilled on 09/25/2017.    Wilber Bihari, NP

## 2017-11-07 NOTE — Telephone Encounter (Signed)
Patient called in to inquire about medications to be called in to pharmacy by NP.  She stated she needed all 4 prescriptions called in but did not tell NP at yesterday's appt.  Nurse had to clarify this several times with patient what she meant by all medications.  This information was given to NP for f/u.

## 2017-11-12 ENCOUNTER — Encounter (HOSPITAL_COMMUNITY): Admission: RE | Admit: 2017-11-12 | Payer: Medicare Other | Source: Ambulatory Visit

## 2017-11-12 ENCOUNTER — Encounter (HOSPITAL_COMMUNITY)
Admission: RE | Admit: 2017-11-12 | Discharge: 2017-11-12 | Disposition: A | Discharge status: Home / Self Care | Payer: Medicare Other | Source: Ambulatory Visit | Attending: Adult Health | Admitting: Adult Health

## 2017-11-12 DIAGNOSIS — Z170 Estrogen receptor positive status [ER+]: Secondary | ICD-10-CM | POA: Insufficient documentation

## 2017-11-12 DIAGNOSIS — C50811 Malignant neoplasm of overlapping sites of right female breast: Secondary | ICD-10-CM | POA: Insufficient documentation

## 2017-11-12 MED ORDER — TECHNETIUM TC 99M MEDRONATE IV KIT
20.0000 | PACK | Freq: Once | INTRAVENOUS | Status: AC | PRN
  Administered 2017-11-12: 22 via INTRAVENOUS

## 2017-11-17 ENCOUNTER — Encounter (HOSPITAL_COMMUNITY): Payer: Self-pay

## 2017-11-17 ENCOUNTER — Ambulatory Visit (HOSPITAL_COMMUNITY)
Admission: RE | Admit: 2017-11-17 | Discharge: 2017-11-17 | Disposition: A | Discharge status: Home / Self Care | Payer: Medicare Other | Source: Ambulatory Visit | Attending: Adult Health | Admitting: Adult Health

## 2017-11-17 DIAGNOSIS — Z923 Personal history of irradiation: Secondary | ICD-10-CM | POA: Diagnosis not present

## 2017-11-17 DIAGNOSIS — C50811 Malignant neoplasm of overlapping sites of right female breast: Secondary | ICD-10-CM | POA: Insufficient documentation

## 2017-11-17 DIAGNOSIS — Z170 Estrogen receptor positive status [ER+]: Secondary | ICD-10-CM | POA: Diagnosis not present

## 2017-11-17 DIAGNOSIS — R937 Abnormal findings on diagnostic imaging of other parts of musculoskeletal system: Secondary | ICD-10-CM | POA: Diagnosis not present

## 2017-11-17 DIAGNOSIS — C50919 Malignant neoplasm of unspecified site of unspecified female breast: Secondary | ICD-10-CM | POA: Diagnosis not present

## 2017-11-17 DIAGNOSIS — C7951 Secondary malignant neoplasm of bone: Secondary | ICD-10-CM | POA: Diagnosis not present

## 2017-11-17 DIAGNOSIS — R590 Localized enlarged lymph nodes: Secondary | ICD-10-CM | POA: Insufficient documentation

## 2017-11-17 MED ORDER — IOHEXOL 300 MG/ML  SOLN
75.0000 mL | Freq: Once | INTRAMUSCULAR | Status: AC | PRN
  Administered 2017-11-17: 75 mL via INTRAVENOUS

## 2017-11-17 MED ORDER — HEPARIN SOD (PORK) LOCK FLUSH 100 UNIT/ML IV SOLN
500.0000 [IU] | Freq: Once | INTRAVENOUS | Status: AC
  Administered 2017-11-17: 500 [IU] via INTRAVENOUS

## 2017-11-17 MED ORDER — HEPARIN SOD (PORK) LOCK FLUSH 100 UNIT/ML IV SOLN
INTRAVENOUS | Status: AC
  Filled 2017-11-17: qty 5

## 2017-11-21 DIAGNOSIS — R531 Weakness: Secondary | ICD-10-CM | POA: Diagnosis not present

## 2017-11-28 ENCOUNTER — Inpatient Hospital Stay: Payer: Medicare Other

## 2017-11-28 ENCOUNTER — Inpatient Hospital Stay: Payer: Medicare Other | Attending: Oncology | Admitting: Adult Health

## 2017-11-28 ENCOUNTER — Encounter: Payer: Self-pay | Admitting: Adult Health

## 2017-11-28 ENCOUNTER — Other Ambulatory Visit: Payer: Self-pay | Admitting: Adult Health

## 2017-11-28 VITALS — BP 129/83 | HR 99 | Temp 98.5°F | Resp 18 | Ht 59.0 in | Wt 196.9 lb

## 2017-11-28 DIAGNOSIS — Z170 Estrogen receptor positive status [ER+]: Principal | ICD-10-CM

## 2017-11-28 DIAGNOSIS — C50811 Malignant neoplasm of overlapping sites of right female breast: Secondary | ICD-10-CM

## 2017-11-28 DIAGNOSIS — C7951 Secondary malignant neoplasm of bone: Secondary | ICD-10-CM | POA: Diagnosis not present

## 2017-11-28 DIAGNOSIS — Z95828 Presence of other vascular implants and grafts: Secondary | ICD-10-CM

## 2017-11-28 LAB — BASIC METABOLIC PANEL
ANION GAP: 8 (ref 5–15)
BUN: 12 mg/dL (ref 8–23)
CALCIUM: 9.2 mg/dL (ref 8.9–10.3)
CO2: 24 mmol/L (ref 22–32)
Chloride: 105 mmol/L (ref 98–111)
Creatinine, Ser: 0.71 mg/dL (ref 0.44–1.00)
GFR calc Af Amer: >60 mL/min (ref >60)
GLUCOSE: 143 mg/dL — AB (ref 70–99)
Potassium: 3.8 mmol/L (ref 3.5–5.1)
SODIUM: 137 mmol/L (ref 135–145)

## 2017-11-28 MED ORDER — DENOSUMAB 120 MG/1.7ML ~~LOC~~ SOLN
120.0000 mg | Freq: Once | SUBCUTANEOUS | Status: AC
  Administered 2017-11-28: 120 mg via SUBCUTANEOUS

## 2017-11-28 MED ORDER — HYDROCODONE-ACETAMINOPHEN 5-325 MG PO TABS: 1.0000 | ORAL_TABLET | Freq: Four times a day (QID) | ORAL | 0 refills | Status: DC | PRN

## 2017-11-28 MED ORDER — DENOSUMAB 120 MG/1.7ML ~~LOC~~ SOLN
SUBCUTANEOUS | Status: AC
Start: 2017-11-28 — End: ?
  Filled 2017-11-28: qty 1.7

## 2017-11-28 MED ORDER — HEPARIN SOD (PORK) LOCK FLUSH 100 UNIT/ML IV SOLN
500.0000 [IU] | Freq: Once | INTRAVENOUS | Status: AC
  Administered 2017-11-28: 500 [IU]
  Filled 2017-11-28: qty 5

## 2017-11-28 MED ORDER — SODIUM CHLORIDE 0.9% FLUSH
10.0000 mL | Freq: Once | INTRAVENOUS | Status: AC
  Administered 2017-11-28: 10 mL
  Filled 2017-11-28: qty 10

## 2017-11-28 NOTE — Progress Notes (Addendum)
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  She is here with her daughter Jasmine Hood.    Arnetha continues on Anastrozole with good tolerance.  She denies hot flashes, arthralgias, or vaginal dryness.    She also recently underwent restaging scans and is here to review those results.     REVIEW OF SYSTEMS: Jasmine Hood is doing well today.  She does have lower back pain due to her metastatic bresat cancer.  She takes Norco for this.  She requires 1-2 tablets per day.  Her pain is controlled on this regimen, and her functionality is maintained.  She also denies constipation, and notes that she partakes in a high fiber diet to avoid this.  She has daily bowel movements.    Jasmine Hood reports some pain just above her port.  She notes its been there for a few days and she is unsure what has caused it.  Otherwise Jasmine Hood is doing well and a detailed ROS is non contributory.     BREAST CANCER HISTORY: From the original intake note  Jasmine Hood herself noted a change in her right breast sometime in September 2017. She did not immediately bring it to medical attention. When she noted some significant changes in her right nipple she saw Dr. Burnice Hood and was set up for bilateral diagnostic mammography with tomography and right  bre[facility] Oct [facility]. This found the breast density to be category B. In the upper and lower outer quadrants of the right breast there was a mass measuring at least 10 cm. There were also groups of heterogeneous calcifications measuring 3 cm and a separate group 0.4 cm. On physical exam there was a palpable firm mass measuring approximately 10 cm involving the upper outer and lower outer quadrant of the right breast, with skin reaction and nipple retraction. There was no palpable right axillary adenopathy.  Right breast ultrasonography confirmed a large hypoechoic mass with irregular margins extending to the skin surface. The right axilla showed 2 lymph nodes which appeared abnormal.  In the left breast there were some indeterminate retroareolar calcifications which were felt to warrant biopsy. This was performed 10/08/2016 and showed a fibroadenoma ([id number]).  Biopsy of the right breast mass at the 7:00 and 10:00 positions as well as one of the suspicious lymph nodes all showed invasive ductal carcinoma, E-cadherin positive. Separate prognostic profiles from the 2 breast masses were sent. Estrogen receptor was positive at 95-70%, progesterone receptor was positive at 15-85%, all with strong staining intensity, the MIB-1 was 15-20%, and HER-2 was nonamplified, the signals ratio being 1.28-1.72 and the number per cell 1.85-3.95.  The patient's subsequent history is as detailed below   PAST MEDICAL HISTORY: Past Medical History:  Diagnosis Date  . Breast cancer ([place]) 10/07/2016   right breast, spine  . Brown recluse spider bite    right leg  .  COLONIC POLYPS, HX OF 01/05/2009   Qualifier: Diagnosis of  By: Jasmine Logan  MD, Jasmine Hood   . Complication of anesthesia    slow to awaken after wisdom  teeth extraction age 49  . Constipation   . Genetic testing 11/27/2016   Jasmine Hood underwent genetic counseling and testing for hereditary cancer syndromes on  11/19/2016. Her results were negative for mutations in all 46 genes analyzed by [facility]'s 46-gene Common Hereditary Cancers Panel. Genes analyzed include: APC, ATM, AXIN2, BARD1, BMPR1A, BRCA1, BRCA2, BRIP1, CDH1, CDKN2A, CHEK2, CTNNA1, DICER1, EPCAM, GREM1, HOXB13, KIT, MEN1, MLH1, MSH2, MSH3, MSH6, MUTYH, NBN,  . HYPOTHYROIDISM 02/01/2010   Qualifier: Diagnosis of  By: Jasmine Logan  MD, Jasmine Hood   . Morbid obesity ([place]) 01/05/2009   Qualifier: Diagnosis of  By: Jasmine Logan  MD, Jasmine Hood   . Pneumonia 1987  . Sleep apnea    no cpap used   . Stroke [facility])    found on CT scan    PAST SURGICAL HISTORY: Past Surgical History:  Procedure Laterality Date  . BILATERAL TOTAL MASTECTOMY WITH AXILLARY LYMPH NODE DISSECTION Bilateral 04/04/2017   Procedure: RIGHT MASTECTOMY WITH TARGETED RIGHT AXILLARY NODE DISSECTION AND LEFT PROPHYLATIC MASTECTOMY ERAS PATHWAY;  Surgeon: Jasmine Overall, MD;  Location: [place];  Service: General;  Laterality: Bilateral;  PEC BLOCK  . BREAST BIOPSY Right 10/07/2016   invasive mammary carcinoma  . BREAST BIOPSY Left 10/08/2016   breast fibroadenoma no malignancy  . CATARACT EXTRACTION Bilateral   . COLONOSCOPY W/ BIOPSIES AND POLYPECTOMY  2012  . COLONOSCOPY WITH PROPOFOL N/A 12/22/2015   Procedure: COLONOSCOPY WITH PROPOFOL;  Surgeon: Jasmine Pole, MD;  Location: [facility];  Service: Endoscopy;  Laterality: N/A;  . INCISION AND DRAINAGE Right 1998   surgery for spider bite on RLE; "[name]"  . MASTECTOMY Left 04/04/2017   Jasmine Hood  . MASTECTOMY WITH AXILLARY LYMPH NODE DISSECTION Right 04/04/2017  . MOLE REMOVAL  10/29/2016   Procedure: MOLE REMOVAL LEFT CHEST;  Surgeon: Jasmine Overall, MD;  Location: [facility];  Service: General;;  . PORTACATH PLACEMENT N/A 10/29/2016   Procedure: INSERTION PORT-A-CATH;  Surgeon: Jasmine Overall, MD;  Location: [facility];  Service: General;  Laterality: N/A;  . TUBAL LIGATION    . WISDOM TOOTH EXTRACTION  age 62    FAMILY  HISTORY Family History  Problem Relation Age of Onset  . Breast cancer Mother 25       d.52 from metastatic disease  . Throat cancer Father        d.65 history of smoking  . Rheum arthritis Sister        3 sisters pos. for osteo and RA  . Diabetes Maternal Aunt   . Cancer Maternal Grandfather        mouth cancer-chewed tobacco  . Breast cancer Cousin 69  . Breast cancer Daughter 75  . Stomach cancer Sister 44  The patient's father died at the age of 33 from laryngeal cancer in the setting of tobacco abuse. The patient's mother died at the age of 68 from metastatic breast cancer which had been diagnosed in her early 70s. The patient has 2 half-sisters and one half-brother. One of her sisters has "stomach" cancer. There is also a cousin on the maternal side with breast cancer diagnosed at age 14. No family member has been genetically tested yet  GYNECOLOGIC HISTORY:  No LMP recorded. Patient is postmenopausal. Menarche age 22, first live birth age 71, she  is GX P5. She is status post bilateral tubal ligation. She stopped having periods in 1985. She tells me she did not use hormone replacement. However a note from her bone density scan December 2001 states "this patient began hormone replacement therapy 3 months ago"  SOCIAL HISTORY:  She is originally from [place]. 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 She has survived 2 husband's period at home currently is just she and her daughter Aleya Durnell. She tells me this daughter had mild brain damage at birth and is not working, and cannot drive because of a history of seizures. However she is the one she is planning to name is her healthcare power of attorney. The patient has 13 grandchildren and 4 great-grandchildren. She attends a [employer]: At the 10/21/2016 visit the patient was given the appropriate documents to complete and notarize at her discretion   HEALTH MAINTENANCE: Social  History   Tobacco Use  . Smoking status: Never Smoker  . Smokeless tobacco: Never Used  Substance Use Topics  . Alcohol use: No  . Drug use: No     Colonoscopy: 12/22/2015/Nandigam  PAP:  Bone density: December 2001 was normal   Allergies  Allergen Reactions  . Bee Venom Anaphylaxis    Current Outpatient Medications  Medication Sig Dispense Refill  . anastrozole (ARIMIDEX) 1 MG tablet Take 1 tablet (1 mg total) by mouth daily. 90 tablet 3  . HYDROcodone-acetaminophen (NORCO/VICODIN) 5-325 MG tablet Take 1-2 tablets by mouth every 6 (six) hours as needed for moderate pain. 90 tablet 0  . valACYclovir (VALTREX) 500 MG tablet Take 1 tablet (500 mg total) by mouth daily. 30 tablet 0   No current facility-administered medications for this visit.     OBJECTIVE:   Vitals:   11/28/17 1030  BP: 129/83  Pulse: 99  Resp: 18  Temp: 98.5 F (36.9 C)  SpO2: 98%     Body mass index is 39.77 kg/m.    ECOG FS:1 - Symptomatic but completely ambulatory  GENERAL: Patient is a chronically ill appearing older obese female in no acute distress HEENT:  Sclerae anicteric.  Oropharynx clear and moist. No ulcerations or evidence of oropharyngeal candidiasis. Neck is supple.  NODES:  No cervical, supraclavicular, or axillary lymphadenopathy palpated.  BREAST EXAM: s/p bilateral mastectomies, no nodules, masses noted, there was some tenderness noted in bilateral chest walls along mastectomy scar LUNGS:  Clear to auscultation bilaterally.  No wheezes or rhonchi. HEART:  Regular rate and rhythm. No murmur appreciated. ABDOMEN:  Soft, nontender.  Positive, normoactive bowel sounds. No organomegaly palpated. MSK:  No focal spinal tenderness to palpation. Full range of motion bilaterally in the upper extremities. EXTREMITIES:  No peripheral edema.   SKIN:  Clear with no obvious rashes or skin changes. No nail dyscrasia. NEURO:  Nonfocal. Well oriented.  Appropriate affect.      LAB  RESULTS:  CMP     Component Value Date/Time   NA 140 11/06/2017 0946   NA 140 03/21/2017 0918   K 3.8 11/06/2017 0946   K 3.7 03/21/2017 0918   CL 107 11/06/2017 0946   CO2 25 11/06/2017 0946   CO2 23 03/21/2017 0918   GLUCOSE 121 11/06/2017 0946   GLUCOSE 120 03/21/2017 0918   BUN 11 11/06/2017 0946   BUN 6.6 (L) 03/21/2017 0918   CREATININE 0.68 11/06/2017 0946   CREATININE 0.6 03/21/2017 0918   CALCIUM 9.3 11/06/2017 0946  CALCIUM 8.6 03/21/2017 0918   PROT 6.6 11/06/2017 0946   PROT 6.3 (L) 03/21/2017 0918   ALBUMIN 3.0 (L) 11/06/2017 0946   ALBUMIN 2.4 (L) 03/21/2017 0918   AST 45 (H) 11/06/2017 0946   AST 53 (H) 03/21/2017 0918   ALT 28 11/06/2017 0946   ALT 29 03/21/2017 0918   ALKPHOS 154 (H) 11/06/2017 0946   ALKPHOS 123 03/21/2017 0918   BILITOT 0.4 11/06/2017 0946   BILITOT 0.55 03/21/2017 0918   GFRNONAA >60 11/06/2017 0946   GFRAA >60 11/06/2017 0946    No results found for: TOTALPROTELP, ALBUMINELP, A1GS, A2GS, BETS, BETA2SER, GAMS, MSPIKE, SPEI  No results found for: Nils Pyle, [facility]  Lab Results  Component Value Date   WBC 3.8 (L) 11/06/2017   NEUTROABS 2.5 11/06/2017   HGB 10.0 (L) 11/06/2017   HCT 31.0 (L) 11/06/2017   MCV 107.6 (H) 11/06/2017   PLT 173 11/06/2017      Chemistry      Component Value Date/Time   NA 140 11/06/2017 0946   NA 140 03/21/2017 0918   K 3.8 11/06/2017 0946   K 3.7 03/21/2017 0918   CL 107 11/06/2017 0946   CO2 25 11/06/2017 0946   CO2 23 03/21/2017 0918   BUN 11 11/06/2017 0946   BUN 6.6 (L) 03/21/2017 0918   CREATININE 0.68 11/06/2017 0946   CREATININE 0.6 03/21/2017 0918      Component Value Date/Time   CALCIUM 9.3 11/06/2017 0946   CALCIUM 8.6 03/21/2017 0918   ALKPHOS 154 (H) 11/06/2017 0946   ALKPHOS 123 03/21/2017 0918   AST 45 (H) 11/06/2017 0946   AST 53 (H) 03/21/2017 0918   ALT 28 11/06/2017 0946   ALT 29 03/21/2017 0918   BILITOT 0.4 11/06/2017 0946   BILITOT 0.55  03/21/2017 0918       No results found for: LABCA2  No components found for: [id number]  No results for input(s): INR in the last 168 hours.  Urinalysis    Component Value Date/Time   COLORURINE YELLOW 11/06/2017 1116   APPEARANCEUR CLEAR 11/06/2017 1116   LABSPEC 1.020 11/06/2017 1116   PHURINE 5.0 11/06/2017 1116   GLUCOSEU NEGATIVE 11/06/2017 1116   GLUCOSEU NEGATIVE 01/25/2010 0858   HGBUR NEGATIVE 11/06/2017 1116   HGBUR 1+ 12/30/2008 0838   BILIRUBINUR NEGATIVE 11/06/2017 1116   BILIRUBINUR Neg 04/28/2015 1122   KETONESUR NEGATIVE 11/06/2017 1116   PROTEINUR NEGATIVE 11/06/2017 1116   UROBILINOGEN 0.2 04/28/2015 1122   UROBILINOGEN 0.2 01/25/2010 0858   NITRITE NEGATIVE 11/06/2017 1116   LEUKOCYTESUR SMALL (A) 11/06/2017 1116     STUDIES: Ct Chest W Contrast  Addendum Date: 11/17/2017   ADDENDUM REPORT: 11/17/2017 14:58 ADDENDUM: As mentioned in the body of the report, morphologic changes in the liver are highly suggestive of cirrhosis. Electronically Signed   By: Misty Stanley M.D.   On: 11/17/2017 14:58   Result Date: 11/17/2017 CLINICAL DATA:  Metastatic breast cancer to thoracic spine. EXAM: CT CHEST WITH CONTRAST TECHNIQUE: Multidetector CT imaging of the chest was performed during intravenous contrast administration. CONTRAST:  100m OMNIPAQUE IOHEXOL 300 MG/ML  SOLN COMPARISON:  PET-CT 03/06/2017.  Chest CT 10/30/2016. FINDINGS: Cardiovascular: The heart size is normal. No substantial pericardial effusion. Atherosclerotic calcification is noted in the wall of the thoracic aorta. Left Port-A-Cath tip is positioned at the SVC/RA junction. Mediastinum/Nodes: 12 mm short axis superior mediastinal lymph node (31:2) is new in the interval. 13 mm short axis right paratracheal lymph node  has increased from 6 mm since previous PET-CT. 11 mm short axis AP window lymph node was 4 mm previously. 15 mm short axis subcarinal lymph node was 6 mm previously. Calcified nodal tissue is  identified in the hilar regions bilaterally. The esophagus has normal imaging features. There is no axillary lymphadenopathy. Lungs/Pleura: Subpleural reticulation anterior right lung extending up into the apex is likely radiation related. 2.1 cm nodular opacity seen in the posterior right lung base on previous PET-CT has resolved in the interval. Chronic interstitial changes are associated with mild bilateral bronchiectasis. No focal airspace consolidation. No suspicious pulmonary nodule or mass. No pleural effusion. Upper Abdomen: Enlargement of the lateral segment left liver and caudate lobe is associated with nodular liver contour. No adrenal nodule or mass. 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 Musculoskeletal: Similar appearance heterogeneous mineralization in the thoracic spine. Nuclear medicine bone scan from 11/07/2017 documented decreased uptake in the thoracic spine and L1 vertebral body. Fluid collection in the right axilla is compatible with seroma or chronic postoperative hematoma. IMPRESSION: 1. Interval development of mediastinal lymphadenopathy, concerning for metastatic disease. 2. Post radiation changes anterior right lung. 3. Fluid collection in the right axilla suggests postoperative seroma or chronic hematoma. 4. Similar appearance of heterogeneous mineralization in the thoracic spine. Recent nuclear medicine bone scan demonstrated metastatic involvement. Electronically Signed: By: Misty Stanley M.D. On: 11/17/2017 14:33   Nm Bone Scan Whole Body  Result Date: 11/13/2017 CLINICAL DATA:  72 year old female with a history of metastatic breast cancer (overlapping sites, right breast, estrogen receptor positive) EXAM: NUCLEAR MEDICINE WHOLE BODY BONE SCAN TECHNIQUE: Whole body anterior and posterior images were obtained approximately 3 hours after intravenous injection of radiopharmaceutical. RADIOPHARMACEUTICALS:  22 mCi Technetium-70mMDP IV  COMPARISON:  Brain MRI 09/27/2017; PET-CT 03/06/2017; prior bone scan 10/30/2016 FINDINGS: Normal uptake in the kidneys and bladder. Similar to perhaps slightly increased diffuse patchy uptake throughout the calvarium consistent with diffuse calvarial Mets. Uptake in the bilateral humeral heads, coracoid processes and acromion processes is favored to represent degenerative change. No definite new foci of uptake in the remainder of the axial or appendicular skeleton. Previously noted uptake in the thoracic spine and L1 vertebral body are less conspicuous on today's examination. IMPRESSION: 1. Similar to slightly increased diffuse patchy uptake throughout the calvarium consistent with multifocal calvarial metastatic disease. 2. Previously noted thoracic and L1 vertebral body uptake is less conspicuous on today's examination suggesting an interval response to therapy. 3. Degenerative changes in both shoulder girdles. 4. No new osseous metastatic disease identified. Electronically Signed   By: HJacqulynn CadetM.D.   On: 11/13/2017 08:50    ELIGIBLE FOR AVAILABLE RESEARCH PROTOCOL: no  ASSESSMENT: 72y.o. [place], [place] woman status post right breast overlapping sites biopsy 2 and lymph node biopsy 10/07/2016 all positive for an invasive ductal carcinoma, grade 2, estrogen and progesterone receptor positive, HER-2 nonamplified, with an MIB-1 between 15 and 20%.--This is clinical stage IIIB  (a) breast MRI suggests left-sided disease, biopsy 11/18/2016 shows atypical ductal hyperplasia  (1) genetics testing 11/19/2016--Genetic counseling and testing for hereditary cancer syndromes performed on 11/19/2016. Results are negative for pathogenic mutations in 46 genes analyzed by [facility]'s Common Hereditary Cancers Panel. Results are dated 11/26/2016. Genes tested: APC, ATM, AXIN2, BARD1, BMPR1A, BRCA1, BRCA2, BRIP1, CDH1, CDKN2A, CHEK2, CTNNA1, DICER1, EPCAM, GREM1, HOXB13, KIT, MEN1, MLH1, MSH2, MSH3, MSH6,  MUTYH, NBN, NF1, NTHL1, PALB2, PDGFRA, PMS2, POLD1, Hood, PTEN, RAD50, RAD51C,  RAD51D, SDHA, SDHB, SDHC, SDHD, SMAD4, SMARCA4, STK11, TP53, TSC1, TSC2, and VHL.  (a) A variant of uncertain significance (not clinically actionable) was noted in CTNNA1.    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 There was a 4 cm right breast mass with right axillary lymph nodes and evidence of cirrhosis.  (3) neoadjuvant treatment to consist of cyclophosphamide, methotrexate, and fluorouracil (CMF) chemotherapy every 21 days 8, starting 11/05/2016  (4) Bilateral mastectomies on 04/04/2017 left breast: DCIS, 1.2 cm, margins negative, right Breast: IMC predominantly lobular, grade 2, 8 cm, Posterior margin focally positive, 10/10 lymph noes involved.  Both are ER/PR positive, invasive tumor was HER-2 negative (ratio 1.48).  (5) adjuvant radiation from 06/02/2017-07/10/2017: 1) Right chest wall/ 50.4 Gy in 28 fractions.  2) L-spine/ 30 Gy in 10 fractions  (6) Anastrozole started 09/2017  (7) Xgeva to restart on 11/28/17 and to be administered every 12 weeks.    PLAN: Satara is doing well today.  She met with Dr. [name] to review her scans.  She is tolerating Anastrozole well with no sign of progression.  We will continue this.  She will also resume Xgeva today.  She will have her port flushed as well.    She notes that she would like a plastic surgery consult about her surgical site.  We will place that referral today to Dr. Iran Planas.    Ladaisha's pain is from the metastatic breast cancer in the bone.  It is controlled with the Norco.  She will continue this.  I reviewed PMP aware database.  She is due for refill next week, so I sent that in for her today.  I reviewed her pain regimen with Dr. [name], who is in agreement with it.    Shatia will return in 6 weeks for a port flush, and in 12 weeks for labs,  f/u with Dr. [name], xgeva.  She will be restaged with bone scan and CT chest at that point.    She knows to call for any questions or concerns prior to her next appointment with [place].     Wilber Bihari, NP  11/28/17 10:55 AM Medical Oncology and Hematology [facility] [address] [place], [place] [address] Tel. [phone]    Fax. [phone]   ADDENDUM: Nasreen is tolerating anastrozole well and her restaging studies--really her new baseline studies--are generally favorable.  We will need to watch the adenopathy which may or may not be related to her cancer.  The plan at this point is to continue the anastrozole and restage in 3 months.  Velicia has a transportation problem and had some no-shows the result of all of that was that she did not continue receiving her denosumab.  On the other hand she tolerated it well.  Today we again discussed the benefits of that intervention as well as the possible toxicity side effects and complications.  She will receive a dose today and we we will continue to on an every 41-monthbasis which I think she will be able to manage.  We will continue to manage her medication as above.  We checked with PMP aware today. I spent approximately 30 minutes with LThiwith more than 50% of that time spent in counseling and coordination of care.  I personally saw this patient and performed a substantive portion of this encounter with the listed APP documented above.   GVirgie Hood  Magrinat, MD Medical Oncology and Hematology [facility] [address] [place], [place] [address] Tel. [phone]    Fax. [phone]

## 2017-11-28 NOTE — Patient Instructions (Signed)
Central Line, Adult A central line is a thin, flexible tube (catheter) that is put in your vein. It can be used to:  Take blood for lab tests.  Give you medicine.  Give you food and nutrients.  The procedure may vary among doctors and hospitals. Follow these instructions at home: Caring for the tube  Follow instructions from your doctor about: ? Flushing the tube with saline solution. ? Cleaning the tube and the area around it.  Only flush with clean (sterile) supplies. The supplies should be from your doctor, a pharmacy, or another place that your doctor recommends.  Before you flush the tube or clean the area around the tube: ? Wash your hands with soap and water. If you cannot use soap and water, use hand sanitizer. ? Clean the central line hub with rubbing alcohol. Caring for your skin  Keep the area where the tube was put in clean and dry.  Every day, and when changing the bandage, check the skin around the central line for: ? Redness, swelling, or pain. ? Fluid or blood. ? Warmth. ? Pus. ? A bad smell. General instructions  Keep the tube clamped, unless it is being used.  Keep your supplies in a clean, dry location.  If you or someone else accidentally pulls on the tube, make sure: ? The bandage (dressing) is okay. ? There is no bleeding. ? The tube has not been pulled out.  Do not use scissors or sharp objects near the tube.  Do not swim or let the tube soak in a tub.  Ask your doctor what activities are safe for you. Your doctor may tell you not to lift anything or move your arm too much.  Take over-the-counter and prescription medicines only as told by your doctor.  Change bandages as told by your doctor.  Keep your bandage dry. If a bandage gets wet, have it changed right away.  Keep all follow-up visits as told by your doctor. This is important. Throwing away supplies  Throw away any syringes in a trash (disposal) container that is only for sharp  items (sharps container). You can buy a sharps container from a pharmacy, or you can make one by using an empty hard plastic bottle with a cover.  Place any used bandages or infusion bags into a plastic bag. Throw that bag in the trash. Contact a doctor if:  You have any of these where the tube was put in: ? Redness, swelling, or pain. ? Fluid or blood. ? A warm feeling. ? Pus or a bad smell. Get help right away if:  You have: ? A fever. ? Chills. ? Trouble getting enough air (shortness of breath). ? Trouble breathing. ? Pain in your chest. ? Swelling in your neck, face, chest, or arm.  You are coughing.  You feel your heart beating fast or skipping beats.  You feel dizzy or you pass out (faint).  There are red lines coming from where the tube was put in.  The area where the tube was put in is bleeding and the bleeding will not stop.  Your tube is hard to flush.  You do not get a blood return from the tube.  The tube gets loose or comes out.  The tube has a hole or a tear.  The tube leaks. Summary  A central line is a thin, flexible tube (catheter) that is put in your vein. It can be used to take blood for lab tests or to   give you medicine.  Follow instructions from your doctor about flushing and cleaning the tube.  Keep the area where the tube was put in clean and dry.  Ask your doctor what activities are safe for you. This information is not intended to replace advice given to you by your health care provider. Make sure you discuss any questions you have with your health care provider. Document Released: 04/22/2012 Document Revised: 05/23/2016 Document Reviewed: 05/23/2016 Elsevier Interactive Patient Education  2017 [employer].   Denosumab injection What is this medicine? DENOSUMAB (den oh sue mab) slows bone breakdown. Prolia is used to treat osteoporosis in women after menopause and in men. Delton See is used to treat a high calcium level due to cancer and to  prevent bone fractures and other bone problems caused by multiple myeloma or cancer bone metastases. Delton See is also used to treat giant cell tumor of the bone. This medicine may be used for other purposes; ask your health care provider or pharmacist if you have questions. COMMON BRAND NAME(S): Prolia, XGEVA What should I tell my health care provider before I take this medicine? They need to know if you have any of these conditions: -dental disease -having surgery or tooth extraction -infection -kidney disease -low levels of calcium or Vitamin D in the blood -malnutrition -on hemodialysis -skin conditions or sensitivity -thyroid or parathyroid disease -an unusual reaction to denosumab, other medicines, foods, dyes, or preservatives -pregnant or trying to get pregnant -breast-feeding How should I use this medicine? This medicine is for injection under the skin. It is given by a health care professional in a hospital or clinic setting. If you are getting Prolia, a special MedGuide will be given to you by the pharmacist with each prescription and refill. Be sure to read this information carefully each time. For Prolia, talk to your pediatrician regarding the use of this medicine in children. Special care may be needed. For Delton See, talk to your pediatrician regarding the use of this medicine in children. While this drug may be prescribed for children as young as 13 years for selected conditions, precautions do apply. Overdosage: If you think you have taken too much of this medicine contact a poison control center or emergency room at once. NOTE: This medicine is only for you. Do not share this medicine with others. What if I miss a dose? It is important not to miss your dose. Call your doctor or health care professional if you are unable to keep an appointment. What may interact with this medicine? Do not take this medicine with any of the following medications: -other medicines containing  denosumab This medicine may also interact with the following medications: -medicines that lower your chance of fighting infection -steroid medicines like prednisone or cortisone This list may not describe all possible interactions. Give your health care provider a list of all the medicines, herbs, non-prescription drugs, or dietary supplements you use. Also tell them if you smoke, drink alcohol, or use illegal drugs. Some items may interact with your medicine. What should I watch for while using this medicine? Visit your doctor or health care professional for regular checks on your progress. Your doctor or health care professional may order blood tests and other tests to see how you are doing. Call your doctor or health care professional for advice if you get a fever, chills or sore throat, or other symptoms of a cold or flu. Do not treat yourself. This drug may decrease your body's ability to fight infection. Try  to avoid being around people who are sick. You should make sure you get enough calcium and vitamin D while you are taking this medicine, unless your doctor tells you not to. Discuss the foods you eat and the vitamins you take with your health care professional. See your dentist regularly. Brush and floss your teeth as directed. Before you have any dental work done, tell your dentist you are receiving this medicine. Do not become pregnant while taking this medicine or for 5 months after stopping it. Talk with your doctor or health care professional about your birth control options while taking this medicine. Women should inform their doctor if they wish to become pregnant or think they might be pregnant. There is a potential for serious side effects to an unborn child. Talk to your health care professional or pharmacist for more information. What side effects may I notice from receiving this medicine? Side effects that you should report to your doctor or health care professional as soon as  possible: -allergic reactions like skin rash, itching or hives, swelling of the face, lips, or tongue -bone pain -breathing problems -dizziness -jaw pain, especially after dental work -redness, blistering, peeling of the skin -signs and symptoms of infection like fever or chills; cough; sore throat; pain or trouble passing urine -signs of low calcium like fast heartbeat, muscle cramps or muscle pain; pain, tingling, numbness in the hands or feet; seizures -unusual bleeding or bruising -unusually weak or tired Side effects that usually do not require medical attention (report to your doctor or health care professional if they continue or are bothersome): -constipation -diarrhea -headache -joint pain -loss of appetite -muscle pain -runny nose -tiredness -upset stomach This list may not describe all possible side effects. Call your doctor for medical advice about side effects. You may report side effects to FDA at 1-800-FDA-1088. Where should I keep my medicine? This medicine is only given in a clinic, doctor's office, or other health care setting and will not be stored at home. NOTE: This sheet is a summary. It may not cover all possible information. If you have questions about this medicine, talk to your doctor, pharmacist, or health care provider.  2018 Elsevier/Gold Standard (2016-05-28 19:17:21)

## 2017-12-15 DIAGNOSIS — C7951 Secondary malignant neoplasm of bone: Secondary | ICD-10-CM | POA: Diagnosis not present

## 2017-12-15 DIAGNOSIS — R5381 Other malaise: Secondary | ICD-10-CM | POA: Diagnosis not present

## 2017-12-18 ENCOUNTER — Ambulatory Visit
Admission: RE | Admit: 2017-12-18 | Discharge: 2017-12-18 | Disposition: A | Discharge status: Home / Self Care | Payer: Medicare Other | Source: Ambulatory Visit | Attending: Adult Health | Admitting: Adult Health

## 2017-12-18 ENCOUNTER — Telehealth: Payer: Self-pay

## 2017-12-18 DIAGNOSIS — Z13820 Encounter for screening for osteoporosis: Secondary | ICD-10-CM | POA: Diagnosis not present

## 2017-12-18 DIAGNOSIS — E2839 Other primary ovarian failure: Secondary | ICD-10-CM

## 2017-12-18 DIAGNOSIS — Z780 Asymptomatic menopausal state: Secondary | ICD-10-CM | POA: Diagnosis not present

## 2017-12-18 NOTE — Telephone Encounter (Signed)
Spoke with pt to inform of normal BD results.  Pt voiced understanding and thanks.  No questions or concerns at this time.

## 2017-12-18 NOTE — Telephone Encounter (Signed)
-----   Message from Gardenia Phlegm, NP sent at 12/18/2017  1:03 PM EDT ----- Please let Judith know that her bone density is normal.    Thanks,  Mendel Ryder ----- Message ----- From: Interface, Rad Results In Sent: 12/18/2017  12:45 PM To: Gardenia Phlegm, NP

## 2018-01-09 ENCOUNTER — Inpatient Hospital Stay: Payer: Medicare Other | Attending: Oncology

## 2018-01-09 ENCOUNTER — Other Ambulatory Visit: Payer: Self-pay | Admitting: *Deleted

## 2018-01-09 DIAGNOSIS — Z95828 Presence of other vascular implants and grafts: Secondary | ICD-10-CM

## 2018-01-09 DIAGNOSIS — Z170 Estrogen receptor positive status [ER+]: Principal | ICD-10-CM

## 2018-01-09 DIAGNOSIS — C7951 Secondary malignant neoplasm of bone: Secondary | ICD-10-CM

## 2018-01-09 DIAGNOSIS — Z452 Encounter for adjustment and management of vascular access device: Secondary | ICD-10-CM | POA: Diagnosis not present

## 2018-01-09 DIAGNOSIS — C50811 Malignant neoplasm of overlapping sites of right female breast: Secondary | ICD-10-CM

## 2018-01-09 MED ORDER — SODIUM CHLORIDE 0.9% FLUSH
10.0000 mL | Freq: Once | INTRAVENOUS | Status: AC
  Administered 2018-01-09: 10 mL
  Filled 2018-01-09: qty 10

## 2018-01-09 MED ORDER — HYDROCODONE-ACETAMINOPHEN 5-325 MG PO TABS: 1.0000 | ORAL_TABLET | Freq: Four times a day (QID) | ORAL | 0 refills | Status: DC | PRN

## 2018-01-09 MED ORDER — HEPARIN SOD (PORK) LOCK FLUSH 100 UNIT/ML IV SOLN
500.0000 [IU] | Freq: Once | INTRAVENOUS | Status: AC
  Administered 2018-01-09: 500 [IU]
  Filled 2018-01-09: qty 5

## 2018-01-09 NOTE — Telephone Encounter (Signed)
This RN spoke with pt and her sister per lab draw today and inquiry regarding " is it ok for me to take a trip to [place]?"  This RN informed pt yes - discussed stopping and walking around the car frequently during the trip to aid in circulation and decreased pain.  Sister stated concern " what if she has to go into the hospital for anything while there - how will they know about her cancer "  This RN informed sister of records available on [place]- as well as multiple pt who have traveled and needed medical care - facilities call and records are provided.  Refill given today for pt's Norco - and this RN did state need to obtain new prescription prior to leaving on trip- due to this med cannot be refilled by this office to an out of state pharmacy.

## 2018-01-13 ENCOUNTER — Other Ambulatory Visit: Payer: Self-pay | Admitting: *Deleted

## 2018-01-13 DIAGNOSIS — Z170 Estrogen receptor positive status [ER+]: Principal | ICD-10-CM

## 2018-01-13 DIAGNOSIS — C50811 Malignant neoplasm of overlapping sites of right female breast: Secondary | ICD-10-CM

## 2018-01-13 MED ORDER — VALACYCLOVIR HCL 500 MG PO TABS: 500.0000 mg | ORAL_TABLET | Freq: Every day | ORAL | 0 refills | Status: DC

## 2018-01-15 DIAGNOSIS — C7951 Secondary malignant neoplasm of bone: Secondary | ICD-10-CM | POA: Diagnosis not present

## 2018-01-15 DIAGNOSIS — R5381 Other malaise: Secondary | ICD-10-CM | POA: Diagnosis not present

## 2018-02-10 ENCOUNTER — Other Ambulatory Visit: Payer: Self-pay | Admitting: Oncology

## 2018-02-10 DIAGNOSIS — C50811 Malignant neoplasm of overlapping sites of right female breast: Secondary | ICD-10-CM

## 2018-02-10 DIAGNOSIS — Z170 Estrogen receptor positive status [ER+]: Principal | ICD-10-CM

## 2018-02-18 NOTE — Progress Notes (Signed)
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 Jasmine Hood continues on anastrozole, with good tolerance. She denies issues with hot flashes or vaginal dryness.    She also receives denosumab/Xgeva, with no side effects that she is aware of.  She is scheduled for a chest CT and whole body bone scan on 02/20/2018.   REVIEW OF SYSTEMS: Jasmine Hood reports that she is having issues with diarrhea. She has constant pain and numbness in her right fist more than the left, as well as both legs. She tries to walk, which helps a little. She denies being diabetic. She is not taking any pain medicines. She went on vacation to [place] to visit family. The weather got up to 102 degrees, but she visited plenty of attractions there. She denies unusual headaches, visual changes, nausea, vomiting, or dizziness. There has been no unusual cough, phlegm production, or pleurisy. There has been no change in bowel or bladder habits. She denies unexplained fatigue or unexplained weight loss, bleeding, rash, or fever. A detailed review of systems was otherwise stable.    BREAST CANCER HISTORY: From the original intake note  Jasmine Hood herself noted a change in her right breast sometime in September 2017. She did not immediately bring it to  medical attention. When she noted some significant changes in her right nipple she saw [name] and was set up for bilateral diagnostic mammography with tomography and [facility]. This found the breast density to be category B. In the upper and lower outer quadrants of the right breast there was a mass measuring at least 10 cm. There were also groups of heterogeneous calcifications measuring 3 cm and a separate group 0.4 cm. On physical exam there was a palpable firm mass measuring approximately 10 cm involving the upper outer and lower outer quadrant of the right breast, with skin reaction and nipple retraction. There was no palpable right axillary adenopathy.  Right breast ultrasonography confirmed a large hypoechoic mass with irregular margins extending to the skin surface. The right axilla showed 2 lymph nodes which appeared abnormal.  In the left breast there were some indeterminate retroareolar calcifications which were felt to warrant biopsy. This was performed 10/08/2016 and showed a fibroadenoma ([id number]).  Biopsy of the right breast mass at the 7:00 and 10:00 positions as well as one of the suspicious lymph nodes all showed invasive ductal carcinoma, E-cadherin positive. Separate prognostic profiles from the 2 breast masses were sent. Estrogen receptor was positive at 95-70%, progesterone receptor was positive at 15-85%, all with strong staining intensity, the MIB-1 was 15-20%, and HER-2 was nonamplified, the signals ratio being 1.28-1.72 and the number per cell 1.85-3.95.  The patient's subsequent history is as detailed below   PAST MEDICAL HISTORY: Past Medical History:  Diagnosis Date  . Breast cancer ([place]) 10/07/2016   right breast, spine  . [place]  recluse spider bite    right leg  . COLONIC POLYPS, HX OF 01/05/2009   Qualifier: Diagnosis of  By: [name]  MD, [name]   . Complication of anesthesia    slow to awaken  after wisdom  teeth extraction age 35  . Constipation   . Genetic testing 11/27/2016   Jasmine Hood underwent genetic counseling and testing for hereditary cancer syndromes on 11/19/2016. Her results were negative for mutations in all 46 genes analyzed by [facility]'s 46-gene Common Hereditary Cancers Panel. Genes analyzed include: APC, ATM, AXIN2, BARD1, BMPR1A, BRCA1, BRCA2, BRIP1, CDH1, CDKN2A, CHEK2, CTNNA1, DICER1, EPCAM, GREM1, HOXB13, KIT, MEN1, MLH1, MSH2, MSH3, MSH6, MUTYH, NBN,  . HYPOTHYROIDISM 02/01/2010   Qualifier: Diagnosis of  By: [name]  MD, [name]   . Morbid obesity ([place]) 01/05/2009   Qualifier: Diagnosis of  By: [name]  MD, [name]   . Pneumonia 1987  . Sleep apnea    no cpap used   . Stroke [facility])    found on CT scan    PAST SURGICAL HISTORY: Past Surgical History:  Procedure Laterality Date  . BILATERAL TOTAL MASTECTOMY WITH AXILLARY LYMPH NODE DISSECTION Bilateral 04/04/2017   Procedure: RIGHT MASTECTOMY WITH TARGETED RIGHT AXILLARY NODE DISSECTION AND LEFT PROPHYLATIC MASTECTOMY ERAS PATHWAY;  Surgeon: Alphonsa Overall, MD;  Location: [facility];  Service: General;  Laterality: Bilateral;  PEC BLOCK  . BREAST BIOPSY Right 10/07/2016   invasive mammary carcinoma  . BREAST BIOPSY Left 10/08/2016   breast fibroadenoma no malignancy  . CATARACT EXTRACTION Bilateral   . COLONOSCOPY W/ BIOPSIES AND POLYPECTOMY  2012  . COLONOSCOPY WITH PROPOFOL N/A 12/22/2015   Procedure: COLONOSCOPY WITH PROPOFOL;  Surgeon: [name], MD;  Location: [facility];  Service: Endoscopy;  Laterality: N/A;  . INCISION AND DRAINAGE Right 1998   surgery for spider bite on RLE; "[name]"  . MASTECTOMY Left 04/04/2017   Jasmine Hood  . MASTECTOMY WITH AXILLARY LYMPH NODE DISSECTION Right 04/04/2017  . MOLE REMOVAL  10/29/2016   Procedure: MOLE REMOVAL LEFT CHEST;  Surgeon: Alphonsa Overall, MD;  Location: [facility];  Service: General;;  . PORTACATH PLACEMENT N/A 10/29/2016   Procedure:  INSERTION PORT-A-CATH;  Surgeon: Alphonsa Overall, MD;  Location: [facility];  Service: General;  Laterality: N/A;  . TUBAL LIGATION    . WISDOM TOOTH EXTRACTION  age 71    FAMILY HISTORY Family History  Problem Relation Age of Onset  . Breast cancer Mother 81       d.52 from metastatic disease  . Throat cancer Father        d.65 history of smoking  . Rheum arthritis Sister        3 sisters pos. for osteo and RA  . Diabetes Maternal Aunt   . Cancer Maternal Grandfather        mouth cancer-chewed tobacco  . Breast cancer Cousin 62  . Breast cancer Daughter 65  . Stomach cancer Sister 53  The patient's father died at the age of 89 from laryngeal cancer in the setting of tobacco abuse. The patient's mother died at the age of 8 from metastatic breast cancer which had been diagnosed in her early 48s. The patient has 2 half-sisters and one half-brother. One of her sisters has "stomach" cancer. There is also a cousin on the maternal side with breast cancer diagnosed at age 79. No family member has been genetically tested yet  GYNECOLOGIC HISTORY:  No LMP recorded. Patient is  postmenopausal. Menarche age 87, first live birth age 14, she is [place] P5. She is status post bilateral tubal ligation. She stopped having periods in 1985. She tells me she did not use hormone replacement. However a note from her bone density scan December 2001 states "this patient began hormone replacement therapy 3 months ago"  SOCIAL HISTORY:  She is originally from [place]. She worked for [employer], but she is now retired. She has survived 2 husbands. At home currently is just she and her daughter Jasmine Hood. She tells me this daughter had mild brain damage at birth and is not working, and cannot drive because of a history of seizures. However she is the one she is planning to name is her healthcare power of attorney. The patient has 13 grandchildren and 4 great-grandchildren. She attends a [employer]: At the 10/21/2016 visit the patient was given the appropriate documents to complete and notarize at her discretion.  She intends to name her youngest duaghter Jasmine [place], lives [place], as her healthcare power of attorney.    HEALTH MAINTENANCE: Social History   Tobacco Use  . Smoking status: Never Smoker  . Smokeless tobacco: Never Used  Substance Use Topics  . Alcohol use: No  . Drug use: No     Colonoscopy: 12/22/2015/Nandigam  PAP:  Bone density: 12/18/2017 with a T score of -0.7   Allergies  Allergen Reactions  . Bee Venom Anaphylaxis    Current Outpatient Medications  Medication Sig Dispense Refill  . anastrozole (ARIMIDEX) 1 MG tablet Take 1 tablet (1 mg total) by mouth daily. 90 tablet 3  . HYDROcodone-acetaminophen (NORCO/VICODIN) 5-325 MG tablet Take 1-2 tablets by mouth every 6 (six) hours as needed for moderate pain. 90 tablet 0  . valACYclovir (VALTREX) 500 MG tablet TAKE 1 TABLET BY MOUTH EVERY DAY 30 tablet 0   No current facility-administered medications for this visit.     OBJECTIVE: Older white woman who appears stated age  72:   02/19/18 1058  BP: 129/86  Pulse: 84  Resp: 18  Temp: 98.6 F (37 C)  SpO2: 98%     Body mass index is 41.06 kg/m.    ECOG FS:1 - Symptomatic but completely ambulatory   Sclerae unicteric, EOMs intact No cervical or supraclavicular adenopathy Lungs no rales or rhonchi Heart regular rate and rhythm Abd soft, nontender, positive bowel sounds MSK no focal spinal tenderness, no upper extremity lymphedema Neuro: nonfocal, well oriented, appropriate affect Breasts: Status post bilateral mastectomies and bilateral radiation.  There is no evidence of disease recurrence.  The scars on the right are very firm and fixed and if there were disease under the scar it would be very difficult to demonstrate by exam.  Both axillae are benign   LAB RESULTS:  CMP     Component Value  Date/Time   NA 137 11/28/2017 1143   NA 140 03/21/2017 0918   K 3.8 11/28/2017 1143   K 3.7 03/21/2017 0918   CL 105 11/28/2017 1143   CO2 24 11/28/2017 1143   CO2 23 03/21/2017 0918   GLUCOSE 143 (H) 11/28/2017 1143   GLUCOSE 120 03/21/2017 0918   BUN 12 11/28/2017 1143   BUN 6.6 (L) 03/21/2017 0918   CREATININE 0.71 11/28/2017 1143   CREATININE 0.6 03/21/2017 0918   CALCIUM 9.2 11/28/2017 1143   CALCIUM 8.6 03/21/2017 0918   PROT 6.6 11/06/2017 0946   PROT 6.3 (L) 03/21/2017 5638  ALBUMIN 3.0 (L) 11/06/2017 0946   ALBUMIN 2.4 (L) 03/21/2017 0918   AST 45 (H) 11/06/2017 0946   AST 53 (H) 03/21/2017 0918   ALT 28 11/06/2017 0946   ALT 29 03/21/2017 0918   ALKPHOS 154 (H) 11/06/2017 0946   ALKPHOS 123 03/21/2017 0918   BILITOT 0.4 11/06/2017 0946   BILITOT 0.55 03/21/2017 0918   GFRNONAA >60 11/28/2017 1143   GFRAA >60 11/28/2017 1143    No results found for: Ronnald Ramp, A1GS, A2GS, BETS, BETA2SER, GAMS, MSPIKE, SPEI  No results found for: Nils Pyle, [facility]  Lab Results  Component Value Date   WBC 3.7 (L) 02/19/2018   NEUTROABS 2.6 02/19/2018   HGB 10.2 (L) 02/19/2018   HCT 31.4 (L) 02/19/2018   MCV 97.1 02/19/2018   PLT 168 02/19/2018      Chemistry      Component Value Date/Time   NA 137 11/28/2017 1143   NA 140 03/21/2017 0918   K 3.8 11/28/2017 1143   K 3.7 03/21/2017 0918   CL 105 11/28/2017 1143   CO2 24 11/28/2017 1143   CO2 23 03/21/2017 0918   BUN 12 11/28/2017 1143   BUN 6.6 (L) 03/21/2017 0918   CREATININE 0.71 11/28/2017 1143   CREATININE 0.6 03/21/2017 0918      Component Value Date/Time   CALCIUM 9.2 11/28/2017 1143   CALCIUM 8.6 03/21/2017 0918   ALKPHOS 154 (H) 11/06/2017 0946   ALKPHOS 123 03/21/2017 0918   AST 45 (H) 11/06/2017 0946   AST 53 (H) 03/21/2017 0918   ALT 28 11/06/2017 0946   ALT 29 03/21/2017 0918   BILITOT 0.4 11/06/2017 0946   BILITOT 0.55 03/21/2017 0918       No results  found for: LABCA2  No components found for: [id number]  No results for input(s): INR in the last 168 hours.  Urinalysis    Component Value Date/Time   COLORURINE YELLOW 11/06/2017 1116   APPEARANCEUR CLEAR 11/06/2017 1116   LABSPEC 1.020 11/06/2017 1116   PHURINE 5.0 11/06/2017 1116   GLUCOSEU NEGATIVE 11/06/2017 1116   GLUCOSEU NEGATIVE 01/25/2010 0858   HGBUR NEGATIVE 11/06/2017 1116   HGBUR 1+ 12/30/2008 0838   BILIRUBINUR NEGATIVE 11/06/2017 1116   BILIRUBINUR Neg 04/28/2015 1122   [place] 11/06/2017 1116   PROTEINUR NEGATIVE 11/06/2017 1116   UROBILINOGEN 0.2 04/28/2015 1122   UROBILINOGEN 0.2 01/25/2010 0858   NITRITE NEGATIVE 11/06/2017 1116   LEUKOCYTESUR SMALL (A) 11/06/2017 1116     STUDIES: Scheduled for scans tomorrow  ELIGIBLE FOR AVAILABLE RESEARCH PROTOCOL: no  ASSESSMENT: 72 y.o. [place], [place] woman status post right breast overlapping sites biopsy 2 and lymph node biopsy 10/07/2016 all positive for an invasive ductal carcinoma, grade 2, estrogen and progesterone receptor positive, HER-2 nonamplified, with an MIB-1 between 15 and 20%.--This is clinical stage IIIB  (a) breast MRI suggests left-sided disease, biopsy 11/18/2016 shows atypical ductal hyperplasia  (1) genetics testing 11/19/2016--Genetic counseling and testing for hereditary cancer syndromes performed on 11/19/2016. Results are negative for pathogenic mutations in 46 genes analyzed by [facility]'s Common Hereditary Cancers Panel. Results are dated 11/26/2016. Genes tested: APC, ATM, AXIN2, BARD1, BMPR1A, BRCA1, BRCA2, BRIP1, CDH1, CDKN2A, CHEK2, CTNNA1, DICER1, EPCAM, GREM1, HOXB13, KIT, MEN1, MLH1, MSH2, MSH3, MSH6, MUTYH, NBN, NF1, NTHL1, PALB2, PDGFRA, PMS2, POLD1, POLE, PTEN, RAD50, RAD51C, RAD51D, SDHA, SDHB, SDHC, SDHD, SMAD4, SMARCA4, STK11, TP53, TSC1, TSC2, and VHL.  (a) A variant of uncertain significance (not clinically actionable) was noted in CTNNA1.  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 There was a 4 cm right breast mass with right axillary lymph nodes and evidence of cirrhosis.  (3) neoadjuvant treatment to consist of cyclophosphamide, methotrexate, and fluorouracil (CMF) chemotherapy every 21 days 8, starting 11/05/2016  (4) Bilateral mastectomies on 04/04/2017 left breast: DCIS, 1.2 cm, margins negative, right Breast: IMC predominantly lobular, grade 2, 8 cm, Posterior margin focally positive, 10/10 lymph noes involved.  Both are ER/PR positive, invasive tumor was HER-2 negative (ratio 1.48).  (a) the patient is interested in eventual reconstruction  (5) adjuvant radiation from 06/02/2017-07/10/2017: 1) Right chest wall/ 50.4 Gy in 28 fractions.  2) L-spine/ 30 Gy in 10 fractions  (6) Anastrozole started 09/2017  (a) bone density 12/18/2017 showed a T score of -0.7 normal (although scan quality was limited and lumbar spine was not utilized due to advanced degenerative changes. )  (7) Xgeva restarted on 11/28/17, to be administered every 12 weeks.    PLAN: I spent approximately 30 minutes face to face with Jasmine Hood with more than 50% of that time spent in counseling and coordination of care. Jasmine Hood is now a little over a year out from definitive diagnosis of metastatic breast cancer.  Her functional status is very stable.  She has primarily bone disease and patients in that situation can generally do quite well for many years with out resorting to chemotherapy  Jasmine Hood is tolerating the anastrozole and the denosumab/Xgeva with no significant side effects.  She is scheduled for restaging studies tomorrow.  Assuming there has been no evidence of progression the plan is to continue.  The discomfort in her right hand is likely due to carpal tunnel and I suggested that she get a wrist splint and see if that resolves those symptoms.  She is very interested  in reconstruction.  I do not know if this will be possible but I am making a referral to Dr.Diillingham to discuss that possibility.  Jasmine Hood is planning to name her younger daughter as her healthcare power of attorney but could not tell me the younger daughter's phone number today.  We will continue to try to clarify that in subsequent visits  I will call her with results of tomorrow's scans  She knows to call for any other issues that may develop before the next visit, which will be the second week in January.  She will continue to receive Xgeva at that time    Chauncey Cruel, MD  02/19/18 11:15 AM Medical Oncology and Hematology [facility] [address] [place], [place] [address] Tel. [phone]    Fax. [phone]  Alice Rieger, am acting as scribe for Chauncey Cruel MD.  I, Lurline Del MD, have reviewed the above documentation for accuracy and completeness, and I agree with the above.

## 2018-02-19 ENCOUNTER — Encounter: Payer: Self-pay | Admitting: Oncology

## 2018-02-19 ENCOUNTER — Inpatient Hospital Stay: Payer: Medicare Other | Attending: Oncology

## 2018-02-19 ENCOUNTER — Telehealth: Payer: Self-pay | Admitting: Oncology

## 2018-02-19 ENCOUNTER — Inpatient Hospital Stay (HOSPITAL_BASED_OUTPATIENT_CLINIC_OR_DEPARTMENT_OTHER): Payer: Medicare Other | Admitting: Oncology

## 2018-02-19 ENCOUNTER — Inpatient Hospital Stay: Payer: Medicare Other

## 2018-02-19 VITALS — BP 129/86 | HR 84 | Temp 98.6°F | Resp 18 | Ht 59.0 in | Wt 203.3 lb

## 2018-02-19 VITALS — BP 147/82 | HR 85 | Temp 98.0°F | Resp 18

## 2018-02-19 DIAGNOSIS — Z6841 Body Mass Index (BMI) 40.0 and over, adult: Secondary | ICD-10-CM

## 2018-02-19 DIAGNOSIS — C50811 Malignant neoplasm of overlapping sites of right female breast: Secondary | ICD-10-CM

## 2018-02-19 DIAGNOSIS — C7951 Secondary malignant neoplasm of bone: Secondary | ICD-10-CM | POA: Diagnosis not present

## 2018-02-19 DIAGNOSIS — K7469 Other cirrhosis of liver: Secondary | ICD-10-CM

## 2018-02-19 DIAGNOSIS — Z170 Estrogen receptor positive status [ER+]: Secondary | ICD-10-CM

## 2018-02-19 DIAGNOSIS — Z95828 Presence of other vascular implants and grafts: Secondary | ICD-10-CM

## 2018-02-19 DIAGNOSIS — Z452 Encounter for adjustment and management of vascular access device: Secondary | ICD-10-CM | POA: Insufficient documentation

## 2018-02-19 DIAGNOSIS — Z79811 Long term (current) use of aromatase inhibitors: Secondary | ICD-10-CM | POA: Insufficient documentation

## 2018-02-19 LAB — CBC WITH DIFFERENTIAL/PLATELET
Basophils Absolute: 0 10*3/uL (ref 0.0–0.1)
Basophils Relative: 1 %
EOS ABS: 0.1 10*3/uL (ref 0.0–0.5)
Eosinophils Relative: 4 %
HCT: 31.4 % — ABNORMAL LOW (ref 34.8–46.6)
HEMOGLOBIN: 10.2 g/dL — AB (ref 11.6–15.9)
LYMPHS PCT: 18 %
Lymphs Abs: 0.7 10*3/uL — ABNORMAL LOW (ref 0.9–3.3)
MCH: 31.5 pg (ref 25.1–34.0)
MCHC: 32.4 g/dL (ref 31.5–36.0)
MCV: 97.1 fL (ref 79.5–101.0)
MONOS PCT: 7 %
Monocytes Absolute: 0.3 10*3/uL (ref 0.1–0.9)
NEUTROS PCT: 70 %
Neutro Abs: 2.6 10*3/uL (ref 1.5–6.5)
Platelets: 168 10*3/uL (ref 145–400)
RBC: 3.23 MIL/uL — AB (ref 3.70–5.45)
RDW: 18.2 % — ABNORMAL HIGH (ref 11.2–14.5)
WBC: 3.7 10*3/uL — ABNORMAL LOW (ref 3.9–10.3)

## 2018-02-19 LAB — COMPREHENSIVE METABOLIC PANEL
ALK PHOS: 125 U/L (ref 38–126)
ALT: 20 U/L (ref 0–44)
AST: 36 U/L (ref 15–41)
Albumin: 2.8 g/dL — ABNORMAL LOW (ref 3.5–5.0)
Anion gap: 8 (ref 5–15)
BUN: 10 mg/dL (ref 8–23)
CALCIUM: 8.7 mg/dL — AB (ref 8.9–10.3)
CO2: 25 mmol/L (ref 22–32)
CREATININE: 0.59 mg/dL (ref 0.44–1.00)
Chloride: 108 mmol/L (ref 98–111)
GFR calc non Af Amer: >60 mL/min (ref >60)
GLUCOSE: 112 mg/dL — AB (ref 70–99)
Potassium: 4 mmol/L (ref 3.5–5.1)
Sodium: 141 mmol/L (ref 135–145)
Total Bilirubin: 0.3 mg/dL (ref 0.3–1.2)
Total Protein: 6.5 g/dL (ref 6.5–8.1)

## 2018-02-19 MED ORDER — DENOSUMAB 120 MG/1.7ML ~~LOC~~ SOLN
SUBCUTANEOUS | Status: AC
  Filled 2018-02-19: qty 1.7

## 2018-02-19 MED ORDER — DENOSUMAB 120 MG/1.7ML ~~LOC~~ SOLN
120.0000 mg | Freq: Once | SUBCUTANEOUS | Status: AC
  Administered 2018-02-19: 120 mg via SUBCUTANEOUS

## 2018-02-19 MED ORDER — HEPARIN SOD (PORK) LOCK FLUSH 100 UNIT/ML IV SOLN
500.0000 [IU] | Freq: Once | INTRAVENOUS | Status: AC
  Administered 2018-02-19: 500 [IU] via INTRAVENOUS
  Filled 2018-02-19: qty 5

## 2018-02-19 MED ORDER — SODIUM CHLORIDE 0.9 % IJ SOLN
10.0000 mL | Freq: Once | INTRAMUSCULAR | Status: AC
  Administered 2018-02-19: 10 mL via INTRAVENOUS
  Filled 2018-02-19: qty 10

## 2018-02-19 NOTE — Telephone Encounter (Signed)
Gave patient avs report and appointments for December and January. Referral to [facility] plastic surgeon specialists has already been acted upon and entered into [facility]. Patient aware she will be contacted re plastic surgery appointments.

## 2018-02-19 NOTE — Addendum Note (Signed)
Addended by: Chauncey Cruel on: 02/19/2018 12:22 PM   Modules accepted: Orders

## 2018-02-19 NOTE — Patient Instructions (Signed)

## 2018-02-20 ENCOUNTER — Ambulatory Visit (HOSPITAL_COMMUNITY): Admission: RE | Admit: 2018-02-20 | Payer: Medicare Other | Source: Ambulatory Visit

## 2018-02-20 ENCOUNTER — Ambulatory Visit (HOSPITAL_COMMUNITY): Payer: Medicare Other

## 2018-03-05 ENCOUNTER — Other Ambulatory Visit: Payer: Self-pay | Admitting: Oncology

## 2018-03-05 DIAGNOSIS — Z170 Estrogen receptor positive status [ER+]: Principal | ICD-10-CM

## 2018-03-05 DIAGNOSIS — C50811 Malignant neoplasm of overlapping sites of right female breast: Secondary | ICD-10-CM

## 2018-04-23 ENCOUNTER — Inpatient Hospital Stay: Payer: Medicare Other | Attending: Oncology

## 2018-04-23 DIAGNOSIS — Z95828 Presence of other vascular implants and grafts: Secondary | ICD-10-CM

## 2018-04-23 DIAGNOSIS — C50811 Malignant neoplasm of overlapping sites of right female breast: Secondary | ICD-10-CM | POA: Insufficient documentation

## 2018-04-23 DIAGNOSIS — Z452 Encounter for adjustment and management of vascular access device: Secondary | ICD-10-CM | POA: Insufficient documentation

## 2018-04-23 MED ORDER — HEPARIN SOD (PORK) LOCK FLUSH 100 UNIT/ML IV SOLN
500.0000 [IU] | Freq: Once | INTRAVENOUS | Status: AC
  Administered 2018-04-23: 500 [IU] via INTRAVENOUS
  Filled 2018-04-23: qty 5

## 2018-04-23 MED ORDER — SODIUM CHLORIDE 0.9% FLUSH
10.0000 mL | INTRAVENOUS | Status: DC | PRN
  Administered 2018-04-23: 10 mL via INTRAVENOUS
  Filled 2018-04-23: qty 10

## 2018-05-21 ENCOUNTER — Inpatient Hospital Stay: Payer: Medicare Other

## 2018-05-21 ENCOUNTER — Inpatient Hospital Stay: Payer: Medicare Other | Attending: Oncology | Admitting: Oncology

## 2018-05-21 ENCOUNTER — Ambulatory Visit (HOSPITAL_COMMUNITY)
Admission: RE | Admit: 2018-05-21 | Discharge: 2018-05-21 | Disposition: A | Discharge status: Home / Self Care | Payer: Medicare Other | Source: Ambulatory Visit | Attending: Oncology | Admitting: Oncology

## 2018-05-21 ENCOUNTER — Encounter: Payer: Self-pay | Admitting: Oncology

## 2018-05-21 ENCOUNTER — Telehealth: Payer: Self-pay | Admitting: Oncology

## 2018-05-21 DIAGNOSIS — C7951 Secondary malignant neoplasm of bone: Secondary | ICD-10-CM | POA: Insufficient documentation

## 2018-05-21 DIAGNOSIS — Z170 Estrogen receptor positive status [ER+]: Secondary | ICD-10-CM | POA: Insufficient documentation

## 2018-05-21 DIAGNOSIS — Z452 Encounter for adjustment and management of vascular access device: Secondary | ICD-10-CM | POA: Insufficient documentation

## 2018-05-21 DIAGNOSIS — Z95828 Presence of other vascular implants and grafts: Secondary | ICD-10-CM

## 2018-05-21 DIAGNOSIS — C50811 Malignant neoplasm of overlapping sites of right female breast: Secondary | ICD-10-CM | POA: Insufficient documentation

## 2018-05-21 DIAGNOSIS — Z79811 Long term (current) use of aromatase inhibitors: Secondary | ICD-10-CM | POA: Diagnosis not present

## 2018-05-21 DIAGNOSIS — Z6841 Body Mass Index (BMI) 40.0 and over, adult: Secondary | ICD-10-CM

## 2018-05-21 DIAGNOSIS — K7469 Other cirrhosis of liver: Secondary | ICD-10-CM

## 2018-05-21 DIAGNOSIS — R05 Cough: Secondary | ICD-10-CM | POA: Diagnosis not present

## 2018-05-21 LAB — COMPREHENSIVE METABOLIC PANEL
ALT: 48 U/L — ABNORMAL HIGH (ref 0–44)
AST: 72 U/L — ABNORMAL HIGH (ref 15–41)
Albumin: 3 g/dL — ABNORMAL LOW (ref 3.5–5.0)
Alkaline Phosphatase: 108 U/L (ref 38–126)
Anion gap: 10 (ref 5–15)
BUN: 11 mg/dL (ref 8–23)
CHLORIDE: 106 mmol/L (ref 98–111)
CO2: 21 mmol/L — ABNORMAL LOW (ref 22–32)
Calcium: 8.5 mg/dL — ABNORMAL LOW (ref 8.9–10.3)
Creatinine, Ser: 0.65 mg/dL (ref 0.44–1.00)
GFR calc Af Amer: >60 mL/min (ref >60)
GFR calc non Af Amer: >60 mL/min (ref >60)
Glucose, Bld: 126 mg/dL — ABNORMAL HIGH (ref 70–99)
Potassium: 4.2 mmol/L (ref 3.5–5.1)
SODIUM: 137 mmol/L (ref 135–145)
Total Bilirubin: 0.4 mg/dL (ref 0.3–1.2)
Total Protein: 7 g/dL (ref 6.5–8.1)

## 2018-05-21 LAB — CBC WITH DIFFERENTIAL/PLATELET
Abs Immature Granulocytes: 0 10*3/uL (ref 0.00–0.07)
BASOS PCT: 0 %
Basophils Absolute: 0 10*3/uL (ref 0.0–0.1)
Eosinophils Absolute: 0.1 10*3/uL (ref 0.0–0.5)
Eosinophils Relative: 2 %
HCT: 36 % (ref 36.0–46.0)
Hemoglobin: 11.5 g/dL — ABNORMAL LOW (ref 12.0–15.0)
Immature Granulocytes: 0 %
Lymphocytes Relative: 33 %
Lymphs Abs: 1 10*3/uL (ref 0.7–4.0)
MCH: 30.8 pg (ref 26.0–34.0)
MCHC: 31.9 g/dL (ref 30.0–36.0)
MCV: 96.5 fL (ref 80.0–100.0)
Monocytes Absolute: 0.3 10*3/uL (ref 0.1–1.0)
Monocytes Relative: 10 %
NRBC: 0 % (ref 0.0–0.2)
Neutro Abs: 1.7 10*3/uL (ref 1.7–7.7)
Neutrophils Relative %: 55 %
Platelets: 134 10*3/uL — ABNORMAL LOW (ref 150–400)
RBC: 3.73 MIL/uL — AB (ref 3.87–5.11)
RDW: 15.4 % (ref 11.5–15.5)
WBC: 3.1 10*3/uL — AB (ref 4.0–10.5)

## 2018-05-21 MED ORDER — DENOSUMAB 120 MG/1.7ML ~~LOC~~ SOLN
SUBCUTANEOUS | Status: AC
  Filled 2018-05-21: qty 1.7

## 2018-05-21 MED ORDER — HEPARIN SOD (PORK) LOCK FLUSH 100 UNIT/ML IV SOLN
500.0000 [IU] | Freq: Once | INTRAVENOUS | Status: AC
  Administered 2018-05-21: 500 [IU] via INTRAVENOUS
  Filled 2018-05-21: qty 5

## 2018-05-21 MED ORDER — SODIUM CHLORIDE 0.9% FLUSH
10.0000 mL | INTRAVENOUS | Status: DC | PRN
  Administered 2018-05-21: 10 mL via INTRAVENOUS
  Filled 2018-05-21: qty 10

## 2018-05-21 MED ORDER — CALCIUM CARBONATE ANTACID 500 MG PO CHEW: 1.0000 | CHEWABLE_TABLET | Freq: Two times a day (BID) | ORAL | 4 refills | Status: DC

## 2018-05-21 MED ORDER — DENOSUMAB 120 MG/1.7ML ~~LOC~~ SOLN
120.0000 mg | Freq: Once | SUBCUTANEOUS | Status: AC
  Administered 2018-05-21: 120 mg via SUBCUTANEOUS

## 2018-05-21 MED ORDER — AZITHROMYCIN 250 MG PO TABS: ORAL_TABLET | ORAL | 0 refills | Status: DC

## 2018-05-21 MED ORDER — ANASTROZOLE 1 MG PO TABS: 1.0000 mg | ORAL_TABLET | Freq: Every day | ORAL | 3 refills | Status: DC

## 2018-05-21 NOTE — Progress Notes (Signed)
Pt Calcium is 8.5 today Dr. [name] wants to give xgeva today and wants Pt to take TUMS tablets for the next three days.

## 2018-05-21 NOTE — Patient Instructions (Signed)

## 2018-05-21 NOTE — Telephone Encounter (Signed)
Gave avs and calendar ° °

## 2018-05-21 NOTE — Progress Notes (Signed)
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 Kieana continues on anastrozole, with good tolerance. She denies issues with hot flashes or vaginal dryness. She only pays a few dollars for it.  She also receives denosumab/Xgeva, with no side effects that she is aware of.  She was scheduled for a chest CT and whole body bone scan, but she has not had these completed.  She also was supposed to have met with Dr. Elisabeth Cara to discuss reconstruction.  The problem is that the patient takes her phone off the hook or simply does not answer the phone so it has been difficult to contact her.   REVIEW OF SYSTEMS: Achsah reports not feeling well today. She reports a cold, with vomiting, cough, fever, right arm swelling, dark urine, headaches behind her eyes. She drinks a bottle of water a day. She states her diarrhea has resolved. The patient denies visual changes, nausea, stiff neck, dizziness, or gait imbalance. There has been no phlegm production, pleurisy, no chest pain or pressure, and no change in bowel or bladder habits. The patient denies rash, bleeding, unexplained fatigue or unexplained weight loss. A detailed review of systems was otherwise entirely negative.   BREAST CANCER HISTORY: From the original intake note  Levia herself noted a  change in her right breast sometime in September 2017. She did not immediately bring it to medical attention. When she noted some significant changes in her right nipple she saw [name] and was set up for bilateral diagnostic mammography with tomography and [facility] breast [facility]. This found the breast density to be category B. In the upper and lower outer quadrants of the right breast there was a mass measuring at least 10 cm. There were also groups of heterogeneous calcifications measuring 3 cm and a separate group 0.4 cm. On physical exam there was a palpable firm mass measuring approximately 10 cm involving the upper outer and lower outer quadrant of the right breast, with skin reaction and nipple retraction. There was no palpable right axillary adenopathy.  Right breast ultrasonography confirmed a large hypoechoic mass with irregular margins extending to the skin surface. The right axilla showed 2 lymph nodes which appeared abnormal.  In the left breast there were some indeterminate retroareolar calcifications which were felt to warrant biopsy. This was performed 10/08/2016 and showed a fibroadenoma ([id number]).  Biopsy of the right breast mass at the 7:00 and 10:00 positions as well as one of the suspicious lymph nodes all showed invasive ductal carcinoma, E-cadherin positive. Separate prognostic profiles from the 2 breast masses were sent. Estrogen receptor was positive at 95-70%, progesterone receptor was positive at 15-85%, all with strong staining intensity, the MIB-1 was 15-20%, and HER-2 was nonamplified, the signals ratio being 1.28-1.72 and the number per cell 1.85-3.95.  The patient's subsequent history is as detailed below   PAST MEDICAL HISTORY: Past Medical  History:  Diagnosis Date  . Breast cancer ([facility]) 10/07/2016   right breast, spine  . Brown recluse spider bite    right leg  . COLONIC POLYPS, HX OF 01/05/2009   Qualifier:  Diagnosis of  By: [name]  MD, [name]   . Complication of anesthesia    slow to awaken after wisdom  teeth extraction age 86  . Constipation   . Genetic testing 11/27/2016   Ms. Ayub underwent genetic counseling and testing for hereditary cancer syndromes on 11/19/2016. Her results were negative for mutations in all 46 genes analyzed by [facility]'s 46-gene Common Hereditary Cancers Panel. Genes analyzed include: APC, ATM, AXIN2, BARD1, BMPR1A, BRCA1, BRCA2, BRIP1, CDH1, CDKN2A, CHEK2, CTNNA1, DICER1, EPCAM, GREM1, HOXB13, KIT, MEN1, MLH1, MSH2, MSH3, MSH6, MUTYH, NBN,  . HYPOTHYROIDISM 02/01/2010   Qualifier: Diagnosis of  By: [name]  MD, [name]   . Morbid obesity ([facility]) 01/05/2009   Qualifier: Diagnosis of  By: [name]  MD, [name]   . Pneumonia 1987  . Sleep apnea    no cpap used   . Stroke [facility])    found on CT scan    PAST SURGICAL HISTORY: Past Surgical History:  Procedure Laterality Date  . BILATERAL TOTAL MASTECTOMY WITH AXILLARY LYMPH NODE DISSECTION Bilateral 04/04/2017   Procedure: RIGHT MASTECTOMY WITH TARGETED RIGHT AXILLARY NODE DISSECTION AND LEFT PROPHYLATIC MASTECTOMY ERAS PATHWAY;  Surgeon: Alphonsa Overall, MD;  Location: [place] [facility];  Service: General;  Laterality: Bilateral;  PEC BLOCK  . BREAST BIOPSY Right 10/07/2016   invasive mammary carcinoma  . BREAST BIOPSY Left 10/08/2016   breast fibroadenoma no malignancy  . CATARACT EXTRACTION Bilateral   . COLONOSCOPY W/ BIOPSIES AND POLYPECTOMY  2012  . COLONOSCOPY WITH PROPOFOL N/A 12/22/2015   Procedure: COLONOSCOPY WITH PROPOFOL;  Surgeon: [name], MD;  [facility];  Service: Endoscopy;  Laterality: N/A;  . INCISION AND DRAINAGE Right 1998   surgery for spider bite on RLE; "[name]"  . MASTECTOMY Left 04/04/2017   [name]  . MASTECTOMY WITH AXILLARY LYMPH NODE DISSECTION Right 04/04/2017  . MOLE REMOVAL  10/29/2016   Procedure: MOLE REMOVAL LEFT CHEST;  Surgeon: Alphonsa Overall, MD;   [facility];  Service: General;;  . PORTACATH PLACEMENT N/A 10/29/2016   Procedure: INSERTION PORT-A-CATH;  Surgeon: Alphonsa Overall, MD;  [facility];  Service: General;  Laterality: N/A;  . TUBAL LIGATION    . WISDOM TOOTH EXTRACTION  age 54    FAMILY HISTORY Family History  Problem Relation Age of Onset  . Breast cancer Mother 73       d.52 from metastatic disease  . Throat cancer Father        d.65 history of smoking  . Rheum arthritis Sister        3 sisters pos. for osteo and RA  . Diabetes Maternal Aunt   . Cancer Maternal Grandfather        mouth cancer-chewed tobacco  . Breast cancer Cousin 43  . Breast cancer Daughter 30  . Stomach cancer Sister 14  The patient's father died at the age of 43 from laryngeal cancer in the setting of tobacco abuse. The patient's mother died at the age of 43 from metastatic breast cancer which had been diagnosed in her early 29s. The patient has 2 half-sisters and one half-brother. One of her sisters has "stomach" cancer. There is also a cousin on the maternal side with breast cancer diagnosed at age  71. No family member has been genetically tested yet  GYNECOLOGIC HISTORY:  No LMP recorded. Patient is postmenopausal. Menarche age 23, first live birth age 38, she is Swisher P5. She is status post bilateral tubal ligation. She stopped having periods in 1985. She tells me she did not use hormone replacement. However a note from her bone density scan December 2001 states "this patient began hormone replacement therapy 3 months ago"  SOCIAL HISTORY:  She is originally from [place]. She worked for [employer], but she is now retired. She has survived 2 husbands. At home currently is just she and her daughter Tashima Scarpulla. She tells me this daughter had mild brain damage at birth and is not working, and cannot drive because of a history of seizures. However she is the one she is planning to name is her healthcare power of  attorney. The patient has 13 grandchildren and 4 great-grandchildren. She attends a [employer]: At the 10/21/2016 visit the patient was given the appropriate documents to complete and notarize at her discretion.  She intends to name her youngest duaghter Dot Lanes, lives in [place], as her healthcare power of attorney.    HEALTH MAINTENANCE: Social History   Tobacco Use  . Smoking status: Never Smoker  . Smokeless tobacco: Never Used  Substance Use Topics  . Alcohol use: No  . Drug use: No     Colonoscopy: 12/22/2015/[facility]  PAP:  Bone density: 12/18/2017 with a T score of -0.7   Allergies  Allergen Reactions  . Bee Venom Anaphylaxis    Current Outpatient Medications  Medication Sig Dispense Refill  . anastrozole (ARIMIDEX) 1 MG tablet Take 1 tablet (1 mg total) by mouth daily. 90 tablet 3  . azithromycin (ZITHROMAX) 250 MG tablet Take 2 tablets day 1, then one tablet dailly 6 each 0  . valACYclovir (VALTREX) 500 MG tablet TAKE 1 TABLET BY MOUTH EVERY DAY 30 tablet 0   No current facility-administered medications for this visit.     OBJECTIVE: Older white woman who had some dry cough during today's exam  Vitals:   05/21/18 1250  BP: 129/84  Pulse: 82  Resp: 18  Temp: 98.8 F (37.1 C)  SpO2: 94%     Body mass index is 42.05 kg/m.    ECOG FS:1 - Symptomatic but completely ambulatory   Sclerae unicteric, pupils round and equal Oropharynx slightly dry, otherwise clear No cervical or supraclavicular adenopathy Lungs no rales or rhonchi Heart regular rate and rhythm Abd soft, nontender, positive bowel sounds MSK no focal spinal tenderness, no upper extremity lymphedema or erythema Neuro: nonfocal, well oriented, appropriate affect Breasts: Status post bilateral mastectomies.  She also received radiation bilaterally.  There is no evidence of local recurrence.  Both axillae are benign  LAB RESULTS:  CMP     Component Value  Date/Time   NA 137 05/21/2018 1007   NA 140 03/21/2017 0918   K 4.2 05/21/2018 1007   K 3.7 03/21/2017 0918   CL 106 05/21/2018 1007   CO2 21 (L) 05/21/2018 1007   CO2 23 03/21/2017 0918   GLUCOSE 126 (H) 05/21/2018 1007   GLUCOSE 120 03/21/2017 0918   BUN 11 05/21/2018 1007   BUN 6.6 (L) 03/21/2017 0918   CREATININE 0.65 05/21/2018 1007   CREATININE 0.6 03/21/2017 0918   CALCIUM 8.5 (L) 05/21/2018 1007   CALCIUM 8.6 03/21/2017 0918   PROT 7.0 05/21/2018 1007   PROT 6.3 (L)  03/21/2017 0918   ALBUMIN 3.0 (L) 05/21/2018 1007   ALBUMIN 2.4 (L) 03/21/2017 0918   AST 72 (H) 05/21/2018 1007   AST 53 (H) 03/21/2017 0918   ALT 48 (H) 05/21/2018 1007   ALT 29 03/21/2017 0918   ALKPHOS 108 05/21/2018 1007   ALKPHOS 123 03/21/2017 0918   BILITOT 0.4 05/21/2018 1007   BILITOT 0.55 03/21/2017 0918   GFRNONAA >60 05/21/2018 1007   GFRAA >60 05/21/2018 1007    No results found for: TOTALPROTELP, ALBUMINELP, A1GS, A2GS, BETS, BETA2SER, GAMS, MSPIKE, SPEI  No results found for: KPAFRELGTCHN, LAMBDASER, [facility]  Lab Results  Component Value Date   WBC 3.1 (L) 05/21/2018   NEUTROABS 1.7 05/21/2018   HGB 11.5 (L) 05/21/2018   HCT 36.0 05/21/2018   MCV 96.5 05/21/2018   PLT 134 (L) 05/21/2018      Chemistry      Component Value Date/Time   NA 137 05/21/2018 1007   NA 140 03/21/2017 0918   K 4.2 05/21/2018 1007   K 3.7 03/21/2017 0918   CL 106 05/21/2018 1007   CO2 21 (L) 05/21/2018 1007   CO2 23 03/21/2017 0918   BUN 11 05/21/2018 1007   BUN 6.6 (L) 03/21/2017 0918   CREATININE 0.65 05/21/2018 1007   CREATININE 0.6 03/21/2017 0918      Component Value Date/Time   CALCIUM 8.5 (L) 05/21/2018 1007   CALCIUM 8.6 03/21/2017 0918   ALKPHOS 108 05/21/2018 1007   ALKPHOS 123 03/21/2017 0918   AST 72 (H) 05/21/2018 1007   AST 53 (H) 03/21/2017 0918   ALT 48 (H) 05/21/2018 1007   ALT 29 03/21/2017 0918   BILITOT 0.4 05/21/2018 1007   BILITOT 0.55 03/21/2017 0918        No results found for: LABCA2  No components found for: [id number]  No results for input(s): INR in the last 168 hours.  Urinalysis    Component Value Date/Time   COLORURINE YELLOW 11/06/2017 1116   APPEARANCEUR CLEAR 11/06/2017 1116   LABSPEC 1.020 11/06/2017 1116   PHURINE 5.0 11/06/2017 1116   GLUCOSEU NEGATIVE 11/06/2017 1116   GLUCOSEU NEGATIVE 01/25/2010 0858   HGBUR NEGATIVE 11/06/2017 1116   HGBUR 1+ 12/30/2008 0838   BILIRUBINUR NEGATIVE 11/06/2017 1116   BILIRUBINUR Neg 04/28/2015 1122   KETONESUR NEGATIVE 11/06/2017 1116   PROTEINUR NEGATIVE 11/06/2017 1116   UROBILINOGEN 0.2 04/28/2015 1122   UROBILINOGEN 0.2 01/25/2010 0858   NITRITE NEGATIVE 11/06/2017 1116   LEUKOCYTESUR SMALL (A) 11/06/2017 1116     STUDIES: Scans are pending  ELIGIBLE FOR AVAILABLE RESEARCH PROTOCOL: no  ASSESSMENT: 73 y.o. [place], [place] woman status post right breast overlapping sites biopsy 2 and lymph node biopsy 10/07/2016 all positive for an invasive ductal carcinoma, grade 2, estrogen and progesterone receptor positive, HER-2 nonamplified, with an MIB-1 between 15 and 20%.--This is clinical stage IIIB  (a) breast MRI suggests left-sided disease, biopsy 11/18/2016 shows atypical ductal hyperplasia  (1) genetics testing 11/19/2016--Genetic counseling and testing for hereditary cancer syndromes performed on 11/19/2016. Results are negative for pathogenic mutations in 46 genes analyzed by [facility]'s Common Hereditary Cancers Panel. Results are dated 11/26/2016. Genes tested: APC, ATM, AXIN2, BARD1, BMPR1A, BRCA1, BRCA2, BRIP1, CDH1, CDKN2A, CHEK2, CTNNA1, DICER1, EPCAM, GREM1, HOXB13, KIT, MEN1, MLH1, MSH2, MSH3, MSH6, MUTYH, NBN, NF1, NTHL1, PALB2, PDGFRA, PMS2, POLD1, POLE, PTEN, RAD50, RAD51C, RAD51D, SDHA, SDHB, SDHC, SDHD, SMAD4, SMARCA4, STK11, TP53, TSC1, TSC2, and VHL.  (a) A variant of uncertain significance (not clinically actionable)  was noted in [place].    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 There was a 4 cm right breast mass with right axillary lymph nodes and evidence of cirrhosis.  (3) neoadjuvant treatment to consist of cyclophosphamide, methotrexate, and fluorouracil (CMF) chemotherapy every 21 days 8, starting 11/05/2016  (4) Bilateral mastectomies on 04/04/2017 left breast: DCIS, 1.2 cm, margins negative, right Breast: IMC predominantly lobular, grade 2, 8 cm, Posterior margin focally positive, 10/10 lymph noes involved.  Both are ER/PR positive, invasive tumor was HER-2 negative (ratio 1.48).  (a) the patient is interested in eventual reconstruction  (5) adjuvant radiation from 06/02/2017-07/10/2017: 1) Right chest wall/ 50.4 Gy in 28 fractions.  2) L-spine/ 30 Gy in 10 fractions  (6) Anastrozole started 09/2017  (a) bone density 12/18/2017 showed a T score of -0.7 normal (although scan quality was limited and lumbar spine was not utilized due to advanced degenerative changes. )  (7) Xgeva restarted on 11/28/17, given monthly  PLAN: Genessa was supposed to have had some scans before today but since she does not answer the phone those could not be scheduled.  We had also referred her to Dr. Elisabeth Cara at the patient's request, but again that did not happen.  In fact she tells me her primary care physician has retired and that she does not know who her primary care physician will be.  Given all this we have changed the telephone in the contact person to that of her daughter who will be bringing her to future appointments.  As far as her "cold" is concerned since she reports fever we are starting her on Zithromax.  We are also obtaining a chest x-ray today.  We are reentering the orders for the CT of the chest and bone scan.  I am also writing another letter to Dr. Marla Roe to see if an appointment with her can be arranged.  She is now  receiving the Xgeva monthly.  Her calcium is a little low today but we are going to proceed with therapy.  She will be taking Tums over the next few days to replenish her calcium.  Otherwise Izela will return to see me in 4 weeks to discuss results of the above studies.  She knows to call us for any other issue that may develop before then.  Gaby Harney, Virgie Dad, MD  05/21/18 1:33 PM Medical Oncology and Hematology [facility] [address] [place], [place] [address] Tel. [phone]    Fax. [phone]  I, Wilburn Mylar am acting as scribe for Dr. Virgie Dad. Khalie Wince.  I, Lurline Del MD, have reviewed the above documentation for accuracy and completeness, and I agree with the above.

## 2018-06-05 ENCOUNTER — Encounter: Payer: Self-pay | Admitting: Plastic Surgery

## 2018-06-05 ENCOUNTER — Ambulatory Visit (INDEPENDENT_AMBULATORY_CARE_PROVIDER_SITE_OTHER): Payer: Medicare Other | Admitting: Plastic Surgery

## 2018-06-05 DIAGNOSIS — Z853 Personal history of malignant neoplasm of breast: Secondary | ICD-10-CM | POA: Insufficient documentation

## 2018-06-05 DIAGNOSIS — Z9013 Acquired absence of bilateral breasts and nipples: Secondary | ICD-10-CM

## 2018-06-05 NOTE — Progress Notes (Addendum)
Patient ID: Jasmine Hood, female    DOB: May 09, 1946, 73 y.o.   MRN: [id number]   Chief Complaint  Patient presents with  . Advice Only    for reconstruction    The patient is a 73 year old white female here for evaluation of her bilateral breasts.  She underwent bilateral mastectomies by Dr. Lucia Gaskins for estrogen positive right breast cancer.  She is on anastrozole and valacyclovir.  There have been some challenges getting her to the visit because she takes her phone off the home.  She is interested in removal of the excess tissue particularly at the medial aspect of both breasts and laterally in the axillary line.  She does not have an expectation for breast reconstruction with any kind of recreation of her breasts.  She has multiple medical conditions including: colon disease, morbid obesity, history of bone metastasis, cirrhosis of the liver and hypothyroidism.  On exam she does have excess soft tissue and skin in the medial aspect of both breasts with dogears.  This is also present to a more significant level laterally.  She did have radiation to the right side.  She thinks that the radiation ended in September 2019.  She is 4 feet 11 inches tall, weight is 208 pounds.  Preop bra 44 D.  She is aware of second to nature and has been there before.  She has a vacation planned with her son and her daughter to [place] in April.  Her Port-A-Cath is on the left side.  The incisions are well-healed from her previous surgery.   Review of Systems  Past Medical History:  Diagnosis Date  . Breast cancer ([facility]) 10/07/2016   right breast, spine  . Brown recluse spider bite    right leg  . COLONIC POLYPS, HX OF 01/05/2009   Qualifier: Diagnosis of  By: [name]  MD, [name]   . Complication of anesthesia    slow to awaken after wisdom  teeth extraction age 79  . Constipation   . Genetic testing 11/27/2016   Ms. Angevine underwent genetic counseling and testing for hereditary cancer syndromes on  11/19/2016. Her results were negative for mutations in all 46 genes analyzed by [facility]'s 46-gene Common Hereditary Cancers Panel. Genes analyzed include: APC, ATM, AXIN2, BARD1, BMPR1A, BRCA1, BRCA2, BRIP1, CDH1, CDKN2A, CHEK2, CTNNA1, DICER1, EPCAM, GREM1, HOXB13, KIT, MEN1, MLH1, MSH2, MSH3, MSH6, MUTYH, NBN,  . HYPOTHYROIDISM 02/01/2010   Qualifier: Diagnosis of  By: [name]  MD, [name]   . Morbid obesity ([place]) 01/05/2009   Qualifier: Diagnosis of  By: [name]  MD, [name]   . Pneumonia 1987  . Sleep apnea    no cpap used   . Stroke [facility])    found on CT scan    Past Surgical History:  Procedure Laterality Date  . BILATERAL TOTAL MASTECTOMY WITH AXILLARY LYMPH NODE DISSECTION Bilateral 04/04/2017   Procedure: RIGHT MASTECTOMY WITH TARGETED RIGHT AXILLARY NODE DISSECTION AND LEFT PROPHYLATIC MASTECTOMY ERAS PATHWAY;  Surgeon: Alphonsa Overall, MD;  Location: [facility];  Service: General;  Laterality: Bilateral;  PEC BLOCK  . BREAST BIOPSY Right 10/07/2016   invasive mammary carcinoma  . BREAST BIOPSY Left 10/08/2016   breast fibroadenoma no malignancy  . CATARACT EXTRACTION Bilateral   . COLONOSCOPY W/ BIOPSIES AND POLYPECTOMY  2012  . COLONOSCOPY WITH PROPOFOL N/A 12/22/2015   Procedure: COLONOSCOPY WITH PROPOFOL;  Surgeon: [name], MD;  [facility];  Service: Endoscopy;  Laterality: N/A;  .  INCISION AND DRAINAGE Right 1998   surgery for spider bite on RLE; "[name]"  . MASTECTOMY Left 04/04/2017   [name]  . MASTECTOMY WITH AXILLARY LYMPH NODE DISSECTION Right 04/04/2017  . MOLE REMOVAL  10/29/2016   Procedure: MOLE REMOVAL LEFT CHEST;  Surgeon: Alphonsa Overall, MD;  [facility];  Service: General;;  . PORTACATH PLACEMENT N/A 10/29/2016   Procedure: INSERTION PORT-A-CATH;  Surgeon: Alphonsa Overall, MD;  [facility];  Service: General;  Laterality: N/A;  . TUBAL LIGATION    . WISDOM TOOTH EXTRACTION  age 53      Current Outpatient Medications:   .  anastrozole (ARIMIDEX) 1 MG tablet, Take 1 tablet (1 mg total) by mouth daily., Disp: 90 tablet, Rfl: 3 .  calcium carbonate (TUMS) 500 MG chewable tablet, Chew 1 tablet (200 mg of elemental calcium total) by mouth 2 (two) times daily., Disp: 120 tablet, Rfl: 4   Objective:   Vitals:   06/05/18 0834  BP: (!) 169/101  Pulse: 77  Temp: 98.1 F (36.7 C)  SpO2: 97%    Physical Exam Vitals signs and nursing note reviewed.  Constitutional:      Appearance: Normal appearance.  HENT:     Head: Normocephalic and atraumatic.     Mouth/Throat:     Mouth: Mucous membranes are moist.  Eyes:     Extraocular Movements: Extraocular movements intact.  Neck:     Musculoskeletal: Normal range of motion.  Cardiovascular:     Rate and Rhythm: Normal rate.     Pulses: Normal pulses.  Abdominal:     General: Abdomen is flat. There is no distension.  Neurological:     General: No focal deficit present.     Mental Status: She is alert.  Psychiatric:        Mood and Affect: Mood normal.        Thought Content: Thought content normal.        Judgment: Judgment normal.     Assessment & Plan:  MORBID OBESITY  History of right breast cancer  Acquired absence of breast and nipple, bilateral We discussed options for breast reconstruction and she is not interested in having that done.  I think this is very reasonable her overall body habitus and health.  She would like to have the excess tissue removed so that she can fit into a bra better without the skin irritation. Recommend excision of excess breast tissue and liposuction.   [place], DO

## 2018-06-08 ENCOUNTER — Encounter (HOSPITAL_COMMUNITY)
Admission: RE | Admit: 2018-06-08 | Discharge: 2018-06-08 | Disposition: A | Discharge status: Home / Self Care | Payer: Medicare Other | Source: Ambulatory Visit | Attending: Adult Health | Admitting: Adult Health

## 2018-06-08 ENCOUNTER — Ambulatory Visit (HOSPITAL_COMMUNITY)
Admission: RE | Admit: 2018-06-08 | Discharge: 2018-06-08 | Disposition: A | Discharge status: Home / Self Care | Payer: Medicare Other | Source: Ambulatory Visit | Attending: Adult Health | Admitting: Adult Health

## 2018-06-08 ENCOUNTER — Encounter (HOSPITAL_COMMUNITY): Payer: Self-pay

## 2018-06-08 DIAGNOSIS — C50811 Malignant neoplasm of overlapping sites of right female breast: Secondary | ICD-10-CM | POA: Diagnosis not present

## 2018-06-08 DIAGNOSIS — C50911 Malignant neoplasm of unspecified site of right female breast: Secondary | ICD-10-CM | POA: Diagnosis not present

## 2018-06-08 DIAGNOSIS — Z170 Estrogen receptor positive status [ER+]: Secondary | ICD-10-CM | POA: Diagnosis not present

## 2018-06-08 DIAGNOSIS — C801 Malignant (primary) neoplasm, unspecified: Secondary | ICD-10-CM | POA: Diagnosis not present

## 2018-06-08 DIAGNOSIS — C7951 Secondary malignant neoplasm of bone: Secondary | ICD-10-CM | POA: Diagnosis not present

## 2018-06-08 MED ORDER — HEPARIN SOD (PORK) LOCK FLUSH 100 UNIT/ML IV SOLN
INTRAVENOUS | Status: AC
  Filled 2018-06-08: qty 5

## 2018-06-08 MED ORDER — SODIUM CHLORIDE (PF) 0.9 % IJ SOLN
INTRAMUSCULAR | Status: AC
  Filled 2018-06-08: qty 50

## 2018-06-08 MED ORDER — TECHNETIUM TC 99M MEDRONATE IV KIT
20.6000 | PACK | Freq: Once | INTRAVENOUS | Status: AC | PRN
  Administered 2018-06-08: 20.6 via INTRAVENOUS

## 2018-06-08 MED ORDER — IOHEXOL 300 MG/ML  SOLN
75.0000 mL | Freq: Once | INTRAMUSCULAR | Status: AC | PRN
  Administered 2018-06-08: 75 mL via INTRAVENOUS

## 2018-06-08 MED ORDER — HEPARIN SOD (PORK) LOCK FLUSH 100 UNIT/ML IV SOLN
500.0000 [IU] | Freq: Once | INTRAVENOUS | Status: AC
  Administered 2018-06-08: 500 [IU] via INTRAVENOUS

## 2018-06-10 ENCOUNTER — Telehealth: Payer: Self-pay

## 2018-06-10 NOTE — Telephone Encounter (Signed)
Author phoned pt. To set up subsequent AWV and see if she wanted to set up another PCP. No answer, generic VM, not authorized to leave VM. Will re-attempt contact.

## 2018-06-17 NOTE — Progress Notes (Signed)
[place]  [phone] [phone]     ID: Jasmine Hood DOB: 1946/02/08  MR#: [id number]  IWO#:[id number]  Patient Care Team: Marletta Lor, MD as PCP - General Zaydan Papesh, Virgie Dad, MD as Consulting Physician (Oncology) Alphonsa Overall, MD as Consulting Physician (General Surgery) Kyung Rudd, MD as Consulting Physician (Radiation Oncology) Rockwell Germany, RN as Registered Nurse OTHER MD:  CHIEF COMPLAINT:  estrogen receptor positive breast cancer  CURRENT TREATMENT: Anastrozole; denosumab/Xgeva  INTERVAL HISTORY: Jasmine Hood is here today for follow up of her estrogen positive breast cancer accompanied by her sister. Jasmine Hood continues on anastrozole, with good tolerance. She denies issues with hot flashes or vaginal dryness. She only pays up to $8 for it.  She also receives denosumab/Xgeva, with no side effects that she is aware of.   Since her last visit, she underwent chest CT scan on 06/08/2018. This revealed: regression of previously noted mediastinal lymphadenopathy suggesting benign etiology on prior study; widespread metastatic disease to bones redemonstrated, increasingly sclerotic, likely to reflect healed metastases; no pulmonary disease.   She also underwent bone scan on 06/08/2018, with results showing: likely osseous metastatic disease in calvarium, manubrium, and proximal left humerus; no new sites to suggest progressive osseous metastatic disease.  She also met with Dr. Marla Roe on 06/05/2018, who recommended escape excision of excess breast tissue and liposuction.   REVIEW OF SYSTEMS: Shaylie reports feeling better since her last visit. She states her interval history has been uneventful. She notes taking tylenol for back pain. The patient denies unusual headaches, visual changes, nausea, vomiting, stiff neck, dizziness, or gait imbalance. There has been no cough, phlegm production, or pleurisy, no chest pain or pressure, and no change  in bowel or bladder habits. The patient denies fever, rash, bleeding, unexplained fatigue or unexplained weight loss. A detailed review of systems was otherwise entirely negative.   BREAST CANCER HISTORY: From the original intake note  Jasmine Hood herself noted a change in her right breast sometime in September 2017. She did not immediately bring it to medical attention. When she noted some significant changes in her right nipple she saw [name] and was set up for bilateral diagnostic mammography with tomography and [facility]. This found the breast density to be category B. In the upper and lower outer quadrants of the right breast there was a mass measuring at least 10 cm. There were also groups of heterogeneous calcifications measuring 3 cm and a separate group 0.4 cm. On physical exam there was a palpable firm mass measuring approximately 10 cm involving the upper outer and lower outer quadrant of the right breast, with skin reaction and nipple retraction. There was no palpable right axillary adenopathy.  Right breast ultrasonography confirmed a large hypoechoic mass with irregular margins extending to the skin surface. The right axilla showed 2 lymph nodes which appeared abnormal.  In the left breast there were some indeterminate retroareolar calcifications which were felt to warrant biopsy. This was performed 10/08/2016 and showed a fibroadenoma ([id number]).  Biopsy of the right breast mass at the 7:00 and 10:00 positions as well as one of the suspicious lymph nodes all showed invasive ductal carcinoma, E-cadherin positive. Separate prognostic profiles from the 2 breast masses were sent. Estrogen receptor was positive at 95-70%, progesterone receptor was positive at 15-85%, all with strong staining intensity, the MIB-1 was 15-20%, and HER-2 was nonamplified, the signals ratio being 1.28-1.72 and the number per  cell 1.85-3.95.  The patient's  subsequent history is as detailed below   PAST MEDICAL HISTORY: Past Medical History:  Diagnosis Date  . Breast cancer ([place]) 10/07/2016   right breast, spine  . Brown recluse spider bite    right leg  . COLONIC POLYPS, HX OF 01/05/2009   Qualifier: Diagnosis of  By: [name]  MD, [name]   . Complication of anesthesia    slow to awaken after wisdom  teeth extraction age 73  . Constipation   . Genetic testing 11/27/2016   Ms. Romagnoli underwent genetic counseling and testing for hereditary cancer syndromes on 11/19/2016. Her results were negative for mutations in all 46 genes analyzed by [facility]'s 46-gene Common Hereditary Cancers Panel. Genes analyzed include: APC, ATM, AXIN2, BARD1, BMPR1A, BRCA1, BRCA2, BRIP1, CDH1, CDKN2A, CHEK2, CTNNA1, DICER1, EPCAM, GREM1, HOXB13, KIT, MEN1, MLH1, MSH2, MSH3, MSH6, MUTYH, NBN,  . HYPOTHYROIDISM 02/01/2010   Qualifier: Diagnosis of  By: [name]  MD, [name]   . Morbid obesity ([place]) 01/05/2009   Qualifier: Diagnosis of  By: [name]  MD, [name]   . Pneumonia 1987  . Sleep apnea    no cpap used   . Stroke [facility])    found on CT scan    PAST SURGICAL HISTORY: Past Surgical History:  Procedure Laterality Date  . BILATERAL TOTAL MASTECTOMY WITH AXILLARY LYMPH NODE DISSECTION Bilateral 04/04/2017   Procedure: RIGHT MASTECTOMY WITH TARGETED RIGHT AXILLARY NODE DISSECTION AND LEFT PROPHYLATIC MASTECTOMY ERAS PATHWAY;  Surgeon: Alphonsa Overall, MD;  Location: [facility];  Service: General;  Laterality: Bilateral;  PEC BLOCK  . BREAST BIOPSY Right 10/07/2016   invasive mammary carcinoma  . BREAST BIOPSY Left 10/08/2016   breast fibroadenoma no malignancy  . CATARACT EXTRACTION Bilateral   . COLONOSCOPY W/ BIOPSIES AND POLYPECTOMY  2012  . COLONOSCOPY WITH PROPOFOL N/A 12/22/2015   Procedure: COLONOSCOPY WITH PROPOFOL;  Surgeon: [name], MD;  Location: [facility];  Service: Endoscopy;  Laterality: N/A;  . INCISION AND DRAINAGE Right 1998    surgery for spider bite on RLE; "[name]"  . MASTECTOMY Left 04/04/2017   [name]  . MASTECTOMY WITH AXILLARY LYMPH NODE DISSECTION Right 04/04/2017  . MOLE REMOVAL  10/29/2016   Procedure: MOLE REMOVAL LEFT CHEST;  Surgeon: Alphonsa Overall, MD;  Location: [facility];  Service: General;;  . PORTACATH PLACEMENT N/A 10/29/2016   Procedure: INSERTION PORT-A-CATH;  Surgeon: Alphonsa Overall, MD;  Location: [facility];  Service: General;  Laterality: N/A;  . TUBAL LIGATION    . WISDOM TOOTH EXTRACTION  age 31    FAMILY HISTORY Family History  Problem Relation Age of Onset  . Breast cancer Mother 25       d.52 from metastatic disease  . Throat cancer Father        d.65 history of smoking  . Rheum arthritis Sister        3 sisters pos. for osteo and RA  . Diabetes Maternal Aunt   . Cancer Maternal Grandfather        mouth cancer-chewed tobacco  . Breast cancer Cousin 12  . Breast cancer Daughter 83  . Stomach cancer Sister 62  The patient's father died at the age of 73 from laryngeal cancer in the setting of tobacco abuse. The patient's mother died at the age of 23 from metastatic breast cancer which had been diagnosed in her early 25s. The patient has 2 half-sisters and one half-brother. One of her sisters  has "stomach" cancer. There is also a cousin on the maternal side with breast cancer diagnosed at age 39. No family member has been genetically tested yet  GYNECOLOGIC HISTORY:  No LMP recorded. Patient is postmenopausal. Menarche age 54, first live birth age 51, she is [place] P5. She is status post bilateral tubal ligation. She stopped having periods in 1985. She tells me she did not use hormone replacement. However a note from her bone density scan December 2001 states "this patient began hormone replacement therapy 3 months ago"  SOCIAL HISTORY:  She is originally from [place]. She worked for [employer], but she is now retired. She has survived 2 husbands. At home currently is  just she and her oldest daughter Jamekia Gannett. She tells me this daughter had mild brain damage at birth and is not working, and cannot drive because of a history of seizures. However she is the one she is planning to name is her healthcare power of attorney. The patient has 13 grandchildren and 4 great-grandchildren. She attends a [employer]: At the 10/21/2016 visit the patient was given the appropriate documents to complete and notarize at her discretion.  She intends to name her youngest duaghter Dot Lanes, lives in [place], as her healthcare power of attorney.    HEALTH MAINTENANCE: Social History   Tobacco Use  . Smoking status: Never Smoker  . Smokeless tobacco: Never Used  Substance Use Topics  . Alcohol use: No  . Drug use: No     Colonoscopy: 12/22/2015/[facility]  PAP:  Bone density: 12/18/2017 with a T score of -0.7   Allergies  Allergen Reactions  . Bee Venom Anaphylaxis    Current Outpatient Medications  Medication Sig Dispense Refill  . anastrozole (ARIMIDEX) 1 MG tablet Take 1 tablet (1 mg total) by mouth daily. 90 tablet 3  . calcium carbonate (TUMS) 500 MG chewable tablet Chew 1 tablet (200 mg of elemental calcium total) by mouth 2 (two) times daily. 120 tablet 4   No current facility-administered medications for this visit.     OBJECTIVE: Older white woman who appears stated age  73:   06/18/18 1234  BP: (!) 169/97  Pulse: 89  Resp: 18  Temp: 98.5 F (36.9 C)  SpO2: 96%     Body mass index is 43.97 kg/m.    ECOG FS:1 - Symptomatic but completely ambulatory   Sclerae unicteric, EOMs intact Oropharynx clear, slightly dry No cervical or supraclavicular adenopathy Lungs no rales or rhonchi Heart regular rate and rhythm Abd soft, obese, nontender, positive bowel sounds MSK no focal spinal tenderness Neuro: nonfocal, well oriented, appropriate affect Breasts: Status post bilateral mastectomies and  radiation.  The incision on the right side in particular is irregular and there is quite a bit of scar tissue associated with it.  The one on the left side is flatter.  There are significant "dog ears" sides which the patient is trying to catch removed: Both axillae are benign  LAB RESULTS:  CMP     Component Value Date/Time   NA 137 05/21/2018 1007   NA 140 03/21/2017 0918   K 4.2 05/21/2018 1007   K 3.7 03/21/2017 0918   CL 106 05/21/2018 1007   CO2 21 (L) 05/21/2018 1007   CO2 23 03/21/2017 0918   GLUCOSE 126 (H) 05/21/2018 1007   GLUCOSE 120 03/21/2017 0918   BUN 11 05/21/2018 1007   BUN 6.6 (L) 03/21/2017 0918   CREATININE  0.65 05/21/2018 1007   CREATININE 0.6 03/21/2017 0918   CALCIUM 8.5 (L) 05/21/2018 1007   CALCIUM 8.6 03/21/2017 0918   PROT 7.0 05/21/2018 1007   PROT 6.3 (L) 03/21/2017 0918   ALBUMIN 3.0 (L) 05/21/2018 1007   ALBUMIN 2.4 (L) 03/21/2017 0918   AST 72 (H) 05/21/2018 1007   AST 53 (H) 03/21/2017 0918   ALT 48 (H) 05/21/2018 1007   ALT 29 03/21/2017 0918   ALKPHOS 108 05/21/2018 1007   ALKPHOS 123 03/21/2017 0918   BILITOT 0.4 05/21/2018 1007   BILITOT 0.55 03/21/2017 0918   GFRNONAA >60 05/21/2018 1007   GFRAA >60 05/21/2018 1007    No results found for: TOTALPROTELP, ALBUMINELP, A1GS, A2GS, BETS, BETA2SER, GAMS, MSPIKE, SPEI  No results found for: KPAFRELGTCHN, LAMBDASER, [facility]  Lab Results  Component Value Date   WBC 5.2 06/18/2018   NEUTROABS 3.2 06/18/2018   HGB 11.2 (L) 06/18/2018   HCT 35.8 (L) 06/18/2018   MCV 98.4 06/18/2018   PLT 169 06/18/2018      Chemistry      Component Value Date/Time   NA 137 05/21/2018 1007   NA 140 03/21/2017 0918   K 4.2 05/21/2018 1007   K 3.7 03/21/2017 0918   CL 106 05/21/2018 1007   CO2 21 (L) 05/21/2018 1007   CO2 23 03/21/2017 0918   BUN 11 05/21/2018 1007   BUN 6.6 (L) 03/21/2017 0918   CREATININE 0.65 05/21/2018 1007   CREATININE 0.6 03/21/2017 0918      Component Value  Date/Time   CALCIUM 8.5 (L) 05/21/2018 1007   CALCIUM 8.6 03/21/2017 0918   ALKPHOS 108 05/21/2018 1007   ALKPHOS 123 03/21/2017 0918   AST 72 (H) 05/21/2018 1007   AST 53 (H) 03/21/2017 0918   ALT 48 (H) 05/21/2018 1007   ALT 29 03/21/2017 0918   BILITOT 0.4 05/21/2018 1007   BILITOT 0.55 03/21/2017 0918       No results found for: LABCA2  No components found for: [id number]  No results for input(s): INR in the last 168 hours.  Urinalysis    Component Value Date/Time   COLORURINE YELLOW 11/06/2017 1116   APPEARANCEUR CLEAR 11/06/2017 1116   LABSPEC 1.020 11/06/2017 1116   PHURINE 5.0 11/06/2017 1116   GLUCOSEU NEGATIVE 11/06/2017 1116   GLUCOSEU NEGATIVE 01/25/2010 0858   HGBUR NEGATIVE 11/06/2017 1116   HGBUR 1+ 12/30/2008 0838   BILIRUBINUR NEGATIVE 11/06/2017 1116   BILIRUBINUR Neg 04/28/2015 1122   KETONESUR NEGATIVE 11/06/2017 1116   PROTEINUR NEGATIVE 11/06/2017 1116   UROBILINOGEN 0.2 04/28/2015 1122   UROBILINOGEN 0.2 01/25/2010 0858   NITRITE NEGATIVE 11/06/2017 1116   LEUKOCYTESUR SMALL (A) 11/06/2017 1116     STUDIES: Scans are pending  ELIGIBLE FOR AVAILABLE RESEARCH PROTOCOL: no  ASSESSMENT: 73 y.o. [place], [place] woman status post right breast overlapping sites biopsy 2 and lymph node biopsy 10/07/2016 all positive for an invasive ductal carcinoma, grade 2, estrogen and progesterone receptor positive, HER-2 nonamplified, with an MIB-1 between 15 and 20%.--This is clinical stage IIIB  (a) breast MRI suggests left-sided disease, biopsy 11/18/2016 shows atypical ductal hyperplasia  (1) genetics testing 11/19/2016--Genetic counseling and testing for hereditary cancer syndromes performed on 11/19/2016. Results are negative for pathogenic mutations in 46 genes analyzed by [facility]'s Common Hereditary Cancers Panel. Results are dated 11/26/2016. Genes tested: APC, ATM, AXIN2, BARD1, BMPR1A, BRCA1, BRCA2, BRIP1, CDH1, CDKN2A, CHEK2, CTNNA1, DICER1, EPCAM,  GREM1, HOXB13, KIT, MEN1, MLH1, MSH2, MSH3, MSH6, MUTYH, NBN,  NF1, NTHL1, PALB2, PDGFRA, PMS2, POLD1, POLE, PTEN, RAD50, RAD51C, RAD51D, SDHA, SDHB, SDHC, SDHD, SMAD4, SMARCA4, STK11, TP53, TSC1, TSC2, and VHL.  (a) A variant of uncertain significance (not clinically actionable) was noted in CTNNA1.    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 There was a 4 cm right breast mass with right axillary lymph nodes and evidence of cirrhosis.  (3) neoadjuvant treatment to consist of cyclophosphamide, methotrexate, and fluorouracil (CMF) chemotherapy every 21 days 8, starting 11/05/2016  (4) Bilateral mastectomies on 04/04/2017 left breast: DCIS, 1.2 cm, margins negative, right Breast: IMC predominantly lobular, grade 2, 8 cm, Posterior margin focally positive, 10/10 lymph noes involved.  Both are ER/PR positive, invasive tumor was HER-2 negative (ratio 1.48).  (a) the patient is interested in eventual reconstruction  (5) adjuvant radiation from 06/02/2017-07/10/2017: 1) Right chest wall/ 50.4 Gy in 28 fractions.  2) L-spine/ 30 Gy in 10 fractions  (6) Anastrozole started 09/2017  (a) bone density 12/18/2017 showed a T score of -0.7 normal (although scan quality was limited and lumbar spine was not utilized due to advanced degenerative changes. )  (7) Xgeva restarted on 11/28/17, given monthly; changed to every 6 months after January 2020 dose  PLAN: Sheralee is now 1-1/2 years out from definitive diagnosis of metastatic breast cancer.  Her disease is well controlled both clinically and by scans.  She is tolerating anastrozole well.  At this point we are going to start broadening her follow-up and simplifying her treatment.  She will continue on anastrozole indefinitely, or until there is disease progression.  The denosumab/Xgeva is going to change to every 6 months.  We will keep it at the current  dose otherwise (120, not 60 mg).  She is expecting to get some plastic postsurgical corrections from Dr. Marla Roe and would like to have the port removed.  I have no problems with that of course.  It does not mean that we will have to obtain her lab work peripherally which has been difficult in the past but this does not motivate her to keep the port  She wanted to know if she could drive.  I told her I could not answer that question but that there is a driving evaluation program in [place] that she could check out to reassure herself that she would be able to drive  Otherwise she will return to see me in July.  Shortly before that visit she will have a repeat CT scan of the chest.  She knows to call for any other issue that may develop before the next visit.  Brinson Tozzi, Virgie Dad, MD  06/18/18 1:10 PM Medical Oncology and Hematology [facility] [address] [place], [place] [address] Tel. [phone]    Fax. [phone]  I, Wilburn Mylar, am acting as scribe for Dr. Virgie Dad. Paulina Muchmore.  I, Lurline Del MD, have reviewed the above documentation for accuracy and completeness, and I agree with the above.

## 2018-06-18 ENCOUNTER — Inpatient Hospital Stay: Payer: Medicare Other

## 2018-06-18 ENCOUNTER — Inpatient Hospital Stay (HOSPITAL_BASED_OUTPATIENT_CLINIC_OR_DEPARTMENT_OTHER): Payer: Medicare Other | Admitting: Oncology

## 2018-06-18 ENCOUNTER — Telehealth: Payer: Self-pay | Admitting: Oncology

## 2018-06-18 VITALS — BP 169/97 | HR 89 | Temp 98.5°F | Resp 18 | Wt 217.7 lb

## 2018-06-18 DIAGNOSIS — Z6841 Body Mass Index (BMI) 40.0 and over, adult: Secondary | ICD-10-CM

## 2018-06-18 DIAGNOSIS — K7469 Other cirrhosis of liver: Secondary | ICD-10-CM

## 2018-06-18 DIAGNOSIS — Z170 Estrogen receptor positive status [ER+]: Principal | ICD-10-CM

## 2018-06-18 DIAGNOSIS — C50811 Malignant neoplasm of overlapping sites of right female breast: Secondary | ICD-10-CM

## 2018-06-18 DIAGNOSIS — C7951 Secondary malignant neoplasm of bone: Secondary | ICD-10-CM

## 2018-06-18 DIAGNOSIS — Z79811 Long term (current) use of aromatase inhibitors: Secondary | ICD-10-CM | POA: Diagnosis not present

## 2018-06-18 DIAGNOSIS — Z452 Encounter for adjustment and management of vascular access device: Secondary | ICD-10-CM | POA: Diagnosis not present

## 2018-06-18 DIAGNOSIS — Z95828 Presence of other vascular implants and grafts: Secondary | ICD-10-CM

## 2018-06-18 LAB — COMPREHENSIVE METABOLIC PANEL
ALBUMIN: 3 g/dL — AB (ref 3.5–5.0)
ALT: 38 U/L (ref 0–44)
AST: 50 U/L — ABNORMAL HIGH (ref 15–41)
Alkaline Phosphatase: 126 U/L (ref 38–126)
Anion gap: 7 (ref 5–15)
BUN: 15 mg/dL (ref 8–23)
CO2: 25 mmol/L (ref 22–32)
Calcium: 8.8 mg/dL — ABNORMAL LOW (ref 8.9–10.3)
Chloride: 108 mmol/L (ref 98–111)
Creatinine, Ser: 0.66 mg/dL (ref 0.44–1.00)
GFR calc Af Amer: >60 mL/min (ref >60)
GFR calc non Af Amer: >60 mL/min (ref >60)
GLUCOSE: 173 mg/dL — AB (ref 70–99)
Potassium: 3.9 mmol/L (ref 3.5–5.1)
Sodium: 140 mmol/L (ref 135–145)
Total Bilirubin: 0.3 mg/dL (ref 0.3–1.2)
Total Protein: 6.9 g/dL (ref 6.5–8.1)

## 2018-06-18 LAB — CBC WITH DIFFERENTIAL/PLATELET
Abs Immature Granulocytes: 0.02 10*3/uL (ref 0.00–0.07)
Basophils Absolute: 0 10*3/uL (ref 0.0–0.1)
Basophils Relative: 1 %
Eosinophils Absolute: 0.2 10*3/uL (ref 0.0–0.5)
Eosinophils Relative: 5 %
HEMATOCRIT: 35.8 % — AB (ref 36.0–46.0)
Hemoglobin: 11.2 g/dL — ABNORMAL LOW (ref 12.0–15.0)
Immature Granulocytes: 0 %
LYMPHS PCT: 24 %
Lymphs Abs: 1.3 10*3/uL (ref 0.7–4.0)
MCH: 30.8 pg (ref 26.0–34.0)
MCHC: 31.3 g/dL (ref 30.0–36.0)
MCV: 98.4 fL (ref 80.0–100.0)
Monocytes Absolute: 0.4 10*3/uL (ref 0.1–1.0)
Monocytes Relative: 8 %
Neutro Abs: 3.2 10*3/uL (ref 1.7–7.7)
Neutrophils Relative %: 62 %
Platelets: 169 10*3/uL (ref 150–400)
RBC: 3.64 MIL/uL — ABNORMAL LOW (ref 3.87–5.11)
RDW: 15.9 % — ABNORMAL HIGH (ref 11.5–15.5)
WBC: 5.2 10*3/uL (ref 4.0–10.5)
nRBC: 0 % (ref 0.0–0.2)

## 2018-06-18 MED ORDER — SODIUM CHLORIDE 0.9% FLUSH
10.0000 mL | INTRAVENOUS | Status: DC | PRN
  Administered 2018-06-18: 10 mL via INTRAVENOUS
  Filled 2018-06-18: qty 10

## 2018-06-18 MED ORDER — ANASTROZOLE 1 MG PO TABS: 1.0000 mg | ORAL_TABLET | Freq: Every day | ORAL | 3 refills | Status: DC

## 2018-06-18 MED ORDER — HEPARIN SOD (PORK) LOCK FLUSH 100 UNIT/ML IV SOLN
500.0000 [IU] | Freq: Once | INTRAVENOUS | Status: AC
  Administered 2018-06-18: 500 [IU] via INTRAVENOUS
  Filled 2018-06-18: qty 5

## 2018-06-18 NOTE — Telephone Encounter (Signed)
Author re-attempted contact, was able to leave detailed VM regarding pt. Interest in re-establishing care with another PCP in office and set up AWV. Awaiting return call.

## 2018-06-18 NOTE — Telephone Encounter (Signed)
Noted  

## 2018-06-18 NOTE — Telephone Encounter (Signed)
Gave avs and calendar ° °

## 2018-06-18 NOTE — Telephone Encounter (Signed)
Pt returned call, advised per note. Pt says that she is living in [place] now and will TOC to the [facility] location. Pt says that she is going to go in person to the office to schedule her apt to TOC.

## 2018-06-19 ENCOUNTER — Telehealth: Payer: Self-pay | Admitting: Oncology

## 2018-06-19 NOTE — Telephone Encounter (Signed)
Called patient per 1/30 sch message - no answer - sent letter in the mail with updated schedule

## 2018-07-16 ENCOUNTER — Other Ambulatory Visit: Payer: Self-pay

## 2018-07-16 ENCOUNTER — Ambulatory Visit: Payer: Self-pay

## 2018-07-22 ENCOUNTER — Encounter: Payer: Self-pay | Admitting: Internal Medicine

## 2018-07-22 ENCOUNTER — Ambulatory Visit (INDEPENDENT_AMBULATORY_CARE_PROVIDER_SITE_OTHER): Payer: Medicare Other | Admitting: Internal Medicine

## 2018-07-22 VITALS — BP 110/62 | HR 92 | Temp 99.3°F | Ht 59.0 in | Wt 224.8 lb

## 2018-07-22 DIAGNOSIS — C50811 Malignant neoplasm of overlapping sites of right female breast: Secondary | ICD-10-CM

## 2018-07-22 DIAGNOSIS — I10 Essential (primary) hypertension: Secondary | ICD-10-CM

## 2018-07-22 DIAGNOSIS — N390 Urinary tract infection, site not specified: Secondary | ICD-10-CM

## 2018-07-22 DIAGNOSIS — E039 Hypothyroidism, unspecified: Secondary | ICD-10-CM | POA: Diagnosis not present

## 2018-07-22 DIAGNOSIS — C7951 Secondary malignant neoplasm of bone: Secondary | ICD-10-CM | POA: Diagnosis not present

## 2018-07-22 DIAGNOSIS — Z170 Estrogen receptor positive status [ER+]: Secondary | ICD-10-CM | POA: Diagnosis not present

## 2018-07-22 DIAGNOSIS — G4733 Obstructive sleep apnea (adult) (pediatric): Secondary | ICD-10-CM

## 2018-07-22 DIAGNOSIS — Z23 Encounter for immunization: Secondary | ICD-10-CM | POA: Diagnosis not present

## 2018-07-22 LAB — T3, FREE: T3, Free: 3.9 pg/mL (ref 2.3–4.2)

## 2018-07-22 LAB — TSH: TSH: 4.49 u[IU]/mL (ref 0.35–4.50)

## 2018-07-22 LAB — T4, FREE: Free T4: 0.74 ng/dL (ref 0.60–1.60)

## 2018-07-22 NOTE — Progress Notes (Signed)
Established Patient Office Visit     CC/Reason for Visit: transfer of care, follow up chronic medical conditions  HPI: Jasmine Hood is a 73 y.o. female who is coming in today for the above mentioned reasons. Past Medical History is significant for breast cancer stage IV, morbid obsesity, hypothyroidism, hypertension and UTI.  Patient is having breast reconstruction in a few weeks at [facility] and her ONC is [facility].  She will then f/u with [facility] every 3 months.  Patient has hx of HTN and hypothyroidism, and does not take medicine for either.  Her blood pressure today is stable at 110/68.  She doesn't want to take thyroid medicine b/c she thinks it ruins your kidneys.  She says she has not had a UTI in a long time.  She has a hx of OSA but does not wear a CPAP.  She had a sleep study done many years ago and may need a repeat the sleep study.  Patient is due for a flu, shingles and tetanus vaccines.     Past Medical/Surgical History: Past Medical History:  Diagnosis Date  . Breast cancer ([place]) 10/07/2016   right breast, spine  . Brown recluse spider bite    right leg  . COLONIC POLYPS, HX OF 01/05/2009   Qualifier: Diagnosis of  By: [name]  MD, [name]   . Complication of anesthesia    slow to awaken after wisdom  teeth extraction age 21  . Constipation   . Genetic testing 11/27/2016   Ms. Neiswender underwent genetic counseling and testing for hereditary cancer syndromes on 11/19/2016. Her results were negative for mutations in all 46 genes analyzed by [facility]'s 46-gene Common Hereditary Cancers Panel. Genes analyzed include: APC, ATM, AXIN2, BARD1, BMPR1A, BRCA1, BRCA2, BRIP1, CDH1, CDKN2A, CHEK2, CTNNA1, DICER1, EPCAM, GREM1, HOXB13, KIT, MEN1, MLH1, MSH2, MSH3, MSH6, MUTYH, NBN,  . HYPOTHYROIDISM 02/01/2010   Qualifier: Diagnosis of  By: [name]  MD, [name]   . Morbid obesity ([place]) 01/05/2009   Qualifier: Diagnosis of  By: [name]  MD, [name]   . Pneumonia  1987  . Sleep apnea    no cpap used   . Stroke [facility])    found on CT scan    Past Surgical History:  Procedure Laterality Date  . BILATERAL TOTAL MASTECTOMY WITH AXILLARY LYMPH NODE DISSECTION Bilateral 04/04/2017   Procedure: RIGHT MASTECTOMY WITH TARGETED RIGHT AXILLARY NODE DISSECTION AND LEFT PROPHYLATIC MASTECTOMY ERAS PATHWAY;  Surgeon: Alphonsa Overall, MD;  Location: [place];  Service: General;  Laterality: Bilateral;  PEC BLOCK  . BREAST BIOPSY Right 10/07/2016   invasive mammary carcinoma  . BREAST BIOPSY Left 10/08/2016   breast fibroadenoma no malignancy  . CATARACT EXTRACTION Bilateral   . COLONOSCOPY W/ BIOPSIES AND POLYPECTOMY  2012  . COLONOSCOPY WITH PROPOFOL N/A 12/22/2015   Procedure: COLONOSCOPY WITH PROPOFOL;  Surgeon: [name], MD;  [facility];  Service: Endoscopy;  Laterality: N/A;  . INCISION AND DRAINAGE Right 1998   surgery for spider bite on RLE; "[name]"  . MASTECTOMY Left 04/04/2017   [name]  . MASTECTOMY WITH AXILLARY LYMPH NODE DISSECTION Right 04/04/2017  . MOLE REMOVAL  10/29/2016   Procedure: MOLE REMOVAL LEFT CHEST;  Surgeon: Alphonsa Overall, MD;  [facility];  Service: General;;  . PORTACATH PLACEMENT N/A 10/29/2016   Procedure: INSERTION PORT-A-CATH;  Surgeon: Alphonsa Overall, MD;  [facility];  Service: General;  Laterality: N/A;  .  TUBAL LIGATION    . WISDOM TOOTH EXTRACTION  age 65    Social History:  reports that she has never smoked. She has never used smokeless tobacco. She reports that she does not drink alcohol or use drugs.  Allergies: Allergies  Allergen Reactions  . Bee Venom Anaphylaxis  . Latex Swelling    Family History: Family History  Problem Relation Age of Onset  . Breast cancer Mother 46       d.52 from metastatic disease  . Throat cancer Father        d.65 history of smoking  . Rheum arthritis Sister        3 sisters pos. for osteo and RA  . Diabetes Maternal Aunt   . Cancer  Maternal Grandfather        mouth cancer-chewed tobacco  . Breast cancer Cousin 24  . Breast cancer Daughter 20  . Stomach cancer Sister 96     Current Outpatient Medications:  .  anastrozole (ARIMIDEX) 1 MG tablet, Take 1 tablet (1 mg total) by mouth daily., Disp: 90 tablet, Rfl: 3 .  calcium carbonate (TUMS) 500 MG chewable tablet, Chew 1 tablet (200 mg of elemental calcium total) by mouth 2 (two) times daily., Disp: 120 tablet, Rfl: 4  Review of Systems:  Constitutional: Denies fever, chills, diaphoresis, appetite change and fatigue.  HEENT: Denies photophobia, eye pain, redness, hearing loss, ear pain, congestion, sore throat, rhinorrhea, sneezing, mouth sores, trouble swallowing, neck pain, neck stiffness and tinnitus.   Respiratory: Denies SOB, DOE, cough, chest tightness,  and wheezing.   Cardiovascular: Denies chest pain, palpitations and leg swelling.  Gastrointestinal: Denies nausea, vomiting, abdominal pain, diarrhea, constipation, blood in stool and abdominal distention.  Genitourinary: Denies dysuria, urgency, frequency, hematuria, flank pain and difficulty urinating.  Endocrine: Denies: hot or cold intolerance, sweats, changes in hair or nails, polyuria, polydipsia. Musculoskeletal: Denies myalgias, back pain, joint swelling, arthralgias and gait problem.  Skin: Denies pallor, rash and wound.  Neurological: Denies dizziness, seizures, syncope, weakness, light-headedness, numbness and headaches.  Hematological: Denies adenopathy. Easy bruising, personal or family bleeding history  Psychiatric/Behavioral: Denies suicidal ideation, mood changes, confusion, nervousness, sleep disturbance and agitation   Physical Exam: Vitals:   07/22/18 1417  BP: 110/62  Pulse: 92  Temp: 99.3 F (37.4 C)  TempSrc: Oral  SpO2: 94%  Weight: 224 lb 12.8 oz (102 kg)  Height: _0  (1.499 m)    Body mass index is 45.4 kg/m.   Constitutional: NAD, calm, comfortable Eyes: PERRL, lids  and conjunctivae normal Respiratory: clear to auscultation bilaterally, no wheezing, no crackles. Normal respiratory effort. No accessory muscle use.  Cardiovascular: Regular rate and rhythm, no murmurs / rubs / gallops. No extremity edema. 2+ pedal pulses. No carotid bruits.  Port A Cath in place, not accessed  Abd: obese, S/NT/ND/+BS Psychiatric: Normal judgment and insight. Alert and oriented x 3. Normal mood. positive attitude   Impression and Plan:  Malignant neoplasm of overlapping sites of right breast in female, estrogen receptor positive ([place]) Bone metastases ([place]) -will f/u with Dr. [name] as scheduled -upcoming breast reconstructive surgery on 3/18  Hypothyroidism, unspecified type -will check TFTs especially with upcoming surgery.  Essential hypertension -stable  OSA (obstructive sleep apnea) -referred to [facility] for sleep study and CPAP fitting. -Says she had a sleep study years ago that confirmed Dx but did not want to do CPAP then.  MORBID OBESITY -BMI 45. -Healthy lifestyle encouraged   -received flu vaccine -suggested she  receive her shingles and tetanus vaccines at pharmacy    Patient Instructions  Great meeting you this afternoon.  Instructions: -Schedule a sleep study after you heal from your surgery; we have referred you to see a lung doctor -Schedule an annual physical exam for May, come fasting, so we can draw blood work -[facility] for your tetanus and shingles vaccines -You will receive your flu vaccine today in the office -Good luck with your surgery in a couple of weeks    Sleep Apnea Sleep apnea affects breathing during sleep. It causes breathing to stop for a short time or to become shallow. It can also increase the risk of:  Heart attack.  Stroke.  Being very overweight (obese).  Diabetes.  Heart failure.  Irregular heartbeat. The goal of treatment is to help you breathe normally again. What are the causes? There are three  kinds of sleep apnea:  Obstructive sleep apnea. This is caused by a blocked or collapsed airway.  Central sleep apnea. This happens when the brain does not send the right signals to the muscles that control breathing.  Mixed sleep apnea. This is a combination of obstructive and central sleep apnea. The most common cause of this condition is a collapsed or blocked airway. This can happen if:  Your throat muscles are too relaxed.  Your tongue and tonsils are too large.  You are overweight.  Your airway is too small. What increases the risk?  Being overweight.  Smoking.  Having a small airway.  Being older.  Being female.  Drinking alcohol.  Taking medicines to calm yourself (sedatives or tranquilizers).  Having family members with the condition. What are the signs or symptoms?  Trouble staying asleep.  Being sleepy or tired during the day.  Getting angry a lot.  Loud snoring.  Headaches in the morning.  Not being able to focus your mind (concentrate).  Forgetting things.  Less interest in sex.  Mood swings.  Personality changes.  Feelings of sadness (depression).  Waking up a lot during the night to pee (urinate).  Dry mouth.  Sore throat. How is this diagnosed?  Your medical history.  A physical exam.  A test that is done when you are sleeping (sleep study). The test is most often done in a sleep lab but may also be done at home. How is this treated?   Sleeping on your side.  Using a medicine to get rid of mucus in your nose (decongestant).  Avoiding the use of alcohol, medicines to help you relax, or certain pain medicines (narcotics).  Losing weight, if needed.  Changing your diet.  Not smoking.  Using a machine to open your airway while you sleep, such as: ? An oral appliance. This is a mouthpiece that shifts your lower jaw forward. ? A CPAP device. This device blows air through a mask when you breathe out (exhale). ? An EPAP  device. This has valves that you put in each nostril. ? A BPAP device. This device blows air through a mask when you breathe in (inhale) and breathe out.  Having surgery if other treatments do not work. It is important to get treatment for sleep apnea. Without treatment, it can lead to:  High blood pressure.  Coronary artery disease.  In men, not being able to have an erection (impotence).  Reduced thinking ability. Follow these instructions at home: Lifestyle  Make changes that your doctor recommends.  Eat a healthy diet.  Lose weight if needed.  Avoid alcohol, medicines to help you relax, and some pain medicines.  Do not use any products that contain nicotine or tobacco, such as cigarettes, e-cigarettes, and chewing tobacco. If you need help quitting, ask your doctor. General instructions  Take over-the-counter and prescription medicines only as told by your doctor.  If you were given a machine to use while you sleep, use it only as told by your doctor.  If you are having surgery, make sure to tell your doctor you have sleep apnea. You may need to bring your device with you.  Keep all follow-up visits as told by your doctor. This is important. Contact a doctor if:  The machine that you were given to use during sleep bothers you or does not seem to be working.  You do not get better.  You get worse. Get help right away if:  Your chest hurts.  You have trouble breathing in enough air.  You have an uncomfortable feeling in your back, arms, or stomach.  You have trouble talking.  One side of your body feels weak.  A part of your face is hanging down. These symptoms may be an emergency. Do not wait to see if the symptoms will go away. Get medical help right away. Call your local emergency services (911 in the [place] Do not drive yourself to the hospital. Summary  This condition affects breathing during sleep.  The most common cause is a collapsed or blocked  airway.  The goal of treatment is to help you breathe normally while you sleep. This information is not intended to replace advice given to you by your health care provider. Make sure you discuss any questions you have with your health care provider. Document Released: 02/13/2008 Document Revised: 12/30/2017 Document Reviewed: 12/30/2017 Elsevier Interactive Patient Education  2019 [place], RN DNP Student [facility]

## 2018-07-22 NOTE — Patient Instructions (Addendum)
Great meeting you this afternoon.  Instructions: -Schedule a sleep study after you heal from your surgery; we have referred you to see a lung doctor -Schedule an annual physical exam for May, come fasting, so we can draw blood work -[facility] for your tetanus and shingles vaccines -You will receive your flu vaccine today in the office -Good luck with your surgery in a couple of weeks    Sleep Apnea Sleep apnea affects breathing during sleep. It causes breathing to stop for a short time or to become shallow. It can also increase the risk of:  Heart attack.  Stroke.  Being very overweight (obese).  Diabetes.  Heart failure.  Irregular heartbeat. The goal of treatment is to help you breathe normally again. What are the causes? There are three kinds of sleep apnea:  Obstructive sleep apnea. This is caused by a blocked or collapsed airway.  Central sleep apnea. This happens when the brain does not send the right signals to the muscles that control breathing.  Mixed sleep apnea. This is a combination of obstructive and central sleep apnea. The most common cause of this condition is a collapsed or blocked airway. This can happen if:  Your throat muscles are too relaxed.  Your tongue and tonsils are too large.  You are overweight.  Your airway is too small. What increases the risk?  Being overweight.  Smoking.  Having a small airway.  Being older.  Being female.  Drinking alcohol.  Taking medicines to calm yourself (sedatives or tranquilizers).  Having family members with the condition. What are the signs or symptoms?  Trouble staying asleep.  Being sleepy or tired during the day.  Getting angry a lot.  Loud snoring.  Headaches in the morning.  Not being able to focus your mind (concentrate).  Forgetting things.  Less interest in sex.  Mood swings.  Personality changes.  Feelings of sadness (depression).  Waking up a lot during the night to  pee (urinate).  Dry mouth.  Sore throat. How is this diagnosed?  Your medical history.  A physical exam.  A test that is done when you are sleeping (sleep study). The test is most often done in a sleep lab but may also be done at home. How is this treated?   Sleeping on your side.  Using a medicine to get rid of mucus in your nose (decongestant).  Avoiding the use of alcohol, medicines to help you relax, or certain pain medicines (narcotics).  Losing weight, if needed.  Changing your diet.  Not smoking.  Using a machine to open your airway while you sleep, such as: ? An oral appliance. This is a mouthpiece that shifts your lower jaw forward. ? A CPAP device. This device blows air through a mask when you breathe out (exhale). ? An EPAP device. This has valves that you put in each nostril. ? A BPAP device. This device blows air through a mask when you breathe in (inhale) and breathe out.  Having surgery if other treatments do not work. It is important to get treatment for sleep apnea. Without treatment, it can lead to:  High blood pressure.  Coronary artery disease.  In men, not being able to have an erection (impotence).  Reduced thinking ability. Follow these instructions at home: Lifestyle  Make changes that your doctor recommends.  Eat a healthy diet.  Lose weight if needed.  Avoid alcohol, medicines to help you relax, and some pain medicines.  Do not use  any products that contain nicotine or tobacco, such as cigarettes, e-cigarettes, and chewing tobacco. If you need help quitting, ask your doctor. General instructions  Take over-the-counter and prescription medicines only as told by your doctor.  If you were given a machine to use while you sleep, use it only as told by your doctor.  If you are having surgery, make sure to tell your doctor you have sleep apnea. You may need to bring your device with you.  Keep all follow-up visits as told by your  doctor. This is important. Contact a doctor if:  The machine that you were given to use during sleep bothers you or does not seem to be working.  You do not get better.  You get worse. Get help right away if:  Your chest hurts.  You have trouble breathing in enough air.  You have an uncomfortable feeling in your back, arms, or stomach.  You have trouble talking.  One side of your body feels weak.  A part of your face is hanging down. These symptoms may be an emergency. Do not wait to see if the symptoms will go away. Get medical help right away. Call your local emergency services (911 in the [place] Do not drive yourself to the hospital. Summary  This condition affects breathing during sleep.  The most common cause is a collapsed or blocked airway.  The goal of treatment is to help you breathe normally while you sleep. This information is not intended to replace advice given to you by your health care provider. Make sure you discuss any questions you have with your health care provider. Document Released: 02/13/2008 Document Revised: 12/30/2017 Document Reviewed: 12/30/2017 Elsevier Interactive Patient Education  [employer].

## 2018-07-28 ENCOUNTER — Encounter (HOSPITAL_BASED_OUTPATIENT_CLINIC_OR_DEPARTMENT_OTHER): Payer: Self-pay | Admitting: *Deleted

## 2018-07-28 ENCOUNTER — Other Ambulatory Visit: Payer: Self-pay

## 2018-07-29 NOTE — H&P (Signed)
Here for pre-op consult.  Obesity, OSA. No HTN or cardiac history.  Airway looks good.  Clear for surgery at [facility]

## 2018-07-29 NOTE — Progress Notes (Signed)
Chart reviewed with Dr Marcell Barlow. Patient here for consult.  OK to proceed with scheduled surgery.

## 2018-07-31 ENCOUNTER — Ambulatory Visit (INDEPENDENT_AMBULATORY_CARE_PROVIDER_SITE_OTHER): Payer: Self-pay | Admitting: Plastic Surgery

## 2018-07-31 ENCOUNTER — Other Ambulatory Visit: Payer: Self-pay

## 2018-07-31 ENCOUNTER — Telehealth: Payer: Self-pay | Admitting: Internal Medicine

## 2018-07-31 ENCOUNTER — Encounter: Payer: Self-pay | Admitting: Plastic Surgery

## 2018-07-31 VITALS — BP 154/64 | HR 83 | Temp 98.7°F | Ht 59.0 in | Wt 224.8 lb

## 2018-07-31 DIAGNOSIS — Z9013 Acquired absence of bilateral breasts and nipples: Secondary | ICD-10-CM

## 2018-07-31 DIAGNOSIS — Z853 Personal history of malignant neoplasm of breast: Secondary | ICD-10-CM

## 2018-07-31 MED ORDER — CEPHALEXIN 500 MG PO CAPS: 500.0000 mg | ORAL_CAPSULE | Freq: Four times a day (QID) | ORAL | 0 refills | Status: AC

## 2018-07-31 MED ORDER — HYDROCODONE-ACETAMINOPHEN 5-325 MG PO TABS: 1.0000 | ORAL_TABLET | Freq: Four times a day (QID) | ORAL | 0 refills | Status: DC | PRN

## 2018-07-31 MED ORDER — ONDANSETRON HCL 4 MG PO TABS: 4.0000 mg | ORAL_TABLET | Freq: Three times a day (TID) | ORAL | 0 refills | Status: AC | PRN

## 2018-07-31 NOTE — Progress Notes (Signed)
Patient ID: Jasmine Hood, female    DOB: 1945/12/11, 73 y.o.   MRN: [id number]   Chief Complaint  Patient presents with  . Pre-op Exam    for breast (B) reconstruction    The patient is a 73 year old female here with her daughter for a history and physical for breast reconstruction revision.  She had bilateral mastectomies by Dr. Lucia Gaskins for estrogen positive breast cancer.  She is still on anastrozole.  She is not wanting to have the reconstruction of her breast but a revision of her the breast chest wall tissue.  She has excess tissue medially and laterally.  She complains it is difficult and uncomfortable both in positioning while lying down and fitting into close.  She has multiple medical conditions including cirrhosis, bone mets and morbid obesity.  She is 4 feet and 11 inches tall she weighs over 200 pounds.  She would also like her Port-A-Cath removed and it is currently on the left side.  She also has a skin lesion in her gluteal cleft that is bothering her and she has asked for it to be removed as well.   Review of Systems  Constitutional: Negative.   HENT: Negative.   Eyes: Negative.   Respiratory: Negative.   Cardiovascular: Negative.   Gastrointestinal: Negative.   Endocrine: Negative.   Genitourinary: Negative.   Musculoskeletal: Negative.   Neurological: Negative.   Hematological: Negative.   Psychiatric/Behavioral: Negative.     Past Medical History:  Diagnosis Date  . Breast cancer ([place]) 10/07/2016   right breast, spine  . Brown recluse spider bite    right leg  . COLONIC POLYPS, HX OF 01/05/2009   Qualifier: Diagnosis of  By: [name]  MD, [name]   . Complication of anesthesia    slow to awaken after wisdom  teeth extraction age 44  . Constipation   . Genetic testing 11/27/2016   Ms. Denman underwent genetic counseling and testing for hereditary cancer syndromes on 11/19/2016. Her results were negative for mutations in all 46 genes analyzed by [facility]'s  46-gene Common Hereditary Cancers Panel. Genes analyzed include: APC, ATM, AXIN2, BARD1, BMPR1A, BRCA1, BRCA2, BRIP1, CDH1, CDKN2A, CHEK2, CTNNA1, DICER1, EPCAM, GREM1, HOXB13, KIT, MEN1, MLH1, MSH2, MSH3, MSH6, MUTYH, NBN,  . HYPOTHYROIDISM 02/01/2010   Qualifier: Diagnosis of  By: [name]  MD, [name]   . Morbid obesity ([place]) 01/05/2009   Qualifier: Diagnosis of  By: [name]  MD, [name]   . Pneumonia 1987  . Sleep apnea    no cpap used   . Stroke [facility])    found on CT scan    Past Surgical History:  Procedure Laterality Date  . BILATERAL TOTAL MASTECTOMY WITH AXILLARY LYMPH NODE DISSECTION Bilateral 04/04/2017   Procedure: RIGHT MASTECTOMY WITH TARGETED RIGHT AXILLARY NODE DISSECTION AND LEFT PROPHYLATIC MASTECTOMY ERAS PATHWAY;  Surgeon: Alphonsa Overall, MD;  Location: [place];  Service: General;  Laterality: Bilateral;  PEC BLOCK  . BREAST BIOPSY Right 10/07/2016   invasive mammary carcinoma  . BREAST BIOPSY Left 10/08/2016   breast fibroadenoma no malignancy  . CATARACT EXTRACTION Bilateral   . COLONOSCOPY W/ BIOPSIES AND POLYPECTOMY  2012  . COLONOSCOPY WITH PROPOFOL N/A 12/22/2015   Procedure: COLONOSCOPY WITH PROPOFOL;  Surgeon: [name], MD;  [facility];  Service: Endoscopy;  Laterality: N/A;  . INCISION AND DRAINAGE Right 1998   surgery for spider bite on RLE; "[name]"  . MASTECTOMY Left 04/04/2017   /  notes  . MASTECTOMY WITH AXILLARY LYMPH NODE DISSECTION Right 04/04/2017  . MOLE REMOVAL  10/29/2016   Procedure: MOLE REMOVAL LEFT CHEST;  Surgeon: Alphonsa Overall, MD;  [facility];  Service: General;;  . PORTACATH PLACEMENT N/A 10/29/2016   Procedure: INSERTION PORT-A-CATH;  Surgeon: Alphonsa Overall, MD;  [facility];  Service: General;  Laterality: N/A;  . TUBAL LIGATION    . WISDOM TOOTH EXTRACTION  age 2      Current Outpatient Medications:  .  anastrozole (ARIMIDEX) 1 MG tablet, Take 1 tablet (1 mg total) by mouth daily.,  Disp: 90 tablet, Rfl: 3 .  calcium carbonate (TUMS) 500 MG chewable tablet, Chew 1 tablet (200 mg of elemental calcium total) by mouth 2 (two) times daily., Disp: 120 tablet, Rfl: 4 .  cephALEXin (KEFLEX) 500 MG capsule, Take 1 capsule (500 mg total) by mouth 4 (four) times daily for 5 days., Disp: 20 capsule, Rfl: 0 .  HYDROcodone-acetaminophen (NORCO) 5-325 MG tablet, Take 1 tablet by mouth every 6 (six) hours as needed for moderate pain., Disp: 30 tablet, Rfl: 0 .  ondansetron (ZOFRAN) 4 MG tablet, Take 1 tablet (4 mg total) by mouth every 8 (eight) hours as needed for up to 5 days., Disp: 15 tablet, Rfl: 0   Objective:   Vitals:   07/31/18 0923  BP: (!) 154/64  Pulse: 83  Temp: 98.7 F (37.1 C)  SpO2: 91%    Physical Exam Vitals signs and nursing note reviewed.  Constitutional:      Appearance: Normal appearance.  HENT:     Head: Normocephalic and atraumatic.     Nose: Nose normal.     Mouth/Throat:     Mouth: Mucous membranes are moist.  Cardiovascular:     Rate and Rhythm: Normal rate.  Pulmonary:     Effort: Pulmonary effort is normal.  Abdominal:     General: Abdomen is flat. There is no distension.  Skin:    General: Skin is warm.  Neurological:     Mental Status: She is alert.  Psychiatric:        Mood and Affect: Mood normal.        Thought Content: Thought content normal.        Judgment: Judgment normal.     Assessment & Plan:  Acquired absence of breast and nipple, bilateral  History of right breast cancer   Plan for removal/excision of excess breast tissue bilaterally at the medial and lateral area of both breasts.  Port-A-Cath removal.  Excision of a gluteal skin lesion.  I explained that I will remove as much as I can but there is a limit to the amount that can be removed from the lateral aspect due to positioning.  The risks that can be encountered with and after excision of skin and tissue were discussed and include the following but not limited to  these: bleeding, infection, delayed healing, anesthesia risks, skin sensation changes, injury to structures including nerves, blood vessels, and muscles which may be temporary or permanent, allergies to tape, suture materials and glues, blood products, topical preparations or injected agents, skin contour irregularities, skin discoloration and swelling, deep vein thrombosis, cardiac and pulmonary complications, pain, which may persist, persistent pain, recurrence of the lesion, poor healing of the incision, possible need for revisional surgery or staged procedures.  Prescription sent to her pharmacy.  [place], DO

## 2018-07-31 NOTE — H&P (View-Only) (Signed)
Patient ID: Jasmine Hood, female    DOB: 1945/12/11, 73 y.o.   MRN: [id number]   Chief Complaint  Patient presents with  . Pre-op Exam    for breast (B) reconstruction    The patient is a 73 year old female here with her daughter for a history and physical for breast reconstruction revision.  She had bilateral mastectomies by Dr. Lucia Gaskins for estrogen positive breast cancer.  She is still on anastrozole.  She is not wanting to have the reconstruction of her breast but a revision of her the breast chest wall tissue.  She has excess tissue medially and laterally.  She complains it is difficult and uncomfortable both in positioning while lying down and fitting into close.  She has multiple medical conditions including cirrhosis, bone mets and morbid obesity.  She is 4 feet and 11 inches tall she weighs over 200 pounds.  She would also like her Port-A-Cath removed and it is currently on the left side.  She also has a skin lesion in her gluteal cleft that is bothering her and she has asked for it to be removed as well.   Review of Systems  Constitutional: Negative.   HENT: Negative.   Eyes: Negative.   Respiratory: Negative.   Cardiovascular: Negative.   Gastrointestinal: Negative.   Endocrine: Negative.   Genitourinary: Negative.   Musculoskeletal: Negative.   Neurological: Negative.   Hematological: Negative.   Psychiatric/Behavioral: Negative.     Past Medical History:  Diagnosis Date  . Breast cancer ([place]) 10/07/2016   right breast, spine  . Brown recluse spider bite    right leg  . COLONIC POLYPS, HX OF 01/05/2009   Qualifier: Diagnosis of  By: [name]  MD, [name]   . Complication of anesthesia    slow to awaken after wisdom  teeth extraction age 44  . Constipation   . Genetic testing 11/27/2016   Ms. Denman underwent genetic counseling and testing for hereditary cancer syndromes on 11/19/2016. Her results were negative for mutations in all 46 genes analyzed by [facility]'s  46-gene Common Hereditary Cancers Panel. Genes analyzed include: APC, ATM, AXIN2, BARD1, BMPR1A, BRCA1, BRCA2, BRIP1, CDH1, CDKN2A, CHEK2, CTNNA1, DICER1, EPCAM, GREM1, HOXB13, KIT, MEN1, MLH1, MSH2, MSH3, MSH6, MUTYH, NBN,  . HYPOTHYROIDISM 02/01/2010   Qualifier: Diagnosis of  By: [name]  MD, [name]   . Morbid obesity ([place]) 01/05/2009   Qualifier: Diagnosis of  By: [name]  MD, [name]   . Pneumonia 1987  . Sleep apnea    no cpap used   . Stroke [facility])    found on CT scan    Past Surgical History:  Procedure Laterality Date  . BILATERAL TOTAL MASTECTOMY WITH AXILLARY LYMPH NODE DISSECTION Bilateral 04/04/2017   Procedure: RIGHT MASTECTOMY WITH TARGETED RIGHT AXILLARY NODE DISSECTION AND LEFT PROPHYLATIC MASTECTOMY ERAS PATHWAY;  Surgeon: Alphonsa Overall, MD;  Location: [place];  Service: General;  Laterality: Bilateral;  PEC BLOCK  . BREAST BIOPSY Right 10/07/2016   invasive mammary carcinoma  . BREAST BIOPSY Left 10/08/2016   breast fibroadenoma no malignancy  . CATARACT EXTRACTION Bilateral   . COLONOSCOPY W/ BIOPSIES AND POLYPECTOMY  2012  . COLONOSCOPY WITH PROPOFOL N/A 12/22/2015   Procedure: COLONOSCOPY WITH PROPOFOL;  Surgeon: [name], MD;  [facility];  Service: Endoscopy;  Laterality: N/A;  . INCISION AND DRAINAGE Right 1998   surgery for spider bite on RLE; "[name]"  . MASTECTOMY Left 04/04/2017   /  notes  . MASTECTOMY WITH AXILLARY LYMPH NODE DISSECTION Right 04/04/2017  . MOLE REMOVAL  10/29/2016   Procedure: MOLE REMOVAL LEFT CHEST;  Surgeon: Alphonsa Overall, MD;  [facility];  Service: General;;  . PORTACATH PLACEMENT N/A 10/29/2016   Procedure: INSERTION PORT-A-CATH;  Surgeon: Alphonsa Overall, MD;  [facility];  Service: General;  Laterality: N/A;  . TUBAL LIGATION    . WISDOM TOOTH EXTRACTION  age 45      Current Outpatient Medications:  .  anastrozole (ARIMIDEX) 1 MG tablet, Take 1 tablet (1 mg total) by mouth daily.,  Disp: 90 tablet, Rfl: 3 .  calcium carbonate (TUMS) 500 MG chewable tablet, Chew 1 tablet (200 mg of elemental calcium total) by mouth 2 (two) times daily., Disp: 120 tablet, Rfl: 4 .  cephALEXin (KEFLEX) 500 MG capsule, Take 1 capsule (500 mg total) by mouth 4 (four) times daily for 5 days., Disp: 20 capsule, Rfl: 0 .  HYDROcodone-acetaminophen (NORCO) 5-325 MG tablet, Take 1 tablet by mouth every 6 (six) hours as needed for moderate pain., Disp: 30 tablet, Rfl: 0 .  ondansetron (ZOFRAN) 4 MG tablet, Take 1 tablet (4 mg total) by mouth every 8 (eight) hours as needed for up to 5 days., Disp: 15 tablet, Rfl: 0   Objective:   Vitals:   07/31/18 0923  BP: (!) 154/64  Pulse: 83  Temp: 98.7 F (37.1 C)  SpO2: 91%    Physical Exam Vitals signs and nursing note reviewed.  Constitutional:      Appearance: Normal appearance.  HENT:     Head: Normocephalic and atraumatic.     Nose: Nose normal.     Mouth/Throat:     Mouth: Mucous membranes are moist.  Cardiovascular:     Rate and Rhythm: Normal rate.  Pulmonary:     Effort: Pulmonary effort is normal.  Abdominal:     General: Abdomen is flat. There is no distension.  Skin:    General: Skin is warm.  Neurological:     Mental Status: She is alert.  Psychiatric:        Mood and Affect: Mood normal.        Thought Content: Thought content normal.        Judgment: Judgment normal.     Assessment & Plan:  Acquired absence of breast and nipple, bilateral  History of right breast cancer   Plan for removal/excision of excess breast tissue bilaterally at the medial and lateral area of both breasts.  Port-A-Cath removal.  Excision of a gluteal skin lesion.  I explained that I will remove as much as I can but there is a limit to the amount that can be removed from the lateral aspect due to positioning.  The risks that can be encountered with and after excision of skin and tissue were discussed and include the following but not limited to  these: bleeding, infection, delayed healing, anesthesia risks, skin sensation changes, injury to structures including nerves, blood vessels, and muscles which may be temporary or permanent, allergies to tape, suture materials and glues, blood products, topical preparations or injected agents, skin contour irregularities, skin discoloration and swelling, deep vein thrombosis, cardiac and pulmonary complications, pain, which may persist, persistent pain, recurrence of the lesion, poor healing of the incision, possible need for revisional surgery or staged procedures.  Prescription sent to her pharmacy.  [place], DO

## 2018-07-31 NOTE — Telephone Encounter (Unsigned)
Copied from [place] [id number]. Topic: Quick Communication - Lab Results (Clinic Use ONLY) >> Jul 23, 2018 11:47 AM Lamarr Lulas, CMA wrote: Called patient to inform them of  lab results. When patient returns call, triage nurse may disclose results.

## 2018-08-05 ENCOUNTER — Other Ambulatory Visit: Payer: Self-pay

## 2018-08-05 ENCOUNTER — Encounter (HOSPITAL_BASED_OUTPATIENT_CLINIC_OR_DEPARTMENT_OTHER): Payer: Self-pay

## 2018-08-05 ENCOUNTER — Ambulatory Visit (HOSPITAL_BASED_OUTPATIENT_CLINIC_OR_DEPARTMENT_OTHER)
Admission: RE | Admit: 2018-08-05 | Discharge: 2018-08-05 | Disposition: A | Discharge status: Home / Self Care | Payer: Medicare Other | Attending: Plastic Surgery | Admitting: Plastic Surgery

## 2018-08-05 ENCOUNTER — Telehealth: Payer: Self-pay | Admitting: Internal Medicine

## 2018-08-05 ENCOUNTER — Ambulatory Visit (HOSPITAL_BASED_OUTPATIENT_CLINIC_OR_DEPARTMENT_OTHER): Payer: Medicare Other | Admitting: Anesthesiology

## 2018-08-05 ENCOUNTER — Encounter (HOSPITAL_BASED_OUTPATIENT_CLINIC_OR_DEPARTMENT_OTHER)
Admission: RE | Disposition: A | Discharge status: Home / Self Care | Payer: Self-pay | Source: Home / Self Care | Attending: Plastic Surgery

## 2018-08-05 DIAGNOSIS — Z853 Personal history of malignant neoplasm of breast: Secondary | ICD-10-CM | POA: Diagnosis not present

## 2018-08-05 DIAGNOSIS — Z452 Encounter for adjustment and management of vascular access device: Secondary | ICD-10-CM | POA: Insufficient documentation

## 2018-08-05 DIAGNOSIS — Z6841 Body Mass Index (BMI) 40.0 and over, adult: Secondary | ICD-10-CM | POA: Insufficient documentation

## 2018-08-05 DIAGNOSIS — C50811 Malignant neoplasm of overlapping sites of right female breast: Secondary | ICD-10-CM | POA: Diagnosis not present

## 2018-08-05 DIAGNOSIS — Z9221 Personal history of antineoplastic chemotherapy: Secondary | ICD-10-CM | POA: Insufficient documentation

## 2018-08-05 DIAGNOSIS — E039 Hypothyroidism, unspecified: Secondary | ICD-10-CM | POA: Diagnosis not present

## 2018-08-05 DIAGNOSIS — G4733 Obstructive sleep apnea (adult) (pediatric): Secondary | ICD-10-CM | POA: Diagnosis not present

## 2018-08-05 DIAGNOSIS — Z8673 Personal history of transient ischemic attack (TIA), and cerebral infarction without residual deficits: Secondary | ICD-10-CM | POA: Diagnosis not present

## 2018-08-05 DIAGNOSIS — G4730 Sleep apnea, unspecified: Secondary | ICD-10-CM | POA: Insufficient documentation

## 2018-08-05 DIAGNOSIS — I10 Essential (primary) hypertension: Secondary | ICD-10-CM | POA: Insufficient documentation

## 2018-08-05 DIAGNOSIS — Z170 Estrogen receptor positive status [ER+]: Secondary | ICD-10-CM | POA: Diagnosis not present

## 2018-08-05 HISTORY — PX: PORT-A-CATH REMOVAL: SHX5289

## 2018-08-05 SURGERY — REMOVAL PORT-A-CATH
Anesthesia: Monitor Anesthesia Care | Site: Chest | Laterality: Left

## 2018-08-05 MED ORDER — SODIUM CHLORIDE 0.9 % IV SOLN: 250.0000 mL | INTRAVENOUS | Status: DC | PRN

## 2018-08-05 MED ORDER — PROPOFOL 10 MG/ML IV BOLUS
INTRAVENOUS | Status: DC | PRN
  Administered 2018-08-05 (×2): 20 mg via INTRAVENOUS

## 2018-08-05 MED ORDER — FENTANYL CITRATE (PF) 100 MCG/2ML IJ SOLN
50.0000 ug | INTRAMUSCULAR | Status: DC | PRN
  Administered 2018-08-05: 50 ug via INTRAVENOUS

## 2018-08-05 MED ORDER — ACETAMINOPHEN 650 MG RE SUPP: 650.0000 mg | RECTAL | Status: DC | PRN

## 2018-08-05 MED ORDER — FENTANYL CITRATE (PF) 100 MCG/2ML IJ SOLN: 25.0000 ug | INTRAMUSCULAR | Status: DC | PRN

## 2018-08-05 MED ORDER — MIDAZOLAM HCL 2 MG/2ML IJ SOLN: 1.0000 mg | INTRAMUSCULAR | Status: DC | PRN

## 2018-08-05 MED ORDER — LIDOCAINE-EPINEPHRINE 1 %-1:100000 IJ SOLN
INTRAMUSCULAR | Status: DC | PRN
  Administered 2018-08-05: 9 mL

## 2018-08-05 MED ORDER — CEFAZOLIN SODIUM-DEXTROSE 2-4 GM/100ML-% IV SOLN
INTRAVENOUS | Status: AC
  Filled 2018-08-05: qty 100

## 2018-08-05 MED ORDER — LACTATED RINGERS IV SOLN: INTRAVENOUS | Status: DC

## 2018-08-05 MED ORDER — ONDANSETRON HCL 4 MG/2ML IJ SOLN
INTRAMUSCULAR | Status: DC | PRN
  Administered 2018-08-05: 4 mg via INTRAVENOUS

## 2018-08-05 MED ORDER — SODIUM CHLORIDE 0.9% FLUSH: 3.0000 mL | Freq: Two times a day (BID) | INTRAVENOUS | Status: DC

## 2018-08-05 MED ORDER — ACETAMINOPHEN 325 MG PO TABS: 650.0000 mg | ORAL_TABLET | ORAL | Status: DC | PRN

## 2018-08-05 MED ORDER — CEFAZOLIN SODIUM-DEXTROSE 2-4 GM/100ML-% IV SOLN
2.0000 g | INTRAVENOUS | Status: AC
  Administered 2018-08-05: 2 g via INTRAVENOUS

## 2018-08-05 MED ORDER — OXYCODONE HCL 5 MG PO TABS: 5.0000 mg | ORAL_TABLET | ORAL | Status: DC | PRN

## 2018-08-05 MED ORDER — ONDANSETRON HCL 4 MG/2ML IJ SOLN: 4.0000 mg | Freq: Once | INTRAMUSCULAR | Status: DC | PRN

## 2018-08-05 MED ORDER — FENTANYL CITRATE (PF) 100 MCG/2ML IJ SOLN
INTRAMUSCULAR | Status: AC
  Filled 2018-08-05: qty 2

## 2018-08-05 MED ORDER — SODIUM CHLORIDE 0.9% FLUSH: 3.0000 mL | INTRAVENOUS | Status: DC | PRN

## 2018-08-05 MED ORDER — MIDAZOLAM HCL 2 MG/2ML IJ SOLN
INTRAMUSCULAR | Status: AC
  Filled 2018-08-05: qty 2

## 2018-08-05 MED ORDER — CHLORHEXIDINE GLUCONATE CLOTH 2 % EX PADS: 6.0000 | MEDICATED_PAD | Freq: Once | CUTANEOUS | Status: DC

## 2018-08-05 MED ORDER — SCOPOLAMINE 1 MG/3DAYS TD PT72: 1.0000 | MEDICATED_PATCH | Freq: Once | TRANSDERMAL | Status: DC | PRN

## 2018-08-05 SURGICAL SUPPLY — 42 items
ADH SKN CLS APL DERMABOND .7 (GAUZE/BANDAGES/DRESSINGS)
BLADE CLIPPER SURG (BLADE) IMPLANT
BLADE HEX COATED 2.75 (ELECTRODE) ×3 IMPLANT
BLADE SURG 15 STRL LF DISP TIS (BLADE) ×1 IMPLANT
BLADE SURG 15 STRL SS (BLADE) ×3
CLOSURE WOUND 1/2 X4 (GAUZE/BANDAGES/DRESSINGS)
COVER BACK TABLE 60X90IN (DRAPES) ×3 IMPLANT
COVER MAYO STAND STRL (DRAPES) ×3 IMPLANT
COVER WAND RF STERILE (DRAPES) IMPLANT
DERMABOND ADVANCED (GAUZE/BANDAGES/DRESSINGS)
DERMABOND ADVANCED .7 DNX12 (GAUZE/BANDAGES/DRESSINGS) IMPLANT
DRAPE LAPAROTOMY 100X72 PEDS (DRAPES) ×3 IMPLANT
DRSG TEGADERM 2-3/8X2-3/4 SM (GAUZE/BANDAGES/DRESSINGS) ×2 IMPLANT
ELECT COATED BLADE 2.86 ST (ELECTRODE) IMPLANT
ELECT REM PT RETURN 9FT ADLT (ELECTROSURGICAL) ×3
ELECTRODE REM PT RTRN 9FT ADLT (ELECTROSURGICAL) ×1 IMPLANT
GLOVE BIO SURGEON STRL SZ 6.5 (GLOVE) ×2 IMPLANT
GLOVE BIO SURGEONS STRL SZ 6.5 (GLOVE)
GLOVE EXAM NITRILE MD LF STRL (GLOVE) ×2 IMPLANT
GLOVE SURG SS PI 6.5 STRL IVOR (GLOVE) ×4 IMPLANT
GLOVE SURG SS PI 7.0 STRL IVOR (GLOVE) ×2 IMPLANT
GOWN STRL REUS W/ TWL LRG LVL3 (GOWN DISPOSABLE) ×2 IMPLANT
GOWN STRL REUS W/ TWL XL LVL3 (GOWN DISPOSABLE) IMPLANT
GOWN STRL REUS W/TWL LRG LVL3 (GOWN DISPOSABLE) ×6
GOWN STRL REUS W/TWL XL LVL3 (GOWN DISPOSABLE) ×3
NDL HYPO 25X1 1.5 SAFETY (NEEDLE) ×1 IMPLANT
NEEDLE HYPO 25X1 1.5 SAFETY (NEEDLE) ×3 IMPLANT
NS IRRIG 1000ML POUR BTL (IV SOLUTION) IMPLANT
PACK BASIN DAY SURGERY FS (CUSTOM PROCEDURE TRAY) ×3 IMPLANT
PENCIL BUTTON HOLSTER BLD 10FT (ELECTRODE) ×3 IMPLANT
SPONGE GAUZE 2X2 8PLY STER LF (GAUZE/BANDAGES/DRESSINGS)
SPONGE GAUZE 2X2 8PLY STRL LF (GAUZE/BANDAGES/DRESSINGS) IMPLANT
STRIP CLOSURE SKIN 1/2X4 (GAUZE/BANDAGES/DRESSINGS) IMPLANT
SUT MNCRL AB 4-0 PS2 18 (SUTURE) ×3 IMPLANT
SUT MON AB 3-0 SH 27 (SUTURE)
SUT MON AB 3-0 SH27 (SUTURE) IMPLANT
SUT MON AB 5-0 PS2 18 (SUTURE) ×1 IMPLANT
SUT VIC AB 3-0 SH 27 (SUTURE) ×2
SUT VIC AB 3-0 SH 27X BRD (SUTURE) IMPLANT
SUT VICRYL 4-0 PS2 18IN ABS (SUTURE) IMPLANT
SYR CONTROL 10ML LL (SYRINGE) ×3 IMPLANT
TRAY DSU PREP LF (CUSTOM PROCEDURE TRAY) ×1 IMPLANT

## 2018-08-05 NOTE — Interval H&P Note (Signed)
History and Physical Interval Note:  08/05/2018 10:39 AM  Jasmine Hood  has presented today for surgery, with the diagnosis of History of right breast cancer, Acquired absence of breast and nipple, bilateral.  The various methods of treatment have been discussed with the patient and family. After consideration of risks, benefits and other options for treatment, the patient has consented to  Procedure(s): REMOVAL PORT-A-CATH (N/A) as a surgical intervention.  The patient's history has been reviewed, patient examined, no change in status, stable for surgery.  I have reviewed the patient's chart and labs.  Questions were answered to the patient's satisfaction.     Loel Lofty Ronan Dion

## 2018-08-05 NOTE — Transfer of Care (Signed)
Immediate Anesthesia Transfer of Care Note  Patient: Jasmine Hood  Procedure(s) Performed: REMOVAL PORT-A-CATH (Left Chest)  Patient Location: PACU  Anesthesia Type:MAC  Level of Consciousness: awake, alert , oriented and patient cooperative  Airway & Oxygen Therapy: Patient Spontanous Breathing and Patient connected to face mask oxygen  Post-op Assessment: Report given to RN and Post -op Vital signs reviewed and stable  Post vital signs: Reviewed and stable  Last Vitals:  Vitals Value Taken Time  BP 148/117 08/05/2018 12:31 PM  Temp    Pulse 81 08/05/2018 12:32 PM  Resp    SpO2 97 % 08/05/2018 12:32 PM  Vitals shown include unvalidated device data.  Last Pain:  Vitals:   08/05/18 1125  TempSrc: Oral  PainSc: 0-No pain      Patients Stated Pain Goal: 4 (70/17/79 3903)  Complications: No apparent anesthesia complications

## 2018-08-05 NOTE — Op Note (Addendum)
DATE OF OPERATION: 08/05/2018  LOCATION: [facility]  PREOPERATIVE DIAGNOSIS: s/p chemotherapy, port-a-cath in place  POSTOPERATIVE DIAGNOSIS: Same  PROCEDURE: removal of port-a-cath  SURGEON: Claire Sanger Dillingham, DO  EBL: none  CONDITION: Stable  COMPLICATIONS: None  INDICATION: The patient, Jasmine Hood, is a 73 y.o. female born on December 27, 1945, is here for treatment and removal of port-a-cath removal after finishing her chemotherapy.   PROCEDURE DETAILS:  The patient was seen prior to surgery and marked.  The IV antibiotics were given. The patient was taken to the operating room and given an anesthetic. A standard time out was performed and all information was confirmed by those in the room. SCDs were placed.   The port-a-cath site was prepped and draped.  Local was injected at the previous incision site.  The #15 blade was used to make an incision at the previous incision site.  The scissors and bovie were used to dissect to the port-a-cath.  The sutures were cut and the port pulled out of the pocket with the catheter inplace.  A 3-0 Vicryl was used to place around the catheter as a purse string.  The patient was placed in trendelenberg.  The vicryl was tightened as the catheter was removed.  The stitch was secured.  The deep layers were closed with the 4-0 Monocryl followed by the 4-0 Monocryl to close the skin.  Derma bond was applied. The patient was allowed to wake up and taken to recovery room in stable condition at the end of the case. The family was notified at the end of the case.

## 2018-08-05 NOTE — Discharge Instructions (Signed)
May shower tomorrow No heavy lifting   Post Anesthesia Home Care Instructions  Activity: Get plenty of rest for the remainder of the day. A responsible individual must stay with you for 24 hours following the procedure.  For the next 24 hours, DO NOT: -Drive a car -Paediatric nurse -Drink alcoholic beverages -Take any medication unless instructed by your physician -Make any legal decisions or sign important papers.  Meals: Start with liquid foods such as gelatin or soup. Progress to regular foods as tolerated. Avoid greasy, spicy, heavy foods. If nausea and/or vomiting occur, drink only clear liquids until the nausea and/or vomiting subsides. Call your physician if vomiting continues.  Special Instructions/Symptoms: Your throat may feel dry or sore from the anesthesia or the breathing tube placed in your throat during surgery. If this causes discomfort, gargle with warm salt water. The discomfort should disappear within 24 hours.  If you had a scopolamine patch placed behind your ear for the management of post- operative nausea and/or vomiting:  1. The medication in the patch is effective for 72 hours, after which it should be removed.  Wrap patch in a tissue and discard in the trash. Wash hands thoroughly with soap and water. 2. You may remove the patch earlier than 72 hours if you experience unpleasant side effects which may include dry mouth, dizziness or visual disturbances. 3. Avoid touching the patch. Wash your hands with soap and water after contact with the patch.

## 2018-08-05 NOTE — Anesthesia Preprocedure Evaluation (Addendum)
Anesthesia Evaluation  Patient identified by MRN, date of birth, ID band Patient awake    Reviewed: Allergy & Precautions, NPO status , Patient's Chart, lab work & pertinent test results  History of Anesthesia Complications (+) PROLONGED EMERGENCE and history of anesthetic complications  Airway Mallampati: II  TM Distance: <3 FB Neck ROM: Full    Dental  (+) Teeth Intact, Dental Advisory Given   Pulmonary sleep apnea ,    Pulmonary exam normal breath sounds clear to auscultation       Cardiovascular hypertension, Normal cardiovascular exam Rhythm:Regular Rate:Normal     Neuro/Psych CVA, No Residual Symptoms negative psych ROS   GI/Hepatic negative GI ROS, Neg liver ROS,   Endo/Other  Hypothyroidism Morbid obesity  Renal/GU negative Renal ROS     Musculoskeletal negative musculoskeletal ROS (+)   Abdominal   Peds  Hematology negative hematology ROS (+)   Anesthesia Other Findings Day of surgery medications reviewed with the patient.  Right breast cancer  Reproductive/Obstetrics                            Anesthesia Physical Anesthesia Plan  ASA: III  Anesthesia Plan: MAC   Post-op Pain Management:    Induction: Intravenous  PONV Risk Score and Plan: 2 and Propofol infusion, Treatment may vary due to age or medical condition and Ondansetron  Airway Management Planned: Natural Airway and Simple Face Mask  Additional Equipment:   Intra-op Plan:   Post-operative Plan:   Informed Consent: I have reviewed the patients History and Physical, chart, labs and discussed the procedure including the risks, benefits and alternatives for the proposed anesthesia with the patient or authorized representative who has indicated his/her understanding and acceptance.     Dental advisory given  Plan Discussed with: CRNA and Anesthesiologist  Anesthesia Plan Comments:          Anesthesia Quick Evaluation

## 2018-08-05 NOTE — Anesthesia Postprocedure Evaluation (Signed)
Anesthesia Post Note  Patient: Jasmine Hood  Procedure(s) Performed: REMOVAL PORT-A-CATH (Left Chest)     Patient location during evaluation: PACU Anesthesia Type: MAC Level of consciousness: awake and alert, awake and oriented Pain management: pain level controlled Vital Signs Assessment: post-procedure vital signs reviewed and stable Respiratory status: spontaneous breathing, nonlabored ventilation and respiratory function stable Cardiovascular status: stable and blood pressure returned to baseline Postop Assessment: no apparent nausea or vomiting Anesthetic complications: no    Last Vitals:  Vitals:   08/05/18 1245 08/05/18 1333  BP: (!) 144/74 (!) 146/71  Pulse: 72 71  Resp: (!) 24 20  Temp:  36.7 C  SpO2: 98% 96%    Last Pain:  Vitals:   08/05/18 1333  TempSrc: Oral  PainSc: 0-No pain                 Catalina Gravel

## 2018-08-05 NOTE — Telephone Encounter (Signed)
Pt given lab results per notes of Dr Jerilee Hoh on 07/22/2018. Pt verbalized understanding.

## 2018-08-06 ENCOUNTER — Encounter: Payer: Self-pay | Admitting: *Deleted

## 2018-08-07 ENCOUNTER — Encounter (HOSPITAL_BASED_OUTPATIENT_CLINIC_OR_DEPARTMENT_OTHER): Payer: Self-pay | Admitting: Plastic Surgery

## 2018-08-14 ENCOUNTER — Ambulatory Visit (INDEPENDENT_AMBULATORY_CARE_PROVIDER_SITE_OTHER): Payer: Medicare Other | Admitting: Plastic Surgery

## 2018-08-14 ENCOUNTER — Other Ambulatory Visit: Payer: Self-pay

## 2018-08-14 ENCOUNTER — Encounter: Payer: Self-pay | Admitting: Plastic Surgery

## 2018-08-14 VITALS — HR 89 | Temp 98.7°F | Ht 59.0 in | Wt 220.2 lb

## 2018-08-14 DIAGNOSIS — Z9013 Acquired absence of bilateral breasts and nipples: Secondary | ICD-10-CM

## 2018-08-14 DIAGNOSIS — Z853 Personal history of malignant neoplasm of breast: Secondary | ICD-10-CM

## 2018-08-14 NOTE — Progress Notes (Signed)
The patient is a 73 year old female here for follow-up after undergoing removal of Port-A-Cath.  There were no complications.  She is doing well.  Incisions healing well.  No sign of infection.  We will get her scheduled for the excision of excess breast tissue as soon as the coronavirus is cleared.

## 2018-09-14 ENCOUNTER — Ambulatory Visit (INDEPENDENT_AMBULATORY_CARE_PROVIDER_SITE_OTHER): Payer: Medicare Other | Admitting: Internal Medicine

## 2018-09-14 ENCOUNTER — Institutional Professional Consult (permissible substitution): Payer: Self-pay | Admitting: Internal Medicine

## 2018-09-14 ENCOUNTER — Other Ambulatory Visit: Payer: Self-pay

## 2018-09-14 ENCOUNTER — Encounter: Payer: Self-pay | Admitting: Internal Medicine

## 2018-09-14 VITALS — BP 110/70 | HR 86 | Ht 59.0 in | Wt 222.4 lb

## 2018-09-14 DIAGNOSIS — C50911 Malignant neoplasm of unspecified site of right female breast: Secondary | ICD-10-CM | POA: Diagnosis not present

## 2018-09-14 DIAGNOSIS — G4733 Obstructive sleep apnea (adult) (pediatric): Secondary | ICD-10-CM | POA: Diagnosis not present

## 2018-09-14 DIAGNOSIS — Z170 Estrogen receptor positive status [ER+]: Secondary | ICD-10-CM

## 2018-09-14 DIAGNOSIS — Z6841 Body Mass Index (BMI) 40.0 and over, adult: Secondary | ICD-10-CM | POA: Diagnosis not present

## 2018-09-14 DIAGNOSIS — C50811 Malignant neoplasm of overlapping sites of right female breast: Secondary | ICD-10-CM | POA: Diagnosis not present

## 2018-09-14 NOTE — Assessment & Plan Note (Signed)
History and exam consistent with medically significant OSA, Basics of sleep hygiene, sleep apnea, medial concerns, responsibility for safe driving, treatment options reviewed.  Plan- HST then likely start with CPAP if appropriate.

## 2018-09-14 NOTE — Assessment & Plan Note (Signed)
She is actively managed by Oncology for this problem.

## 2018-09-14 NOTE — Assessment & Plan Note (Signed)
Potential benefit of weight loss emphasized

## 2018-09-14 NOTE — Patient Instructions (Signed)
Order- Schedule unattended home sleep test dx OSA  Please call us for results and recommendations about 2 weeks after your sleep test. If appropriate, we may be able to start treatment before we see you back.  Please call as needed if you have questions.

## 2018-09-14 NOTE — Progress Notes (Signed)
09/14/2018- 73 yoF never smoker for sleep evaluation, Referred courtesy of Dr Deniece Ree. Medical problem list includes OSA, HBP, Cirrhosis of liver, R Breast Cancer/ Bilateral mastectomy/ Bone mets, Morbid Obesity -----Pt wakes up in the middle of the night twice, toss and turn. Pt wakes up not rested and tired. Widowed, living with daughter. Aware for many years of loud snoring, but doesn't know of witnessed apneas. Occasional naps. No sleep meds or caffeine.  Had sleep study at [facility] in 1989- never treated and results no longer available. Denies ENT surgery, heart or lung disease. Hypothyroidism in past, said to self-correct.  Recent bilateral mastectomy revision, R breast XRT.   Epworth score endorsed by patient as "0"  Prior to Admission medications   Medication Sig Start Date End Date Taking? Authorizing Provider  anastrozole (ARIMIDEX) 1 MG tablet Take 1 tablet (1 mg total) by mouth daily. 06/18/18  Yes Magrinat, Virgie Dad, MD  calcium carbonate (TUMS) 500 MG chewable tablet Chew 1 tablet (200 mg of elemental calcium total) by mouth 2 (two) times daily. 05/21/18  Yes Magrinat, Virgie Dad, MD  HYDROcodone-acetaminophen (NORCO) 5-325 MG tablet Take 1 tablet by mouth every 6 (six) hours as needed for moderate pain. 07/31/18  Yes Dillingham, Loel Lofty, DO   Past Medical History:  Diagnosis Date  . Breast cancer ([place]) 10/07/2016   right breast, spine  . Brown recluse spider bite    right leg  . COLONIC POLYPS, HX OF 01/05/2009   Qualifier: Diagnosis of  By: [name]  MD, [name]   . Complication of anesthesia    slow to awaken after wisdom  teeth extraction age 73  . Constipation   . Genetic testing 11/27/2016   Ms. Hattabaugh underwent genetic counseling and testing for hereditary cancer syndromes on 11/19/2016. Her results were negative for mutations in all 46 genes analyzed by [facility]'s 46-gene Common Hereditary Cancers Panel. Genes analyzed include: APC, ATM, AXIN2, BARD1, BMPR1A, BRCA1, BRCA2, BRIP1,  CDH1, CDKN2A, CHEK2, CTNNA1, DICER1, EPCAM, GREM1, HOXB13, KIT, MEN1, MLH1, MSH2, MSH3, MSH6, MUTYH, NBN,  . HYPOTHYROIDISM 02/01/2010   Qualifier: Diagnosis of  By: [name]  MD, [name]   . Morbid obesity ([place]) 01/05/2009   Qualifier: Diagnosis of  By: [name]  MD, [name]   . Pneumonia 1987  . Sleep apnea    no cpap used   . Stroke [facility])    found on CT scan   Past Surgical History:  Procedure Laterality Date  . BILATERAL TOTAL MASTECTOMY WITH AXILLARY LYMPH NODE DISSECTION Bilateral 04/04/2017   Procedure: RIGHT MASTECTOMY WITH TARGETED RIGHT AXILLARY NODE DISSECTION AND LEFT PROPHYLATIC MASTECTOMY ERAS PATHWAY;  Surgeon: Alphonsa Overall, MD;  Location: [place];  Service: General;  Laterality: Bilateral;  PEC BLOCK  . BREAST BIOPSY Right 10/07/2016   invasive mammary carcinoma  . BREAST BIOPSY Left 10/08/2016   breast fibroadenoma no malignancy  . CATARACT EXTRACTION Bilateral   . COLONOSCOPY W/ BIOPSIES AND POLYPECTOMY  2012  . COLONOSCOPY WITH PROPOFOL N/A 12/22/2015   Procedure: COLONOSCOPY WITH PROPOFOL;  Surgeon: [name], MD;  [facility];  Service: Endoscopy;  Laterality: N/A;  . INCISION AND DRAINAGE Right 1998   surgery for spider bite on RLE; "[name]"  . MASTECTOMY Left 04/04/2017   [name]  . MASTECTOMY WITH AXILLARY LYMPH NODE DISSECTION Right 04/04/2017  . MOLE REMOVAL  10/29/2016   Procedure: MOLE REMOVAL LEFT CHEST;  Surgeon: Alphonsa Overall, MD;  [facility];  Service:  General;;  . PORT-A-CATH REMOVAL Left 08/05/2018   Procedure: REMOVAL PORT-A-CATH;  Surgeon: [name], DO;  Location: [place];  Service: Plastics;  Laterality: Left;  . PORTACATH PLACEMENT N/A 10/29/2016   Procedure: INSERTION PORT-A-CATH;  Surgeon: Alphonsa Overall, MD;  [facility];  Service: General;  Laterality: N/A;  . TUBAL LIGATION    . WISDOM TOOTH EXTRACTION  age 73   Family History  Problem Relation Age of Onset  . Breast  cancer Mother 6       d.52 from metastatic disease  . Throat cancer Father        d.65 history of smoking  . Rheum arthritis Sister        3 sisters pos. for osteo and RA  . Diabetes Maternal Aunt   . Cancer Maternal Grandfather        mouth cancer-chewed tobacco  . Breast cancer Cousin 85  . Breast cancer Daughter 56  . Stomach cancer Sister 31   Social History   Socioeconomic History  . Marital status: Widowed    Spouse name: Not on file  . Number of children: 5  . Years of education: Not on file  . Highest education level: Not on file  Occupational History  . Occupation: retired  Scientific laboratory technician  . Financial resource strain: Not on file  . Food insecurity:    Worry: Not on file    Inability: Not on file  . Transportation needs:    Medical: Not on file    Non-medical: Not on file  Tobacco Use  . Smoking status: Never Smoker  . Smokeless tobacco: Never Used  Substance and Sexual Activity  . Alcohol use: No  . Drug use: No  . Sexual activity: Not Currently  Lifestyle  . Physical activity:    Days per week: Not on file    Minutes per session: Not on file  . Stress: Not on file  Relationships  . Social connections:    Talks on phone: Not on file    Gets together: Not on file    Attends religious service: Not on file    Active member of club or organization: Not on file    Attends meetings of clubs or organizations: Not on file    Relationship status: Not on file  . Intimate partner violence:    Fear of current or ex partner: Not on file    Emotionally abused: Not on file    Physically abused: Not on file    Forced sexual activity: Not on file  Other Topics Concern  . Not on file  Social History Narrative  . Not on file   ROS-see HPI   + = positive Constitutional:    weight loss, night sweats, fevers, chills, fatigue, lassitude. HEENT:    headaches, difficulty swallowing, tooth/dental problems, sore throat,       sneezing, itching, ear ache, nasal  congestion, post nasal drip, snoring CV:    chest pain, orthopnea, PND, swelling in lower extremities, anasarca,                                  dizziness, palpitations Resp:   shortness of breath with exertion or at rest.                productive cough,   non-productive cough, coughing up of blood.  change in color of mucus.  wheezing.   Skin:    rash or lesions. GI:  No-   heartburn, indigestion, abdominal pain, nausea, vomiting, diarrhea,                 change in bowel habits, loss of appetite GU: dysuria, change in color of urine, no urgency or frequency.   flank pain. MS:   joint pain, stiffness, decreased range of motion, back pain. Neuro-     nothing unusual Psych:  change in mood or affect.  depression or anxiety.   memory loss.  OBJ- Physical Exam General- Alert, Oriented, Affect-appropriate, Distress- none acute, + obese Skin- rash-none, lesions- none, excoriation- none Lymphadenopathy- none Head- atraumatic            Eyes- Gross vision intact, PERRLA, conjunctivae and secretions clear            Ears- Hearing, canals-normal            Nose- Clear, no-Septal dev, mucus, polyps, erosion, perforation             Throat- Mallampati III , mucosa clear , drainage- none, tonsils- atrophic,  + few missing teeth Neck- flexible , trachea midline, no stridor , thyroid nl, carotid no bruit Chest - symmetrical excursion , unlabored           Heart/CV- RRR , no murmur , no gallop  , no rub, nl s1 s2                           - JVD- none , edema- none, stasis changes- none, varices- none           Lung- clear to P&A, wheeze- none, cough- none , dullness-none, rub- none           Chest wall- + bilateral mastectomy Abd-  Br/ Gen/ Rectal- Not done, not indicated Extrem- cyanosis- none, clubbing, none, atrophy- none, strength- nl Neuro- grossly intact to observation

## 2018-09-22 DIAGNOSIS — C50911 Malignant neoplasm of unspecified site of right female breast: Secondary | ICD-10-CM | POA: Diagnosis not present

## 2018-09-22 DIAGNOSIS — C50912 Malignant neoplasm of unspecified site of left female breast: Secondary | ICD-10-CM | POA: Diagnosis not present

## 2018-09-27 IMAGING — MR MR HEAD W/O CM
8 series · 48 of 48 positions shown · non-contrast
Comparison: CT HEAD February 19, 2017 and MRI total spine November 11, 2016

CLINICAL DATA: Confusion and difficulty walking. History of
metastatic breast cancer.

EXAM:
MRI HEAD WITHOUT CONTRAST
TECHNIQUE: Multiplanar, multiecho pulse sequences of the brain and surrounding
structures were obtained without intravenous contrast.

[Series 2: T1 · sagittal · 5.0mm · 0.45mm/px · 3 of 22 slices shown]
[im 1/22]
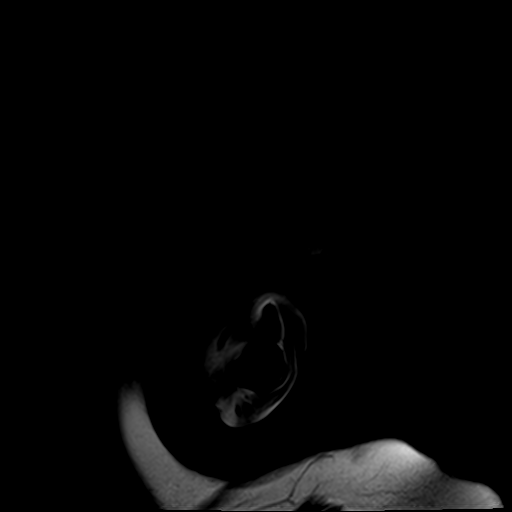
[im 11/22]
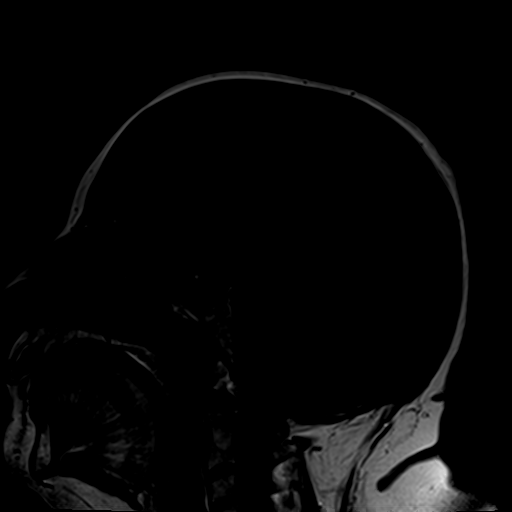
[im 22/22]
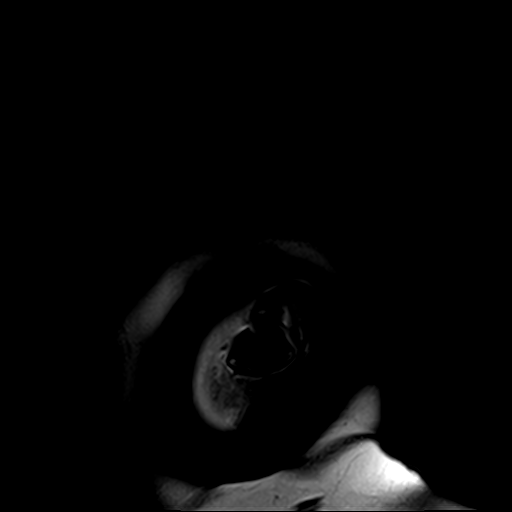

[Series 3: DWI · axial · 3.0mm · 1.80mm/px · z∈[-44,+100]mm · 12 of 100 slices shown (1 of 2)]
[im 1/100]
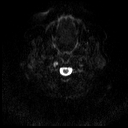
[im 10/100]
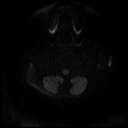
[im 19/100]
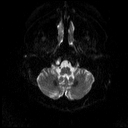
[im 28/100]
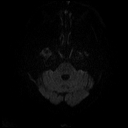
[im 37/100]
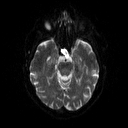
[im 46/100]
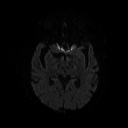
[im 55/100]
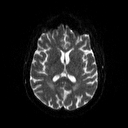
[im 64/100]
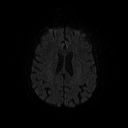
[im 73/100]
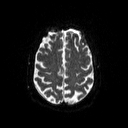
[im 82/100]
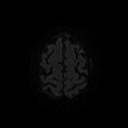
[im 91/100]
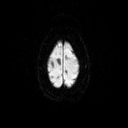
[im 100/100]
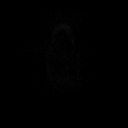

[Series 4: DWI · axial · 3.0mm · 1.80mm/px · z∈[-44,+100]mm · 5 of 49 slices shown (2 of 2)]
[im 1/49]
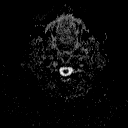
[im 13/49]
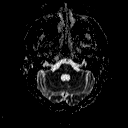
[im 25/49]
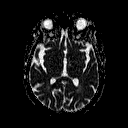
[im 37/49]
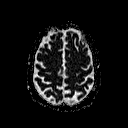
[im 49/49]
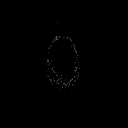

[Series 5: T2 · axial · 5.0mm · 0.51mm/px · z∈[-35,+108]mm · 2 of 22 slices shown (1 of 2)]
[im 1/22]
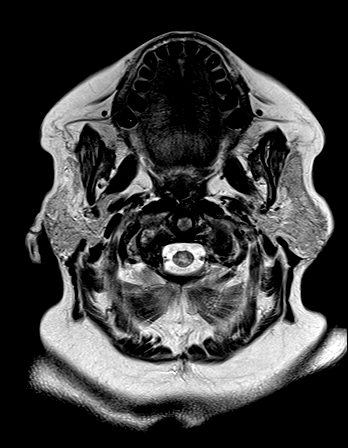
[im 22/22]
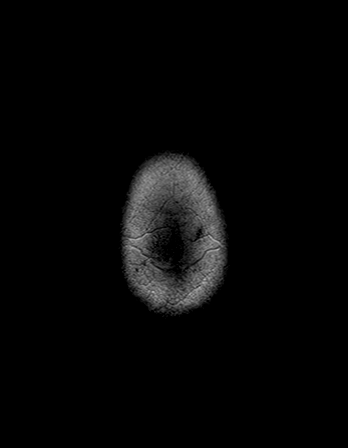

[Series 6: FLAIR · axial · 3.0mm · 0.45mm/px · z∈[-53,+99]mm · 4 of 34 slices shown]
[im 1/34]
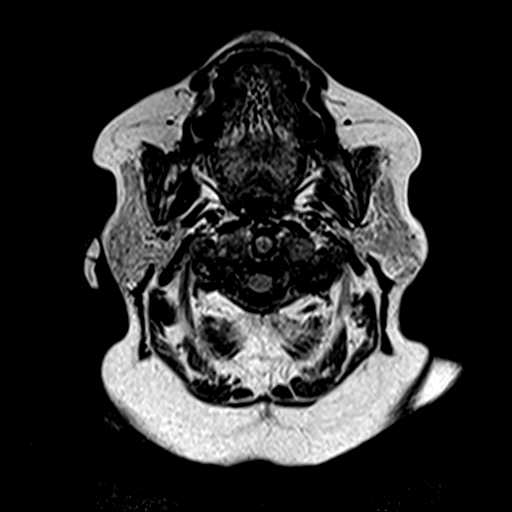
[im 12/34]
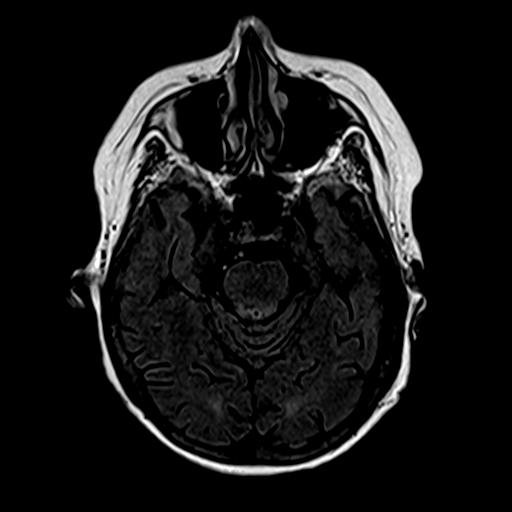
[im 23/34]
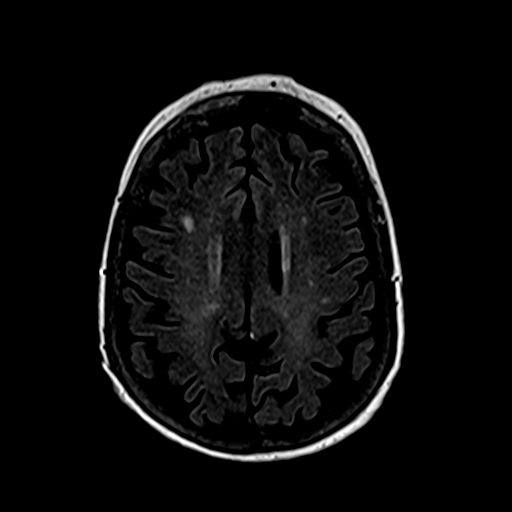
[im 34/34]
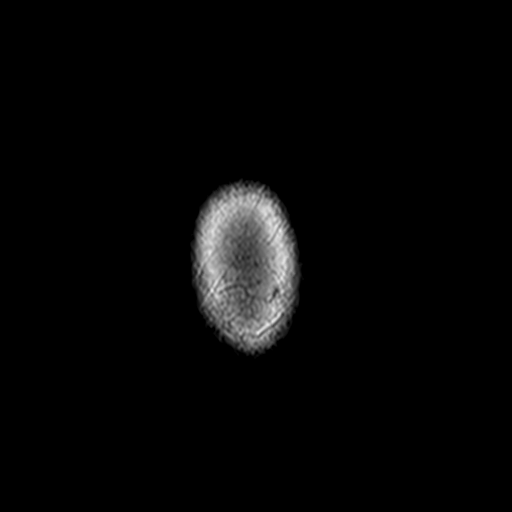

[Series 8: swi_images · axial · 5.0mm · 0.90mm/px · z∈[-44,+99]mm · 3 of 30 slices shown]
[im 1/30]
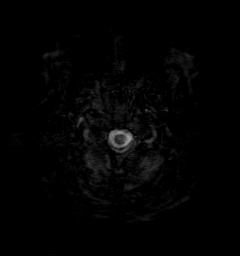
[im 15/30]
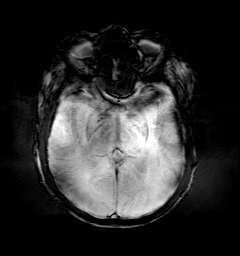
[im 30/30]
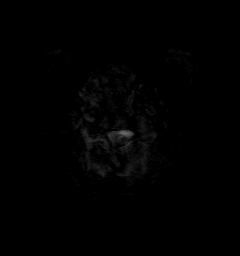

[Series 9: t1_mpr_tra · axial · 1.0mm · 0.71mm/px · z∈[-40,+100]mm · 16 of 144 slices shown]
[im 1/144]
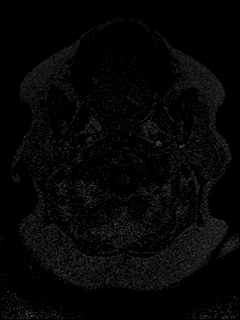
[im 10/144]
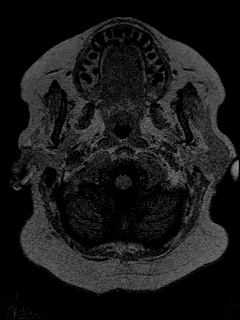
[im 20/144]
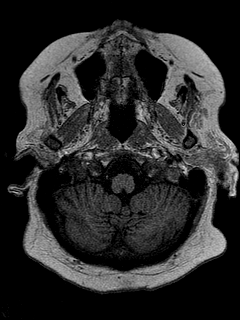
[im 29/144]
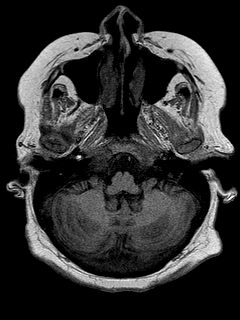
[im 39/144]
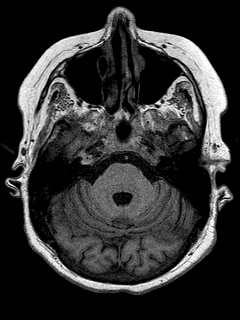
[im 48/144]
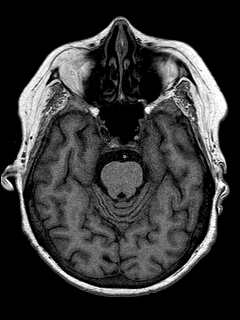
[im 58/144]
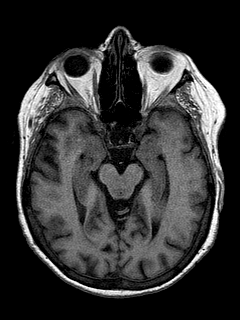
[im 67/144]
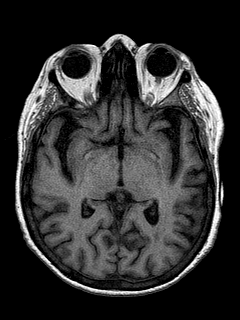
[im 77/144]
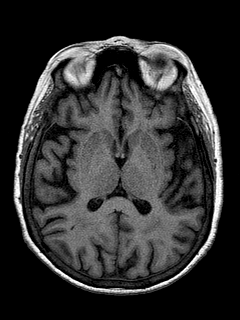
[im 86/144]
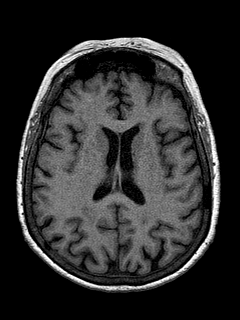
[im 96/144]
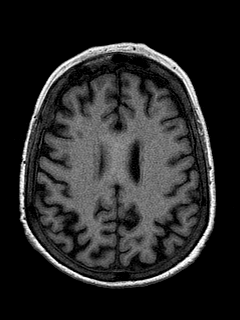
[im 105/144]
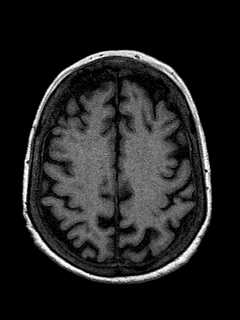
[im 115/144]
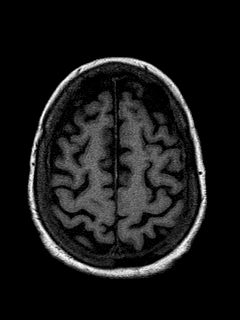
[im 124/144]
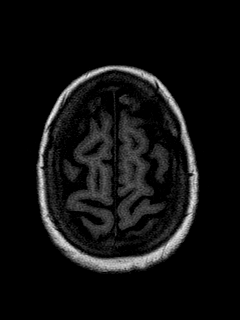
[im 134/144]
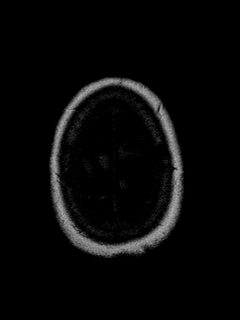
[im 144/144]
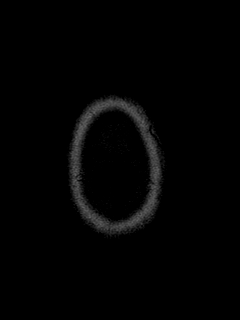

[Series 10: T2 · coronal · 5.0mm · 0.45mm/px · 3 of 27 slices shown (2 of 2)]
[im 1/27]
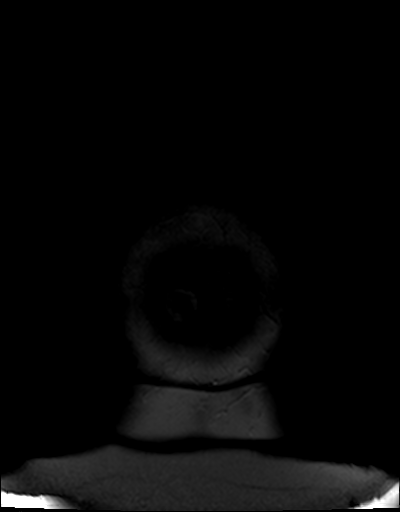
[im 14/27]
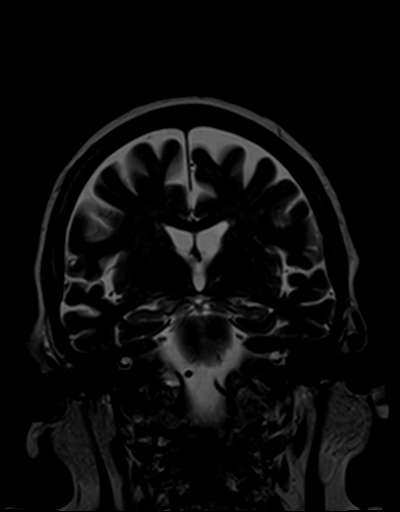
[im 27/27]
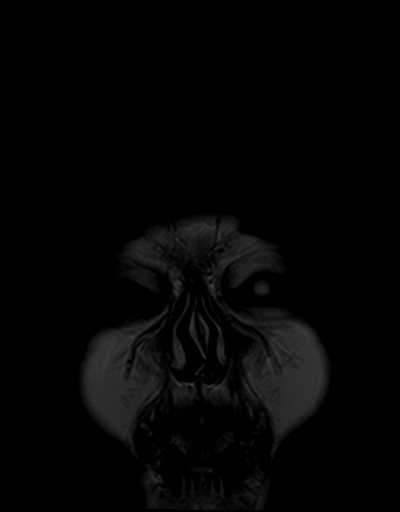

[48 of 48 positions shown; findings below may reference images not displayed]

FINDINGS: INTRACRANIAL CONTENTS: No reduced diffusion to suggest acute
ischemia. No susceptibility artifact to suggest hemorrhage. The
ventricles and sulci are normal for patient's age. Patchy
supratentorial white matter FLAIR T2 hyperintensities compatible
with mild to moderate chronic small vessel ischemic disease, normal
for age. No suspicious parenchymal signal, masses, mass effect. No
abnormal extra-axial fluid collections. No extra-axial masses.

VASCULAR: Normal major intracranial vascular flow voids present at
skull base.

SKULL AND UPPER CERVICAL SPINE: No abnormal sellar expansion.
Multiple low T1 signal lesions in the calvarium craniocervical
junction maintained.

SINUSES/ORBITS: The mastoid air-cells and included paranasal sinuses
are well-aerated.The included ocular globes and orbital contents are
non-suspicious. Status post bilateral ocular lens implants.

OTHER: None.
IMPRESSION: 1. Multiple calvarial metastasis.
2. Otherwise normal noncontrast MRI of the head for age.

## 2018-09-29 DIAGNOSIS — C50911 Malignant neoplasm of unspecified site of right female breast: Secondary | ICD-10-CM | POA: Diagnosis not present

## 2018-10-14 ENCOUNTER — Ambulatory Visit: Payer: Medicare Other

## 2018-10-20 ENCOUNTER — Ambulatory Visit (INDEPENDENT_AMBULATORY_CARE_PROVIDER_SITE_OTHER): Payer: Medicare Other | Admitting: Nurse Practitioner

## 2018-10-20 ENCOUNTER — Other Ambulatory Visit: Payer: Self-pay

## 2018-10-20 ENCOUNTER — Ambulatory Visit: Payer: Medicare Other

## 2018-10-20 ENCOUNTER — Encounter: Payer: Self-pay | Admitting: Nurse Practitioner

## 2018-10-20 VITALS — BP 168/86 | HR 93 | Temp 98.5°F | Ht 59.5 in | Wt 225.2 lb

## 2018-10-20 DIAGNOSIS — Z9013 Acquired absence of bilateral breasts and nipples: Secondary | ICD-10-CM

## 2018-10-20 NOTE — H&P (View-Only) (Signed)
History of Present Illness: Jasmine Hood is a 73 y.o.  female  with a history of morbid obesity, cirrhosis, estrogen positive right breast cancer and bilateral mastectomies. She has had radiation to the right breast and chemotherapy. Currently taking Arimidex. She had her port-a-cath removed on 08/05/18, without any complications. The incision has healed. She presents for preoperative evaluation for upcoming procedure, excision of bilateral excess breast tissue, scheduled for 10/28/18 with Dr. Marla Roe. She is not interested in reconstruction of her breast, but does not like the excess tissue medially and laterally. She denies any pain, but has trouble getting comfortable while sleeping. She does not use tobacco. Her daughter lives with her and will be assisting with her post-op care.   Past Medical History: Allergies: Allergies  Allergen Reactions  . Bee Venom Anaphylaxis  . Latex Swelling    Current Medications:  Current Outpatient Medications:  .  anastrozole (ARIMIDEX) 1 MG tablet, Take 1 tablet (1 mg total) by mouth daily., Disp: 90 tablet, Rfl: 3 .  calcium carbonate (TUMS) 500 MG chewable tablet, Chew 1 tablet (200 mg of elemental calcium total) by mouth 2 (two) times daily., Disp: 120 tablet, Rfl: 4 .  HYDROcodone-acetaminophen (NORCO) 5-325 MG tablet, Take 1 tablet by mouth every 6 (six) hours as needed for moderate pain., Disp: 30 tablet, Rfl: 0  Past Medical Problems: Past Medical History:  Diagnosis Date  . Breast cancer ([place]) 10/07/2016   right breast, spine  . Brown recluse spider bite    right leg  . COLONIC POLYPS, HX OF 01/05/2009   Qualifier: Diagnosis of  By: [name]  MD, [name]   . Complication of anesthesia    slow to awaken after wisdom  teeth extraction age 60  . Constipation   . Genetic testing 11/27/2016   Ms. Bearse underwent genetic counseling and testing for hereditary cancer syndromes on 11/19/2016. Her results were negative for mutations in all 46  genes analyzed by [facility]'s 46-gene Common Hereditary Cancers Panel. Genes analyzed include: APC, ATM, AXIN2, BARD1, BMPR1A, BRCA1, BRCA2, BRIP1, CDH1, CDKN2A, CHEK2, CTNNA1, DICER1, EPCAM, GREM1, HOXB13, KIT, MEN1, MLH1, MSH2, MSH3, MSH6, MUTYH, NBN,  . HYPOTHYROIDISM 02/01/2010   Qualifier: Diagnosis of  By: [name]  MD, [name]   . Morbid obesity ([place]) 01/05/2009   Qualifier: Diagnosis of  By: [name]  MD, [name]   . Pneumonia 1987  . Sleep apnea    no cpap used   . Stroke [facility])    found on CT scan    Past Surgical History: Past Surgical History:  Procedure Laterality Date  . BILATERAL TOTAL MASTECTOMY WITH AXILLARY LYMPH NODE DISSECTION Bilateral 04/04/2017   Procedure: RIGHT MASTECTOMY WITH TARGETED RIGHT AXILLARY NODE DISSECTION AND LEFT PROPHYLATIC MASTECTOMY ERAS PATHWAY;  Surgeon: Alphonsa Overall, MD;  Location: [place];  Service: General;  Laterality: Bilateral;  PEC BLOCK  . BREAST BIOPSY Right 10/07/2016   invasive mammary carcinoma  . BREAST BIOPSY Left 10/08/2016   breast fibroadenoma no malignancy  . CATARACT EXTRACTION Bilateral   . COLONOSCOPY W/ BIOPSIES AND POLYPECTOMY  2012  . COLONOSCOPY WITH PROPOFOL N/A 12/22/2015   Procedure: COLONOSCOPY WITH PROPOFOL;  Surgeon: [name], MD;  Location: [facility];  Service: Endoscopy;  Laterality: N/A;  . INCISION AND DRAINAGE Right 1998   surgery for spider bite on RLE; "[name]"  . MASTECTOMY Left 04/04/2017   [name]  . MASTECTOMY WITH AXILLARY LYMPH NODE DISSECTION Right 04/04/2017  . MOLE  REMOVAL  10/29/2016   Procedure: MOLE REMOVAL LEFT CHEST;  Surgeon: Alphonsa Overall, MD;  Location: [facility];  Service: General;;  . PORT-A-CATH REMOVAL Left 08/05/2018   Procedure: REMOVAL PORT-A-CATH;  Surgeon: [name], DO;  Location: [place];  Service: Plastics;  Laterality: Left;  . PORTACATH PLACEMENT N/A 10/29/2016   Procedure: INSERTION PORT-A-CATH;  Surgeon: Alphonsa Overall, MD;   Location: [facility];  Service: General;  Laterality: N/A;  . TUBAL LIGATION    . WISDOM TOOTH EXTRACTION  age 28    The patient has had anesthesia or sedation in the past.   The patient has NOT had problems with anesthesia.  The patient does not have a family history of anesthesia problems.    Social History: Social History   Socioeconomic History  . Marital status: Widowed    Spouse name: Not on file  . Number of children: 5  . Years of education: Not on file  . Highest education level: Not on file  Occupational History  . Occupation: retired  Scientific laboratory technician  . Financial resource strain: Not on file  . Food insecurity:    Worry: Not on file    Inability: Not on file  . Transportation needs:    Medical: Not on file    Non-medical: Not on file  Tobacco Use  . Smoking status: Never Smoker  . Smokeless tobacco: Never Used  Substance and Sexual Activity  . Alcohol use: No  . Drug use: No  . Sexual activity: Not Currently  Lifestyle  . Physical activity:    Days per week: Not on file    Minutes per session: Not on file  . Stress: Not on file  Relationships  . Social connections:    Talks on phone: Not on file    Gets together: Not on file    Attends religious service: Not on file    Active member of club or organization: Not on file    Attends meetings of clubs or organizations: Not on file    Relationship status: Not on file  . Intimate partner violence:    Fear of current or ex partner: Not on file    Emotionally abused: Not on file    Physically abused: Not on file    Forced sexual activity: Not on file  Other Topics Concern  . Not on file  Social History Narrative  . Not on file    Family History: Family History  Problem Relation Age of Onset  . Breast cancer Mother 48       d.52 from metastatic disease  . Throat cancer Father        d.65 history of smoking  . Rheum arthritis Sister        3 sisters pos. for osteo and RA  . Diabetes Maternal Aunt   .  Cancer Maternal Grandfather        mouth cancer-chewed tobacco  . Breast cancer Cousin 59  . Breast cancer Daughter 27  . Stomach cancer Sister 63    Review of Systems: General ROS: negative Dermatological ROS: excess skin Cardiovascular ROS: negative Lungs: denies SOB ENT ROS: negative Gastrointestinal ROS: negative  Physical Exam: Vital Signs There were no vitals taken for this visit.  General: alert, active, obese, no acute distress HEENT:normal Neck: supple, full ROM Chest: symmetrical rise and fall, healed incision to right upper chest Breast: excess skin and tissue medially and laterally bilaterally, skin discoloration and tightening on right lateral chest  distal to axilla Cardiac: regular rate and rhythm  Abdomen: obese, non-distended Musculoskeletal: MAEx4 Neuro: A&Ox3 Extremities: normal Skin: multiple healed scars on chest  Assessment: Iyah Laguna is a 73 yo female with a history of estrogen positive right breast cancer and bilateral mastectomies. She has excess tissue at the medial and lateral area of both breasts. She has a good amount of skin tightening on the right chest secondary to radiation.    Plan: Mirelle is scheduled for an excision of bilateral excess breast tissue, on 10/28/18 with Dr. Marla Roe.  Risks, benefits, and alternatives of procedure discussed, questions answered.   The risks that can be encountered with and after excision of skin and tissue were discussed and include the following but not limited to these: bleeding, infection, delayed healing, anesthesia risks, skin sensation changes, injury to structures including nerves, blood vessels, and muscles which may be temporary or permanent, allergies to tape, suture materials and glues, blood products, topical preparations or injected agents, skin contour irregularities, skin discoloration and swelling, deep vein thrombosis, cardiac and pulmonary complications, pain, which may persist, persistent pain,  recurrence of the lesion, poor healing of the incision, possible need for revisional surgery or staged procedures.   Electronically signed by: Alfredo Batty, NP 10/20/2018 2:53 PM

## 2018-10-20 NOTE — Progress Notes (Signed)
History of Present Illness: Jasmine Hood is a 73 y.o.  female  with a history of morbid obesity, cirrhosis, estrogen positive right breast cancer and bilateral mastectomies. She has had radiation to the right breast and chemotherapy. Currently taking Arimidex. She had her port-a-cath removed on 08/05/18, without any complications. The incision has healed. She presents for preoperative evaluation for upcoming procedure, excision of bilateral excess breast tissue, scheduled for 10/28/18 with Dr. Marla Roe. She is not interested in reconstruction of her breast, but does not like the excess tissue medially and laterally. She denies any pain, but has trouble getting comfortable while sleeping. She does not use tobacco. Her daughter lives with her and will be assisting with her post-op care.   Past Medical History: Allergies: Allergies  Allergen Reactions  . Bee Venom Anaphylaxis  . Latex Swelling    Current Medications:  Current Outpatient Medications:  .  anastrozole (ARIMIDEX) 1 MG tablet, Take 1 tablet (1 mg total) by mouth daily., Disp: 90 tablet, Rfl: 3 .  calcium carbonate (TUMS) 500 MG chewable tablet, Chew 1 tablet (200 mg of elemental calcium total) by mouth 2 (two) times daily., Disp: 120 tablet, Rfl: 4 .  HYDROcodone-acetaminophen (NORCO) 5-325 MG tablet, Take 1 tablet by mouth every 6 (six) hours as needed for moderate pain., Disp: 30 tablet, Rfl: 0  Past Medical Problems: Past Medical History:  Diagnosis Date  . Breast cancer ([place]) 10/07/2016   right breast, spine  . Brown recluse spider bite    right leg  . COLONIC POLYPS, HX OF 01/05/2009   Qualifier: Diagnosis of  By: [name]  MD, [name]   . Complication of anesthesia    slow to awaken after wisdom  teeth extraction age 60  . Constipation   . Genetic testing 11/27/2016   Ms. Bearse underwent genetic counseling and testing for hereditary cancer syndromes on 11/19/2016. Her results were negative for mutations in all 46  genes analyzed by [facility]'s 46-gene Common Hereditary Cancers Panel. Genes analyzed include: APC, ATM, AXIN2, BARD1, BMPR1A, BRCA1, BRCA2, BRIP1, CDH1, CDKN2A, CHEK2, CTNNA1, DICER1, EPCAM, GREM1, HOXB13, KIT, MEN1, MLH1, MSH2, MSH3, MSH6, MUTYH, NBN,  . HYPOTHYROIDISM 02/01/2010   Qualifier: Diagnosis of  By: [name]  MD, [name]   . Morbid obesity ([place]) 01/05/2009   Qualifier: Diagnosis of  By: [name]  MD, [name]   . Pneumonia 1987  . Sleep apnea    no cpap used   . Stroke [facility])    found on CT scan    Past Surgical History: Past Surgical History:  Procedure Laterality Date  . BILATERAL TOTAL MASTECTOMY WITH AXILLARY LYMPH NODE DISSECTION Bilateral 04/04/2017   Procedure: RIGHT MASTECTOMY WITH TARGETED RIGHT AXILLARY NODE DISSECTION AND LEFT PROPHYLATIC MASTECTOMY ERAS PATHWAY;  Surgeon: Alphonsa Overall, MD;  Location: [place];  Service: General;  Laterality: Bilateral;  PEC BLOCK  . BREAST BIOPSY Right 10/07/2016   invasive mammary carcinoma  . BREAST BIOPSY Left 10/08/2016   breast fibroadenoma no malignancy  . CATARACT EXTRACTION Bilateral   . COLONOSCOPY W/ BIOPSIES AND POLYPECTOMY  2012  . COLONOSCOPY WITH PROPOFOL N/A 12/22/2015   Procedure: COLONOSCOPY WITH PROPOFOL;  Surgeon: [name], MD;  Location: [facility];  Service: Endoscopy;  Laterality: N/A;  . INCISION AND DRAINAGE Right 1998   surgery for spider bite on RLE; "[name]"  . MASTECTOMY Left 04/04/2017   Jasmine Hood  . MASTECTOMY WITH AXILLARY LYMPH NODE DISSECTION Right 04/04/2017  . MOLE  REMOVAL  10/29/2016   Procedure: MOLE REMOVAL LEFT CHEST;  Surgeon: Alphonsa Overall, MD;  Location: [facility];  Service: General;;  . PORT-A-CATH REMOVAL Left 08/05/2018   Procedure: REMOVAL PORT-A-CATH;  Surgeon: [name], DO;  Location: [place];  Service: Plastics;  Laterality: Left;  . PORTACATH PLACEMENT N/A 10/29/2016   Procedure: INSERTION PORT-A-CATH;  Surgeon: Alphonsa Overall, MD;   Location: [facility];  Service: General;  Laterality: N/A;  . TUBAL LIGATION    . WISDOM TOOTH EXTRACTION  age 28    The patient has had anesthesia or sedation in the past.   The patient has NOT had problems with anesthesia.  The patient does not have a family history of anesthesia problems.    Social History: Social History   Socioeconomic History  . Marital status: Widowed    Spouse name: Not on file  . Number of children: 5  . Years of education: Not on file  . Highest education level: Not on file  Occupational History  . Occupation: retired  Scientific laboratory technician  . Financial resource strain: Not on file  . Food insecurity:    Worry: Not on file    Inability: Not on file  . Transportation needs:    Medical: Not on file    Non-medical: Not on file  Tobacco Use  . Smoking status: Never Smoker  . Smokeless tobacco: Never Used  Substance and Sexual Activity  . Alcohol use: No  . Drug use: No  . Sexual activity: Not Currently  Lifestyle  . Physical activity:    Days per week: Not on file    Minutes per session: Not on file  . Stress: Not on file  Relationships  . Social connections:    Talks on phone: Not on file    Gets together: Not on file    Attends religious service: Not on file    Active member of club or organization: Not on file    Attends meetings of clubs or organizations: Not on file    Relationship status: Not on file  . Intimate partner violence:    Fear of current or ex partner: Not on file    Emotionally abused: Not on file    Physically abused: Not on file    Forced sexual activity: Not on file  Other Topics Concern  . Not on file  Social History Narrative  . Not on file    Family History: Family History  Problem Relation Age of Onset  . Breast cancer Mother 48       d.52 from metastatic disease  . Throat cancer Father        d.65 history of smoking  . Rheum arthritis Sister        3 sisters pos. for osteo and RA  . Diabetes Maternal Aunt   .  Cancer Maternal Grandfather        mouth cancer-chewed tobacco  . Breast cancer Cousin 59  . Breast cancer Daughter 27  . Stomach cancer Sister 63    Review of Systems: General ROS: negative Dermatological ROS: excess skin Cardiovascular ROS: negative Lungs: denies SOB ENT ROS: negative Gastrointestinal ROS: negative  Physical Exam: Vital Signs There were no vitals taken for this visit.  General: alert, active, obese, no acute distress HEENT:normal Neck: supple, full ROM Chest: symmetrical rise and fall, healed incision to right upper chest Breast: excess skin and tissue medially and laterally bilaterally, skin discoloration and tightening on right lateral chest  distal to axilla Cardiac: regular rate and rhythm  Abdomen: obese, non-distended Musculoskeletal: MAEx4 Neuro: A&Ox3 Extremities: normal Skin: multiple healed scars on chest  Assessment: Jasmine Hood is a 73 yo female with a history of estrogen positive right breast cancer and bilateral mastectomies. She has excess tissue at the medial and lateral area of both breasts. She has a good amount of skin tightening on the right chest secondary to radiation.    Plan: Jasmine Hood is scheduled for an excision of bilateral excess breast tissue, on 10/28/18 with Dr. Marla Roe.  Risks, benefits, and alternatives of procedure discussed, questions answered.   The risks that can be encountered with and after excision of skin and tissue were discussed and include the following but not limited to these: bleeding, infection, delayed healing, anesthesia risks, skin sensation changes, injury to structures including nerves, blood vessels, and muscles which may be temporary or permanent, allergies to tape, suture materials and glues, blood products, topical preparations or injected agents, skin contour irregularities, skin discoloration and swelling, deep vein thrombosis, cardiac and pulmonary complications, pain, which may persist, persistent pain,  recurrence of the lesion, poor healing of the incision, possible need for revisional surgery or staged procedures.   Electronically signed by: Alfredo Batty, NP 10/20/2018 2:53 PM

## 2018-10-21 ENCOUNTER — Encounter (HOSPITAL_BASED_OUTPATIENT_CLINIC_OR_DEPARTMENT_OTHER): Payer: Self-pay | Admitting: *Deleted

## 2018-10-22 MED ORDER — HYDROCODONE-ACETAMINOPHEN 5-325 MG PO TABS: 1.0000 | ORAL_TABLET | Freq: Four times a day (QID) | ORAL | 0 refills | Status: AC | PRN

## 2018-10-22 NOTE — Addendum Note (Signed)
Addended by: [name] on: 10/22/2018 11:31 AM   Modules accepted: Orders

## 2018-10-26 ENCOUNTER — Other Ambulatory Visit: Payer: Self-pay

## 2018-10-26 ENCOUNTER — Other Ambulatory Visit (HOSPITAL_COMMUNITY)
Admission: RE | Admit: 2018-10-26 | Discharge: 2018-10-26 | Disposition: A | Discharge status: Home / Self Care | Payer: Medicare Other | Source: Ambulatory Visit | Attending: Plastic Surgery | Admitting: Plastic Surgery

## 2018-10-26 DIAGNOSIS — Z1159 Encounter for screening for other viral diseases: Secondary | ICD-10-CM | POA: Insufficient documentation

## 2018-10-26 LAB — SARS CORONAVIRUS 2 BY RT PCR (HOSPITAL ORDER, PERFORMED IN ~~LOC~~ HOSPITAL LAB): SARS Coronavirus 2: NEGATIVE

## 2018-10-27 NOTE — Anesthesia Preprocedure Evaluation (Signed)
Anesthesia Evaluation  Patient identified by MRN, date of birth, ID band Patient awake    Reviewed: Allergy & Precautions, NPO status , Patient's Chart, lab work & pertinent test results  History of Anesthesia Complications (+) PROLONGED EMERGENCE and history of anesthetic complications  Airway Mallampati: II  TM Distance: <3 FB Neck ROM: Full    Dental  (+) Teeth Intact, Dental Advisory Given   Pulmonary sleep apnea ,    Pulmonary exam normal breath sounds clear to auscultation       Cardiovascular hypertension, Normal cardiovascular exam Rhythm:Regular Rate:Normal     Neuro/Psych CVA, No Residual Symptoms negative psych ROS   GI/Hepatic negative GI ROS, Neg liver ROS,   Endo/Other  Hypothyroidism Morbid obesity  Renal/GU negative Renal ROS     Musculoskeletal negative musculoskeletal ROS (+)   Abdominal   Peds  Hematology negative hematology ROS (+)   Anesthesia Other Findings Day of surgery medications reviewed with the patient.    Reproductive/Obstetrics                             Anesthesia Physical  Anesthesia Plan  ASA: III  Anesthesia Plan: General   Post-op Pain Management:    Induction: Intravenous  PONV Risk Score and Plan: 3 and Propofol infusion, Treatment may vary due to age or medical condition and Ondansetron  Airway Management Planned: LMA and Oral ETT  Additional Equipment:   Intra-op Plan:   Post-operative Plan: Extubation in OR  Informed Consent: I have reviewed the patients History and Physical, chart, labs and discussed the procedure including the risks, benefits and alternatives for the proposed anesthesia with the patient or authorized representative who has indicated his/her understanding and acceptance.     Dental advisory given  Plan Discussed with: CRNA, Anesthesiologist and Surgeon  Anesthesia Plan Comments: (  )         Anesthesia Quick Evaluation

## 2018-10-28 ENCOUNTER — Telehealth: Payer: Self-pay | Admitting: Internal Medicine

## 2018-10-28 ENCOUNTER — Encounter (HOSPITAL_BASED_OUTPATIENT_CLINIC_OR_DEPARTMENT_OTHER): Payer: Self-pay | Admitting: Plastic Surgery

## 2018-10-28 ENCOUNTER — Other Ambulatory Visit: Payer: Self-pay

## 2018-10-28 ENCOUNTER — Encounter (HOSPITAL_BASED_OUTPATIENT_CLINIC_OR_DEPARTMENT_OTHER)
Admission: RE | Disposition: A | Discharge status: Home / Self Care | Payer: Self-pay | Source: Home / Self Care | Attending: Plastic Surgery

## 2018-10-28 ENCOUNTER — Ambulatory Visit (HOSPITAL_BASED_OUTPATIENT_CLINIC_OR_DEPARTMENT_OTHER)
Admission: RE | Admit: 2018-10-28 | Discharge: 2018-10-28 | Disposition: A | Discharge status: Home / Self Care | Payer: Medicare Other | Attending: Plastic Surgery | Admitting: Plastic Surgery

## 2018-10-28 ENCOUNTER — Ambulatory Visit (HOSPITAL_BASED_OUTPATIENT_CLINIC_OR_DEPARTMENT_OTHER): Payer: Medicare Other | Admitting: Anesthesiology

## 2018-10-28 DIAGNOSIS — Z800 Family history of malignant neoplasm of digestive organs: Secondary | ICD-10-CM | POA: Diagnosis not present

## 2018-10-28 DIAGNOSIS — Z8261 Family history of arthritis: Secondary | ICD-10-CM | POA: Insufficient documentation

## 2018-10-28 DIAGNOSIS — I10 Essential (primary) hypertension: Secondary | ICD-10-CM | POA: Insufficient documentation

## 2018-10-28 DIAGNOSIS — Z923 Personal history of irradiation: Secondary | ICD-10-CM | POA: Diagnosis not present

## 2018-10-28 DIAGNOSIS — Z833 Family history of diabetes mellitus: Secondary | ICD-10-CM | POA: Insufficient documentation

## 2018-10-28 DIAGNOSIS — Z79811 Long term (current) use of aromatase inhibitors: Secondary | ICD-10-CM | POA: Diagnosis not present

## 2018-10-28 DIAGNOSIS — Z86010 Personal history of colonic polyps: Secondary | ICD-10-CM | POA: Insufficient documentation

## 2018-10-28 DIAGNOSIS — Z6841 Body Mass Index (BMI) 40.0 and over, adult: Secondary | ICD-10-CM | POA: Diagnosis not present

## 2018-10-28 DIAGNOSIS — Z91040 Latex allergy status: Secondary | ICD-10-CM | POA: Diagnosis not present

## 2018-10-28 DIAGNOSIS — E039 Hypothyroidism, unspecified: Secondary | ICD-10-CM | POA: Diagnosis not present

## 2018-10-28 DIAGNOSIS — Z9841 Cataract extraction status, right eye: Secondary | ICD-10-CM | POA: Insufficient documentation

## 2018-10-28 DIAGNOSIS — Z803 Family history of malignant neoplasm of breast: Secondary | ICD-10-CM | POA: Insufficient documentation

## 2018-10-28 DIAGNOSIS — Z853 Personal history of malignant neoplasm of breast: Secondary | ICD-10-CM | POA: Insufficient documentation

## 2018-10-28 DIAGNOSIS — K7460 Unspecified cirrhosis of liver: Secondary | ICD-10-CM | POA: Diagnosis not present

## 2018-10-28 DIAGNOSIS — Z9842 Cataract extraction status, left eye: Secondary | ICD-10-CM | POA: Insufficient documentation

## 2018-10-28 DIAGNOSIS — Z8673 Personal history of transient ischemic attack (TIA), and cerebral infarction without residual deficits: Secondary | ICD-10-CM | POA: Diagnosis not present

## 2018-10-28 DIAGNOSIS — Z91030 Bee allergy status: Secondary | ICD-10-CM | POA: Insufficient documentation

## 2018-10-28 DIAGNOSIS — G4730 Sleep apnea, unspecified: Secondary | ICD-10-CM | POA: Insufficient documentation

## 2018-10-28 DIAGNOSIS — N641 Fat necrosis of breast: Secondary | ICD-10-CM | POA: Insufficient documentation

## 2018-10-28 DIAGNOSIS — I639 Cerebral infarction, unspecified: Secondary | ICD-10-CM | POA: Diagnosis not present

## 2018-10-28 DIAGNOSIS — N651 Disproportion of reconstructed breast: Secondary | ICD-10-CM | POA: Insufficient documentation

## 2018-10-28 DIAGNOSIS — Z9013 Acquired absence of bilateral breasts and nipples: Secondary | ICD-10-CM | POA: Diagnosis not present

## 2018-10-28 DIAGNOSIS — N650 Deformity of reconstructed breast: Secondary | ICD-10-CM | POA: Diagnosis not present

## 2018-10-28 HISTORY — PX: BREAST REDUCTION SURGERY: SHX8

## 2018-10-28 SURGERY — MAMMOPLASTY, REDUCTION
Anesthesia: General | Site: Breast | Laterality: Bilateral

## 2018-10-28 MED ORDER — CHLORHEXIDINE GLUCONATE 4 % EX LIQD: 1.0000 "application " | Freq: Once | CUTANEOUS | Status: DC

## 2018-10-28 MED ORDER — ACETAMINOPHEN 160 MG/5ML PO SOLN: 325.0000 mg | ORAL | Status: DC | PRN

## 2018-10-28 MED ORDER — ROCURONIUM BROMIDE 10 MG/ML (PF) SYRINGE
PREFILLED_SYRINGE | INTRAVENOUS | Status: AC
  Filled 2018-10-28: qty 10

## 2018-10-28 MED ORDER — ONDANSETRON HCL 4 MG/2ML IJ SOLN
4.0000 mg | Freq: Once | INTRAMUSCULAR | Status: AC | PRN
  Administered 2018-10-28: 4 mg via INTRAVENOUS

## 2018-10-28 MED ORDER — LACTATED RINGERS IV SOLN
INTRAVENOUS | Status: DC
  Administered 2018-10-28 (×2): via INTRAVENOUS

## 2018-10-28 MED ORDER — DEXAMETHASONE SODIUM PHOSPHATE 4 MG/ML IJ SOLN
INTRAMUSCULAR | Status: DC | PRN
  Administered 2018-10-28: 5 mg via INTRAVENOUS

## 2018-10-28 MED ORDER — LACTATED RINGERS IV SOLN
INTRAVENOUS | Status: AC | PRN
  Administered 2018-10-28: 1000 mL

## 2018-10-28 MED ORDER — LIDOCAINE HCL (PF) 1 % IJ SOLN
INTRAMUSCULAR | Status: AC
  Filled 2018-10-28: qty 30

## 2018-10-28 MED ORDER — SUGAMMADEX SODIUM 500 MG/5ML IV SOLN
INTRAVENOUS | Status: DC | PRN
  Administered 2018-10-28: 220 mg via INTRAVENOUS

## 2018-10-28 MED ORDER — PHENYLEPHRINE HCL (PRESSORS) 10 MG/ML IV SOLN
INTRAVENOUS | Status: DC | PRN
  Administered 2018-10-28: 40 ug via INTRAVENOUS

## 2018-10-28 MED ORDER — LIDOCAINE HCL 1 % IJ SOLN
INTRAMUSCULAR | Status: DC | PRN
  Administered 2018-10-28: 50 mL

## 2018-10-28 MED ORDER — PHENYLEPHRINE 40 MCG/ML (10ML) SYRINGE FOR IV PUSH (FOR BLOOD PRESSURE SUPPORT)
PREFILLED_SYRINGE | INTRAVENOUS | Status: AC
  Filled 2018-10-28: qty 10

## 2018-10-28 MED ORDER — BUPIVACAINE-EPINEPHRINE (PF) 0.25% -1:200000 IJ SOLN
INTRAMUSCULAR | Status: AC
  Filled 2018-10-28: qty 30

## 2018-10-28 MED ORDER — PROPOFOL 10 MG/ML IV BOLUS
INTRAVENOUS | Status: DC | PRN
  Administered 2018-10-28: 150 mg via INTRAVENOUS

## 2018-10-28 MED ORDER — FENTANYL CITRATE (PF) 100 MCG/2ML IJ SOLN
25.0000 ug | INTRAMUSCULAR | Status: DC | PRN
  Administered 2018-10-28: 25 ug via INTRAVENOUS

## 2018-10-28 MED ORDER — ACETAMINOPHEN 650 MG RE SUPP: 650.0000 mg | RECTAL | Status: DC | PRN

## 2018-10-28 MED ORDER — SODIUM CHLORIDE 0.9 % IV SOLN: 250.0000 mL | INTRAVENOUS | Status: DC | PRN

## 2018-10-28 MED ORDER — SUCCINYLCHOLINE CHLORIDE 200 MG/10ML IV SOSY
PREFILLED_SYRINGE | INTRAVENOUS | Status: AC
  Filled 2018-10-28: qty 10

## 2018-10-28 MED ORDER — EPINEPHRINE PF 1 MG/ML IJ SOLN
INTRAMUSCULAR | Status: DC | PRN
  Administered 2018-10-28: 1 mg

## 2018-10-28 MED ORDER — BUPIVACAINE-EPINEPHRINE 0.25% -1:200000 IJ SOLN
INTRAMUSCULAR | Status: DC | PRN
  Administered 2018-10-28: 30 mL

## 2018-10-28 MED ORDER — SUGAMMADEX SODIUM 500 MG/5ML IV SOLN
INTRAVENOUS | Status: AC
  Filled 2018-10-28: qty 5

## 2018-10-28 MED ORDER — ROCURONIUM BROMIDE 100 MG/10ML IV SOLN
INTRAVENOUS | Status: DC | PRN
  Administered 2018-10-28: 50 mg via INTRAVENOUS
  Administered 2018-10-28: 20 mg via INTRAVENOUS

## 2018-10-28 MED ORDER — MEPERIDINE HCL 25 MG/ML IJ SOLN: 6.2500 mg | INTRAMUSCULAR | Status: DC | PRN

## 2018-10-28 MED ORDER — EPHEDRINE 5 MG/ML INJ
INTRAVENOUS | Status: AC
  Filled 2018-10-28: qty 10

## 2018-10-28 MED ORDER — MIDAZOLAM HCL 2 MG/2ML IJ SOLN
INTRAMUSCULAR | Status: AC
  Filled 2018-10-28: qty 2

## 2018-10-28 MED ORDER — CEFAZOLIN SODIUM-DEXTROSE 2-4 GM/100ML-% IV SOLN
INTRAVENOUS | Status: AC
  Filled 2018-10-28: qty 100

## 2018-10-28 MED ORDER — SODIUM CHLORIDE 0.9% FLUSH: 3.0000 mL | Freq: Two times a day (BID) | INTRAVENOUS | Status: DC

## 2018-10-28 MED ORDER — BUPIVACAINE-EPINEPHRINE (PF) 0.5% -1:200000 IJ SOLN
INTRAMUSCULAR | Status: AC
  Filled 2018-10-28: qty 30

## 2018-10-28 MED ORDER — MIDAZOLAM HCL 2 MG/2ML IJ SOLN
1.0000 mg | INTRAMUSCULAR | Status: DC | PRN
  Administered 2018-10-28: 1 mg via INTRAVENOUS

## 2018-10-28 MED ORDER — SCOPOLAMINE 1 MG/3DAYS TD PT72: 1.0000 | MEDICATED_PATCH | Freq: Once | TRANSDERMAL | Status: DC | PRN

## 2018-10-28 MED ORDER — ACETAMINOPHEN 325 MG PO TABS: 325.0000 mg | ORAL_TABLET | ORAL | Status: DC | PRN

## 2018-10-28 MED ORDER — OXYCODONE HCL 5 MG PO TABS: 5.0000 mg | ORAL_TABLET | ORAL | Status: DC | PRN

## 2018-10-28 MED ORDER — EPHEDRINE SULFATE 50 MG/ML IJ SOLN
INTRAMUSCULAR | Status: DC | PRN
  Administered 2018-10-28: 10 mg via INTRAVENOUS

## 2018-10-28 MED ORDER — SODIUM CHLORIDE 0.9% FLUSH: 3.0000 mL | INTRAVENOUS | Status: DC | PRN

## 2018-10-28 MED ORDER — CEFAZOLIN SODIUM-DEXTROSE 2-4 GM/100ML-% IV SOLN
2.0000 g | INTRAVENOUS | Status: AC
  Administered 2018-10-28: 2 g via INTRAVENOUS

## 2018-10-28 MED ORDER — ONDANSETRON HCL 4 MG/2ML IJ SOLN
INTRAMUSCULAR | Status: AC
  Filled 2018-10-28: qty 2

## 2018-10-28 MED ORDER — OXYCODONE HCL 5 MG PO TABS: 5.0000 mg | ORAL_TABLET | Freq: Once | ORAL | Status: DC | PRN

## 2018-10-28 MED ORDER — DEXAMETHASONE SODIUM PHOSPHATE 10 MG/ML IJ SOLN
INTRAMUSCULAR | Status: AC
  Filled 2018-10-28: qty 1

## 2018-10-28 MED ORDER — LIDOCAINE 2% (20 MG/ML) 5 ML SYRINGE
INTRAMUSCULAR | Status: AC
  Filled 2018-10-28: qty 5

## 2018-10-28 MED ORDER — FENTANYL CITRATE (PF) 100 MCG/2ML IJ SOLN
INTRAMUSCULAR | Status: AC
  Filled 2018-10-28: qty 2

## 2018-10-28 MED ORDER — FENTANYL CITRATE (PF) 100 MCG/2ML IJ SOLN
50.0000 ug | INTRAMUSCULAR | Status: DC | PRN
  Administered 2018-10-28 (×2): 50 ug via INTRAVENOUS

## 2018-10-28 MED ORDER — ACETAMINOPHEN 325 MG PO TABS: 650.0000 mg | ORAL_TABLET | ORAL | Status: DC | PRN

## 2018-10-28 MED ORDER — SODIUM CHLORIDE 0.9 % IV SOLN
INTRAVENOUS | Status: DC | PRN
  Administered 2018-10-28: 500 mL

## 2018-10-28 MED ORDER — EPINEPHRINE PF 1 MG/ML IJ SOLN
INTRAMUSCULAR | Status: AC
  Filled 2018-10-28: qty 1

## 2018-10-28 MED ORDER — OXYCODONE HCL 5 MG/5ML PO SOLN: 5.0000 mg | Freq: Once | ORAL | Status: DC | PRN

## 2018-10-28 SURGICAL SUPPLY — 66 items
APL PRP STRL LF DISP 70% ISPRP (MISCELLANEOUS) ×2
BAG DECANTER FOR FLEXI CONT (MISCELLANEOUS) ×2 IMPLANT
BINDER BREAST 3XL (GAUZE/BANDAGES/DRESSINGS) ×2 IMPLANT
BINDER BREAST LRG (GAUZE/BANDAGES/DRESSINGS) IMPLANT
BINDER BREAST MEDIUM (GAUZE/BANDAGES/DRESSINGS) IMPLANT
BINDER BREAST XLRG (GAUZE/BANDAGES/DRESSINGS) IMPLANT
BINDER BREAST XXLRG (GAUZE/BANDAGES/DRESSINGS) IMPLANT
BIOPATCH RED 1 DISK 7.0 (GAUZE/BANDAGES/DRESSINGS) IMPLANT
BLADE HEX COATED 2.75 (ELECTRODE) ×2 IMPLANT
BLADE KNIFE PERSONA 10 (BLADE) ×4 IMPLANT
BLADE SURG 15 STRL LF DISP TIS (BLADE) IMPLANT
BLADE SURG 15 STRL SS (BLADE)
BNDG GAUZE ELAST 4 BULKY (GAUZE/BANDAGES/DRESSINGS) ×4 IMPLANT
CANISTER SUCT 1200ML W/VALVE (MISCELLANEOUS) ×2 IMPLANT
CHLORAPREP W/TINT 26 (MISCELLANEOUS) ×4 IMPLANT
COVER BACK TABLE REUSABLE LG (DRAPES) ×2 IMPLANT
COVER MAYO STAND REUSABLE (DRAPES) ×2 IMPLANT
COVER WAND RF STERILE (DRAPES) IMPLANT
DECANTER SPIKE VIAL GLASS SM (MISCELLANEOUS) IMPLANT
DERMABOND ADVANCED (GAUZE/BANDAGES/DRESSINGS) ×1
DERMABOND ADVANCED .7 DNX12 (GAUZE/BANDAGES/DRESSINGS) ×1 IMPLANT
DRAIN CHANNEL 19F RND (DRAIN) IMPLANT
DRAPE LAPAROSCOPIC ABDOMINAL (DRAPES) ×2 IMPLANT
DRSG PAD ABDOMINAL 8X10 ST (GAUZE/BANDAGES/DRESSINGS) ×4 IMPLANT
ELECT BLADE 4.0 EZ CLEAN MEGAD (MISCELLANEOUS)
ELECT REM PT RETURN 9FT ADLT (ELECTROSURGICAL) ×2
ELECTRODE BLDE 4.0 EZ CLN MEGD (MISCELLANEOUS) IMPLANT
ELECTRODE REM PT RTRN 9FT ADLT (ELECTROSURGICAL) ×1 IMPLANT
EVACUATOR SILICONE 100CC (DRAIN) IMPLANT
GAUZE SPONGE 4X4 12PLY STRL LF (GAUZE/BANDAGES/DRESSINGS) ×2 IMPLANT
GLOVE BIO SURGEON STRL SZ 6.5 (GLOVE) ×6 IMPLANT
GLOVE SURG SS PI 6.5 STRL IVOR (GLOVE) ×6 IMPLANT
GLOVE SURG SYN 7.5  E (GLOVE) ×2
GLOVE SURG SYN 7.5 E (GLOVE) ×2 IMPLANT
GOWN STRL REUS W/ TWL LRG LVL3 (GOWN DISPOSABLE) ×3 IMPLANT
GOWN STRL REUS W/TWL LRG LVL3 (GOWN DISPOSABLE) ×6
NDL SAFETY ECLIPSE 18X1.5 (NEEDLE) IMPLANT
NEEDLE HYPO 18GX1.5 SHARP (NEEDLE)
NEEDLE HYPO 25X1 1.5 SAFETY (NEEDLE) ×2 IMPLANT
NS IRRIG 1000ML POUR BTL (IV SOLUTION) IMPLANT
PACK BASIN DAY SURGERY FS (CUSTOM PROCEDURE TRAY) ×2 IMPLANT
PAD ALCOHOL SWAB (MISCELLANEOUS) ×2 IMPLANT
PENCIL BUTTON HOLSTER BLD 10FT (ELECTRODE) ×2 IMPLANT
PIN SAFETY STERILE (MISCELLANEOUS) IMPLANT
SLEEVE SCD COMPRESS KNEE MED (MISCELLANEOUS) ×2 IMPLANT
SPONGE LAP 18X18 RF (DISPOSABLE) ×4 IMPLANT
STRIP SUTURE WOUND CLOSURE 1/2 (SUTURE) ×4 IMPLANT
SUT MNCRL AB 4-0 PS2 18 (SUTURE) ×8 IMPLANT
SUT MON AB 3-0 SH 27 (SUTURE) ×2
SUT MON AB 3-0 SH27 (SUTURE) ×1 IMPLANT
SUT MON AB 5-0 PS2 18 (SUTURE) ×4 IMPLANT
SUT PDS 3-0 CT2 (SUTURE)
SUT PDS AB 2-0 CT2 27 (SUTURE) IMPLANT
SUT PDS II 3-0 CT2 27 ABS (SUTURE) IMPLANT
SUT SILK 3 0 PS 1 (SUTURE) IMPLANT
SYR 3ML 23GX1 SAFETY (SYRINGE) ×2 IMPLANT
SYR 50ML LL SCALE MARK (SYRINGE) ×2 IMPLANT
SYR BULB IRRIGATION 50ML (SYRINGE) ×2 IMPLANT
SYR CONTROL 10ML LL (SYRINGE) ×2 IMPLANT
TAPE MEASURE VINYL STERILE (MISCELLANEOUS) ×2 IMPLANT
TOWEL GREEN STERILE FF (TOWEL DISPOSABLE) ×4 IMPLANT
TUBE CONNECTING 20X1/4 (TUBING) ×2 IMPLANT
TUBING INFILTRATION IT-10001 (TUBING) ×2 IMPLANT
TUBING SET GRADUATE ASPIR 12FT (MISCELLANEOUS) ×2 IMPLANT
UNDERPAD 30X30 (UNDERPADS AND DIAPERS) ×4 IMPLANT
YANKAUER SUCT BULB TIP NO VENT (SUCTIONS) ×2 IMPLANT

## 2018-10-28 NOTE — Transfer of Care (Signed)
Immediate Anesthesia Transfer of Care Note  Patient: Jasmine Hood  Procedure(s) Performed: Excision of bilateral excess breast tissue (Bilateral Breast)  Patient Location: PACU  Anesthesia Type:General  Level of Consciousness: awake, alert  and oriented  Airway & Oxygen Therapy: Patient Spontanous Breathing and Patient connected to face mask oxygen  Post-op Assessment: Report given to RN and Post -op Vital signs reviewed and stable  Post vital signs: Reviewed and stable  Last Vitals:  Vitals Value Taken Time  BP 148/74 10/28/2018 10:43 AM  Temp    Pulse 88 10/28/2018 10:43 AM  Resp 11 10/28/2018 10:43 AM  SpO2 96 % 10/28/2018 10:43 AM  Vitals shown include unvalidated device data.  Last Pain:  Vitals:   10/28/18 0734  TempSrc: Oral  PainSc: 0-No pain         Complications: No apparent anesthesia complications

## 2018-10-28 NOTE — Op Note (Addendum)
DATE OF OPERATION: 10/28/2018  LOCATION: [facility]  PREOPERATIVE DIAGNOSIS: excess breast tissue after mastectomies  POSTOPERATIVE DIAGNOSIS: Same  PROCEDURE: Excision of bilateral excess breast tissue 1. Right:  Medial 2 x 5 cm 2. Right: Lateral 7 x 10 cm 3. Left: Medial 2 x 4 cm 4. Left Lateral 4 x 7 cm  SURGEON: Claire Sanger Dillingham, DO  EBL: 10 cc  CONDITION: Stable  COMPLICATIONS: None  INDICATION: The patient, Jasmine Hood, is a 73 y.o. female born on 24-May-1945, is here for treatment excess breast tissue after undergoing bilateral mastectomies.   PROCEDURE DETAILS:  The patient was seen prior to surgery and marked.  The IV antibiotics were given. The patient was taken to the operating room and given a general anesthetic. A standard time out was performed and all information was confirmed by those in the room. SCDs were placed.   The chest was prepped and draped.  Local was injected for intraoperative hemostasis and postoperative pain control.  Local was injected at the incision sites. Tumescent was placed in all four sites through a slit in the skin made with the #10 blade.  After waiting for several minutes the liposuction was performed on the left side.  There was 800 cc of liposuction removed from the left breast.  I was then able to identify the excess skin and it was marked medially and laterally with the marking pen.  The #10 blade was used to excise a 2 x 5 cm area of the medial left breast excess skin.  This included skin, soft tissue and fat.   Hemostasis was achieved with electrocautery.  This was closed in layers with the 3-0 Monocryl deep followed by the 4-0 and 5-0 Monocryl.  The lateral left aspect was then excised with the #10 blade for an area of 7 x 10 skin, soft tissue and fat.   This was closed with the 3-0 Monocryl deep followed by the 4-0 and 5-0 Monocryl.   Right:  Attention was then turned to the right breast.  Local was injected for  intraoperative hemostasis and postoperative pain control.  Local was injected at the incision sites. Tumescent was placed in all four sites through a slit in the skin made with the #10 blade.  After waiting for several minutes the liposuction was performed on the left side.  There was 600 cc of liposuction removed from the left breast.  I was then able to identify the excess skin and it was marked medially and laterally with the marking pen.  The #10 blade was used to excise a 2 x 4 cm area of the medial left breast excess skin.  This included skin, soft tissue and fat.   Hemostasis was achieved with electrocautery.  This was closed in layers with the 3-0 Monocryl deep followed by the 4-0 and 5-0 Monocryl.  The lateral left aspect was then excised with the #10 blade for an area of 4 x 7 skin, soft tissue and fat.   This was closed with the 3-0 Monocryl deep followed by the 4-0 and 5-0 Monocryl.  Less was done on the right due to the tightness of the skin from radiation and the limitation on abduction of the shoulder.  The medial specimens were sent to pathology.  Derma bond was applied to all incision sited.  Steri strips were applied to the medial incisions.  The patient was placed in a breast binder. The patient was allowed to wake up and taken to  recovery room in stable condition at the end of the case. The family was notified at the end of the case.

## 2018-10-28 NOTE — Anesthesia Procedure Notes (Signed)
Procedure Name: Intubation Date/Time: 10/28/2018 8:35 AM Performed by: Willa Frater, CRNA Pre-anesthesia Checklist: Patient identified, Emergency Drugs available, Suction available and Patient being monitored Patient Re-evaluated:Patient Re-evaluated prior to induction Oxygen Delivery Method: Circle system utilized Preoxygenation: Pre-oxygenation with 100% oxygen Induction Type: IV induction Ventilation: Mask ventilation without difficulty Grade View: Grade I Tube type: Oral Tube size: 7.0 mm Number of attempts: 1 Airway Equipment and Method: Stylet,  Oral airway and Video-laryngoscopy Placement Confirmation: ETT inserted through vocal cords under direct vision,  positive ETCO2 and breath sounds checked- equal and bilateral Secured at: 22 cm Tube secured with: Tape Dental Injury: Teeth and Oropharynx as per pre-operative assessment  Difficulty Due To: Difficulty was anticipated, Difficult Airway- due to dentition and Difficult Airway- due to anterior larynx

## 2018-10-28 NOTE — OR Nursing (Signed)
817ml lipo tissue left side 649ml lipo tissue right side

## 2018-10-28 NOTE — Anesthesia Postprocedure Evaluation (Signed)
Anesthesia Post Note  Patient: Jasmine Hood  Procedure(s) Performed: Excision of bilateral excess breast tissue (Bilateral Breast)     Patient location during evaluation: PACU Anesthesia Type: General Level of consciousness: awake and alert Pain management: pain level controlled Vital Signs Assessment: post-procedure vital signs reviewed and stable Respiratory status: spontaneous breathing, nonlabored ventilation, respiratory function stable and patient connected to nasal cannula oxygen Cardiovascular status: blood pressure returned to baseline and stable Postop Assessment: no apparent nausea or vomiting Anesthetic complications: no    Last Vitals:  Vitals:   10/28/18 1122 10/28/18 1200  BP: (!) 167/84 (!) 134/97  Pulse: 87 93  Resp: (!) 21 18  Temp:  36.6 C  SpO2: 95% 96%    Last Pain:  Vitals:   10/28/18 1200  TempSrc:   PainSc: 0-No pain                 Italia Wolfert

## 2018-10-28 NOTE — Discharge Instructions (Addendum)
INSTRUCTIONS FOR AFTER SURGERY   You are having surgery.  You will likely have some questions about what to expect following your operation.  The following information will help you and your family understand what to expect when you are discharged from the hospital.  Following these guidelines will help ensure a smooth recovery and reduce risks of complications.   Postoperative instructions include information on: diet, wound care, medications and physical activity.  AFTER SURGERY Expect to go home after the procedure.    DIET This surgery does not require a specific diet.  However, I have to mention that the healthier you eat the better your body can start healing. It is important to increasing your protein intake.  This means limiting the foods with sugar and carbohydrates.  Focus on vegetables and some meat.  If you have any liposuction during your procedure be sure to drink water.  If your urine is bright yellow, then it is concentrated, and you need to drink more water.  As a general rule after surgery, you should have 8 ounces of water every hour while awake.  If you find you are persistently nauseated or unable to take in liquids let us know.  NO TOBACCO USE or EXPOSURE.  This will slow your healing process and increase the risk of a wound.  WOUND CARE You can shower the day after surgery if you don't have a drain.  Use fragrance free soap.  Dial, [place] and [place] are usually mild on the skin. If you have a drain clean with baby wipes until the drain is removed.  If you have steri-strips / tape directly attached to your skin leave them in place. It is OK to get these wet.  No baths, pools or hot tubs for two weeks. We close your incision to leave the smallest and best-looking scar. No ointment or creams on your incisions until given the go ahead.  Especially not Neosporin (Too many skin reactions with this one).  A few weeks after surgery you can use Mederma and start massaging the scar. We ask  you to wear your binder or sports bra for the first 6 weeks around the clock, including while sleeping. This provides added comfort and helps reduce the fluid accumulation at the surgery site.  ACTIVITY No heavy lifting until cleared by the doctor.  It is OK to walk and climb stairs. In fact, moving your legs is very important to decrease your risk of a blood clot.  It will also help keep you from getting deconditioned.  Every 1 to 2 hours get up and walk for 5 minutes. This will help with a quicker recovery back to normal.  Let pain be your guide so you don't do too much.  NO, you cannot do the spring cleaning and don't plan on taking care of anyone else.  This is your time for TLC.  You will be more comfortable if you sleep and rest with your head elevated either with a few pillows under you or in a recliner.  No stomach sleeping for a few months.  WORK Everyone returns to work at different times. As a rough guide, most people take at least 1 - 2 weeks off prior to returning to work. If you need documentation for your job, bring the forms to your postoperative follow up visit.  DRIVING Arrange for someone to bring you home from the hospital.  You may be able to drive a few days after surgery but not while taking  any narcotics or valium.  BOWEL MOVEMENTS Constipation can occur after anesthesia and while taking pain medication.  It is important to stay ahead for your comfort.  We recommend taking Milk of Magnesia (2 tablespoons; twice a day) while taking the pain pills.  SEROMA This is fluid your body tried to put in the surgical site.  This is normal but if it creates tight skinny skin let us know.  It usually decreases in a few weeks.  WHEN TO CALL Call your surgeon's office if any of the following occur:  Fever 101 degrees F or greater  Excessive bleeding or fluid from the incision site.  Pain that increases over time without aid from the medications  Redness, warmth, or pus draining  from incision sites  Persistent nausea or inability to take in liquids  Severe misshapen area that underwent the operation.   Post Anesthesia Home Care Instructions  Activity: Get plenty of rest for the remainder of the day. A responsible individual must stay with you for 24 hours following the procedure.  For the next 24 hours, DO NOT: -Drive a car -Paediatric nurse -Drink alcoholic beverages -Take any medication unless instructed by your physician -Make any legal decisions or sign important papers.  Meals: Start with liquid foods such as gelatin or soup. Progress to regular foods as tolerated. Avoid greasy, spicy, heavy foods. If nausea and/or vomiting occur, drink only clear liquids until the nausea and/or vomiting subsides. Call your physician if vomiting continues.  Special Instructions/Symptoms: Your throat may feel dry or sore from the anesthesia or the breathing tube placed in your throat during surgery. If this causes discomfort, gargle with warm salt water. The discomfort should disappear within 24 hours.  If you had a scopolamine patch placed behind your ear for the management of post- operative nausea and/or vomiting:  1. The medication in the patch is effective for 72 hours, after which it should be removed.  Wrap patch in a tissue and discard in the trash. Wash hands thoroughly with soap and water. 2. You may remove the patch earlier than 72 hours if you experience unpleasant side effects which may include dry mouth, dizziness or visual disturbances. 3. Avoid touching the patch. Wash your hands with soap and water after contact with the patch.

## 2018-10-28 NOTE — Interval H&P Note (Signed)
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  The various methods of treatment have been discussed with the patient and family. After consideration of risks, benefits and other options for treatment, the patient has consented to  Procedure(s) with comments: Excision of bilateral excess breast tissue (Bilateral) - 2 hours, please. as a surgical intervention.  The patient's history has been reviewed, patient examined, no change in status, stable for surgery.  I have reviewed the patient's chart and labs.  Questions were answered to the patient's satisfaction.     Loel Lofty Cortina Vultaggio

## 2018-10-29 ENCOUNTER — Encounter (HOSPITAL_BASED_OUTPATIENT_CLINIC_OR_DEPARTMENT_OTHER): Payer: Self-pay | Admitting: Plastic Surgery

## 2018-10-30 NOTE — Telephone Encounter (Signed)
Pt is scheduled w/ [name] on 8/6

## 2018-11-06 ENCOUNTER — Other Ambulatory Visit: Payer: Self-pay

## 2018-11-06 ENCOUNTER — Encounter: Payer: Self-pay | Admitting: Plastic Surgery

## 2018-11-06 ENCOUNTER — Ambulatory Visit (INDEPENDENT_AMBULATORY_CARE_PROVIDER_SITE_OTHER): Payer: Medicare Other | Admitting: Plastic Surgery

## 2018-11-06 VITALS — BP 141/75 | HR 85 | Temp 98.8°F | Ht 59.5 in | Wt 221.0 lb

## 2018-11-06 DIAGNOSIS — Z170 Estrogen receptor positive status [ER+]: Secondary | ICD-10-CM

## 2018-11-06 DIAGNOSIS — Z853 Personal history of malignant neoplasm of breast: Secondary | ICD-10-CM

## 2018-11-06 DIAGNOSIS — L989 Disorder of the skin and subcutaneous tissue, unspecified: Secondary | ICD-10-CM

## 2018-11-06 DIAGNOSIS — C50811 Malignant neoplasm of overlapping sites of right female breast: Secondary | ICD-10-CM

## 2018-11-07 ENCOUNTER — Encounter: Payer: Self-pay | Admitting: Plastic Surgery

## 2018-11-07 DIAGNOSIS — L989 Disorder of the skin and subcutaneous tissue, unspecified: Secondary | ICD-10-CM | POA: Insufficient documentation

## 2018-11-07 NOTE — Progress Notes (Signed)
   Subjective:    Patient ID: Jasmine Hood, female    DOB: 1946/03/08, 73 y.o.   MRN: [id number]  The patient is a 73 year old female here for follow-up after undergoing excision of excess breast tissue.  She is very happy with the improvement.  All the areas are healing nicely.  There is no sign of infection.  I was concerned about the right side due to her history of radiation.  That appears to be healing well.  All incisions are intact.  She also complains of a changing skin lesion on her gluteal area.  This is 7 mm in size flesh colored and in the midline.  I can see how this would be concerning in an irritating location.   Review of Systems  Constitutional: Negative for activity change and appetite change.  Respiratory: Negative for chest tightness and shortness of breath.   Cardiovascular: Negative for leg swelling.  Gastrointestinal: Negative for abdominal pain.  Skin: Negative for wound.       Objective:   Physical Exam Vitals signs and nursing note reviewed.  Constitutional:      Appearance: Normal appearance.  HENT:     Head: Normocephalic and atraumatic.  Cardiovascular:     Rate and Rhythm: Normal rate.  Pulmonary:     Effort: Pulmonary effort is normal. No respiratory distress.  Musculoskeletal:       Back:  Neurological:     Mental Status: She is alert.  Psychiatric:        Mood and Affect: Mood normal.        Thought Content: Thought content normal.        Assessment & Plan:     ICD-10-CM   1. History of right breast cancer  Z85.3   2. Changing skin lesion  L98.9   3. Malignant neoplasm of overlapping sites of right breast in female, estrogen receptor positive ([place])  C50.811    Z17.0     Plan for excision of changing skin lesion at the gluteal area.  At that time we will see her in follow-up for her breast surgery as well.

## 2018-11-26 ENCOUNTER — Ambulatory Visit: Payer: Self-pay

## 2018-11-26 ENCOUNTER — Inpatient Hospital Stay: Payer: Medicare Other | Attending: Oncology | Admitting: Oncology

## 2018-11-26 ENCOUNTER — Other Ambulatory Visit: Payer: Self-pay

## 2018-11-26 ENCOUNTER — Inpatient Hospital Stay: Payer: Medicare Other

## 2018-11-26 VITALS — BP 186/76 | HR 84 | Temp 98.9°F | Resp 18 | Ht 59.5 in | Wt 224.7 lb

## 2018-11-26 VITALS — BP 155/72 | HR 88 | Temp 98.7°F | Resp 18

## 2018-11-26 DIAGNOSIS — C50811 Malignant neoplasm of overlapping sites of right female breast: Secondary | ICD-10-CM

## 2018-11-26 DIAGNOSIS — K7469 Other cirrhosis of liver: Secondary | ICD-10-CM

## 2018-11-26 DIAGNOSIS — Z170 Estrogen receptor positive status [ER+]: Secondary | ICD-10-CM

## 2018-11-26 DIAGNOSIS — C7951 Secondary malignant neoplasm of bone: Secondary | ICD-10-CM

## 2018-11-26 DIAGNOSIS — Z95828 Presence of other vascular implants and grafts: Secondary | ICD-10-CM

## 2018-11-26 DIAGNOSIS — Z79811 Long term (current) use of aromatase inhibitors: Secondary | ICD-10-CM | POA: Diagnosis not present

## 2018-11-26 LAB — CBC WITH DIFFERENTIAL/PLATELET
Abs Immature Granulocytes: 0.02 10*3/uL (ref 0.00–0.07)
Basophils Absolute: 0 10*3/uL (ref 0.0–0.1)
Basophils Relative: 0 %
Eosinophils Absolute: 0.1 10*3/uL (ref 0.0–0.5)
Eosinophils Relative: 2 %
HCT: 36.1 % (ref 36.0–46.0)
Hemoglobin: 11.9 g/dL — ABNORMAL LOW (ref 12.0–15.0)
Immature Granulocytes: 0 %
Lymphocytes Relative: 24 %
Lymphs Abs: 1.5 10*3/uL (ref 0.7–4.0)
MCH: 33.4 pg (ref 26.0–34.0)
MCHC: 33 g/dL (ref 30.0–36.0)
MCV: 101.4 fL — ABNORMAL HIGH (ref 80.0–100.0)
Monocytes Absolute: 0.5 10*3/uL (ref 0.1–1.0)
Monocytes Relative: 7 %
Neutro Abs: 4.3 10*3/uL (ref 1.7–7.7)
Neutrophils Relative %: 67 %
Platelets: 180 10*3/uL (ref 150–400)
RBC: 3.56 MIL/uL — ABNORMAL LOW (ref 3.87–5.11)
RDW: 14.3 % (ref 11.5–15.5)
WBC: 6.5 10*3/uL (ref 4.0–10.5)
nRBC: 0 % (ref 0.0–0.2)

## 2018-11-26 LAB — COMPREHENSIVE METABOLIC PANEL
ALT: 24 U/L (ref 0–44)
AST: 28 U/L (ref 15–41)
Albumin: 2.9 g/dL — ABNORMAL LOW (ref 3.5–5.0)
Alkaline Phosphatase: 96 U/L (ref 38–126)
Anion gap: 10 (ref 5–15)
BUN: 11 mg/dL (ref 8–23)
CO2: 24 mmol/L (ref 22–32)
Calcium: 8.9 mg/dL (ref 8.9–10.3)
Chloride: 105 mmol/L (ref 98–111)
Creatinine, Ser: 0.74 mg/dL (ref 0.44–1.00)
GFR calc Af Amer: >60 mL/min (ref >60)
GFR calc non Af Amer: >60 mL/min (ref >60)
Glucose, Bld: 166 mg/dL — ABNORMAL HIGH (ref 70–99)
Potassium: 3.9 mmol/L (ref 3.5–5.1)
Sodium: 139 mmol/L (ref 135–145)
Total Bilirubin: 0.7 mg/dL (ref 0.3–1.2)
Total Protein: 6.7 g/dL (ref 6.5–8.1)

## 2018-11-26 MED ORDER — DENOSUMAB 120 MG/1.7ML ~~LOC~~ SOLN
120.0000 mg | Freq: Once | SUBCUTANEOUS | Status: AC
  Administered 2018-11-26: 120 mg via SUBCUTANEOUS

## 2018-11-26 MED ORDER — ANASTROZOLE 1 MG PO TABS: 1.0000 mg | ORAL_TABLET | Freq: Every day | ORAL | 4 refills | Status: DC

## 2018-11-26 MED ORDER — DENOSUMAB 120 MG/1.7ML ~~LOC~~ SOLN
SUBCUTANEOUS | Status: AC
  Filled 2018-11-26: qty 1.7

## 2018-11-26 NOTE — Progress Notes (Signed)
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   Jasmine Hood supposedly has been on anastrozole however she notes she doesn't take this regularly. "I take it when I go out because of the virus."  She took 1 pill today to protect herself from the virus, she says.  She also receives denosumab/Xgeva, with a dose due today. The last dose she received was 05/21/2018. She notes no side effects from this that she is aware of.  Her most recent bone density was 12/18/2017 and showed a T-score of -0.7.  She had her port removed on 11/29/4578 with no complications.  Since her last visit, she underwent excision of bilateral excess breast tissue on 10/28/2018 under Dr. Marla Roe. Pathology from the procedure [id number]) showed: fat necrosis in the left axilla, otherwise benign skin with no evidence of malignancy. She last saw Dr. Marla Roe on 11/06/2018 with another follow up scheduled for 12/15/2018.    REVIEW OF SYSTEMS: Jasmine Hood reports she is satisfied with her reconstruction results. Her oldest daughter, Jasmine Hood, lives with her. Jasmine Hood does not work outside the home.  They are taking appropriate pandemic precautions.  A detailed review of systems was otherwise entirely stable  BREAST CANCER  HISTORY: From the original intake note  Jasmine Hood herself noted a change in her right breast sometime in September 2017. She did not immediately bring it to medical attention. When she noted some significant changes in her right nipple she saw [name] and was set up for bilateral diagnostic mammography with tomography and right breast [facility] Oct [facility]. This found the breast density to be category B. In the upper and lower outer quadrants of the right breast there was a mass measuring at least 10 cm. There were also groups of heterogeneous calcifications measuring 3 cm and a separate group 0.4 cm. On physical exam there was a palpable firm mass measuring approximately 10 cm involving the upper outer and lower outer quadrant of the right breast, with skin reaction and nipple retraction. There was no palpable right axillary adenopathy.  Right breast ultrasonography confirmed a large hypoechoic mass with irregular margins extending to the skin surface. The right axilla showed 2 lymph nodes which appeared abnormal.  In the left breast there were some indeterminate retroareolar calcifications which were felt to warrant biopsy. This was performed 10/08/2016 and showed a fibroadenoma ([id number]).  Biopsy of the right breast mass at the 7:00 and 10:00 positions as well as one of the suspicious lymph nodes all showed invasive ductal carcinoma, E-cadherin positive. Separate prognostic profiles from the 2 breast masses were sent. Estrogen receptor was positive at 95-70%, progesterone receptor was positive at 15-85%, all with strong staining intensity, the MIB-1 was 15-20%, and HER-2 was nonamplified, the signals ratio being 1.28-1.72 and the number per cell 1.85-3.95.  The patient's subsequent history is as detailed below   PAST  MEDICAL HISTORY: Past Medical History:  Diagnosis Date  . Breast cancer ([place]) 10/07/2016   right breast, spine  . Brown recluse spider bite     right leg  . COLONIC POLYPS, HX OF 01/05/2009   Qualifier: Diagnosis of  By: [name]  MD, [name]   . Complication of anesthesia    slow to awaken after wisdom  teeth extraction age 71  . Constipation   . Genetic testing 11/27/2016   Ms. Dowler underwent genetic counseling and testing for hereditary cancer syndromes on 11/19/2016. Her results were negative for mutations in all 46 genes analyzed by [facility]'s 46-gene Common Hereditary Cancers Panel. Genes analyzed include: APC, ATM, AXIN2, BARD1, BMPR1A, BRCA1, BRCA2, BRIP1, CDH1, CDKN2A, CHEK2, CTNNA1, DICER1, EPCAM, GREM1, HOXB13, KIT, MEN1, MLH1, MSH2, MSH3, MSH6, MUTYH, NBN,  . Morbid obesity ([place]) 01/05/2009   Qualifier: Diagnosis of  By: [name]  MD, [name]   . Pneumonia 1987  . Sleep apnea    no cpap yet, has not recieved results from home test yet  . Stroke [facility])    found on CT scan, no deficits    PAST SURGICAL HISTORY: Past Surgical History:  Procedure Laterality Date  . BILATERAL TOTAL MASTECTOMY WITH AXILLARY LYMPH NODE DISSECTION Bilateral 04/04/2017   Procedure: RIGHT MASTECTOMY WITH TARGETED RIGHT AXILLARY NODE DISSECTION AND LEFT PROPHYLATIC MASTECTOMY ERAS PATHWAY;  Surgeon: Alphonsa Overall, MD;  Location: [place];  Service: General;  Laterality: Bilateral;  PEC BLOCK  . BREAST BIOPSY Right 10/07/2016   invasive mammary carcinoma  . BREAST BIOPSY Left 10/08/2016   breast fibroadenoma no malignancy  . BREAST REDUCTION SURGERY Bilateral 10/28/2018   Procedure: Excision of bilateral excess breast tissue;  Surgeon: [name], DO;  Location: [place];  Service: Plastics;  Laterality: Bilateral;  2 hours, please.  Marland Kitchen CATARACT EXTRACTION Bilateral   . COLONOSCOPY W/ BIOPSIES AND POLYPECTOMY  2012  . COLONOSCOPY WITH PROPOFOL N/A 12/22/2015   Procedure: COLONOSCOPY WITH PROPOFOL;  Surgeon: [name], MD;  Location: [facility];  Service: Endoscopy;  Laterality: N/A;  . INCISION AND DRAINAGE  Right 1998   surgery for spider bite on RLE; "[name]"  . MASTECTOMY Left 04/04/2017   [name]  . MASTECTOMY WITH AXILLARY LYMPH NODE DISSECTION Right 04/04/2017  . MOLE REMOVAL  10/29/2016   Procedure: MOLE REMOVAL LEFT CHEST;  Surgeon: Alphonsa Overall, MD;  Location: [facility];  Service: General;;  . PORT-A-CATH REMOVAL Left 08/05/2018   Procedure: REMOVAL PORT-A-CATH;  Surgeon: [name], DO;  Location: [facility];  Service: Plastics;  Laterality: Left;  . PORTACATH PLACEMENT N/A 10/29/2016   Procedure: INSERTION PORT-A-CATH;  Surgeon: Alphonsa Overall, MD;  Location: [facility];  Service: General;  Laterality: N/A;  . TUBAL LIGATION    . WISDOM TOOTH EXTRACTION  age 36    FAMILY HISTORY Family History  Problem Relation Age of Onset  . Breast cancer Mother 79       d.52 from metastatic disease  . Throat cancer Father        d.65 history of smoking  . Rheum arthritis Sister        3 sisters pos. for osteo and RA  . Diabetes Maternal Aunt   . Cancer Maternal Grandfather        mouth cancer-chewed tobacco  . Breast cancer Cousin 25  . Breast cancer Daughter 60  . Stomach cancer Sister 41  The patient's father died at the  age of 63 from laryngeal cancer in the setting of tobacco abuse. The patient's mother died at the age of 69 from metastatic breast cancer which had been diagnosed in her early 58s. The patient has 2 half-sisters and one half-brother. One of her sisters has "stomach" cancer. There is also a cousin on the maternal side with breast cancer diagnosed at age 49. No family member has been genetically tested yet   GYNECOLOGIC HISTORY:  No LMP recorded. Patient is postmenopausal. Menarche age 63, first live birth age 38, she is [place] P5. She is status post bilateral tubal ligation. She stopped having periods in 1985. She tells me she did not use hormone replacement. However a note from her bone density scan December 2001 states "this patient began hormone  replacement therapy 3 months ago"   SOCIAL HISTORY:  She is originally from [place]. She worked for [employer], but she is now retired. She has survived 2 husbands. At home currently is just she and her oldest daughter Jasmine Hood. She tells me this daughter had mild brain damage at birth and is not working, and cannot drive because of a history of seizures. However she is the one she is planning to name is her healthcare power of attorney. The patient has 13 grandchildren and 4 great-grandchildren. She attends a [employer]: At the 10/21/2016 visit the patient was given the appropriate documents to complete and notarize at her discretion.  She intends to name her youngest duaghter Jasmine Hood, lives in [place], as her healthcare power of attorney.    HEALTH MAINTENANCE: Social History   Tobacco Use  . Smoking status: Never Smoker  . Smokeless tobacco: Never Used  Substance Use Topics  . Alcohol use: No  . Drug use: No     Colonoscopy: 12/22/2015/Nandigam  PAP:  Bone density: 12/18/2017 with a T score of -0.7   Allergies  Allergen Reactions  . Bee Venom Anaphylaxis  . Latex Swelling    Current Outpatient Medications  Medication Sig Dispense Refill  . anastrozole (ARIMIDEX) 1 MG tablet Take 1 tablet (1 mg total) by mouth daily. 90 tablet 3  . calcium carbonate (TUMS) 500 MG chewable tablet Chew 1 tablet (200 mg of elemental calcium total) by mouth 2 (two) times daily. 120 tablet 4  . HYDROcodone-acetaminophen (NORCO) 5-325 MG tablet Take 1 tablet by mouth every 6 (six) hours as needed for moderate pain. 30 tablet 0   No current facility-administered medications for this visit.     OBJECTIVE: Morbidly obese white woman who appears stated age  4:   11/26/18 1118  BP: (!) 186/76  Pulse: 84  Resp: 18  Temp: 98.9 F (37.2 C)  SpO2: 95%     Body mass index is 44.62 kg/m.    ECOG FS:1 - Symptomatic but completely  ambulatory   Sclerae unicteric, pupils round and equal No cervical or supraclavicular adenopathy Lungs no rales or rhonchi Heart regular rate and rhythm Abd soft, obese, nontender, positive bowel sounds MSK no focal spinal tenderness Neuro: nonfocal, well oriented, appropriate affect Breasts: Status post bilateral mastectomies and status post recent revision.  There is no evidence of chest wall recurrence.  Both axillae are benign.  LAB RESULTS:  CMP     Component Value Date/Time   NA 139 11/26/2018 1038   NA 140 03/21/2017 0918   K 3.9 11/26/2018 1038   K 3.7 03/21/2017 0918   CL 105 11/26/2018 1038  CO2 24 11/26/2018 1038   CO2 23 03/21/2017 0918   GLUCOSE 166 (H) 11/26/2018 1038   GLUCOSE 120 03/21/2017 0918   BUN 11 11/26/2018 1038   BUN 6.6 (L) 03/21/2017 0918   CREATININE 0.74 11/26/2018 1038   CREATININE 0.6 03/21/2017 0918   CALCIUM 8.9 11/26/2018 1038   CALCIUM 8.6 03/21/2017 0918   PROT 6.7 11/26/2018 1038   PROT 6.3 (L) 03/21/2017 0918   ALBUMIN 2.9 (L) 11/26/2018 1038   ALBUMIN 2.4 (L) 03/21/2017 0918   AST 28 11/26/2018 1038   AST 53 (H) 03/21/2017 0918   ALT 24 11/26/2018 1038   ALT 29 03/21/2017 0918   ALKPHOS 96 11/26/2018 1038   ALKPHOS 123 03/21/2017 0918   BILITOT 0.7 11/26/2018 1038   BILITOT 0.55 03/21/2017 0918   GFRNONAA >60 11/26/2018 1038   GFRAA >60 11/26/2018 1038    No results found for: Ronnald Ramp, A1GS, A2GS, BETS, BETA2SER, GAMS, MSPIKE, SPEI  No results found for: KPAFRELGTCHN, LAMBDASER, [facility]  Lab Results  Component Value Date   WBC 6.5 11/26/2018   NEUTROABS 4.3 11/26/2018   HGB 11.9 (L) 11/26/2018   HCT 36.1 11/26/2018   MCV 101.4 (H) 11/26/2018   PLT 180 11/26/2018      Chemistry      Component Value Date/Time   NA 139 11/26/2018 1038   NA 140 03/21/2017 0918   K 3.9 11/26/2018 1038   K 3.7 03/21/2017 0918   CL 105 11/26/2018 1038   CO2 24 11/26/2018 1038   CO2 23 03/21/2017 0918   BUN 11  11/26/2018 1038   BUN 6.6 (L) 03/21/2017 0918   CREATININE 0.74 11/26/2018 1038   CREATININE 0.6 03/21/2017 0918      Component Value Date/Time   CALCIUM 8.9 11/26/2018 1038   CALCIUM 8.6 03/21/2017 0918   ALKPHOS 96 11/26/2018 1038   ALKPHOS 123 03/21/2017 0918   AST 28 11/26/2018 1038   AST 53 (H) 03/21/2017 0918   ALT 24 11/26/2018 1038   ALT 29 03/21/2017 0918   BILITOT 0.7 11/26/2018 1038   BILITOT 0.55 03/21/2017 0918       No results found for: LABCA2  No components found for: [id number]  No results for input(s): INR in the last 168 hours.  Urinalysis    Component Value Date/Time   COLORURINE YELLOW 11/06/2017 1116   APPEARANCEUR CLEAR 11/06/2017 1116   LABSPEC 1.020 11/06/2017 1116   PHURINE 5.0 11/06/2017 1116   GLUCOSEU NEGATIVE 11/06/2017 1116   GLUCOSEU NEGATIVE 01/25/2010 0858   HGBUR NEGATIVE 11/06/2017 1116   HGBUR 1+ 12/30/2008 0838   BILIRUBINUR NEGATIVE 11/06/2017 1116   BILIRUBINUR Neg 04/28/2015 1122   KETONESUR NEGATIVE 11/06/2017 1116   PROTEINUR NEGATIVE 11/06/2017 1116   UROBILINOGEN 0.2 04/28/2015 1122   UROBILINOGEN 0.2 01/25/2010 0858   NITRITE NEGATIVE 11/06/2017 1116   LEUKOCYTESUR SMALL (A) 11/06/2017 1116     STUDIES: No results found.   ELIGIBLE FOR AVAILABLE RESEARCH PROTOCOL: no  ASSESSMENT: 73 y.o. [place], [place] woman status post right breast overlapping sites biopsy 2 and lymph node biopsy 10/07/2016 all positive for an invasive ductal carcinoma, grade 2, estrogen and progesterone receptor positive, HER-2 nonamplified, with an MIB-1 between 15 and 20%.--This is clinical stage IIIB  (a) breast MRI suggests left-sided disease, biopsy 11/18/2016 shows atypical ductal hyperplasia  (1) genetics testing 11/19/2016--Genetic counseling and testing for hereditary cancer syndromes performed on 11/19/2016. Results are negative for pathogenic mutations in 46 genes analyzed by [facility]'s Common Hereditary  Cancers Panel. Results are  dated 11/26/2016. Genes tested: APC, ATM, AXIN2, BARD1, BMPR1A, BRCA1, BRCA2, BRIP1, CDH1, CDKN2A, CHEK2, CTNNA1, DICER1, EPCAM, GREM1, HOXB13, KIT, MEN1, MLH1, MSH2, MSH3, MSH6, MUTYH, NBN, NF1, NTHL1, PALB2, PDGFRA, PMS2, POLD1, POLE, PTEN, RAD50, RAD51C, RAD51D, SDHA, SDHB, SDHC, SDHD, SMAD4, SMARCA4, STK11, TP53, TSC1, TSC2, and VHL.  (a) A variant of uncertain significance (not clinically actionable) was noted in CTNNA1.    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 There was a 4 cm right breast mass with right axillary lymph nodes and evidence of cirrhosis.  (3) neoadjuvant treatment to consist of cyclophosphamide, methotrexate, and fluorouracil (CMF) chemotherapy every 21 days 8, starting 11/05/2016  (4) Bilateral mastectomies on 04/04/2017 left breast: DCIS, 1.2 cm, margins negative, right Breast: IMC predominantly lobular, grade 2, 8 cm, Posterior margin focally positive, 10/10 lymph noes involved.  Both are ER/PR positive, invasive tumor was HER-2 negative (ratio 1.48).  (a) the patient is interested in eventual reconstruction  (5) adjuvant radiation from 06/02/2017-07/10/2017: 1) Right chest wall/ 50.4 Gy in 28 fractions.  2) L-spine/ 30 Gy in 10 fractions  (6) Anastrozole started 09/2017  (a) bone density 12/18/2017 showed a T score of -0.7 normal (although scan quality was limited and lumbar spine was not utilized due to advanced degenerative changes. )  (7) Xgeva restarted on 11/28/17, given monthly; changed to every 6 months after January 2020 dose  PLAN: Sharetta is now 1-1/2 years out from definitive surgery for her breast cancer with no evidence of disease recurrence.  This is favorable.  I do not know exactly why she thought anastrozole was for the virus.  Of course it is for her breast cancer.  She has essentially not been taking this medication.  I offered to switch her  to fulvestrant, but she decided she did not will take the anastrozole and I am re-filling that prescription for her  She will have Xgeva today and again in 6 months.  She is taking appropriate pandemic precautions, and given her comorbidities which are considerable I reinforced her precautions  She knows to call for any other issue that may develop before her next visit.     Gagandeep Kossman, Virgie Dad, MD  11/26/18 11:55 AM Medical Oncology and Hematology [facility] [address] [place], [place] [address] Tel. [phone]    Fax. [phone]   I, Wilburn Mylar, am acting as scribe for Dr. Virgie Dad. Hollynn Garno.  I, Lurline Del MD, have reviewed the above documentation for accuracy and completeness, and I agree with the above.

## 2018-12-14 ENCOUNTER — Telehealth: Payer: Self-pay | Admitting: Plastic Surgery

## 2018-12-14 NOTE — Telephone Encounter (Signed)

## 2018-12-15 ENCOUNTER — Encounter: Payer: Self-pay | Admitting: Plastic Surgery

## 2018-12-15 ENCOUNTER — Other Ambulatory Visit: Payer: Self-pay

## 2018-12-15 ENCOUNTER — Ambulatory Visit (INDEPENDENT_AMBULATORY_CARE_PROVIDER_SITE_OTHER): Payer: Medicare Other | Admitting: Plastic Surgery

## 2018-12-15 ENCOUNTER — Other Ambulatory Visit (HOSPITAL_COMMUNITY)
Admission: RE | Admit: 2018-12-15 | Discharge: 2018-12-15 | Disposition: A | Discharge status: Home / Self Care | Payer: Medicare Other | Source: Ambulatory Visit | Attending: Plastic Surgery | Admitting: Plastic Surgery

## 2018-12-15 VITALS — BP 129/81 | HR 97 | Temp 97.8°F | Ht 60.0 in | Wt 221.6 lb

## 2018-12-15 DIAGNOSIS — B079 Viral wart, unspecified: Secondary | ICD-10-CM | POA: Diagnosis not present

## 2018-12-15 DIAGNOSIS — L989 Disorder of the skin and subcutaneous tissue, unspecified: Secondary | ICD-10-CM | POA: Diagnosis not present

## 2018-12-15 NOTE — Progress Notes (Signed)
Preoperative Dx: changing skin lesion of buttock  Postoperative Dx: Same  Procedure: Excision of changing skin lesion of left buttock 1 x 1.5 cm  Surgeon: Dr. Lyndee Leo Mourad Cwikla  Anesthesia: Lidocaine 1% with 1:100,000 epinepherine  Indication for Procedure: skin excision  Description of Procedure: Risks and complications were explained to the patient.  Consent was confirmed.  Time out was called and all information was confirmed to be correct.  The area was prepped with betadine and drapped.  Lidocaine 1% with epinepherine was injected in the subcutaneous area.  After waiting several minutes for the lidocaine to take affect a #15 blade was used to excise the area in an eliptical pattern with 98mm borders from the biopsy site.  A 5-0 Monocryl was used to close the skin edges.  The patient is to follow up in one week.  She tolerated the procedure well and there were no complications. The specimen was sent to pathology.

## 2018-12-18 ENCOUNTER — Ambulatory Visit: Payer: Self-pay | Admitting: Internal Medicine

## 2018-12-29 ENCOUNTER — Ambulatory Visit: Payer: Medicare Other

## 2018-12-29 ENCOUNTER — Ambulatory Visit (INDEPENDENT_AMBULATORY_CARE_PROVIDER_SITE_OTHER): Payer: Medicare Other | Admitting: Surgical

## 2018-12-29 ENCOUNTER — Encounter: Payer: Self-pay | Admitting: Surgical

## 2018-12-29 ENCOUNTER — Other Ambulatory Visit: Payer: Self-pay

## 2018-12-29 VITALS — BP 169/87 | HR 92 | Temp 97.7°F | Ht 59.5 in | Wt 227.8 lb

## 2018-12-29 DIAGNOSIS — L989 Disorder of the skin and subcutaneous tissue, unspecified: Secondary | ICD-10-CM

## 2018-12-29 DIAGNOSIS — G4733 Obstructive sleep apnea (adult) (pediatric): Secondary | ICD-10-CM

## 2018-12-29 NOTE — Progress Notes (Signed)
   Subjective:     Patient ID: Jasmine Hood, female    DOB: 05-02-1946, 73 y.o.   MRN: [id number]  Chief Complaint  Patient presents with  . Post-op Follow-up    for excision on bottom    HPI: The patient is a 73 y.o. female here for follow-up after excision of changing skin lesion of left buttock on 12/15/18 with Dr. Marla Roe.  Pathology showed verruca vulgaris, no atypia. Negative P16 staining.  Review of Systems  Constitutional: Negative.   Respiratory: Negative.   Cardiovascular: Negative.   Gastrointestinal: Negative.   Skin: Negative.      Objective:   Vital Signs BP (!) 169/87 (BP Location: Left Wrist, Patient Position: Sitting, Cuff Size: Large)   Pulse 92   Temp 97.7 F (36.5 C) (Temporal)   Ht 4' 11.5" (1.511 m)   Wt 227 lb 12.8 oz (103.3 kg)   SpO2 94%   BMI 45.24 kg/m  Vital Signs and Nursing Note Reviewed  Physical Exam  Constitutional: She is well-developed, well-nourished, and in no distress. No distress.  HENT:  Head: Normocephalic and atraumatic.  Cardiovascular: Normal rate.  Pulmonary/Chest: Effort normal.  Skin: Skin is warm and dry. No rash noted. She is not diaphoretic. No erythema. No pallor.  Suture in place, midline back in buttock crease   Psychiatric: Mood and affect normal.      Assessment/Plan:     ICD-10-CM   1. Changing skin lesion  L98.9     Sutures removed. Incision healing well. Bandage in place.   No sign of infection, seroma, hematoma. No dehiscence noted. Avoid soiling the area, keep clean.  Follow up as needed.  Carola Rhine Danniel Grenz, PA-C 12/29/2018, 11:03 AM

## 2019-01-01 DIAGNOSIS — G4733 Obstructive sleep apnea (adult) (pediatric): Secondary | ICD-10-CM

## 2019-03-04 ENCOUNTER — Ambulatory Visit: Payer: Medicare Other | Admitting: Internal Medicine

## 2019-05-30 NOTE — Progress Notes (Signed)
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  She also received denosumab/Xgeva.  Her last dose was 11/26/2018.  She was scheduled for dose today (currently given every 6 months).  Her most recent bone density was 12/18/2017 and showed a T-score of -0.7.  She had excess breast tissue removed by Dr. Marla Roe 10/28/2018 with benign pathology [id number]), and removal of a left buttock lesion 12/15/2018 which turned out to be a verruca vulgaris, irritated ([id number]).   REVIEW OF SYSTEMS: [place]    BREAST CANCER HISTORY: From the original intake note  Jasmine Hood herself noted a change in her right breast sometime in September 2017. She did not immediately bring it to medical attention. When she noted some significant changes in her right nipple she saw [name] and was set up for bilateral diagnostic mammography with tomography and [facility] breast [facility]. This found the breast density to be category B. In the upper and lower outer quadrants of the right breast there was a mass measuring at least 10 cm. There were also groups of heterogeneous calcifications measuring 3 cm and a separate group 0.4 cm. On physical exam there was a  palpable firm mass measuring approximately 10 cm involving the upper outer and lower outer quadrant of the right breast, with skin reaction and nipple retraction. There was no palpable right axillary adenopathy.  Right breast ultrasonography confirmed a large hypoechoic mass with irregular margins extending to the skin surface. The right axilla showed 2 lymph nodes which appeared abnormal.  In the left breast there were some indeterminate retroareolar calcifications which were felt to warrant biopsy. This was performed 10/08/2016 and showed a fibroadenoma ([id number]).  Biopsy of the right breast mass at the 7:00 and 10:00 positions as well as one of the suspicious lymph nodes all showed invasive ductal carcinoma, E-cadherin positive. Separate prognostic profiles from the 2 breast masses were sent. Estrogen receptor was positive at 95-70%, progesterone receptor was positive at 15-85%, all with strong staining intensity, the MIB-1 was 15-20%, and HER-2 was nonamplified, the signals ratio being 1.28-1.72 and the number per cell 1.85-3.95.  The patient's subsequent history is as detailed below   PAST MEDICAL HISTORY: Past Medical History:  Diagnosis Date  . Breast cancer ([place]) 10/07/2016   right breast, spine  . Brown recluse spider bite    right leg  . COLONIC POLYPS, HX OF 01/05/2009   Qualifier: Diagnosis of  By: [name]  MD, [name]   . Complication of anesthesia    slow to awaken after wisdom  teeth extraction age 74  . Constipation   . Genetic testing 11/27/2016   [name] underwent genetic counseling and testing for hereditary cancer syndromes on 11/19/2016. Her results were negative for mutations in all 46 genes analyzed by [facility]'s 46-gene Common Hereditary Cancers Panel. Genes analyzed include:  APC, ATM, AXIN2, BARD1, BMPR1A, BRCA1, BRCA2, BRIP1, CDH1, CDKN2A, CHEK2, CTNNA1, DICER1, EPCAM, GREM1, HOXB13, KIT, MEN1, MLH1, MSH2, MSH3, MSH6, MUTYH, NBN,  . Morbid obesity ([place])  01/05/2009   Qualifier: Diagnosis of  By: [name]  MD, [name]   . Pneumonia 1987  . Sleep apnea    no cpap yet, has not recieved results from home test yet  . Stroke [facility])    found on CT scan, no deficits    PAST SURGICAL HISTORY: Past Surgical History:  Procedure Laterality Date  . BILATERAL TOTAL MASTECTOMY WITH AXILLARY LYMPH NODE DISSECTION Bilateral 04/04/2017   Procedure: RIGHT MASTECTOMY WITH TARGETED RIGHT AXILLARY NODE DISSECTION AND LEFT PROPHYLATIC MASTECTOMY ERAS PATHWAY;  Surgeon: Alphonsa Overall, MD;  Location: [facility];  Service: General;  Laterality: Bilateral;  PEC BLOCK  . BREAST BIOPSY Right 10/07/2016   invasive mammary carcinoma  . BREAST BIOPSY Left 10/08/2016   breast fibroadenoma no malignancy  . BREAST REDUCTION SURGERY Bilateral 10/28/2018   Procedure: Excision of bilateral excess breast tissue;  Surgeon: [name], DO;  Location: [facility];  Service: Plastics;  Laterality: Bilateral;  2 hours, please.  Marland Kitchen CATARACT EXTRACTION Bilateral   . COLONOSCOPY W/ BIOPSIES AND POLYPECTOMY  2012  . COLONOSCOPY WITH PROPOFOL N/A 12/22/2015   Procedure: COLONOSCOPY WITH PROPOFOL;  Surgeon: [name], MD;  Location: [facility];  Service: Endoscopy;  Laterality: N/A;  . INCISION AND DRAINAGE Right 1998   surgery for spider bite on RLE; "[name]"  . MASTECTOMY Left 04/04/2017   [name]  . MASTECTOMY WITH AXILLARY LYMPH NODE DISSECTION Right 04/04/2017  . MOLE REMOVAL  10/29/2016   Procedure: MOLE REMOVAL LEFT CHEST;  Surgeon: Alphonsa Overall, MD;  Location: [facility];  Service: General;;  . PORT-A-CATH REMOVAL Left 08/05/2018   Procedure: REMOVAL PORT-A-CATH;  Surgeon: [name], DO;  Location: [facility];  Service: Plastics;  Laterality: Left;  . PORTACATH PLACEMENT N/A 10/29/2016   Procedure: INSERTION PORT-A-CATH;  Surgeon: Alphonsa Overall, MD;  Location: [facility];  Service: General;  Laterality: N/A;  . TUBAL  LIGATION    . WISDOM TOOTH EXTRACTION  age 74    FAMILY HISTORY Family History  Problem Relation Age of Onset  . Breast cancer Mother 38       d.52 from metastatic disease  . Throat cancer Father        d.65 history of smoking  . Rheum arthritis Sister        3 sisters pos. for osteo and RA  . Diabetes Maternal Aunt   . Cancer Maternal Grandfather        mouth cancer-chewed tobacco  . Breast cancer Cousin 3  . Breast cancer Daughter 81  . Stomach cancer Sister 66  The patient's father died at the age of 54 from laryngeal cancer in the setting of tobacco abuse. The patient's mother died at the age of 73 from metastatic breast cancer which had been diagnosed in her early 99s. The patient has 2 half-sisters and one half-brother. One of her sisters has "stomach" cancer. There is also a cousin on the maternal side with breast cancer diagnosed at age 58. No family member has been genetically tested yet   GYNECOLOGIC HISTORY:  No LMP recorded. Patient is postmenopausal. Menarche age 60, first live birth age 28, she is [place] P5. She is status post bilateral tubal ligation. She stopped having periods in 1985. She tells me she did  not use hormone replacement. However a note from her bone density scan December 2001 states "this patient began hormone replacement therapy 3 months ago"   SOCIAL HISTORY:  She is originally from [place]. She worked for [employer], but she is now retired. She has survived 2 husbands. At home currently is just she and her oldest daughter Nastashia Gallo. She tells me this daughter had mild brain damage at birth and is not working, and cannot drive because of a history of seizures. However she is the one she is planning to name is her healthcare power of attorney. The patient has 13 grandchildren and 4 great-grandchildren. She attends a [employer]: At the 10/21/2016 visit the patient was given the appropriate documents to  complete and notarize at her discretion.  She intends to name her youngest duaghter Dot Lanes, lives in [place], as her healthcare power of attorney.    HEALTH MAINTENANCE: Social History   Tobacco Use  . Smoking status: Never Smoker  . Smokeless tobacco: Never Used  Substance Use Topics  . Alcohol use: No  . Drug use: No     Colonoscopy: 12/22/2015/Nand[facility]am  PAP:  Bone density: 12/18/2017 with a T score of -0.7   Allergies  Allergen Reactions  . Bee Venom Anaphylaxis  . Latex Swelling    Current Outpatient Medications  Medication Sig Dispense Refill  . anastrozole (ARIMIDEX) 1 MG tablet Take 1 tablet (1 mg total) by mouth daily. 90 tablet 4  . calcium carbonate (TUMS) 500 MG chewable tablet Chew 1 tablet (200 mg of elemental calcium total) by mouth 2 (two) times daily. 120 tablet 4  . HYDROcodone-acetaminophen (NORCO) 5-325 MG tablet Take 1 tablet by mouth every 6 (six) hours as needed for moderate pain. 30 tablet 0   No current facility-administered medications for this visit.    OBJECTIVE: Morbidly obese white woman   There were no vitals filed for this visit.   There is no height or weight on file to calculate BMI.       LAB RESULTS:  CMP     Component Value Date/Time   NA 139 11/26/2018 1038   NA 140 03/21/2017 0918   K 3.9 11/26/2018 1038   K 3.7 03/21/2017 0918   CL 105 11/26/2018 1038   CO2 24 11/26/2018 1038   CO2 23 03/21/2017 0918   GLUCOSE 166 (H) 11/26/2018 1038   GLUCOSE 120 03/21/2017 0918   BUN 11 11/26/2018 1038   BUN 6.6 (L) 03/21/2017 0918   CREATININE 0.74 11/26/2018 1038   CREATININE 0.6 03/21/2017 0918   CALCIUM 8.9 11/26/2018 1038   CALCIUM 8.6 03/21/2017 0918   PROT 6.7 11/26/2018 1038   PROT 6.3 (L) 03/21/2017 0918   ALBUMIN 2.9 (L) 11/26/2018 1038   ALBUMIN 2.4 (L) 03/21/2017 0918   AST 28 11/26/2018 1038   AST 53 (H) 03/21/2017 0918   ALT 24 11/26/2018 1038   ALT 29 03/21/2017 0918   ALKPHOS 96 11/26/2018 1038    ALKPHOS 123 03/21/2017 0918   BILITOT 0.7 11/26/2018 1038   BILITOT 0.55 03/21/2017 0918   GFRNONAA >60 11/26/2018 1038   GFRAA >60 11/26/2018 1038    No results found for: TOTALPROTELP, ALBUMINELP, A1GS, A2GS, BETS, BETA2SER, GAMS, MSPIKE, SPEI  No results found for: KPAFRELGTCHN, LAMBDASER, KAPLAMBRATIO  Lab Results  Component Value Date   WBC 6.5 11/26/2018   NEUTROABS 4.3 11/26/2018   HGB 11.9 (L) 11/26/2018   HCT 36.1 11/26/2018  MCV 101.4 (H) 11/26/2018   PLT 180 11/26/2018      Chemistry      Component Value Date/Time   NA 139 11/26/2018 1038   NA 140 03/21/2017 0918   K 3.9 11/26/2018 1038   K 3.7 03/21/2017 0918   CL 105 11/26/2018 1038   CO2 24 11/26/2018 1038   CO2 23 03/21/2017 0918   BUN 11 11/26/2018 1038   BUN 6.6 (L) 03/21/2017 0918   CREATININE 0.74 11/26/2018 1038   CREATININE 0.6 03/21/2017 0918      Component Value Date/Time   CALCIUM 8.9 11/26/2018 1038   CALCIUM 8.6 03/21/2017 0918   ALKPHOS 96 11/26/2018 1038   ALKPHOS 123 03/21/2017 0918   AST 28 11/26/2018 1038   AST 53 (H) 03/21/2017 0918   ALT 24 11/26/2018 1038   ALT 29 03/21/2017 0918   BILITOT 0.7 11/26/2018 1038   BILITOT 0.55 03/21/2017 0918       No results found for: LABCA2  No components found for: XVQMGQ676  No results for input(s): INR in the last 168 hours.  Urinalysis    Component Value Date/Time   COLORURINE YELLOW 11/06/2017 1116   APPEARANCEUR CLEAR 11/06/2017 1116   LABSPEC 1.020 11/06/2017 1116   PHURINE 5.0 11/06/2017 1116   GLUCOSEU NEGATIVE 11/06/2017 1116   GLUCOSEU NEGATIVE 01/25/2010 0858   HGBUR NEGATIVE 11/06/2017 1116   HGBUR 1+ 12/30/2008 0838   BILIRUBINUR NEGATIVE 11/06/2017 1116   BILIRUBINUR Neg 04/28/2015 1122   KETONESUR NEGATIVE 11/06/2017 1116   PROTEINUR NEGATIVE 11/06/2017 1116   UROBILINOGEN 0.2 04/28/2015 1122   UROBILINOGEN 0.2 01/25/2010 0858   NITRITE NEGATIVE 11/06/2017 1116   LEUKOCYTESUR SMALL (A) 11/06/2017 1116      STUDIES: No results found.   ELIGIBLE FOR AVAILABLE RESEARCH PROTOCOL: no  ASSESSMENT: 74 y.o. 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 Results are negative for pathogenic mutations in 46 genes analyzed by [facility]'s Common Hereditary Cancers Panel. Results are dated 11/26/2016. Genes tested: APC, ATM, AXIN2, BARD1, BMPR1A, BRCA1, BRCA2, BRIP1, CDH1, CDKN2A, CHEK2, CTNNA1, DICER1, EPCAM, GREM1, HOXB13, KIT, MEN1, MLH1, MSH2, MSH3, MSH6, MUTYH, NBN, NF1, NTHL1, PALB2, PDGFRA, PMS2, POLD1, POLE, PTEN, RAD50, RAD51C, RAD51D, SDHA, SDHB, SDHC, SDHD, SMAD4, SMARCA4, STK11, TP53, TSC1, TSC2, and VHL.  (a) A variant of uncertain significance (not clinically actionable) was noted in CTNNA1.    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 There was a 4 cm right breast mass with right axillary lymph nodes and evidence of cirrhosis.  (3) neoadjuvant treatment to consist of cyclophosphamide, methotrexate, and fluorouracil (CMF) chemotherapy every 21 days 8, starting 11/05/2016  (4) Bilateral mastectomies on 04/04/2017 left breast: DCIS, 1.2 cm, margins negative, right Breast: IMC predominantly lobular, grade 2, 8 cm, Posterior margin focally positive, 10/10 lymph noes involved.  Both are ER/PR positive, invasive tumor was HER-2 negative (ratio 1.48).  (a) the patient is interested in eventual reconstruction  (5) adjuvant radiation  from 06/02/2017-07/10/2017: 1) Right chest wall/ 50.4 Gy in 28 fractions.  2) L-spine/ 30  Gy in 10 fractions  (6) Anastrozole started 09/2017  (a) bone density 12/18/2017 showed a T score of -0.7 normal (although scan quality was limited and lumbar spine was not utilized due to advanced degenerative changes. )  (7) Xgeva restarted on 11/28/17, given monthly; changed to every 6 months after January 2020 dose   PLAN: Madina did not show for her 05/31/2019 visit.  A letter asking her to reschedule has been sent.   Ercell Razon, Virgie Dad, MD  05/31/19 1:10 PM Medical Oncology and Hematology [facility] [place], [place] [address] Tel. [phone]    Fax. [phone]   I, Wilburn Mylar, am acting as scribe for Dr. Virgie Dad. Ignatz Deis.  I, Lurline Del MD, have reviewed the above documentation for accuracy and completeness, and I agree with the above.   *Total Encounter Time as defined by the Centers for Medicare and Medicaid Services includes, in addition to the face-to-face time of a patient visit (documented in the note above) non-face-to-face time: obtaining and reviewing outside history, ordering and reviewing medications, tests or procedures, care coordination (communications with other health care professionals or caregivers) and documentation in the medical record.

## 2019-05-31 ENCOUNTER — Inpatient Hospital Stay: Payer: Medicare Other

## 2019-05-31 ENCOUNTER — Encounter: Payer: Self-pay | Admitting: Oncology

## 2019-05-31 ENCOUNTER — Inpatient Hospital Stay: Payer: Medicare Other | Attending: Oncology

## 2019-05-31 ENCOUNTER — Inpatient Hospital Stay (HOSPITAL_BASED_OUTPATIENT_CLINIC_OR_DEPARTMENT_OTHER): Payer: Medicare Other | Admitting: Oncology

## 2019-05-31 DIAGNOSIS — G4733 Obstructive sleep apnea (adult) (pediatric): Secondary | ICD-10-CM

## 2019-05-31 DIAGNOSIS — Z79899 Other long term (current) drug therapy: Secondary | ICD-10-CM | POA: Insufficient documentation

## 2019-05-31 DIAGNOSIS — Z170 Estrogen receptor positive status [ER+]: Secondary | ICD-10-CM | POA: Insufficient documentation

## 2019-05-31 DIAGNOSIS — C50811 Malignant neoplasm of overlapping sites of right female breast: Secondary | ICD-10-CM

## 2019-05-31 DIAGNOSIS — Z6841 Body Mass Index (BMI) 40.0 and over, adult: Secondary | ICD-10-CM

## 2019-05-31 DIAGNOSIS — C7951 Secondary malignant neoplasm of bone: Secondary | ICD-10-CM

## 2019-05-31 DIAGNOSIS — K7469 Other cirrhosis of liver: Secondary | ICD-10-CM

## 2019-05-31 DIAGNOSIS — C50919 Malignant neoplasm of unspecified site of unspecified female breast: Secondary | ICD-10-CM | POA: Insufficient documentation

## 2019-05-31 DIAGNOSIS — Z9013 Acquired absence of bilateral breasts and nipples: Secondary | ICD-10-CM | POA: Insufficient documentation

## 2019-05-31 DIAGNOSIS — Z79811 Long term (current) use of aromatase inhibitors: Secondary | ICD-10-CM | POA: Insufficient documentation

## 2019-05-31 DIAGNOSIS — Z8673 Personal history of transient ischemic attack (TIA), and cerebral infarction without residual deficits: Secondary | ICD-10-CM | POA: Insufficient documentation

## 2019-06-08 ENCOUNTER — Telehealth: Payer: Self-pay | Admitting: *Deleted

## 2019-06-08 NOTE — Telephone Encounter (Signed)
VM received from pt requesting to reschedule appointments from 1/11 she missed.  Above request sent to scheduling.

## 2019-06-09 ENCOUNTER — Telehealth: Payer: Self-pay | Admitting: Oncology

## 2019-06-09 NOTE — Telephone Encounter (Signed)
Scheduled appt per 1/19 sch message - pt is aware of apt date and time

## 2019-06-13 NOTE — Progress Notes (Signed)
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  Because of the pandemic her daughter is not with her today  Jasmine Hood continues on anastrozole which she calls her "keep me alive pill".  She does not have problems with hot flashes or vaginal dryness from this  She also received denosumab/Xgeva.  Her last dose was 11/26/2018.  This is supposedly given every 3 months.  She is scheduled for dose today   Her most recent bone density was 12/18/2017 and showed a T-score of -0.7, which is normal.  She had excess breast tissue removed by Dr. Marla Roe 10/28/2018 with benign pathology ([id number]), and removal of a left buttock lesion 12/15/2018 which turned out to be a verruca vulgaris, irritated ([id number]).  She also underwent staging scans on 06/09/2019. Chest CT showed: regressed mediastinal lymphadenopathy, suggesting benign etiology on prior study; widespread metastatic disease to bones re-demonstrated, increasingly sclerotic compared to prior study likely to reflect healed metastases; no pulmonary metastatic disease; interstitial lung disease.  Bone scan also performed that day showed: likely osseous metastatic disease accounting for uptake in left calvarium, manubrium, and proximal  left humerus; unchanged additional sites of stable uptake; no new sites of abnormal uptake.   REVIEW OF SYSTEMS: Jasmine Hood has me her right arm swells sometimes, not all the time.  Last week in addition to swelling it had a little bit of redness in the forearm section.  That resolved on its own.  She did not call to tell us about that.  She has had no unusual headaches visual changes nausea vomiting cough phlegm production pleurisy falls, bleeding, fever, or rash.  A detailed review of systems today was otherwise stable.   BREAST CANCER HISTORY: From the original intake note  Jasmine Hood herself noted a change in her right breast sometime in September 2017. She did not immediately bring it to medical attention. When she noted some significant changes in her right nipple she saw [name] and was set up for bilateral diagnostic mammography with tomography and [facility]. This found the breast density to be category B. In the upper and lower outer quadrants of the right breast there was a mass measuring at least 10 cm. There were also groups of heterogeneous calcifications measuring 3 cm and a separate group 0.4 cm. On physical exam there was a palpable firm mass measuring approximately 10 cm involving the upper outer and lower outer quadrant of the right breast, with skin reaction and nipple retraction. There was no palpable right axillary adenopathy.  Right breast ultrasonography confirmed a large hypoechoic mass with irregular margins extending to the skin surface. The right axilla showed 2 lymph nodes which appeared abnormal.  In the left breast there were some indeterminate retroareolar calcifications which were felt to warrant biopsy. This was performed 10/08/2016 and showed a fibroadenoma ([id number]).  Biopsy of  the right breast mass at the 7:00 and 10:00 positions as well as one of the suspicious lymph nodes all showed invasive ductal carcinoma,  E-cadherin positive. Separate prognostic profiles from the 2 breast masses were sent. Estrogen receptor was positive at 95-70%, progesterone receptor was positive at 15-85%, all with strong staining intensity, the MIB-1 was 15-20%, and HER-2 was nonamplified, the signals ratio being 1.28-1.72 and the number per cell 1.85-3.95.  The patient's subsequent history is as detailed below   PAST MEDICAL HISTORY: Past Medical History:  Diagnosis Date  . Breast cancer ([place]) 10/07/2016   right breast, spine  . Brown recluse spider bite    right leg  . COLONIC POLYPS, HX OF 01/05/2009   Qualifier: Diagnosis of  By: [name]  MD, [name]   . Complication of anesthesia    slow to awaken after wisdom  teeth extraction age 64  . Constipation   . Genetic testing 11/27/2016   Ms. Tung underwent genetic counseling and testing for hereditary cancer syndromes on 11/19/2016. Her results were negative for mutations in all 46 genes analyzed by [facility]'s 46-gene Common Hereditary Cancers Panel. Genes analyzed include: APC, ATM, AXIN2, BARD1, BMPR1A, BRCA1, BRCA2, BRIP1, CDH1, CDKN2A, CHEK2, CTNNA1, DICER1, EPCAM, GREM1, HOXB13, KIT, MEN1, MLH1, MSH2, MSH3, MSH6, MUTYH, NBN,  . Morbid obesity ([place]) 01/05/2009   Qualifier: Diagnosis of  By: [name]  MD, [name]   . Pneumonia 1987  . Sleep apnea    no cpap yet, has not recieved results from home test yet  . Stroke [facility])    found on CT scan, no deficits    PAST SURGICAL HISTORY: Past Surgical History:  Procedure Laterality Date  . BILATERAL TOTAL MASTECTOMY WITH AXILLARY LYMPH NODE DISSECTION Bilateral 04/04/2017   Procedure: RIGHT MASTECTOMY WITH TARGETED RIGHT AXILLARY NODE DISSECTION AND LEFT PROPHYLATIC MASTECTOMY ERAS PATHWAY;  Surgeon: Alphonsa Overall, MD;  Location: [facility];  Service: General;  Laterality: Bilateral;  PEC BLOCK  . BREAST BIOPSY Right 10/07/2016   invasive mammary carcinoma  . BREAST BIOPSY Left 10/08/2016   breast fibroadenoma no  malignancy  . BREAST REDUCTION SURGERY Bilateral 10/28/2018   Procedure: Excision of bilateral excess breast tissue;  Surgeon: [name], DO;  Location: [facility];  Service: Plastics;  Laterality: Bilateral;  2 hours, please.  Marland Kitchen CATARACT EXTRACTION Bilateral   . COLONOSCOPY W/ BIOPSIES AND POLYPECTOMY  2012  . COLONOSCOPY WITH PROPOFOL N/A 12/22/2015   Procedure: COLONOSCOPY WITH PROPOFOL;  Surgeon: [name], MD;  Location: [facility];  Service: Endoscopy;  Laterality: N/A;  . INCISION AND DRAINAGE Right 1998   surgery for spider bite on RLE; "[name]"  . MASTECTOMY Left 04/04/2017   [name]  . MASTECTOMY WITH AXILLARY LYMPH NODE DISSECTION Right 04/04/2017  . MOLE REMOVAL  10/29/2016   Procedure: MOLE REMOVAL LEFT CHEST;  Surgeon: Alphonsa Overall, MD;  Location: [facility];  Service: General;;  . PORT-A-CATH REMOVAL Left 08/05/2018   Procedure: REMOVAL PORT-A-CATH;  Surgeon: [name], DO;  Location: [facility];  Service: Plastics;  Laterality: Left;  . PORTACATH PLACEMENT N/A 10/29/2016   Procedure: INSERTION PORT-A-CATH;  Surgeon: Alphonsa Overall, MD;  Location: [facility];  Service: General;  Laterality: N/A;  . TUBAL LIGATION    . WISDOM TOOTH EXTRACTION  age 34    FAMILY HISTORY Family History  Problem Relation Age of Onset  . Breast cancer Mother 15       d.52  from metastatic disease  . Throat cancer Father        d.65 history of smoking  . Rheum arthritis Sister        3 sisters pos. for osteo and RA  . Diabetes Maternal Aunt   . Cancer Maternal Grandfather        mouth cancer-chewed tobacco  . Breast cancer Cousin 64  . Breast cancer Daughter 31  . Stomach cancer Sister 42  The patient's father died at the age of 73 from laryngeal cancer in the setting of tobacco abuse. The patient's mother died at the age of 34 from metastatic breast cancer which had been diagnosed in her early 65s. The patient has 2 half-sisters  and one half-brother. One of her sisters has "stomach" cancer. There is also a cousin on the maternal side with breast cancer diagnosed at age 19. No family member has been genetically tested yet   GYNECOLOGIC HISTORY:  No LMP recorded. Patient is postmenopausal. Menarche age 12, first live birth age 79, she is [place] P5. She is status post bilateral tubal ligation. She stopped having periods in 1985. She tells me she did not use hormone replacement. However a note from her bone density scan December 2001 states "this patient began hormone replacement therapy 3 months ago"   SOCIAL HISTORY:  She is originally from [place]. She worked for [employer], but she is now retired. She has survived 2 husbands. At home currently is just she and her oldest daughter Jasmine Hood. She tells me this daughter had mild brain damage at birth and is not working, and cannot drive because of a history of seizures. However she is the one she is planning to name is her healthcare power of attorney. The patient has 13 grandchildren and 4 great-grandchildren. She attends a [employer]: At the 10/21/2016 visit the patient was given the appropriate documents to complete and notarize at her discretion.  She intends to name her youngest duaghter Jasmine Hood, lives in [place], as her healthcare power of attorney.    HEALTH MAINTENANCE: Social History   Tobacco Use  . Smoking status: Never Smoker  . Smokeless tobacco: Never Used  Substance Use Topics  . Alcohol use: No  . Drug use: No     Colonoscopy: 12/22/2015/Nandigam  PAP:  Bone density: 12/18/2017 with a T score of -0.7   Allergies  Allergen Reactions  . Bee Venom Anaphylaxis  . Latex Swelling    Current Outpatient Medications  Medication Sig Dispense Refill  . anastrozole (ARIMIDEX) 1 MG tablet Take 1 tablet (1 mg total) by mouth daily. 90 tablet 4  . calcium carbonate (TUMS) 500 MG chewable tablet Chew  1 tablet (200 mg of elemental calcium total) by mouth 2 (two) times daily. 120 tablet 4  . doxycycline (VIBRA-TABS) 100 MG tablet Take 1 tablet (100 mg total) by mouth 2 (two) times daily.     No current facility-administered medications for this visit.    OBJECTIVE: Morbidly obese white woman who appears stated age  34:   06/14/19 1315  BP: (!) 151/97  Pulse: 98  Resp: 18  Temp: 98.5 F (36.9 C)  SpO2: 97%     Body mass index is 46.77 kg/m.     Sclerae unicteric, EOMs intact Wearing a mask No cervical or supraclavicular adenopathy Lungs no rales or rhonchi Heart regular rate and rhythm Abd soft, nontender, positive bowel sounds MSK no focal spinal tenderness, no upper  extremity lymphedema Neuro: nonfocal, well oriented, appropriate affect Breasts: Status post bilateral mastectomies and status post revision of the scar.  There is no evidence of local recurrence.  Both axillae are benign.   LAB RESULTS:  CMP     Component Value Date/Time   NA 141 06/14/2019 1256   NA 140 03/21/2017 0918   K 3.9 06/14/2019 1256   K 3.7 03/21/2017 0918   CL 105 06/14/2019 1256   CO2 24 06/14/2019 1256   CO2 23 03/21/2017 0918   GLUCOSE 143 (H) 06/14/2019 1256   GLUCOSE 120 03/21/2017 0918   BUN 10 06/14/2019 1256   BUN 6.6 (L) 03/21/2017 0918   CREATININE 0.72 06/14/2019 1256   CREATININE 0.6 03/21/2017 0918   CALCIUM 10.0 06/14/2019 1256   CALCIUM 8.6 03/21/2017 0918   PROT 7.7 06/14/2019 1256   PROT 6.3 (L) 03/21/2017 0918   ALBUMIN 3.4 (L) 06/14/2019 1256   ALBUMIN 2.4 (L) 03/21/2017 0918   AST 92 (H) 06/14/2019 1256   AST 53 (H) 03/21/2017 0918   ALT 67 (H) 06/14/2019 1256   ALT 29 03/21/2017 0918   ALKPHOS 146 (H) 06/14/2019 1256   ALKPHOS 123 03/21/2017 0918   BILITOT 0.7 06/14/2019 1256   BILITOT 0.55 03/21/2017 0918   GFRNONAA >60 06/14/2019 1256   GFRAA >60 06/14/2019 1256    No results found for: Ronnald Ramp, A1GS, A2GS, BETS, BETA2SER, GAMS,  MSPIKE, SPEI  No results found for: Nils Pyle, [facility]  Lab Results  Component Value Date   WBC 6.5 06/14/2019   NEUTROABS 4.6 06/14/2019   HGB 14.7 06/14/2019   HCT 44.6 06/14/2019   MCV 101.6 (H) 06/14/2019   PLT 174 06/14/2019      Chemistry      Component Value Date/Time   NA 141 06/14/2019 1256   NA 140 03/21/2017 0918   K 3.9 06/14/2019 1256   K 3.7 03/21/2017 0918   CL 105 06/14/2019 1256   CO2 24 06/14/2019 1256   CO2 23 03/21/2017 0918   BUN 10 06/14/2019 1256   BUN 6.6 (L) 03/21/2017 0918   CREATININE 0.72 06/14/2019 1256   CREATININE 0.6 03/21/2017 0918      Component Value Date/Time   CALCIUM 10.0 06/14/2019 1256   CALCIUM 8.6 03/21/2017 0918   ALKPHOS 146 (H) 06/14/2019 1256   ALKPHOS 123 03/21/2017 0918   AST 92 (H) 06/14/2019 1256   AST 53 (H) 03/21/2017 0918   ALT 67 (H) 06/14/2019 1256   ALT 29 03/21/2017 0918   BILITOT 0.7 06/14/2019 1256   BILITOT 0.55 03/21/2017 0918       No results found for: LABCA2  No components found for: [id number]  No results for input(s): INR in the last 168 hours.  Urinalysis    Component Value Date/Time   COLORURINE YELLOW 11/06/2017 1116   APPEARANCEUR CLEAR 11/06/2017 1116   LABSPEC 1.020 11/06/2017 1116   PHURINE 5.0 11/06/2017 1116   GLUCOSEU NEGATIVE 11/06/2017 1116   GLUCOSEU NEGATIVE 01/25/2010 0858   HGBUR NEGATIVE 11/06/2017 1116   HGBUR 1+ 12/30/2008 0838   BILIRUBINUR NEGATIVE 11/06/2017 1116   BILIRUBINUR Neg 04/28/2015 1122   KETONESUR NEGATIVE 11/06/2017 1116   PROTEINUR NEGATIVE 11/06/2017 1116   UROBILINOGEN 0.2 04/28/2015 1122   UROBILINOGEN 0.2 01/25/2010 0858   NITRITE NEGATIVE 11/06/2017 1116   LEUKOCYTESUR SMALL (A) 11/06/2017 1116     STUDIES: No results found.   ELIGIBLE FOR AVAILABLE RESEARCH PROTOCOL: no  ASSESSMENT: 74 y.o. Jasmine Hood, Jasmine Hood  woman status post right breast overlapping sites biopsy 2 and lymph node biopsy 10/07/2016 all positive for an  invasive ductal carcinoma, grade 2, estrogen and progesterone receptor positive, HER-2 nonamplified, with an MIB-1 between 15 and 20%.--This is clinical stage IIIB  (a) breast MRI suggests left-sided disease, biopsy 11/18/2016 shows atypical ductal hyperplasia  (1) genetics testing 11/19/2016--Genetic counseling and testing for hereditary cancer syndromes performed on 11/19/2016. Results are negative for pathogenic mutations in 46 genes analyzed by [facility]'s Common Hereditary Cancers Panel. Results are dated 11/26/2016. Genes tested: APC, ATM, AXIN2, BARD1, BMPR1A, BRCA1, BRCA2, BRIP1, CDH1, CDKN2A, CHEK2, CTNNA1, DICER1, EPCAM, GREM1, HOXB13, KIT, MEN1, MLH1, MSH2, MSH3, MSH6, MUTYH, NBN, NF1, NTHL1, PALB2, PDGFRA, PMS2, POLD1, POLE, PTEN, RAD50, RAD51C, RAD51D, SDHA, SDHB, SDHC, SDHD, SMAD4, SMARCA4, STK11, TP53, TSC1, TSC2, and VHL.  (a) A variant of uncertain significance (not clinically actionable) was noted in CTNNA1.    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 There was a 4 cm right breast mass with right axillary lymph nodes and evidence of cirrhosis.  (3) neoadjuvant treatment consisting of cyclophosphamide, methotrexate, and fluorouracil (CMF) chemotherapy every 21 days 6, starting 11/05/2016, last dose 02/18/2017  (4) Bilateral mastectomies on 04/04/2017 left breast: DCIS, 1.2 cm, margins negative, right Breast: IMC predominantly lobular, grade 2, 8 cm, Posterior margin focally positive, 10/10 lymph noes involved.  Both are ER/PR positive, invasive tumor was HER-2 negative (ratio 1.48).  (a) the patient is status post revision of some of the scar irregularities under Dr. Marla Roe 10/28/2018  (5) adjuvant radiation from 06/02/2017-07/10/2017: 1) Right chest wall/ 50.4 Gy in 28 fractions.  2) L-spine/ 30 Gy in 10 fractions  (6) anastrozole started 09/2017  (a) bone density  12/18/2017 showed a T score of -0.7 normal (although scan quality was limited and lumbar spine was not utilized due to advanced degenerative changes. )  (b)  denosumab/Xgeva restarted on 11/28/17, given monthly; changed to every 6 months after January 2020 dose  (7) staging studies: CT of the chest with contrast and bone scan 06/08/2018 shows stable bony, no visceral disease disease   PLAN: Jasmine Hood is now a little over 2 years out from definitive surgery for her metastatic breast cancer.  Her disease is well controlled on her current medications and she is tolerating them well.  In particular she tolerates the anastrozole with no significant side effects and she has had no problems from the denosumab/[place] which she is now receiving every 12 weeks.  We reviewed the scans which are very favorable as noted.  I encouraged her to receive the vaccine as soon as she can enroll for it although she herself appears doubtful.  She is taking very strict pandemic precautions.  She has normal activity for her mostly doing housework and a little shopping together with her daughter.  I have written her a compression sleeve for her right arm and given her instructions on how to use it.  I also wrote her for doxycycline to take if she develops redness in the right arm again.  I asked her to call us before starting it.  Otherwise the plan will be to continue as we are doing.  She will return in 3 months for labs and Xgeva and then in 6 months for the same plus a visit.  She knows to call for any other problem that may develop before then.  Total encounter time 35 minutes.*  Nevelyn Mellott, Virgie Dad, MD  06/14/19 1:48 PM Medical Oncology and Hematology [facility] [place], [place] [address] Tel. [phone]    Fax. [phone]   I, Wilburn Mylar, am acting as scribe for Dr. Virgie Dad. Yeng Perz.  I, Lurline Del MD, have reviewed the above documentation for accuracy and  completeness, and I agree with the above.   *Total Encounter Time as defined by the Centers for Medicare and Medicaid Services includes, in addition to the face-to-face time of a patient visit (documented in the note above) non-face-to-face time: obtaining and reviewing outside history, ordering and reviewing medications, tests or procedures, care coordination (communications with other health care professionals or caregivers) and documentation in the medical record.

## 2019-06-14 ENCOUNTER — Inpatient Hospital Stay (HOSPITAL_BASED_OUTPATIENT_CLINIC_OR_DEPARTMENT_OTHER): Payer: Medicare Other | Admitting: Oncology

## 2019-06-14 ENCOUNTER — Other Ambulatory Visit: Payer: Self-pay

## 2019-06-14 ENCOUNTER — Inpatient Hospital Stay: Payer: Medicare Other

## 2019-06-14 VITALS — BP 151/97 | HR 98 | Temp 98.5°F | Resp 18 | Ht 59.5 in | Wt 235.5 lb

## 2019-06-14 DIAGNOSIS — K7469 Other cirrhosis of liver: Secondary | ICD-10-CM

## 2019-06-14 DIAGNOSIS — C7951 Secondary malignant neoplasm of bone: Secondary | ICD-10-CM | POA: Diagnosis not present

## 2019-06-14 DIAGNOSIS — C50811 Malignant neoplasm of overlapping sites of right female breast: Secondary | ICD-10-CM

## 2019-06-14 DIAGNOSIS — G4733 Obstructive sleep apnea (adult) (pediatric): Secondary | ICD-10-CM

## 2019-06-14 DIAGNOSIS — E038 Other specified hypothyroidism: Secondary | ICD-10-CM

## 2019-06-14 DIAGNOSIS — Z6841 Body Mass Index (BMI) 40.0 and over, adult: Secondary | ICD-10-CM

## 2019-06-14 DIAGNOSIS — Z9013 Acquired absence of bilateral breasts and nipples: Secondary | ICD-10-CM | POA: Diagnosis not present

## 2019-06-14 DIAGNOSIS — Z79811 Long term (current) use of aromatase inhibitors: Secondary | ICD-10-CM | POA: Diagnosis not present

## 2019-06-14 DIAGNOSIS — Z170 Estrogen receptor positive status [ER+]: Secondary | ICD-10-CM

## 2019-06-14 DIAGNOSIS — Z79899 Other long term (current) drug therapy: Secondary | ICD-10-CM | POA: Diagnosis not present

## 2019-06-14 DIAGNOSIS — Z95828 Presence of other vascular implants and grafts: Secondary | ICD-10-CM

## 2019-06-14 DIAGNOSIS — Z8673 Personal history of transient ischemic attack (TIA), and cerebral infarction without residual deficits: Secondary | ICD-10-CM | POA: Diagnosis not present

## 2019-06-14 DIAGNOSIS — N179 Acute kidney failure, unspecified: Secondary | ICD-10-CM

## 2019-06-14 DIAGNOSIS — C50919 Malignant neoplasm of unspecified site of unspecified female breast: Secondary | ICD-10-CM | POA: Diagnosis not present

## 2019-06-14 LAB — COMPREHENSIVE METABOLIC PANEL
ALT: 67 U/L — ABNORMAL HIGH (ref 0–44)
AST: 92 U/L — ABNORMAL HIGH (ref 15–41)
Albumin: 3.4 g/dL — ABNORMAL LOW (ref 3.5–5.0)
Alkaline Phosphatase: 146 U/L — ABNORMAL HIGH (ref 38–126)
Anion gap: 12 (ref 5–15)
BUN: 10 mg/dL (ref 8–23)
CO2: 24 mmol/L (ref 22–32)
Calcium: 10 mg/dL (ref 8.9–10.3)
Chloride: 105 mmol/L (ref 98–111)
Creatinine, Ser: 0.72 mg/dL (ref 0.44–1.00)
GFR calc Af Amer: >60 mL/min (ref >60)
GFR calc non Af Amer: >60 mL/min (ref >60)
Glucose, Bld: 143 mg/dL — ABNORMAL HIGH (ref 70–99)
Potassium: 3.9 mmol/L (ref 3.5–5.1)
Sodium: 141 mmol/L (ref 135–145)
Total Bilirubin: 0.7 mg/dL (ref 0.3–1.2)
Total Protein: 7.7 g/dL (ref 6.5–8.1)

## 2019-06-14 LAB — CBC WITH DIFFERENTIAL/PLATELET
Abs Immature Granulocytes: 0.02 10*3/uL (ref 0.00–0.07)
Basophils Absolute: 0 10*3/uL (ref 0.0–0.1)
Basophils Relative: 0 %
Eosinophils Absolute: 0.1 10*3/uL (ref 0.0–0.5)
Eosinophils Relative: 2 %
HCT: 44.6 % (ref 36.0–46.0)
Hemoglobin: 14.7 g/dL (ref 12.0–15.0)
Immature Granulocytes: 0 %
Lymphocytes Relative: 22 %
Lymphs Abs: 1.5 10*3/uL (ref 0.7–4.0)
MCH: 33.5 pg (ref 26.0–34.0)
MCHC: 33 g/dL (ref 30.0–36.0)
MCV: 101.6 fL — ABNORMAL HIGH (ref 80.0–100.0)
Monocytes Absolute: 0.4 10*3/uL (ref 0.1–1.0)
Monocytes Relative: 6 %
Neutro Abs: 4.6 10*3/uL (ref 1.7–7.7)
Neutrophils Relative %: 70 %
Platelets: 174 10*3/uL (ref 150–400)
RBC: 4.39 MIL/uL (ref 3.87–5.11)
RDW: 13.3 % (ref 11.5–15.5)
WBC: 6.5 10*3/uL (ref 4.0–10.5)
nRBC: 0 % (ref 0.0–0.2)

## 2019-06-14 MED ORDER — DENOSUMAB 120 MG/1.7ML ~~LOC~~ SOLN
SUBCUTANEOUS | Status: AC
  Filled 2019-06-14: qty 1.7

## 2019-06-14 MED ORDER — DENOSUMAB 120 MG/1.7ML ~~LOC~~ SOLN
120.0000 mg | Freq: Once | SUBCUTANEOUS | Status: AC
  Administered 2019-06-14: 120 mg via SUBCUTANEOUS

## 2019-06-14 MED ORDER — ANASTROZOLE 1 MG PO TABS: 1.0000 mg | ORAL_TABLET | Freq: Every day | ORAL | 4 refills | Status: DC

## 2019-06-14 NOTE — Patient Instructions (Signed)

## 2019-06-15 ENCOUNTER — Telehealth: Payer: Self-pay | Admitting: Oncology

## 2019-06-15 NOTE — Telephone Encounter (Signed)
Scheduled per 1/25 los. Called pt no answer and unable to leave a msg. Mailing printout

## 2019-09-06 ENCOUNTER — Inpatient Hospital Stay: Payer: Medicare Other | Attending: Oncology

## 2019-09-06 ENCOUNTER — Inpatient Hospital Stay: Payer: Medicare Other

## 2019-11-10 ENCOUNTER — Inpatient Hospital Stay (HOSPITAL_COMMUNITY)
Admission: EM | Admit: 2019-11-10 | Discharge: 2019-11-13 | DRG: 872 | Disposition: A | Discharge status: Home / Self Care | Payer: Medicare Other | Attending: Internal Medicine | Admitting: Internal Medicine

## 2019-11-10 ENCOUNTER — Emergency Department (HOSPITAL_COMMUNITY): Payer: Medicare Other

## 2019-11-10 ENCOUNTER — Encounter (HOSPITAL_COMMUNITY): Payer: Self-pay

## 2019-11-10 ENCOUNTER — Other Ambulatory Visit: Payer: Self-pay

## 2019-11-10 DIAGNOSIS — R7401 Elevation of levels of liver transaminase levels: Secondary | ICD-10-CM | POA: Diagnosis not present

## 2019-11-10 DIAGNOSIS — R52 Pain, unspecified: Secondary | ICD-10-CM | POA: Diagnosis not present

## 2019-11-10 DIAGNOSIS — Z853 Personal history of malignant neoplasm of breast: Secondary | ICD-10-CM | POA: Diagnosis not present

## 2019-11-10 DIAGNOSIS — R6510 Systemic inflammatory response syndrome (SIRS) of non-infectious origin without acute organ dysfunction: Secondary | ICD-10-CM

## 2019-11-10 DIAGNOSIS — M7989 Other specified soft tissue disorders: Secondary | ICD-10-CM | POA: Diagnosis not present

## 2019-11-10 DIAGNOSIS — Z66 Do not resuscitate: Secondary | ICD-10-CM | POA: Diagnosis not present

## 2019-11-10 DIAGNOSIS — I10 Essential (primary) hypertension: Secondary | ICD-10-CM | POA: Diagnosis present

## 2019-11-10 DIAGNOSIS — L03113 Cellulitis of right upper limb: Secondary | ICD-10-CM | POA: Diagnosis not present

## 2019-11-10 DIAGNOSIS — C7951 Secondary malignant neoplasm of bone: Secondary | ICD-10-CM | POA: Diagnosis not present

## 2019-11-10 DIAGNOSIS — E038 Other specified hypothyroidism: Secondary | ICD-10-CM | POA: Diagnosis not present

## 2019-11-10 DIAGNOSIS — Z170 Estrogen receptor positive status [ER+]: Secondary | ICD-10-CM

## 2019-11-10 DIAGNOSIS — C50811 Malignant neoplasm of overlapping sites of right female breast: Secondary | ICD-10-CM | POA: Diagnosis not present

## 2019-11-10 DIAGNOSIS — Z8673 Personal history of transient ischemic attack (TIA), and cerebral infarction without residual deficits: Secondary | ICD-10-CM

## 2019-11-10 DIAGNOSIS — Z79890 Hormone replacement therapy (postmenopausal): Secondary | ICD-10-CM | POA: Diagnosis not present

## 2019-11-10 DIAGNOSIS — R32 Unspecified urinary incontinence: Secondary | ICD-10-CM | POA: Diagnosis present

## 2019-11-10 DIAGNOSIS — A419 Sepsis, unspecified organism: Secondary | ICD-10-CM | POA: Diagnosis present

## 2019-11-10 DIAGNOSIS — Z9013 Acquired absence of bilateral breasts and nipples: Secondary | ICD-10-CM

## 2019-11-10 DIAGNOSIS — G4733 Obstructive sleep apnea (adult) (pediatric): Secondary | ICD-10-CM | POA: Diagnosis present

## 2019-11-10 DIAGNOSIS — Z6841 Body Mass Index (BMI) 40.0 and over, adult: Secondary | ICD-10-CM | POA: Diagnosis not present

## 2019-11-10 DIAGNOSIS — M79603 Pain in arm, unspecified: Secondary | ICD-10-CM | POA: Diagnosis not present

## 2019-11-10 DIAGNOSIS — Z20822 Contact with and (suspected) exposure to covid-19: Secondary | ICD-10-CM | POA: Diagnosis not present

## 2019-11-10 DIAGNOSIS — R509 Fever, unspecified: Secondary | ICD-10-CM | POA: Diagnosis not present

## 2019-11-10 DIAGNOSIS — R0902 Hypoxemia: Secondary | ICD-10-CM | POA: Diagnosis not present

## 2019-11-10 DIAGNOSIS — E039 Hypothyroidism, unspecified: Secondary | ICD-10-CM | POA: Diagnosis present

## 2019-11-10 DIAGNOSIS — Z79811 Long term (current) use of aromatase inhibitors: Secondary | ICD-10-CM | POA: Diagnosis not present

## 2019-11-10 DIAGNOSIS — E872 Acidosis: Secondary | ICD-10-CM | POA: Diagnosis not present

## 2019-11-10 DIAGNOSIS — R0689 Other abnormalities of breathing: Secondary | ICD-10-CM | POA: Diagnosis not present

## 2019-11-10 DIAGNOSIS — R079 Chest pain, unspecified: Secondary | ICD-10-CM | POA: Diagnosis not present

## 2019-11-10 HISTORY — DX: Hypothyroidism, unspecified: E03.9

## 2019-11-10 LAB — CBC WITH DIFFERENTIAL/PLATELET
Abs Immature Granulocytes: 0.04 10*3/uL (ref 0.00–0.07)
Basophils Absolute: 0 10*3/uL (ref 0.0–0.1)
Basophils Relative: 0 %
Eosinophils Absolute: 0 10*3/uL (ref 0.0–0.5)
Eosinophils Relative: 0 %
HCT: 42.1 % (ref 36.0–46.0)
Hemoglobin: 13.9 g/dL (ref 12.0–15.0)
Immature Granulocytes: 0 %
Lymphocytes Relative: 12 %
Lymphs Abs: 1.1 10*3/uL (ref 0.7–4.0)
MCH: 34.9 pg — ABNORMAL HIGH (ref 26.0–34.0)
MCHC: 33 g/dL (ref 30.0–36.0)
MCV: 105.8 fL — ABNORMAL HIGH (ref 80.0–100.0)
Monocytes Absolute: 0.2 10*3/uL (ref 0.1–1.0)
Monocytes Relative: 3 %
Neutro Abs: 7.7 10*3/uL (ref 1.7–7.7)
Neutrophils Relative %: 85 %
Platelets: 155 10*3/uL (ref 150–400)
RBC: 3.98 MIL/uL (ref 3.87–5.11)
RDW: 13.5 % (ref 11.5–15.5)
WBC: 9.1 10*3/uL (ref 4.0–10.5)
nRBC: 0 % (ref 0.0–0.2)

## 2019-11-10 LAB — COMPREHENSIVE METABOLIC PANEL
ALT: 59 U/L — ABNORMAL HIGH (ref 0–44)
AST: 98 U/L — ABNORMAL HIGH (ref 15–41)
Albumin: 3.7 g/dL (ref 3.5–5.0)
Alkaline Phosphatase: 122 U/L (ref 38–126)
Anion gap: 12 (ref 5–15)
BUN: 8 mg/dL (ref 8–23)
CO2: 24 mmol/L (ref 22–32)
Calcium: 9.2 mg/dL (ref 8.9–10.3)
Chloride: 100 mmol/L (ref 98–111)
Creatinine, Ser: 0.57 mg/dL (ref 0.44–1.00)
GFR calc Af Amer: >60 mL/min (ref >60)
GFR calc non Af Amer: >60 mL/min (ref >60)
Glucose, Bld: 168 mg/dL — ABNORMAL HIGH (ref 70–99)
Potassium: 4 mmol/L (ref 3.5–5.1)
Sodium: 136 mmol/L (ref 135–145)
Total Bilirubin: 1.5 mg/dL — ABNORMAL HIGH (ref 0.3–1.2)
Total Protein: 7.8 g/dL (ref 6.5–8.1)

## 2019-11-10 LAB — SARS CORONAVIRUS 2 BY RT PCR (HOSPITAL ORDER, PERFORMED IN ~~LOC~~ HOSPITAL LAB): SARS Coronavirus 2: NEGATIVE

## 2019-11-10 LAB — PROTIME-INR
INR: 1.1 (ref 0.8–1.2)
Prothrombin Time: 13.7 seconds (ref 11.4–15.2)

## 2019-11-10 LAB — APTT: aPTT: 29 seconds (ref 24–36)

## 2019-11-10 LAB — LACTIC ACID, PLASMA
Lactic Acid, Venous: 2.3 mmol/L (ref 0.5–1.9)
Lactic Acid, Venous: 1.9 mmol/L (ref 0.5–1.9)

## 2019-11-10 MED ORDER — CHLORHEXIDINE GLUCONATE CLOTH 2 % EX PADS
6.0000 | MEDICATED_PAD | Freq: Every day | CUTANEOUS | Status: DC
  Administered 2019-11-11 – 2019-11-13 (×3): 6 via TOPICAL

## 2019-11-10 MED ORDER — ACETAMINOPHEN 650 MG RE SUPP: 650.0000 mg | Freq: Four times a day (QID) | RECTAL | Status: DC | PRN

## 2019-11-10 MED ORDER — HYDROMORPHONE HCL 1 MG/ML IJ SOLN: 0.5000 mg | INTRAMUSCULAR | Status: DC | PRN

## 2019-11-10 MED ORDER — SODIUM CHLORIDE 0.9 % IV SOLN: INTRAVENOUS | Status: AC

## 2019-11-10 MED ORDER — ACETAMINOPHEN 325 MG PO TABS
650.0000 mg | ORAL_TABLET | Freq: Four times a day (QID) | ORAL | Status: DC | PRN
  Administered 2019-11-10: 650 mg via ORAL
  Filled 2019-11-10: qty 2

## 2019-11-10 MED ORDER — POLYETHYLENE GLYCOL 3350 17 G PO PACK: 17.0000 g | PACK | Freq: Every day | ORAL | Status: DC | PRN

## 2019-11-10 MED ORDER — ANASTROZOLE 1 MG PO TABS
1.0000 mg | ORAL_TABLET | Freq: Every day | ORAL | Status: DC
  Administered 2019-11-10 – 2019-11-13 (×4): 1 mg via ORAL
  Filled 2019-11-10 (×4): qty 1

## 2019-11-10 MED ORDER — ONDANSETRON HCL 4 MG/2ML IJ SOLN: 4.0000 mg | Freq: Four times a day (QID) | INTRAMUSCULAR | Status: DC | PRN

## 2019-11-10 MED ORDER — SODIUM CHLORIDE 0.9 % IV SOLN
2.0000 g | INTRAVENOUS | Status: DC
  Administered 2019-11-10 – 2019-11-13 (×4): 2 g via INTRAVENOUS
  Filled 2019-11-10 (×3): qty 2
  Filled 2019-11-10: qty 20

## 2019-11-10 MED ORDER — FENTANYL CITRATE (PF) 100 MCG/2ML IJ SOLN
50.0000 ug | Freq: Once | INTRAMUSCULAR | Status: AC
  Administered 2019-11-10: 50 ug via INTRAVENOUS
  Filled 2019-11-10: qty 2

## 2019-11-10 MED ORDER — SODIUM CHLORIDE 0.9% FLUSH
10.0000 mL | INTRAVENOUS | Status: DC | PRN
  Administered 2019-11-12: 10 mL

## 2019-11-10 MED ORDER — SODIUM CHLORIDE 0.9 % IV BOLUS
500.0000 mL | Freq: Once | INTRAVENOUS | Status: AC
  Administered 2019-11-10: 500 mL via INTRAVENOUS

## 2019-11-10 MED ORDER — ACETAMINOPHEN 325 MG PO TABS
650.0000 mg | ORAL_TABLET | Freq: Once | ORAL | Status: AC
  Administered 2019-11-10: 650 mg via ORAL
  Filled 2019-11-10: qty 2

## 2019-11-10 MED ORDER — ONDANSETRON HCL 4 MG PO TABS: 4.0000 mg | ORAL_TABLET | Freq: Four times a day (QID) | ORAL | Status: DC | PRN

## 2019-11-10 MED ORDER — ENOXAPARIN SODIUM 60 MG/0.6ML ~~LOC~~ SOLN
50.0000 mg | SUBCUTANEOUS | Status: DC
  Administered 2019-11-10 – 2019-11-12 (×3): 50 mg via SUBCUTANEOUS
  Filled 2019-11-10 (×3): qty 0.6

## 2019-11-10 MED ORDER — OXYCODONE HCL 5 MG PO TABS
5.0000 mg | ORAL_TABLET | ORAL | Status: DC | PRN
  Administered 2019-11-11: 5 mg via ORAL
  Filled 2019-11-10 (×2): qty 1

## 2019-11-10 NOTE — H&P (Addendum)
History and Physical  Patient Name: Jasmine Hood     Jasmine Hood:[id number]    DOB: 1946/03/16    DOA: 11/10/2019 PCP: [name], [name], MD  Patient coming from: Home  Chief Complaint: Right arm swelling, redness      HPI: Jasmine Hood is a 74 y.o. 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  The patient was in her usual state of health until few days ago, she started to have pain and redness in her right arm spreading into her right chest wall.  This became progressively worse, got warm, and severe, and then today she developed fever so she came to the emergency room.  No other focal symptoms of urinary irritation/frequency, cough, sputum.  In the ER, temp 101.78F, heart rate 104, lactate 2.3.  Chest x-ray showed no opacities.  Right upper extremity duplex showed no DVT.  She was started on antibiotics and the hospital service were asked to evaluate for admission.           ROS: Review of Systems  Constitutional: Positive for fever and malaise/fatigue.  Respiratory: Negative for cough, sputum production and shortness of breath.   Cardiovascular: Negative for orthopnea, leg swelling and PND.  Gastrointestinal: Negative for abdominal pain, nausea and vomiting.  Genitourinary: Negative for dysuria, frequency, hematuria and urgency.  Skin: Positive for rash.  All other systems reviewed and are negative.         Past Medical History:  Diagnosis Date  . Breast cancer ([place]) 10/07/2016   right breast, spine  . Brown recluse spider bite    right leg  . COLONIC POLYPS, HX OF 01/05/2009   Qualifier: Diagnosis of  By: [name]  MD, [name]   . Complication of anesthesia    slow to awaken after wisdom  teeth extraction age 42  . Constipation   . Genetic testing 11/27/2016   Ms. Frankowski underwent genetic counseling and testing for hereditary cancer syndromes on 11/19/2016. Her results  were negative for mutations in all 46 genes analyzed by [facility]'s 46-gene Common Hereditary Cancers Panel. Genes analyzed include: APC, ATM, AXIN2, BARD1, BMPR1A, BRCA1, BRCA2, BRIP1, CDH1, CDKN2A, CHEK2, CTNNA1, DICER1, EPCAM, GREM1, HOXB13, KIT, MEN1, MLH1, MSH2, MSH3, MSH6, MUTYH, NBN,  . Hypothyroidism   . Morbid obesity ([place]) 01/05/2009   Qualifier: Diagnosis of  By: [name]  MD, [name]   . Pneumonia 1987  . Sleep apnea    no cpap yet, has not recieved results from home test yet  . Stroke [facility])    found on CT scan, no deficits    Past Surgical History:  Procedure Laterality Date  . BILATERAL TOTAL MASTECTOMY WITH AXILLARY LYMPH NODE DISSECTION Bilateral 04/04/2017   Procedure: RIGHT MASTECTOMY WITH TARGETED RIGHT AXILLARY NODE DISSECTION AND LEFT PROPHYLATIC MASTECTOMY ERAS PATHWAY;  Surgeon: Alphonsa Overall, MD;  Location: [place];  Service: General;  Laterality: Bilateral;  PEC BLOCK  . BREAST BIOPSY Right 10/07/2016   invasive mammary carcinoma  . BREAST BIOPSY Left 10/08/2016   breast fibroadenoma no malignancy  . BREAST REDUCTION SURGERY Bilateral 10/28/2018   Procedure: Excision of bilateral excess breast tissue;  Surgeon: [name], DO;  Location: [place];  Service: Plastics;  Laterality: Bilateral;  2 hours, please.  Marland Kitchen CATARACT EXTRACTION Bilateral   . COLONOSCOPY W/ BIOPSIES AND POLYPECTOMY  2012  . COLONOSCOPY WITH PROPOFOL N/A 12/22/2015  Procedure: COLONOSCOPY WITH PROPOFOL;  Surgeon: [name], MD;  [facility];  Service: Endoscopy;  Laterality: N/A;  . INCISION AND DRAINAGE Right 1998   surgery for spider bite on RLE; "[name]"  . MASTECTOMY Left 04/04/2017   [name]  . MASTECTOMY WITH AXILLARY LYMPH NODE DISSECTION Right 04/04/2017  . MOLE REMOVAL  10/29/2016   Procedure: MOLE REMOVAL LEFT CHEST;  Surgeon: Alphonsa Overall, MD;  Location: [facility];  Service: General;;  . PORT-A-CATH REMOVAL Left 08/05/2018    Procedure: REMOVAL PORT-A-CATH;  Surgeon: [name], DO;  Location: [place];  Service: Plastics;  Laterality: Left;  . PORTACATH PLACEMENT N/A 10/29/2016   Procedure: INSERTION PORT-A-CATH;  Surgeon: Alphonsa Overall, MD;  Location: [facility];  Service: General;  Laterality: N/A;  . TUBAL LIGATION    . WISDOM TOOTH EXTRACTION  age 74    Social History: Patient lives with her daughter.  The patient walks unassisted.  Nonsmoker.  Allergies  Allergen Reactions  . Bee Venom Anaphylaxis  . Latex Swelling    Family history: family history includes Breast cancer (age of onset: 95) in her cousin; Breast cancer (age of onset: 15) in her daughter; Breast cancer (age of onset: 61) in her mother; Cancer in her maternal grandfather; Diabetes in her maternal aunt; Rheum arthritis in her sister; Stomach cancer (age of onset: 25) in her sister; Throat cancer in her father.  Prior to Admission medications   Medication Sig Start Date End Date Taking? Authorizing Provider  anastrozole (ARIMIDEX) 1 MG tablet Take 1 tablet (1 mg total) by mouth daily. 06/14/19  Yes Magrinat, Virgie Dad, MD  calcium carbonate (TUMS) 500 MG chewable tablet Chew 1 tablet (200 mg of elemental calcium total) by mouth 2 (two) times daily. Patient not taking: Reported on 11/10/2019 05/21/18   Magrinat, Virgie Dad, MD  doxycycline (VIBRA-TABS) 100 MG tablet Take 1 tablet (100 mg total) by mouth 2 (two) times daily. Patient not taking: Reported on 11/10/2019 06/14/19   Magrinat, Virgie Dad, MD       Physical Exam: BP (!) 145/73   Pulse 79   Temp 98.1 F (36.7 C) (Oral)   Resp (!) 29   Ht 4' 11.5" (1.511 m)   Wt 103 kg   SpO2 94%   BMI 45.08 kg/m  General appearance: Well-developed, obese adult female, alert and in mild distress from right arm pain.   Eyes: Anicteric, conjunctiva pink, lids and lashes normal. PERRL.    ENT: No nasal deformity, discharge, epistaxis.  Hearing normal. OP moist without lesions.   Dentition in good repair.  No oral lesions, lips normal. Neck: No neck masses.  Trachea midline.  No thyromegaly/tenderness. Lymph: No cervical or supraclavicular lymphadenopathy. Skin: Warm and dry.  No jaundice.  No suspicious rashes or lesions.  The right arm has a large beefy red area, spreading from the wrist up the right bicep and right anterior chest.  This is tender, not firm or fluctuant.  Warm. Cardiac: Tachycardic, regular, nl S1-S2, no murmurs appreciated.  Capillary refill is brisk.  JVP not visible.  No LE edema.  Radial pulses 2+ and symmetric. Respiratory: Normal respiratory rate and rhythm.  CTAB without rales or wheezes. Abdomen: Abdomen soft.  No TTP or guarding. No ascites, distension, hepatosplenomegaly.   MSK: No deformities or effusions of the large joints of the upper or lower extremities bilaterally.  No cyanosis or clubbing. Neuro: Cranial nerves 3 through 12 intact.  Sensation intact to light touch. Speech is fluent.  Muscle strength 5/5 and symmetric, strength in the right hand limited by pain.    Psych: Sensorium intact and responding to questions, attention normal.  Behavior appropriate.  Affect anxious.  Judgment and insight appear normal.     Labs on Admission:  I have personally reviewed following labs and imaging studies: CBC: Recent Labs  Lab 11/10/19 1214  WBC 9.1  NEUTROABS 7.7  HGB 13.9  HCT 42.1  MCV 105.8*  PLT 275   Basic Metabolic Panel: Recent Labs  Lab 11/10/19 1214  NA 136  K 4.0  CL 100  CO2 24  GLUCOSE 168*  BUN 8  CREATININE 0.57  CALCIUM 9.2   GFR: Estimated Creatinine Clearance: 65 mL/min (by C-G formula based on SCr of 0.57 mg/dL).  Liver Function Tests: Recent Labs  Lab 11/10/19 1214  AST 98*  ALT 59*  ALKPHOS 122  BILITOT 1.5*  PROT 7.8  ALBUMIN 3.7   No results for input(s): LIPASE, AMYLASE in the last 168 hours. No results for input(s): AMMONIA in the last 168 hours. Coagulation Profile: Recent Labs  Lab  11/10/19 1214  INR 1.1    Sepsis Labs: Lactic acid 2.3, cleared with fluids.  Recent Results (from the past 240 hour(s))  SARS Coronavirus 2 by RT PCR (hospital order, performed in [facility] hospital lab) Nasopharyngeal Nasopharyngeal Swab     Status: None   Collection Time: 11/10/19  3:25 PM   Specimen: Nasopharyngeal Swab  Result Value Ref Range Status   SARS Coronavirus 2 NEGATIVE NEGATIVE Final    Comment: (NOTE) SARS-CoV-2 target nucleic acids are NOT DETECTED.  The SARS-CoV-2 RNA is generally detectable in upper and lower respiratory specimens during the acute phase of infection. The lowest concentration of SARS-CoV-2 viral copies this assay can detect is 250 copies / mL. A negative result does not preclude SARS-CoV-2 infection and should not be used as the sole basis for treatment or other patient management decisions.  A negative result may occur with improper specimen collection / handling, submission of specimen other than nasopharyngeal swab, presence of viral mutation(s) within the areas targeted by this assay, and inadequate number of viral copies (<250 copies / mL). A negative result must be combined with clinical observations, patient history, and epidemiological information.  Fact Sheet for Patients:   StrictlyIdeas.no  Fact Sheet for Healthcare Providers: BankingDealers.co.za  This test is not yet approved or  cleared by the [place] FDA and has been authorized for detection and/or diagnosis of SARS-CoV-2 by FDA under an Emergency Use Authorization (EUA).  This EUA will remain in effect (meaning this test can be used) for the duration of the COVID-19 declaration under Section 564(b)(1) of the Act, 21 U.S.C. section 360bbb-3(b)(1), unless the authorization is terminated or revoked sooner.  Performed at [facility], [place] [address], [place], [place] [address]             Radiological Exams on Admission: Personally reviewed chest x-ray, personally reviewed, showed no airspace disease or opacity, obese.  Venous duplex of the right arm showed no DVT: DG Chest Portable 1 View  Result Date: 11/10/2019 CLINICAL DATA:  Right arm swelling with erythema extending across the arm and chest. Chest pain and fever. History of stage IV breast cancer. EXAM: PORTABLE CHEST 1 VIEW COMPARISON:  Radiographs 05/21/2018 and 02/19/2017.  CT 06/08/2018. FINDINGS: 1351 hours. The heart size and mediastinal contours are stable. The heart size is at  the upper limits of normal for portable technique. Grossly stable volume loss and radiation changes superiorly in the right hemithorax. No airspace disease, pleural effusion or pneumothorax. The bones appear unremarkable. Telemetry leads overlie the chest. IMPRESSION: No acute chest findings or explanation for the patient's symptoms. Grossly stable radiation changes in the right hemithorax. Electronically Signed   By: Richardean Sale M.D.   On: 11/10/2019 14:33   UE VENOUS DUPLEX ([facility] & [facility] 7 am - 7 pm)  Result Date: 11/10/2019 UPPER VENOUS STUDY  Indications: Pain, and Swelling Risk Factors: Cancer. Limitations: Body habitus, poor ultrasound/tissue interface and patient positioning. Comparison Study: No prior studies. Performing Technologist: Oliver Hum RVT  Examination Guidelines: A complete evaluation includes B-mode imaging, spectral Doppler, color Doppler, and power Doppler as needed of all accessible portions of each vessel. Bilateral testing is considered an integral part of a complete examination. Limited examinations for reoccurring indications may be performed as noted.  Right Findings: +----------+------------+---------+-----------+----------+-------+ RIGHT     CompressiblePhasicitySpontaneousPropertiesSummary +----------+------------+---------+-----------+----------+-------+ IJV           Full       Yes       Yes                       +----------+------------+---------+-----------+----------+-------+ Subclavian    Full       Yes       Yes                      +----------+------------+---------+-----------+----------+-------+ Axillary      Full       Yes       Yes                      +----------+------------+---------+-----------+----------+-------+ Brachial      Full       Yes       Yes                      +----------+------------+---------+-----------+----------+-------+ Radial        Full                                          +----------+------------+---------+-----------+----------+-------+ Ulnar         Full                                          +----------+------------+---------+-----------+----------+-------+ Cephalic      Full                                          +----------+------------+---------+-----------+----------+-------+ Basilic       Full                                          +----------+------------+---------+-----------+----------+-------+  Left Findings: +----------+------------+---------+-----------+----------+-------+ LEFT      CompressiblePhasicitySpontaneousPropertiesSummary +----------+------------+---------+-----------+----------+-------+ Subclavian    Full       Yes       Yes                      +----------+------------+---------+-----------+----------+-------+  Summary:  Right: No evidence of deep vein thrombosis in the upper extremity. No evidence of superficial vein thrombosis in the upper extremity.  Left: No evidence of thrombosis in the subclavian.  *See table(s) above for measurements and observations.  Diagnosing physician: Curt Jews MD Electronically signed by Curt Jews MD on 11/10/2019 at 3:43:44 PM.    Final     EKG: Independently reviewed.  Sinus tachycardia, rate 102, no ST changes.       Assessment/Plan   Sepsis from cellulitis of the right arm Red and spreading, suspect erysipelas. -Follow blood  cultures -Continue ceftriaxone -Elevate the arm as able -Oxycodone and Tylenol for pain   Transaminitis This appears chronic.  She has cirrhosis listed in her chart, although no evidence of synthetic liver disease.  Likely Karlene Lineman.  Metastatic breast cancer Metastatic to bone, ER positive.  Well controlled with anastrozole. -Continue anastrozole  History of stroke Patient reports having a stroke many years ago.   OSA Not on CPAP            DVT prophylaxis: Lovenox  Code Status: DNR  Family Communication:   Disposition Plan: Anticipate IV antibiotics and monitor clinical response. Consults called:  Admission status: INPATIENT   At the time of admission, it appears that the appropriate admission status for this patient is INPATIENT. This is judged to be reasonable and necessary in order to provide the required intensity of service to ensure the patient's safety given: -presenting symptoms of tachycardia, elevated lactic acid -physical exam findings of tachycardia and fever, and  -initial radiographic and laboratory data elevated lactic acid and transaminitis -in the context of their chronic comorbidities metastatic breast cancer and advanced ag    Together, these circumstances are felt to place her at high risk for further clinical deterioration threatening life, limb, or organ requiring a high intensity of service due to this acute illness that poses a threat to life, limb or bodily function.  I certify that at the point of admission it is my clinical judgment that the patient will require inpatient hospital care spanning beyond 2 midnights from the point of admission and that early discharge would result in unnecessary risk of decompensation and readmission or threat to life, limb or bodily function.   Medical decision making: Patient seen at 3:52 PM on 11/10/2019.  The patient was discussed with Carmon Sails, PA-C.  What exists of the patient's chart was reviewed in  depth and summarized above.  Clinical condition: improving hemodynamically, mentation good.        [facility] Please page though [place] or Epic secure chat:  For password, contact charge nurse

## 2019-11-10 NOTE — ED Notes (Signed)
Date and time results received: 11/10/19 1:46 PM  (use smartphrase ".now" to insert current time)  Test: Lactic Critical Value: 2.3  Name of Provider Notified: Rosemarie Ax PA  Orders Received? Or Actions Taken?: Orders Received - See Orders for details

## 2019-11-10 NOTE — ED Notes (Signed)
Vascular at bedside

## 2019-11-10 NOTE — ED Triage Notes (Addendum)
Pt BIB EMS from home. Pt reports intermittent right arm pain that started last night. Pt has had progressive arm swelling and redness. Pts arm is currently swollen and painful. Pt also reports urinary incontinence that started last night.  Hx of stage 4 cancer.  BP 148/106 HR 108 SpO2 94% RA RR 34

## 2019-11-10 NOTE — ED Provider Notes (Addendum)
[facility] DEPT Provider Note   CSN: [id number] Arrival date & time: 11/10/19  1125     History Chief Complaint  Patient presents with  . Arm Swelling  . Urinary Incontinence    Jasmine Hood is a 74 y.o. female with past medical history of metastatic breast cancer on anastrozole s/p bilateral total mastectomy with adjuvant radiation and denosumab/Xgeva infusion last January 2021 presents to the ER for evaluation of right arm redness associated with warmth and pain for the last 2 days.  This is worse with movement, palpation.  Patient states she has had intermittent redness and pain in her right arm for months that comes and goes.  States she can "control" it if she takes Advil, aspirin and puts Jergen's lotion on it.  States this time it has not been improving.  Her oncologist has prescribed her a pill for this but states she does not have a prescription for it anymore. Also reports having difficulty controlling her bladder and feeling like she cannot get to the bathroom in time to urinate but also having some "tingling" when she urinates.  Has mild lower abdominal soreness.  States 2 weeks ago she had a fever of 101 but she took Advil and it went away.  She did not have arm redness at that time. Also notes mild chronic intermittent cough that she states had in the past, non productive.  Also reports bilateral left and right chest pain described as a "needle" she has had since her bilateral mastectomies.  Chest pain is worse on the right, sometimes worse with moving, palpation.  Usually these come and go, last only a few seconds and go away if she sits down. No current chest pain.  No other associated symptoms.    HPI     Past Medical History:  Diagnosis Date  . Breast cancer ([place]) 10/07/2016   right breast, spine  . Brown recluse spider bite    right leg  . COLONIC POLYPS, HX OF 01/05/2009   Qualifier: Diagnosis of  By: [name]  MD, [name]   .  Complication of anesthesia    slow to awaken after wisdom  teeth extraction age 61  . Constipation   . Genetic testing 11/27/2016   Ms. Hendry underwent genetic counseling and testing for hereditary cancer syndromes on 11/19/2016. Her results were negative for mutations in all 46 genes analyzed by [facility]'s 46-gene Common Hereditary Cancers Panel. Genes analyzed include: APC, ATM, AXIN2, BARD1, BMPR1A, BRCA1, BRCA2, BRIP1, CDH1, CDKN2A, CHEK2, CTNNA1, DICER1, EPCAM, GREM1, HOXB13, KIT, MEN1, MLH1, MSH2, MSH3, MSH6, MUTYH, NBN,  . Morbid obesity ([place]) 01/05/2009   Qualifier: Diagnosis of  By: [name]  MD, [name]   . Pneumonia 1987  . Sleep apnea    no cpap yet, has not recieved results from home test yet  . Stroke [facility])    found on CT scan, no deficits    Patient Active Problem List   Diagnosis Date Noted  . Sepsis ([place]) 11/10/2019  . Changing skin lesion 11/07/2018  . History of right breast cancer 06/05/2018  . Acquired absence of breast and nipple, bilateral 06/05/2018  . Cellulitis 06/09/2017  . Acute lower UTI 02/20/2017  . Elevated lactic acid level   . Urinary tract infection without hematuria   . Weakness   . Generalized weakness 02/19/2017  . AKI (acute kidney injury) ([place]) 02/12/2017  . Dehydration 02/12/2017  . Abnormal TSH 02/12/2017  . Genetic testing 11/27/2016  .  Port catheter in place 11/14/2016  . Bone metastases ([place]) 11/11/2016  . Morbid obesity with BMI of 50.0-59.9, adult ([place]) 11/05/2016  . Cirrhosis of liver ([place]) 11/05/2016  . Malignant neoplasm of overlapping sites of right breast in female, estrogen receptor positive ([place]) 10/21/2016  . Encounter for follow-up surveillance of colon cancer   . Benign neoplasm of ascending colon   . Benign neoplasm of descending colon   . Benign neoplasm of transverse colon   . Benign neoplasm of cecum   . OSA (obstructive sleep apnea) 09/14/2012  . Contact dermatitis and eczema due to plant 12/31/2011  .  Hypothyroidism 02/01/2010  . Essential hypertension 01/05/2009  . History of colonic polyps 01/05/2009    Past Surgical History:  Procedure Laterality Date  . BILATERAL TOTAL MASTECTOMY WITH AXILLARY LYMPH NODE DISSECTION Bilateral 04/04/2017   Procedure: RIGHT MASTECTOMY WITH TARGETED RIGHT AXILLARY NODE DISSECTION AND LEFT PROPHYLATIC MASTECTOMY ERAS PATHWAY;  Surgeon: Alphonsa Overall, MD;  Location: [facility];  Service: General;  Laterality: Bilateral;  PEC BLOCK  . BREAST BIOPSY Right 10/07/2016   invasive mammary carcinoma  . BREAST BIOPSY Left 10/08/2016   breast fibroadenoma no malignancy  . BREAST REDUCTION SURGERY Bilateral 10/28/2018   Procedure: Excision of bilateral excess breast tissue;  Surgeon: [name], DO;  Location: [facility];  Service: Plastics;  Laterality: Bilateral;  2 hours, please.  Marland Kitchen CATARACT EXTRACTION Bilateral   . COLONOSCOPY W/ BIOPSIES AND POLYPECTOMY  2012  . COLONOSCOPY WITH PROPOFOL N/A 12/22/2015   Procedure: COLONOSCOPY WITH PROPOFOL;  Surgeon: [name], MD;  Location: [facility];  Service: Endoscopy;  Laterality: N/A;  . INCISION AND DRAINAGE Right 1998   surgery for spider bite on RLE; "[name]"  . MASTECTOMY Left 04/04/2017   [name]  . MASTECTOMY WITH AXILLARY LYMPH NODE DISSECTION Right 04/04/2017  . MOLE REMOVAL  10/29/2016   Procedure: MOLE REMOVAL LEFT CHEST;  Surgeon: Alphonsa Overall, MD;  Location: [facility];  Service: General;;  . PORT-A-CATH REMOVAL Left 08/05/2018   Procedure: REMOVAL PORT-A-CATH;  Surgeon: [name], DO;  Location: [facility];  Service: Plastics;  Laterality: Left;  . PORTACATH PLACEMENT N/A 10/29/2016   Procedure: INSERTION PORT-A-CATH;  Surgeon: Alphonsa Overall, MD;  Location: [facility];  Service: General;  Laterality: N/A;  . TUBAL LIGATION    . WISDOM TOOTH EXTRACTION  age 66     OB History   No obstetric history on file.     Family History  Problem  Relation Age of Onset  . Breast cancer Mother 67       d.52 from metastatic disease  . Throat cancer Father        d.65 history of smoking  . Rheum arthritis Sister        3 sisters pos. for osteo and RA  . Diabetes Maternal Aunt   . Cancer Maternal Grandfather        mouth cancer-chewed tobacco  . Breast cancer Cousin 65  . Breast cancer Daughter 96  . Stomach cancer Sister 79    Social History   Tobacco Use  . Smoking status: Never Smoker  . Smokeless tobacco: Never Used  Vaping Use  . Vaping Use: Never used  Substance Use Topics  . Alcohol use: No  . Drug use: No    Home Medications Prior to Admission medications   Medication Sig Start Date End Date Taking? Authorizing Provider  anastrozole (ARIMIDEX) 1 MG  tablet Take 1 tablet (1 mg total) by mouth daily. 06/14/19  Yes Magrinat, Virgie Dad, MD  calcium carbonate (TUMS) 500 MG chewable tablet Chew 1 tablet (200 mg of elemental calcium total) by mouth 2 (two) times daily. Patient not taking: Reported on 11/10/2019 05/21/18   Magrinat, Virgie Dad, MD  doxycycline (VIBRA-TABS) 100 MG tablet Take 1 tablet (100 mg total) by mouth 2 (two) times daily. Patient not taking: Reported on 11/10/2019 06/14/19   Magrinat, Virgie Dad, MD    Allergies    Bee venom and Latex  Review of Systems   Review of Systems  Constitutional: Positive for fever.  Respiratory: Positive for cough (chronic).   Cardiovascular: Positive for chest pain (chronic).  Gastrointestinal: Positive for abdominal pain.  Genitourinary: Positive for difficulty urinating.  Skin: Positive for color change.  Allergic/Immunologic: Positive for immunocompromised state.  All other systems reviewed and are negative.   Physical Exam Updated Vital Signs BP (!) 147/88   Pulse 84   Temp (!) 101.7 F (38.7 C) (Rectal)   Resp (!) 27   SpO2 95%   Physical Exam Vitals and nursing note reviewed.  Constitutional:      Appearance: She is well-developed.     Comments: Non  toxic in NAD. Feels warm to touch. Oral temp 99.5. Rectal temp 101.7.   HENT:     Head: Normocephalic and atraumatic.     Nose: Nose normal.  Eyes:     Conjunctiva/sclera: Conjunctivae normal.  Cardiovascular:     Rate and Rhythm: Normal rate and regular rhythm.     Pulses:          Radial pulses are 1+ on the right side and 1+ on the left side.     Comments: Difficult exam due to body habitus. Subtle asymmetric edema RUE compared to LUE.  Diffuse tenderness, erythema as below Pulmonary:     Effort: Pulmonary effort is normal.     Breath sounds: Normal breath sounds.  Abdominal:     General: Bowel sounds are normal.     Palpations: Abdomen is soft.     Tenderness: There is abdominal tenderness (diffuse lower abdomen, worse suprapubic/RLQ).     Comments: No G/R/R. No suprapubic or CVA tenderness. Negative Murphy's and McBurney's. Active BS to lower quadrants.   Musculoskeletal:        General: Normal range of motion.     Cervical back: Normal range of motion.  Skin:    General: Skin is warm and dry.     Capillary Refill: Capillary refill takes less than 2 seconds.     Findings: Erythema present.     Comments: Diffuse, circumferential erythema from the wrist to the shoulder, right upper chest wall and axilla.  Scar tissue noted from bilateral mastectomies.  Some induration in the inferior posterior axilla on the right, patient states this "knot" has been there since his surgery.  Neurological:     Mental Status: She is alert.     Comments: Sensation and strength in RUE grossly intact  Psychiatric:        Behavior: Behavior normal.     ED Results / Procedures / Treatments   Labs (all labs ordered are listed, but only abnormal results are displayed) Labs Reviewed  LACTIC ACID, PLASMA - Abnormal; Notable for the following components:      Result Value   Lactic Acid, Venous 2.3 (*)    All other components within normal limits  COMPREHENSIVE METABOLIC PANEL - Abnormal; Notable for  the following components:   Glucose, Bld 168 (*)    AST 98 (*)    ALT 59 (*)    Total Bilirubin 1.5 (*)    All other components within normal limits  CBC WITH DIFFERENTIAL/PLATELET - Abnormal; Notable for the following components:   MCV 105.8 (*)    MCH 34.9 (*)    All other components within normal limits  CULTURE, BLOOD (ROUTINE X 2)  CULTURE, BLOOD (ROUTINE X 2)  URINE CULTURE  SARS CORONAVIRUS 2 BY RT PCR (HOSPITAL ORDER, [facility])  LACTIC ACID, PLASMA  APTT  PROTIME-INR  URINALYSIS, ROUTINE W REFLEX MICROSCOPIC    EKG EKG Interpretation  Date/Time:  Wednesday November 10 2019 11:44:01 EDT Ventricular Rate:  102 PR Interval:    QRS Duration: 96 QT Interval:  366 QTC Calculation: 477 R Axis:   -28 Text Interpretation: Sinus tachycardia Borderline left axis deviation No significant change since last tracing Confirmed by Gareth Morgan [phone]) on 11/10/2019 2:32:06 PM   Radiology DG Chest Portable 1 View  Result Date: 11/10/2019 CLINICAL DATA:  Right arm swelling with erythema extending across the arm and chest. Chest pain and fever. History of stage IV breast cancer. EXAM: PORTABLE CHEST 1 VIEW COMPARISON:  Radiographs 05/21/2018 and 02/19/2017.  CT 06/08/2018. FINDINGS: 1351 hours. The heart size and mediastinal contours are stable. The heart size is at the upper limits of normal for portable technique. Grossly stable volume loss and radiation changes superiorly in the right hemithorax. No airspace disease, pleural effusion or pneumothorax. The bones appear unremarkable. Telemetry leads overlie the chest. IMPRESSION: No acute chest findings or explanation for the patient's symptoms. Grossly stable radiation changes in the right hemithorax. Electronically Signed   By: Richardean Sale M.D.   On: 11/10/2019 14:33   UE VENOUS DUPLEX ([facility] 7 am - 7 pm)  Result Date: 11/10/2019 UPPER VENOUS STUDY  Indications: Pain, and Swelling Risk Factors: Cancer.  Limitations: Body habitus, poor ultrasound/tissue interface and patient positioning. Comparison Study: No prior studies. Performing Technologist: Oliver Hum RVT  Examination Guidelines: A complete evaluation includes B-mode imaging, spectral Doppler, color Doppler, and power Doppler as needed of all accessible portions of each vessel. Bilateral testing is considered an integral part of a complete examination. Limited examinations for reoccurring indications may be performed as noted.  Right Findings: +----------+------------+---------+-----------+----------+-------+ RIGHT     CompressiblePhasicitySpontaneousPropertiesSummary +----------+------------+---------+-----------+----------+-------+ IJV           Full       Yes       Yes                      +----------+------------+---------+-----------+----------+-------+ Subclavian    Full       Yes       Yes                      +----------+------------+---------+-----------+----------+-------+ Axillary      Full       Yes       Yes                      +----------+------------+---------+-----------+----------+-------+ Brachial      Full       Yes       Yes                      +----------+------------+---------+-----------+----------+-------+ Radial        Full                                          +----------+------------+---------+-----------+----------+-------+  Ulnar         Full                                          +----------+------------+---------+-----------+----------+-------+ Cephalic      Full                                          +----------+------------+---------+-----------+----------+-------+ Basilic       Full                                          +----------+------------+---------+-----------+----------+-------+  Left Findings: +----------+------------+---------+-----------+----------+-------+ LEFT      CompressiblePhasicitySpontaneousPropertiesSummary  +----------+------------+---------+-----------+----------+-------+ Subclavian    Full       Yes       Yes                      +----------+------------+---------+-----------+----------+-------+  Summary:  Right: No evidence of deep vein thrombosis in the upper extremity. No evidence of superficial vein thrombosis in the upper extremity.  Left: No evidence of thrombosis in the subclavian.  *See table(s) above for measurements and observations.    Preliminary     Procedures .Critical Care Performed by: Kinnie Feil, PA-C Authorized by: Kinnie Feil, PA-C   Critical care provider statement:    Critical care time (minutes):  45   Critical care was necessary to treat or prevent imminent or life-threatening deterioration of the following conditions: SIRS sepsis.   Critical care was time spent personally by me on the following activities:  Discussions with consultants, evaluation of patient's response to treatment, examination of patient, ordering and performing treatments and interventions, ordering and review of laboratory studies, ordering and review of radiographic studies, pulse oximetry, re-evaluation of patient's condition, obtaining history from patient or surrogate and review of old charts   (including critical care time)  Medications Ordered in ED Medications  cefTRIAXone (ROCEPHIN) 2 g in sodium chloride 0.9 % 100 mL IVPB (0 g Intravenous Stopped 11/10/19 1324)  sodium chloride 0.9 % bolus 500 mL (500 mLs Intravenous New Bag/Given 11/10/19 1234)  acetaminophen (TYLENOL) tablet 650 mg (650 mg Oral Given 11/10/19 1358)  fentaNYL (SUBLIMAZE) injection 50 mcg (50 mcg Intravenous Given 11/10/19 1359)    ED Course  I have reviewed the triage vital signs and the nursing notes.  Pertinent labs & imaging results that were available during my care of the patient were reviewed by me and considered in my medical decision making (see chart for details).  Clinical Course as of Nov 10 1514  Wed Nov 10, 2019  1202 Temp(!): 101.7 F (38.7 C) [CG]  1202 Pulse Rate(!): 104 [CG]  1202 Resp(!): 24 [CG]  1402 WBC: 9.1 [CG]  1403 Lactic Acid, Venous(!!): 2.3 [CG]  1420 Vascular tech notified me study negative for DVT   [CG]    Clinical Course User Index [CG] Kinnie Feil, PA-C   MDM Rules/Calculators/A&P                           This patient complains of right arm redness, swelling, warmth for 2 days.  Febrile 101.7 on arrival  here.  Reports having intermittent arm redness for months.  Fully vaccinated for COVID.  I obtained additional history from previous medical records available.  Triage and nursing notes reviewed to obtain more history and assist with MDM.  In summary, patient now on anastrozole.  Last infusion chemo 05/2019.  Oncology notes 05/2019 documented patient report of arm redness - oncologist wrote compression sleeve for arm and doxycycline to use if redness returns.     Chief complain involves an extensive number of treatment options and is a complaint that carries with it a high risk of complications and morbidity and mortality  Highest on my differential diagnosis is cellulitis.  She is at risk for DVT given cancer history.  No neuro or pulse deficits distally. There is small area of firmness in axilla but no fluctuance, obvious abscess. Patient states this firmness has been there since mastectomies. She has intermittent sharp needle pain in her right chest ever since mastectomy. Reports chronic intermittent cough, unchanged.  No SOB. Will obtain CXR.   Patient meets SIRS criteria given fever, tachycardia.  Given right upper extremity redness suspect source of fever is cellulitis.  Sepsis order set utilized.  No hypotension.    Pending labs, lactic acid, etc. I ordered medication including rocephin, IVF 500 ml, tylenol, fentanyl.   I ordered laboratory studies including screening labs, lactic acid and blood cultures.    I ordered imaging studies  including RUE DVT ultrasound, chest x-ray.   1458: I ordered, reviewed and personally visualized and interpreted the above labs and/or imaging studies.    ER work up reveals mild lactic acidosis 2.3, but normal lactic acid. Normal creatinine. Blood cultures sent.  RUE [place] negative for DVT.  CXR non acute.    Patient re-evaluated and no clinical decline, remains tachypnic. No hypotension.  No evidence of EOD.  Lactic acid improving after 500 cc IVF.   Discussed with Dr Loleta Books who will admit patient. Pending UA.   Critical care interventions: sepsis order set, IV antibiotics, IVF, admission  Final Clinical Impression(s) / ED Diagnoses Final diagnoses:  SIRS (systemic inflammatory response syndrome) [facility])    Rx / DC Orders ED Discharge Orders    None       Kinnie Feil, PA-C 11/10/19 1516    Kinnie Feil, PA-C 11/10/19 1517    Gareth Morgan, MD 11/10/19 2042

## 2019-11-10 NOTE — Progress Notes (Signed)
Right upper extremity venous duplex has been completed. Preliminary results can be found in CV Proc through chart review.  Results were given to Jasmine Sails PA.  11/10/19 2:25 PM Jasmine Hood RVT

## 2019-11-10 NOTE — ED Notes (Signed)
ED TO INPATIENT HANDOFF REPORT  Name/Age/Gender Jasmine Hood 74 y.o. female  Code Status Code Status History    Date Active Date Inactive Code Status Order ID Comments User Context   04/04/2017 1551 04/06/2017 2155 Full Code [id number]  Alphonsa Overall, MD Inpatient   02/19/2017 1102 02/21/2017 2007 Full Code [id number]  Oswald Hillock, MD ED   02/12/2017 0612 02/15/2017 0342 Full Code [id number]  Norval Morton, MD ED   Advance Care Planning Activity    Questions for Most Recent Historical Code Status (Order [id number])       Home/SNF/Other Home  Chief Complaint Sepsis [facility]) [A41.9]  Level of Care/Admitting Diagnosis ED Disposition    ED Disposition Condition [facility]: [place] [[id number]]  Level of Care: Med-Surg [16]  Covid Evaluation: Asymptomatic Screening Protocol (No Symptoms)  Admission Type: Emergency [1]  Diagnosis: Sepsis [facility]) [[id number]]  Admitting Physician: Edwin Dada [[id number]]  Attending Physician: Edwin Dada [[id number]]  Estimated length of stay: past midnight tomorrow  Certification:: I certify this patient will need inpatient services for at least 2 midnights       Medical History Past Medical History:  Diagnosis Date  . Breast cancer ([place]) 10/07/2016   right breast, spine  . Brown recluse spider bite    right leg  . COLONIC POLYPS, HX OF 01/05/2009   Qualifier: Diagnosis of  By: [name]  MD, [name]   . Complication of anesthesia    slow to awaken after wisdom  teeth extraction age 70  . Constipation   . Genetic testing 11/27/2016   Ms. Szczerba underwent genetic counseling and testing for hereditary cancer syndromes on 11/19/2016. Her results were negative for mutations in all 46 genes analyzed by [facility]'s 46-gene Common Hereditary Cancers Panel. Genes analyzed include: APC, ATM, AXIN2, BARD1, BMPR1A, BRCA1, BRCA2, BRIP1, CDH1, CDKN2A, CHEK2, CTNNA1, DICER1, EPCAM, GREM1, HOXB13, KIT,  MEN1, MLH1, MSH2, MSH3, MSH6, MUTYH, NBN,  . Hypothyroidism   . Morbid obesity ([place]) 01/05/2009   Qualifier: Diagnosis of  By: [name]  MD, [name]   . Pneumonia 1987  . Sleep apnea    no cpap yet, has not recieved results from home test yet  . Stroke [facility])    found on CT scan, no deficits    Allergies Allergies  Allergen Reactions  . Bee Venom Anaphylaxis  . Latex Swelling    IV Location/Drains/Wounds Patient Lines/Drains/Airways Status    Active Line/Drains/Airways    Name Placement date Placement time Site Days   Implanted Port 10/29/16 Right Chest 10/29/16  --  Chest  1107   Peripheral IV 11/10/19 Left Antecubital 11/10/19  1232  Antecubital  less than 1   Closed System Drain 1 Right;Lateral Breast Bulb (JP) 19 Fr. 04/04/17  1236  Breast  950   Closed System Drain 3 Left Breast Bulb (JP) 19 Fr. 04/04/17  --  Breast  950   Closed System Drain 4 Left Breast 19 Fr. 04/04/17  --  Breast  950   Closed System Drain 2 Right Breast Bulb (JP) 19 Fr. 04/04/17  1331  Breast  950   Incision (Closed) 10/29/16 Other (Comment) 10/29/16  1138   1107   Incision (Closed) 04/04/17 Breast 04/04/17  1240   950   Incision (Closed) 08/05/18 Chest Left 08/05/18  1221   462   Incision (Closed) 10/28/18 Breast Left 10/28/18  0924   378   Incision (Closed) 10/28/18 Breast Right 10/28/18  3009   378   Wound / Incision (Open or Dehisced) 02/19/17 Other (Comment) Hip Left;Lower skin tear/abrasion r/t fall  02/19/17  1130  Hip  994          Labs/Imaging Results for orders placed or performed during the hospital encounter of 11/10/19 (from the past 48 hour(s))  Comprehensive metabolic panel     Status: Abnormal   Collection Time: 11/10/19 12:14 PM  Result Value Ref Range   Sodium 136 135 - 145 mmol/L   Potassium 4.0 3.5 - 5.1 mmol/L   Chloride 100 98 - 111 mmol/L   CO2 24 22 - 32 mmol/L   Glucose, Bld 168 (H) 70 - 99 mg/dL    Comment: Glucose reference range applies only to samples taken  after fasting for at least 8 hours.   BUN 8 8 - 23 mg/dL   Creatinine, Ser 0.57 0.44 - 1.00 mg/dL   Calcium 9.2 8.9 - 10.3 mg/dL   Total Protein 7.8 6.5 - 8.1 g/dL   Albumin 3.7 3.5 - 5.0 g/dL   AST 98 (H) 15 - 41 U/L   ALT 59 (H) 0 - 44 U/L   Alkaline Phosphatase 122 38 - 126 U/L   Total Bilirubin 1.5 (H) 0.3 - 1.2 mg/dL   GFR calc non Af Amer >60 >60 mL/min   GFR calc Af Amer >60 >60 mL/min   Anion gap 12 5 - 15    Comment: Performed at [facility], [place] [address] [place], [place] [address]  CBC WITH DIFFERENTIAL     Status: Abnormal   Collection Time: 11/10/19 12:14 PM  Result Value Ref Range   WBC 9.1 4.0 - 10.5 K/uL   RBC 3.98 3.87 - 5.11 MIL/uL   Hemoglobin 13.9 12.0 - 15.0 g/dL   HCT 42.1 36 - 46 %   MCV 105.8 (H) 80.0 - 100.0 fL   MCH 34.9 (H) 26.0 - 34.0 pg   MCHC 33.0 30.0 - 36.0 g/dL   RDW 13.5 11.5 - 15.5 %   Platelets 155 150 - 400 K/uL   nRBC 0.0 0.0 - 0.2 %   Neutrophils Relative % 85 %   Neutro Abs 7.7 1.7 - 7.7 K/uL   Lymphocytes Relative 12 %   Lymphs Abs 1.1 0.7 - 4.0 K/uL   Monocytes Relative 3 %   Monocytes Absolute 0.2 0 - 1 K/uL   Eosinophils Relative 0 %   Eosinophils Absolute 0.0 0 - 0 K/uL   Basophils Relative 0 %   Basophils Absolute 0.0 0 - 0 K/uL   Immature Granulocytes 0 %   Abs Immature Granulocytes 0.04 0.00 - 0.07 K/uL    Comment: Performed at [facility], [place] [address], [place], [place] [address]  APTT     Status: None   Collection Time: 11/10/19 12:14 PM  Result Value Ref Range   aPTT 29 24 - 36 seconds    Comment: Performed at [facility], [place] [address], [place], [place] [address]  Protime-INR     Status: None   Collection Time: 11/10/19 12:14 PM  Result Value Ref Range   Prothrombin Time 13.7 11.4 - 15.2 seconds   INR 1.1 0.8 - 1.2    Comment: (NOTE) INR goal varies based on device and disease states. Performed at [facility], [place] [address], [place], [place] [address]   Lactic acid, plasma     Status: Abnormal   Collection Time:  11/10/19  1:09 PM  Result Value Ref Range   Lactic Acid, Venous 2.3 (HH) 0.5 - 1.9 mmol/L    Comment: CRITICAL RESULT CALLED TO, READ BACK BY AND VERIFIED WITH: MCIVER,M. RN _0  11/10/19 BILLINGSLEY,L Performed at [facility], [place] [address] [place], [place] [address]   Lactic acid, plasma     Status: None   Collection Time: 11/10/19  2:10 PM  Result Value Ref Range   Lactic Acid, Venous 1.9 0.5 - 1.9 mmol/L    Comment: Performed at [facility], [place] [address] [place], [place] [address]  SARS Coronavirus 2 by RT PCR (hospital order, performed in [facility] hospital lab) Nasopharyngeal Nasopharyngeal Swab     Status: None   Collection Time: 11/10/19  3:25 PM   Specimen: Nasopharyngeal Swab  Result Value Ref Range   SARS Coronavirus 2 NEGATIVE NEGATIVE    Comment: (NOTE) SARS-CoV-2 target nucleic acids are NOT DETECTED.  The SARS-CoV-2 RNA is generally detectable in upper and lower respiratory specimens during the acute phase of infection. The lowest concentration of SARS-CoV-2 viral copies this assay can detect is 250 copies / mL. A negative result does not preclude SARS-CoV-2 infection and should not be used as the sole basis for treatment or other patient management decisions.  A negative result may occur with improper specimen collection / handling, submission of specimen other than nasopharyngeal swab, presence of viral mutation(s) within the areas targeted by this assay, and inadequate number of viral copies (<250 copies / mL). A negative result must be combined with clinical observations, patient history, and epidemiological information.  Fact Sheet for Patients:   StrictlyIdeas.no  Fact Sheet for Healthcare Providers: BankingDealers.co.za  This test is not yet approved or  cleared by the  [place] FDA and has been authorized for detection and/or diagnosis of SARS-CoV-2 by FDA under an Emergency Use Authorization (EUA).  This EUA will remain in effect (meaning this test can be used) for the duration of the COVID-19 declaration under Section 564(b)(1) of the Act, 21 U.S.C. section 360bbb-3(b)(1), unless the authorization is terminated or revoked sooner.  Performed at [facility], [place] [address] [place], [place] [address]    DG Chest Portable 1 View  Result Date: 11/10/2019 CLINICAL DATA:  Right arm swelling with erythema extending across the arm and chest. Chest pain and fever. History of stage IV breast cancer. EXAM: PORTABLE CHEST 1 VIEW COMPARISON:  Radiographs 05/21/2018 and 02/19/2017.  CT 06/08/2018. FINDINGS: 1351 hours. The heart size and mediastinal contours are stable. The heart size is at the upper limits of normal for portable technique. Grossly stable volume loss and radiation changes superiorly in the right hemithorax. No airspace disease, pleural effusion or pneumothorax. The bones appear unremarkable. Telemetry leads overlie the chest. IMPRESSION: No acute chest findings or explanation for the patient's symptoms. Grossly stable radiation changes in the right hemithorax. Electronically Signed   By: Richardean Sale M.D.   On: 11/10/2019 14:33   UE VENOUS DUPLEX ([facility] & [facility] 7 am - 7 pm)  Result Date: 11/10/2019 UPPER VENOUS STUDY  Indications: Pain, and Swelling Risk Factors: Cancer. Limitations: Body habitus, poor ultrasound/tissue interface and patient positioning. Comparison Study: No prior studies. Performing Technologist: Oliver Hum RVT  Examination Guidelines: A complete evaluation includes B-mode imaging, spectral Doppler, color Doppler, and power Doppler as needed of all accessible portions of each vessel. Bilateral testing is considered an integral part of a complete examination. Limited examinations for reoccurring  indications may be  performed as noted.  Right Findings: +----------+------------+---------+-----------+----------+-------+ RIGHT     CompressiblePhasicitySpontaneousPropertiesSummary +----------+------------+---------+-----------+----------+-------+ IJV           Full       Yes       Yes                      +----------+------------+---------+-----------+----------+-------+ Subclavian    Full       Yes       Yes                      +----------+------------+---------+-----------+----------+-------+ Axillary      Full       Yes       Yes                      +----------+------------+---------+-----------+----------+-------+ Brachial      Full       Yes       Yes                      +----------+------------+---------+-----------+----------+-------+ Radial        Full                                          +----------+------------+---------+-----------+----------+-------+ Ulnar         Full                                          +----------+------------+---------+-----------+----------+-------+ Cephalic      Full                                          +----------+------------+---------+-----------+----------+-------+ Basilic       Full                                          +----------+------------+---------+-----------+----------+-------+  Left Findings: +----------+------------+---------+-----------+----------+-------+ LEFT      CompressiblePhasicitySpontaneousPropertiesSummary +----------+------------+---------+-----------+----------+-------+ Subclavian    Full       Yes       Yes                      +----------+------------+---------+-----------+----------+-------+  Summary:  Right: No evidence of deep vein thrombosis in the upper extremity. No evidence of superficial vein thrombosis in the upper extremity.  Left: No evidence of thrombosis in the subclavian.  *See table(s) above for measurements and observations.  Diagnosing physician: Curt Jews  MD Electronically signed by Curt Jews MD on 11/10/2019 at 3:43:44 PM.    Final     Pending Labs Unresulted Labs (From admission, onward) Comment          Start     Ordered   11/10/19 1203  Blood Culture (routine x 2)  BLOOD CULTURE X 2,   STAT      11/10/19 1204   11/10/19 1203  Urinalysis, Routine w reflex microscopic  ONCE - STAT,   STAT        11/10/19 1204   11/10/19 1203  Urine culture  ONCE - STAT,  STAT        11/10/19 1204   Signed and Held  Creatinine, serum  (enoxaparin (LOVENOX)    CrCl >/= 30 ml/min)  Weekly,   R     Comments: while on enoxaparin therapy    Signed and Held   Signed and Held  CBC  Tomorrow morning,   R        Signed and Held   Signed and Held  Comprehensive metabolic panel  Tomorrow morning,   R        Signed and Held   Signed and Held  TSH  Tomorrow morning,   R        Signed and Held          Vitals/Pain Today's Vitals   11/10/19 1526 11/10/19 1550 11/10/19 1630 11/10/19 1701  BP:  140/71 139/75 (!) 145/73  Pulse:  82 82 79  Resp:  20 19 (!) 29  Temp:      TempSrc:      SpO2:  95% 93% 94%  Weight: 103 kg     Height: 4' 11.5" (1.511 m)     PainSc:        Isolation Precautions No active isolations  Medications Medications  cefTRIAXone (ROCEPHIN) 2 g in sodium chloride 0.9 % 100 mL IVPB (0 g Intravenous Stopped 11/10/19 1324)  acetaminophen (TYLENOL) tablet 650 mg (has no administration in time range)    Or  acetaminophen (TYLENOL) suppository 650 mg (has no administration in time range)  oxyCODONE (Oxy IR/ROXICODONE) immediate release tablet 5 mg (has no administration in time range)  HYDROmorphone (DILAUDID) injection 0.5-1 mg (has no administration in time range)  ondansetron (ZOFRAN) tablet 4 mg (has no administration in time range)    Or  ondansetron (ZOFRAN) injection 4 mg (has no administration in time range)  sodium chloride 0.9 % bolus 500 mL (0 mLs Intravenous Stopped 11/10/19 1525)  acetaminophen (TYLENOL) tablet 650 mg  (650 mg Oral Given 11/10/19 1358)  fentaNYL (SUBLIMAZE) injection 50 mcg (50 mcg Intravenous Given 11/10/19 1359)    Mobility walks

## 2019-11-11 DIAGNOSIS — L03113 Cellulitis of right upper limb: Secondary | ICD-10-CM

## 2019-11-11 DIAGNOSIS — A419 Sepsis, unspecified organism: Principal | ICD-10-CM

## 2019-11-11 LAB — COMPREHENSIVE METABOLIC PANEL
ALT: 50 U/L — ABNORMAL HIGH (ref 0–44)
AST: 75 U/L — ABNORMAL HIGH (ref 15–41)
Albumin: 3.1 g/dL — ABNORMAL LOW (ref 3.5–5.0)
Alkaline Phosphatase: 113 U/L (ref 38–126)
Anion gap: 11 (ref 5–15)
BUN: 9 mg/dL (ref 8–23)
CO2: 25 mmol/L (ref 22–32)
Calcium: 8.7 mg/dL — ABNORMAL LOW (ref 8.9–10.3)
Chloride: 102 mmol/L (ref 98–111)
Creatinine, Ser: 0.67 mg/dL (ref 0.44–1.00)
GFR calc Af Amer: >60 mL/min (ref >60)
GFR calc non Af Amer: >60 mL/min (ref >60)
Glucose, Bld: 158 mg/dL — ABNORMAL HIGH (ref 70–99)
Potassium: 3.4 mmol/L — ABNORMAL LOW (ref 3.5–5.1)
Sodium: 138 mmol/L (ref 135–145)
Total Bilirubin: 0.8 mg/dL (ref 0.3–1.2)
Total Protein: 6.9 g/dL (ref 6.5–8.1)

## 2019-11-11 LAB — CBC
HCT: 38.8 % (ref 36.0–46.0)
Hemoglobin: 12.9 g/dL (ref 12.0–15.0)
MCH: 34.9 pg — ABNORMAL HIGH (ref 26.0–34.0)
MCHC: 33.2 g/dL (ref 30.0–36.0)
MCV: 104.9 fL — ABNORMAL HIGH (ref 80.0–100.0)
Platelets: 142 10*3/uL — ABNORMAL LOW (ref 150–400)
RBC: 3.7 MIL/uL — ABNORMAL LOW (ref 3.87–5.11)
RDW: 13.8 % (ref 11.5–15.5)
WBC: 5.8 10*3/uL (ref 4.0–10.5)
nRBC: 0 % (ref 0.0–0.2)

## 2019-11-11 LAB — URINALYSIS, ROUTINE W REFLEX MICROSCOPIC
Bacteria, UA: NONE SEEN
Bilirubin Urine: NEGATIVE
Glucose, UA: NEGATIVE mg/dL
Hgb urine dipstick: NEGATIVE
Ketones, ur: NEGATIVE mg/dL
Nitrite: NEGATIVE
Protein, ur: NEGATIVE mg/dL
Specific Gravity, Urine: 1.015 (ref 1.005–1.030)
pH: 5 (ref 5.0–8.0)

## 2019-11-11 LAB — TSH: TSH: 8.807 u[IU]/mL — ABNORMAL HIGH (ref 0.350–4.500)

## 2019-11-11 MED ORDER — LEVOTHYROXINE SODIUM 25 MCG PO TABS
25.0000 ug | ORAL_TABLET | Freq: Every day | ORAL | Status: DC
  Administered 2019-11-12 – 2019-11-13 (×2): 25 ug via ORAL
  Filled 2019-11-11 (×2): qty 1

## 2019-11-11 NOTE — Progress Notes (Addendum)
PROGRESS NOTE    Jasmine Hood    Code Status: DNR  IRW:[id number] DOB: 10-28-1945 DOA: 11/10/2019 LOS: 1 days  PCP: [name], [name], MD CC:  Chief Complaint  Patient presents with  . Arm Swelling  . Urinary Incontinence       Hospital Summary   This is a 74 year old female with history of metastatic ER positive breast cancer/PE bilateral mastectomies stable on anastrozole, OSA not on CPAP who presented with several days of right arm redness, pain and fever as well as an episode of urinary incontinence.  She was in her usual state of health until several days ago when she noted redness in her right arm spreading to her right chest wall.  Reported a history of the same in the past.  Also reported she recently got the [employer] vaccine 2 weeks ago.  In the ER, temp 101.48F, heart rate 104, lactate 2.3.  Chest x-ray showed no opacities.  Right upper extremity duplex showed no DVT.    Lactic acid 2.3 which resolved to 1.9, WBC 9.1 which improved to 5.8  A & P   Principal Problem:   Sepsis ([place]) Active Problems:   Hypothyroidism   Essential hypertension   OSA (obstructive sleep apnea)   Malignant neoplasm of overlapping sites of right breast in female, estrogen receptor positive ([place])   Morbid obesity with BMI of 50.0-59.9, adult (HCC)   Transaminitis   Bone metastases ([place])   1. Sepsis from RUE cellulitis a. Concern for erysipelas at presentation b. Erythema on chest wall as well, CXR unremarkable and not concerning for compression or SVC syndrome c. Day 2 ceftriaxone d. Afebrile today with improved WBC but was not elevated prior e. Keep extremity elevated f. Continue oxycodone and Tylenol for pain g. RUE compression sleeve at discharge to prevent recurrence  2. Chronic transaminitis, likely [place]  3. Metastatic breast cancer, ER positive s/p bilateral mastectomies a. Continue anastrozole b. Follow-up with Dr. [name] outpatient  4. Episode of urinary  incontinence, unknown etiology -resolved a. Sterile pyuria on UA b. Apparently was able to control her urine this a.m.  c. had an unremarkable reflex exam d. If this recurs would get spinal imaging and evaluate for metastatic disease compressing cord  5. Subclinical hypothyroidism a. TSH 8.807 b. Seems to have been on and off Synthroid in the past but patient is unable to corroborate this c. will start on levothyroxine 25 mcg d. will need outpatient follow-up labs  6. OSA not on CPAP  7. Morbid obesity a. Body mass index is 45.08 kg/m.  8. History of CVA   DVT prophylaxis: Lovenox   Family Communication: No family at bedside  Disposition Plan:  Status is: Inpatient  Remains inpatient appropriate because:IV treatments appropriate due to intensity of illness or inability to take PO   Dispo: The patient is from: Home              Anticipated d/c is to: Home              Anticipated d/c date is: 2 days              Patient currently is not medically stable to d/c.          Pressure injury documentation    None  Consultants  None  Procedures  None  Antibiotics   Anti-infectives (From admission, onward)   Start     Dose/Rate Route Frequency Ordered Stop   11/10/19 1215  cefTRIAXone (ROCEPHIN) 2 g in sodium chloride 0.9 % 100 mL IVPB     Discontinue     2 g 200 mL/hr over 30 Minutes Intravenous Every 24 hours 11/10/19 1204          Subjective   Denies recurrence of urinary incontinence.  States that her right arm is still swollen but is feeling better and warmth has improved reports that she does have intermittent right upper extremity swelling and redness occasionally but this persisted and worsened prompting her to come to the ED.  No fevers.  No other issues or overnight events.  Objective   Vitals:   11/10/19 1948 11/10/19 2124 11/11/19 0553 11/11/19 1406  BP: (!) 163/87 (!) 155/59 140/64 (!) 142/72  Pulse: 81 77 73 78  Resp: 16 (!) 22 20 20    Temp: 98.7 F (37.1 C) 98.6 F (37 C) (!) 97.5 F (36.4 C) 98.2 F (36.8 C)  TempSrc: Oral Oral Oral Oral  SpO2: 98% 96% 96% 94%  Weight:      Height:        Intake/Output Summary (Last 24 hours) at 11/11/2019 1630 Last data filed at 11/11/2019 1400 Gross per 24 hour  Intake 780 ml  Output 200 ml  Net 580 ml   Filed Weights   11/10/19 1526  Weight: 103 kg    Examination:  Physical Exam Vitals and nursing note reviewed.  Constitutional:      Appearance: She is obese.  HENT:     Head: Normocephalic and atraumatic.  Eyes:     Conjunctiva/sclera: Conjunctivae normal.  Cardiovascular:     Rate and Rhythm: Normal rate and regular rhythm.  Pulmonary:     Effort: Pulmonary effort is normal.     Breath sounds: Normal breath sounds.  Abdominal:     General: Abdomen is flat.     Palpations: Abdomen is soft.  Musculoskeletal:     Comments: RUE swelling with mild warmth and erythema with erythema extending into upper chest  Skin:    Coloration: Skin is not jaundiced or pale.  Neurological:     Mental Status: She is alert. Mental status is at baseline.     Motor: No weakness.     Deep Tendon Reflexes: Reflexes normal.  Psychiatric:        Mood and Affect: Mood normal.        Behavior: Behavior normal.     Data Reviewed: I have personally reviewed following labs and imaging studies  CBC: Recent Labs  Lab 11/10/19 1214 11/11/19 0354  WBC 9.1 5.8  NEUTROABS 7.7  --   HGB 13.9 12.9  HCT 42.1 38.8  MCV 105.8* 104.9*  PLT 155 408*   Basic Metabolic Panel: Recent Labs  Lab 11/10/19 1214 11/11/19 0354  NA 136 138  K 4.0 3.4*  CL 100 102  CO2 24 25  GLUCOSE 168* 158*  BUN 8 9  CREATININE 0.57 0.67  CALCIUM 9.2 8.7*   GFR: Estimated Creatinine Clearance: 65 mL/min (by C-G formula based on SCr of 0.67 mg/dL). Liver Function Tests: Recent Labs  Lab 11/10/19 1214 11/11/19 0354  AST 98* 75*  ALT 59* 50*  ALKPHOS 122 113  BILITOT 1.5* 0.8  PROT 7.8 6.9   ALBUMIN 3.7 3.1*   No results for input(s): LIPASE, AMYLASE in the last 168 hours. No results for input(s): AMMONIA in the last 168 hours. Coagulation Profile: Recent Labs  Lab 11/10/19 1214  INR 1.1   Cardiac Enzymes: No results  for input(s): CKTOTAL, CKMB, CKMBINDEX, TROPONINI in the last 168 hours. BNP (last 3 results) No results for input(s): PROBNP in the last 8760 hours. HbA1C: No results for input(s): HGBA1C in the last 72 hours. CBG: No results for input(s): GLUCAP in the last 168 hours. Lipid Profile: No results for input(s): CHOL, HDL, LDLCALC, TRIG, CHOLHDL, LDLDIRECT in the last 72 hours. Thyroid Function Tests: Recent Labs    11/11/19 0354  TSH 8.807*   Anemia Panel: No results for input(s): VITAMINB12, FOLATE, FERRITIN, TIBC, IRON, RETICCTPCT in the last 72 hours. Sepsis Labs: Recent Labs  Lab 11/10/19 1309 11/10/19 1410  LATICACIDVEN 2.3* 1.9    Recent Results (from the past 240 hour(s))  Blood Culture (routine x 2)     Status: None (Preliminary result)   Collection Time: 11/10/19 12:03 PM   Specimen: BLOOD  Result Value Ref Range Status   Specimen Description   Final    BLOOD BLOOD LEFT FOREARM Performed at [facility], [place] [address], [place], [place] [address]    Special Requests   Final    BOTTLES DRAWN AEROBIC AND ANAEROBIC Blood Culture results may not be optimal due to an inadequate volume of blood received in culture bottles Performed at [facility] [address], [place], [place] [address]    Culture   Final    NO GROWTH < 24 HOURS Performed at [facility] [address], [place], [place] [address]    Report Status PENDING  Incomplete  Blood Culture (routine x 2)     Status: None (Preliminary result)   Collection Time: 11/10/19  1:04 PM   Specimen: BLOOD  Result Value Ref Range Status   Specimen Description   Final    BLOOD LEFT ANTECUBITAL Performed at [facility] [address], [place], [place] [address]    Special Requests   Final    BOTTLES DRAWN AEROBIC AND ANAEROBIC Blood Culture results may not be optimal due to an inadequate volume of blood received in culture bottles Performed at [facility] [address], [place], [place] [address]    Culture   Final    NO GROWTH < 24 HOURS Performed at [facility] [address], [place], [place] [address]    Report Status PENDING  Incomplete  SARS Coronavirus 2 by RT PCR (hospital order, performed in [facility] hospital lab) Nasopharyngeal Nasopharyngeal Swab     Status: None   Collection Time: 11/10/19  3:25 PM   Specimen: Nasopharyngeal Swab  Result Value Ref Range Status   SARS Coronavirus 2 NEGATIVE NEGATIVE Final    Comment: (NOTE) SARS-CoV-2 target nucleic acids are NOT DETECTED.  The SARS-CoV-2 RNA is generally detectable in upper and lower respiratory specimens during the acute phase of infection. The lowest concentration of SARS-CoV-2 viral copies this assay can detect is 250 copies / mL. A negative result does not preclude SARS-CoV-2 infection and should not be used as the sole basis for treatment or other patient management decisions.  A negative result may occur with improper specimen collection / handling, submission of specimen other than nasopharyngeal swab, presence of viral mutation(s) within the areas targeted by this assay, and inadequate number of viral copies (<250 copies / mL). A negative result must be combined with clinical observations, patient history, and epidemiological information.  Fact Sheet for Patients:   StrictlyIdeas.no  Fact Sheet for Healthcare Providers: BankingDealers.co.za  This test is not yet  approved or  cleared by the [place] and has been authorized for detection and/or diagnosis of SARS-CoV-2 by FDA under an Emergency Use Authorization (EUA).  This EUA  will remain in effect (meaning this test can be used) for the duration of the COVID-19 declaration under Section 564(b)(1) of the Act, 21 U.S.C. section 360bbb-3(b)(1), unless the authorization is terminated or revoked sooner.  Performed at [facility], [place] [address] [place], [place] [address]          Radiology Studies: DG Chest Portable 1 View  Result Date: 11/10/2019 CLINICAL DATA:  Right arm swelling with erythema extending across the arm and chest. Chest pain and fever. History of stage IV breast cancer. EXAM: PORTABLE CHEST 1 VIEW COMPARISON:  Radiographs 05/21/2018 and 02/19/2017.  CT 06/08/2018. FINDINGS: 1351 hours. The heart size and mediastinal contours are stable. The heart size is at the upper limits of normal for portable technique. Grossly stable volume loss and radiation changes superiorly in the right hemithorax. No airspace disease, pleural effusion or pneumothorax. The bones appear unremarkable. Telemetry leads overlie the chest. IMPRESSION: No acute chest findings or explanation for the patient's symptoms. Grossly stable radiation changes in the right hemithorax. Electronically Signed   By: Richardean Sale M.D.   On: 11/10/2019 14:33   UE VENOUS DUPLEX ([facility] & [facility] 7 am - 7 pm)  Result Date: 11/10/2019 UPPER VENOUS STUDY  Indications: Pain, and Swelling Risk Factors: Cancer. Limitations: Body habitus, poor ultrasound/tissue interface and patient positioning. Comparison Study: No prior studies. Performing Technologist: Oliver Hum RVT  Examination Guidelines: A complete evaluation includes B-mode imaging, spectral Doppler, color Doppler, and power Doppler as needed of all accessible portions of each vessel. Bilateral testing is considered an integral part of a complete examination. Limited examinations for reoccurring indications may be performed as noted.  Right Findings: +----------+------------+---------+-----------+----------+-------+ RIGHT      CompressiblePhasicitySpontaneousPropertiesSummary +----------+------------+---------+-----------+----------+-------+ IJV           Full       Yes       Yes                      +----------+------------+---------+-----------+----------+-------+ Subclavian    Full       Yes       Yes                      +----------+------------+---------+-----------+----------+-------+ Axillary      Full       Yes       Yes                      +----------+------------+---------+-----------+----------+-------+ Brachial      Full       Yes       Yes                      +----------+------------+---------+-----------+----------+-------+ Radial        Full                                          +----------+------------+---------+-----------+----------+-------+ Ulnar         Full                                          +----------+------------+---------+-----------+----------+-------+  Cephalic      Full                                          +----------+------------+---------+-----------+----------+-------+ Basilic       Full                                          +----------+------------+---------+-----------+----------+-------+  Left Findings: +----------+------------+---------+-----------+----------+-------+ LEFT      CompressiblePhasicitySpontaneousPropertiesSummary +----------+------------+---------+-----------+----------+-------+ Subclavian    Full       Yes       Yes                      +----------+------------+---------+-----------+----------+-------+  Summary:  Right: No evidence of deep vein thrombosis in the upper extremity. No evidence of superficial vein thrombosis in the upper extremity.  Left: No evidence of thrombosis in the subclavian.  *See table(s) above for measurements and observations.  Diagnosing physician: Curt Jews MD Electronically signed by Curt Jews MD on 11/10/2019 at 3:43:44 PM.    Final         Scheduled Meds: .  anastrozole  1 mg Oral Daily  . Chlorhexidine Gluconate Cloth  6 each Topical Daily  . enoxaparin (LOVENOX) injection  50 mg Subcutaneous Q24H   Continuous Infusions: . cefTRIAXone (ROCEPHIN)  IV 2 g (11/11/19 1251)     Time spent: 27 minutes with over 50% of the time coordinating the patient's care    Harold Hedge, DO [facility] Pager [phone]  Call night coverage person covering after 7pm

## 2019-11-12 LAB — CBC
HCT: 38 % (ref 36.0–46.0)
Hemoglobin: 12.2 g/dL (ref 12.0–15.0)
MCH: 34.6 pg — ABNORMAL HIGH (ref 26.0–34.0)
MCHC: 32.1 g/dL (ref 30.0–36.0)
MCV: 107.6 fL — ABNORMAL HIGH (ref 80.0–100.0)
Platelets: 137 10*3/uL — ABNORMAL LOW (ref 150–400)
RBC: 3.53 MIL/uL — ABNORMAL LOW (ref 3.87–5.11)
RDW: 13.8 % (ref 11.5–15.5)
WBC: 5.2 10*3/uL (ref 4.0–10.5)
nRBC: 0 % (ref 0.0–0.2)

## 2019-11-12 LAB — BASIC METABOLIC PANEL
Anion gap: 7 (ref 5–15)
BUN: 10 mg/dL (ref 8–23)
CO2: 26 mmol/L (ref 22–32)
Calcium: 8.4 mg/dL — ABNORMAL LOW (ref 8.9–10.3)
Chloride: 103 mmol/L (ref 98–111)
Creatinine, Ser: 0.61 mg/dL (ref 0.44–1.00)
GFR calc Af Amer: >60 mL/min (ref >60)
GFR calc non Af Amer: >60 mL/min (ref >60)
Glucose, Bld: 155 mg/dL — ABNORMAL HIGH (ref 70–99)
Potassium: 3.6 mmol/L (ref 3.5–5.1)
Sodium: 136 mmol/L (ref 135–145)

## 2019-11-12 LAB — D-DIMER, QUANTITATIVE: D-Dimer, Quant: 0.79 ug/mL-FEU — ABNORMAL HIGH (ref 0.00–0.50)

## 2019-11-12 LAB — URINE CULTURE: Culture: NO GROWTH

## 2019-11-12 NOTE — Progress Notes (Addendum)
PROGRESS NOTE    Jasmine Hood    Code Status: DNR  JXB:[id number] DOB: 02/20/1946 DOA: 11/10/2019 LOS: 2 days  PCP: [name], [name], MD CC:  Chief Complaint  Patient presents with   Arm Swelling   Urinary Incontinence       Hospital Summary   This is a 74 year old female with history of metastatic ER positive breast cancer/PE bilateral mastectomies stable on anastrozole, OSA not on CPAP who presented with several days of right arm redness, pain and fever as well as an episode of urinary incontinence.  She was in her usual state of health until several days ago when she noted redness in her right arm spreading to her right chest wall.  Reported a history of the same in the past.  Also reported she recently got the [employer] vaccine 2 weeks ago.   In the ER, temp 101.87F, heart rate 104, lactate 2.3.  Chest x-ray showed no opacities.  Right upper extremity duplex showed no DVT.    Lactic acid 2.3 which resolved to 1.9, WBC 9.1 which improved to 5.8  A & P   Principal Problem:   Sepsis ([place]) Active Problems:   Hypothyroidism   Essential hypertension   OSA (obstructive sleep apnea)   Malignant neoplasm of overlapping sites of right breast in female, estrogen receptor positive ([place])   Morbid obesity with BMI of 50.0-59.9, adult (HCC)   Transaminitis   Bone metastases (HCC)   Sepsis from RUE cellulitis, sepsis resolved Septic on admission with fever, tachycardia, lactic acid and evidence of infection in right upper extremity Concern for erysipelas at presentation, however cellulitis more likely related to poor lymphatic flow in right upper extremity after history of lymphadenectomy/mastectomy -occasionally has erythema and pain of RUE for which Dr. [name] had written for a compression sleeve and as needed doxycycline in the past Erythema on chest wall as well CXR unremarkable and not concerning for compression or SVC syndrome Erythema has improved but arm  continues to remain significantly swollen with some tenderness to palpation on medial bicep RUE ultrasound was negative for DVT however due to her persistent symptoms a D-dimer was ordered to see if this would be significantly elevated and if repeat ultrasound was necessary.  D-dimer was minimally elevated (0.79) we will hold off on further imaging at this time Day 3-TBD ceftriaxone Afebrile today  Continue keep extremity elevated Continue oxycodone and Tylenol for pain RUE compression sleeve at discharge to prevent recurrence PT eval  Chronic transaminitis, likely [place]  Metastatic breast cancer, ER positive s/p bilateral mastectomies Continue anastrozole Follow-up with Dr. [name] outpatient  Episode of urinary incontinence, unknown etiology -resolved Sterile pyuria on UA Apparently was able to control her urine this a.m.  had an unremarkable reflex exam If this recurs would get spinal imaging and evaluate for metastatic disease compressing cord  Subclinical hypothyroidism TSH 8.807 Seems to have been on and off Synthroid in the past but patient is unable to corroborate this will start on levothyroxine 25 mcg will need outpatient follow-up labs  OSA not on CPAP  Morbid obesity Body mass index is 45.08 kg/m.  History of CVA   DVT prophylaxis: Lovenox   Family Communication: No family at bedside  Disposition Plan: Hopefully discharge in the next 24 to 48 hours if she has improved swelling and erythema and can discharge on oral antibiotics at that time Status is: Inpatient  Remains inpatient appropriate because:IV treatments appropriate due to intensity of illness or  inability to take PO   Dispo: The patient is from: Home              Anticipated d/c is to: Home              Anticipated d/c date is: 2 days              Patient currently is not medically stable to d/c.          Pressure injury documentation    None  Consultants  None  Procedures   None  Antibiotics   Anti-infectives (From admission, onward)    Start     Dose/Rate Route Frequency Ordered Stop   11/10/19 1215  cefTRIAXone (ROCEPHIN) 2 g in sodium chloride 0.9 % 100 mL IVPB     Discontinue     2 g 200 mL/hr over 30 Minutes Intravenous Every 24 hours 11/10/19 1204           Subjective   Complaining of persistent if not increased swelling of her right arm but decreased erythema.  Denies any fevers, no recurrence of urinary incontinence, no overnight events or other issues.  Objective   Vitals:   11/11/19 2124 11/11/19 2125 11/12/19 0625 11/12/19 1317  BP: (!) 153/67 (!) 153/67 (!) 148/70 (!) 153/67  Pulse: 81 81 79 80  Resp: 18 18 18 18   Temp: 98.4 F (36.9 C) 98.4 F (36.9 C) 98 F (36.7 C) 97.8 F (36.6 C)  TempSrc: Oral Oral Oral   SpO2:  98% 95% 97%  Weight:      Height:        Intake/Output Summary (Last 24 hours) at 11/12/2019 1420 Last data filed at 11/12/2019 1100 Gross per 24 hour  Intake 370 ml  Output 950 ml  Net -580 ml   Filed Weights   11/10/19 1526  Weight: 103 kg    Examination:  Physical Exam Vitals and nursing note reviewed.  Constitutional:      Appearance: Normal appearance.  HENT:     Head: Normocephalic and atraumatic.  Eyes:     Conjunctiva/sclera: Conjunctivae normal.  Cardiovascular:     Rate and Rhythm: Normal rate and regular rhythm.  Pulmonary:     Effort: Pulmonary effort is normal.     Breath sounds: Normal breath sounds.  Abdominal:     General: Abdomen is flat.     Palpations: Abdomen is soft.  Musculoskeletal:        General: Swelling present.     Comments: RUE swollen with improved erythema, tenderness to palpation over medial bicep  Skin:    Coloration: Skin is not jaundiced or pale.  Neurological:     Mental Status: She is alert. Mental status is at baseline.  Psychiatric:        Mood and Affect: Mood normal.        Behavior: Behavior normal.     Data Reviewed: I have personally  reviewed following labs and imaging studies  CBC: Recent Labs  Lab 11/10/19 1214 11/11/19 0354 11/12/19 0409  WBC 9.1 5.8 5.2  NEUTROABS 7.7  --   --   HGB 13.9 12.9 12.2  HCT 42.1 38.8 38.0  MCV 105.8* 104.9* 107.6*  PLT 155 142* 448*   Basic Metabolic Panel: Recent Labs  Lab 11/10/19 1214 11/11/19 0354 11/12/19 0409  NA 136 138 136  K 4.0 3.4* 3.6  CL 100 102 103  CO2 24 25 26   GLUCOSE 168* 158* 155*  BUN 8 9  10  CREATININE 0.57 0.67 0.61  CALCIUM 9.2 8.7* 8.4*   GFR: Estimated Creatinine Clearance: 65 mL/min (by C-G formula based on SCr of 0.61 mg/dL). Liver Function Tests: Recent Labs  Lab 11/10/19 1214 11/11/19 0354  AST 98* 75*  ALT 59* 50*  ALKPHOS 122 113  BILITOT 1.5* 0.8  PROT 7.8 6.9  ALBUMIN 3.7 3.1*   No results for input(s): LIPASE, AMYLASE in the last 168 hours. No results for input(s): AMMONIA in the last 168 hours. Coagulation Profile: Recent Labs  Lab 11/10/19 1214  INR 1.1   Cardiac Enzymes: No results for input(s): CKTOTAL, CKMB, CKMBINDEX, TROPONINI in the last 168 hours. BNP (last 3 results) No results for input(s): PROBNP in the last 8760 hours. HbA1C: No results for input(s): HGBA1C in the last 72 hours. CBG: No results for input(s): GLUCAP in the last 168 hours. Lipid Profile: No results for input(s): CHOL, HDL, LDLCALC, TRIG, CHOLHDL, LDLDIRECT in the last 72 hours. Thyroid Function Tests: Recent Labs    11/11/19 0354  TSH 8.807*   Anemia Panel: No results for input(s): VITAMINB12, FOLATE, FERRITIN, TIBC, IRON, RETICCTPCT in the last 72 hours. Sepsis Labs: Recent Labs  Lab 11/10/19 1309 11/10/19 1410  LATICACIDVEN 2.3* 1.9    Recent Results (from the past 240 hour(s))  Blood Culture (routine x 2)     Status: None (Preliminary result)   Collection Time: 11/10/19 12:03 PM   Specimen: BLOOD  Result Value Ref Range Status   Specimen Description   Final    BLOOD BLOOD LEFT FOREARM Performed at [facility], [place] [address] [place], [place] [address]    Special Requests   Final    BOTTLES DRAWN AEROBIC AND ANAEROBIC Blood Culture results may not be optimal due to an inadequate volume of blood received in culture bottles Performed at [facility] [address], [place], [place] [address]    Culture   Final    NO GROWTH < 24 HOURS Performed at [facility] [address] [place], [place] [address]    Report Status PENDING  Incomplete  Blood Culture (routine x 2)     Status: None (Preliminary result)   Collection Time: 11/10/19  1:04 PM   Specimen: BLOOD  Result Value Ref Range Status   Specimen Description   Final    BLOOD LEFT ANTECUBITAL Performed at [facility] [address] [place], [place] [address]    Special Requests   Final    BOTTLES DRAWN AEROBIC AND ANAEROBIC Blood Culture results may not be optimal due to an inadequate volume of blood received in culture bottles Performed at [facility] [address], [place], [place] [address]    Culture   Final    NO GROWTH < 24 HOURS Performed at [facility] [address], [place], [name] [address]    Report Status PENDING  Incomplete  SARS Coronavirus 2 by RT PCR (hospital order, performed in [facility] hospital lab) Nasopharyngeal Nasopharyngeal Swab     Status: None   Collection Time: 11/10/19  3:25 PM   Specimen: Nasopharyngeal Swab  Result Value Ref Range Status   SARS Coronavirus 2 NEGATIVE NEGATIVE Final    Comment: (NOTE) SARS-CoV-2 target nucleic acids are NOT DETECTED.  The SARS-CoV-2 RNA is generally detectable in upper and lower respiratory specimens during the acute phase of infection. The lowest concentration of SARS-CoV-2 viral copies this assay can detect is  250 copies / mL. A negative result does not preclude SARS-CoV-2 infection and should not be used as the sole basis for treatment or other patient management  decisions.  A negative result may occur with improper specimen collection / handling, submission of specimen other than nasopharyngeal swab, presence of viral mutation(s) within the areas targeted by this assay, and inadequate number of viral copies (<250 copies / mL). A negative result must be combined with clinical observations, patient history, and epidemiological information.  Fact Sheet for Patients:   StrictlyIdeas.no  Fact Sheet for Healthcare Providers: BankingDealers.co.za  This test is not yet approved or  cleared by the [place] FDA and has been authorized for detection and/or diagnosis of SARS-CoV-2 by FDA under an Emergency Use Authorization (EUA).  This EUA will remain in effect (meaning this test can be used) for the duration of the COVID-19 declaration under Section 564(b)(1) of the Act, 21 U.S.C. section 360bbb-3(b)(1), unless the authorization is terminated or revoked sooner.  Performed at [facility], [place] [address], [place], [place] [address]   Urine culture     Status: None   Collection Time: 11/11/19  3:00 AM   Specimen: In/Out Cath Urine  Result Value Ref Range Status   Specimen Description   Final    IN/OUT CATH URINE Performed at [facility] [address], [place], [place] [address]    Special Requests   Final    NONE Performed at [facility], [place] [address] [place], [place] [address]    Culture   Final    NO GROWTH Performed at [facility], [place] [address], [place], [place] [address]    Report Status 11/12/2019 FINAL  Final         Radiology Studies: UE VENOUS DUPLEX ([place] 7 am - 7 pm)  Result Date: 11/10/2019 UPPER VENOUS STUDY  Indications: Pain, and Swelling Risk Factors: Cancer. Limitations: Body habitus, poor ultrasound/tissue interface and patient positioning. Comparison Study: No prior studies. Performing  Technologist: Oliver Hum RVT  Examination Guidelines: A complete evaluation includes B-mode imaging, spectral Doppler, color Doppler, and power Doppler as needed of all accessible portions of each vessel. Bilateral testing is considered an integral part of a complete examination. Limited examinations for reoccurring indications may be performed as noted.  Right Findings: +----------+------------+---------+-----------+----------+-------+ RIGHT     CompressiblePhasicitySpontaneousPropertiesSummary +----------+------------+---------+-----------+----------+-------+ IJV           Full       Yes       Yes                      +----------+------------+---------+-----------+----------+-------+ Subclavian    Full       Yes       Yes                      +----------+------------+---------+-----------+----------+-------+ Axillary      Full       Yes       Yes                      +----------+------------+---------+-----------+----------+-------+ Brachial      Full       Yes       Yes                      +----------+------------+---------+-----------+----------+-------+ Radial        Full                                          +----------+------------+---------+-----------+----------+-------+  Ulnar         Full                                          +----------+------------+---------+-----------+----------+-------+ Cephalic      Full                                          +----------+------------+---------+-----------+----------+-------+ Basilic       Full                                          +----------+------------+---------+-----------+----------+-------+  Left Findings: +----------+------------+---------+-----------+----------+-------+ LEFT      CompressiblePhasicitySpontaneousPropertiesSummary +----------+------------+---------+-----------+----------+-------+ Subclavian    Full       Yes       Yes                       +----------+------------+---------+-----------+----------+-------+  Summary:  Right: No evidence of deep vein thrombosis in the upper extremity. No evidence of superficial vein thrombosis in the upper extremity.  Left: No evidence of thrombosis in the subclavian.  *See table(s) above for measurements and observations.  Diagnosing physician: Curt Jews MD Electronically signed by Curt Jews MD on 11/10/2019 at 3:43:44 PM.    Final         Scheduled Meds:  anastrozole  1 mg Oral Daily   Chlorhexidine Gluconate Cloth  6 each Topical Daily   enoxaparin (LOVENOX) injection  50 mg Subcutaneous Q24H   levothyroxine  25 mcg Oral Q0600   Continuous Infusions:  cefTRIAXone (ROCEPHIN)  IV 2 g (11/12/19 1217)     Time spent: 26 minutes with over 50% of the time coordinating the patient's care    Harold Hedge, DO [facility] Pager [phone]  Call night coverage person covering after 7pm

## 2019-11-13 LAB — CBC
HCT: 37 % (ref 36.0–46.0)
Hemoglobin: 12.2 g/dL (ref 12.0–15.0)
MCH: 35.3 pg — ABNORMAL HIGH (ref 26.0–34.0)
MCHC: 33 g/dL (ref 30.0–36.0)
MCV: 106.9 fL — ABNORMAL HIGH (ref 80.0–100.0)
Platelets: 149 10*3/uL — ABNORMAL LOW (ref 150–400)
RBC: 3.46 MIL/uL — ABNORMAL LOW (ref 3.87–5.11)
RDW: 13.6 % (ref 11.5–15.5)
WBC: 5 10*3/uL (ref 4.0–10.5)
nRBC: 0 % (ref 0.0–0.2)

## 2019-11-13 MED ORDER — LEVOTHYROXINE SODIUM 25 MCG PO TABS: 25.0000 ug | ORAL_TABLET | Freq: Every day | ORAL | 0 refills | Status: DC

## 2019-11-13 MED ORDER — AMOXICILLIN-POT CLAVULANATE 875-125 MG PO TABS
1.0000 | ORAL_TABLET | Freq: Two times a day (BID) | ORAL | 0 refills | Status: AC
Start: 2019-11-14 — End: 2019-11-17

## 2019-11-13 NOTE — Progress Notes (Signed)
Assessment unchanged. Pt verbalized understanding of dc instructions through teach back included medications, diet and follow up care. Understands To keep right arm elevated above heart as much as possible. Discharged via wc to front entrance accompanied by NT.

## 2019-11-13 NOTE — Discharge Summary (Signed)
Physician Discharge Summary  Jasmine Hood PNT:[id number] DOB: 12/03/1945   PCP: [name], [name], MD  Admit date: 11/10/2019 Discharge date: 11/13/2019 Length of Stay: 3 days   Code Status: DNR  Admitted From:  Home Discharged to:   [place]:  None  Equipment/Devices:  None Discharge Condition:  Stable  Recommendations for Outpatient Follow-up   1. Follow up with Oncologist this week 2. Reevaluate RUE for resolution of cellulitis 3. Started on synthroid, will need follow up TFTs in the future  Hospital Summary  This is a 74 year old female with history of metastatic ER positive breast cancer/PE bilateral mastectomies stable on anastrozole, OSA not on CPAP who presented with several days of right arm redness, pain and fever as well as an episode of urinary incontinence.  She was in her usual state of health until several days ago when she noted redness in her right arm spreading to her right chest wall.  Reported a history of the same in the past.  Also reported she recently got the [employer] vaccine 2 weeks ago.  In the ER, temp 101.78F, heart rate 104, lactate 2.3. Chest x-ray showed no opacities. Right upper extremity duplex showed no DVT.  Lactic acid 2.3 which resolved to 1.9, WBC 9.1 which improved to 5.8  She was started on ceftriaxone and completed 4 days of antibiotics with resolution of sepsis and improvement in cellulitis.  She was discharged on Augmentin for an additional 3 days, total 7 days antibiotics and advised to pick up a compression stocking for her arm to prevent recurrent cellulitis.  Continued to have right arm swelling which improved after elevation of limb but did not resolve.  She has a routine appoint with her oncologist on 7/7 however she was advised to follow-up, preferably this week, or with her PCP to ensure resolution of cellulitis.  Of note, patient's TSH was slightly elevated and she was restarted on Synthroid.  A & P    Principal Problem:   Sepsis ([place]) Active Problems:   Hypothyroidism   Essential hypertension   OSA (obstructive sleep apnea)   Malignant neoplasm of overlapping sites of right breast in female, estrogen receptor positive ([place])   Morbid obesity with BMI of 50.0-59.9, adult (HCC)   Transaminitis   Bone metastases ([place])    1. Sepsis from RUE cellulitis, sepsis resolved a. Septic on admission with fever, tachycardia, lactic acid and evidence of infection in right upper extremity b. RUE ultrasound was negative for DVT c. Concern for erysipelas at presentation, however cellulitis more likely related to poor lymphatic flow in right upper extremity after history of lymphadenectomy/mastectomy -occasionally has erythema and pain of RUE for which Dr. [name] had written for a compression sleeve and as needed doxycycline in the past d. Erythema on chest wall as well e. CXR unremarkable and not concerning for compression or SVC syndrome f. Completed 4 days ceftriaxone g. Arm swelling improved with elevation of limb, advised to keep extremity elevated h. RUE compression sleeve at discharge to prevent recurrence  2. Chronic transaminitis, likely [place]  3. Metastatic breast cancer, ER positive s/p bilateral mastectomies a. Continue anastrozole b. Follow-up with Dr. [name] outpatient  4. Episode of urinary incontinence in setting of sepsis, unknown etiology -resolved a. Sterile pyuria on UA b. had an unremarkable neuro exam c. If this recurs would get spinal imaging and evaluate for metastatic disease compressing cord  5. Subclinical hypothyroidism a. TSH 8.807 b. Seems to have been on and  off Synthroid in the past but patient is unable to corroborate this c. will start on levothyroxine 25 mcg d. will need outpatient follow-up labs  6. OSA not on CPAP  7. Morbid obesity a. Body mass index is 45.08 kg/m.  8. History of CVA    Consultants  . none  Procedures   . none  Antibiotics   Anti-infectives (From admission, onward)   Start     Dose/Rate Route Frequency Ordered Stop   11/14/19 0000  amoxicillin-clavulanate (AUGMENTIN) 875-125 MG tablet     Discontinue     1 tablet Oral 2 times daily 11/13/19 1405 11/17/19 2359   11/10/19 1215  cefTRIAXone (ROCEPHIN) 2 g in sodium chloride 0.9 % 100 mL IVPB     Discontinue     2 g 200 mL/hr over 30 Minutes Intravenous Every 24 hours 11/10/19 1204         Subjective  Patient seen and examined at bedside no acute distress and resting comfortably.  No events overnight.  Tolerating diet.  Reported the swelling had not gone down much this a.m. but redness have improved somewhat.  I advised her to elevate the limb and I would reevaluate later this afternoon.  Upon reevaluation patient's erythema and swelling did appear to decrease some  Denies any chest pain, shortness of breath, fever, nausea, vomiting, urinary or bowel complaints. Otherwise ROS negative    Objective   Discharge Exam: Vitals:   11/12/19 2137 11/13/19 0554  BP: (!) 147/73 (!) 150/66  Pulse: 81 75  Resp: 18 18  Temp: 98.7 F (37.1 C) 98.1 F (36.7 C)  SpO2: 98% 98%   Vitals:   11/12/19 0625 11/12/19 1317 11/12/19 2137 11/13/19 0554  BP: (!) 148/70 (!) 153/67 (!) 147/73 (!) 150/66  Pulse: 79 80 81 75  Resp: 18 18 18 18   Temp: 98 F (36.7 C) 97.8 F (36.6 C) 98.7 F (37.1 C) 98.1 F (36.7 C)  TempSrc: Oral  Oral Oral  SpO2: 95% 97% 98% 98%  Weight:      Height:        Physical Exam Vitals and nursing note reviewed.  Constitutional:      Appearance: Normal appearance.  HENT:     Head: Normocephalic and atraumatic.  Cardiovascular:     Rate and Rhythm: Normal rate and regular rhythm.  Pulmonary:     Effort: Pulmonary effort is normal.     Breath sounds: Normal breath sounds.  Abdominal:     General: Abdomen is flat.     Palpations: Abdomen is soft.  Musculoskeletal:       Arms:  Skin:    Coloration: Skin is  not jaundiced or pale.  Neurological:     Mental Status: She is alert. Mental status is at baseline.  Psychiatric:        Mood and Affect: Mood normal.        Behavior: Behavior normal.       The results of significant diagnostics from this hospitalization (including imaging, microbiology, ancillary and laboratory) are listed below for reference.     Microbiology: Recent Results (from the past 240 hour(s))  Blood Culture (routine x 2)     Status: None (Preliminary result)   Collection Time: 11/10/19 12:03 PM   Specimen: BLOOD LEFT FOREARM  Result Value Ref Range Status   Specimen Description   Final    BLOOD LEFT FOREARM Performed at [facility], [address], [place], [place] [address]  Special Requests   Final    BOTTLES DRAWN AEROBIC AND ANAEROBIC Blood Culture results may not be optimal due to an inadequate volume of blood received in culture bottles Performed at [facility] [address], [place], [place] [address]    Culture   Final    NO GROWTH 3 DAYS Performed at [facility], [place] [address], [place], [place] [address]    Report Status PENDING  Incomplete  Blood Culture (routine x 2)     Status: None (Preliminary result)   Collection Time: 11/10/19  1:04 PM   Specimen: BLOOD  Result Value Ref Range Status   Specimen Description   Final    BLOOD LEFT ANTECUBITAL Performed at [facility] [address], [place], [place] [address]    Special Requests   Final    BOTTLES DRAWN AEROBIC AND ANAEROBIC Blood Culture results may not be optimal due to an inadequate volume of blood received in culture bottles Performed at [facility] [address], [place], [place] [address]    Culture   Final    NO GROWTH 3 DAYS Performed at [facility], [place] [address] [place], [place] [address]    Report Status PENDING  Incomplete  SARS Coronavirus 2 by RT PCR (hospital order, performed in [facility] hospital lab) Nasopharyngeal Nasopharyngeal Swab     Status: None   Collection Time: 11/10/19  3:25 PM   Specimen: Nasopharyngeal Swab  Result Value Ref Range Status   SARS Coronavirus 2 NEGATIVE NEGATIVE Final    Comment: (NOTE) SARS-CoV-2 target nucleic acids are NOT DETECTED.  The SARS-CoV-2 RNA is generally detectable in upper and lower respiratory specimens during the acute phase of infection. The lowest concentration of SARS-CoV-2 viral copies this assay can detect is 250 copies / mL. A negative result does not preclude SARS-CoV-2 infection and should not be used as the sole basis for treatment or other patient management decisions.  A negative result may occur with improper specimen collection / handling, submission of specimen other than nasopharyngeal swab, presence of viral mutation(s) within the areas targeted by this assay, and inadequate number of viral copies (<250 copies / mL). A negative result must be combined with clinical observations, patient history, and epidemiological information.  Fact Sheet for Patients:   StrictlyIdeas.no  Fact Sheet for Healthcare Providers: BankingDealers.co.za  This test is not yet approved or  cleared by the [place] FDA and has been authorized for detection and/or diagnosis of SARS-CoV-2 by FDA under an Emergency Use Authorization (EUA).  This EUA will remain in effect (meaning this test can be used) for the duration of the COVID-19 declaration under Section 564(b)(1) of the Act, 21 U.S.C. section 360bbb-3(b)(1), unless the authorization is terminated or revoked sooner.  Performed at [facility], [place] [address], [place], [place] [address]   Urine culture     Status: None   Collection Time: 11/11/19  3:00 AM   Specimen: In/Out Cath Urine  Result Value Ref Range Status   Specimen Description   Final    IN/OUT CATH URINE Performed at [facility] [address], [place], [place] [address]    Special Requests   Final    NONE Performed at [facility], [place] [address], [place], [place] [address]    Culture   Final    NO GROWTH Performed at [facility], [place] [address],  [place], [place] [address]    Report Status 11/12/2019 FINAL  Final     Labs: BNP (last 3 results) No results for input(s): BNP in the last 8760 hours. Basic Metabolic Panel: Recent Labs  Lab 11/10/19 1214 11/11/19 0354 11/12/19 0409  NA 136 138 136  K 4.0 3.4* 3.6  CL 100 102 103  CO2 24 25 26   GLUCOSE 168* 158* 155*  BUN 8 9 10   CREATININE 0.57 0.67 0.61  CALCIUM 9.2 8.7* 8.4*   Liver Function Tests: Recent Labs  Lab 11/10/19 1214 11/11/19 0354  AST 98* 75*  ALT 59* 50*  ALKPHOS 122 113  BILITOT 1.5* 0.8  PROT 7.8 6.9  ALBUMIN 3.7 3.1*   No results for input(s): LIPASE, AMYLASE in the last 168 hours. No results for input(s): AMMONIA in the last 168 hours. CBC: Recent Labs  Lab 11/10/19 1214 11/11/19 0354 11/12/19 0409 11/13/19 0902  WBC 9.1 5.8 5.2 5.0  NEUTROABS 7.7  --   --   --   HGB 13.9 12.9 12.2 12.2  HCT 42.1 38.8 38.0 37.0  MCV 105.8* 104.9* 107.6* 106.9*  PLT 155 142* 137* 149*   Cardiac Enzymes: No results for input(s): CKTOTAL, CKMB, CKMBINDEX, TROPONINI in the last 168 hours. BNP: Invalid input(s): POCBNP CBG: No results for input(s): GLUCAP in the last 168 hours. D-Dimer Recent Labs    11/12/19 1026  DDIMER 0.79*   Hgb A1c No results for input(s): HGBA1C in the last 72 hours. Lipid Profile No results for input(s): CHOL, HDL, LDLCALC, TRIG, CHOLHDL, LDLDIRECT in the last 72 hours. Thyroid function studies Recent Labs    11/11/19 0354  TSH 8.807*   Anemia work up No results for input(s): VITAMINB12, FOLATE, FERRITIN, TIBC, IRON, RETICCTPCT in the last 72 hours. Urinalysis    Component Value Date/Time   COLORURINE YELLOW 11/11/2019 0300   APPEARANCEUR  CLEAR 11/11/2019 0300   LABSPEC 1.015 11/11/2019 0300   PHURINE 5.0 11/11/2019 0300   GLUCOSEU NEGATIVE 11/11/2019 0300   GLUCOSEU NEGATIVE 01/25/2010 0858   HGBUR NEGATIVE 11/11/2019 0300   HGBUR 1+ 12/30/2008 0838   BILIRUBINUR NEGATIVE 11/11/2019 0300   BILIRUBINUR Neg 04/28/2015 1122   KETONESUR NEGATIVE 11/11/2019 0300   PROTEINUR NEGATIVE 11/11/2019 0300   UROBILINOGEN 0.2 04/28/2015 1122   UROBILINOGEN 0.2 01/25/2010 0858   NITRITE NEGATIVE 11/11/2019 0300   LEUKOCYTESUR TRACE (A) 11/11/2019 0300   Sepsis Labs Invalid input(s): PROCALCITONIN,  WBC,  LACTICIDVEN Microbiology Recent Results (from the past 240 hour(s))  Blood Culture (routine x 2)     Status: None (Preliminary result)   Collection Time: 11/10/19 12:03 PM   Specimen: BLOOD LEFT FOREARM  Result Value Ref Range Status   Specimen Description   Final    BLOOD LEFT FOREARM Performed at [facility], [place] [address], [place], [place] [address]    Special Requests   Final    BOTTLES DRAWN AEROBIC AND ANAEROBIC Blood Culture results may not be optimal due to an inadequate volume of blood received in culture bottles Performed at [facility] [address], [place], [place] [address]    Culture   Final    NO GROWTH 3 DAYS Performed at [facility], [place] [address], [place], [place] [address]    Report Status PENDING  Incomplete  Blood Culture (routine x 2)     Status: None (Preliminary result)   Collection Time: 11/10/19  1:04 PM   Specimen: BLOOD  Result Value  Ref Range Status   Specimen Description   Final    BLOOD LEFT ANTECUBITAL Performed at [facility] [address], [place], [place] [address]    Special Requests   Final    BOTTLES DRAWN AEROBIC AND ANAEROBIC Blood Culture results may not be optimal due to an inadequate volume of blood received in culture bottles Performed at [facility] [address] [place], [place] [address]     Culture   Final    NO GROWTH 3 DAYS Performed at [facility], [place] [address] [place], [place] [address]    Report Status PENDING  Incomplete  SARS Coronavirus 2 by RT PCR (hospital order, performed in [facility] hospital lab) Nasopharyngeal Nasopharyngeal Swab     Status: None   Collection Time: 11/10/19  3:25 PM   Specimen: Nasopharyngeal Swab  Result Value Ref Range Status   SARS Coronavirus 2 NEGATIVE NEGATIVE Final    Comment: (NOTE) SARS-CoV-2 target nucleic acids are NOT DETECTED.  The SARS-CoV-2 RNA is generally detectable in upper and lower respiratory specimens during the acute phase of infection. The lowest concentration of SARS-CoV-2 viral copies this assay can detect is 250 copies / mL. A negative result does not preclude SARS-CoV-2 infection and should not be used as the sole basis for treatment or other patient management decisions.  A negative result may occur with improper specimen collection / handling, submission of specimen other than nasopharyngeal swab, presence of viral mutation(s) within the areas targeted by this assay, and inadequate number of viral copies (<250 copies / mL). A negative result must be combined with clinical observations, patient history, and epidemiological information.  Fact Sheet for Patients:   StrictlyIdeas.no  Fact Sheet for Healthcare Providers: BankingDealers.co.za  This test is not yet approved or  cleared by the [place] FDA and has been authorized for detection and/or diagnosis of SARS-CoV-2 by FDA under an Emergency Use Authorization (EUA).  This EUA will remain in effect (meaning this test can be used) for the duration of the COVID-19 declaration under Section 564(b)(1) of the Act, 21 U.S.C. section 360bbb-3(b)(1), unless the authorization is terminated or revoked sooner.  Performed at [facility], [place] [address], [place], [place] [address]    Urine culture     Status: None   Collection Time: 11/11/19  3:00 AM   Specimen: In/Out Cath Urine  Result Value Ref Range Status   Specimen Description   Final    IN/OUT CATH URINE Performed at [facility] [address], [place], [place] [address]    Special Requests   Final    NONE Performed at [facility], [place] [address], [place], [place] [address]    Culture   Final    NO GROWTH Performed at [facility], [place] [address] [place], [place] [address]    Report Status 11/12/2019 FINAL  Final    Discharge Instructions     Discharge Instructions    Diet - low sodium heart healthy   Complete by: As directed    Discharge instructions   Complete by: As directed    - start taking Augmentin twice daily for the next 3 days starting Sunday. Monitor for persistent/severe diarrhea while on this medication and notify your physician if this occurs. - start taking Levothyroxine 25 mcg daily on an empty stomach for low thyroid and have this reevaluated by your primary care physician - pickup a compression stocking  for your right arm which you should wear daily to help prevent recurrent skin infections/swelling - keep your arm elevated at/above heart level to help prevent swelling when possible throughout the day - follow up with your oncologist, preferably this week if possible so he can reevaluate your arm If you have any significant change or worsening of your symptoms, do not hesitate to contact your primary care physician or return to the ED.   Increase activity slowly   Complete by: As directed      Allergies as of 11/13/2019      Reactions   Bee Venom Anaphylaxis   Latex Swelling      Medication List    STOP taking these medications   doxycycline 100 MG tablet Commonly known as: VIBRA-TABS     TAKE these medications   amoxicillin-clavulanate 875-125 MG tablet Commonly known as: Augmentin Take 1 tablet by mouth 2 (two) times  daily for 3 days. Start taking on: November 14, 2019   anastrozole 1 MG tablet Commonly known as: ARIMIDEX Take 1 tablet (1 mg total) by mouth daily.   calcium carbonate 500 MG chewable tablet Commonly known as: Tums Chew 1 tablet (200 mg of elemental calcium total) by mouth 2 (two) times daily.   levothyroxine 25 MCG tablet Commonly known as: SYNTHROID Take 1 tablet (25 mcg total) by mouth daily at 6 (six) AM.       Allergies  Allergen Reactions  . Bee Venom Anaphylaxis  . Latex Swelling     Dispo: The patient is from: Home              Anticipated d/c is to: Home              Anticipated d/c date is today              Patient currently is medically stable to d/c.       Time coordinating discharge: Over 30 minutes   SIGNED:   Harold Hedge, D.O. [facility] Pager: [phone]  11/13/2019, 2:05 PM

## 2019-11-13 NOTE — Evaluation (Signed)
Physical Therapy Evaluation Patient Details Name: Jasmine Hood MRN: [id number] DOB: 10-28-1945 Today's Date: 11/13/2019   History of Present Illness  74 year old female with history of metastatic ER positive breast cancer/PE, bilateral mastectomies stable on anastrozole, OSA not on CPAP who presented with several days of right arm redness, pain and fever as well as an episode of urinary incontinence.  She was in her usual state of health until several days ago when she noted redness in her right arm spreading to her right chest wall.  Reported a history of the same in the past.  Also reported she recently got the [employer] vaccine 2 weeks ago.  Clinical Impression  Pt admitted with above diagnosis.  Pt currently with functional limitations due to the deficits listed below (see PT Problem List). Pt will benefit from skilled PT to increase their independence and safety with mobility to allow discharge to the venue listed below.   Pt very pleasant and cooperative.  Pt assisted to bathroom and then ambulated in hallway with RW.  Pt has daughter 24/7 assist at home if needed.    Follow Up Recommendations No PT follow up    Equipment Recommendations  None recommended by PT    Recommendations for Other Services       Precautions / Restrictions Precautions Precautions: None      Mobility  Bed Mobility Overal bed mobility: Needs Assistance Bed Mobility: Supine to Sit     Supine to sit: Min guard;HOB elevated     General bed mobility comments: pt required a hand to self assist trunk upright however no physical assist required  Transfers Overall transfer level: Needs assistance Equipment used: None Transfers: Sit to/from Stand Sit to Stand: Min guard;From elevated surface         General transfer comment: pt 4'11" so all surfaces are high for her, min/guard for safety  Ambulation/Gait Ambulation/Gait assistance: Min guard Gait Distance (Feet): 350 Feet Assistive  device: Rolling walker (2 wheeled) Gait Pattern/deviations: Step-through pattern;Decreased stride length     General Gait Details: slow but steady pace, one standing rest break required  Stairs            Wheelchair Mobility    Modified Rankin (Stroke Patients Only)       Balance Overall balance assessment: Mild deficits observed, not formally tested (pt denies any falls)                                           Pertinent Vitals/Pain Pain Assessment: No/denies pain    Home Living Family/patient expects to be discharged to:: Private residence Living Arrangements: Children (daughter) Available Help at Discharge: Family Type of Home: House Home Access: Level entry     Home Layout: One level Home Equipment: Environmental consultant - 2 wheels;Walker - 4 wheels      Prior Function     Gait / Transfers Assistance Needed: uses RW in home and rollator for community  ADL's / Homemaking Assistance Needed: daughter assists as needed        Journalist, newspaper        Extremity/Trunk Assessment   Upper Extremity Assessment Upper Extremity Assessment: RUE deficits/detail RUE Deficits / Details: R UE edema, elevated with pillows    Lower Extremity Assessment Lower Extremity Assessment: Overall WFL for tasks assessed    Cervical / Trunk Assessment Cervical / Trunk Assessment: Normal  Communication   Communication: No difficulties  Cognition Arousal/Alertness: Awake/alert Behavior During Therapy: WFL for tasks assessed/performed Overall Cognitive Status: Within Functional Limits for tasks assessed                                        General Comments      Exercises     Assessment/Plan    PT Assessment Patient needs continued PT services  PT Problem List Decreased mobility;Decreased activity tolerance       PT Treatment Interventions DME instruction;Therapeutic exercise;Functional mobility training;Patient/family education;Gait  training;Therapeutic activities;Balance training    PT Goals (Current goals can be found in the Care Plan section)  Acute Rehab PT Goals PT Goal Formulation: With patient Time For Goal Achievement: 11/27/19 Potential to Achieve Goals: Good    Frequency Min 3X/week   Barriers to discharge        Co-evaluation               AM-PAC PT "6 Clicks" Mobility  Outcome Measure Help needed turning from your back to your side while in a flat bed without using bedrails?: A Little Help needed moving from lying on your back to sitting on the side of a flat bed without using bedrails?: A Little Help needed moving to and from a bed to a chair (including a wheelchair)?: A Little Help needed standing up from a chair using your arms (e.g., wheelchair or bedside chair)?: A Little Help needed to walk in hospital room?: A Little Help needed climbing 3-5 steps with a railing? : A Little 6 Click Score: 18    End of Session Equipment Utilized During Treatment: Gait belt Activity Tolerance: Patient tolerated treatment well Patient left: in chair;with call bell/phone within reach Nurse Communication: Mobility status PT Visit Diagnosis: Other abnormalities of gait and mobility (R26.89)    Time: 1040-1059 PT Time Calculation (min) (ACUTE ONLY): 19 min   Charges:   PT Evaluation $PT Eval Low Complexity: 1 Low     Kati PT, DPT Acute Rehabilitation Services Pager: [phone] Office: [phone]  York Ram E 11/13/2019, 11:17 AM

## 2019-11-15 LAB — CULTURE, BLOOD (ROUTINE X 2)
Culture: NO GROWTH
Culture: NO GROWTH

## 2019-11-28 NOTE — Progress Notes (Signed)
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  However she did not show for her visit 11/29/2019     REVIEW OF SYSTEMS: [place]     BREAST CANCER HISTORY: From the original intake note  Avo herself noted a change in her right breast sometime in September 2017. She did not immediately bring it to medical attention. When she noted some significant changes in her right nipple she saw [name] and was set up for bilateral diagnostic mammography with tomography and [facility]. This found the breast density to be category B. In the upper and lower outer quadrants of the right breast there was a mass measuring at least 10 cm. There were also groups of heterogeneous calcifications measuring 3 cm and a separate group 0.4 cm. On physical exam there was a palpable firm mass measuring approximately 10 cm involving the upper outer and lower outer quadrant of the right breast, with skin reaction and nipple retraction. There was no palpable right axillary adenopathy.  Right breast ultrasonography confirmed a large hypoechoic mass with irregular margins extending to the skin surface. The right axilla showed 2  lymph nodes which appeared abnormal.  In the left breast there were some indeterminate retroareolar calcifications which were felt to warrant biopsy. This was performed 10/08/2016 and showed a fibroadenoma ([id number]).  Biopsy of the right breast mass at the 7:00 and 10:00 positions as well as one of the suspicious lymph nodes all showed invasive ductal carcinoma, E-cadherin positive. Separate prognostic profiles from the 2 breast masses were sent. Estrogen receptor was positive at 95-70%, progesterone receptor was positive at 15-85%, all with strong staining intensity, the MIB-1 was 15-20%, and HER-2 was nonamplified, the signals ratio being 1.28-1.72 and the number per cell 1.85-3.95.  The patient's subsequent history is as detailed below   PAST MEDICAL HISTORY: Past Medical History:  Diagnosis Date   Breast cancer ([place]) 10/07/2016   right breast, spine   Brown recluse spider bite    right leg   COLONIC POLYPS, HX OF 01/05/2009   Qualifier: Diagnosis of  By: [name]  MD, [name]    Complication of anesthesia    slow to awaken after wisdom  teeth extraction age 74   Constipation    Genetic testing 76/03/2017   Ms. Crupi underwent genetic counseling and testing for hereditary cancer syndromes on 11/19/2016. Her results were negative for mutations in all 46 genes analyzed by [facility]'s 46-gene Common Hereditary Cancers Panel. Genes analyzed include: APC, ATM, AXIN2, BARD1, BMPR1A, BRCA1, BRCA2, BRIP1, CDH1, CDKN2A, CHEK2, CTNNA1, DICER1, EPCAM, GREM1, HOXB13, KIT, MEN1, MLH1, MSH2, MSH3, MSH6, MUTYH, NBN,   Hypothyroidism    Morbid obesity ([place]) 01/05/2009   Qualifier: Diagnosis of  By: [name]  MD, [name]    Pneumonia 1987   Sleep apnea  no cpap yet, has not recieved results from home test yet   Stroke [facility])    found on CT scan, no deficits    PAST SURGICAL HISTORY: Past Surgical History:  Procedure Laterality Date   BILATERAL TOTAL MASTECTOMY WITH AXILLARY  LYMPH NODE DISSECTION Bilateral 04/04/2017   Procedure: RIGHT MASTECTOMY WITH TARGETED RIGHT AXILLARY NODE DISSECTION AND LEFT PROPHYLATIC MASTECTOMY ERAS PATHWAY;  Surgeon: Alphonsa Overall, MD;  Location: [place];  Service: General;  Laterality: Bilateral;  PEC BLOCK   BREAST BIOPSY Right 10/07/2016   invasive mammary carcinoma   BREAST BIOPSY Left 10/08/2016   breast fibroadenoma no malignancy   BREAST REDUCTION SURGERY Bilateral 10/28/2018   Procedure: Excision of bilateral excess breast tissue;  Surgeon: [name], DO;  Location: [facility];  Service: Plastics;  Laterality: Bilateral;  2 hours, please.   CATARACT EXTRACTION Bilateral    COLONOSCOPY W/ BIOPSIES AND POLYPECTOMY  2012   COLONOSCOPY WITH PROPOFOL N/A 12/22/2015   Procedure: COLONOSCOPY WITH PROPOFOL;  Surgeon: [name], MD;  [facility];  Service: Endoscopy;  Laterality: N/A;   INCISION AND DRAINAGE Right 1998   surgery for spider bite on RLE; "[name]"   MASTECTOMY Left 04/04/2017   /notes   MASTECTOMY WITH AXILLARY LYMPH NODE DISSECTION Right 04/04/2017   MOLE REMOVAL  10/29/2016   Procedure: MOLE REMOVAL LEFT CHEST;  Surgeon: Alphonsa Overall, MD;  [facility];  Service: General;;   PORT-A-CATH REMOVAL Left 08/05/2018   Procedure: REMOVAL PORT-A-CATH;  Surgeon: [name], DO;  Location: [place];  Service: Plastics;  Laterality: Left;   PORTACATH PLACEMENT N/A 10/29/2016   Procedure: INSERTION PORT-A-CATH;  Surgeon: Alphonsa Overall, MD;  [facility];  Service: General;  Laterality: N/A;   TUBAL LIGATION     WISDOM TOOTH EXTRACTION  age 74    FAMILY HISTORY Family History  Problem Relation Age of Onset   Breast cancer Mother 79       d.89 from metastatic disease   Throat cancer Father        d.65 history of smoking   Rheum arthritis Sister        3 sisters pos. for osteo and RA   Diabetes Maternal Aunt    Cancer  Maternal Grandfather        mouth cancer-chewed tobacco   Breast cancer Cousin 32   Breast cancer Daughter 80   Stomach cancer Sister 48  The patient's father died at the age of 47 from laryngeal cancer in the setting of tobacco abuse. The patient's mother died at the age of 59 from metastatic breast cancer which had been diagnosed in her early 65s. The patient has 2 half-sisters and one half-brother. One of her sisters has "stomach" cancer. There is also a cousin on the maternal side with breast cancer diagnosed at age 8. No family member has been genetically tested yet   GYNECOLOGIC HISTORY:  No LMP recorded. Patient is postmenopausal. Menarche age 45, first live birth age 41, she is [place] P5. She is status post bilateral tubal ligation. She stopped having periods in 1985. She tells me she did not use hormone replacement. However a note from her bone density scan December 2001 states "this patient began hormone replacement therapy 3 months ago"   SOCIAL HISTORY:  She is originally from [place]. She worked for [employer], but she is now retired. She has survived 2 husbands. At home currently is  just she and her oldest daughter Maribelle Hopple. She tells me this daughter had mild brain damage at birth and is not working, and cannot drive because of a history of seizures. However she is the one she is planning to name is her healthcare power of attorney. The patient has 13 grandchildren and 4 great-grandchildren. She attends a [employer]: At the 10/21/2016 visit the patient was given the appropriate documents to complete and notarize at her discretion.  She intends to name her youngest duaghter Dot Lanes, lives in [place], as her healthcare power of attorney.    HEALTH MAINTENANCE: Social History   Tobacco Use   Smoking status: Never Smoker   Smokeless tobacco: Never Used  Vaping Use   Vaping Use: Never used  Substance Use Topics    Alcohol use: No   Drug use: No     Colonoscopy: 12/22/2015/Nandigam  PAP:  Bone density: 12/18/2017 with a T score of -0.7   Allergies  Allergen Reactions   Bee Venom Anaphylaxis   Latex Swelling    Current Outpatient Medications  Medication Sig Dispense Refill   anastrozole (ARIMIDEX) 1 MG tablet Take 1 tablet (1 mg total) by mouth daily. 90 tablet 4   calcium carbonate (TUMS) 500 MG chewable tablet Chew 1 tablet (200 mg of elemental calcium total) by mouth 2 (two) times daily. (Patient not taking: Reported on 11/10/2019) 120 tablet 4   levothyroxine (SYNTHROID) 25 MCG tablet Take 1 tablet (25 mcg total) by mouth daily at 6 (six) AM. 30 tablet 0   No current facility-administered medications for this visit.    OBJECTIVE:  There were no vitals filed for this visit. Wt Readings from Last 3 Encounters:  11/10/19 227 lb (103 kg)  06/14/19 235 lb 8 oz (106.8 kg)  12/29/18 227 lb 12.8 oz (103.3 kg)   There is no height or weight on file to calculate BMI.    ECOG FS:     LAB RESULTS:  CMP     Component Value Date/Time   NA 136 11/12/2019 0409   NA 140 03/21/2017 0918   K 3.6 11/12/2019 0409   K 3.7 03/21/2017 0918   CL 103 11/12/2019 0409   CO2 26 11/12/2019 0409   CO2 23 03/21/2017 0918   GLUCOSE 155 (H) 11/12/2019 0409   GLUCOSE 120 03/21/2017 0918   BUN 10 11/12/2019 0409   BUN 6.6 (L) 03/21/2017 0918   CREATININE 0.61 11/12/2019 0409   CREATININE 0.6 03/21/2017 0918   CALCIUM 8.4 (L) 11/12/2019 0409   CALCIUM 8.6 03/21/2017 0918   PROT 6.9 11/11/2019 0354   PROT 6.3 (L) 03/21/2017 0918   ALBUMIN 3.1 (L) 11/11/2019 0354   ALBUMIN 2.4 (L) 03/21/2017 0918   AST 75 (H) 11/11/2019 0354   AST 53 (H) 03/21/2017 0918   ALT 50 (H) 11/11/2019 0354   ALT 29 03/21/2017 0918   ALKPHOS 113 11/11/2019 0354   ALKPHOS 123 03/21/2017 0918   BILITOT 0.8 11/11/2019 0354   BILITOT 0.55 03/21/2017 0918   GFRNONAA >60 11/12/2019 0409   GFRAA >60 11/12/2019 0409     No results found for: TOTALPROTELP, ALBUMINELP, A1GS, A2GS, BETS, BETA2SER, GAMS, MSPIKE, SPEI  No results found for: KPAFRELGTCHN, LAMBDASER, KAPLAMBRATIO  Lab Results  Component Value Date   WBC 5.0 11/13/2019   NEUTROABS 7.7 11/10/2019   HGB 12.2 11/13/2019   HCT 37.0 11/13/2019   MCV 106.9 (H) 11/13/2019   PLT 149 (L) 11/13/2019  Chemistry      Component Value Date/Time   NA 136 11/12/2019 0409   NA 140 03/21/2017 0918   K 3.6 11/12/2019 0409   K 3.7 03/21/2017 0918   CL 103 11/12/2019 0409   CO2 26 11/12/2019 0409   CO2 23 03/21/2017 0918   BUN 10 11/12/2019 0409   BUN 6.6 (L) 03/21/2017 0918   CREATININE 0.61 11/12/2019 0409   CREATININE 0.6 03/21/2017 0918      Component Value Date/Time   CALCIUM 8.4 (L) 11/12/2019 0409   CALCIUM 8.6 03/21/2017 0918   ALKPHOS 113 11/11/2019 0354   ALKPHOS 123 03/21/2017 0918   AST 75 (H) 11/11/2019 0354   AST 53 (H) 03/21/2017 0918   ALT 50 (H) 11/11/2019 0354   ALT 29 03/21/2017 0918   BILITOT 0.8 11/11/2019 0354   BILITOT 0.55 03/21/2017 0918       No results found for: LABCA2  No components found for: [id number]  No results for input(s): INR in the last 168 hours.  Urinalysis    Component Value Date/Time   COLORURINE YELLOW 11/11/2019 0300   APPEARANCEUR CLEAR 11/11/2019 0300   LABSPEC 1.015 11/11/2019 0300   PHURINE 5.0 11/11/2019 0300   GLUCOSEU NEGATIVE 11/11/2019 0300   GLUCOSEU NEGATIVE 01/25/2010 0858   HGBUR NEGATIVE 11/11/2019 0300   HGBUR 1+ 12/30/2008 0838   BILIRUBINUR NEGATIVE 11/11/2019 0300   BILIRUBINUR Neg 04/28/2015 1122   KETONESUR NEGATIVE 11/11/2019 0300   PROTEINUR NEGATIVE 11/11/2019 0300   UROBILINOGEN 0.2 04/28/2015 1122   UROBILINOGEN 0.2 01/25/2010 0858   NITRITE NEGATIVE 11/11/2019 0300   LEUKOCYTESUR TRACE (A) 11/11/2019 0300     STUDIES: DG Chest Portable 1 View  Result Date: 11/10/2019 CLINICAL DATA:  Right arm swelling with erythema extending across the arm  and chest. Chest pain and fever. History of stage IV breast cancer. EXAM: PORTABLE CHEST 1 VIEW COMPARISON:  Radiographs 05/21/2018 and 02/19/2017.  CT 06/08/2018. FINDINGS: 1351 hours. The heart size and mediastinal contours are stable. The heart size is at the upper limits of normal for portable technique. Grossly stable volume loss and radiation changes superiorly in the right hemithorax. No airspace disease, pleural effusion or pneumothorax. The bones appear unremarkable. Telemetry leads overlie the chest. IMPRESSION: No acute chest findings or explanation for the patient's symptoms. Grossly stable radiation changes in the right hemithorax. Electronically Signed   By: Richardean Sale M.D.   On: 11/10/2019 14:33   UE VENOUS DUPLEX ([facility] 7 am - 7 pm)  Result Date: 11/10/2019 UPPER VENOUS STUDY  Indications: Pain, and Swelling Risk Factors: Cancer. Limitations: Body habitus, poor ultrasound/tissue interface and patient positioning. Comparison Study: No prior studies. Performing Technologist: Oliver Hum RVT  Examination Guidelines: A complete evaluation includes B-mode imaging, spectral Doppler, color Doppler, and power Doppler as needed of all accessible portions of each vessel. Bilateral testing is considered an integral part of a complete examination. Limited examinations for reoccurring indications may be performed as noted.  Right Findings: +----------+------------+---------+-----------+----------+-------+  RIGHT      Compressible Phasicity Spontaneous Properties Summary  +----------+------------+---------+-----------+----------+-------+  IJV            Full        Yes        Yes                         +----------+------------+---------+-----------+----------+-------+  Subclavian     Full        Yes  Yes                         +----------+------------+---------+-----------+----------+-------+  Axillary       Full        Yes        Yes                          +----------+------------+---------+-----------+----------+-------+  Brachial       Full        Yes        Yes                         +----------+------------+---------+-----------+----------+-------+  Radial         Full                                               +----------+------------+---------+-----------+----------+-------+  Ulnar          Full                                               +----------+------------+---------+-----------+----------+-------+  Cephalic       Full                                               +----------+------------+---------+-----------+----------+-------+  Basilic        Full                                               +----------+------------+---------+-----------+----------+-------+  Left Findings: +----------+------------+---------+-----------+----------+-------+  LEFT       Compressible Phasicity Spontaneous Properties Summary  +----------+------------+---------+-----------+----------+-------+  Subclavian     Full        Yes        Yes                         +----------+------------+---------+-----------+----------+-------+  Summary:  Right: No evidence of deep vein thrombosis in the upper extremity. No evidence of superficial vein thrombosis in the upper extremity.  Left: No evidence of thrombosis in the subclavian.  *See table(s) above for measurements and observations.  Diagnosing physician: Curt Jews MD Electronically signed by Curt Jews MD on 11/10/2019 at 3:43:44 PM.    Final      ELIGIBLE FOR AVAILABLE RESEARCH PROTOCOL: no  ASSESSMENT: 74 y.o. Ignacia Palma, [place] woman status post right breast overlapping sites biopsy 2 and lymph node biopsy 10/07/2016 all positive for an invasive ductal carcinoma, grade 2, estrogen and progesterone receptor positive, HER-2 nonamplified, with an MIB-1 between 15 and 20%.--This is clinical stage IIIB  (a) breast MRI suggests left-sided disease, biopsy 11/18/2016 shows atypical ductal hyperplasia  (1) genetics testing  11/19/2016--Genetic counseling and testing for hereditary cancer syndromes performed on 11/19/2016. Results are negative for pathogenic mutations in 46 genes analyzed by [facility]'s Common Hereditary Cancers Panel. Results are dated 11/26/2016. Genes tested: APC, ATM, AXIN2, BARD1, BMPR1A,  BRCA1, BRCA2, BRIP1, CDH1, CDKN2A, CHEK2, CTNNA1, DICER1, EPCAM, GREM1, HOXB13, KIT, MEN1, MLH1, MSH2, MSH3, MSH6, MUTYH, NBN, NF1, NTHL1, PALB2, PDGFRA, PMS2, POLD1, POLE, PTEN, RAD50, RAD51C, RAD51D, SDHA, SDHB, SDHC, SDHD, SMAD4, SMARCA4, STK11, TP53, TSC1, TSC2, and VHL.  (a) A variant of uncertain significance (not clinically actionable) was noted in CTNNA1.    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 There was a 4 cm right breast mass with right axillary lymph nodes and evidence of cirrhosis.  (3) neoadjuvant treatment consisting of cyclophosphamide, methotrexate, and fluorouracil (CMF) chemotherapy every 21 days 6, starting 11/05/2016, last dose 02/18/2017  (4) Bilateral mastectomies on 04/04/2017 left breast: DCIS, 1.2 cm, margins negative, right Breast: IMC predominantly lobular, grade 2, 8 cm, Posterior margin focally positive, 10/10 lymph noes involved.  Both are ER/PR positive, invasive tumor was HER-2 negative (ratio 1.48).  (a) the patient is status post revision of some of the scar irregularities under Dr. Marla Roe 10/28/2018  (5) adjuvant radiation from 06/02/2017-07/10/2017: 1) Right chest wall/ 50.4 Gy in 28 fractions.  2) L-spine/ 30 Gy in 10 fractions  (6) anastrozole started 09/2017  (a) bone density 12/18/2017 showed a T score of -0.7 normal (although scan quality was limited and lumbar spine was not utilized due to advanced degenerative changes. )  (b)  denosumab/Xgeva restarted on 11/28/17, given monthly; changed to every 6 months after January 2020 dose  (7) staging studies: CT of  the chest with contrast and bone scan 06/08/2018 shows stable bony, no visceral disease disease   PLAN: The patient did not show for her visit 11/29/2019.  Follow-up letter has been sent   Masahiro Iglesia, Virgie Dad, MD  11/29/19 2:31 PM Medical Oncology and Hematology [facility] [place], [place] [address] Tel. [phone]    Fax. [phone]   I, Jacqualyn [name] am acting as a Education administrator for Chauncey Cruel, MD.   I, Lurline Del MD, have reviewed the above documentation for accuracy and completeness, and I agree with the above.     *Total Encounter Time as defined by the Centers for Medicare and Medicaid Services includes, in addition to the face-to-face time of a patient visit (documented in the note above) non-face-to-face time: obtaining and reviewing outside history, ordering and reviewing medications, tests or procedures, care coordination (communications with other health care professionals or caregivers) and documentation in the medical record.

## 2019-11-29 ENCOUNTER — Inpatient Hospital Stay: Payer: Medicare Other

## 2019-11-29 ENCOUNTER — Encounter: Payer: Self-pay | Admitting: Oncology

## 2019-11-29 ENCOUNTER — Inpatient Hospital Stay (HOSPITAL_BASED_OUTPATIENT_CLINIC_OR_DEPARTMENT_OTHER): Payer: Medicare Other | Admitting: Oncology

## 2019-11-29 ENCOUNTER — Inpatient Hospital Stay: Payer: Medicare Other | Attending: Oncology

## 2019-11-29 DIAGNOSIS — Z170 Estrogen receptor positive status [ER+]: Secondary | ICD-10-CM

## 2019-11-29 DIAGNOSIS — C50811 Malignant neoplasm of overlapping sites of right female breast: Secondary | ICD-10-CM

## 2019-12-01 ENCOUNTER — Telehealth: Payer: Self-pay | Admitting: Oncology

## 2019-12-01 NOTE — Telephone Encounter (Signed)
Called pt's mobile and home number per 7/12 sch msg - no answer or voicemail.

## 2019-12-06 ENCOUNTER — Telehealth: Payer: Self-pay | Admitting: Oncology

## 2019-12-06 NOTE — Telephone Encounter (Signed)
Called pt per 7/19 sch msg - no answer. Left  °message for patient to call back to reschedule appt.  ° °

## 2020-02-21 ENCOUNTER — Other Ambulatory Visit: Payer: Self-pay | Admitting: *Deleted

## 2020-02-21 ENCOUNTER — Inpatient Hospital Stay: Payer: Medicare Other

## 2020-02-21 ENCOUNTER — Other Ambulatory Visit: Payer: Self-pay

## 2020-02-21 ENCOUNTER — Inpatient Hospital Stay: Payer: Medicare Other | Attending: Oncology

## 2020-02-21 VITALS — BP 159/99 | HR 114 | Resp 22

## 2020-02-21 DIAGNOSIS — Z170 Estrogen receptor positive status [ER+]: Secondary | ICD-10-CM

## 2020-02-21 DIAGNOSIS — C50811 Malignant neoplasm of overlapping sites of right female breast: Secondary | ICD-10-CM | POA: Diagnosis not present

## 2020-02-21 DIAGNOSIS — C7951 Secondary malignant neoplasm of bone: Secondary | ICD-10-CM

## 2020-02-21 DIAGNOSIS — Z95828 Presence of other vascular implants and grafts: Secondary | ICD-10-CM

## 2020-02-21 DIAGNOSIS — G4733 Obstructive sleep apnea (adult) (pediatric): Secondary | ICD-10-CM

## 2020-02-21 DIAGNOSIS — Z79899 Other long term (current) drug therapy: Secondary | ICD-10-CM | POA: Insufficient documentation

## 2020-02-21 DIAGNOSIS — N179 Acute kidney failure, unspecified: Secondary | ICD-10-CM

## 2020-02-21 DIAGNOSIS — K7469 Other cirrhosis of liver: Secondary | ICD-10-CM

## 2020-02-21 DIAGNOSIS — E038 Other specified hypothyroidism: Secondary | ICD-10-CM

## 2020-02-21 LAB — CBC WITH DIFFERENTIAL (CANCER CENTER ONLY)
Abs Immature Granulocytes: 0.02 10*3/uL (ref 0.00–0.07)
Basophils Absolute: 0 10*3/uL (ref 0.0–0.1)
Basophils Relative: 0 %
Eosinophils Absolute: 0.1 10*3/uL (ref 0.0–0.5)
Eosinophils Relative: 1 %
HCT: 38 % (ref 36.0–46.0)
Hemoglobin: 12.7 g/dL (ref 12.0–15.0)
Immature Granulocytes: 0 %
Lymphocytes Relative: 19 %
Lymphs Abs: 1.1 10*3/uL (ref 0.7–4.0)
MCH: 34.2 pg — ABNORMAL HIGH (ref 26.0–34.0)
MCHC: 33.4 g/dL (ref 30.0–36.0)
MCV: 102.4 fL — ABNORMAL HIGH (ref 80.0–100.0)
Monocytes Absolute: 0.4 10*3/uL (ref 0.1–1.0)
Monocytes Relative: 8 %
Neutro Abs: 4 10*3/uL (ref 1.7–7.7)
Neutrophils Relative %: 72 %
Platelet Count: 130 10*3/uL — ABNORMAL LOW (ref 150–400)
RBC: 3.71 MIL/uL — ABNORMAL LOW (ref 3.87–5.11)
RDW: 13.8 % (ref 11.5–15.5)
WBC Count: 5.6 10*3/uL (ref 4.0–10.5)
nRBC: 0 % (ref 0.0–0.2)

## 2020-02-21 LAB — CMP (CANCER CENTER ONLY)
ALT: 40 U/L (ref 0–44)
AST: 66 U/L — ABNORMAL HIGH (ref 15–41)
Albumin: 2.7 g/dL — ABNORMAL LOW (ref 3.5–5.0)
Alkaline Phosphatase: 149 U/L — ABNORMAL HIGH (ref 38–126)
Anion gap: 5 (ref 5–15)
BUN: 10 mg/dL (ref 8–23)
CO2: 25 mmol/L (ref 22–32)
Calcium: 9.3 mg/dL (ref 8.9–10.3)
Chloride: 104 mmol/L (ref 98–111)
Creatinine: 0.7 mg/dL (ref 0.44–1.00)
GFR, Est AFR Am: >60 mL/min (ref >60)
GFR, Estimated: >60 mL/min (ref >60)
Glucose, Bld: 142 mg/dL — ABNORMAL HIGH (ref 70–99)
Potassium: 4.4 mmol/L (ref 3.5–5.1)
Sodium: 134 mmol/L — ABNORMAL LOW (ref 135–145)
Total Bilirubin: 0.7 mg/dL (ref 0.3–1.2)
Total Protein: 7.4 g/dL (ref 6.5–8.1)

## 2020-02-21 MED ORDER — DENOSUMAB 120 MG/1.7ML ~~LOC~~ SOLN
SUBCUTANEOUS | Status: AC
  Filled 2020-02-21: qty 1.7

## 2020-02-21 MED ORDER — DENOSUMAB 120 MG/1.7ML ~~LOC~~ SOLN
120.0000 mg | Freq: Once | SUBCUTANEOUS | Status: AC
  Administered 2020-02-21: 120 mg via SUBCUTANEOUS

## 2020-02-21 NOTE — Patient Instructions (Signed)
Denosumab injection °What is this medicine? °DENOSUMAB (den oh sue mab) slows bone breakdown. Prolia is used to treat osteoporosis in women after menopause and in men, and in people who are taking corticosteroids for 6 months or more. Xgeva is used to treat a high calcium level due to cancer and to prevent bone fractures and other bone problems caused by multiple myeloma or cancer bone metastases. Xgeva is also used to treat giant cell tumor of the bone. °This medicine may be used for other purposes; ask your health care provider or pharmacist if you have questions. °COMMON BRAND NAME(S): Prolia, XGEVA °What should I tell my health care provider before I take this medicine? °They need to know if you have any of these conditions: °· dental disease °· having surgery or tooth extraction °· infection °· kidney disease °· low levels of calcium or Vitamin D in the blood °· malnutrition °· on hemodialysis °· skin conditions or sensitivity °· thyroid or parathyroid disease °· an unusual reaction to denosumab, other medicines, foods, dyes, or preservatives °· pregnant or trying to get pregnant °· breast-feeding °How should I use this medicine? °This medicine is for injection under the skin. It is given by a health care professional in a hospital or clinic setting. °A special MedGuide will be given to you before each treatment. Be sure to read this information carefully each time. °For Prolia, talk to your pediatrician regarding the use of this medicine in children. Special care may be needed. For Xgeva, talk to your pediatrician regarding the use of this medicine in children. While this drug may be prescribed for children as young as 13 years for selected conditions, precautions do apply. °Overdosage: If you think you have taken too much of this medicine contact a poison control center or emergency room at once. °NOTE: This medicine is only for you. Do not share this medicine with others. °What if I miss a dose? °It is  important not to miss your dose. Call your doctor or health care professional if you are unable to keep an appointment. °What may interact with this medicine? °Do not take this medicine with any of the following medications: °· other medicines containing denosumab °This medicine may also interact with the following medications: °· medicines that lower your chance of fighting infection °· steroid medicines like prednisone or cortisone °This list may not describe all possible interactions. Give your health care provider a list of all the medicines, herbs, non-prescription drugs, or dietary supplements you use. Also tell them if you smoke, drink alcohol, or use illegal drugs. Some items may interact with your medicine. °What should I watch for while using this medicine? °Visit your doctor or health care professional for regular checks on your progress. Your doctor or health care professional may order blood tests and other tests to see how you are doing. °Call your doctor or health care professional for advice if you get a fever, chills or sore throat, or other symptoms of a cold or flu. Do not treat yourself. This drug may decrease your body's ability to fight infection. Try to avoid being around people who are sick. °You should make sure you get enough calcium and vitamin D while you are taking this medicine, unless your doctor tells you not to. Discuss the foods you eat and the vitamins you take with your health care professional. °See your dentist regularly. Brush and floss your teeth as directed. Before you have any dental work done, tell your dentist you are   receiving this medicine. Do not become pregnant while taking this medicine or for 5 months after stopping it. Talk with your doctor or health care professional about your birth control options while taking this medicine. Women should inform their doctor if they wish to become pregnant or think they might be pregnant. There is a potential for serious side  effects to an unborn child. Talk to your health care professional or pharmacist for more information. What side effects may I notice from receiving this medicine? Side effects that you should report to your doctor or health care professional as soon as possible:  allergic reactions like skin rash, itching or hives, swelling of the face, lips, or tongue  bone pain  breathing problems  dizziness  jaw pain, especially after dental work  redness, blistering, peeling of the skin  signs and symptoms of infection like fever or chills; cough; sore throat; pain or trouble passing urine  signs of low calcium like fast heartbeat, muscle cramps or muscle pain; pain, tingling, numbness in the hands or feet; seizures  unusual bleeding or bruising  unusually weak or tired Side effects that usually do not require medical attention (report to your doctor or health care professional if they continue or are bothersome):  constipation  diarrhea  headache  joint pain  loss of appetite  muscle pain  runny nose  tiredness  upset stomach This list may not describe all possible side effects. Call your doctor for medical advice about side effects. You may report side effects to FDA at 1-800-FDA-1088. Where should I keep my medicine? This medicine is only given in a clinic, doctor's office, or other health care setting and will not be stored at home. NOTE: This sheet is a summary. It may not cover all possible information. If you have questions about this medicine, talk to your doctor, pharmacist, or health care provider.  2020 Elsevier/Gold Standard (2017-09-12 16:10:44)

## 2020-02-22 LAB — CANCER ANTIGEN 27.29: CA 27.29: 55.2 U/mL — ABNORMAL HIGH (ref 0.0–38.6)

## 2020-03-09 ENCOUNTER — Observation Stay (HOSPITAL_COMMUNITY): Payer: Medicare Other

## 2020-03-09 ENCOUNTER — Other Ambulatory Visit: Payer: Self-pay

## 2020-03-09 ENCOUNTER — Encounter (HOSPITAL_COMMUNITY): Payer: Self-pay

## 2020-03-09 ENCOUNTER — Emergency Department (HOSPITAL_COMMUNITY): Payer: Medicare Other

## 2020-03-09 ENCOUNTER — Inpatient Hospital Stay (HOSPITAL_COMMUNITY)
Admission: EM | Admit: 2020-03-09 | Discharge: 2020-03-19 | DRG: 177 | Disposition: A | Discharge status: Home Health | Payer: Medicare Other | Attending: Family Medicine | Admitting: Family Medicine

## 2020-03-09 DIAGNOSIS — I10 Essential (primary) hypertension: Secondary | ICD-10-CM | POA: Diagnosis not present

## 2020-03-09 DIAGNOSIS — G4733 Obstructive sleep apnea (adult) (pediatric): Secondary | ICD-10-CM | POA: Diagnosis present

## 2020-03-09 DIAGNOSIS — D696 Thrombocytopenia, unspecified: Secondary | ICD-10-CM | POA: Diagnosis present

## 2020-03-09 DIAGNOSIS — R627 Adult failure to thrive: Secondary | ICD-10-CM | POA: Diagnosis not present

## 2020-03-09 DIAGNOSIS — Z170 Estrogen receptor positive status [ER+]: Secondary | ICD-10-CM

## 2020-03-09 DIAGNOSIS — R9389 Abnormal findings on diagnostic imaging of other specified body structures: Secondary | ICD-10-CM

## 2020-03-09 DIAGNOSIS — J1282 Pneumonia due to coronavirus disease 2019: Secondary | ICD-10-CM | POA: Diagnosis not present

## 2020-03-09 DIAGNOSIS — K7460 Unspecified cirrhosis of liver: Secondary | ICD-10-CM | POA: Diagnosis present

## 2020-03-09 DIAGNOSIS — Y93E1 Activity, personal bathing and showering: Secondary | ICD-10-CM

## 2020-03-09 DIAGNOSIS — E038 Other specified hypothyroidism: Secondary | ICD-10-CM | POA: Diagnosis not present

## 2020-03-09 DIAGNOSIS — E860 Dehydration: Secondary | ICD-10-CM | POA: Diagnosis present

## 2020-03-09 DIAGNOSIS — Z66 Do not resuscitate: Secondary | ICD-10-CM | POA: Diagnosis present

## 2020-03-09 DIAGNOSIS — R8281 Pyuria: Secondary | ICD-10-CM | POA: Diagnosis not present

## 2020-03-09 DIAGNOSIS — Z452 Encounter for adjustment and management of vascular access device: Secondary | ICD-10-CM

## 2020-03-09 DIAGNOSIS — Z8673 Personal history of transient ischemic attack (TIA), and cerebral infarction without residual deficits: Secondary | ICD-10-CM

## 2020-03-09 DIAGNOSIS — I6340 Cerebral infarction due to embolism of unspecified cerebral artery: Secondary | ICD-10-CM | POA: Diagnosis present

## 2020-03-09 DIAGNOSIS — Z923 Personal history of irradiation: Secondary | ICD-10-CM

## 2020-03-09 DIAGNOSIS — R1111 Vomiting without nausea: Secondary | ICD-10-CM | POA: Diagnosis not present

## 2020-03-09 DIAGNOSIS — I6389 Other cerebral infarction: Secondary | ICD-10-CM | POA: Diagnosis not present

## 2020-03-09 DIAGNOSIS — I611 Nontraumatic intracerebral hemorrhage in hemisphere, cortical: Secondary | ICD-10-CM | POA: Diagnosis not present

## 2020-03-09 DIAGNOSIS — C50811 Malignant neoplasm of overlapping sites of right female breast: Secondary | ICD-10-CM | POA: Diagnosis not present

## 2020-03-09 DIAGNOSIS — R531 Weakness: Secondary | ICD-10-CM

## 2020-03-09 DIAGNOSIS — E031 Congenital hypothyroidism without goiter: Secondary | ICD-10-CM | POA: Diagnosis not present

## 2020-03-09 DIAGNOSIS — Z9013 Acquired absence of bilateral breasts and nipples: Secondary | ICD-10-CM

## 2020-03-09 DIAGNOSIS — I639 Cerebral infarction, unspecified: Secondary | ICD-10-CM

## 2020-03-09 DIAGNOSIS — R52 Pain, unspecified: Secondary | ICD-10-CM | POA: Diagnosis not present

## 2020-03-09 DIAGNOSIS — U071 COVID-19: Principal | ICD-10-CM | POA: Diagnosis present

## 2020-03-09 DIAGNOSIS — E039 Hypothyroidism, unspecified: Secondary | ICD-10-CM | POA: Diagnosis not present

## 2020-03-09 DIAGNOSIS — Z79890 Hormone replacement therapy (postmenopausal): Secondary | ICD-10-CM

## 2020-03-09 DIAGNOSIS — R7881 Bacteremia: Secondary | ICD-10-CM

## 2020-03-09 DIAGNOSIS — C7951 Secondary malignant neoplasm of bone: Secondary | ICD-10-CM | POA: Diagnosis present

## 2020-03-09 DIAGNOSIS — S0990XA Unspecified injury of head, initial encounter: Secondary | ICD-10-CM | POA: Diagnosis not present

## 2020-03-09 DIAGNOSIS — Z9221 Personal history of antineoplastic chemotherapy: Secondary | ICD-10-CM

## 2020-03-09 DIAGNOSIS — E871 Hypo-osmolality and hyponatremia: Secondary | ICD-10-CM | POA: Diagnosis not present

## 2020-03-09 DIAGNOSIS — C50919 Malignant neoplasm of unspecified site of unspecified female breast: Secondary | ICD-10-CM | POA: Diagnosis not present

## 2020-03-09 DIAGNOSIS — C799 Secondary malignant neoplasm of unspecified site: Secondary | ICD-10-CM

## 2020-03-09 DIAGNOSIS — Z853 Personal history of malignant neoplasm of breast: Secondary | ICD-10-CM | POA: Diagnosis not present

## 2020-03-09 DIAGNOSIS — R0902 Hypoxemia: Secondary | ICD-10-CM | POA: Diagnosis not present

## 2020-03-09 DIAGNOSIS — Z7189 Other specified counseling: Secondary | ICD-10-CM | POA: Diagnosis not present

## 2020-03-09 DIAGNOSIS — A409 Streptococcal sepsis, unspecified: Secondary | ICD-10-CM | POA: Diagnosis not present

## 2020-03-09 DIAGNOSIS — Z79899 Other long term (current) drug therapy: Secondary | ICD-10-CM

## 2020-03-09 DIAGNOSIS — W182XXA Fall in (into) shower or empty bathtub, initial encounter: Secondary | ICD-10-CM | POA: Diagnosis present

## 2020-03-09 DIAGNOSIS — Z515 Encounter for palliative care: Secondary | ICD-10-CM

## 2020-03-09 DIAGNOSIS — R112 Nausea with vomiting, unspecified: Secondary | ICD-10-CM | POA: Diagnosis not present

## 2020-03-09 DIAGNOSIS — Z23 Encounter for immunization: Secondary | ICD-10-CM

## 2020-03-09 DIAGNOSIS — R509 Fever, unspecified: Secondary | ICD-10-CM

## 2020-03-09 DIAGNOSIS — I76 Septic arterial embolism: Secondary | ICD-10-CM | POA: Diagnosis present

## 2020-03-09 DIAGNOSIS — G319 Degenerative disease of nervous system, unspecified: Secondary | ICD-10-CM | POA: Diagnosis not present

## 2020-03-09 DIAGNOSIS — Z91030 Bee allergy status: Secondary | ICD-10-CM

## 2020-03-09 DIAGNOSIS — J323 Chronic sphenoidal sinusitis: Secondary | ICD-10-CM | POA: Diagnosis not present

## 2020-03-09 DIAGNOSIS — Z803 Family history of malignant neoplasm of breast: Secondary | ICD-10-CM | POA: Diagnosis not present

## 2020-03-09 DIAGNOSIS — R110 Nausea: Secondary | ICD-10-CM | POA: Diagnosis not present

## 2020-03-09 DIAGNOSIS — Z91040 Latex allergy status: Secondary | ICD-10-CM

## 2020-03-09 DIAGNOSIS — Z6841 Body Mass Index (BMI) 40.0 and over, adult: Secondary | ICD-10-CM

## 2020-03-09 DIAGNOSIS — C7931 Secondary malignant neoplasm of brain: Secondary | ICD-10-CM | POA: Diagnosis not present

## 2020-03-09 DIAGNOSIS — R109 Unspecified abdominal pain: Secondary | ICD-10-CM

## 2020-03-09 DIAGNOSIS — R197 Diarrhea, unspecified: Secondary | ICD-10-CM | POA: Diagnosis not present

## 2020-03-09 DIAGNOSIS — Z79811 Long term (current) use of aromatase inhibitors: Secondary | ICD-10-CM

## 2020-03-09 LAB — CBC WITH DIFFERENTIAL/PLATELET
Abs Immature Granulocytes: 0.07 10*3/uL (ref 0.00–0.07)
Basophils Absolute: 0 10*3/uL (ref 0.0–0.1)
Basophils Relative: 0 %
Eosinophils Absolute: 0 10*3/uL (ref 0.0–0.5)
Eosinophils Relative: 0 %
HCT: 35.1 % — ABNORMAL LOW (ref 36.0–46.0)
Hemoglobin: 11.9 g/dL — ABNORMAL LOW (ref 12.0–15.0)
Immature Granulocytes: 1 %
Lymphocytes Relative: 11 %
Lymphs Abs: 1 10*3/uL (ref 0.7–4.0)
MCH: 35.4 pg — ABNORMAL HIGH (ref 26.0–34.0)
MCHC: 33.9 g/dL (ref 30.0–36.0)
MCV: 104.5 fL — ABNORMAL HIGH (ref 80.0–100.0)
Monocytes Absolute: 0.7 10*3/uL (ref 0.1–1.0)
Monocytes Relative: 7 %
Neutro Abs: 7.7 10*3/uL (ref 1.7–7.7)
Neutrophils Relative %: 81 %
Platelets: 124 10*3/uL — ABNORMAL LOW (ref 150–400)
RBC: 3.36 MIL/uL — ABNORMAL LOW (ref 3.87–5.11)
RDW: 15 % (ref 11.5–15.5)
WBC: 9.5 10*3/uL (ref 4.0–10.5)
nRBC: 0 % (ref 0.0–0.2)

## 2020-03-09 LAB — COMPREHENSIVE METABOLIC PANEL
ALT: 33 U/L (ref 0–44)
AST: 96 U/L — ABNORMAL HIGH (ref 15–41)
Albumin: 2.5 g/dL — ABNORMAL LOW (ref 3.5–5.0)
Alkaline Phosphatase: 105 U/L (ref 38–126)
Anion gap: 9 (ref 5–15)
BUN: 10 mg/dL (ref 8–23)
CO2: 23 mmol/L (ref 22–32)
Calcium: 7.5 mg/dL — ABNORMAL LOW (ref 8.9–10.3)
Chloride: 101 mmol/L (ref 98–111)
Creatinine, Ser: 0.6 mg/dL (ref 0.44–1.00)
GFR, Estimated: >60 mL/min (ref >60)
Glucose, Bld: 150 mg/dL — ABNORMAL HIGH (ref 70–99)
Potassium: 4.1 mmol/L (ref 3.5–5.1)
Sodium: 133 mmol/L — ABNORMAL LOW (ref 135–145)
Total Bilirubin: 1.2 mg/dL (ref 0.3–1.2)
Total Protein: 6.8 g/dL (ref 6.5–8.1)

## 2020-03-09 LAB — URINALYSIS, ROUTINE W REFLEX MICROSCOPIC
Bilirubin Urine: NEGATIVE
Glucose, UA: NEGATIVE mg/dL
Hgb urine dipstick: NEGATIVE
Ketones, ur: 5 mg/dL — AB
Nitrite: NEGATIVE
Protein, ur: 30 mg/dL — AB
Specific Gravity, Urine: 1.02 (ref 1.005–1.030)
pH: 6 (ref 5.0–8.0)

## 2020-03-09 LAB — RESPIRATORY PANEL BY RT PCR (FLU A&B, COVID)
Influenza A by PCR: NEGATIVE
Influenza B by PCR: NEGATIVE
SARS Coronavirus 2 by RT PCR: POSITIVE — AB

## 2020-03-09 LAB — CBG MONITORING, ED: Glucose-Capillary: 148 mg/dL — ABNORMAL HIGH (ref 70–99)

## 2020-03-09 MED ORDER — ONDANSETRON HCL 4 MG PO TABS: 4.0000 mg | ORAL_TABLET | Freq: Four times a day (QID) | ORAL | Status: DC | PRN

## 2020-03-09 MED ORDER — ENOXAPARIN SODIUM 40 MG/0.4ML ~~LOC~~ SOLN
40.0000 mg | SUBCUTANEOUS | Status: DC
  Administered 2020-03-09: 40 mg via SUBCUTANEOUS
  Filled 2020-03-09: qty 0.4

## 2020-03-09 MED ORDER — ONDANSETRON HCL 4 MG/2ML IJ SOLN
4.0000 mg | Freq: Once | INTRAMUSCULAR | Status: AC
  Administered 2020-03-09: 4 mg via INTRAVENOUS
  Filled 2020-03-09: qty 2

## 2020-03-09 MED ORDER — SODIUM CHLORIDE 0.9 % IV BOLUS
500.0000 mL | Freq: Once | INTRAVENOUS | Status: AC
  Administered 2020-03-09: 500 mL via INTRAVENOUS

## 2020-03-09 MED ORDER — ANASTROZOLE 1 MG PO TABS
1.0000 mg | ORAL_TABLET | Freq: Every day | ORAL | Status: DC
  Administered 2020-03-09 – 2020-03-19 (×10): 1 mg via ORAL
  Filled 2020-03-09 (×10): qty 1

## 2020-03-09 MED ORDER — LEVOTHYROXINE SODIUM 25 MCG PO TABS
25.0000 ug | ORAL_TABLET | Freq: Every day | ORAL | Status: DC
  Administered 2020-03-10 – 2020-03-19 (×8): 25 ug via ORAL
  Filled 2020-03-09 (×10): qty 1

## 2020-03-09 MED ORDER — ONDANSETRON HCL 4 MG/2ML IJ SOLN: 4.0000 mg | Freq: Four times a day (QID) | INTRAMUSCULAR | Status: DC | PRN

## 2020-03-09 MED ORDER — ACETAMINOPHEN 325 MG PO TABS
650.0000 mg | ORAL_TABLET | Freq: Four times a day (QID) | ORAL | Status: DC | PRN
  Administered 2020-03-10 – 2020-03-15 (×6): 650 mg via ORAL
  Filled 2020-03-09 (×6): qty 2

## 2020-03-09 MED ORDER — PANTOPRAZOLE SODIUM 40 MG PO TBEC
40.0000 mg | DELAYED_RELEASE_TABLET | Freq: Every day | ORAL | Status: DC
  Administered 2020-03-09 – 2020-03-18 (×10): 40 mg via ORAL
  Filled 2020-03-09 (×11): qty 1

## 2020-03-09 MED ORDER — ACETAMINOPHEN 650 MG RE SUPP: 650.0000 mg | Freq: Four times a day (QID) | RECTAL | Status: DC | PRN

## 2020-03-09 MED ORDER — DEXTROSE IN LACTATED RINGERS 5 % IV SOLN
INTRAVENOUS | Status: DC
  Filled 2020-03-09: qty 1000

## 2020-03-09 NOTE — ED Notes (Signed)
Dr.Kakrakandy notified of pts positive status and order for admission changed to represent COVID positive status.

## 2020-03-09 NOTE — H&P (Addendum)
History and Physical    Jasmine Hood PNT:[id number] DOB: 06/12/1945 DOA: 03/09/2020  PCP: [name], [name], MD   Patient coming from: Home   Chief Complaint: generalized weakness.   HPI: Jasmine Hood is a 74 y.o. female with medical history significant of metastatic breast cancer, with metastasis to the thoracic spine. Patient was brought to the hospital due to generalized weakness. She lives with her daughter, over the last approximately 5 to 10 days she has been developing progressive generalized weakness. About 2 days ago she got out of the bathroom after having a shower, on her way to her bedroom she collapsed on the floor. No head trauma or loss of consciousness. She required assistance to get back to her bedroom but she was too weak to stand up, per her report she stayed on the floor for about 48 hours. Today her daughter called EMS and she was transported to the hospital.  She complains of poor appetite, generalized malaise but not fevers or chills. Positive nausea, vomiting and diarrhea.  Denies any sick contacts.  She described her weakness as generalized, persistent, no improving factors, worse with exertion.   ED Course: Patient had extensive work-up, CT of the head suspected for brain metastasis. Patient was referred for admission for evaluation. Consider unsafe discharge home.  Review of Systems:  1. General: No fevers, no chills, no weight gain or weight loss 2. ENT: No runny nose or sore throat, no hearing disturbances 3. Pulmonary: No dyspnea, cough, wheezing, or hemoptysis 4. Cardiovascular: No angina, claudication, lower extremity edema, pnd or orthopnea 5. Gastrointestinal: positive nausea, vomiting, and diarrhea.   6. Hematology: No easy bruisability or frequent infections 7. Urology: No dysuria, hematuria or increased urinary frequency 8. Dermatology: No rashes. 9. Neurology: No seizures or paresthesias 10. Musculoskeletal: No joint pain or  deformities  Past Medical History:  Diagnosis Date  . Breast cancer ([place]) 10/07/2016   right breast, spine  . Brown recluse spider bite    right leg  . COLONIC POLYPS, HX OF 01/05/2009   Qualifier: Diagnosis of  By: [name]  MD, [name]   . Complication of anesthesia    slow to awaken after wisdom  teeth extraction age 2  . Constipation   . Genetic testing 11/27/2016   [name] underwent genetic counseling and testing for hereditary cancer syndromes on 11/19/2016. Her results were negative for mutations in all 46 genes analyzed by [facility]'s 46-gene Common Hereditary Cancers Panel. Genes analyzed include: APC, ATM, AXIN2, BARD1, BMPR1A, BRCA1, BRCA2, BRIP1, CDH1, CDKN2A, CHEK2, CTNNA1, DICER1, EPCAM, GREM1, HOXB13, KIT, MEN1, MLH1, MSH2, MSH3, MSH6, MUTYH, NBN,  . Hypothyroidism   . Morbid obesity ([place]) 01/05/2009   Qualifier: Diagnosis of  By: [name]  MD, [name]   . Pneumonia 1987  . Sleep apnea    no cpap yet, has not recieved results from home test yet  . Stroke [facility])    found on CT scan, no deficits    Past Surgical History:  Procedure Laterality Date  . BILATERAL TOTAL MASTECTOMY WITH AXILLARY LYMPH NODE DISSECTION Bilateral 04/04/2017   Procedure: RIGHT MASTECTOMY WITH TARGETED RIGHT AXILLARY NODE DISSECTION AND LEFT PROPHYLATIC MASTECTOMY ERAS PATHWAY;  Surgeon: Alphonsa Overall, MD;  Location: [place];  Service: General;  Laterality: Bilateral;  PEC BLOCK  . BREAST BIOPSY Right 10/07/2016   invasive mammary carcinoma  . BREAST BIOPSY Left 10/08/2016   breast fibroadenoma no malignancy  . BREAST REDUCTION SURGERY Bilateral 10/28/2018   Procedure:  Excision of bilateral excess breast tissue;  Surgeon: [name], DO;  Location: [place];  Service: Plastics;  Laterality: Bilateral;  2 hours, please.  Marland Kitchen CATARACT EXTRACTION Bilateral   . COLONOSCOPY W/ BIOPSIES AND POLYPECTOMY  2012  . COLONOSCOPY WITH PROPOFOL N/A 12/22/2015   Procedure:  COLONOSCOPY WITH PROPOFOL;  Surgeon: [name], MD;  Location: [facility];  Service: Endoscopy;  Laterality: N/A;  . INCISION AND DRAINAGE Right 1998   surgery for spider bite on RLE; "[name]"  . MASTECTOMY Left 04/04/2017   [name]  . MASTECTOMY WITH AXILLARY LYMPH NODE DISSECTION Right 04/04/2017  . MOLE REMOVAL  10/29/2016   Procedure: MOLE REMOVAL LEFT CHEST;  Surgeon: Alphonsa Overall, MD;  Location: [facility];  Service: General;;  . PORT-A-CATH REMOVAL Left 08/05/2018   Procedure: REMOVAL PORT-A-CATH;  Surgeon: [name], DO;  Location: [place];  Service: Plastics;  Laterality: Left;  . PORTACATH PLACEMENT N/A 10/29/2016   Procedure: INSERTION PORT-A-CATH;  Surgeon: Alphonsa Overall, MD;  Location: [facility];  Service: General;  Laterality: N/A;  . TUBAL LIGATION    . WISDOM TOOTH EXTRACTION  age 79     reports that she has never smoked. She has never used smokeless tobacco. She reports that she does not drink alcohol and does not use drugs.  Allergies  Allergen Reactions  . Bee Venom Anaphylaxis  . Latex Swelling    Family History  Problem Relation Age of Onset  . Breast cancer Mother 66       d.52 from metastatic disease  . Throat cancer Father        d.65 history of smoking  . Rheum arthritis Sister        3 sisters pos. for osteo and RA  . Diabetes Maternal Aunt   . Cancer Maternal Grandfather        mouth cancer-chewed tobacco  . Breast cancer Cousin 68  . Breast cancer Daughter 40  . Stomach cancer Sister 19     Prior to Admission medications   Medication Sig Start Date End Date Taking? Authorizing Provider  anastrozole (ARIMIDEX) 1 MG tablet Take 1 tablet (1 mg total) by mouth daily. 06/14/19   Magrinat, Virgie Dad, MD  calcium carbonate (TUMS) 500 MG chewable tablet Chew 1 tablet (200 mg of elemental calcium total) by mouth 2 (two) times daily. Patient not taking: Reported on 11/10/2019 05/21/18   Magrinat, Virgie Dad, MD    levothyroxine (SYNTHROID) 25 MCG tablet Take 1 tablet (25 mcg total) by mouth daily at 6 (six) AM. 11/13/19 12/13/19  Harold Hedge, MD    Physical Exam: Vitals:   03/09/20 1212 03/09/20 1230 03/09/20 1300 03/09/20 1500  BP: 116/66 120/73 120/76 123/68  Pulse: 92 87 82 77  Resp: (!) 22 (!) 22 (!) 23 (!) 27  Temp: 98.4 F (36.9 C)     TempSrc: Oral     SpO2: 98% 97% 97% 91%  Weight:      Height:        Vitals:   03/09/20 1212 03/09/20 1230 03/09/20 1300 03/09/20 1500  BP: 116/66 120/73 120/76 123/68  Pulse: 92 87 82 77  Resp: (!) 22 (!) 22 (!) 23 (!) 27  Temp: 98.4 F (36.9 C)     TempSrc: Oral     SpO2: 98% 97% 97% 91%  Weight:      Height:       General: deconditioned and ill  looking appearing  Neurology: Awake and alert, non focal Head and Neck. Head normocephalic. Neck supple with no adenopathy or thyromegaly.   E ENT: mild pallor, no icterus, oral mucosa dry. Cardiovascular: No JVD. S1-S2 present, rhythmic, no gallops, rubs, or murmurs.  Trace non pitting lower extremity edema. Pulmonary: positive breath sounds bilaterally, with no wheezing or rales. Scattered rhonchi.Poor inspiratory effort.  Gastrointestinal. Abdomen protuberant, soft and non tender.  Skin. No rashes Musculoskeletal: no joint deformities    Labs on Admission: I have personally reviewed following labs and imaging studies  CBC: Recent Labs  Lab 03/09/20 1245  WBC 9.5  NEUTROABS 7.7  HGB 11.9*  HCT 35.1*  MCV 104.5*  PLT 829*   Basic Metabolic Panel: Recent Labs  Lab 03/09/20 1245  NA 133*  K 4.1  CL 101  CO2 23  GLUCOSE 150*  BUN 10  CREATININE 0.60  CALCIUM 7.5*   GFR: Estimated Creatinine Clearance: 63.2 mL/min (by C-G formula based on SCr of 0.6 mg/dL). Liver Function Tests: Recent Labs  Lab 03/09/20 1245  AST 96*  ALT 33  ALKPHOS 105  BILITOT 1.2  PROT 6.8  ALBUMIN 2.5*   No results for input(s): LIPASE, AMYLASE in the last 168 hours. No results for input(s):  AMMONIA in the last 168 hours. Coagulation Profile: No results for input(s): INR, PROTIME in the last 168 hours. Cardiac Enzymes: No results for input(s): CKTOTAL, CKMB, CKMBINDEX, TROPONINI in the last 168 hours. BNP (last 3 results) No results for input(s): PROBNP in the last 8760 hours. HbA1C: No results for input(s): HGBA1C in the last 72 hours. CBG: Recent Labs  Lab 03/09/20 1206  GLUCAP 148*   Lipid Profile: No results for input(s): CHOL, HDL, LDLCALC, TRIG, CHOLHDL, LDLDIRECT in the last 72 hours. Thyroid Function Tests: No results for input(s): TSH, T4TOTAL, FREET4, T3FREE, THYROIDAB in the last 72 hours. Anemia Panel: No results for input(s): VITAMINB12, FOLATE, FERRITIN, TIBC, IRON, RETICCTPCT in the last 72 hours. Urine analysis:    Component Value Date/Time   COLORURINE YELLOW 11/11/2019 0300   APPEARANCEUR CLEAR 11/11/2019 0300   LABSPEC 1.015 11/11/2019 0300   PHURINE 5.0 11/11/2019 0300   GLUCOSEU NEGATIVE 11/11/2019 0300   GLUCOSEU NEGATIVE 01/25/2010 0858   HGBUR NEGATIVE 11/11/2019 0300   HGBUR 1+ 12/30/2008 0838   BILIRUBINUR NEGATIVE 11/11/2019 0300   BILIRUBINUR Neg 04/28/2015 1122   KETONESUR NEGATIVE 11/11/2019 0300   PROTEINUR NEGATIVE 11/11/2019 0300   UROBILINOGEN 0.2 04/28/2015 1122   UROBILINOGEN 0.2 01/25/2010 0858   NITRITE NEGATIVE 11/11/2019 0300   LEUKOCYTESUR TRACE (A) 11/11/2019 0300    Radiological Exams on Admission: CT Head Wo Contrast  Result Date: 03/09/2020 CLINICAL DATA:  Head trauma, minor. Additional history provided: Patient found down, stage IV breast cancer with known metastases. EXAM: CT HEAD WITHOUT CONTRAST TECHNIQUE: Contiguous axial images were obtained from the base of the skull through the vertex without intravenous contrast. COMPARISON:  Brain MRI 09/27/2017.  Head CT 02/19/2017. FINDINGS: Brain: Mild generalized cerebral atrophy. There is a 5 mm nodular hyperdensity focus measuring 60 Hounsfield units within the  posterior right temporal lobe (series 5, image 43) (series 6, image 11). No demarcated cortical infarct. No extra-axial fluid collection. No midline shift. Partially empty sella turcica. Vascular: No hyperdense vessel. Skull: Multiple known calvarial metastases were better appreciated on the prior brain MRI of 09/27/2017. Sinuses/Orbits: Visualized orbits show no acute finding. Frothy secretions within the right sphenoid sinus. No significant mastoid effusion. These results were called  by telephone at the time of interpretation on 03/09/2020 at 2:57 pm to provider [place] , who verbally acknowledged these results. IMPRESSION: 5 mm nodular hyperdensity within the posterior right temporal as described. This may reflect a hyperdense metastasis. A small acute parenchymal hemorrhage cannot be excluded. Mild generalized cerebral atrophy. Multiple known calvarial metastases were better appreciated on the prior brain MRI of 09/27/2017. Right sphenoid sinusitis. Electronically Signed   By: Kellie Simmering DO   On: 03/09/2020 14:58   DG Abdomen Acute W/Chest  Result Date: 03/09/2020 CLINICAL DATA:  Metastatic breast cancer.  Weakness. EXAM: DG ABDOMEN ACUTE WITH 1 VIEW CHEST COMPARISON:  Chest radiograph 11/10/2019. Abdominal radiographs 11/24/2008. FINDINGS: The cardiomediastinal silhouette is unchanged. Asymmetric opacity is again noted in the right upper lobe consistent with post radiation changes. The interstitial markings are mildly prominent bilaterally without overt edema. No acute airspace consolidation, sizeable pleural effusion, pneumothorax is identified. No acute osseous abnormality is seen. There is no evidence of intraperitoneal free air. Gas is present in nondilated colon. No dilated loops of bowel are seen to suggest obstruction. IMPRESSION: Nonobstructed bowel gas pattern. Chronic lung changes without evidence of acute cardiopulmonary disease. Electronically Signed   By: Logan Bores M.D.   On:  03/09/2020 14:32    EKG: Independently reviewed. NA  Assessment/Plan Principal Problem:   Weakness Active Problems:   Hypothyroidism   Essential hypertension   OSA (obstructive sleep apnea)   Malignant neoplasm of overlapping sites of right breast in female, estrogen receptor positive ([place])   Bone metastases ([place])   74 year old female with a past medical history for metastatic breast cancer to the thoracic spine, who presents with progressive generalized weakness to the point where she is not able to ambulate. She reports poor oral intake, generalized malaise and fatigue. She collapsed to the floor about 2 days ago, unable to stand back on her feet. On her initial physical examination blood pressure 123/68, heart rate 77, respiratory rate 27, oxygen saturation 91%, mild pallor, dry mucous membranes, lungs with scattered rhonchi no rales or wheezing, abdomen protuberant, soft, trace nonpitting lower extremity edema. Sodium 133, potassium 4.1, chloride 101, bicarb 23, glucose 150, BUN 10, creatinine 0.60, calcium 7.5, albumin 2.5, AST 96, ALT 33, white count 9.5, hemoglobin 11.9, hematocrit 35.1, platelets 124. SARS COVID-19 pending. Urinalysis specific gravity 1.020, 0-5 red cells, 11-20 white cells. Head CT with a 5 mm nodular hyperdensity within the posterior right temporal lobe. May reflect metastasis, small acute parenchymal hemorrhage cannot be excluded.  Multiple known calvarial metastasis.  Chest radiograph with increased lung markings bilaterally.  Jasmine Hood will be admitted to the hospital with a working diagnosis of failure to thrive in the setting of suspected new brain metastasis from breast cancer.   1. Breast cancer with thoracic spine and calvarial metastasis, bony metastasis complicated with suspected new brain metastases. Admit patient to the medical ward, neurochecks every 4 hours, physical therapy, occupational therapy and nutrition evaluation. Further work-up with  brain MRI. Add the oncology team to the care team, Dr. [name] is his oncologist.  For now continue anastrozole.  2. Clinical dehydration/hyponatremia.. Patient with very poor oral intake, she does have a preserved kidney function. No hypercalcemia. Hydration with dextrose and balance electrolyte solutions, D5LR at 75 ml per H.  3. Hypothyroid. Check TSH, continue levothyroxine.  4. Hypertension. Blood pressure is well controlled, patient not on antihypertensive agents.  5. Obesity class III, OSA. Calculated BMI 45.6, resume home CPAP.  Status is: Observation  The patient remains OBS appropriate and will d/c before 2 midnights.  Dispo: The patient is from: Home              Anticipated d/c is to: Home              Anticipated d/c date is: 1 day              Patient currently is not medically stable to d/c. Currently unsafe discharge.   DVT prophylaxis: Enoxaparin   Code Status:   dnr   Family Communication:   I talked to her friend, contact number in the chart.   Consults called:   none (added oncology team to care team)   Admission status:  Observation.    Joeli Fenner Gerome Apley MD [facility]   03/09/2020, 3:35 PM

## 2020-03-09 NOTE — ED Triage Notes (Signed)
Pt ambulated to restroom last night and crawled back to bedroom due to be weak. When family came in to check on her she was laying in feces/vomit. stg 4 breast ca with mets to back/brain. In process of getting hospice. Per EMS DNR at home.

## 2020-03-09 NOTE — ED Provider Notes (Signed)
[facility] DEPT Provider Note   CSN: [id number] Arrival date & time: 03/09/20  1155     History Chief Complaint  Patient presents with  . Emesis    Jasmine Hood is a 74 y.o. female.  HPI Patient reportedly brought in for being weak.  Patient states she just fell after taking a shower.  Per EMS note patient called back from the bathroom.  Reportedly was laying in feces and vomit.  Has history of breast cancer.  Reportedly has cancer "everywhere.  States that Dr. Charlestine Night is her cancer doctor.  There has been talk of hospice per EMS and patient is DNR.  However patient states she is not on hospice yet because she is not ready for that yet.  Denies abdominal pain.  States she just feels weak all over.    Past Medical History:  Diagnosis Date  . Breast cancer ([place]) 10/07/2016   right breast, spine  . Brown recluse spider bite    right leg  . COLONIC POLYPS, HX OF 01/05/2009   Qualifier: Diagnosis of  By: [name]  MD, [name]   . Complication of anesthesia    slow to awaken after wisdom  teeth extraction age 22  . Constipation   . Genetic testing 11/27/2016   Ms. Schnelle underwent genetic counseling and testing for hereditary cancer syndromes on 11/19/2016. Her results were negative for mutations in all 46 genes analyzed by [employer]'s 46-gene Common Hereditary Cancers Panel. Genes analyzed include: APC, ATM, AXIN2, BARD1, BMPR1A, BRCA1, BRCA2, BRIP1, CDH1, CDKN2A, CHEK2, CTNNA1, DICER1, EPCAM, GREM1, HOXB13, KIT, MEN1, MLH1, MSH2, MSH3, MSH6, MUTYH, NBN,  . Hypothyroidism   . Morbid obesity ([place]) 01/05/2009   Qualifier: Diagnosis of  By: [name]  MD, [name]   . Pneumonia 1987  . Sleep apnea    no cpap yet, has not recieved results from home test yet  . Stroke [facility])    found on CT scan, no deficits    Patient Active Problem List   Diagnosis Date Noted  . Sepsis ([place]) 11/10/2019  . Changing skin lesion 11/07/2018  . History of right breast  cancer 06/05/2018  . Acquired absence of breast and nipple, bilateral 06/05/2018  . Cellulitis 06/09/2017  . Acute lower UTI 02/20/2017  . Elevated lactic acid level   . Urinary tract infection without hematuria   . Weakness   . Generalized weakness 02/19/2017  . AKI (acute kidney injury) ([place]) 02/12/2017  . Dehydration 02/12/2017  . Abnormal TSH 02/12/2017  . Genetic testing 11/27/2016  . Port catheter in place 11/14/2016  . Bone metastases ([place]) 11/11/2016  . Morbid obesity with BMI of 50.0-59.9, adult ([place]) 11/05/2016  . Transaminitis 11/05/2016  . Malignant neoplasm of overlapping sites of right breast in female, estrogen receptor positive ([place]) 10/21/2016  . Encounter for follow-up surveillance of colon cancer   . Benign neoplasm of ascending colon   . Benign neoplasm of descending colon   . Benign neoplasm of transverse colon   . Benign neoplasm of cecum   . OSA (obstructive sleep apnea) 09/14/2012  . Contact dermatitis and eczema due to plant 12/31/2011  . Hypothyroidism 02/01/2010  . Essential hypertension 01/05/2009  . History of colonic polyps 01/05/2009    Past Surgical History:  Procedure Laterality Date  . BILATERAL TOTAL MASTECTOMY WITH AXILLARY LYMPH NODE DISSECTION Bilateral 04/04/2017   Procedure: RIGHT MASTECTOMY WITH TARGETED RIGHT AXILLARY NODE DISSECTION AND LEFT PROPHYLATIC MASTECTOMY ERAS PATHWAY;  Surgeon: Alphonsa Overall, MD;  Location: [facility] OR;  Service: General;  Laterality: Bilateral;  PEC BLOCK  . BREAST BIOPSY Right 10/07/2016   invasive mammary carcinoma  . BREAST BIOPSY Left 10/08/2016   breast fibroadenoma no malignancy  . BREAST REDUCTION SURGERY Bilateral 10/28/2018   Procedure: Excision of bilateral excess breast tissue;  Surgeon: [name], DO;  Location: [facility];  Service: Plastics;  Laterality: Bilateral;  2 hours, please.  Marland Kitchen CATARACT EXTRACTION Bilateral   . COLONOSCOPY W/ BIOPSIES AND POLYPECTOMY  2012  .  COLONOSCOPY WITH PROPOFOL N/A 12/22/2015   Procedure: COLONOSCOPY WITH PROPOFOL;  Surgeon: [name], MD;  Location: [facility];  Service: Endoscopy;  Laterality: N/A;  . INCISION AND DRAINAGE Right 1998   surgery for spider bite on RLE; "[name]"  . MASTECTOMY Left 04/04/2017   [name]  . MASTECTOMY WITH AXILLARY LYMPH NODE DISSECTION Right 04/04/2017  . MOLE REMOVAL  10/29/2016   Procedure: MOLE REMOVAL LEFT CHEST;  Surgeon: Alphonsa Overall, MD;  Location: [facility];  Service: General;;  . PORT-A-CATH REMOVAL Left 08/05/2018   Procedure: REMOVAL PORT-A-CATH;  Surgeon: [name], DO;  Location: [facility];  Service: Plastics;  Laterality: Left;  . PORTACATH PLACEMENT N/A 10/29/2016   Procedure: INSERTION PORT-A-CATH;  Surgeon: Alphonsa Overall, MD;  Location: [facility];  Service: General;  Laterality: N/A;  . TUBAL LIGATION    . WISDOM TOOTH EXTRACTION  age 89     OB History   No obstetric history on file.     Family History  Problem Relation Age of Onset  . Breast cancer Mother 26       d.52 from metastatic disease  . Throat cancer Father        d.65 history of smoking  . Rheum arthritis Sister        3 sisters pos. for osteo and RA  . Diabetes Maternal Aunt   . Cancer Maternal Grandfather        mouth cancer-chewed tobacco  . Breast cancer Cousin 33  . Breast cancer Daughter 43  . Stomach cancer Sister 96    Social History   Tobacco Use  . Smoking status: Never Smoker  . Smokeless tobacco: Never Used  Vaping Use  . Vaping Use: Never used  Substance Use Topics  . Alcohol use: No  . Drug use: No    Home Medications Prior to Admission medications   Medication Sig Start Date End Date Taking? Authorizing Provider  anastrozole (ARIMIDEX) 1 MG tablet Take 1 tablet (1 mg total) by mouth daily. 06/14/19   Magrinat, Virgie Dad, MD  calcium carbonate (TUMS) 500 MG chewable tablet Chew 1 tablet (200 mg of elemental calcium total) by mouth 2  (two) times daily. Patient not taking: Reported on 11/10/2019 05/21/18   Magrinat, Virgie Dad, MD  levothyroxine (SYNTHROID) 25 MCG tablet Take 1 tablet (25 mcg total) by mouth daily at 6 (six) AM. 11/13/19 12/13/19  Harold Hedge, MD    Allergies    Bee venom and Latex  Review of Systems   Review of Systems  Constitutional: Positive for appetite change and fatigue.  HENT: Negative for congestion.   Respiratory: Positive for shortness of breath.   Cardiovascular: Negative for chest pain.  Gastrointestinal: Positive for abdominal pain.  Musculoskeletal: Positive for myalgias.  Skin: Negative for rash.  Neurological: Positive for weakness. Negative for headaches.  Psychiatric/Behavioral: Negative for confusion.    Physical Exam Updated Vital Signs BP  123/68   Pulse 77   Temp 98.4 F (36.9 C) (Oral)   Resp (!) 27   Ht _0  (1.499 m)   Wt 102.5 kg   SpO2 91%   BMI 45.65 kg/m   Physical Exam Vitals and nursing note reviewed.  HENT:     Head: Atraumatic.  Cardiovascular:     Rate and Rhythm: Regular rhythm.  Pulmonary:     Breath sounds: No wheezing or rhonchi.  Abdominal:     Tenderness: There is abdominal tenderness.     Comments: Tenderness to mid abdomen with some fullness.  When I asked patient if she has cancer in the abdomen she states it is "everywhere".  Musculoskeletal:        General: No tenderness.     Cervical back: Neck supple.  Skin:    General: Skin is warm.  Neurological:     Mental Status: She is alert.     Comments: Patient is awake and answers questions although mildly slow to answer.  Moves all extremities.     ED Results / Procedures / Treatments   Labs (all labs ordered are listed, but only abnormal results are displayed) Labs Reviewed  COMPREHENSIVE METABOLIC PANEL - Abnormal; Notable for the following components:      Result Value   Sodium 133 (*)    Glucose, Bld 150 (*)    Calcium 7.5 (*)    Albumin 2.5 (*)    AST 96 (*)    All other  components within normal limits  CBC WITH DIFFERENTIAL/PLATELET - Abnormal; Notable for the following components:   RBC 3.36 (*)    Hemoglobin 11.9 (*)    HCT 35.1 (*)    MCV 104.5 (*)    MCH 35.4 (*)    Platelets 124 (*)    All other components within normal limits  CBG MONITORING, ED - Abnormal; Notable for the following components:   Glucose-Capillary 148 (*)    All other components within normal limits  URINALYSIS, ROUTINE W REFLEX MICROSCOPIC    EKG None  Radiology CT Head Wo Contrast  Result Date: 03/09/2020 CLINICAL DATA:  Head trauma, minor. Additional history provided: Patient found down, stage IV breast cancer with known metastases. EXAM: CT HEAD WITHOUT CONTRAST TECHNIQUE: Contiguous axial images were obtained from the base of the skull through the vertex without intravenous contrast. COMPARISON:  Brain MRI 09/27/2017.  Head CT 02/19/2017. FINDINGS: Brain: Mild generalized cerebral atrophy. There is a 5 mm nodular hyperdensity focus measuring 60 Hounsfield units within the posterior right temporal lobe (series 5, image 43) (series 6, image 11). No demarcated cortical infarct. No extra-axial fluid collection. No midline shift. Partially empty sella turcica. Vascular: No hyperdense vessel. Skull: Multiple known calvarial metastases were better appreciated on the prior brain MRI of 09/27/2017. Sinuses/Orbits: Visualized orbits show no acute finding. Frothy secretions within the right sphenoid sinus. No significant mastoid effusion. These results were called by telephone at the time of interpretation on 03/09/2020 at 2:57 pm to provider [facility] , who verbally acknowledged these results. IMPRESSION: 5 mm nodular hyperdensity within the posterior right temporal as described. This may reflect a hyperdense metastasis. A small acute parenchymal hemorrhage cannot be excluded. Mild generalized cerebral atrophy. Multiple known calvarial metastases were better appreciated on the prior  brain MRI of 09/27/2017. Right sphenoid sinusitis. Electronically Signed   By: Kellie Simmering DO   On: 03/09/2020 14:58   DG Abdomen Acute W/Chest  Result Date: 03/09/2020 CLINICAL DATA:  Metastatic breast cancer.  Weakness. EXAM: DG ABDOMEN ACUTE WITH 1 VIEW CHEST COMPARISON:  Chest radiograph 11/10/2019. Abdominal radiographs 11/24/2008. FINDINGS: The cardiomediastinal silhouette is unchanged. Asymmetric opacity is again noted in the right upper lobe consistent with post radiation changes. The interstitial markings are mildly prominent bilaterally without overt edema. No acute airspace consolidation, sizeable pleural effusion, pneumothorax is identified. No acute osseous abnormality is seen. There is no evidence of intraperitoneal free air. Gas is present in nondilated colon. No dilated loops of bowel are seen to suggest obstruction. IMPRESSION: Nonobstructed bowel gas pattern. Chronic lung changes without evidence of acute cardiopulmonary disease. Electronically Signed   By: Logan Bores M.D.   On: 03/09/2020 14:32    Procedures Procedures (including critical care time)  Medications Ordered in ED Medications  sodium chloride 0.9 % bolus 500 mL (500 mLs Intravenous New Bag/Given 03/09/20 1244)  ondansetron (ZOFRAN) injection 4 mg (4 mg Intravenous Given 03/09/20 1251)    ED Course  I have reviewed the triage vital signs and the nursing notes.  Pertinent labs & imaging results that were available during my care of the patient were reviewed by me and considered in my medical decision making (see chart for details).    MDM Rules/Calculators/A&P                         Initial differential diagnosis includes weakness dehydration metastatic cancer metabolic encephalopathy trauma. Patient with generalized weakness.  Reportedly had a fall.  4 days had nausea vomiting.  States that Dr. Charlestine Night is managing her cancer.  Although he does not appear to be an oncologist.  Even in the notes is not her  primary care.  May have some confusion.  States she has metastatic cancer everywhere.  Note stated that has cancer metastatic to brain but most recent imaging I can see only had 2 skull.  CT scan today shows likely brain mets.  On known if this is new or not.  However with mental status change weakness I feels a patient would benefit from admission to the hospital to further delineate the disease the weakness and goals of care.  There has been some mention of hospice.  But patient states she was not ready.  Attempted to call Georga Bora at [phone] who is reportedly patient contact.  No answer at the number.  Will discuss with hospitalist.  I reviewed imaging and lab work. Final Clinical Impression(s) / ED Diagnoses Final diagnoses:  Weakness  Metastatic malignant neoplasm, unspecified site [facility])    Rx / [place] Orders ED Discharge Orders    None       Davonna Belling, MD 03/09/20 [phone]

## 2020-03-09 NOTE — ED Notes (Signed)
Pt placed on purewick 

## 2020-03-09 NOTE — ED Notes (Signed)
Georga Bora called and left number incase we needed it for patient [phone]

## 2020-03-09 NOTE — ED Notes (Signed)
Pt was placed on a [facility] floor that is not equipped for COVID positive patients. Bed Placement was notified and floor coverage was paged to fix order due to pt being COVID positive.

## 2020-03-10 ENCOUNTER — Observation Stay (HOSPITAL_COMMUNITY): Payer: Medicare Other

## 2020-03-10 ENCOUNTER — Inpatient Hospital Stay (HOSPITAL_COMMUNITY): Payer: Medicare Other

## 2020-03-10 ENCOUNTER — Observation Stay (HOSPITAL_BASED_OUTPATIENT_CLINIC_OR_DEPARTMENT_OTHER): Payer: Medicare Other

## 2020-03-10 ENCOUNTER — Encounter (HOSPITAL_COMMUNITY): Payer: Self-pay | Admitting: Family Medicine

## 2020-03-10 DIAGNOSIS — G4733 Obstructive sleep apnea (adult) (pediatric): Secondary | ICD-10-CM | POA: Diagnosis not present

## 2020-03-10 DIAGNOSIS — Z9013 Acquired absence of bilateral breasts and nipples: Secondary | ICD-10-CM | POA: Diagnosis not present

## 2020-03-10 DIAGNOSIS — I119 Hypertensive heart disease without heart failure: Secondary | ICD-10-CM | POA: Diagnosis not present

## 2020-03-10 DIAGNOSIS — Z7189 Other specified counseling: Secondary | ICD-10-CM | POA: Diagnosis not present

## 2020-03-10 DIAGNOSIS — R627 Adult failure to thrive: Secondary | ICD-10-CM | POA: Diagnosis present

## 2020-03-10 DIAGNOSIS — Z86010 Personal history of colonic polyps: Secondary | ICD-10-CM | POA: Diagnosis not present

## 2020-03-10 DIAGNOSIS — R531 Weakness: Secondary | ICD-10-CM

## 2020-03-10 DIAGNOSIS — A419 Sepsis, unspecified organism: Secondary | ICD-10-CM | POA: Diagnosis not present

## 2020-03-10 DIAGNOSIS — Y93E1 Activity, personal bathing and showering: Secondary | ICD-10-CM | POA: Diagnosis not present

## 2020-03-10 DIAGNOSIS — R0902 Hypoxemia: Secondary | ICD-10-CM | POA: Diagnosis not present

## 2020-03-10 DIAGNOSIS — R109 Unspecified abdominal pain: Secondary | ICD-10-CM | POA: Diagnosis not present

## 2020-03-10 DIAGNOSIS — D630 Anemia in neoplastic disease: Secondary | ICD-10-CM | POA: Diagnosis not present

## 2020-03-10 DIAGNOSIS — E038 Other specified hypothyroidism: Secondary | ICD-10-CM | POA: Diagnosis not present

## 2020-03-10 DIAGNOSIS — I6340 Cerebral infarction due to embolism of unspecified cerebral artery: Secondary | ICD-10-CM | POA: Diagnosis present

## 2020-03-10 DIAGNOSIS — J1282 Pneumonia due to coronavirus disease 2019: Secondary | ICD-10-CM | POA: Diagnosis not present

## 2020-03-10 DIAGNOSIS — A409 Streptococcal sepsis, unspecified: Secondary | ICD-10-CM | POA: Diagnosis not present

## 2020-03-10 DIAGNOSIS — U071 COVID-19: Secondary | ICD-10-CM | POA: Diagnosis not present

## 2020-03-10 DIAGNOSIS — Z79899 Other long term (current) drug therapy: Secondary | ICD-10-CM | POA: Diagnosis not present

## 2020-03-10 DIAGNOSIS — E039 Hypothyroidism, unspecified: Secondary | ICD-10-CM | POA: Diagnosis not present

## 2020-03-10 DIAGNOSIS — Z803 Family history of malignant neoplasm of breast: Secondary | ICD-10-CM | POA: Diagnosis not present

## 2020-03-10 DIAGNOSIS — W182XXA Fall in (into) shower or empty bathtub, initial encounter: Secondary | ICD-10-CM | POA: Diagnosis present

## 2020-03-10 DIAGNOSIS — I10 Essential (primary) hypertension: Secondary | ICD-10-CM | POA: Diagnosis not present

## 2020-03-10 DIAGNOSIS — I639 Cerebral infarction, unspecified: Secondary | ICD-10-CM

## 2020-03-10 DIAGNOSIS — A408 Other streptococcal sepsis: Secondary | ICD-10-CM | POA: Diagnosis not present

## 2020-03-10 DIAGNOSIS — Z23 Encounter for immunization: Secondary | ICD-10-CM | POA: Diagnosis not present

## 2020-03-10 DIAGNOSIS — I6389 Other cerebral infarction: Secondary | ICD-10-CM

## 2020-03-10 DIAGNOSIS — Z8673 Personal history of transient ischemic attack (TIA), and cerebral infarction without residual deficits: Secondary | ICD-10-CM | POA: Diagnosis not present

## 2020-03-10 DIAGNOSIS — Z170 Estrogen receptor positive status [ER+]: Secondary | ICD-10-CM | POA: Diagnosis not present

## 2020-03-10 DIAGNOSIS — R9082 White matter disease, unspecified: Secondary | ICD-10-CM | POA: Diagnosis not present

## 2020-03-10 DIAGNOSIS — I517 Cardiomegaly: Secondary | ICD-10-CM | POA: Diagnosis not present

## 2020-03-10 DIAGNOSIS — Z66 Do not resuscitate: Secondary | ICD-10-CM | POA: Diagnosis not present

## 2020-03-10 DIAGNOSIS — E871 Hypo-osmolality and hyponatremia: Secondary | ICD-10-CM | POA: Diagnosis not present

## 2020-03-10 DIAGNOSIS — Z6841 Body Mass Index (BMI) 40.0 and over, adult: Secondary | ICD-10-CM | POA: Diagnosis not present

## 2020-03-10 DIAGNOSIS — Z452 Encounter for adjustment and management of vascular access device: Secondary | ICD-10-CM | POA: Diagnosis not present

## 2020-03-10 DIAGNOSIS — D696 Thrombocytopenia, unspecified: Secondary | ICD-10-CM | POA: Diagnosis not present

## 2020-03-10 DIAGNOSIS — E860 Dehydration: Secondary | ICD-10-CM | POA: Diagnosis not present

## 2020-03-10 DIAGNOSIS — C7951 Secondary malignant neoplasm of bone: Secondary | ICD-10-CM | POA: Diagnosis not present

## 2020-03-10 DIAGNOSIS — I6523 Occlusion and stenosis of bilateral carotid arteries: Secondary | ICD-10-CM | POA: Diagnosis not present

## 2020-03-10 DIAGNOSIS — J323 Chronic sphenoidal sinusitis: Secondary | ICD-10-CM | POA: Diagnosis not present

## 2020-03-10 DIAGNOSIS — C50811 Malignant neoplasm of overlapping sites of right female breast: Secondary | ICD-10-CM | POA: Diagnosis not present

## 2020-03-10 DIAGNOSIS — K7460 Unspecified cirrhosis of liver: Secondary | ICD-10-CM | POA: Diagnosis present

## 2020-03-10 DIAGNOSIS — I611 Nontraumatic intracerebral hemorrhage in hemisphere, cortical: Secondary | ICD-10-CM | POA: Diagnosis not present

## 2020-03-10 DIAGNOSIS — I080 Rheumatic disorders of both mitral and aortic valves: Secondary | ICD-10-CM | POA: Diagnosis not present

## 2020-03-10 DIAGNOSIS — Z7982 Long term (current) use of aspirin: Secondary | ICD-10-CM | POA: Diagnosis not present

## 2020-03-10 DIAGNOSIS — E031 Congenital hypothyroidism without goiter: Secondary | ICD-10-CM | POA: Diagnosis not present

## 2020-03-10 DIAGNOSIS — R8281 Pyuria: Secondary | ICD-10-CM | POA: Diagnosis present

## 2020-03-10 DIAGNOSIS — Z515 Encounter for palliative care: Secondary | ICD-10-CM | POA: Diagnosis not present

## 2020-03-10 DIAGNOSIS — M479 Spondylosis, unspecified: Secondary | ICD-10-CM | POA: Diagnosis not present

## 2020-03-10 DIAGNOSIS — I76 Septic arterial embolism: Secondary | ICD-10-CM | POA: Diagnosis present

## 2020-03-10 DIAGNOSIS — G319 Degenerative disease of nervous system, unspecified: Secondary | ICD-10-CM | POA: Diagnosis not present

## 2020-03-10 LAB — CBC
HCT: 33.4 % — ABNORMAL LOW (ref 36.0–46.0)
Hemoglobin: 11 g/dL — ABNORMAL LOW (ref 12.0–15.0)
MCH: 34.8 pg — ABNORMAL HIGH (ref 26.0–34.0)
MCHC: 32.9 g/dL (ref 30.0–36.0)
MCV: 105.7 fL — ABNORMAL HIGH (ref 80.0–100.0)
Platelets: 124 10*3/uL — ABNORMAL LOW (ref 150–400)
RBC: 3.16 MIL/uL — ABNORMAL LOW (ref 3.87–5.11)
RDW: 15.2 % (ref 11.5–15.5)
WBC: 6.8 10*3/uL (ref 4.0–10.5)
nRBC: 0 % (ref 0.0–0.2)

## 2020-03-10 LAB — ECHOCARDIOGRAM COMPLETE
Area-P 1/2: 3.31 cm2
Height: 59 in
S' Lateral: 2.5 cm
Weight: 3616 oz

## 2020-03-10 LAB — CREATININE, SERUM
Creatinine, Ser: 0.63 mg/dL (ref 0.44–1.00)
GFR, Estimated: >60 mL/min (ref >60)

## 2020-03-10 LAB — BRAIN NATRIURETIC PEPTIDE: B Natriuretic Peptide: 93 pg/mL (ref 0.0–100.0)

## 2020-03-10 LAB — D-DIMER, QUANTITATIVE: D-Dimer, Quant: 2.9 ug/mL-FEU — ABNORMAL HIGH (ref 0.00–0.50)

## 2020-03-10 LAB — TSH: TSH: 5.611 u[IU]/mL — ABNORMAL HIGH (ref 0.350–4.500)

## 2020-03-10 LAB — C-REACTIVE PROTEIN: CRP: 11.9 mg/dL — ABNORMAL HIGH (ref <1.0)

## 2020-03-10 MED ORDER — SODIUM CHLORIDE (PF) 0.9 % IJ SOLN
INTRAMUSCULAR | Status: AC
  Filled 2020-03-10: qty 50

## 2020-03-10 MED ORDER — DIPHENHYDRAMINE HCL 50 MG/ML IJ SOLN: 50.0000 mg | Freq: Once | INTRAMUSCULAR | Status: DC | PRN

## 2020-03-10 MED ORDER — METHYLPREDNISOLONE SODIUM SUCC 125 MG IJ SOLR: 125.0000 mg | Freq: Once | INTRAMUSCULAR | Status: DC | PRN

## 2020-03-10 MED ORDER — STROKE: EARLY STAGES OF RECOVERY BOOK
Freq: Once | Status: DC
  Filled 2020-03-10: qty 1

## 2020-03-10 MED ORDER — IOHEXOL 350 MG/ML SOLN
100.0000 mL | Freq: Once | INTRAVENOUS | Status: AC | PRN
  Administered 2020-03-10: 100 mL via INTRAVENOUS

## 2020-03-10 MED ORDER — EPINEPHRINE 0.3 MG/0.3ML IJ SOAJ
0.3000 mg | Freq: Once | INTRAMUSCULAR | Status: DC | PRN
  Filled 2020-03-10: qty 0.6

## 2020-03-10 MED ORDER — FAMOTIDINE IN NACL 20-0.9 MG/50ML-% IV SOLN: 20.0000 mg | Freq: Once | INTRAVENOUS | Status: DC | PRN

## 2020-03-10 MED ORDER — SODIUM CHLORIDE 0.9 % IV SOLN
Freq: Once | INTRAVENOUS | Status: AC
  Filled 2020-03-10: qty 700

## 2020-03-10 MED ORDER — ALBUTEROL SULFATE HFA 108 (90 BASE) MCG/ACT IN AERS: 2.0000 | INHALATION_SPRAY | Freq: Once | RESPIRATORY_TRACT | Status: DC | PRN

## 2020-03-10 MED ORDER — SODIUM CHLORIDE 0.9 % IV SOLN: INTRAVENOUS | Status: DC | PRN

## 2020-03-10 MED ORDER — PERFLUTREN LIPID MICROSPHERE
1.0000 mL | INTRAVENOUS | Status: AC | PRN
  Administered 2020-03-10: 3 mL via INTRAVENOUS
  Filled 2020-03-10: qty 10

## 2020-03-10 NOTE — Progress Notes (Addendum)
PROGRESS NOTE    Jasmine Hood  FGH:[id number] DOB: 1946/01/08 DOA: 03/09/2020 PCP: Jasmine Hood, Jasmine Halsted, MD   Chief Complaint  Patient presents with  . Emesis    Brief Narrative:  Jasmine Hood is Jasmine Hood 74 y.o. female with medical history significant of metastatic breast cancer, with metastasis to the thoracic spine. Patient was brought to the hospital due to generalized weakness. She lives with her daughter, over the last approximately 5 to 10 days she has been developing progressive generalized weakness. About 2 days ago she got out of the bathroom after having Finleigh Cheong shower, on her way to her bedroom she collapsed on the floor. No head trauma or loss of consciousness. She required assistance to get back to her bedroom but she was too weak to stand up, per her report she stayed on the floor for about 48 hours. Today her daughter called EMS and she was transported to the hospital.  She complains of poor appetite, generalized malaise but not fevers or chills. Positive nausea, vomiting and diarrhea.  Denies any sick contacts.  She described her weakness as generalized, persistent, no improving factors, worse with exertion.  Assessment & Plan:   Principal Problem:   Weakness Active Problems:   Hypothyroidism   Essential hypertension   OSA (obstructive sleep apnea)   Malignant neoplasm of overlapping sites of right breast in female, estrogen receptor positive (HCC)   Bone metastases (HCC)   Stroke (HCC)  Generalized Weakness  Likely multifactorial with covid 19 virus infection, L cerebellar infarct and possible hemorrhagic metastasis Continue IVF PT/OT  COVID 19 Virus Infection Vaccinated with J&J maybe about 1 month ago She's high risk for severe disease due to obesity, malignancy, and multiple other medical issues Currently without oxygen requirement and denies significant SOB, she does have some interstitial changes in lungs concerning for pneumonia vs edema.  Meets  criteria for mab.  Discussed risks/benefits.  She's agreeable, ordered.   COVID-19 Labs  Recent Labs    03/10/20 0604  DDIMER 2.90*  CRP 11.9*    Lab Results  Component Value Date   SARSCOV2NAA POSITIVE (Jasmine Hood) 03/09/2020   [place] NEGATIVE 11/10/2019   [place] NEGATIVE 10/26/2018     L Cerebellar Infarct Neuro c/s, appreciate recs - suspected this was incidental NO asa/lovenox give concern for hemorrhagic metastatsis Statin Echo with EF 70-75% (see report) LE [place] negative for DVT Neuro c/s, appreciate recs - CTA head/neck PT/OT/SLP  Possible Hemorrhagic Metastasis Hold anticoagulation, neuro following Oncology c/s, appreciate recs Consider palliative care c/s  Metastatic Breast Cancer Follows with Dr. [name] outpatient Continue anastrozole C/s oncology  Dehydration  Hyponatremia: follow with IVF  Pyuria: follow urine culture, not clearly sx, follow   Hypertension: follow  OSA: continue cpap  DVT prophylaxis: SCD Code Status:DNR Family Communication: none at bedside Disposition:   Status is: Observation  The patient will require care spanning > 2 midnights and should be moved to inpatient because: Inpatient level of care appropriate due to severity of illness  Dispo: The patient is from: Home              Anticipated d/c is to: pending              Anticipated d/c date is: > 3 days              Patient currently is not medically stable to d/c.       Consultants:   Neurology  oncology  Procedures:  Summary:  BILATERAL:  -  No evidence of deep vein thrombosis seen in the lower extremities,  bilaterally.  - No evidence of superficial venous thrombosis in the lower extremities,  bilaterally.   Summary:  Right Carotid: Velocities in the right ICA are consistent with Jasmine Hood 1-39%  stenosis.   Left Carotid: Velocities in the left ICA are consistent with Jasmine Hood 1-39%  stenosis.   Vertebrals: Left vertebral artery demonstrates antegrade flow.  Right  vertebral       artery was not visualized.   IMPRESSIONS    1. Left ventricular ejection fraction, by estimation, is 70 to 75%. The  left ventricle has hyperdynamic function. The left ventricle has no  regional wall motion abnormalities. Left ventricular diastolic parameters  were normal.  2. Right ventricular systolic function is normal. The right ventricular  size is normal.  3. The mitral valve is normal in structure. Mild mitral valve  regurgitation. No evidence of mitral stenosis.  4. The aortic valve is tricuspid. Aortic valve regurgitation is mild.  Mild aortic valve sclerosis is present, with no evidence of aortic valve  stenosis.  5. The inferior vena cava is normal in size with greater than 50%  respiratory variability, suggesting right atrial pressure of 3 mmHg.    Antimicrobials:  Anti-infectives (From admission, onward)   None          Subjective: C/o fatigue x3-4 days Denies fevers or SOB  Objective: Vitals:   03/10/20 1600 03/10/20 1700 03/10/20 1735 03/10/20 1808  BP: 126/60 132/76  125/69  Pulse: 85 85  85  Resp: (!) 29 (!) 28  19  Temp:   97.6 F (36.4 C) 99.3 F (37.4 C)  TempSrc:    Oral  SpO2: 93% 93%  96%  Weight:      Height:       No intake or output data in the 24 hours ending 03/10/20 1909 Filed Weights   03/09/20 1210  Weight: 102.5 kg    Examination:  General exam: Appears calm and comfortable  Respiratory system: Clear to auscultation. Respiratory effort normal. Cardiovascular system: S1 & S2 heard, RRR Gastrointestinal system: Abdomen is nondistended, soft and nontender.  Central nervous system: Alert and oriented. No focal neurological deficits. Extremities: no LEE Skin: No rashes, lesions or ulcers Psychiatry: Judgement and insight appear normal. Mood & affect appropriate.     Data Reviewed: I have personally reviewed following labs and imaging studies  CBC: Recent Labs  Lab 03/09/20 1245  03/10/20 0604  WBC 9.5 6.8  NEUTROABS 7.7  --   HGB 11.9* 11.0*  HCT 35.1* 33.4*  MCV 104.5* 105.7*  PLT 124* 124*    Basic Metabolic Panel: Recent Labs  Lab 03/09/20 1245 03/10/20 0604  NA 133*  --   K 4.1  --   CL 101  --   CO2 23  --   GLUCOSE 150*  --   BUN 10  --   CREATININE 0.60 0.63  CALCIUM 7.5*  --     GFR: Estimated Creatinine Clearance: 63.2 mL/min (by C-G formula based on SCr of 0.63 mg/dL).  Liver Function Tests: Recent Labs  Lab 03/09/20 1245  AST 96*  ALT 33  ALKPHOS 105  BILITOT 1.2  PROT 6.8  ALBUMIN 2.5*    CBG: Recent Labs  Lab 03/09/20 1206  GLUCAP 148*     Recent Results (from the past 240 hour(s))  Respiratory Panel by RT PCR (Flu Kenzie Flakes&B, Covid) -     Status: Abnormal   Collection Time:  03/09/20  3:41 PM   Specimen: Nasopharyngeal  Result Value Ref Range Status   SARS Coronavirus 2 by RT PCR POSITIVE (Stein Windhorst) NEGATIVE Final    Comment: RESULT CALLED TO, READ BACK BY AND VERIFIED WITH: R.GROVES AT [place] ON 03/09/20 BY N.THOMPSON (NOTE) SARS-CoV-2 target nucleic acids are DETECTED.  SARS-CoV-2 RNA is generally detectable in upper respiratory specimens  during the acute phase of infection. Positive results are indicative of the presence of the identified virus, but do not rule out bacterial infection or co-infection with other pathogens not detected by the test. Clinical correlation with patient history and other diagnostic information is necessary to determine patient infection status. The expected result is Negative.  Fact Sheet for Patients:  PinkCheek.be  Fact Sheet for Healthcare Providers: GravelBags.it  This test is not yet approved or cleared by the [place] FDA and  has been authorized for detection and/or diagnosis of SARS-CoV-2 by FDA under an Emergency Use Authorization (EUA).  This EUA will remain in effect (meaning this test  can be used) for the duration  of  the COVID-19 declaration under Section 564(b)(1) of the Act, 21 U.S.C. section 360bbb-3(b)(1), unless the authorization is terminated or revoked sooner.      Influenza Jon Lall by PCR NEGATIVE NEGATIVE Final   Influenza B by PCR NEGATIVE NEGATIVE Final    Comment: (NOTE) The Xpert Xpress SARS-CoV-2/FLU/RSV assay is intended as an aid in  the diagnosis of influenza from Nasopharyngeal swab specimens and  should not be used as Alexandre Lightsey sole basis for treatment. Nasal washings and  aspirates are unacceptable for Xpert Xpress SARS-CoV-2/FLU/RSV  testing.  Fact Sheet for Patients: PinkCheek.be  Fact Sheet for Healthcare Providers: GravelBags.it  This test is not yet approved or cleared by the [place] FDA and  has been authorized for detection and/or diagnosis of SARS-CoV-2 by  FDA under an Emergency Use Authorization (EUA). This EUA will remain  in effect (meaning this test can be used) for the duration of the  Covid-19 declaration under Section 564(b)(1) of the Act, 21  U.S.C. section 360bbb-3(b)(1), unless the authorization is  terminated or revoked. Performed at [facility], [place] [address] [place], [place] [address]          Radiology Studies: CT Head Wo Contrast  Result Date: 03/09/2020 CLINICAL DATA:  Head trauma, minor. Additional history provided: Patient found down, stage IV breast cancer with known metastases. EXAM: CT HEAD WITHOUT CONTRAST TECHNIQUE: Contiguous axial images were obtained from the base of the skull through the vertex without intravenous contrast. COMPARISON:  Brain MRI 09/27/2017.  Head CT 02/19/2017. FINDINGS: Brain: Mild generalized cerebral atrophy. There is Hadden Steig 5 mm nodular hyperdensity focus measuring 60 Hounsfield units within the posterior right temporal lobe (series 5, image 43) (series 6, image 11). No demarcated cortical infarct. No extra-axial fluid collection. No midline  shift. Partially empty sella turcica. Vascular: No hyperdense vessel. Skull: Multiple known calvarial metastases were better appreciated on the prior brain MRI of 09/27/2017. Sinuses/Orbits: Visualized orbits show no acute finding. Frothy secretions within the right sphenoid sinus. No significant mastoid effusion. These results were called by telephone at the time of interpretation on 03/09/2020 at 2:57 pm to provider [facility] , who verbally acknowledged these results. IMPRESSION: 5 mm nodular hyperdensity within the posterior right temporal as described. This may reflect Laiken Nohr hyperdense metastasis. Azka Steger small acute parenchymal hemorrhage cannot be excluded. Mild generalized cerebral atrophy. Multiple known calvarial metastases were better appreciated on the prior brain  MRI of 09/27/2017. Right sphenoid sinusitis. Electronically Signed   By: Kellie Simmering DO   On: 03/09/2020 14:58   MR BRAIN WO CONTRAST  Result Date: 03/09/2020 CLINICAL DATA:  Initial evaluation for acute nausea, vomiting, weakness. History of breast cancer. EXAM: MRI HEAD WITHOUT CONTRAST TECHNIQUE: Multiplanar, multiecho pulse sequences of the brain and surrounding structures were obtained without intravenous contrast. COMPARISON:  Prior CT from earlier same day as well as previous brain MRI from 09/27/2017. FINDINGS: Brain: Diffuse prominence of the CSF containing spaces compatible generalized age-related cerebral atrophy. Patchy T2/FLAIR hyperintensity within the periventricular and deep white matter both cerebral hemispheres most consistent with chronic small vessel ischemic disease, mild for age, and relatively stable. 9 mm linear focus of restricted diffusion seen involving the left cerebellum, consistent with an acute ischemic infarct (series 5, image 69). No associated hemorrhage or mass effect. Few additional punctate foci of diffusion abnormality involving the right centrum semi ovale noted as well, which could reflect tiny subacute  small vessel type infarcts (series 18, images 45, 41). No associated hemorrhage. Gray-white matter differentiation otherwise maintained. No other areas of chronic cortical infarction. 7 mm focus of susceptibility artifacts seen involving the right temporal lobe, corresponding to prior hyperdensity seen on prior CT (series 15, image 25). This is new as compared to prior MRI from 2019. Subtly increased localized T2/FLAIR signal intensity seen at this location suggesting edema (series 16, image 25). No significant regional mass effect. Given the history of metastatic breast cancer, finding is most concerning for Benjamine Strout possible new intracranial metastasis. No other definite metastatic lesions are seen on this noncontrast examination. No midline shift or mass effect. Ventricles stable in size without hydrocephalus. No extra-axial fluid collection. No made of an empty sella with sellar expansion. Midline structures intact. Vascular: Major intracranial vascular flow voids are maintained. Skull and upper cervical spine: Craniocervical junction within normal limits. Few scattered T1 hypointense lesions noted about the calvarium, consistent with known osseous metastatic disease. No visible extra osseous or soft tissue component. No scalp soft tissue abnormality. Sinuses/Orbits: Patient status post bilateral ocular lens replacement. Globes and orbital soft tissues demonstrate no acute finding. Right sphenoid sinus disease noted. Paranasal sinuses are otherwise clear. Trace left mastoid effusion noted, of doubtful significance. Inner ear structures grossly normal. Other: None. IMPRESSION: 1. 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 2. Few additional punctate foci of diffusion abnormality involving the right centrum semi ovale, suspicious for tiny subacute nonhemorrhagic small vessel type infarcts. 3. 7 mm focus of susceptibility artifact involving the right temporal lobe, corresponding to hyperdensity seen on  prior CT. Subtly increased localized T2/FLAIR signal intensity at this location without significant regional mass effect. Given the history of metastatic breast cancer, finding is most concerning for Noah Lembke possible hemorrhagic metastasis. If possible, further evaluation with postcontrast imaging may be helpful for further evaluation. Alternatively, Tkeya Stencil short interval follow-up study in 2-3 months to evaluate for interval change may be helpful as well. 4. Scattered T1 hypointense lesions about the calvarium, consistent with known osseous metastatic disease. 5. Age-related cerebral atrophy with mild chronic small vessel ischemic disease. Electronically Signed   By: Jeannine Boga M.D.   On: 03/09/2020 22:15   DG CHEST PORT 1 VIEW  Result Date: 03/10/2020 CLINICAL DATA:  COVID EXAM: PORTABLE CHEST 1 VIEW COMPARISON:  11/10/2019 FINDINGS: Shallow inspiration. Cardiac enlargement. Interstitial changes in the lungs may represent multifocal pneumonia or edema. No pleural effusions. No pneumothorax. Mediastinal contours appear intact. Degenerative changes in  the spine. IMPRESSION: Cardiac enlargement. Interstitial changes in the lungs may represent multifocal pneumonia or edema. Electronically Signed   By: Lucienne Capers M.D.   On: 03/10/2020 04:39   DG Abdomen Acute W/Chest  Result Date: 03/09/2020 CLINICAL DATA:  Metastatic breast cancer.  Weakness. EXAM: DG ABDOMEN ACUTE WITH 1 VIEW CHEST COMPARISON:  Chest radiograph 11/10/2019. Abdominal radiographs 11/24/2008. FINDINGS: The cardiomediastinal silhouette is unchanged. Asymmetric opacity is again noted in the right upper lobe consistent with post radiation changes. The interstitial markings are mildly prominent bilaterally without overt edema. No acute airspace consolidation, sizeable pleural effusion, pneumothorax is identified. No acute osseous abnormality is seen. There is no evidence of intraperitoneal free air. Gas is present in nondilated colon. No  dilated loops of bowel are seen to suggest obstruction. IMPRESSION: Nonobstructed bowel gas pattern. Chronic lung changes without evidence of acute cardiopulmonary disease. Electronically Signed   By: Logan Bores M.D.   On: 03/09/2020 14:32   ECHOCARDIOGRAM COMPLETE  Result Date: 03/10/2020    ECHOCARDIOGRAM REPORT   Patient Name:   Jasmine Hood Franqui Date of Exam: 03/10/2020 Medical Rec #:  [id number]      Height:       59.0 in Accession #:    [id number]     Weight:       226.0 lb Date of Birth:  1945-06-01      BSA:          1.943 m Patient Age:    49 years       BP:           136/68 mmHg Patient Gender: F              HR:           87 bpm. Exam Location:  Inpatient Procedure: 2D Echo, Cardiac Doppler, Color Doppler and Intracardiac            Opacification Agent Indications:   Stroke 434.91 / I163.9  History:       Patient has no prior history of Echocardiogram examinations.                Stroke.  Sonographer:   Bernadene Person RDCS Referring      [place] Phys: IMPRESSIONS  1. Left ventricular ejection fraction, by estimation, is 70 to 75%. The left ventricle has hyperdynamic function. The left ventricle has no regional wall motion abnormalities. Left ventricular diastolic parameters were normal.  2. Right ventricular systolic function is normal. The right ventricular size is normal.  3. The mitral valve is normal in structure. Mild mitral valve regurgitation. No evidence of mitral stenosis.  4. The aortic valve is tricuspid. Aortic valve regurgitation is mild. Mild aortic valve sclerosis is present, with no evidence of aortic valve stenosis.  5. The inferior vena cava is normal in size with greater than 50% respiratory variability, suggesting right atrial pressure of 3 mmHg. FINDINGS  Left Ventricle: Left ventricular ejection fraction, by estimation, is 70 to 75%. The left ventricle has hyperdynamic function. The left ventricle has no regional wall motion abnormalities. Definity contrast  agent was given IV to delineate the left ventricular endocardial borders. The left ventricular internal cavity size was normal in size. There is no left ventricular hypertrophy. Left ventricular diastolic parameters were normal. Normal left ventricular filling pressure. Right Ventricle: The right ventricular size is normal. No increase in right ventricular wall thickness. Right ventricular systolic function is normal. Left Atrium: Left atrial size was  normal in size. Right Atrium: Right atrial size was normal in size. Pericardium: There is no evidence of pericardial effusion. Mitral Valve: The mitral valve is normal in structure. Mild mitral valve regurgitation. No evidence of mitral valve stenosis. Tricuspid Valve: The tricuspid valve is normal in structure. Tricuspid valve regurgitation is not demonstrated. No evidence of tricuspid stenosis. Aortic Valve: The aortic valve is tricuspid. Aortic valve regurgitation is mild. Mild aortic valve sclerosis is present, with no evidence of aortic valve stenosis. Pulmonic Valve: The pulmonic valve was normal in structure. Pulmonic valve regurgitation is not visualized. No evidence of pulmonic stenosis. Aorta: The aortic root is normal in size and structure. Venous: The inferior vena cava is normal in size with greater than 50% respiratory variability, suggesting right atrial pressure of 3 mmHg. IAS/Shunts: No atrial level shunt detected by color flow Doppler.  LEFT VENTRICLE PLAX 2D LVIDd:         4.20 cm  Diastology LVIDs:         2.50 cm  LV e' medial:    9.03 cm/s LV PW:         0.90 cm  LV E/e' medial:  9.3 LV IVS:        0.90 cm  LV e' lateral:   11.00 cm/s LVOT diam:     2.10 cm  LV E/e' lateral: 7.6 LV SV:         108 LV SV Index:   55 LVOT Area:     3.46 cm  RIGHT VENTRICLE RV S prime:     14.20 cm/s TAPSE (M-mode): 1.6 cm LEFT ATRIUM             Index       RIGHT ATRIUM           Index LA diam:        3.70 cm 1.90 cm/m  RA Area:     10.20 cm LA Vol (A2C):   37.9  ml 19.51 ml/m RA Volume:   17.50 ml  9.01 ml/m LA Vol (A4C):   34.4 ml 17.71 ml/m LA Biplane Vol: 38.3 ml 19.71 ml/m  AORTIC VALVE LVOT Vmax:   129.00 cm/s LVOT Vmean:  99.500 cm/s LVOT VTI:    0.311 m  AORTA Ao Root diam: 2.30 cm Ao Asc diam:  3.10 cm MITRAL VALVE                TRICUSPID VALVE MV Area (PHT): 3.31 cm     TR Peak grad:   21.5 mmHg MV Decel Time: 229 msec     TR Vmax:        232.00 cm/s MV E velocity: 83.90 cm/s MV Harpreet Signore velocity: 103.00 cm/s  SHUNTS MV E/Caria Transue ratio:  0.81         Systemic VTI:  0.31 m                             Systemic Diam: 2.10 cm Fransico Him MD Electronically signed by Fransico Him MD Signature Date/Time: 03/10/2020/2:34:49 PM    Final    VAS US CAROTID  Result Date: 03/10/2020 Carotid Arterial Duplex Study Indications: CVA. Limitations  Today's exam was limited due to the patient's respiratory              variation. Performing Technologist: Abram Sander RVS  Examination Guidelines: Hasson Gaspard complete evaluation includes B-mode imaging, spectral Doppler, color Doppler, and power Doppler as needed of all  accessible portions of each vessel. Bilateral testing is considered an integral part of Seirra Kos complete examination. Limited examinations for reoccurring indications may be performed as noted.  Right Carotid Findings: +----------+--------+--------+--------+------------------+--------+           PSV cm/sEDV cm/sStenosisPlaque DescriptionComments +----------+--------+--------+--------+------------------+--------+ CCA Prox  70      13              heterogenous               +----------+--------+--------+--------+------------------+--------+ CCA Distal75      17              heterogenous               +----------+--------+--------+--------+------------------+--------+ ICA Prox  128     32      1-39%   heterogenous               +----------+--------+--------+--------+------------------+--------+ ICA Distal94      22                                          +----------+--------+--------+--------+------------------+--------+ ECA       55      12                                         +----------+--------+--------+--------+------------------+--------+ +----------+--------+-------+--------+-------------------+           PSV cm/sEDV cmsDescribeArm Pressure (mmHG) +----------+--------+-------+--------+-------------------+ [id number]                                         +----------+--------+-------+--------+-------------------+ +---------+--------+--------+--------------+ VertebralPSV cm/sEDV cm/sNot identified +---------+--------+--------+--------------+  Left Carotid Findings: +----------+--------+--------+--------+------------------+--------+           PSV cm/sEDV cm/sStenosisPlaque DescriptionComments +----------+--------+--------+--------+------------------+--------+ CCA Prox  95                      heterogenous               +----------+--------+--------+--------+------------------+--------+ CCA Distal82      27              heterogenous               +----------+--------+--------+--------+------------------+--------+ ICA Prox  135     38      1-39%   heterogenous               +----------+--------+--------+--------+------------------+--------+ ICA Distal110     37                                         +----------+--------+--------+--------+------------------+--------+ ECA       120     10                                         +----------+--------+--------+--------+------------------+--------+ +----------+--------+--------+--------+-------------------+           PSV cm/sEDV cm/sDescribeArm Pressure (mmHG) +----------+--------+--------+--------+-------------------+ [id number]                                          +----------+--------+--------+--------+-------------------+ +---------+--------+--+--------+--+---------+  VertebralPSV cm/s66EDV cm/s20Antegrade  +---------+--------+--+--------+--+---------+   Summary: Right Carotid: Velocities in the right ICA are consistent with Brenlee Koskela 1-39% stenosis. Left Carotid: Velocities in the left ICA are consistent with Kain Milosevic 1-39% stenosis. Vertebrals: Left vertebral artery demonstrates antegrade flow. Right vertebral             artery was not visualized. *See table(s) above for measurements and observations.     Preliminary    VAS [place] LOWER EXTREMITY VENOUS (DVT)  Result Date: 03/10/2020  Lower Venous DVTStudy Indications: Stroke.  Limitations: Pain tolerance. Comparison Study: no prior Performing Technologist: Abram Sander RVS  Examination Guidelines: Diedra Sinor complete evaluation includes B-mode imaging, spectral Doppler, color Doppler, and power Doppler as needed of all accessible portions of each vessel. Bilateral testing is considered an integral part of Pahola Dimmitt complete examination. Limited examinations for reoccurring indications may be performed as noted. The reflux portion of the exam is performed with the patient in reverse Trendelenburg.  +---------+---------------+---------+-----------+----------+--------------+ RIGHT    CompressibilityPhasicitySpontaneityPropertiesThrombus Aging +---------+---------------+---------+-----------+----------+--------------+ CFV      Full           Yes      Yes                                 +---------+---------------+---------+-----------+----------+--------------+ SFJ      Full                                                        +---------+---------------+---------+-----------+----------+--------------+ FV Prox  Full                                                        +---------+---------------+---------+-----------+----------+--------------+ FV Mid                  Yes      Yes                                 +---------+---------------+---------+-----------+----------+--------------+ FV Distal               Yes      Yes                                  +---------+---------------+---------+-----------+----------+--------------+ PFV      Full                                                        +---------+---------------+---------+-----------+----------+--------------+ POP      Full           Yes      Yes                                 +---------+---------------+---------+-----------+----------+--------------+ PTV      Full                                                        +---------+---------------+---------+-----------+----------+--------------+  PERO                                                  Not visualized +---------+---------------+---------+-----------+----------+--------------+   +---------+---------------+---------+-----------+----------+--------------+ LEFT     CompressibilityPhasicitySpontaneityPropertiesThrombus Aging +---------+---------------+---------+-----------+----------+--------------+ CFV      Full           Yes      Yes                                 +---------+---------------+---------+-----------+----------+--------------+ SFJ      Full                                                        +---------+---------------+---------+-----------+----------+--------------+ FV Prox  Full                                                        +---------+---------------+---------+-----------+----------+--------------+ FV Mid                  Yes      Yes                                 +---------+---------------+---------+-----------+----------+--------------+ FV Distal               Yes      Yes                                 +---------+---------------+---------+-----------+----------+--------------+ PFV      Full                                                        +---------+---------------+---------+-----------+----------+--------------+ POP      Full           Yes      Yes                                  +---------+---------------+---------+-----------+----------+--------------+ PTV      Full                                                        +---------+---------------+---------+-----------+----------+--------------+ PERO                                                  Not visualized +---------+---------------+---------+-----------+----------+--------------+  Summary: BILATERAL: - No evidence of deep vein thrombosis seen in the lower extremities, bilaterally. - No evidence of superficial venous thrombosis in the lower extremities, bilaterally. -   *See table(s) above for measurements and observations. Electronically signed by Servando Snare MD on 03/10/2020 at 1:10:27 PM.    Final         Scheduled Meds: . anastrozole  1 mg Oral Daily  . levothyroxine  25 mcg Oral Q0600  . pantoprazole  40 mg Oral Daily   Continuous Infusions: . sodium chloride    . dextrose 5% lactated ringers 75 mL/hr at 03/10/20 0359  . famotidine (PEPCID) IV       LOS: 0 days    Time spent: over 30 min    Fayrene Helper, MD [facility]   To contact the attending provider between 7A-7P or the covering provider during after hours 7P-7A, please log into the web site [email or web address] and access using universal [place] password for that web site. If you do not have the password, please call the hospital operator.  03/10/2020, 7:09 PM

## 2020-03-10 NOTE — Progress Notes (Signed)
Carotid duplex has been completed.   Preliminary results in CV Proc.   Abram Sander 03/10/2020 11:03 AM

## 2020-03-10 NOTE — ED Notes (Signed)
Patient repositioned in bed. Patient given breakfast. Patient sitting up right and feeding self.

## 2020-03-10 NOTE — Progress Notes (Signed)
Lower extremity venous has been completed.   Preliminary results in CV Proc.   Abram Sander 03/10/2020 9:13 AM

## 2020-03-10 NOTE — Progress Notes (Signed)
Patient ID: Jasmine Hood, female   DOB: 21-Jan-1946, 74 y.o.   MRN: [id number] I was called to review the CT and MRI brain which shows a very tiny hyerdensity in the R temporal lobe without edema or mass effect, measures up to 7 mm on non  Contrast MRI.  Unsure what this lesion represents, but given her hx it is concerning for tiny met. But this lesion is so tiny it is clinically insignificant at present and not causing any of her current issues.   It is too small to biopsy  It should be followed with imaging. Oncology can arrange this. The next MRI should include post contrast imaging.  Please call if we can be of assistance

## 2020-03-10 NOTE — ED Notes (Signed)
Patient received breakfast tray 

## 2020-03-10 NOTE — Progress Notes (Signed)
  Echocardiogram 2D Echocardiogram has been performed.  Jasmine Hood 03/10/2020, 2:23 PM

## 2020-03-10 NOTE — Progress Notes (Signed)
Patient does not want to wear CPAP tonight. Patient had a recent sleep study done but has not received results yet. She stated that she would not be able to wear a mask because she wont be able to breath.

## 2020-03-10 NOTE — ED Notes (Signed)
Patient repositioned on to bed pan. Patient will call out when finished.

## 2020-03-10 NOTE — Consult Note (Addendum)
Neurology Consultation  Reason for Consult: Pontine infarct Referring Physician: Powell, A Caldwell Jr., MD  CC: Pontine infarct  History is obtained from: Note and patient  HPI: Jasmine Hood is a 74 y.o. female who is currently Covid positive with history of stroke, obesity, breast cancer and hypothyroidism.  Patient states that 5 days ago she started to feel very weak all over.  2 days ago she got out of the bathroom after having a shower, on the way to her bedroom she collapsed on the floor.  She did require assistance to get back to her bedroom but was too weak to stand up.  Yesterday her daughter called EMS and she was brought to the hospital.  Per hospitalist she did have complaints of poor appetite, generalized malaise.  She was also positive for nausea, vomiting and diarrhea.  As noted above she was positive for coronavirus.  While in the ED MRI brain was obtained, a 9 mm acute ischemic nonhemorrhagic left cerebellar infarct was noted few additional punctate foci of diffusion abnormality involving the right centrum semiovale, suspicious for tiny subacute nonhemorrhagic vessel type infarcts.  There was also submillimeter focus of susceptibility artifact involving the right temporal lobe corresponding to the hyperdensity and prior head CT.  There is no mass-effect.    Currently during consultation patient's only complaint is muscle aches and weakness which is generalized.  Does not complain of any localizing or lateralizing abnormalities.    LKW: 5 days ago tpa given?: no, out of window Premorbid modified Rankin scale (mRS): 0 NIHSS 1a Level of Conscious.: 0 1b LOC Questions: 0 1c LOC Commands: 0 2 Best Gaze:0  3 Visual: 0 4 Facial Palsy: 0 5a Motor Arm - left: 0 5b Motor Arm - Right: 0 6a Motor Leg - Left: 0 6b Motor Leg - Right: 0 7 Limb Ataxia: 0 8 Sensory: 0 9 Best Language: 0 10 Dysarthria: 0 11 Extinct. and Inatten.: 0 TOTAL: 0  Past Medical History:  Diagnosis Date   . Breast cancer (HCC) 10/07/2016   right breast, spine  . Brown recluse spider bite    right leg  . COLONIC POLYPS, HX OF 01/05/2009   Qualifier: Diagnosis of  By: Kwiatkowski  MD, Peter F   . Complication of anesthesia    slow to awaken after wisdom  teeth extraction age 23  . Constipation   . Genetic testing 11/27/2016   Ms. Hobson underwent genetic counseling and testing for hereditary cancer syndromes on 11/19/2016. Her results were negative for mutations in all 46 genes analyzed by [facility]'s 46-gene Common Hereditary Cancers Panel. Genes analyzed include: APC, ATM, AXIN2, BARD1, BMPR1A, BRCA1, BRCA2, BRIP1, CDH1, CDKN2A, CHEK2, CTNNA1, DICER1, EPCAM, GREM1, HOXB13, KIT, MEN1, MLH1, MSH2, MSH3, MSH6, MUTYH, NBN,  . Hypothyroidism   . Morbid obesity (HCC) 01/05/2009   Qualifier: Diagnosis of  By: Kwiatkowski  MD, Peter F   . Pneumonia 1987  . Sleep apnea    no cpap yet, has not recieved results from home test yet  . Stroke (HCC)    found on CT scan, no deficits   Family History  Problem Relation Age of Onset  . Breast cancer Mother 40       d.52 from metastatic disease  . Throat cancer Father        d.65 history of smoking  . Rheum arthritis Sister        3 sisters pos. for osteo and RA  . Diabetes Maternal Aunt   .   Cancer Maternal Grandfather        mouth cancer-chewed tobacco  . Breast cancer Cousin 65  . Breast cancer Daughter 76  . Stomach cancer Sister 21   Social History:   reports that she has never smoked. She has never used smokeless tobacco. She reports that she does not drink alcohol and does not use drugs.  Medications  Current Facility-Administered Medications:  .  acetaminophen (TYLENOL) tablet 650 mg, 650 mg, Oral, Q6H PRN, 650 mg at 03/10/20 1005 **OR** acetaminophen (TYLENOL) suppository 650 mg, 650 mg, Rectal, Q6H PRN, Arrien, Jimmy Picket, MD .  anastrozole (ARIMIDEX) tablet 1 mg, 1 mg, Oral, Daily, Arrien, Jimmy Picket, MD, 1 mg at 03/09/20  2200 .  dextrose 5 % in lactated ringers infusion, , Intravenous, Continuous, Arrien, Jimmy Picket, MD, Last Rate: 75 mL/hr at 03/10/20 0359, New Bag at 03/10/20 0359 .  enoxaparin (LOVENOX) injection 40 mg, 40 mg, Subcutaneous, Q24H, Arrien, Jimmy Picket, MD, 40 mg at 03/09/20 2200 .  levothyroxine (SYNTHROID) tablet 25 mcg, 25 mcg, Oral, Q0600, Arrien, Jimmy Picket, MD, 25 mcg at 03/10/20 [phone] .  ondansetron (ZOFRAN) tablet 4 mg, 4 mg, Oral, Q6H PRN **OR** ondansetron (ZOFRAN) injection 4 mg, 4 mg, Intravenous, Q6H PRN, Arrien, Jimmy Picket, MD .  pantoprazole (PROTONIX) EC tablet 40 mg, 40 mg, Oral, Daily, Arrien, Jimmy Picket, MD, 40 mg at 03/10/20 1005  Current Outpatient Medications:  .  anastrozole (ARIMIDEX) 1 MG tablet, Take 1 tablet (1 mg total) by mouth daily., Disp: 90 tablet, Rfl: 4 .  calcium carbonate (TUMS) 500 MG chewable tablet, Chew 1 tablet (200 mg of elemental calcium total) by mouth 2 (two) times daily. (Patient not taking: Reported on 11/10/2019), Disp: 120 tablet, Rfl: 4 .  levothyroxine (SYNTHROID) 25 MCG tablet, Take 1 tablet (25 mcg total) by mouth daily at 6 (six) AM. (Patient not taking: Reported on 03/09/2020), Disp: 30 tablet, Rfl: 0  ROS:    General ROS: Positive for - fatigue, loss of appetite Ophthalmic ROS: negative for - blurry vision, double vision, eye pain or loss of vision ENT ROS: negative for - epistaxis, nasal discharge, oral lesions, sore throat, tinnitus or vertigo Respiratory ROS: negative for - cough, hemoptysis, shortness of breath or wheezing Cardiovascular ROS: negative for - chest pain, dyspnea on exertion, edema or irregular heartbeat Gastrointestinal ROS: Positive for -  Diarrhea, nausea/vomiting  Genito-Urinary ROS: negative for - dysuria, hematuria, incontinence or urinary frequency/urgency Musculoskeletal ROS: Positive for - muscular weakness Neurological ROS: as noted in HPI Dermatological ROS: negative for rash and skin  lesion changes  Exam: Current vital signs: BP (!) 154/77   Pulse 88   Temp 98.7 F (37.1 C) (Oral)   Resp 17   Ht 4' 11" (1.499 m)   Wt 102.5 kg   SpO2 94%   BMI 45.65 kg/m  Vital signs in last 24 hours: Temp:  [97.8 F (36.6 C)-98.7 F (37.1 C)] 98.7 F (37.1 C) (10/22 1000) Pulse Rate:  [77-95] 88 (10/22 1030) Resp:  [16-31] 17 (10/22 1030) BP: (106-157)/(61-111) 154/77 (10/22 1030) SpO2:  [90 %-98 %] 94 % (10/22 1030) Weight:  [102.5 kg] 102.5 kg (10/21 1210)   Constitutional: Appears well-developed and well-nourished.  Psych: Affect appropriate to situation Eyes: No scleral injection HENT: No OP obstrucion Head: Normocephalic.  Cardiovascular: Normal rate and regular rhythm.  Respiratory: Effort normal, non-labored breathing GI: Soft.  No distension. There is no tenderness.  Skin: WDI  Neuro: Mental Status: Patient is awake,  alert, oriented to person, place, month, year, and situation. Speech-with no aphasia or dysarthria.  Naming, repeating, comprehension intact.  Patient is able to follow all commands however does have difficulty doing so secondary to weakness. Cranial Nerves: II: Visual Fields are full.  III,IV, VI: EOMI without ptosis or diploplia. Pupils equal, round and reactive to light V: Facial sensation is symmetric to temperature VII: Facial movement is symmetric.  VIII: hearing is intact to voice XI: Shoulder shrug is symmetric. XII: tongue is midline without atrophy or fasciculations.  Motor: Tone is normal. Bulk is normal. 5/5 strength was present in upper extremities.  4/5 strength in lower extremities Sensory: Sensation is symmetric to light touch and temperature in the arms and legs. Deep Tendon Reflexes: 2+ and symmetric in the biceps and patellae.  Plantars: Toes are downgoing bilaterally.  Cerebellar: FNF shows no dysmetria  Labs I have reviewed labs in epic and the results pertinent to this consultation are:   CBC    Component  Value Date/Time   WBC 6.8 03/10/2020 0604   RBC 3.16 (L) 03/10/2020 0604   HGB 11.0 (L) 03/10/2020 0604   HGB 12.7 02/21/2020 1307   HGB 9.6 (L) 03/21/2017 0918   HCT 33.4 (L) 03/10/2020 0604   HCT 28.8 (L) 03/21/2017 0918   PLT 124 (L) 03/10/2020 0604   PLT 130 (L) 02/21/2020 1307   PLT 167 03/21/2017 0918   MCV 105.7 (H) 03/10/2020 0604   MCV 101.0 03/21/2017 0918   MCH 34.8 (H) 03/10/2020 0604   MCHC 32.9 03/10/2020 0604   RDW 15.2 03/10/2020 0604   RDW 22.2 (H) 03/21/2017 0918   LYMPHSABS 1.0 03/09/2020 1245   LYMPHSABS 0.9 03/21/2017 0918   MONOABS 0.7 03/09/2020 1245   MONOABS 0.7 03/21/2017 0918   EOSABS 0.0 03/09/2020 1245   EOSABS 0.1 03/21/2017 0918   BASOSABS 0.0 03/09/2020 1245   BASOSABS 0.1 03/21/2017 0918    CMP     Component Value Date/Time   NA 133 (L) 03/09/2020 1245   NA 140 03/21/2017 0918   K 4.1 03/09/2020 1245   K 3.7 03/21/2017 0918   CL 101 03/09/2020 1245   CO2 23 03/09/2020 1245   CO2 23 03/21/2017 0918   GLUCOSE 150 (H) 03/09/2020 1245   GLUCOSE 120 03/21/2017 0918   BUN 10 03/09/2020 1245   BUN 6.6 (L) 03/21/2017 0918   CREATININE 0.63 03/10/2020 0604   CREATININE 0.70 02/21/2020 1307   CREATININE 0.6 03/21/2017 0918   CALCIUM 7.5 (L) 03/09/2020 1245   CALCIUM 8.6 03/21/2017 0918   PROT 6.8 03/09/2020 1245   PROT 6.3 (L) 03/21/2017 0918   ALBUMIN 2.5 (L) 03/09/2020 1245   ALBUMIN 2.4 (L) 03/21/2017 0918   AST 96 (H) 03/09/2020 1245   AST 66 (H) 02/21/2020 1307   AST 53 (H) 03/21/2017 0918   ALT 33 03/09/2020 1245   ALT 40 02/21/2020 1307   ALT 29 03/21/2017 0918   ALKPHOS 105 03/09/2020 1245   ALKPHOS 123 03/21/2017 0918   BILITOT 1.2 03/09/2020 1245   BILITOT 0.7 02/21/2020 1307   BILITOT 0.55 03/21/2017 0918   GFRNONAA >60 03/10/2020 0604   GFRNONAA >60 02/21/2020 1307   GFRAA >60 02/21/2020 1307    Lipid Panel     Component Value Date/Time   CHOL 201 (H) 04/28/2015 1133   TRIG 113.0 04/28/2015 1133   HDL 59.70  04/28/2015 1133   CHOLHDL 3 04/28/2015 1133   VLDL 22.6 04/28/2015 1133   LDLCALC  119 (H) 04/28/2015 1133   LDLDIRECT 117.1 02/25/2014 0943     Imaging I have reviewed the images obtained:  CT-scan of the brain-5 mm nodule hyperdensity within the posterior right temporal lobe.  This may reflect hyperdense metastasis.  A small acute parenchymal hemorrhage cannot be excluded.  MRI examination of the brain-9 mm acute ischemic nonhemorrhagic left cerebellar infarct was noted few additional punctate foci of diffusion abnormality involving the right centrum semiovale, suspicious for tiny subacute nonhemorrhagic vessel type infarcts.  There was also submillimeter focus of susceptibility artifact involving the right temporal lobe corresponding to the hyperdensity and prior head CT.  There is no mass-effect.    David Smith PA-C [facility] [phone]  M-F  (9:00 am- 5:00 PM)  03/10/2020, 10:54 AM   Attending addendum Patient seen and examined Imaging independently reviewed-left cerebellar ischemic infarct and right temporal lobe susceptibility artifact concerning for a hemorrhagic metastasis.   Assessment: This is a 74-year-old female past history of breast cancer, obesity,  presenting to the hospital secondary to generalized malaise and weakness.  Upon arriving at hospital patient was found to be Covid positive.  Due to symptoms of generalized weakness, MRI was obtained showing ischemic infarct as noted above.  Also noted was a possible hemorrhagic met.  Infarct is most likely is incidental and not causing her generalized weakness.  However, due to the fact that she does have a stroke and in addition she is Covid positive she will need to have a stroke work-up for further evaluation of stroke risk factors.  Impression: -Hemorrhagic brain metastases right temporal lobe -Ischemic cerebellar infarct-likely incidental but worthy of risk factor work-up.  Recommend -CTA head  neck -Transthoracic Echo -NO ASA or Lovenox due to possible bleeding metastasis--ordered --SCD's for DVT Px -Start or continue Atorvastatin 80 mg/other high intensity statin -BP goal: Normotensive with blood pressure no higher than 140 systolically -HBAIC and Lipid profile -Telemetry monitoring -Frequent neuro checks -NPO until passes stroke swallow screen -PT/OT -Oncology and palliative care consultation.  Neuro hospitalist team will follow up with you after results are available.  No need to transfer patient to [facility]-we will follow up in conjunction with the hospitalist and chart review.  --  , MD [facility] Pager: [phone] If 7pm to 7am, please call on call as listed on AMION.     

## 2020-03-11 ENCOUNTER — Inpatient Hospital Stay (HOSPITAL_COMMUNITY): Payer: Medicare Other

## 2020-03-11 DIAGNOSIS — I10 Essential (primary) hypertension: Secondary | ICD-10-CM | POA: Diagnosis not present

## 2020-03-11 LAB — URINE CULTURE

## 2020-03-11 LAB — COMPREHENSIVE METABOLIC PANEL
ALT: 31 U/L (ref 0–44)
AST: 84 U/L — ABNORMAL HIGH (ref 15–41)
Albumin: 2.1 g/dL — ABNORMAL LOW (ref 3.5–5.0)
Alkaline Phosphatase: 106 U/L (ref 38–126)
Anion gap: 9 (ref 5–15)
BUN: 10 mg/dL (ref 8–23)
CO2: 24 mmol/L (ref 22–32)
Calcium: 7.5 mg/dL — ABNORMAL LOW (ref 8.9–10.3)
Chloride: 103 mmol/L (ref 98–111)
Creatinine, Ser: 0.51 mg/dL (ref 0.44–1.00)
GFR, Estimated: >60 mL/min (ref >60)
Glucose, Bld: 138 mg/dL — ABNORMAL HIGH (ref 70–99)
Potassium: 4.1 mmol/L (ref 3.5–5.1)
Sodium: 136 mmol/L (ref 135–145)
Total Bilirubin: 0.7 mg/dL (ref 0.3–1.2)
Total Protein: 5.7 g/dL — ABNORMAL LOW (ref 6.5–8.1)

## 2020-03-11 LAB — CBC WITH DIFFERENTIAL/PLATELET
Abs Immature Granulocytes: 0.01 10*3/uL (ref 0.00–0.07)
Basophils Absolute: 0 10*3/uL (ref 0.0–0.1)
Basophils Relative: 1 %
Eosinophils Absolute: 0.1 10*3/uL (ref 0.0–0.5)
Eosinophils Relative: 2 %
HCT: 31.6 % — ABNORMAL LOW (ref 36.0–46.0)
Hemoglobin: 10.4 g/dL — ABNORMAL LOW (ref 12.0–15.0)
Immature Granulocytes: 0 %
Lymphocytes Relative: 18 %
Lymphs Abs: 0.7 10*3/uL (ref 0.7–4.0)
MCH: 34.9 pg — ABNORMAL HIGH (ref 26.0–34.0)
MCHC: 32.9 g/dL (ref 30.0–36.0)
MCV: 106 fL — ABNORMAL HIGH (ref 80.0–100.0)
Monocytes Absolute: 0.3 10*3/uL (ref 0.1–1.0)
Monocytes Relative: 7 %
Neutro Abs: 2.9 10*3/uL (ref 1.7–7.7)
Neutrophils Relative %: 72 %
Platelets: 113 10*3/uL — ABNORMAL LOW (ref 150–400)
RBC: 2.98 MIL/uL — ABNORMAL LOW (ref 3.87–5.11)
RDW: 15.2 % (ref 11.5–15.5)
WBC: 4 10*3/uL (ref 4.0–10.5)
nRBC: 0 % (ref 0.0–0.2)

## 2020-03-11 LAB — LIPID PANEL
Cholesterol: 124 mg/dL (ref 0–200)
HDL: 14 mg/dL — ABNORMAL LOW (ref >40)
LDL Cholesterol: 86 mg/dL (ref 0–99)
Total CHOL/HDL Ratio: 8.9 RATIO
Triglycerides: 121 mg/dL (ref <150)
VLDL: 24 mg/dL (ref 0–40)

## 2020-03-11 LAB — MAGNESIUM: Magnesium: 2 mg/dL (ref 1.7–2.4)

## 2020-03-11 LAB — PHOSPHORUS: Phosphorus: 2.5 mg/dL (ref 2.5–4.6)

## 2020-03-11 MED ORDER — IOHEXOL 350 MG/ML SOLN
100.0000 mL | Freq: Once | INTRAVENOUS | Status: AC | PRN
  Administered 2020-03-11: 100 mL via INTRAVENOUS

## 2020-03-11 NOTE — Progress Notes (Signed)
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 Patient was brought to the hospital due to generalized weakness. Patient tested positive for COVID in ED. Pt MRI reads: 9 mm acute ischemic nonhemorrhagic left cerebellar infarct, 7 mm focus of susceptibility artifact involving the right temporal lobe concerning for possible hemorrhagic metastasis.   Precautions  Precautions Fall  Precaution Comments monitor sats  Restrictions  Weight Bearing Restrictions No  Home Living  Family/patient expects to be discharged to: Private residence  [facility] ([place])  Available Help at Discharge Family;Available 24 hours/day  Type of Home House  Home Access Level entry  Home Layout One level  Bathroom Shower/Tub Walk-in Patent examiner - 2 wheels;Walker - 4 wheels;BSC;Hospital bed  Prior Function  Level of Independence Independent  Comments patient reports DTR helps her dry off her back after bathing, otherwise independent with self care  Communication  Communication No difficulties  Pain Assessment  Pain Assessment Faces  Faces Pain Scale 4  Pain Location generalized  Pain Descriptors / Indicators Grimacing  Pain Intervention(s) Monitored during session  Cognition  Arousal/Alertness Awake/alert  Behavior During Therapy WFL for tasks assessed/performed  Overall Cognitive Status Within Functional Limits for tasks assessed  Upper Extremity Assessment  Upper Extremity Assessment Generalized weakness;RUE deficits/detail;LUE deficits/detail  RUE Deficits / Details shoulder flexion ~90  RUE Coordination  (opposition intact)  LUE Deficits / Details shoulder flexion ~75  LUE Coordination  (opposition intact)  Lower Extremity Assessment  Lower  Extremity Assessment Defer to PT evaluation  Cervical / Trunk Assessment  Cervical / Trunk Assessment Other exceptions  Cervical / Trunk Exceptions body habitus  ADL  Overall ADL's  Needs assistance/impaired  Eating/Feeding Bed level;Independent  Grooming Set up;Sitting;Oral care  Upper Body Bathing Minimal assistance;Sitting  Lower Body Bathing Maximal assistance;Sitting/lateral leans  Upper Body Dressing  Minimal assistance;Sitting  Lower Body Dressing Total assistance;Sit to/from stand  Lower Body Dressing Details (indicate cue type and reason) to don socks, patient on high gurney   Programmer, systems Details (indicate cue type and reason) min G for safety as patient standing from high gurney, patient able to take steps foward and back with rolling walker, no loss of balance noted   Toileting- Clothing Manipulation and Hygiene Sit to/from stand;Minimal assistance  Functional mobility during ADLs Min guard;Cueing for safety;Rolling walker  General ADL Comments patient requiring increased assistance with self care tasks due to decreased activity tolerance, balance  Bed Mobility  Overal bed mobility Needs Assistance  Bed Mobility Supine to Sit;Sit to Supine  Supine to sit Mod assist;HOB elevated  Sit to supine Mod assist  General bed mobility comments mod A to elevate trunk, patient typically sleeps in hospital bed. Mod A to lift LEs onto gurney  Transfers  Overall transfer level Needs assistance  Equipment used Rolling walker (2 wheeled)  Transfers Sit to/from Stand  Sit to Stand Min guard  General transfer comment min G for safety due to standing from gurney, patient min G with rolling walker to take few steps foward/back no loss of balance noted   Balance  Overall balance assessment Needs assistance  Sitting-balance support Feet unsupported  Sitting balance-Leahy Scale Good  Standing balance support Bilateral upper extremity supported   Standing balance-Leahy  Scale Poor  General Comments  General comments (skin integrity, edema, etc.) VSS on room air  OT - End of Session  Equipment Utilized During Treatment Rolling walker  Activity Tolerance Patient tolerated treatment well  Patient left in bed;with call bell/phone within reach  Nurse Communication Mobility status  OT Assessment  OT Recommendation/Assessment Patient needs continued OT Services  OT Visit Diagnosis Other abnormalities of gait and mobility (R26.89);Muscle weakness (generalized) (M62.81);History of falling (Z91.81)  OT Problem List Decreased strength;Decreased range of motion;Decreased activity tolerance;Impaired balance (sitting and/or standing);Decreased safety awareness;Obesity  OT Plan  OT Frequency (ACUTE ONLY) Min 2X/week  OT Treatment/Interventions (ACUTE ONLY) Self-care/ADL training;Therapeutic exercise;Energy conservation;DME and/or AE instruction;Therapeutic activities;Patient/family education;Balance training  AM-PAC OT "6 Clicks" Daily Activity Outcome Measure (Version 2)  Help from another person eating meals? 4  Help from another person taking care of personal grooming? 3  Help from another person toileting, which includes using toliet, bedpan, or urinal? 3  Help from another person bathing (including washing, rinsing, drying)? 2  Help from another person to put on and taking off regular upper body clothing? 3  Help from another person to put on and taking off regular lower body clothing? 2  6 Click Score 17  OT Recommendation  Follow Up Recommendations Home health OT;Supervision/Assistance - 24 hour  OT Equipment Other (comment)  Individuals Consulted  Consulted and Agree with Results and Recommendations Patient  Acute Rehab OT Goals  Patient Stated Goal go home with daughter  OT Goal Formulation With patient  Time For Goal Achievement 03/24/20  Potential to Achieve Goals Good  OT Time Calculation  OT Start Time (ACUTE ONLY) 1115  OT  Stop Time (ACUTE ONLY) 1156  OT Time Calculation (min) 41 min  OT General Charges  $OT Visit 1 Visit  OT Evaluation  $OT Eval Moderate Complexity 1 Mod  OT Treatments  $Self Care/Home Management  23-37 mins  Written Expression  Dominant Hand Right   Evaluation completed by Delbert Phenix, OT.  Input and signed by Gustavo Lah, OT.

## 2020-03-11 NOTE — Plan of Care (Signed)
Have given pt the "Stroke" booklet. Pt aware that she has had a "small stroke". Family has been alerted via phone.

## 2020-03-11 NOTE — Progress Notes (Addendum)
PROGRESS NOTE    Jasmine Hood  WSF:[id number] DOB: [birth year] DOA: 03/09/2020 PCP: Jasmine Hood   Chief Complaint  Patient presents with  . Emesis    Brief Narrative:  Jasmine Hood is Jasmine Hood 74 y.o. female with medical history significant of metastatic breast cancer, with metastasis to the thoracic spine. Patient was brought to the hospital due to generalized weakness. She lives with her daughter, over the last approximately 5 to 10 days she has been developing progressive generalized weakness. About 2 days ago she got out of the bathroom after having Jasmine Hood shower, on her way to her bedroom she collapsed on the floor. No head trauma or loss of consciousness. She required assistance to get back to her bedroom but she was too weak to stand up, per her report she stayed on the floor for about 48 hours. Today her daughter called EMS and she was transported to the hospital.  She complains of poor appetite, generalized malaise but not fevers or chills. Positive nausea, vomiting and diarrhea.  Denies any sick contacts.  She described her weakness as generalized, persistent, no improving factors, worse with exertion.  Assessment & Plan:   Principal Problem:   Weakness Active Problems:   Hypothyroidism   Essential hypertension   OSA (obstructive sleep apnea)   Malignant neoplasm of overlapping sites of right breast in female, estrogen receptor positive (HCC)   Bone metastases (HCC)   Stroke (HCC)   COVID-19  Generalized Weakness  Likely multifactorial with covid 19 virus infection, L cerebellar infarct and possible hemorrhagic metastasis Continue IVF PT/OT  COVID 19 Virus Infection Vaccinated with J&J maybe about 1 month ago S/p regeneron 10/22 Oxygen sats today in the low 90's to high 80's on RA Continue to monitor closely Given elevated d dimer and tachy, will follow CT PE protocol Prone as able, OOB, IS, flutter.  Therapy.  COVID-19 Labs  Recent Labs     03/10/20 0604  DDIMER 2.90*  CRP 11.9*    Lab Results  Component Value Date   SARSCOV2NAA POSITIVE (Jasmine Hood) 03/09/2020   Jasmine Hood NEGATIVE 11/10/2019   Jasmine Hood NEGATIVE 10/26/2018   Tachycardia: follow EKG, plan for CT PE protocol  L Cerebellar Infarct Neuro c/s, appreciate recs - suspected this was incidental NO asa/lovenox give concern for hemorrhagic metastatsis Per neurology, avoid statin for now CTA head and neck with unchanged punctate focus of blood in posterior r temporal lobe, no emergent large vessel occlusion or high grade stenosis, mild bilateral carotid birfucation atherosclerosis without hemodynamically significant stenosis, multiple ill defined sclerotic calvarial and vertebral lesions compatible with known metastatic disease Echo with EF 70-75% (see report) LE [place] negative for DVT Neuro c/s, appreciate recs PT/OT/SLP  Possible Hemorrhagic Metastasis Hold anticoagulation, neuro following MRI brain with contrast pending per neurology Oncology recommending no immediate intervention, follow up MRI in 2-3 months Neurosurgery recommended follow up imaging Consider palliative care c/s  Metastatic Breast Cancer Follows with Jasmine Hood outpatient Continue anastrozole C/s oncology as above  Dehydration  Hyponatremia: follow with IVF  Pyuria: follow urine cultur -> multiple species  Hypertension: follow  OSA: continue cpap  DVT prophylaxis: SCD Code Status:DNR Family Communication: none at bedside - called daughter, no answer, no answering service set up x2 Disposition:   Status is: Observation  The patient will require care spanning > 2 midnights and should be moved to inpatient because: Inpatient level of care appropriate due to severity of illness  Dispo: The patient is from: Home  Anticipated d/c is to: pending              Anticipated d/c date is: > 3 days              Patient currently is not medically stable to d/c.         Consultants:   Neurology  oncology  Procedures:  Summary:  BILATERAL:  - No evidence of deep vein thrombosis seen in the lower extremities,  bilaterally.  - No evidence of superficial venous thrombosis in the lower extremities,  bilaterally.   Summary:  Right Carotid: Velocities in the right ICA are consistent with Jasmine Hood 1-39%  stenosis.   Left Carotid: Velocities in the left ICA are consistent with Jasmine Hood 1-39%  stenosis.   Vertebrals: Left vertebral artery demonstrates antegrade flow. Right  vertebral       artery was not visualized.   IMPRESSIONS    1. Left ventricular ejection fraction, by estimation, is 70 to 75%. The  left ventricle has hyperdynamic function. The left ventricle has no  regional wall motion abnormalities. Left ventricular diastolic parameters  were normal.  2. Right ventricular systolic function is normal. The right ventricular  size is normal.  3. The mitral valve is normal in structure. Mild mitral valve  regurgitation. No evidence of mitral stenosis.  4. The aortic valve is tricuspid. Aortic valve regurgitation is mild.  Mild aortic valve sclerosis is present, with no evidence of aortic valve  stenosis.  5. The inferior vena cava is normal in size with greater than 50%  respiratory variability, suggesting right atrial pressure of 3 mmHg.    Antimicrobials:  Anti-infectives (From admission, onward)   None         Subjective: Feels tired, hot  Objective: Vitals:   03/11/20 0233 03/11/20 0545 03/11/20 0802 03/11/20 1156  BP: 134/70 126/63 (!) 147/63 124/61  Pulse: 81 95 100 (!) 110  Resp: 18 (!) 21 19 16   Temp: 98.8 F (37.1 C) 98.9 F (37.2 C) 99.7 F (37.6 C) (!) 97.5 F (36.4 C)  TempSrc: Oral Oral Oral Oral  SpO2: 96% 95% (!) 86% 93%  Weight:      Height:        Intake/Output Summary (Last 24 hours) at 03/11/2020 1258 Last data filed at 03/11/2020 1008 Gross per 24 hour  Intake 480 ml  Output 1450 ml  Net -970  ml   Filed Weights   03/09/20 1210  Weight: 102.5 kg    Examination:  General: No acute distress. Cardiovascular: tachycardic, regular rate Lungs: Clear to auscultation bilaterally Abdomen: Soft, nontender, nondistended Neurological: Alert and oriented 3. Moves all extremities 4 . Cranial nerves II through XII grossly intact. Skin: Warm and dry. No rashes or lesions. Extremities: No clubbing or cyanosis. No edema.   Data Reviewed: I have personally reviewed following labs and imaging studies  CBC: Recent Labs  Lab 03/09/20 1245 03/10/20 0604 03/11/20 0408  WBC 9.5 6.8 4.0  NEUTROABS 7.7  --  2.9  HGB 11.9* 11.0* 10.4*  HCT 35.1* 33.4* 31.6*  MCV 104.5* 105.7* 106.0*  PLT 124* 124* 113*    Basic Metabolic Panel: Recent Labs  Lab 03/09/20 1245 03/10/20 0604 03/11/20 0408  NA 133*  --  136  K 4.1  --  4.1  CL 101  --  103  CO2 23  --  24  GLUCOSE 150*  --  138*  BUN 10  --  10  CREATININE 0.60 0.63 0.51  CALCIUM 7.5*  --  7.5*  MG  --   --  2.0  PHOS  --   --  2.5    GFR: Estimated Creatinine Clearance: 63.2 mL/min (by C-G formula based on SCr of 0.51 mg/dL).  Liver Function Tests: Recent Labs  Lab 03/09/20 1245 03/11/20 0408  AST 96* 84*  ALT 33 31  ALKPHOS 105 106  BILITOT 1.2 0.7  PROT 6.8 5.7*  ALBUMIN 2.5* 2.1*    CBG: Recent Labs  Lab 03/09/20 1206  GLUCAP 148*     Recent Results (from the past 240 hour(s))  Respiratory Panel by RT PCR (Flu Chaze Hruska&B, Covid) -     Status: Abnormal   Collection Time: 03/09/20  3:41 PM   Specimen: Nasopharyngeal  Result Value Ref Range Status   SARS Coronavirus 2 by RT PCR POSITIVE (Oval Cavazos) NEGATIVE Final    Comment: RESULT CALLED TO, READ BACK BY AND VERIFIED WITH: R.GROVES AT [place] ON 03/09/20 BY N.THOMPSON (NOTE) SARS-CoV-2 target nucleic acids are DETECTED.  SARS-CoV-2 RNA is generally detectable in upper respiratory specimens  during the acute phase of infection. Positive results are indicative of  the presence of the identified virus, but do not rule out bacterial infection or co-infection with other pathogens not detected by the test. Clinical correlation with patient history and other diagnostic information is necessary to determine patient infection status. The expected result is Negative.  Fact Sheet for Patients:  PinkCheek.be  Fact Sheet for Healthcare Providers: GravelBags.it  This test is not yet approved or cleared by the [place] FDA and  has been authorized for detection and/or diagnosis of SARS-CoV-2 by FDA under an Emergency Use Authorization (EUA).  This EUA will remain in effect (meaning this test  can be used) for the duration of  the COVID-19 declaration under Section 564(b)(1) of the Act, 21 U.S.C. section 360bbb-3(b)(1), unless the authorization is terminated or revoked sooner.      Influenza Georgios Kina by PCR NEGATIVE NEGATIVE Final   Influenza B by PCR NEGATIVE NEGATIVE Final    Comment: (NOTE) The Xpert Xpress SARS-CoV-2/FLU/RSV assay is intended as an aid in  the diagnosis of influenza from Nasopharyngeal swab specimens and  should not be used as Asmara Backs sole basis for treatment. Nasal washings and  aspirates are unacceptable for Xpert Xpress SARS-CoV-2/FLU/RSV  testing.  Fact Sheet for Patients: PinkCheek.be  Fact Sheet for Healthcare Providers: GravelBags.it  This test is not yet approved or cleared by the [place] FDA and  has been authorized for detection and/or diagnosis of SARS-CoV-2 by  FDA under an Emergency Use Authorization (EUA). This EUA will remain  in effect (meaning this test can be used) for the duration of the  Covid-19 declaration under Section 564(b)(1) of the Act, 21  U.S.C. section 360bbb-3(b)(1), unless the authorization is  terminated or revoked. Performed at [facility], [place] [address], [place], [place] [address]   Culture, Urine     Status: Abnormal   Collection Time: 03/09/20  3:41 PM   Specimen: Urine, Clean Catch  Result Value Ref Range Status   Specimen Description   Final    URINE, CLEAN CATCH Performed at [facility], [place] [address], [place], [place] [address]    Special Requests   Final    NONE Performed at [facility], [place] [address] [place], [place] [address]    Culture MULTIPLE SPECIES PRESENT, SUGGEST RECOLLECTION (Brok Stocking)  Final   Report Status 03/11/2020 FINAL  Final         Radiology Studies: CT ANGIO HEAD W OR WO CONTRAST  Result Date: 03/10/2020 CLINICAL DATA:  Stroke follow-up EXAM: CT ANGIOGRAPHY HEAD AND NECK TECHNIQUE: Multidetector CT imaging of the head and neck was performed using the standard protocol during bolus administration of intravenous contrast. Multiplanar CT image reconstructions and MIPs were obtained to evaluate the vascular anatomy. Carotid stenosis measurements (when applicable) are obtained utilizing NASCET criteria, using the distal internal carotid diameter as the denominator. CONTRAST:  139mL OMNIPAQUE IOHEXOL 350 MG/ML SOLN COMPARISON:  Brain MRI 03/09/2020 FINDINGS: CT HEAD FINDINGS Brain: Unchanged punctate focus of blood in the posterior right temporal lobe (7:44). The size and configuration of the ventricles and extra-axial CSF spaces are normal. Skull: Multiple sclerotic calvarial lesions. Sinuses/Orbits: No fluid levels or advanced mucosal thickening of the visualized paranasal sinuses. No mastoid or middle ear effusion. The orbits are normal. CTA NECK FINDINGS SKELETON: Multiple ill-defined sclerotic foci.  No fracture. OTHER NECK: Normal pharynx, larynx and major salivary glands. No cervical lymphadenopathy. Unremarkable thyroid gland. UPPER CHEST: No pneumothorax or pleural effusion. No nodules or masses. AORTIC ARCH: There is no calcific atherosclerosis of the aortic arch. There is  no aneurysm, dissection or hemodynamically significant stenosis of the visualized portion of the aorta. Conventional 3 vessel aortic branching pattern. The visualized proximal subclavian arteries are widely patent. RIGHT CAROTID SYSTEM: No dissection, occlusion or aneurysm. Mild atherosclerotic calcification at the carotid bifurcation without hemodynamically significant stenosis. LEFT CAROTID SYSTEM: No dissection, occlusion or aneurysm. Mild atherosclerotic calcification at the carotid bifurcation without hemodynamically significant stenosis. VERTEBRAL ARTERIES: Left dominant configuration. Both origins are clearly patent. There is no dissection, occlusion or flow-limiting stenosis to the skull base (V1-V3 segments). CTA HEAD FINDINGS POSTERIOR CIRCULATION: --Vertebral arteries: Normal V4 segments. --Inferior cerebellar arteries: Normal. --Basilar artery: Normal. --Superior cerebellar arteries: Normal. --Posterior cerebral arteries (PCA): Normal. ANTERIOR CIRCULATION: --Intracranial internal carotid arteries: Normal. --Anterior cerebral arteries (ACA): Normal. Both A1 segments are present. Patent anterior communicating artery (Chae Oommen-comm). --Middle cerebral arteries (MCA): Normal. VENOUS SINUSES: As permitted by contrast timing, patent. ANATOMIC VARIANTS: None Review of the MIP images confirms the above findings. IMPRESSION: 1. Unchanged punctate focus of blood in the posterior right temporal lobe. 2. No emergent large vessel occlusion or high-grade stenosis of the intracranial or cervical arteries. 3. Mild bilateral carotid bifurcation atherosclerosis without hemodynamically significant stenosis. 4. Multiple ill-defined sclerotic calvarial and vertebral lesions, compatible with known metastatic disease. Electronically Signed   By: Ulyses Jarred M.D.   On: 03/10/2020 23:20   CT Head Wo Contrast  Result Date: 03/09/2020 CLINICAL DATA:  Head trauma, minor. Additional history provided: Patient found down, stage IV  breast cancer with known metastases. EXAM: CT HEAD WITHOUT CONTRAST TECHNIQUE: Contiguous axial images were obtained from the base of the skull through the vertex without intravenous contrast. COMPARISON:  Brain MRI 09/27/2017.  Head CT 02/19/2017. FINDINGS: Brain: Mild generalized cerebral atrophy. There is Chaslyn Eisen 5 mm nodular hyperdensity focus measuring 60 Hounsfield units within the posterior right temporal lobe (series 5, image 43) (series 6, image 11). No demarcated cortical infarct. No extra-axial fluid collection. No midline shift. Partially empty sella turcica. Vascular: No hyperdense vessel. Skull: Multiple known calvarial metastases were better appreciated on the prior brain MRI of 09/27/2017. Sinuses/Orbits: Visualized orbits show no acute finding. Frothy secretions within the right sphenoid sinus. No significant mastoid effusion. These results were called by telephone at the time of interpretation on 03/09/2020 at 2:57 pm to provider  Davonna Belling , who verbally acknowledged these results. IMPRESSION: 5 mm nodular hyperdensity within the posterior right temporal as described. This may reflect Letecia Arps hyperdense metastasis. Laisha Rau small acute parenchymal hemorrhage cannot be excluded. Mild generalized cerebral atrophy. Multiple known calvarial metastases were better appreciated on the prior brain MRI of 09/27/2017. Right sphenoid sinusitis. Electronically Signed   By: Kellie Simmering DO   On: 03/09/2020 14:58   CT ANGIO NECK W OR WO CONTRAST  Result Date: 03/10/2020 CLINICAL DATA:  Stroke follow-up EXAM: CT ANGIOGRAPHY HEAD AND NECK TECHNIQUE: Multidetector CT imaging of the head and neck was performed using the standard protocol during bolus administration of intravenous contrast. Multiplanar CT image reconstructions and MIPs were obtained to evaluate the vascular anatomy. Carotid stenosis measurements (when applicable) are obtained utilizing NASCET criteria, using the distal internal carotid diameter as the  denominator. CONTRAST:  181mL OMNIPAQUE IOHEXOL 350 MG/ML SOLN COMPARISON:  Brain MRI 03/09/2020 FINDINGS: CT HEAD FINDINGS Brain: Unchanged punctate focus of blood in the posterior right temporal lobe (7:44). The size and configuration of the ventricles and extra-axial CSF spaces are normal. Skull: Multiple sclerotic calvarial lesions. Sinuses/Orbits: No fluid levels or advanced mucosal thickening of the visualized paranasal sinuses. No mastoid or middle ear effusion. The orbits are normal. CTA NECK FINDINGS SKELETON: Multiple ill-defined sclerotic foci.  No fracture. OTHER NECK: Normal pharynx, larynx and major salivary glands. No cervical lymphadenopathy. Unremarkable thyroid gland. UPPER CHEST: No pneumothorax or pleural effusion. No nodules or masses. AORTIC ARCH: There is no calcific atherosclerosis of the aortic arch. There is no aneurysm, dissection or hemodynamically significant stenosis of the visualized portion of the aorta. Conventional 3 vessel aortic branching pattern. The visualized proximal subclavian arteries are widely patent. RIGHT CAROTID SYSTEM: No dissection, occlusion or aneurysm. Mild atherosclerotic calcification at the carotid bifurcation without hemodynamically significant stenosis. LEFT CAROTID SYSTEM: No dissection, occlusion or aneurysm. Mild atherosclerotic calcification at the carotid bifurcation without hemodynamically significant stenosis. VERTEBRAL ARTERIES: Left dominant configuration. Both origins are clearly patent. There is no dissection, occlusion or flow-limiting stenosis to the skull base (V1-V3 segments). CTA HEAD FINDINGS POSTERIOR CIRCULATION: --Vertebral arteries: Normal V4 segments. --Inferior cerebellar arteries: Normal. --Basilar artery: Normal. --Superior cerebellar arteries: Normal. --Posterior cerebral arteries (PCA): Normal. ANTERIOR CIRCULATION: --Intracranial internal carotid arteries: Normal. --Anterior cerebral arteries (ACA): Normal. Both A1 segments are  present. Patent anterior communicating artery (Ayham Word-comm). --Middle cerebral arteries (MCA): Normal. VENOUS SINUSES: As permitted by contrast timing, patent. ANATOMIC VARIANTS: None Review of the MIP images confirms the above findings. IMPRESSION: 1. Unchanged punctate focus of blood in the posterior right temporal lobe. 2. No emergent large vessel occlusion or high-grade stenosis of the intracranial or cervical arteries. 3. Mild bilateral carotid bifurcation atherosclerosis without hemodynamically significant stenosis. 4. Multiple ill-defined sclerotic calvarial and vertebral lesions, compatible with known metastatic disease. Electronically Signed   By: Ulyses Jarred M.D.   On: 03/10/2020 23:20   MR BRAIN WO CONTRAST  Result Date: 03/09/2020 CLINICAL DATA:  Initial evaluation for acute nausea, vomiting, weakness. History of breast cancer. EXAM: MRI HEAD WITHOUT CONTRAST TECHNIQUE: Multiplanar, multiecho pulse sequences of the brain and surrounding structures were obtained without intravenous contrast. COMPARISON:  Prior CT from earlier same day as well as previous brain MRI from 09/27/2017. FINDINGS: Brain: Diffuse prominence of the CSF containing spaces compatible generalized age-related cerebral atrophy. Patchy T2/FLAIR hyperintensity within the periventricular and deep white matter both cerebral hemispheres most consistent with chronic small vessel ischemic disease, mild for age, and relatively stable. 9  mm linear focus of restricted diffusion seen involving the left cerebellum, consistent with an acute ischemic infarct (series 5, image 69). No associated hemorrhage or mass effect. Few additional punctate foci of diffusion abnormality involving the right centrum semi ovale noted as well, which could reflect tiny subacute small vessel type infarcts (series 18, images 45, 41). No associated hemorrhage. Gray-white matter differentiation otherwise maintained. No other areas of chronic cortical infarction. 7 mm  focus of susceptibility artifacts seen involving the right temporal lobe, corresponding to prior hyperdensity seen on prior CT (series 15, image 25). This is new as compared to prior MRI from 2019. Subtly increased localized T2/FLAIR signal intensity seen at this location suggesting edema (series 16, image 25). No significant regional mass effect. Given the history of metastatic breast cancer, finding is most concerning for Niomi Valent possible new intracranial metastasis. No other definite metastatic lesions are seen on this noncontrast examination. No midline shift or mass effect. Ventricles stable in size without hydrocephalus. No extra-axial fluid collection. No made of an empty sella with sellar expansion. Midline structures intact. Vascular: Major intracranial vascular flow voids are maintained. Skull and upper cervical spine: Craniocervical junction within normal limits. Few scattered T1 hypointense lesions noted about the calvarium, consistent with known osseous metastatic disease. No visible extra osseous or soft tissue component. No scalp soft tissue abnormality. Sinuses/Orbits: Patient status post bilateral ocular lens replacement. Globes and orbital soft tissues demonstrate no acute finding. Right sphenoid sinus disease noted. Paranasal sinuses are otherwise clear. Trace left mastoid effusion noted, of doubtful significance. Inner ear structures grossly normal. Other: None. IMPRESSION: 1. 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 2. Few additional punctate foci of diffusion abnormality involving the right centrum semi ovale, suspicious for tiny subacute nonhemorrhagic small vessel type infarcts. 3. 7 mm focus of susceptibility artifact involving the right temporal lobe, corresponding to hyperdensity seen on prior CT. Subtly increased localized T2/FLAIR signal intensity at this location without significant regional mass effect. Given the history of metastatic breast cancer, finding is most  concerning for Renaldo Gornick possible hemorrhagic metastasis. If possible, further evaluation with postcontrast imaging may be helpful for further evaluation. Alternatively, Jaquay Morneault short interval follow-up study in 2-3 months to evaluate for interval change may be helpful as well. 4. Scattered T1 hypointense lesions about the calvarium, consistent with known osseous metastatic disease. 5. Age-related cerebral atrophy with mild chronic small vessel ischemic disease. Electronically Signed   By: Jeannine Boga M.D.   On: 03/09/2020 22:15   DG CHEST PORT 1 VIEW  Result Date: 03/10/2020 CLINICAL DATA:  COVID EXAM: PORTABLE CHEST 1 VIEW COMPARISON:  11/10/2019 FINDINGS: Shallow inspiration. Cardiac enlargement. Interstitial changes in the lungs may represent multifocal pneumonia or edema. No pleural effusions. No pneumothorax. Mediastinal contours appear intact. Degenerative changes in the spine. IMPRESSION: Cardiac enlargement. Interstitial changes in the lungs may represent multifocal pneumonia or edema. Electronically Signed   By: Lucienne Capers M.D.   On: 03/10/2020 04:39   DG Abdomen Acute W/Chest  Result Date: 03/09/2020 CLINICAL DATA:  Metastatic breast cancer.  Weakness. EXAM: DG ABDOMEN ACUTE WITH 1 VIEW CHEST COMPARISON:  Chest radiograph 11/10/2019. Abdominal radiographs 11/24/2008. FINDINGS: The cardiomediastinal silhouette is unchanged. Asymmetric opacity is again noted in the right upper lobe consistent with post radiation changes. The interstitial markings are mildly prominent bilaterally without overt edema. No acute airspace consolidation, sizeable pleural effusion, pneumothorax is identified. No acute osseous abnormality is seen. There is no evidence of intraperitoneal free air. Gas is present in  nondilated colon. No dilated loops of bowel are seen to suggest obstruction. IMPRESSION: Nonobstructed bowel gas pattern. Chronic lung changes without evidence of acute cardiopulmonary disease. Electronically  Signed   By: Logan Bores M.D.   On: 03/09/2020 14:32   ECHOCARDIOGRAM COMPLETE  Result Date: 03/10/2020    ECHOCARDIOGRAM REPORT   Patient Name:   TONEA LEIPHART Saunders Date of Exam: 03/10/2020 Medical Rec #:  [id number]      Height:       59.0 in Accession #:    [id number]     Weight:       226.0 lb Date of Birth:  15-Feb-1946      BSA:          1.943 m Patient Age:    63 years       BP:           136/68 mmHg Patient Gender: F              HR:           87 bpm. Exam Location:  Inpatient Procedure: 2D Echo, Cardiac Doppler, Color Doppler and Intracardiac            Opacification Agent Indications:   Stroke 434.91 / I163.9  History:       Patient has no prior history of Echocardiogram examinations.                Stroke.  Sonographer:   Bernadene Person RDCS Referring      [place] Phys: IMPRESSIONS  1. Left ventricular ejection fraction, by estimation, is 70 to 75%. The left ventricle has hyperdynamic function. The left ventricle has no regional wall motion abnormalities. Left ventricular diastolic parameters were normal.  2. Right ventricular systolic function is normal. The right ventricular size is normal.  3. The mitral valve is normal in structure. Mild mitral valve regurgitation. No evidence of mitral stenosis.  4. The aortic valve is tricuspid. Aortic valve regurgitation is mild. Mild aortic valve sclerosis is present, with no evidence of aortic valve stenosis.  5. The inferior vena cava is normal in size with greater than 50% respiratory variability, suggesting right atrial pressure of 3 mmHg. FINDINGS  Left Ventricle: Left ventricular ejection fraction, by estimation, is 70 to 75%. The left ventricle has hyperdynamic function. The left ventricle has no regional wall motion abnormalities. Definity contrast agent was given IV to delineate the left ventricular endocardial borders. The left ventricular internal cavity size was normal in size. There is no left ventricular hypertrophy. Left  ventricular diastolic parameters were normal. Normal left ventricular filling pressure. Right Ventricle: The right ventricular size is normal. No increase in right ventricular wall thickness. Right ventricular systolic function is normal. Left Atrium: Left atrial size was normal in size. Right Atrium: Right atrial size was normal in size. Pericardium: There is no evidence of pericardial effusion. Mitral Valve: The mitral valve is normal in structure. Mild mitral valve regurgitation. No evidence of mitral valve stenosis. Tricuspid Valve: The tricuspid valve is normal in structure. Tricuspid valve regurgitation is not demonstrated. No evidence of tricuspid stenosis. Aortic Valve: The aortic valve is tricuspid. Aortic valve regurgitation is mild. Mild aortic valve sclerosis is present, with no evidence of aortic valve stenosis. Pulmonic Valve: The pulmonic valve was normal in structure. Pulmonic valve regurgitation is not visualized. No evidence of pulmonic stenosis. Aorta: The aortic root is normal in size and structure. Venous: The inferior vena cava is normal in size  with greater than 50% respiratory variability, suggesting right atrial pressure of 3 mmHg. IAS/Shunts: No atrial level shunt detected by color flow Doppler.  LEFT VENTRICLE PLAX 2D LVIDd:         4.20 cm  Diastology LVIDs:         2.50 cm  LV e' medial:    9.03 cm/s LV PW:         0.90 cm  LV E/e' medial:  9.3 LV IVS:        0.90 cm  LV e' lateral:   11.00 cm/s LVOT diam:     2.10 cm  LV E/e' lateral: 7.6 LV SV:         108 LV SV Index:   55 LVOT Area:     3.46 cm  RIGHT VENTRICLE RV S prime:     14.20 cm/s TAPSE (M-mode): 1.6 cm LEFT ATRIUM             Index       RIGHT ATRIUM           Index LA diam:        3.70 cm 1.90 cm/m  RA Area:     10.20 cm LA Vol (A2C):   37.9 ml 19.51 ml/m RA Volume:   17.50 ml  9.01 ml/m LA Vol (A4C):   34.4 ml 17.71 ml/m LA Biplane Vol: 38.3 ml 19.71 ml/m  AORTIC VALVE LVOT Vmax:   129.00 cm/s LVOT Vmean:  99.500  cm/s LVOT VTI:    0.311 m  AORTA Ao Root diam: 2.30 cm Ao Asc diam:  3.10 cm MITRAL VALVE                TRICUSPID VALVE MV Area (PHT): 3.31 cm     TR Peak grad:   21.5 mmHg MV Decel Time: 229 msec     TR Vmax:        232.00 cm/s MV E velocity: 83.90 cm/s MV Trevelle Mcgurn velocity: 103.00 cm/s  SHUNTS MV E/Jalea Bronaugh ratio:  0.81         Systemic VTI:  0.31 m                             Systemic Diam: 2.10 cm Fransico Him Hood Electronically signed by Fransico Him Hood Signature Date/Time: 03/10/2020/2:34:49 PM    Final    VAS US CAROTID  Result Date: 03/10/2020 Carotid Arterial Duplex Study Indications: CVA. Limitations  Today's exam was limited due to the patient's respiratory              variation. Performing Technologist: Abram Sander RVS  Examination Guidelines: Blakeley Margraf complete evaluation includes B-mode imaging, spectral Doppler, color Doppler, and power Doppler as needed of all accessible portions of each vessel. Bilateral testing is considered an integral part of Stephanos Fan complete examination. Limited examinations for reoccurring indications may be performed as noted.  Right Carotid Findings: +----------+--------+--------+--------+------------------+--------+           PSV cm/sEDV cm/sStenosisPlaque DescriptionComments +----------+--------+--------+--------+------------------+--------+ CCA Prox  70      13              heterogenous               +----------+--------+--------+--------+------------------+--------+ CCA Distal75      17              heterogenous               +----------+--------+--------+--------+------------------+--------+ ICA  Prox  128     32      1-39%   heterogenous               +----------+--------+--------+--------+------------------+--------+ ICA Distal94      22                                         +----------+--------+--------+--------+------------------+--------+ ECA       55      12                                          +----------+--------+--------+--------+------------------+--------+ +----------+--------+-------+--------+-------------------+           PSV cm/sEDV cmsDescribeArm Pressure (mmHG) +----------+--------+-------+--------+-------------------+ [id number]                                         +----------+--------+-------+--------+-------------------+ +---------+--------+--------+--------------+ VertebralPSV cm/sEDV cm/sNot identified +---------+--------+--------+--------------+  Left Carotid Findings: +----------+--------+--------+--------+------------------+--------+           PSV cm/sEDV cm/sStenosisPlaque DescriptionComments +----------+--------+--------+--------+------------------+--------+ CCA Prox  95                      heterogenous               +----------+--------+--------+--------+------------------+--------+ CCA Distal82      27              heterogenous               +----------+--------+--------+--------+------------------+--------+ ICA Prox  135     38      1-39%   heterogenous               +----------+--------+--------+--------+------------------+--------+ ICA Distal110     37                                         +----------+--------+--------+--------+------------------+--------+ ECA       120     10                                         +----------+--------+--------+--------+------------------+--------+ +----------+--------+--------+--------+-------------------+           PSV cm/sEDV cm/sDescribeArm Pressure (mmHG) +----------+--------+--------+--------+-------------------+ [id number]                                          +----------+--------+--------+--------+-------------------+ +---------+--------+--+--------+--+---------+ VertebralPSV cm/s66EDV cm/s20Antegrade +---------+--------+--+--------+--+---------+   Summary: Right Carotid: Velocities in the right ICA are consistent with Nataleigh Griffin 1-39% stenosis. Left Carotid:  Velocities in the left ICA are consistent with Arlys Scatena 1-39% stenosis. Vertebrals: Left vertebral artery demonstrates antegrade flow. Right vertebral             artery was not visualized. *See table(s) above for measurements and observations.     Preliminary    VAS [place] LOWER EXTREMITY VENOUS (DVT)  Result Date: 03/10/2020  Lower Venous DVTStudy Indications: Stroke.  Limitations: Pain tolerance. Comparison Study: no prior Performing Technologist: Abram Sander RVS  Examination Guidelines: Ileene Allie complete evaluation includes B-mode imaging, spectral Doppler, color Doppler, and power Doppler as needed of all accessible portions of each vessel. Bilateral testing is considered an integral part of Darell Saputo complete examination. Limited examinations for reoccurring indications may be performed as noted. The reflux portion of the exam is performed with the patient in reverse Trendelenburg.  +---------+---------------+---------+-----------+----------+--------------+ RIGHT    CompressibilityPhasicitySpontaneityPropertiesThrombus Aging +---------+---------------+---------+-----------+----------+--------------+ CFV      Full           Yes      Yes                                 +---------+---------------+---------+-----------+----------+--------------+ SFJ      Full                                                        +---------+---------------+---------+-----------+----------+--------------+ FV Prox  Full                                                        +---------+---------------+---------+-----------+----------+--------------+ FV Mid                  Yes      Yes                                 +---------+---------------+---------+-----------+----------+--------------+ FV Distal               Yes      Yes                                 +---------+---------------+---------+-----------+----------+--------------+ PFV      Full                                                         +---------+---------------+---------+-----------+----------+--------------+ POP      Full           Yes      Yes                                 +---------+---------------+---------+-----------+----------+--------------+ PTV      Full                                                        +---------+---------------+---------+-----------+----------+--------------+ PERO                                                  Not visualized +---------+---------------+---------+-----------+----------+--------------+   +---------+---------------+---------+-----------+----------+--------------+  LEFT     CompressibilityPhasicitySpontaneityPropertiesThrombus Aging +---------+---------------+---------+-----------+----------+--------------+ CFV      Full           Yes      Yes                                 +---------+---------------+---------+-----------+----------+--------------+ SFJ      Full                                                        +---------+---------------+---------+-----------+----------+--------------+ FV Prox  Full                                                        +---------+---------------+---------+-----------+----------+--------------+ FV Mid                  Yes      Yes                                 +---------+---------------+---------+-----------+----------+--------------+ FV Distal               Yes      Yes                                 +---------+---------------+---------+-----------+----------+--------------+ PFV      Full                                                        +---------+---------------+---------+-----------+----------+--------------+ POP      Full           Yes      Yes                                 +---------+---------------+---------+-----------+----------+--------------+ PTV      Full                                                         +---------+---------------+---------+-----------+----------+--------------+ PERO                                                  Not visualized +---------+---------------+---------+-----------+----------+--------------+     Summary: BILATERAL: - No evidence of deep vein thrombosis seen in the lower extremities, bilaterally. - No evidence of superficial venous thrombosis in the lower extremities, bilaterally. -   *See table(s) above for measurements and observations. Electronically signed by Servando Snare Hood on 03/10/2020 at 1:10:27 PM.    Final  Scheduled Meds: .  stroke: mapping our early stages of recovery book   Does not apply Once  . anastrozole  1 mg Oral Daily  . levothyroxine  25 mcg Oral Q0600  . pantoprazole  40 mg Oral Daily   Continuous Infusions: . dextrose 5% lactated ringers 75 mL/hr at 03/11/20 1026     LOS: 1 day    Time spent: over 30 min    Fayrene Helper, Hood [facility]   To contact the attending provider between 7A-7P or the covering provider during after hours 7P-7A, please log into the web site [email or web address] and access using universal [place] password for that web site. If you do not have the password, please call the hospital operator.  03/11/2020, 12:58 PM

## 2020-03-11 NOTE — Progress Notes (Signed)
I was asked by Dr. Florene Glen to comment on Jasmine Hood's case including abnormal MRI of the brain with questionable hemorrhagic metastasis.  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  She is currently on anastrozole only under the care of Dr. [name].  She was hospitalized on March 09, 2020 with generalized weakness and failure to thrive.  Her work-up included CT scan of the brain which showed a 5 mm nodule hyperdensity within the posterior right temporal lobe which could reflect hyperdense metastasis with small acute parenchymal hemorrhage noted.  MRI obtained on March 09, 2020 showed a 9 mm acute ischemic nonhemorrhagic left cerebral infarct with a 7 mm focus of right temporal lobe corresponding to a possible hemorrhagic metastasis.  Her work-up also revealed positive SARS-CoV-2 PCR indicating COVID-19 infection as well.  She is currently followed by neurology and neurosurgery evaluated her imaging studies and no intervention is warranted.   From an oncology standpoint, I see no immediate intervention is needed.  Follow-up MRI in the next 2 to 3 months would be reasonable with consultation with radiation oncology after that to potentially consider treatment if indeed this represents metastatic disease.  In the meantime, supportive management per the primary team is recommended.

## 2020-03-11 NOTE — Progress Notes (Signed)
Chart review follow-up  LDL 86 A1c pending-goal less than 7.  CTA head and neck with no emergent LVO or high-grade stenosis.  Multiple ill-defined sclerotic calvarial and vertebral lesions compatible with known metastatic disease.  Mild bilateral carotid bifurcation atherosclerosis without hemodynamically significant stenosis.  Echo IMPRESSIONS  1. Left ventricular ejection fraction, by estimation, is 70 to 75%. The  left ventricle has hyperdynamic function. The left ventricle has no  regional wall motion abnormalities. Left ventricular diastolic parameters  were normal.  2. Right ventricular systolic function is normal. The right ventricular  size is normal.  3. The mitral valve is normal in structure. Mild mitral valve  regurgitation. No evidence of mitral stenosis.  4. The aortic valve is tricuspid. Aortic valve regurgitation is mild.  Mild aortic valve sclerosis is present, with no evidence of aortic valve  stenosis.  5. The inferior vena cava is normal in size with greater than 50%  respiratory variability, suggesting right atrial pressure of 3 mmHg.   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  I would also avoid statin for now. -MRI brain with contrast to evaluate the metastases further -Oncology consultation for the metastatic disease -Outpatient follow-up with neurology and oncology -Management with COVID-19 infection per primary team  Please call with questions  Plan relayed to Dr. Florene Glen over secure text   -- Amie [place], MD [facility] Pager: [phone] If 7pm to 7am, please call on call as listed on AMION.

## 2020-03-11 NOTE — Evaluation (Signed)
Physical Therapy Evaluation Patient Details Name: Jasmine Hood MRN: [id number] DOB: 1946/03/11 Today's Date: 03/11/2020   History of Present Illness  Patient is a 74 year old female medical history significant of metastatic breast cancer, with metastasis to the thoracic spine. Patient was brought to the hospital due to generalized weakness. Patient tested positive for COVID in ED. Pt MRI reads: 9 mm acute ischemic nonhemorrhagic left cerebellar infarct, 7 mm focus of susceptibility artifact involving the right temporal lobe concerning for possible hemorrhagic metastasis.   Clinical Impression   Pt presents with generalized weakness, impaired sitting and standing balance, decreased activity tolerance vs baseline, and dyspnea on exertion with SpO2 minimum of 89% on RA. Pt to benefit from acute PT to address deficits. Pt ambulated room distance with close guard and use of RW, overall requiring min guard to light assist for mobility. Pt has assist of daughter at home, PT recommending d/c with [facility] services to address deficits post-acutely.  PT to progress mobility as tolerated, and will continue to follow acutely.      Follow Up Recommendations Home health PT;Supervision for mobility/OOB    Equipment Recommendations  None recommended by PT    Recommendations for Other Services       Precautions / Restrictions Precautions Precautions: Fall Precaution Comments: monitor sats Restrictions Weight Bearing Restrictions: No      Mobility  Bed Mobility Overal bed mobility: Needs Assistance Bed Mobility: Supine to Sit;Sit to Supine     Supine to sit: Mod assist Sit to supine: Mod assist   General bed mobility comments: mod assist supine<>sit for trunk and LE management, scooting to and from EOB, and boost up in bed upon return to supine.    Transfers Overall transfer level: Needs assistance Equipment used: Rolling walker (2 wheeled) Transfers: Sit to/from Stand Sit to Stand: Min  guard         General transfer comment: for safety, verbal cuing for hand placement. STS x2, from EOB and toilet.  Ambulation/Gait Ambulation/Gait assistance: Min guard Gait Distance (Feet): 12 Feet (2x12 ft) Assistive device: Rolling walker (2 wheeled) Gait Pattern/deviations: Step-through pattern;Decreased stride length;Trunk flexed Gait velocity: decr   General Gait Details: close guard for safety, verbal cuing for upright posture, placement in RW.  Stairs            Wheelchair Mobility    Modified Rankin (Stroke Patients Only) Modified Rankin (Stroke Patients Only) Pre-Morbid Rankin Score: Moderate disability Modified Rankin: Moderately severe disability     Balance Overall balance assessment: Needs assistance Sitting-balance support: Feet unsupported Sitting balance-Leahy Scale: Good     Standing balance support: Bilateral upper extremity supported Standing balance-Leahy Scale: Poor Standing balance comment: reliant on external assist                             Pertinent Vitals/Pain Pain Assessment: Faces Faces Pain Scale: Hurts a little bit Pain Location: generalized Pain Descriptors / Indicators: Discomfort Pain Intervention(s): Limited activity within patient's tolerance;Monitored during session;Repositioned    Home Living Family/patient expects to be discharged to:: Private residence Living Arrangements: Children (DTR) Available Help at Discharge: Family;Available 24 hours/day Type of Home: House Home Access: Level entry     Home Layout: One level Home Equipment: Walker - 2 wheels;Walker - 4 wheels;Bedside commode;Hospital bed      Prior Function Level of Independence: Independent         Comments: patient reports DTR helps her dry off  her back after bathing, otherwise independent with self care. Pt enjoys sitting on her porch, painting     Hand Dominance   Dominant Hand: Right    Extremity/Trunk Assessment   Upper  Extremity Assessment Upper Extremity Assessment: Defer to OT evaluation    Lower Extremity Assessment Lower Extremity Assessment: Generalized weakness    Cervical / Trunk Assessment Cervical / Trunk Assessment: Kyphotic  Communication   Communication: No difficulties  Cognition Arousal/Alertness: Awake/alert Behavior During Therapy: WFL for tasks assessed/performed Overall Cognitive Status: Within Functional Limits for tasks assessed                                        General Comments General comments (skin integrity, edema, etc.): SpO73min 89% on RA, HRmax observed 113 bpm    Exercises     Assessment/Plan    PT Assessment Patient needs continued PT services  PT Problem List Decreased strength;Decreased mobility;Decreased activity tolerance;Decreased balance;Decreased knowledge of use of DME;Pain;Cardiopulmonary status limiting activity;Obesity       PT Treatment Interventions DME instruction;Therapeutic activities;Gait training;Therapeutic exercise;Patient/family education;Balance training;Functional mobility training;Neuromuscular re-education    PT Goals (Current goals can be found in the Care Plan section)  Acute Rehab PT Goals Patient Stated Goal: go home with daughter PT Goal Formulation: With patient Time For Goal Achievement: 03/25/20 Potential to Achieve Goals: Good    Frequency Min 3X/week   Barriers to discharge        Co-evaluation               AM-PAC PT "6 Clicks" Mobility  Outcome Measure Help needed turning from your back to your side while in a flat bed without using bedrails?: A Little Help needed moving from lying on your back to sitting on the side of a flat bed without using bedrails?: A Lot Help needed moving to and from a bed to a chair (including a wheelchair)?: A Lot Help needed standing up from a chair using your arms (e.g., wheelchair or bedside chair)?: A Little Help needed to walk in hospital room?: A  Little Help needed climbing 3-5 steps with a railing? : A Little 6 Click Score: 16    End of Session   Activity Tolerance: Patient tolerated treatment well;Patient limited by fatigue Patient left: in bed;with call bell/phone within reach;with bed alarm set Nurse Communication: Mobility status PT Visit Diagnosis: Difficulty in walking, not elsewhere classified (R26.2);Other abnormalities of gait and mobility (R26.89)    Time: 4854-6270 PT Time Calculation (min) (ACUTE ONLY): 31 min   Charges:   PT Evaluation $PT Eval Low Complexity: 1 Low PT Treatments $Gait Training: 8-22 mins      Milica Gully E, PT Acute Rehabilitation Services Pager [phone]  Office [phone]   Dacia Capers D Elonda Husky 03/11/2020, 3:44 PM

## 2020-03-12 ENCOUNTER — Other Ambulatory Visit: Payer: Self-pay

## 2020-03-12 DIAGNOSIS — I10 Essential (primary) hypertension: Secondary | ICD-10-CM | POA: Diagnosis not present

## 2020-03-12 MED ORDER — SODIUM CHLORIDE 0.9 % IV BOLUS
500.0000 mL | Freq: Once | INTRAVENOUS | Status: AC
  Administered 2020-03-12: 500 mL via INTRAVENOUS

## 2020-03-12 MED ORDER — ENSURE MAX PROTEIN PO LIQD
11.0000 [oz_av] | Freq: Two times a day (BID) | ORAL | Status: DC
  Administered 2020-03-12 – 2020-03-19 (×10): 11 [oz_av] via ORAL

## 2020-03-12 MED ORDER — KETOROLAC TROMETHAMINE 15 MG/ML IJ SOLN
15.0000 mg | Freq: Once | INTRAMUSCULAR | Status: AC
  Administered 2020-03-12: 15 mg via INTRAVENOUS
  Filled 2020-03-12: qty 1

## 2020-03-12 NOTE — Progress Notes (Signed)
SLP Cancellation Note  Patient Details Name: Jasmine Hood MRN: [id number] DOB: [birth year]   Cancelled treatment:       Reason Eval/Treat Not Completed: Other (comment) (SLP schedule conflicts did not permit SLE completion )   Elvina Sidle, M.S., CCC-SLP 03/12/2020, 1:42 PM

## 2020-03-12 NOTE — Progress Notes (Signed)
   03/12/20 2338  Assess: MEWS Score  Temp (!) 100.4 F (38 C)  BP (!) 134/59  Pulse Rate 91  Resp (!) 30  Level of Consciousness Alert  SpO2 95 %  O2 Device Room Air  Patient Activity (if Appropriate) In bed  Assess: MEWS Score  MEWS Temp 0  MEWS Systolic 0  MEWS Pulse 0  MEWS RR 2  MEWS LOC 0  MEWS Score 2  MEWS Score Color Yellow  Assess: if the MEWS score is Yellow or Red  Were vital signs taken at a resting state? Yes  Focused Assessment No change from prior assessment  Early Detection of Sepsis Score *See Row Information* Low  MEWS guidelines implemented *See Row Information* No, previously yellow, continue vital signs every 4 hours  Escalate  MEWS: Escalate Yellow: discuss with charge nurse/RN and consider discussing with provider and RRT  Notify: Charge Nurse/RN  Name of Charge Nurse/RN Notified [place]  Date Charge Nurse/RN Notified 03/12/20  Time Charge Nurse/RN Notified [id number]  Document  Patient Outcome Stabilized after interventions  Progress note created (see row info) Yes

## 2020-03-12 NOTE — Progress Notes (Signed)
   03/12/20 1958  Assess: MEWS Score  Temp 99.7 F (37.6 C)  BP (!) 180/106  Pulse Rate (!) 125  Resp (!) 30  Level of Consciousness Alert  SpO2 95 %  O2 Device Room Air  Patient Activity (if Appropriate) In bed  Assess: MEWS Score  MEWS Temp 0  MEWS Systolic 0  MEWS Pulse 2  MEWS RR 2  MEWS LOC 0  MEWS Score 4  MEWS Score Color Red  Assess: if the MEWS score is Yellow or Red  Were vital signs taken at a resting state? Yes  Focused Assessment No change from prior assessment  Early Detection of Sepsis Score *See Row Information* Low  MEWS guidelines implemented *See Row Information* Yes  Treat  MEWS Interventions Administered prn meds/treatments;Escalated (See documentation below)  Pain Scale 0-10  Pain Score 5  Pain Type Acute pain  Pain Location Generalized  Pain Descriptors / Indicators Aching;Sore  Pain Frequency Intermittent  Pain Onset Gradual  Patients Stated Pain Goal 2  Pain Intervention(s) Repositioned;Emotional support;MD notified (Comment);Medication (See eMAR)  Interventions Medication (see MAR)  Take Vital Signs  Increase Vital Sign Frequency  Red: Q 1hr X 4 then Q 4hr X 4, if remains red, continue Q 4hrs  Escalate  MEWS: Escalate Red: discuss with charge nurse/RN and provider, consider discussing with RRT  Notify: Charge Nurse/RN  Name of Charge Nurse/RN Notified [place]  Date Charge Nurse/RN Notified 03/12/20  Time Charge Nurse/RN Notified 2000  Notify: Provider  Provider Name/Title [facility]  Date Provider Notified 03/12/20  Time Provider Notified 2015  Notification Type Page  Notification Reason Change in status  Response See new orders  Date of Provider Response 03/12/20  Time of Provider Response 2018

## 2020-03-12 NOTE — Progress Notes (Signed)
Pt complained of being uncomfortable throughout night. NT and writer repositioned pt numerous times and adjusted pillows to make comfortable, which only lasted briefly. PRN med for general aches/ discomfort provided.. Pt remains on room air with o2 sats in 90's. Will continue to monitor

## 2020-03-12 NOTE — Progress Notes (Signed)
   03/12/20 2128  Assess: MEWS Score  Temp (!) 102.4 F (39.1 C) (md paged)  BP (!) 149/69  Pulse Rate (!) 116  Resp (!) 33  Level of Consciousness Alert  SpO2 92 %  O2 Device Room Air  Assess: MEWS Score  MEWS Temp 2  MEWS Systolic 0  MEWS Pulse 2  MEWS RR 2  MEWS LOC 0  MEWS Score 6  MEWS Score Color Red  Assess: if the MEWS score is Yellow or Red  Were vital signs taken at a resting state? Yes  Focused Assessment No change from prior assessment  Early Detection of Sepsis Score *See Row Information* Low  MEWS guidelines implemented *See Row Information* No, previously red, continue vital signs every 4 hours  Treat  Pain Scale 0-10  Pain Score 0  Pain Intervention(s) Repositioned  Escalate  MEWS: Escalate Red: discuss with charge nurse/RN and provider, consider discussing with RRT  Notify: Charge Nurse/RN  Name of Charge Nurse/RN Notified [place]   Date Charge Nurse/RN Notified 03/12/20  Time Charge Nurse/RN Notified 2035  Notify: Provider  Provider Name/Title [facility]  Date Provider Notified 03/12/20  Time Provider Notified 2034  Notification Type Page  Notification Reason Change in status  Response See new orders  Date of Provider Response 03/12/20  Time of Provider Response 2137  Document  Patient Outcome Stabilized after interventions  Progress note created (see row info) Yes

## 2020-03-12 NOTE — Progress Notes (Signed)
   03/12/20 2027  Assess: MEWS Score  Temp 99.7 F (37.6 C)  BP (!) 165/72  Pulse Rate (!) 121  ECG Heart Rate (!) 121  Resp (!) 36  Level of Consciousness Alert  SpO2 93 %  O2 Device Room Air  Patient Activity (if Appropriate) In bed  Assess: MEWS Score  MEWS Temp 0  MEWS Systolic 0  MEWS Pulse 2  MEWS RR 3  MEWS LOC 0  MEWS Score 5  MEWS Score Color Red  Assess: if the MEWS score is Yellow or Red  Were vital signs taken at a resting state? Yes  Focused Assessment No change from prior assessment  Early Detection of Sepsis Score *See Row Information* Low  MEWS guidelines implemented *See Row Information* No, previously red, continue vital signs every 4 hours  Treat  MEWS Interventions Other (Comment) (repaged md with manual bp)  Take Vital Signs  Increase Vital Sign Frequency  Red: Q 1hr X 4 then Q 4hr X 4, if remains red, continue Q 4hrs  Escalate  MEWS: Escalate Red: discuss with charge nurse/RN and provider, consider discussing with RRT  Notify: Charge Nurse/RN  Name of Charge Nurse/RN Notified [place]  Date Charge Nurse/RN Notified 03/12/20  Time Charge Nurse/RN Notified 2040  Notify: Provider  Provider Name/Title [facility]  Date Provider Notified 03/12/20  Time Provider Notified 2043  Notification Type Page  Notification Reason Other (Comment) (followup vitals with manual bp)  Response No new orders  Document  Patient Outcome Stabilized after interventions  Progress note created (see row info) Yes

## 2020-03-12 NOTE — Progress Notes (Signed)
PROGRESS NOTE    Jasmine Hood  FKC:[id number] DOB: 10/09/1945 DOA: 03/09/2020 PCP: Jasmine Hood, Jasmine Halsted, MD   Chief Complaint  Patient presents with  . Emesis    Brief Narrative:  Jasmine Hood is Jasmine Hood 74 y.o. female with medical history significant of metastatic breast cancer, with metastasis to the thoracic spine. Patient was brought to the hospital due to generalized weakness. She lives with her daughter, over the last approximately 5 to 10 days she has been developing progressive generalized weakness. About 2 days ago she got out of the bathroom after having Jasmine Hood shower, on her way to her bedroom she collapsed on the floor. No head trauma or loss of consciousness. She required assistance to get back to her bedroom but she was too weak to stand up, per her report she stayed on the floor for about 48 hours. Today her daughter called EMS and she was transported to the hospital.  She complains of poor appetite, generalized malaise but not fevers or chills. Positive nausea, vomiting and diarrhea.  Denies any sick contacts.  She described her weakness as generalized, persistent, no improving factors, worse with exertion.  Assessment & Plan:   Principal Problem:   Weakness Active Problems:   Hypothyroidism   Essential hypertension   OSA (obstructive sleep apnea)   Malignant neoplasm of overlapping sites of right breast in female, estrogen receptor positive (HCC)   Bone metastases (HCC)   Stroke (HCC)   COVID-19  Generalized Weakness  Likely multifactorial with covid 19 virus infection, L cerebellar infarct and possible hemorrhagic metastasis Continue IVF PT/OT -> recommending home health, supervision for mobility - i'm unable to reach family today to discuss potential d/c plans  COVID 19 Virus Infection Vaccinated with J&J maybe about 1 month ago S/p regeneron 10/22 Oxygen sats today in the low 90's to high 80's on RA - sats maintained >89% yesterday working with  therapy CT with scattered areas of interstitial prominence partially present on prior scan, some of these areas may represent active covid pneumonia, others likely related to chronic dz and prior radiation therapy Continue to monitor closely Given elevated d dimer and tachy, will follow CT PE protocol Prone as able, OOB, IS, flutter.  Therapy.  COVID-19 Labs  Recent Labs    03/10/20 0604  DDIMER 2.90*  CRP 11.9*    Lab Results  Component Value Date   SARSCOV2NAA POSITIVE (Jasmine Hood) 03/09/2020   [place] NEGATIVE 11/10/2019   [place] NEGATIVE 10/26/2018   Tachycardia: follow EKG, plan for CT PE protocol -> negative for central PE  L Cerebellar Infarct Neuro c/s, appreciate recs - suspected this was incidental NO asa/lovenox give concern for hemorrhagic metastatsis Per neurology, avoid statin for now CTA head and neck with unchanged punctate focus of blood in posterior r temporal lobe, no emergent large vessel occlusion or high grade stenosis, mild bilateral carotid birfucation atherosclerosis without hemodynamically significant stenosis, multiple ill defined sclerotic calvarial and vertebral lesions compatible with known metastatic disease Echo with EF 70-75% (see report) LE [place] negative for DVT Neuro c/s, appreciate recs PT/OT/SLP  Possible Hemorrhagic Metastasis Hold anticoagulation, neuro following MRI brain with contrast pending per neurology Oncology recommending no immediate intervention, follow up MRI in 2-3 months Neurosurgery recommended follow up imaging Consider palliative care c/s  Metastatic Breast Cancer Follows with Jasmine Hood outpatient Continue anastrozole C/s oncology as above  Dehydration  Hyponatremia: follow with IVF  Pyuria: follow urine cultur -> multiple species  Hypertension: follow  OSA: continue  cpap  Thrombocytopenia: follow  DVT prophylaxis: SCD Code Status:DNR Family Communication: none at bedside - called daughter Jasmine Hood and  Jasmine Hood, no answers Disposition:   Status is: Observation  The patient will require care spanning > 2 midnights and should be moved to inpatient because: Inpatient level of care appropriate due to severity of illness  Dispo: The patient is from: Home              Anticipated d/c is to: pending              Anticipated d/c date is: > 3 days              Patient currently is not medically stable to d/c.       Consultants:   Neurology  oncology  Procedures:  Summary:  BILATERAL:  - No evidence of deep vein thrombosis seen in the lower extremities,  bilaterally.  - No evidence of superficial venous thrombosis in the lower extremities,  bilaterally.   Summary:  Right Carotid: Velocities in the right ICA are consistent with Jasmine Hood 1-39%  stenosis.   Left Carotid: Velocities in the left ICA are consistent with Jasmine Hood 1-39%  stenosis.   Vertebrals: Left vertebral artery demonstrates antegrade flow. Right  vertebral       artery was not visualized.   IMPRESSIONS    1. Left ventricular ejection fraction, by estimation, is 70 to 75%. The  left ventricle has hyperdynamic function. The left ventricle has no  regional wall motion abnormalities. Left ventricular diastolic parameters  were normal.  2. Right ventricular systolic function is normal. The right ventricular  size is normal.  3. The mitral valve is normal in structure. Mild mitral valve  regurgitation. No evidence of mitral stenosis.  4. The aortic valve is tricuspid. Aortic valve regurgitation is mild.  Mild aortic valve sclerosis is present, with no evidence of aortic valve  stenosis.  5. The inferior vena cava is normal in size with greater than 50%  respiratory variability, suggesting right atrial pressure of 3 mmHg.    Antimicrobials:  Anti-infectives (From admission, onward)   None         Subjective: No new complaints today  Objective: Vitals:   03/12/20 0500 03/12/20 0756 03/12/20 1222  03/12/20 1541  BP:  (!) 124/48 (!) 150/54 127/69  Pulse:  70 86 95  Resp:  17 17 (!) 24  Temp:  98.1 F (36.7 C) (!) 97.5 F (36.4 C) 98.9 F (37.2 C)  TempSrc:    Oral  SpO2:  92% 90% (!) 88%  Weight: 103.7 kg     Height:        Intake/Output Summary (Last 24 hours) at 03/12/2020 1636 Last data filed at 03/12/2020 1015 Gross per 24 hour  Intake 2592.66 ml  Output 600 ml  Net 1992.66 ml   Filed Weights   03/09/20 1210 03/12/20 0500  Weight: 102.5 kg 103.7 kg    Examination:  General: No acute distress. Cardiovascular: Heart sounds show Beuford Garcilazo regular rate, and rhythm. Lungs: Clear to auscultation bilaterally Abdomen: Soft, nontender, nondistended  Neurological: Alert and oriented 3. Moves all extremities 4 . Cranial nerves II through XII grossly intact. Skin: Warm and dry. No rashes or lesions. Extremities: No clubbing or cyanosis. No edema.    Data Reviewed: I have personally reviewed following labs and imaging studies  CBC: Recent Labs  Lab 03/09/20 1245 03/10/20 0604 03/11/20 0408  WBC 9.5 6.8 4.0  NEUTROABS 7.7  --  2.9  HGB 11.9* 11.0* 10.4*  HCT 35.1* 33.4* 31.6*  MCV 104.5* 105.7* 106.0*  PLT 124* 124* 113*    Basic Metabolic Panel: Recent Labs  Lab 03/09/20 1245 03/10/20 0604 03/11/20 0408  NA 133*  --  136  K 4.1  --  4.1  CL 101  --  103  CO2 23  --  24  GLUCOSE 150*  --  138*  BUN 10  --  10  CREATININE 0.60 0.63 0.51  CALCIUM 7.5*  --  7.5*  MG  --   --  2.0  PHOS  --   --  2.5    GFR: Estimated Creatinine Clearance: 63.7 mL/min (by C-G formula based on SCr of 0.51 mg/dL).  Liver Function Tests: Recent Labs  Lab 03/09/20 1245 03/11/20 0408  AST 96* 84*  ALT 33 31  ALKPHOS 105 106  BILITOT 1.2 0.7  PROT 6.8 5.7*  ALBUMIN 2.5* 2.1*    CBG: Recent Labs  Lab 03/09/20 1206  GLUCAP 148*     Recent Results (from the past 240 hour(s))  Respiratory Panel by RT PCR (Flu Amadeo Coke&B, Covid) -     Status: Abnormal   Collection  Time: 03/09/20  3:41 PM   Specimen: Nasopharyngeal  Result Value Ref Range Status   SARS Coronavirus 2 by RT PCR POSITIVE (Edilia Ghuman) NEGATIVE Final    Comment: RESULT CALLED TO, READ BACK BY AND VERIFIED WITH: R.GROVES AT [place] ON 03/09/20 BY N.THOMPSON (NOTE) SARS-CoV-2 target nucleic acids are DETECTED.  SARS-CoV-2 RNA is generally detectable in upper respiratory specimens  during the acute phase of infection. Positive results are indicative of the presence of the identified virus, but do not rule out bacterial infection or co-infection with other pathogens not detected by the test. Clinical correlation with patient history and other diagnostic information is necessary to determine patient infection status. The expected result is Negative.  Fact Sheet for Patients:  PinkCheek.be  Fact Sheet for Healthcare Providers: GravelBags.it  This test is not yet approved or cleared by the [place] FDA and  has been authorized for detection and/or diagnosis of SARS-CoV-2 by FDA under an Emergency Use Authorization (EUA).  This EUA will remain in effect (meaning this test  can be used) for the duration of  the COVID-19 declaration under Section 564(b)(1) of the Act, 21 U.S.C. section 360bbb-3(b)(1), unless the authorization is terminated or revoked sooner.      Influenza Deangela Randleman by PCR NEGATIVE NEGATIVE Final   Influenza B by PCR NEGATIVE NEGATIVE Final    Comment: (NOTE) The Xpert Xpress SARS-CoV-2/FLU/RSV assay is intended as an aid in  the diagnosis of influenza from Nasopharyngeal swab specimens and  should not be used as [place] Nies sole basis for treatment. Nasal washings and  aspirates are unacceptable for Xpert Xpress SARS-CoV-2/FLU/RSV  testing.  Fact Sheet for Patients: PinkCheek.be  Fact Sheet for Healthcare Providers: GravelBags.it  This test is not yet approved or cleared  by the [place] FDA and  has been authorized for detection and/or diagnosis of SARS-CoV-2 by  FDA under an Emergency Use Authorization (EUA). This EUA will remain  in effect (meaning this test can be used) for the duration of the  Covid-19 declaration under Section 564(b)(1) of the Act, 21  U.S.C. section 360bbb-3(b)(1), unless the authorization is  terminated or revoked. Performed at [facility], [place] [address], [place], [place] [address]   Culture, Urine     Status: Abnormal   Collection Time:  03/09/20  3:41 PM   Specimen: Urine, Clean Catch  Result Value Ref Range Status   Specimen Description   Final    URINE, CLEAN CATCH Performed at [facility], [place] [address], [place], [place] [address]    Special Requests   Final    NONE Performed at [facility], [place] [address], [place], [place] [address]    Culture MULTIPLE SPECIES PRESENT, SUGGEST RECOLLECTION (Betsi Crespi)  Final   Report Status 03/11/2020 FINAL  Final         Radiology Studies: CT ANGIO HEAD W OR WO CONTRAST  Result Date: 03/10/2020 CLINICAL DATA:  Stroke follow-up EXAM: CT ANGIOGRAPHY HEAD AND NECK TECHNIQUE: Multidetector CT imaging of the head and neck was performed using the standard protocol during bolus administration of intravenous contrast. Multiplanar CT image reconstructions and MIPs were obtained to evaluate the vascular anatomy. Carotid stenosis measurements (when applicable) are obtained utilizing NASCET criteria, using the distal internal carotid diameter as the denominator. CONTRAST:  163mL OMNIPAQUE IOHEXOL 350 MG/ML SOLN COMPARISON:  Brain MRI 03/09/2020 FINDINGS: CT HEAD FINDINGS Brain: Unchanged punctate focus of blood in the posterior right temporal lobe (7:44). The size and configuration of the ventricles and extra-axial CSF spaces are normal. Skull: Multiple sclerotic calvarial lesions. Sinuses/Orbits: No fluid levels or advanced mucosal  thickening of the visualized paranasal sinuses. No mastoid or middle ear effusion. The orbits are normal. CTA NECK FINDINGS SKELETON: Multiple ill-defined sclerotic foci.  No fracture. OTHER NECK: Normal pharynx, larynx and major salivary glands. No cervical lymphadenopathy. Unremarkable thyroid gland. UPPER CHEST: No pneumothorax or pleural effusion. No nodules or masses. AORTIC ARCH: There is no calcific atherosclerosis of the aortic arch. There is no aneurysm, dissection or hemodynamically significant stenosis of the visualized portion of the aorta. Conventional 3 vessel aortic branching pattern. The visualized proximal subclavian arteries are widely patent. RIGHT CAROTID SYSTEM: No dissection, occlusion or aneurysm. Mild atherosclerotic calcification at the carotid bifurcation without hemodynamically significant stenosis. LEFT CAROTID SYSTEM: No dissection, occlusion or aneurysm. Mild atherosclerotic calcification at the carotid bifurcation without hemodynamically significant stenosis. VERTEBRAL ARTERIES: Left dominant configuration. Both origins are clearly patent. There is no dissection, occlusion or flow-limiting stenosis to the skull base (V1-V3 segments). CTA HEAD FINDINGS POSTERIOR CIRCULATION: --Vertebral arteries: Normal V4 segments. --Inferior cerebellar arteries: Normal. --Basilar artery: Normal. --Superior cerebellar arteries: Normal. --Posterior cerebral arteries (PCA): Normal. ANTERIOR CIRCULATION: --Intracranial internal carotid arteries: Normal. --Anterior cerebral arteries (ACA): Normal. Both A1 segments are present. Patent anterior communicating artery (Riverlyn Kizziah-comm). --Middle cerebral arteries (MCA): Normal. VENOUS SINUSES: As permitted by contrast timing, patent. ANATOMIC VARIANTS: None Review of the MIP images confirms the above findings. IMPRESSION: 1. Unchanged punctate focus of blood in the posterior right temporal lobe. 2. No emergent large vessel occlusion or high-grade stenosis of the  intracranial or cervical arteries. 3. Mild bilateral carotid bifurcation atherosclerosis without hemodynamically significant stenosis. 4. Multiple ill-defined sclerotic calvarial and vertebral lesions, compatible with known metastatic disease. Electronically Signed   By: Ulyses Jarred M.D.   On: 03/10/2020 23:20   CT ANGIO NECK W OR WO CONTRAST  Result Date: 03/10/2020 CLINICAL DATA:  Stroke follow-up EXAM: CT ANGIOGRAPHY HEAD AND NECK TECHNIQUE: Multidetector CT imaging of the head and neck was performed using the standard protocol during bolus administration of intravenous contrast. Multiplanar CT image reconstructions and MIPs were obtained to evaluate the vascular anatomy. Carotid stenosis measurements (when applicable) are obtained utilizing NASCET criteria, using the distal internal carotid diameter as the denominator.  CONTRAST:  182mL OMNIPAQUE IOHEXOL 350 MG/ML SOLN COMPARISON:  Brain MRI 03/09/2020 FINDINGS: CT HEAD FINDINGS Brain: Unchanged punctate focus of blood in the posterior right temporal lobe (7:44). The size and configuration of the ventricles and extra-axial CSF spaces are normal. Skull: Multiple sclerotic calvarial lesions. Sinuses/Orbits: No fluid levels or advanced mucosal thickening of the visualized paranasal sinuses. No mastoid or middle ear effusion. The orbits are normal. CTA NECK FINDINGS SKELETON: Multiple ill-defined sclerotic foci.  No fracture. OTHER NECK: Normal pharynx, larynx and major salivary glands. No cervical lymphadenopathy. Unremarkable thyroid gland. UPPER CHEST: No pneumothorax or pleural effusion. No nodules or masses. AORTIC ARCH: There is no calcific atherosclerosis of the aortic arch. There is no aneurysm, dissection or hemodynamically significant stenosis of the visualized portion of the aorta. Conventional 3 vessel aortic branching pattern. The visualized proximal subclavian arteries are widely patent. RIGHT CAROTID SYSTEM: No dissection, occlusion or aneurysm.  Mild atherosclerotic calcification at the carotid bifurcation without hemodynamically significant stenosis. LEFT CAROTID SYSTEM: No dissection, occlusion or aneurysm. Mild atherosclerotic calcification at the carotid bifurcation without hemodynamically significant stenosis. VERTEBRAL ARTERIES: Left dominant configuration. Both origins are clearly patent. There is no dissection, occlusion or flow-limiting stenosis to the skull base (V1-V3 segments). CTA HEAD FINDINGS POSTERIOR CIRCULATION: --Vertebral arteries: Normal V4 segments. --Inferior cerebellar arteries: Normal. --Basilar artery: Normal. --Superior cerebellar arteries: Normal. --Posterior cerebral arteries (PCA): Normal. ANTERIOR CIRCULATION: --Intracranial internal carotid arteries: Normal. --Anterior cerebral arteries (ACA): Normal. Both A1 segments are present. Patent anterior communicating artery (Reia Viernes-comm). --Middle cerebral arteries (MCA): Normal. VENOUS SINUSES: As permitted by contrast timing, patent. ANATOMIC VARIANTS: None Review of the MIP images confirms the above findings. IMPRESSION: 1. Unchanged punctate focus of blood in the posterior right temporal lobe. 2. No emergent large vessel occlusion or high-grade stenosis of the intracranial or cervical arteries. 3. Mild bilateral carotid bifurcation atherosclerosis without hemodynamically significant stenosis. 4. Multiple ill-defined sclerotic calvarial and vertebral lesions, compatible with known metastatic disease. Electronically Signed   By: Ulyses Jarred M.D.   On: 03/10/2020 23:20   CT ANGIO CHEST PE W OR WO CONTRAST  Result Date: 03/11/2020 CLINICAL DATA:  COVID-19 infection, positive D-dimer, stroke and history of metastatic breast carcinoma. EXAM: CT ANGIOGRAPHY CHEST WITH CONTRAST TECHNIQUE: Multidetector CT imaging of the chest was performed using the standard protocol during bolus administration of intravenous contrast. Multiplanar CT image reconstructions and MIPs were obtained to  evaluate the vascular anatomy. CONTRAST:  190mL OMNIPAQUE IOHEXOL 350 MG/ML SOLN COMPARISON:  CT of the chest on 06/08/2018 FINDINGS: Cardiovascular: Central pulmonary artery delineation and opacification is satisfactory and there is no evidence of significant central pulmonary embolism. Peripheral pulmonary arterial opacification and evaluation is severely limited by respiratory motion. The thoracic aorta is well opacified and demonstrates normal caliber with no evidence of aneurysmal disease or dissection. The heart size is normal. No pericardial fluid identified. No significant calcified coronary artery plaque identified. Mediastinum/Nodes: There are some small scattered mediastinal lymph nodes. The largest measures approximately 10 mm in short axis in the right lower paratracheal region. No overtly enlarged lymph nodes identified. Lungs/Pleura: Lungs demonstrate scattered areas of interstitial prominence bilaterally which were partially present on the prior scan. Some of these areas may represent active COVID pneumonia. Others are likely related to chronic disease and prior radiation therapy. Upper Abdomen: No acute abnormality. Musculoskeletal: Sclerotic metastatic disease involving multiple thoracic vertebral bodies is less prominent compared to the prior CT with some residual areas of mild sclerosis present. No associated pathologic fractures.  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 Review of the MIP images confirms the above findings. IMPRESSION: 1. No evidence of significant central pulmonary embolism. Peripheral pulmonary arterial opacification and evaluation is severely limited by respiratory motion. 2. Scattered areas of interstitial prominence bilaterally which were partially present on the prior scan. Some of these areas may represent active COVID pneumonia. Others are likely related to chronic disease and prior radiation therapy. 3.  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 4. Decreased conspicuity of sclerotic metastatic disease involving multiple thoracic vertebral bodies. Electronically Signed   By: Aletta Edouard M.D.   On: 03/11/2020 13:26        Scheduled Meds: .  stroke: mapping our early stages of recovery book   Does not apply Once  . anastrozole  1 mg Oral Daily  . levothyroxine  25 mcg Oral Q0600  . pantoprazole  40 mg Oral Daily  . Ensure Max Protein  11 oz Oral BID   Continuous Infusions: . dextrose 5% lactated ringers 75 mL/hr at 03/12/20 1600     LOS: 2 days    Time spent: over 30 min    Fayrene Helper, MD [facility]   To contact the attending provider between 7A-7P or the covering provider during after hours 7P-7A, please log into the web site [email or web address] and access using universal [place] password for that web site. If you do not have the password, please call the hospital operator.  03/12/2020, 4:36 PM

## 2020-03-12 NOTE — Progress Notes (Signed)
Initial Nutrition Assessment  DOCUMENTATION CODES:   Morbid obesity  INTERVENTION:   -Ensure MAX Protein po BID, each supplement provides 150 kcal and 30 grams of protein  NUTRITION DIAGNOSIS:   Increased nutrient needs related to acute illness (COVID-19 infection) as evidenced by estimated needs.  GOAL:   Patient will meet greater than or equal to 90% of their needs  MONITOR:   PO intake, Supplement acceptance, Labs, Weight trends, I & O's  REASON FOR ASSESSMENT:   Consult Assessment of nutrition requirement/status  ASSESSMENT:   74 y.o. female with medical history significant of metastatic breast cancer, with metastasis to the thoracic spine. Patient was brought to the hospital due to generalized weakness. Admitted for COVID-19 infection.  COVID-19+ since 10/21.  Patient consumed 0-100% of meals on 10/23. This morning pt ate 25% of breakfast. Pt reports poor appetite and was having N/V and diarrhea PTA.  Will order Ensure Max supplements given active COVID-19 infection.  Per weight records, weights are stable.    Labs reviewed. Medications: D5 infusion  NUTRITION - FOCUSED PHYSICAL EXAM:  Unable to complete  Diet Order:   Diet Order            Diet regular Room service appropriate? Yes; Fluid consistency: Thin  Diet effective now                 EDUCATION NEEDS:   No education needs have been identified at this time  Skin:  Skin Assessment: Reviewed RN Assessment  Last BM:  10/24 -type 2  Height:   Ht Readings from Last 1 Encounters:  03/09/20 4\' 11"  (1.499 m)    Weight:   Wt Readings from Last 1 Encounters:  03/12/20 103.7 kg    BMI:  Body mass index is 46.18 kg/m.  Estimated Nutritional Needs:   Kcal:  1600-1800  Protein:  90-100g  Fluid:  1.8L/day  Clayton Bibles, MS, RD, LDN Inpatient Clinical Dietitian Contact information available via Amion

## 2020-03-12 NOTE — Progress Notes (Signed)
°   03/12/20 2237  Assess: MEWS Score  Temp 100.2 F (37.9 C)  BP (!) 134/59  Pulse Rate (!) 105  Resp (!) 30  SpO2 93 %  O2 Device Room Air  Patient Activity (if Appropriate) In bed  Assess: MEWS Score  MEWS Temp 0  MEWS Systolic 0  MEWS Pulse 1  MEWS RR 2  MEWS LOC 0  MEWS Score 3  MEWS Score Color Yellow  Assess: if the MEWS score is Yellow or Red  Were vital signs taken at a resting state? Yes  Focused Assessment No change from prior assessment  Early Detection of Sepsis Score *See Row Information* Low  MEWS guidelines implemented *See Row Information* No, previously red, continue vital signs every 4 hours  Treat  MEWS Interventions Administered scheduled meds/treatments  Escalate  MEWS: Escalate Yellow: discuss with charge nurse/RN and consider discussing with provider and RRT  Notify: Charge Nurse/RN  Name of Charge Nurse/RN Notified [place]  Date Charge Nurse/RN Notified 03/12/20  Time Charge Nurse/RN Notified 2240  Document  Patient Outcome Stabilized after interventions  Progress note created (see row info) Yes

## 2020-03-13 ENCOUNTER — Inpatient Hospital Stay (HOSPITAL_COMMUNITY): Payer: Medicare Other

## 2020-03-13 ENCOUNTER — Other Ambulatory Visit: Payer: Self-pay | Admitting: Oncology

## 2020-03-13 DIAGNOSIS — I10 Essential (primary) hypertension: Secondary | ICD-10-CM | POA: Diagnosis not present

## 2020-03-13 LAB — CBC WITH DIFFERENTIAL/PLATELET
Abs Immature Granulocytes: 0.01 10*3/uL (ref 0.00–0.07)
Basophils Absolute: 0 10*3/uL (ref 0.0–0.1)
Basophils Relative: 1 %
Eosinophils Absolute: 0 10*3/uL (ref 0.0–0.5)
Eosinophils Relative: 1 %
HCT: 32 % — ABNORMAL LOW (ref 36.0–46.0)
Hemoglobin: 10.5 g/dL — ABNORMAL LOW (ref 12.0–15.0)
Immature Granulocytes: 0 %
Lymphocytes Relative: 17 %
Lymphs Abs: 0.7 10*3/uL (ref 0.7–4.0)
MCH: 35.5 pg — ABNORMAL HIGH (ref 26.0–34.0)
MCHC: 32.8 g/dL (ref 30.0–36.0)
MCV: 108.1 fL — ABNORMAL HIGH (ref 80.0–100.0)
Monocytes Absolute: 0.3 10*3/uL (ref 0.1–1.0)
Monocytes Relative: 8 %
Neutro Abs: 3 10*3/uL (ref 1.7–7.7)
Neutrophils Relative %: 73 %
Platelets: 97 10*3/uL — ABNORMAL LOW (ref 150–400)
RBC: 2.96 MIL/uL — ABNORMAL LOW (ref 3.87–5.11)
RDW: 15.1 % (ref 11.5–15.5)
WBC: 4.1 10*3/uL (ref 4.0–10.5)
nRBC: 0 % (ref 0.0–0.2)

## 2020-03-13 LAB — URINALYSIS, ROUTINE W REFLEX MICROSCOPIC
Bilirubin Urine: NEGATIVE
Glucose, UA: NEGATIVE mg/dL
Ketones, ur: NEGATIVE mg/dL
Nitrite: NEGATIVE
Protein, ur: NEGATIVE mg/dL
Specific Gravity, Urine: 1.008 (ref 1.005–1.030)
pH: 7 (ref 5.0–8.0)

## 2020-03-13 LAB — COMPREHENSIVE METABOLIC PANEL
ALT: 38 U/L (ref 0–44)
AST: 81 U/L — ABNORMAL HIGH (ref 15–41)
Albumin: 2 g/dL — ABNORMAL LOW (ref 3.5–5.0)
Alkaline Phosphatase: 114 U/L (ref 38–126)
Anion gap: 8 (ref 5–15)
BUN: 10 mg/dL (ref 8–23)
CO2: 25 mmol/L (ref 22–32)
Calcium: 7.7 mg/dL — ABNORMAL LOW (ref 8.9–10.3)
Chloride: 102 mmol/L (ref 98–111)
Creatinine, Ser: 0.56 mg/dL (ref 0.44–1.00)
GFR, Estimated: >60 mL/min (ref >60)
Glucose, Bld: 128 mg/dL — ABNORMAL HIGH (ref 70–99)
Potassium: 4.5 mmol/L (ref 3.5–5.1)
Sodium: 135 mmol/L (ref 135–145)
Total Bilirubin: 1 mg/dL (ref 0.3–1.2)
Total Protein: 5.7 g/dL — ABNORMAL LOW (ref 6.5–8.1)

## 2020-03-13 LAB — HEMOGLOBIN A1C
Hgb A1c MFr Bld: 6.5 % — ABNORMAL HIGH (ref 4.8–5.6)
Mean Plasma Glucose: 140 mg/dL

## 2020-03-13 LAB — PHOSPHORUS: Phosphorus: 3.8 mg/dL (ref 2.5–4.6)

## 2020-03-13 LAB — MAGNESIUM: Magnesium: 1.8 mg/dL (ref 1.7–2.4)

## 2020-03-13 MED ORDER — BISACODYL 10 MG RE SUPP: 10.0000 mg | Freq: Every day | RECTAL | Status: DC | PRN

## 2020-03-13 MED ORDER — POLYETHYLENE GLYCOL 3350 17 G PO PACK
17.0000 g | PACK | Freq: Two times a day (BID) | ORAL | Status: DC
  Administered 2020-03-13: 17 g via ORAL
  Filled 2020-03-13 (×8): qty 1

## 2020-03-13 MED ORDER — POLYETHYLENE GLYCOL 3350 17 G PO PACK
17.0000 g | PACK | Freq: Every day | ORAL | Status: DC
  Administered 2020-03-13: 17 g via ORAL
  Filled 2020-03-13: qty 1

## 2020-03-13 MED ORDER — MORPHINE SULFATE (PF) 2 MG/ML IV SOLN: 0.5000 mg | INTRAVENOUS | Status: DC | PRN

## 2020-03-13 MED ORDER — FUROSEMIDE 10 MG/ML IJ SOLN
20.0000 mg | Freq: Once | INTRAMUSCULAR | Status: AC
  Administered 2020-03-13: 20 mg via INTRAVENOUS
  Filled 2020-03-13: qty 2

## 2020-03-13 NOTE — Progress Notes (Signed)
COURTESY NOTE: Appreciate learning of patient's admission. I am providing a summary of her breast cancer history below. Agree with brain MRI this admission as ordered. May continue anastrozole as you are doing.   Breast cancer summary:  73 y.o. [place], [employer] woman status post right breast overlapping sites biopsy 2 and lymph node biopsy 10/07/2016 all positive for an invasive ductal carcinoma, grade 2, estrogen and progesterone receptor positive, HER-2 nonamplified, with an MIB-1 between 15 and 20%.--This is clinical stage IIIB             (a) breast MRI suggests left-sided disease, biopsy 11/18/2016 shows atypical ductal hyperplasia  (1) genetics testing 11/19/2016--Genetic counseling and testing for hereditary cancer syndromes performed on 11/19/2016. Results are negative for pathogenic mutations in 46 genes analyzed by [facility]'s Common Hereditary Cancers Panel. Results are dated 11/26/2016. Genes tested: APC, ATM, AXIN2, BARD1, BMPR1A, BRCA1, BRCA2, BRIP1, CDH1, CDKN2A, CHEK2, CTNNA1, DICER1, EPCAM, GREM1, HOXB13, KIT, MEN1, MLH1, MSH2, MSH3, MSH6, MUTYH, NBN, NF1, NTHL1, PALB2, PDGFRA, PMS2, POLD1, POLE, PTEN, RAD50, RAD51C, RAD51D, SDHA, SDHB, SDHC, SDHD, SMAD4, SMARCA4, STK11, TP53, TSC1, TSC2, and VHL.            (a) A variant of uncertain significance (not clinically actionable) was noted in CTNNA1.    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 There was a 4 cm right breast mass with right axillary lymph nodes and evidence of cirrhosis.  (3) neoadjuvant treatment consisting of cyclophosphamide, methotrexate, and fluorouracil (CMF) chemotherapy every 21 days 6, starting 11/05/2016, last dose 02/18/2017  (4) Bilateral mastectomies on 04/04/2017 left breast: DCIS, 1.2 cm, margins negative, right Breast: IMC predominantly lobular, grade 2, 8 cm, Posterior margin focally  positive, 10/10 lymph noes involved.  Both are ER/PR positive, invasive tumor was HER-2 negative (ratio 1.48).             (a) the patient is status post revision of some of the scar irregularities under Dr. Dillingham 10/28/2018  (5) adjuvant radiation from 06/02/2017-07/10/2017: 1) Right chest wall/ 50.4 Gy in 28 fractions.  2) L-spine/ 30 Gy in 10 fractions  (6) anastrozole started 09/2017             (a) bone density 12/18/2017 showed a T score of -0.7 normal (although scan quality was limited and lumbar spine was not utilized due to advanced degenerative changes. )             (b)  denosumab/Xgeva restarted on 11/28/17, given monthly; changed to every 6 months after January 2020 dose  (7) staging studies:   (a) CT of the chest with contrast and bone scan 06/08/2018 shows stable bony, no visceral disease disease  (b) non-contrast brain MRI 10/21 2021 shows possible 0.7 cm right temporal  Hemorrhagic metastases (also acute 0.9 cm left cerebella stroke)  (c) contrast MRI brain   

## 2020-03-13 NOTE — Progress Notes (Signed)
   03/13/20 1941  Assess: MEWS Score  Temp (!) 102 F (38.9 C)  BP (!) 138/55  Pulse Rate (!) 119  Resp 20  SpO2 94 %  Assess: MEWS Score  MEWS Temp 2  MEWS Systolic 0  MEWS Pulse 2  MEWS RR 0  MEWS LOC 0  MEWS Score 4  MEWS Score Color Red  Assess: if the MEWS score is Yellow or Red  Were vital signs taken at a resting state? Yes  Focused Assessment No change from prior assessment  Early Detection of Sepsis Score *See Row Information* Low  MEWS guidelines implemented *See Row Information* Yes  Treat  MEWS Interventions Administered prn meds/treatments;Escalated (See documentation below)  Pain Scale 0-10  Pain Score 0  Take Vital Signs  Increase Vital Sign Frequency  Red: Q 1hr X 4 then Q 4hr X 4, if remains red, continue Q 4hrs  Escalate  MEWS: Escalate Red: discuss with charge nurse/RN and provider, consider discussing with RRT  Notify: Charge Nurse/RN  Name of Charge Nurse/RN Notified Debbie RN  Date Charge Nurse/RN Notified 03/13/20  Time Charge Nurse/RN Notified 1942  Notify: Provider  Provider Name/Title Sharlet Salina  Date Provider Notified 03/13/20  Time Provider Notified 2000  Notification Type Page  Notification Reason Change in status  Response No new orders  Document  Patient Outcome Stabilized after interventions  Progress note created (see row info) Yes

## 2020-03-13 NOTE — Progress Notes (Signed)
Physical Therapy Treatment Patient Details Name: Jasmine Hood MRN: [id number] DOB: [birth year] Today's Date: 03/13/2020    History of Present Illness Patient is a 74 year old female medical history significant of metastatic breast cancer, with metastasis to the thoracic spine. Patient was brought to the hospital due to generalized weakness. Patient tested positive for COVID in ED. Pt MRI reads: 9 mm acute ischemic nonhemorrhagic left cerebellar infarct, 7 mm focus of susceptibility artifact involving the right temporal lobe concerning for possible hemorrhagic metastasis.     PT Comments    Increased assistance required on today. Mod encouragement required for participation. Observed pt shaking (like experiencing chills) and pretty significant tremor. Pt c/o feeling cold. Once sitting EOB, pt vomited. After this, she was able to stand and take a few side steps using a RW. Assisted pt back to bed at her request. Will continue to follow and progress activity as tolerated. O2 90% on RA during session.    Follow Up Recommendations  Home health PT;Supervision/Assistance - 24 hour;SNF (depending on progress and assistance available at home)     Equipment Recommendations  None recommended by PT    Recommendations for Other Services       Precautions / Restrictions Precautions Precautions: Fall Restrictions Weight Bearing Restrictions: No    Mobility  Bed Mobility Overal bed mobility: Needs Assistance Bed Mobility: Supine to Sit;Sit to Supine     Supine to sit: Max assist;HOB elevated Sit to supine: Mod assist;HOB elevated   General bed mobility comments: Increased time and effort for pt. Assist for trunk and bil LEs. Utilized bedpad for scooting to EOB. Cues for safety, technique.  Transfers Overall transfer level: Needs assistance Equipment used: Rolling walker (2 wheeled) Transfers: Sit to/from Stand Sit to Stand: Mod assist;From elevated surface         General transfer  comment: Assist to power up, stabilize, control descent. Increased time. Cues for safety, technique, hand placement.  Ambulation/Gait Ambulation/Gait assistance: Min assist           General Gait Details: side steps along side of bed with RW. Unsteady. Assist to stabilize pt throughout task. O2 >90% on RA   Stairs             Wheelchair Mobility    Modified Rankin (Stroke Patients Only)       Balance Overall balance assessment: Needs assistance         Standing balance support: Bilateral upper extremity supported Standing balance-Leahy Scale: Poor                              Cognition Arousal/Alertness: Awake/alert Behavior During Therapy: WFL for tasks assessed/performed Overall Cognitive Status: Within Functional Limits for tasks assessed                                        Exercises      General Comments        Pertinent Vitals/Pain Pain Assessment: Faces Pain Location: generalized Pain Descriptors / Indicators: Discomfort Pain Intervention(s): Limited activity within patient's tolerance;Monitored during session;Repositioned    Home Living     Available Help at Discharge: Family;Available 24 hours/day Type of Home: House              Prior Function            PT Goals (  current goals can now be found in the care plan section) Progress towards PT goals: Progressing toward goals    Frequency    Min 3X/week      PT Plan Current plan remains appropriate;Discharge plan needs to be updated    Co-evaluation              AM-PAC PT "6 Clicks" Mobility   Outcome Measure  Help needed turning from your back to your side while in a flat bed without using bedrails?: A Lot Help needed moving from lying on your back to sitting on the side of a flat bed without using bedrails?: A Lot Help needed moving to and from a bed to a chair (including a wheelchair)?: A Lot Help needed standing up from a chair  using your arms (e.g., wheelchair or bedside chair)?: A Lot Help needed to walk in hospital room?: A Lot Help needed climbing 3-5 steps with a railing? : Total 6 Click Score: 11    End of Session Equipment Utilized During Treatment: Gait belt Activity Tolerance: Patient limited by fatigue Patient left: in bed;with call bell/phone within reach;with bed alarm set   PT Visit Diagnosis: Muscle weakness (generalized) (M62.81);Difficulty in walking, not elsewhere classified (R26.2);Unsteadiness on feet (R26.81)     Time: 3005-1102 PT Time Calculation (min) (ACUTE ONLY): 28 min  Charges:  $Gait Training: 8-22 mins $Therapeutic Activity: 8-22 mins                         Doreatha Massed, PT Acute Rehabilitation  Office: [phone] Pager: [phone]

## 2020-03-13 NOTE — Progress Notes (Signed)
PROGRESS NOTE    Jasmine Hood  MOQ:[id number] DOB: 01-Apr-1946 DOA: 03/09/2020 PCP: Jasmine Hood, [name], MD   Chief Complaint  Patient presents with  . Emesis    Brief Narrative:  Jasmine Hood is Jasmine Hood 74 y.o. female with medical history significant of metastatic breast cancer, with metastasis to the thoracic spine. Patient was brought to the hospital due to generalized weakness. She lives with her daughter, over the last approximately 5 to 10 days she has been developing progressive generalized weakness. About 2 days ago she got out of the bathroom after having [name] shower, on her way to her bedroom she collapsed on the floor. No head trauma or loss of consciousness. She required assistance to get back to her bedroom but she was too weak to stand up, per her report she stayed on the floor for about 48 hours. Today her daughter called EMS and she was transported to the hospital.  She complains of poor appetite, generalized malaise but not fevers or chills. Positive nausea, vomiting and diarrhea.  Denies any sick contacts.  She described her weakness as generalized, persistent, no improving factors, worse with exertion.  Assessment & Plan:   Principal Problem:   Weakness Active Problems:   Hypothyroidism   Essential hypertension   OSA (obstructive sleep apnea)   Malignant neoplasm of overlapping sites of right breast in female, estrogen receptor positive (HCC)   Bone metastases ([place])   Stroke (HCC)   COVID-19  Generalized Weakness  Likely multifactorial with covid 19 virus infection, L cerebellar infarct and possible hemorrhagic metastasis Continue IVF PT/OT -> recommending home health, supervision for mobility - i'm unable to reach family today to discuss potential d/c plans  COVID 19 Virus Infection Vaccinated with J&J maybe about 1 month ago S/p regeneron 10/22 Oxygen sats today in the mid to high 90's - sats maintained >89% 10/23 working with therapy CT with  scattered areas of interstitial prominence partially present on prior scan, some of these areas may represent active covid pneumonia, others likely related to chronic dz and prior radiation therapy Continue to monitor closely Given elevated d dimer and tachy, will follow CT PE protocol Prone as able, OOB, IS, flutter.  Therapy.  COVID-19 Labs  No results for input(s): DDIMER, FERRITIN, LDH, CRP in the last 72 hours.  Lab Results  Component Value Date   SARSCOV2NAA POSITIVE ([name]) 03/09/2020   [name] NEGATIVE 11/10/2019   [name] NEGATIVE 10/26/2018   SIRS  Fever  tachycardia: tachycardia resolved, fever 10/24-25 PM.  Follow blood culture, UA/cx, and repeat CXR (with slightly increased interstitial prominence) Suspect 2/2 covid, continue to monitor Will give dose of lasix with CXR findings, follow  L Cerebellar Infarct Neuro c/s, appreciate recs - suspected this was incidental NO asa/lovenox give concern for hemorrhagic metastatsis Per neurology, avoid statin for now CTA head and neck with unchanged punctate focus of blood in posterior r temporal lobe, no emergent large vessel occlusion or high grade stenosis, mild bilateral carotid birfucation atherosclerosis without hemodynamically significant stenosis, multiple ill defined sclerotic calvarial and vertebral lesions compatible with known metastatic disease Echo with EF 70-75% (see report) LE [place] negative for DVT Neuro c/s, appreciate recs - outpatient neuro follow up PT/OT/SLP  Possible Hemorrhagic Metastasis Hold anticoagulation, neuro following MRI brain with contrast pending per neurology Oncology recommending no immediate intervention, follow up MRI in 2-3 months Neurosurgery recommended follow up imaging Consider palliative care c/s  Metastatic Breast Cancer Follows with Dr. [name] outpatient Continue anastrozole  C/s oncology as above  Dehydration  Hyponatremia: follow with IVF  Pyuria: follow urine cultur ->  multiple species  Hypertension: follow  OSA: continue cpap  Thrombocytopenia: follow, downtrending, continue to monitor   DVT prophylaxis: SCD Code Status:DNR Family Communication: none at bedside - catherine 10/25 Disposition:   Status is: Observation  The patient will require care spanning > 2 midnights and should be moved to inpatient because: Inpatient level of care appropriate due to severity of illness  Dispo: The patient is from: Home              Anticipated d/c is to: pending              Anticipated d/c date is: > 3 days              Patient currently is not medically stable to d/c.       Consultants:   Neurology  oncology  Procedures:  Summary:  BILATERAL:  - No evidence of deep vein thrombosis seen in the lower extremities,  bilaterally.  - No evidence of superficial venous thrombosis in the lower extremities,  bilaterally.   Summary:  Right Carotid: Velocities in the right ICA are consistent with [name] 1-39%  stenosis.   Left Carotid: Velocities in the left ICA are consistent with [name] 1-39%  stenosis.   Vertebrals: Left vertebral artery demonstrates antegrade flow. Right  vertebral       artery was not visualized.   IMPRESSIONS    1. Left ventricular ejection fraction, by estimation, is 70 to 75%. The  left ventricle has hyperdynamic function. The left ventricle has no  regional wall motion abnormalities. Left ventricular diastolic parameters  were normal.  2. Right ventricular systolic function is normal. The right ventricular  size is normal.  3. The mitral valve is normal in structure. Mild mitral valve  regurgitation. No evidence of mitral stenosis.  4. The aortic valve is tricuspid. Aortic valve regurgitation is mild.  Mild aortic valve sclerosis is present, with no evidence of aortic valve  stenosis.  5. The inferior vena cava is normal in size with greater than 50%  respiratory variability, suggesting right atrial pressure of  3 mmHg.    Antimicrobials:  Anti-infectives (From admission, onward)   None         Subjective: C/o Some mild abd discomfort Otherwise, feels ok  Objective: Vitals:   03/13/20 0500 03/13/20 0735 03/13/20 1130 03/13/20 1131  BP:  (!) 113/56  (!) 157/83  Pulse:  72  89  Resp:  20  (!) 22  Temp:  98.2 F (36.8 C)  98.4 F (36.9 C)  TempSrc:  Oral  Oral  SpO2:  95% 95% 96%  Weight: 107.8 kg     Height:        Intake/Output Summary (Last 24 hours) at 03/13/2020 1318 Last data filed at 03/13/2020 1221 Gross per 24 hour  Intake 2229 ml  Output 1600 ml  Net 629 ml   Filed Weights   03/09/20 1210 03/12/20 0500 03/13/20 0500  Weight: 102.5 kg 103.7 kg 107.8 kg    Examination:  General: No acute distress. Cardiovascular: Heart sounds show Natally Ribera regular rate, and rhythm. Lungs: Clear to auscultation bilaterally  Abdomen: Soft, mild tenderness to epigastric region, mild distension  Neurological: Alert and oriented 3. Moves all extremities 4. Cranial nerves II through XII grossly intact. Skin: Warm and dry. No rashes or lesions. Extremities: No clubbing or cyanosis. No edema.  Data Reviewed: I have personally reviewed following labs and imaging studies  CBC: Recent Labs  Lab 03/09/20 1245 03/10/20 0604 03/11/20 0408 03/13/20 0615  WBC 9.5 6.8 4.0 4.1  NEUTROABS 7.7  --  2.9 3.0  HGB 11.9* 11.0* 10.4* 10.5*  HCT 35.1* 33.4* 31.6* 32.0*  MCV 104.5* 105.7* 106.0* 108.1*  PLT 124* 124* 113* 97*    Basic Metabolic Panel: Recent Labs  Lab 03/09/20 1245 03/10/20 0604 03/11/20 0408 03/13/20 0615  NA 133*  --  136 135  K 4.1  --  4.1 4.5  CL 101  --  103 102  CO2 23  --  24 25  GLUCOSE 150*  --  138* 128*  BUN 10  --  10 10  CREATININE 0.60 0.63 0.51 0.56  CALCIUM 7.5*  --  7.5* 7.7*  MG  --   --  2.0 1.8  PHOS  --   --  2.5 3.8    GFR: Estimated Creatinine Clearance: 65.2 mL/min (by C-G formula based on SCr of 0.56 mg/dL).  Liver Function  Tests: Recent Labs  Lab 03/09/20 1245 03/11/20 0408 03/13/20 0615  AST 96* 84* 81*  ALT 33 31 38  ALKPHOS 105 106 114  BILITOT 1.2 0.7 1.0  PROT 6.8 5.7* 5.7*  ALBUMIN 2.5* 2.1* 2.0*    CBG: Recent Labs  Lab 03/09/20 1206  GLUCAP 148*     Recent Results (from the past 240 hour(s))  Respiratory Panel by RT PCR (Flu Quinne Pires&B, Covid) -     Status: Abnormal   Collection Time: 03/09/20  3:41 PM   Specimen: Nasopharyngeal  Result Value Ref Range Status   SARS Coronavirus 2 by RT PCR POSITIVE (Drevon Plog) NEGATIVE Final    Comment: RESULT CALLED TO, READ BACK BY AND VERIFIED WITH: R.GROVES AT [place] ON 03/09/20 BY N.THOMPSON (NOTE) SARS-CoV-2 target nucleic acids are DETECTED.  SARS-CoV-2 RNA is generally detectable in upper respiratory specimens  during the acute phase of infection. Positive results are indicative of the presence of the identified virus, but do not rule out bacterial infection or co-infection with other pathogens not detected by the test. Clinical correlation with patient history and other diagnostic information is necessary to determine patient infection status. The expected result is Negative.  Fact Sheet for Patients:  PinkCheek.be  Fact Sheet for Healthcare Providers: GravelBags.it  This test is not yet approved or cleared by the [place] FDA and  has been authorized for detection and/or diagnosis of SARS-CoV-2 by FDA under an Emergency Use Authorization (EUA).  This EUA will remain in effect (meaning this test  can be used) for the duration of  the COVID-19 declaration under Section 564(b)(1) of the Act, 21 U.S.C. section 360bbb-3(b)(1), unless the authorization is terminated or revoked sooner.      Influenza Destony Prevost by PCR NEGATIVE NEGATIVE Final   Influenza B by PCR NEGATIVE NEGATIVE Final    Comment: (NOTE) The Xpert Xpress SARS-CoV-2/FLU/RSV assay is intended as an aid in  the diagnosis of  influenza from Nasopharyngeal swab specimens and  should not be used as Enrigue Hashimi sole basis for treatment. Nasal washings and  aspirates are unacceptable for Xpert Xpress SARS-CoV-2/FLU/RSV  testing.  Fact Sheet for Patients: PinkCheek.be  Fact Sheet for Healthcare Providers: GravelBags.it  This test is not yet approved or cleared by the [place] FDA and  has been authorized for detection and/or diagnosis of SARS-CoV-2 by  FDA under an Emergency Use Authorization (EUA). This EUA will remain  in effect (meaning this test can be used) for the duration of the  Covid-19 declaration under Section 564(b)(1) of the Act, 21  U.S.C. section 360bbb-3(b)(1), unless the authorization is  terminated or revoked. Performed at [facility], [place] [address], [place], [place] [address]   Culture, Urine     Status: Abnormal   Collection Time: 03/09/20  3:41 PM   Specimen: Urine, Clean Catch  Result Value Ref Range Status   Specimen Description   Final    URINE, CLEAN CATCH Performed at [facility], [place] [address], [place], [place] [address]    Special Requests   Final    NONE Performed at [facility], [place] [address], [place], [place] [address]    Culture MULTIPLE SPECIES PRESENT, SUGGEST RECOLLECTION (Tobenna Needs)  Final   Report Status 03/11/2020 FINAL  Final         Radiology Studies: DG CHEST PORT 1 VIEW  Result Date: 03/13/2020 CLINICAL DATA:  Shortness of breath, COVID positive EXAM: PORTABLE CHEST 1 VIEW COMPARISON:  03/10/2020 FINDINGS: Portion of the left lung base is excluded. Slightly increased interstitial prominence. No significant pleural effusion. No pneumothorax. Stable cardiomediastinal contours. IMPRESSION: Slightly increased interstitial prominence, which may reflect worsening edema or pneumonia. Electronically Signed   By: Macy Mis M.D.   On: 03/13/2020 08:51         Scheduled Meds: .  stroke: mapping our early stages of recovery book   Does not apply Once  . anastrozole  1 mg Oral Daily  . levothyroxine  25 mcg Oral Q0600  . pantoprazole  40 mg Oral Daily  . polyethylene glycol  17 g Oral Daily  . Ensure Max Protein  11 oz Oral BID   Continuous Infusions:    LOS: 3 days    Time spent: over 30 min    Fayrene Helper, MD [facility]   To contact the attending provider between 7A-7P or the covering provider during after hours 7P-7A, please log into the web site [email or web address] and access using universal [place] password for that web site. If you do not have the password, please call the hospital operator.  03/13/2020, 1:18 PM

## 2020-03-13 NOTE — Care Management Important Message (Signed)
Important Message  Patient Details IM Letter placed in door cubby to be presented to the Patient Name: Jasmine Hood MRN: [id number] Date of Birth: 08-27-1945   Medicare Important Message Given:  Yes     Kerin Salen 03/13/2020, 11:49 AM

## 2020-03-13 NOTE — Evaluation (Signed)
Speech Language Pathology Evaluation Patient Details Name: Jasmine Hood MRN: [id number] DOB: [birth year] Today's Date: 03/13/2020 Time: 9147-8295 SLP Time Calculation (min) (ACUTE ONLY): 25 min  Problem List:  Patient Active Problem List   Diagnosis Date Noted  . Stroke ([place]) 03/10/2020  . COVID-19 03/10/2020  . Sepsis ([place]) 11/10/2019  . Changing skin lesion 11/07/2018  . History of right breast cancer 06/05/2018  . Acquired absence of breast and nipple, bilateral 06/05/2018  . Cellulitis 06/09/2017  . Acute lower UTI 02/20/2017  . Elevated lactic acid level   . Urinary tract infection without hematuria   . Weakness   . Generalized weakness 02/19/2017  . AKI (acute kidney injury) ([place]) 02/12/2017  . Dehydration 02/12/2017  . Abnormal TSH 02/12/2017  . Genetic testing 11/27/2016  . Port catheter in place 11/14/2016  . Bone metastases ([place]) 11/11/2016  . Morbid obesity with BMI of 50.0-59.9, adult ([place]) 11/05/2016  . Transaminitis 11/05/2016  . Malignant neoplasm of overlapping sites of right breast in female, estrogen receptor positive ([place]) 10/21/2016  . Encounter for follow-up surveillance of colon cancer   . Benign neoplasm of ascending colon   . Benign neoplasm of descending colon   . Benign neoplasm of transverse colon   . Benign neoplasm of cecum   . OSA (obstructive sleep apnea) 09/14/2012  . Contact dermatitis and eczema due to plant 12/31/2011  . Hypothyroidism 02/01/2010  . Essential hypertension 01/05/2009  . History of colonic polyps 01/05/2009   Past Medical History:  Past Medical History:  Diagnosis Date  . Breast cancer ([place]) 10/07/2016   right breast, spine  . Brown recluse spider bite    right leg  . COLONIC POLYPS, HX OF 01/05/2009   Qualifier: Diagnosis of  By: [name]  MD, [name]   . Complication of anesthesia    slow to awaken after wisdom  teeth extraction age 74  . Constipation   . Genetic testing 11/27/2016   Ms. Ignasiak underwent  genetic counseling and testing for hereditary cancer syndromes on 11/19/2016. Her results were negative for mutations in all 46 genes analyzed by [facility]'s 46-gene Common Hereditary Cancers Panel. Genes analyzed include: APC, ATM, AXIN2, BARD1, BMPR1A, BRCA1, BRCA2, BRIP1, CDH1, CDKN2A, CHEK2, CTNNA1, DICER1, EPCAM, GREM1, HOXB13, KIT, MEN1, MLH1, MSH2, MSH3, MSH6, MUTYH, NBN,  . Hypothyroidism   . Morbid obesity ([place]) 01/05/2009   Qualifier: Diagnosis of  By: [name]  MD, [name]   . Pneumonia 1987  . Sleep apnea    no cpap yet, has not recieved results from home test yet  . Stroke [facility])    found on CT scan, no deficits   Past Surgical History:  Past Surgical History:  Procedure Laterality Date  . BILATERAL TOTAL MASTECTOMY WITH AXILLARY LYMPH NODE DISSECTION Bilateral 04/04/2017   Procedure: RIGHT MASTECTOMY WITH TARGETED RIGHT AXILLARY NODE DISSECTION AND LEFT PROPHYLATIC MASTECTOMY ERAS PATHWAY;  Surgeon: Alphonsa Overall, MD;  Location: [facility];  Service: General;  Laterality: Bilateral;  PEC BLOCK  . BREAST BIOPSY Right 10/07/2016   invasive mammary carcinoma  . BREAST BIOPSY Left 10/08/2016   breast fibroadenoma no malignancy  . BREAST REDUCTION SURGERY Bilateral 10/28/2018   Procedure: Excision of bilateral excess breast tissue;  Surgeon: [name], DO;  Location: [facility];  Service: Plastics;  Laterality: Bilateral;  2 hours, please.  Marland Kitchen CATARACT EXTRACTION Bilateral   . COLONOSCOPY W/ BIOPSIES AND POLYPECTOMY  2012  . COLONOSCOPY WITH PROPOFOL N/A 12/22/2015   Procedure: COLONOSCOPY  WITH PROPOFOL;  Surgeon: [name], MD;  Location: [facility];  Service: Endoscopy;  Laterality: N/A;  . INCISION AND DRAINAGE Right 1998   surgery for spider bite on RLE; "[name]"  . MASTECTOMY Left 04/04/2017   [name]  . MASTECTOMY WITH AXILLARY LYMPH NODE DISSECTION Right 04/04/2017  . MOLE REMOVAL  10/29/2016   Procedure: MOLE REMOVAL LEFT CHEST;   Surgeon: Alphonsa Overall, MD;  Location: [facility];  Service: General;;  . PORT-A-CATH REMOVAL Left 08/05/2018   Procedure: REMOVAL PORT-A-CATH;  Surgeon: [name], DO;  Location: [facility];  Service: Plastics;  Laterality: Left;  . PORTACATH PLACEMENT N/A 10/29/2016   Procedure: INSERTION PORT-A-CATH;  Surgeon: Alphonsa Overall, MD;  Location: [facility];  Service: General;  Laterality: N/A;  . TUBAL LIGATION    . WISDOM TOOTH EXTRACTION  age 74   HPI:  74yo female admitted 03/09/20 with generalized weakness. PMH: breast cancer with mets to thoracic spine. bilateral mastectomy. MRI 10/21 = left cerebellar infarct. Second MRI pending. BSE 9/18 - reg/thin, no f/u   Assessment / Plan / Recommendation Clinical Impression  Pt was seen for a cognitive-linguistic evaluation. She reports having a college education from [employer]. She lives with her eldest daughter, and she manages pt medications and finances.The [place] Mental Status (SLUMS) Examination was administered. Pt scored 19/30, indicating neurocognitive deficits. Points were lost on orientation (year), mental math, thought organization, delayed recall, digit reversal, and auditory attention/recall. These deficits raise concern for pt safety. 24 hour supervision is recommended at DC, with consideration of home health therapy to maximize pt safety and decrease caregiver burden. No further acute ST intervention recommended at this time.    SLP Assessment  SLP Recommendation/Assessment: All further Speech Language Pathology needs can be addressed in the next venue of care  SLP Visit Diagnosis: Cognitive communication deficit (R41.841)    Follow Up Recommendations  Home health SLP       SLP Evaluation Cognition  Overall Cognitive Status: No family/caregiver present to determine baseline cognitive functioning Arousal/Alertness: Awake/alert Orientation Level: Oriented to person;Oriented to place        Comprehension  Auditory Comprehension Overall Auditory Comprehension: Appears within functional limits for tasks assessed    Expression Expression Primary Mode of Expression: Verbal Verbal Expression Overall Verbal Expression: Appears within functional limits for tasks assessed   Oral / Motor  Oral Motor/Sensory Function Overall Oral Motor/Sensory Function: Within functional limits Motor Speech Overall Motor Speech: Appears within functional limits for tasks assessed   GO                   Jennine Peddy B. Quentin Ore, [facility], [place] Speech Language Pathologist Office: [phone] Pager: [phone]  Shonna Chock 03/13/2020, 2:02 PM

## 2020-03-14 ENCOUNTER — Inpatient Hospital Stay (HOSPITAL_COMMUNITY): Payer: Medicare Other

## 2020-03-14 DIAGNOSIS — I10 Essential (primary) hypertension: Secondary | ICD-10-CM | POA: Diagnosis not present

## 2020-03-14 LAB — BLOOD CULTURE ID PANEL (REFLEXED) - BCID2

## 2020-03-14 LAB — CBC WITH DIFFERENTIAL/PLATELET
Abs Immature Granulocytes: 0.03 10*3/uL (ref 0.00–0.07)
Basophils Absolute: 0 10*3/uL (ref 0.0–0.1)
Basophils Relative: 0 %
Eosinophils Absolute: 0 10*3/uL (ref 0.0–0.5)
Eosinophils Relative: 1 %
HCT: 31.2 % — ABNORMAL LOW (ref 36.0–46.0)
Hemoglobin: 10.2 g/dL — ABNORMAL LOW (ref 12.0–15.0)
Immature Granulocytes: 1 %
Lymphocytes Relative: 21 %
Lymphs Abs: 0.9 10*3/uL (ref 0.7–4.0)
MCH: 35.1 pg — ABNORMAL HIGH (ref 26.0–34.0)
MCHC: 32.7 g/dL (ref 30.0–36.0)
MCV: 107.2 fL — ABNORMAL HIGH (ref 80.0–100.0)
Monocytes Absolute: 0.4 10*3/uL (ref 0.1–1.0)
Monocytes Relative: 10 %
Neutro Abs: 2.8 10*3/uL (ref 1.7–7.7)
Neutrophils Relative %: 67 %
Platelets: 100 10*3/uL — ABNORMAL LOW (ref 150–400)
RBC: 2.91 MIL/uL — ABNORMAL LOW (ref 3.87–5.11)
RDW: 15.2 % (ref 11.5–15.5)
WBC: 4.1 10*3/uL (ref 4.0–10.5)
nRBC: 0 % (ref 0.0–0.2)

## 2020-03-14 LAB — COMPREHENSIVE METABOLIC PANEL
ALT: 40 U/L (ref 0–44)
AST: 77 U/L — ABNORMAL HIGH (ref 15–41)
Albumin: 2.2 g/dL — ABNORMAL LOW (ref 3.5–5.0)
Alkaline Phosphatase: 127 U/L — ABNORMAL HIGH (ref 38–126)
Anion gap: 9 (ref 5–15)
BUN: 10 mg/dL (ref 8–23)
CO2: 27 mmol/L (ref 22–32)
Calcium: 7.8 mg/dL — ABNORMAL LOW (ref 8.9–10.3)
Chloride: 100 mmol/L (ref 98–111)
Creatinine, Ser: 0.52 mg/dL (ref 0.44–1.00)
GFR, Estimated: >60 mL/min (ref >60)
Glucose, Bld: 125 mg/dL — ABNORMAL HIGH (ref 70–99)
Potassium: 3.7 mmol/L (ref 3.5–5.1)
Sodium: 136 mmol/L (ref 135–145)
Total Bilirubin: 0.8 mg/dL (ref 0.3–1.2)
Total Protein: 5.9 g/dL — ABNORMAL LOW (ref 6.5–8.1)

## 2020-03-14 LAB — MAGNESIUM: Magnesium: 1.8 mg/dL (ref 1.7–2.4)

## 2020-03-14 LAB — PHOSPHORUS: Phosphorus: 3.7 mg/dL (ref 2.5–4.6)

## 2020-03-14 LAB — BRAIN NATRIURETIC PEPTIDE: B Natriuretic Peptide: 278.4 pg/mL — ABNORMAL HIGH (ref 0.0–100.0)

## 2020-03-14 MED ORDER — GADOBUTROL 1 MMOL/ML IV SOLN
10.0000 mL | Freq: Once | INTRAVENOUS | Status: AC | PRN
  Administered 2020-03-14: 10 mL via INTRAVENOUS

## 2020-03-14 MED ORDER — SODIUM CHLORIDE 0.9 % IV SOLN
2.0000 g | INTRAVENOUS | Status: DC
  Administered 2020-03-14 – 2020-03-15 (×2): 2 g via INTRAVENOUS
  Filled 2020-03-14 (×2): qty 2

## 2020-03-14 NOTE — Progress Notes (Signed)
Occupational Therapy Treatment Patient Details Name: Jasmine Hood MRN: [id number] DOB: March 18, 1946 Today's Date: 03/14/2020    History of present illness Patient is a 74 year old female medical history significant of metastatic breast cancer, with metastasis to the thoracic spine. Patient was brought to the hospital due to generalized weakness. Patient tested positive for COVID in ED. Pt MRI reads: 9 mm acute ischemic nonhemorrhagic left cerebellar infarct, 7 mm focus of susceptibility artifact involving the right temporal lobe concerning for possible hemorrhagic metastasis.    OT comments  Patient agreeable to OOB activity. Mod A for trunk support to sit upright EOB. Patient min G with cues for hand placement to stand from EOB and ambulate to toilet with rolling walker. Patient require cues to hold grab bars to stabilize during transfer, total A for perianal care in standing and supervision standing sink side to perform g/h. Patient continues to endorse wanting to go home with DTR assist "she helps me a lot" at baseline. O2 maintained 90-93% on room air throughout session.    Follow Up Recommendations  Home health OT;Supervision/Assistance - 24 hour    Equipment Recommendations  None recommended by OT       Precautions / Restrictions Precautions Precautions: Fall Precaution Comments: monitor sats       Mobility Bed Mobility Overal bed mobility: Needs Assistance Bed Mobility: Supine to Sit;Sit to Supine     Supine to sit: Mod assist Sit to supine: Mod assist   General bed mobility comments: patient able to mobilize LEs to EOB with mod A to elevate trunk, mod A to lift LEs onto bed  Transfers Overall transfer level: Needs assistance Equipment used: Rolling walker (2 wheeled) Transfers: Sit to/from Stand Sit to Stand: Min guard         General transfer comment: min G for safety, mod cues for hand placement not to pull on walker    Balance Overall balance assessment:  Needs assistance Sitting-balance support: Feet supported Sitting balance-Leahy Scale: Good     Standing balance support: Bilateral upper extremity supported Standing balance-Leahy Scale: Poor Standing balance comment: reliant on external assist                           ADL either performed or assessed with clinical judgement   ADL Overall ADL's : Needs assistance/impaired     Grooming: Wash/dry hands;Wash/dry face;Oral care;Supervision/safety;Set up;Standing Grooming Details (indicate cue type and reason): patient tolerate standing sink side to wash face/hands and brush teeth requiring assist to put tooth paste on brush. leans onto sink for support, no loss of balancen oted              Lower Body Dressing: Total assistance;Bed level Lower Body Dressing Details (indicate cue type and reason): to don socks, patient reports DTR helps with this at home Toilet Transfer: Min guard;Cueing for safety;Cueing for sequencing;Ambulation;Regular Toilet;Grab bars;RW [employer] forces technical [employer] Details (indicate cue type and reason): cues to hold onto grab bars with eccentric lowering onto toilet and to pull from g.b. to stand Toileting- Clothing Manipulation and Hygiene: Total assistance;Sit to/from stand Toileting - Clothing Manipulation Details (indicate cue type and reason): perianal care after bowel movement     Functional mobility during ADLs: Min guard;Rolling walker;Cueing for safety;Cueing for sequencing General ADL Comments: patient requires increased time with functional ambulation and use of rolling walker for stability, patient reports still wanting to go home with DTRs assist "she helps me a lot"  Cognition Arousal/Alertness: Awake/alert Behavior During Therapy: WFL for tasks assessed/performed Overall Cognitive Status: Within Functional Limits for tasks assessed                                                General Comments patient  maintain 90-93% on room air throughout activity    Pertinent Vitals/ Pain       Pain Assessment: Faces Faces Pain Scale: Hurts little more Pain Location: buttock Pain Descriptors / Indicators: Sore;Grimacing Pain Intervention(s): Monitored during session         Frequency  Min 2X/week        Progress Toward Goals  OT Goals(current goals can now be found in the care plan section)  Progress towards OT goals: Progressing toward goals  Acute Rehab OT Goals Patient Stated Goal: go home with daughter OT Goal Formulation: With patient Time For Goal Achievement: 03/24/20 Potential to Achieve Goals: Good  Plan Discharge plan remains appropriate       AM-PAC OT "6 Clicks" Daily Activity     Outcome Measure   Help from another person eating meals?: None Help from another person taking care of personal grooming?: A Little Help from another person toileting, which includes using toliet, bedpan, or urinal?: A Little Help from another person bathing (including washing, rinsing, drying)?: A Lot Help from another person to put on and taking off regular upper body clothing?: A Little Help from another person to put on and taking off regular lower body clothing?: A Lot 6 Click Score: 17    End of Session Equipment Utilized During Treatment: Rolling walker  OT Visit Diagnosis: Other abnormalities of gait and mobility (R26.89);Muscle weakness (generalized) (M62.81);History of falling (Z91.81)   Activity Tolerance Patient tolerated treatment well   Patient Left in bed;with call bell/phone within reach   Nurse Communication Mobility status        Time: 0165-5374 OT Time Calculation (min): 31 min  Charges: OT General Charges $OT Visit: 1 Visit OT Treatments $Self Care/Home Management : 23-37 mins  Delbert Phenix OT OT pager: [phone]   Rosemary Holms 03/14/2020, 12:15 PM

## 2020-03-14 NOTE — Consult Note (Signed)
[facility]  Total days of antibiotics 2/ceftriaxone               Reason for Consult: streptococcal bacteremia    Referring Physician: powell  Principal Problem:   Weakness Active Problems:   Hypothyroidism   Essential hypertension   OSA (obstructive sleep apnea)   Malignant neoplasm of overlapping sites of right breast in female, estrogen receptor positive ([place])   Bone metastases ([place])   Stroke ([facility])   COVID-19    HPI: Jasmine Hood is a 74 y.o. female with hx of breast ca, HTN, who was admitted on 10/21 for weakness, but also now reports having chills for roughly a week after exposure to her daughter who had covid. She denied significant respiratory symptoms. On work up, she had chest CTA that ruled out PE but did find covid pneumonia findings. MRI of brain showed aucte ischemic non hemorrahgic left cerebellar infarct with other small tiny subacute nonhemorrhagic small vessel type infarcts. She did have 1 lesions that showed right temporal lob with localized hyperdensity concern for hemorrhagic metastasis. She started to have fevers in the last 48hrs for which she had blood cx drawn. Only able to do 1 blood set- for which 2/2 bottles positive for GPCs. Strep species by BCID. ID consulted for management and work up. Positive blood cx technically identified on hospital day 5 of admission.  Past Medical History:  Diagnosis Date  . Breast cancer ([place]) 10/07/2016   right breast, spine  . Brown recluse spider bite    right leg  . COLONIC POLYPS, HX OF 01/05/2009   Qualifier: Diagnosis of  By: [name]  MD, [name]   . Complication of anesthesia    slow to awaken after wisdom  teeth extraction age 72  . Constipation   . Genetic testing 11/27/2016   Ms. Dismuke underwent genetic counseling and testing for hereditary cancer syndromes on 11/19/2016. Her results were negative for mutations in all 46 genes analyzed by [facility]'s 46-gene Common Hereditary Cancers  Panel. Genes analyzed include: APC, ATM, AXIN2, BARD1, BMPR1A, BRCA1, BRCA2, BRIP1, CDH1, CDKN2A, CHEK2, CTNNA1, DICER1, EPCAM, GREM1, HOXB13, KIT, MEN1, MLH1, MSH2, MSH3, MSH6, MUTYH, NBN,  . Hypothyroidism   . Morbid obesity ([place]) 01/05/2009   Qualifier: Diagnosis of  By: [name]  MD, [name]   . Pneumonia 1987  . Sleep apnea    no cpap yet, has not recieved results from home test yet  . Stroke [facility])    found on CT scan, no deficits    Allergies:  Allergies  Allergen Reactions  . Bee Venom Anaphylaxis  . Latex Swelling    Current antibiotics:   MEDICATIONS: .  stroke: mapping our early stages of recovery book   Does not apply Once  . anastrozole  1 mg Oral Daily  . levothyroxine  25 mcg Oral Q0600  . pantoprazole  40 mg Oral Daily  . polyethylene glycol  17 g Oral BID  . Ensure Max Protein  11 oz Oral BID    Social History   Tobacco Use  . Smoking status: Never Smoker  . Smokeless tobacco: Never Used  Vaping Use  . Vaping Use: Never used  Substance Use Topics  . Alcohol use: No  . Drug use: No    Family History  Problem Relation Age of Onset  . Breast cancer Mother 65       d.52 from metastatic disease  . Throat cancer Father  d.65 history of smoking  . Rheum arthritis Sister        3 sisters pos. for osteo and RA  . Diabetes Maternal Aunt   . Cancer Maternal Grandfather        mouth cancer-chewed tobacco  . Breast cancer Cousin 2  . Breast cancer Daughter 59  . Stomach cancer Sister 20    Review of Systems -  Constitutional: positive for fever, chills, diaphoresis, activity change, appetite change, fatigue and unexpected weight change.  HENT: Negative for congestion, sore throat, rhinorrhea, sneezing, trouble swallowing and sinus pressure.  Eyes: Negative for photophobia and visual disturbance.  Respiratory: Negative for cough, chest tightness, shortness of breath, wheezing and stridor.  Cardiovascular: Negative for chest pain, palpitations  and leg swelling.  Gastrointestinal: Negative for nausea, vomiting, abdominal pain, diarrhea, constipation, blood in stool, abdominal distention and anal bleeding.  Genitourinary: Negative for dysuria, hematuria, flank pain and difficulty urinating.  Musculoskeletal: Negative for myalgias, back pain, joint swelling, arthralgias and gait problem.  Skin: Negative for color change, pallor, rash and wound.  Neurological: Negative for dizziness, tremors, weakness and light-headedness.  Hematological: Negative for adenopathy. Does not bruise/bleed easily.  Psychiatric/Behavioral: Negative for behavioral problems, confusion, sleep disturbance, dysphoric mood, decreased concentration and agitation.     OBJECTIVE: Temp:  [98.2 F (36.8 C)-102 F (38.9 C)] 98.7 F (37.1 C) (10/26 1549) Pulse Rate:  [75-119] 86 (10/26 1549) Resp:  [16-20] 20 (10/26 1549) BP: (112-168)/(45-99) 168/57 (10/26 1549) SpO2:  [88 %-95 %] 94 % (10/26 1549) Weight:  [101.2 kg] 101.2 kg (10/26 0500) Physical Exam  Constitutional:  oriented to person, place, and time. appears well-developed and well-nourished. No distress.  HENT: [place]/AT, PERRLA, no scleral icterus Mouth/Throat: Oropharynx is clear and moist. No oropharyngeal exudate.  Cardiovascular: Normal rate, regular rhythm and normal heart sounds. Exam reveals no gallop and no friction rub.  No murmur heard.  Pulmonary/Chest: Effort normal and breath sounds normal. No respiratory distress.  has no wheezes.  Neck = supple, no nuchal rigidity Abdominal: Soft. Bowel sounds are normal.  exhibits no distension. There is no tenderness.  Lymphadenopathy: no cervical adenopathy. No axillary adenopathy Neurological: alert and oriented to person, place, and time. Head tremor Skin: Skin is warm and dry. No rash noted. No erythema.  Psychiatric: a normal mood and affect.  behavior is normal.    LABS: Results for orders placed or performed during the hospital encounter of  03/09/20 (from the past 48 hour(s))  CBC with Differential/Platelet     Status: Abnormal   Collection Time: 03/13/20  6:15 AM  Result Value Ref Range   WBC 4.1 4.0 - 10.5 K/uL   RBC 2.96 (L) 3.87 - 5.11 MIL/uL   Hemoglobin 10.5 (L) 12.0 - 15.0 g/dL   HCT 32.0 (L) 36 - 46 %   MCV 108.1 (H) 80.0 - 100.0 fL   MCH 35.5 (H) 26.0 - 34.0 pg   MCHC 32.8 30.0 - 36.0 g/dL   RDW 15.1 11.5 - 15.5 %   Platelets 97 (L) 150 - 400 K/uL    Comment: Immature Platelet Fraction may be clinically indicated, consider ordering this additional test [id number] CONSISTENT WITH PREVIOUS RESULT    nRBC 0.0 0.0 - 0.2 %   Neutrophils Relative % 73 %   Neutro Abs 3.0 1.7 - 7.7 K/uL   Lymphocytes Relative 17 %   Lymphs Abs 0.7 0.7 - 4.0 K/uL   Monocytes Relative 8 %   Monocytes Absolute 0.3 0.1 -  1.0 K/uL   Eosinophils Relative 1 %   Eosinophils Absolute 0.0 0.0 - 0.5 K/uL   Basophils Relative 1 %   Basophils Absolute 0.0 0.0 - 0.1 K/uL   Immature Granulocytes 0 %   Abs Immature Granulocytes 0.01 0.00 - 0.07 K/uL    Comment: Performed at [facility], [place] [address] [place], [place] [address]  Comprehensive metabolic panel     Status: Abnormal   Collection Time: 03/13/20  6:15 AM  Result Value Ref Range   Sodium 135 135 - 145 mmol/L   Potassium 4.5 3.5 - 5.1 mmol/L   Chloride 102 98 - 111 mmol/L   CO2 25 22 - 32 mmol/L   Glucose, Bld 128 (H) 70 - 99 mg/dL    Comment: Glucose reference range applies only to samples taken after fasting for at least 8 hours.   BUN 10 8 - 23 mg/dL   Creatinine, Ser 0.56 0.44 - 1.00 mg/dL   Calcium 7.7 (L) 8.9 - 10.3 mg/dL   Total Protein 5.7 (L) 6.5 - 8.1 g/dL   Albumin 2.0 (L) 3.5 - 5.0 g/dL   AST 81 (H) 15 - 41 U/L   ALT 38 0 - 44 U/L   Alkaline Phosphatase 114 38 - 126 U/L   Total Bilirubin 1.0 0.3 - 1.2 mg/dL   GFR, Estimated >60 >60 mL/min    Comment: (NOTE) Calculated using the CKD-EPI Creatinine Equation (2021)    Anion gap 8 5 - 15     Comment: Performed at [facility], [place] [address] [place], [place] [address]  Magnesium     Status: None   Collection Time: 03/13/20  6:15 AM  Result Value Ref Range   Magnesium 1.8 1.7 - 2.4 mg/dL    Comment: Performed at [facility], [place] [address] [place], [place] [address]  Phosphorus     Status: None   Collection Time: 03/13/20  6:15 AM  Result Value Ref Range   Phosphorus 3.8 2.5 - 4.6 mg/dL    Comment: Performed at [facility], [place] [address] [place], [place] [address]  Culture, blood (routine x 2)     Status: None (Preliminary result)   Collection Time: 03/13/20  2:53 PM   Specimen: BLOOD LEFT FOREARM  Result Value Ref Range   Specimen Description      BLOOD LEFT FOREARM Performed at [place] [address], [place], [place] [address]    Special Requests      BOTTLES DRAWN AEROBIC AND ANAEROBIC Blood Culture adequate volume Performed at [place] [address], [place], [place] [address]    Culture  Setup Time      GRAM POSITIVE COCCI IN CHAINS IN BOTH AEROBIC AND ANAEROBIC BOTTLES CRITICAL RESULT CALLED TO, READ BACK BY AND VERIFIED WITH: E. WILLIAMSON,PHARMD 0600 03/14/2020 T. TYSOR Performed at [facility], [place] [address], [place], [place] [address]    Culture GRAM POSITIVE COCCI    Report Status PENDING   Blood Culture ID Panel (Reflexed)     Status: Abnormal   Collection Time: 03/13/20  2:53 PM  Result Value Ref Range   Enterococcus faecalis NOT DETECTED NOT DETECTED   Enterococcus Faecium NOT DETECTED NOT DETECTED   Listeria monocytogenes NOT DETECTED NOT DETECTED   Staphylococcus species NOT DETECTED NOT DETECTED   Staphylococcus aureus (BCID) NOT DETECTED NOT DETECTED   Staphylococcus epidermidis NOT DETECTED NOT DETECTED   Staphylococcus  lugdunensis NOT DETECTED NOT DETECTED   Streptococcus species DETECTED (A) NOT DETECTED    Comment: Not  Enterococcus species, Streptococcus agalactiae, Streptococcus pyogenes, or Streptococcus pneumoniae. CRITICAL RESULT CALLED TO, READ BACK BY AND VERIFIED WITH: E. WILLIAMSON,PHARMD 0600 03/14/2020 T. TYSOR    Streptococcus agalactiae NOT DETECTED NOT DETECTED   Streptococcus pneumoniae NOT DETECTED NOT DETECTED   Streptococcus pyogenes NOT DETECTED NOT DETECTED   A.calcoaceticus-baumannii NOT DETECTED NOT DETECTED   Bacteroides fragilis NOT DETECTED NOT DETECTED   Enterobacterales NOT DETECTED NOT DETECTED   Enterobacter cloacae complex NOT DETECTED NOT DETECTED   Escherichia coli NOT DETECTED NOT DETECTED   Klebsiella aerogenes NOT DETECTED NOT DETECTED   Klebsiella oxytoca NOT DETECTED NOT DETECTED   Klebsiella pneumoniae NOT DETECTED NOT DETECTED   Proteus species NOT DETECTED NOT DETECTED   Salmonella species NOT DETECTED NOT DETECTED   Serratia marcescens NOT DETECTED NOT DETECTED   Haemophilus influenzae NOT DETECTED NOT DETECTED   Neisseria meningitidis NOT DETECTED NOT DETECTED   Pseudomonas aeruginosa NOT DETECTED NOT DETECTED   Stenotrophomonas maltophilia NOT DETECTED NOT DETECTED   Candida albicans NOT DETECTED NOT DETECTED   Candida auris NOT DETECTED NOT DETECTED   Candida glabrata NOT DETECTED NOT DETECTED   Candida krusei NOT DETECTED NOT DETECTED   Candida parapsilosis NOT DETECTED NOT DETECTED   Candida tropicalis NOT DETECTED NOT DETECTED   Cryptococcus neoformans/gattii NOT DETECTED NOT DETECTED    Comment: Performed at [facility], [address], [place], [place] [address]  Urinalysis, Routine w reflex microscopic     Status: Abnormal   Collection Time: 03/13/20  8:00 PM  Result Value Ref Range   Color, Urine YELLOW YELLOW   APPearance HAZY (A) CLEAR   Specific Gravity, Urine 1.008 1.005 - 1.030   pH 7.0 5.0 - 8.0   Glucose, UA NEGATIVE NEGATIVE mg/dL   Hgb urine dipstick MODERATE (A) NEGATIVE   Bilirubin Urine NEGATIVE NEGATIVE   Ketones, ur  NEGATIVE NEGATIVE mg/dL   Protein, ur NEGATIVE NEGATIVE mg/dL   Nitrite NEGATIVE NEGATIVE   Leukocytes,Ua TRACE (A) NEGATIVE   RBC / HPF 0-5 0 - 5 RBC/hpf   WBC, UA 0-5 0 - 5 WBC/hpf   Bacteria, UA FEW (A) NONE SEEN    Comment: Performed at [facility], [place] [address] [place], [place] [address]  Brain natriuretic peptide     Status: Abnormal   Collection Time: 03/14/20  3:33 AM  Result Value Ref Range   B Natriuretic Peptide 278.4 (H) 0.0 - 100.0 pg/mL    Comment: Performed at [facility], [place] [address] [place], [place] [address]  CBC with Differential/Platelet     Status: Abnormal   Collection Time: 03/14/20  3:33 AM  Result Value Ref Range   WBC 4.1 4.0 - 10.5 K/uL   RBC 2.91 (L) 3.87 - 5.11 MIL/uL   Hemoglobin 10.2 (L) 12.0 - 15.0 g/dL   HCT 31.2 (L) 36 - 46 %   MCV 107.2 (H) 80.0 - 100.0 fL   MCH 35.1 (H) 26.0 - 34.0 pg   MCHC 32.7 30.0 - 36.0 g/dL   RDW 15.2 11.5 - 15.5 %   Platelets 100 (L) 150 - 400 K/uL    Comment: REPEATED TO VERIFY Immature Platelet Fraction may be clinically indicated, consider ordering this additional test [id number] CONSISTENT WITH PREVIOUS RESULT    nRBC 0.0 0.0 - 0.2 %   Neutrophils Relative % 67 %   Neutro  Abs 2.8 1.7 - 7.7 K/uL   Lymphocytes Relative 21 %   Lymphs Abs 0.9 0.7 - 4.0 K/uL   Monocytes Relative 10 %   Monocytes Absolute 0.4 0.1 - 1.0 K/uL   Eosinophils Relative 1 %   Eosinophils Absolute 0.0 0.0 - 0.5 K/uL   Basophils Relative 0 %   Basophils Absolute 0.0 0.0 - 0.1 K/uL   Immature Granulocytes 1 %   Abs Immature Granulocytes 0.03 0.00 - 0.07 K/uL    Comment: Performed at [name], [place] [address], [place], [place] [address]  Comprehensive metabolic panel     Status: Abnormal   Collection Time: 03/14/20  3:33 AM  Result Value Ref Range   Sodium 136 135 - 145 mmol/L   Potassium 3.7 3.5 - 5.1 mmol/L    Comment: DELTA CHECK NOTED   Chloride 100 98 - 111 mmol/L     CO2 27 22 - 32 mmol/L   Glucose, Bld 125 (H) 70 - 99 mg/dL    Comment: Glucose reference range applies only to samples taken after fasting for at least 8 hours.   BUN 10 8 - 23 mg/dL   Creatinine, Ser 0.52 0.44 - 1.00 mg/dL   Calcium 7.8 (L) 8.9 - 10.3 mg/dL   Total Protein 5.9 (L) 6.5 - 8.1 g/dL   Albumin 2.2 (L) 3.5 - 5.0 g/dL   AST 77 (H) 15 - 41 U/L   ALT 40 0 - 44 U/L   Alkaline Phosphatase 127 (H) 38 - 126 U/L   Total Bilirubin 0.8 0.3 - 1.2 mg/dL   GFR, Estimated >60 >60 mL/min    Comment: (NOTE) Calculated using the CKD-EPI Creatinine Equation (2021)    Anion gap 9 5 - 15    Comment: Performed at [facility], [place] [address], [place], [place] [address]  Magnesium     Status: None   Collection Time: 03/14/20  3:33 AM  Result Value Ref Range   Magnesium 1.8 1.7 - 2.4 mg/dL    Comment: Performed at [facility], [place] [address], [place], [place] [address]  Phosphorus     Status: None   Collection Time: 03/14/20  3:33 AM  Result Value Ref Range   Phosphorus 3.7 2.5 - 4.6 mg/dL    Comment: Performed at [facility], [place] [address] [place], [place] [address]  Culture, blood (routine x 2)     Status: None (Preliminary result)   Collection Time: 03/14/20  4:28 AM   Specimen: BLOOD LEFT HAND  Result Value Ref Range   Specimen Description BLOOD LEFT HAND    Special Requests      BOTTLES DRAWN AEROBIC ONLY Blood Culture results may not be optimal due to an inadequate volume of blood received in culture bottles Performed at [place] [address] [place], [place] [address]    Culture PENDING    Report Status PENDING     MICRO:  IMAGING: DG Abd 1 View  Result Date: 03/13/2020 CLINICAL DATA:  Abdominal pain and distension EXAM: ABDOMEN - 1 VIEW COMPARISON:  March 09, 2020 FINDINGS: There is moderate stool throughout the colon. No bowel dilatation or air-fluid level to suggest bowel obstruction. No free  air. Scattered foci of vascular calcification noted. IMPRESSION: Moderate stool in colon.  No evident bowel obstruction or free air. Electronically Signed   By: Lowella Grip III M.D.   On: 03/13/2020 14:37   DG CHEST PORT 1  VIEW  Result Date: 03/14/2020 CLINICAL DATA:  COVID pneumonia. EXAM: PORTABLE CHEST 1 VIEW COMPARISON:  One-view chest x-ray 03/13/2020 FINDINGS: Heart size exaggerated by low lung volumes. Interstitial and airspace opacities are again noted, greatest in the right upper lobe and left lower lobe. Overall aeration is improving. Degenerative changes are noted at the shoulders. IMPRESSION: Improving aeration with persistent interstitial and airspace disease in the right upper lobe and left lower lobe. Electronically Signed   By: [place] Morelle M.D.   On: 03/14/2020 06:06   DG CHEST PORT 1 VIEW  Result Date: 03/13/2020 CLINICAL DATA:  Shortness of breath, COVID positive EXAM: PORTABLE CHEST 1 VIEW COMPARISON:  03/10/2020 FINDINGS: Portion of the left lung base is excluded. Slightly increased interstitial prominence. No significant pleural effusion. No pneumothorax. Stable cardiomediastinal contours. IMPRESSION: Slightly increased interstitial prominence, which may reflect worsening edema or pneumonia. Electronically Signed   By: Macy Mis M.D.   On: 03/13/2020 08:51    Assessment/Plan:  74yoF with covid pneumonia and CVA concern for either hypercoag state associated with COVID vs. Malignancy and less likely from Endocarditis since has bacteremia noted on HD #5, recent blood cx. Blood cx since new onset fever,-not seen on admission. She had blood cx only drawn on hospital day 5- so it could potentially also be secondary bacteremia in immunocompromised host. She is breakthrough case, had j and j previous to admit.  - recommend to continue with ceftriaxone -await identification of streptococcal species - will follow up repeat blood cx from 10/26, and if they are positive,  then recommend to get TEE to evaluate for endocarditis. - source of bacteremia is unclear. Doesn't have any present lines. Will continue to monitor for any new sources of infection  Maxson Oddo B. [facility] [phone]

## 2020-03-14 NOTE — Progress Notes (Signed)
Pt. declining BiPAP/CPAP this admission, aware to notify.

## 2020-03-14 NOTE — Progress Notes (Addendum)
PROGRESS NOTE    Jasmine RINCON  Hood:[id number] DOB: 10-25-1945 DOA: 03/09/2020 PCP: Jasmine Hood, Jasmine Halsted, MD   Chief Complaint  Patient presents with  . Emesis    Brief Narrative:  Jasmine Hood is Jasmine Hood 74 y.o. female with medical history significant of metastatic breast cancer, with metastasis to the thoracic spine. Patient was brought to the hospital due to generalized weakness. She lives with her daughter, over the last approximately 5 to 10 days she has been developing progressive generalized weakness. About 2 days ago she got out of the bathroom after having Jasmine Hood shower, on her way to her bedroom she collapsed on the floor. No head trauma or loss of consciousness. She required assistance to get back to her bedroom but she was too weak to stand up, per her report she stayed on the floor for about 48 hours. Today her daughter called EMS and she was transported to the hospital.  She complains of poor appetite, generalized malaise but not fevers or chills. Positive nausea, vomiting and diarrhea.  Denies any sick contacts.  She described her weakness as generalized, persistent, no improving factors, worse with exertion.  She was admitted for generalized weakness and had head imaging concerning for metastasis.  She had MRI brain with acute ischemic L cerebellar infarct and concern for tiny subacute nonhemorrhagic infarcts in the R centrum ovale.  There was concern for possible hemorrhagic metastasis and she's currently pending repeat MRI.  She's been seen by neurology.  She developed Jasmine Hood fever on 10/24 and 10/25.  Blood culture with gram positive cocci in chains, BCID with strep species.  ID c/s pending.  Assessment & Plan:   Principal Problem:   Weakness Active Problems:   Hypothyroidism   Essential hypertension   OSA (obstructive sleep apnea)   Malignant neoplasm of overlapping sites of right breast in female, estrogen receptor positive (HCC)   Bone metastases (HCC)   Stroke  (HCC)   COVID-19  Strep Bacteremia  SIRS  Fever  Tachycardia: Gram positive cocci in chains in culture from 10/25 Repeat cx pending Echo from 10/22 with mild AVR, mild MVR, no reported vegetation Ceftriaxone ID c/s, appreciate recommendations  Generalized Weakness  Likely multifactorial with covid 19 virus infection, L cerebellar infarct and possible hemorrhagic metastasis Continue IVF PT/OT -> recommending home health, supervision for mobility   COVID 19 Virus Infection Vaccinated with J&J maybe about 1 month ago S/p regeneron 10/22 Oxygen sats today in the mid to high 90's - sats maintained >89% 10/23 working with therapy CT with scattered areas of interstitial prominence partially present on prior scan, some of these areas may represent active covid pneumonia, others likely related to chronic dz and prior radiation therapy Continue to monitor closely Given elevated d dimer and tachy, will follow CT PE protocol Prone as able, OOB, IS, flutter.  Therapy.  COVID-19 Labs  No results for input(s): DDIMER, FERRITIN, LDH, CRP in the last 72 hours.  Lab Results  Component Value Date   SARSCOV2NAA POSITIVE (Jasmine Hood) 03/09/2020   [place] NEGATIVE 11/10/2019   [place] NEGATIVE 10/26/2018   SIRS  Fever  tachycardia: tachycardia resolved, fever 10/24-25 PM.   Likely 2/2 strep bacteremia above Follow UA and culture repeat CXR (with slightly increased interstitial prominence) CXR 10/26 with improving aeration with persistent interstitial and airspace disease in the RUL and LLL  L Cerebellar Infarct Neuro c/s, appreciate recs - suspected this was incidental NO asa/lovenox give concern for hemorrhagic metastatsis Per neurology, avoid statin  for now CTA head and neck with unchanged punctate focus of blood in posterior r temporal lobe, no emergent large vessel occlusion or high grade stenosis, mild bilateral carotid birfucation atherosclerosis without hemodynamically significant  stenosis, multiple ill defined sclerotic calvarial and vertebral lesions compatible with known metastatic disease Echo with EF 70-75% (see report) LE [place] negative for DVT Neuro c/s, appreciate recs - outpatient neuro follow up PT/OT/SLP  Possible Hemorrhagic Metastasis Hold anticoagulation, neuro following MRI brain with contrast pending per neurology Oncology recommending no immediate intervention, follow up MRI in 2-3 months Neurosurgery recommended follow up imaging Consider palliative care c/s  Metastatic Breast Cancer Follows with Jasmine Hood outpatient Continue anastrozole C/s oncology as above  Dehydration  Hyponatremia: follow with IVF  Pyuria: follow urine cultur -> multiple species  Hypertension: follow  OSA: continue cpap  Thrombocytopenia: follow, downtrending, continue to monitor   DVT prophylaxis: SCD Code Status:DNR Family Communication: none at bedside - Jasmine Hood 10/25 - called on 10/26, no answer Disposition:   Status is: Observation  The patient will require care spanning > 2 midnights and should be moved to inpatient because: Inpatient level of care appropriate due to severity of illness  Dispo: The patient is from: Home              Anticipated d/c is to: pending              Anticipated d/c date is: > 3 days              Patient currently is not medically stable to d/c.       Consultants:   Neurology  oncology  Procedures:  Summary:  BILATERAL:  - No evidence of deep vein thrombosis seen in the lower extremities,  bilaterally.  - No evidence of superficial venous thrombosis in the lower extremities,  bilaterally.   Summary:  Right Carotid: Velocities in the right ICA are consistent with Jasmine Hood 1-39%  stenosis.   Left Carotid: Velocities in the left ICA are consistent with Jasmine Hood 1-39%  stenosis.   Vertebrals: Left vertebral artery demonstrates antegrade flow. Right  vertebral       artery was not visualized.   IMPRESSIONS     1. Left ventricular ejection fraction, by estimation, is 70 to 75%. The  left ventricle has hyperdynamic function. The left ventricle has no  regional wall motion abnormalities. Left ventricular diastolic parameters  were normal.  2. Right ventricular systolic function is normal. The right ventricular  size is normal.  3. The mitral valve is normal in structure. Mild mitral valve  regurgitation. No evidence of mitral stenosis.  4. The aortic valve is tricuspid. Aortic valve regurgitation is mild.  Mild aortic valve sclerosis is present, with no evidence of aortic valve  stenosis.  5. The inferior vena cava is normal in size with greater than 50%  respiratory variability, suggesting right atrial pressure of 3 mmHg.    Antimicrobials:  Anti-infectives (From admission, onward)   Start     Dose/Rate Route Frequency Ordered Stop   03/14/20 0730  cefTRIAXone (ROCEPHIN) 2 g in sodium chloride 0.9 % 100 mL IVPB        2 g 200 mL/hr over 30 Minutes Intravenous Every 24 hours 03/14/20 0605           Subjective: No new complaints  Objective: Vitals:   03/14/20 0500 03/14/20 0748 03/14/20 1141 03/14/20 1549  BP:  (!) 149/68 (!) 153/53 (!) 168/57  Pulse:  80 89 86  Resp:  16 18 20   Temp:  98.4 F (36.9 C) 98.8 F (37.1 C) 98.7 F (37.1 C)  TempSrc:  Oral Oral Oral  SpO2:  (!) 88% 95% 94%  Weight: 101.2 kg     Height:        Intake/Output Summary (Last 24 hours) at 03/14/2020 1643 Last data filed at 03/14/2020 1345 Gross per 24 hour  Intake 360 ml  Output 2200 ml  Net -1840 ml   Filed Weights   03/12/20 0500 03/13/20 0500 03/14/20 0500  Weight: 103.7 kg 107.8 kg 101.2 kg    Examination:  General: No acute distress. Cardiovascular: Heart sounds show Kashawn Manzano regular rate, and rhythm.  Lungs: Clear to auscultation bilaterally  Abdomen: Soft, nontender, nondistended  Neurological: Alert and oriented 3. Moves all extremities 4 . Cranial nerves II through XII  grossly intact. Skin: Warm and dry. No rashes or lesions. Extremities: No clubbing or cyanosis. No edema.     Data Reviewed: I have personally reviewed following labs and imaging studies  CBC: Recent Labs  Lab 03/09/20 1245 03/10/20 0604 03/11/20 0408 03/13/20 0615 03/14/20 0333  WBC 9.5 6.8 4.0 4.1 4.1  NEUTROABS 7.7  --  2.9 3.0 2.8  HGB 11.9* 11.0* 10.4* 10.5* 10.2*  HCT 35.1* 33.4* 31.6* 32.0* 31.2*  MCV 104.5* 105.7* 106.0* 108.1* 107.2*  PLT 124* 124* 113* 97* 100*    Basic Metabolic Panel: Recent Labs  Lab 03/09/20 1245 03/10/20 0604 03/11/20 0408 03/13/20 0615 03/14/20 0333  NA 133*  --  136 135 136  K 4.1  --  4.1 4.5 3.7  CL 101  --  103 102 100  CO2 23  --  24 25 27   GLUCOSE 150*  --  138* 128* 125*  BUN 10  --  10 10 10   CREATININE 0.60 0.63 0.51 0.56 0.52  CALCIUM 7.5*  --  7.5* 7.7* 7.8*  MG  --   --  2.0 1.8 1.8  PHOS  --   --  2.5 3.8 3.7    GFR: Estimated Creatinine Clearance: 62.7 mL/min (by C-G formula based on SCr of 0.52 mg/dL).  Liver Function Tests: Recent Labs  Lab 03/09/20 1245 03/11/20 0408 03/13/20 0615 03/14/20 0333  AST 96* 84* 81* 77*  ALT 33 31 38 40  ALKPHOS 105 106 114 127*  BILITOT 1.2 0.7 1.0 0.8  PROT 6.8 5.7* 5.7* 5.9*  ALBUMIN 2.5* 2.1* 2.0* 2.2*    CBG: Recent Labs  Lab 03/09/20 1206  GLUCAP 148*     Recent Results (from the past 240 hour(s))  Respiratory Panel by RT PCR (Flu Yaslyn Cumby&B, Covid) -     Status: Abnormal   Collection Time: 03/09/20  3:41 PM   Specimen: Nasopharyngeal  Result Value Ref Range Status   SARS Coronavirus 2 by RT PCR POSITIVE (Arius Harnois) NEGATIVE Final    Comment: RESULT CALLED TO, READ BACK BY AND VERIFIED WITH: R.GROVES AT [place] ON 03/09/20 BY N.THOMPSON (NOTE) SARS-CoV-2 target nucleic acids are DETECTED.  SARS-CoV-2 RNA is generally detectable in upper respiratory specimens  during the acute phase of infection. Positive results are indicative of the presence of the identified virus,  but do not rule out bacterial infection or co-infection with other pathogens not detected by the test. Clinical correlation with patient history and other diagnostic information is necessary to determine patient infection status. The expected result is Negative.  Fact Sheet for Patients:  PinkCheek.be  Fact Sheet for Healthcare Providers: GravelBags.it  This test  is not yet approved or cleared by the [place] and  has been authorized for detection and/or diagnosis of SARS-CoV-2 by FDA under an Emergency Use Authorization (EUA).  This EUA will remain in effect (meaning this test  can be used) for the duration of  the COVID-19 declaration under Section 564(b)(1) of the Act, 21 U.S.C. section 360bbb-3(b)(1), unless the authorization is terminated or revoked sooner.      Influenza Alma Mohiuddin by PCR NEGATIVE NEGATIVE Final   Influenza B by PCR NEGATIVE NEGATIVE Final    Comment: (NOTE) The Xpert Xpress SARS-CoV-2/FLU/RSV assay is intended as an aid in  the diagnosis of influenza from Nasopharyngeal swab specimens and  should not be used as Dewane Timson sole basis for treatment. Nasal washings and  aspirates are unacceptable for Xpert Xpress SARS-CoV-2/FLU/RSV  testing.  Fact Sheet for Patients: PinkCheek.be  Fact Sheet for Healthcare Providers: GravelBags.it  This test is not yet approved or cleared by the [place] FDA and  has been authorized for detection and/or diagnosis of SARS-CoV-2 by  FDA under an Emergency Use Authorization (EUA). This EUA will remain  in effect (meaning this test can be used) for the duration of the  Covid-19 declaration under Section 564(b)(1) of the Act, 21  U.S.C. section 360bbb-3(b)(1), unless the authorization is  terminated or revoked. Performed at [facility], [place] [address], [place], [place] [address]   Culture,  Urine     Status: Abnormal   Collection Time: 03/09/20  3:41 PM   Specimen: Urine, Clean Catch  Result Value Ref Range Status   Specimen Description   Final    URINE, CLEAN CATCH Performed at [facility], [place] [address] [place], [place] [address]    Special Requests   Final    NONE Performed at [facility], [place] [address] [place], [place] [address]    Culture MULTIPLE SPECIES PRESENT, SUGGEST RECOLLECTION (Pruitt Taboada)  Final   Report Status 03/11/2020 FINAL  Final  Culture, blood (routine x 2)     Status: None (Preliminary result)   Collection Time: 03/13/20  2:53 PM   Specimen: BLOOD LEFT FOREARM  Result Value Ref Range Status   Specimen Description   Final    BLOOD LEFT FOREARM Performed at [place] [address] [place], [place] [address]    Special Requests   Final    BOTTLES DRAWN AEROBIC AND ANAEROBIC Blood Culture adequate volume Performed at [facility] [address], [place], [place] [address]    Culture  Setup Time   Final    GRAM POSITIVE COCCI IN CHAINS IN BOTH AEROBIC AND ANAEROBIC BOTTLES CRITICAL RESULT CALLED TO, READ BACK BY AND VERIFIED WITH: E. WILLIAMSON,PHARMD 0600 03/14/2020 T. TYSOR Performed at [facility], [place] [address], [place],  [address]    Culture GRAM POSITIVE COCCI  Final   Report Status PENDING  Incomplete  Blood Culture ID Panel (Reflexed)     Status: Abnormal   Collection Time: 03/13/20  2:53 PM  Result Value Ref Range Status   Enterococcus faecalis NOT DETECTED NOT DETECTED Final   Enterococcus Faecium NOT DETECTED NOT DETECTED Final   Listeria monocytogenes NOT DETECTED NOT DETECTED Final   Staphylococcus species NOT DETECTED NOT DETECTED Final   Staphylococcus aureus (BCID) NOT DETECTED NOT DETECTED Final   Staphylococcus epidermidis NOT DETECTED NOT DETECTED Final   Staphylococcus lugdunensis NOT DETECTED NOT DETECTED Final   Streptococcus  species  DETECTED (Yvette Loveless) NOT DETECTED Final    Comment: Not Enterococcus species, Streptococcus agalactiae, Streptococcus pyogenes, or Streptococcus pneumoniae. CRITICAL RESULT CALLED TO, READ BACK BY AND VERIFIED WITH: E. WILLIAMSON,PHARMD 0600 03/14/2020 T. TYSOR    Streptococcus agalactiae NOT DETECTED NOT DETECTED Final   Streptococcus pneumoniae NOT DETECTED NOT DETECTED Final   Streptococcus pyogenes NOT DETECTED NOT DETECTED Final   Aneliz Carbary.calcoaceticus-baumannii NOT DETECTED NOT DETECTED Final   Bacteroides fragilis NOT DETECTED NOT DETECTED Final   Enterobacterales NOT DETECTED NOT DETECTED Final   Enterobacter cloacae complex NOT DETECTED NOT DETECTED Final   Escherichia coli NOT DETECTED NOT DETECTED Final   Klebsiella aerogenes NOT DETECTED NOT DETECTED Final   Klebsiella oxytoca NOT DETECTED NOT DETECTED Final   Klebsiella pneumoniae NOT DETECTED NOT DETECTED Final   Proteus species NOT DETECTED NOT DETECTED Final   Salmonella species NOT DETECTED NOT DETECTED Final   Serratia marcescens NOT DETECTED NOT DETECTED Final   Haemophilus influenzae NOT DETECTED NOT DETECTED Final   Neisseria meningitidis NOT DETECTED NOT DETECTED Final   Pseudomonas aeruginosa NOT DETECTED NOT DETECTED Final   Stenotrophomonas maltophilia NOT DETECTED NOT DETECTED Final   Candida albicans NOT DETECTED NOT DETECTED Final   Candida auris NOT DETECTED NOT DETECTED Final   Candida glabrata NOT DETECTED NOT DETECTED Final   Candida krusei NOT DETECTED NOT DETECTED Final   Candida parapsilosis NOT DETECTED NOT DETECTED Final   Candida tropicalis NOT DETECTED NOT DETECTED Final   Cryptococcus neoformans/gattii NOT DETECTED NOT DETECTED Final    Comment: Performed at [facility], [name] [place], [place] [address]  Culture, blood (routine x 2)     Status: None (Preliminary result)   Collection Time: 03/14/20  4:28 AM   Specimen: BLOOD LEFT HAND  Result Value Ref Range Status   Specimen  Description BLOOD LEFT HAND  Final   Special Requests   Final    BOTTLES DRAWN AEROBIC ONLY Blood Culture results may not be optimal due to an inadequate volume of blood received in culture bottles Performed at [facility] [address] [place], [place] [address]    Culture PENDING  Incomplete   Report Status PENDING  Incomplete         Radiology Studies: DG Abd 1 View  Result Date: 03/13/2020 CLINICAL DATA:  Abdominal pain and distension EXAM: ABDOMEN - 1 VIEW COMPARISON:  March 09, 2020 FINDINGS: There is moderate stool throughout the colon. No bowel dilatation or air-fluid level to suggest bowel obstruction. No free air. Scattered foci of vascular calcification noted. IMPRESSION: Moderate stool in colon.  No evident bowel obstruction or free air. Electronically Signed   By: Lowella Grip III M.D.   On: 03/13/2020 14:37   DG CHEST PORT 1 VIEW  Result Date: 03/14/2020 CLINICAL DATA:  COVID pneumonia. EXAM: PORTABLE CHEST 1 VIEW COMPARISON:  One-view chest x-ray 03/13/2020 FINDINGS: Heart size exaggerated by low lung volumes. Interstitial and airspace opacities are again noted, greatest in the right upper lobe and left lower lobe. Overall aeration is improving. Degenerative changes are noted at the shoulders. IMPRESSION: Improving aeration with persistent interstitial and airspace disease in the right upper lobe and left lower lobe. Electronically Signed   By: San Morelle M.D.   On: 03/14/2020 06:06   DG CHEST PORT 1 VIEW  Result Date: 03/13/2020 CLINICAL DATA:  Shortness of breath, COVID positive EXAM: PORTABLE CHEST 1 VIEW COMPARISON:  03/10/2020 FINDINGS: Portion of the left lung base is  excluded. Slightly increased interstitial prominence. No significant pleural effusion. No pneumothorax. Stable cardiomediastinal contours. IMPRESSION: Slightly increased interstitial prominence, which may reflect worsening edema or pneumonia. Electronically Signed   By: Macy Mis M.D.   On: 03/13/2020 08:51        Scheduled Meds: .  stroke: mapping our early stages of recovery book   Does not apply Once  . anastrozole  1 mg Oral Daily  . levothyroxine  25 mcg Oral Q0600  . pantoprazole  40 mg Oral Daily  . polyethylene glycol  17 g Oral BID  . Ensure Max Protein  11 oz Oral BID   Continuous Infusions: . cefTRIAXone (ROCEPHIN)  IV 2 g (03/14/20 1022)     LOS: 4 days    Time spent: over 30 min    Fayrene Helper, MD [facility]   To contact the attending provider between 7A-7P or the covering provider during after hours 7P-7A, please log into the web site [email or web address] and access using universal [place] password for that web site. If you do not have the password, please call the hospital operator.  03/14/2020, 4:43 PM

## 2020-03-14 NOTE — Progress Notes (Signed)
PHARMACY - PHYSICIAN COMMUNICATION CRITICAL VALUE ALERT - BLOOD CULTURE IDENTIFICATION (BCID)  Jasmine Hood is an 74 y.o. female with metastatic breast cancer, COVID+ 10/21 s/p maB who presented to [facility] on 03/09/2020 with a chief complaint of weakness, emesis.  Assessment:   (include suspected source if known)  Name of physician (or Provider) Contacted: Lang Snow, FNP  Current antibiotics: none  Changes to prescribed antibiotics recommended:  Recommendations accepted by provider - start ceftriaxone 2g IV q24h  Results for orders placed or performed during the hospital encounter of 03/09/20  Blood Culture ID Panel (Reflexed) (Collected: 03/13/2020  2:53 PM)  Result Value Ref Range   Enterococcus faecalis NOT DETECTED NOT DETECTED   Enterococcus Faecium NOT DETECTED NOT DETECTED   Listeria monocytogenes NOT DETECTED NOT DETECTED   Staphylococcus species NOT DETECTED NOT DETECTED   Staphylococcus aureus (BCID) NOT DETECTED NOT DETECTED   Staphylococcus epidermidis NOT DETECTED NOT DETECTED   Staphylococcus lugdunensis NOT DETECTED NOT DETECTED   Streptococcus species DETECTED (A) NOT DETECTED   Streptococcus agalactiae NOT DETECTED NOT DETECTED   Streptococcus pneumoniae NOT DETECTED NOT DETECTED   Streptococcus pyogenes NOT DETECTED NOT DETECTED   A.calcoaceticus-baumannii NOT DETECTED NOT DETECTED   Bacteroides fragilis NOT DETECTED NOT DETECTED   Enterobacterales NOT DETECTED NOT DETECTED   Enterobacter cloacae complex NOT DETECTED NOT DETECTED   Escherichia coli NOT DETECTED NOT DETECTED   Klebsiella aerogenes NOT DETECTED NOT DETECTED   Klebsiella oxytoca NOT DETECTED NOT DETECTED   Klebsiella pneumoniae NOT DETECTED NOT DETECTED   Proteus species NOT DETECTED NOT DETECTED   Salmonella species NOT DETECTED NOT DETECTED   Serratia marcescens NOT DETECTED NOT DETECTED   Haemophilus influenzae NOT DETECTED NOT DETECTED   Neisseria meningitidis NOT DETECTED NOT  DETECTED   Pseudomonas aeruginosa NOT DETECTED NOT DETECTED   Stenotrophomonas maltophilia NOT DETECTED NOT DETECTED   Candida albicans NOT DETECTED NOT DETECTED   Candida auris NOT DETECTED NOT DETECTED   Candida glabrata NOT DETECTED NOT DETECTED   Candida krusei NOT DETECTED NOT DETECTED   Candida parapsilosis NOT DETECTED NOT DETECTED   Candida tropicalis NOT DETECTED NOT DETECTED   Cryptococcus neoformans/gattii NOT DETECTED NOT DETECTED    Peggyann Juba, PharmD, BCPS Pharmacy: [phone] 03/14/2020  6:06 AM

## 2020-03-15 DIAGNOSIS — U071 COVID-19: Secondary | ICD-10-CM | POA: Diagnosis not present

## 2020-03-15 DIAGNOSIS — C7951 Secondary malignant neoplasm of bone: Secondary | ICD-10-CM | POA: Diagnosis not present

## 2020-03-15 DIAGNOSIS — I10 Essential (primary) hypertension: Secondary | ICD-10-CM | POA: Diagnosis not present

## 2020-03-15 DIAGNOSIS — E038 Other specified hypothyroidism: Secondary | ICD-10-CM | POA: Diagnosis not present

## 2020-03-15 LAB — CBC WITH DIFFERENTIAL/PLATELET
Abs Immature Granulocytes: 0.03 10*3/uL (ref 0.00–0.07)
Basophils Absolute: 0 10*3/uL (ref 0.0–0.1)
Basophils Relative: 1 %
Eosinophils Absolute: 0 10*3/uL (ref 0.0–0.5)
Eosinophils Relative: 1 %
HCT: 34.2 % — ABNORMAL LOW (ref 36.0–46.0)
Hemoglobin: 11.2 g/dL — ABNORMAL LOW (ref 12.0–15.0)
Immature Granulocytes: 1 %
Lymphocytes Relative: 11 %
Lymphs Abs: 0.6 10*3/uL — ABNORMAL LOW (ref 0.7–4.0)
MCH: 34.9 pg — ABNORMAL HIGH (ref 26.0–34.0)
MCHC: 32.7 g/dL (ref 30.0–36.0)
MCV: 106.5 fL — ABNORMAL HIGH (ref 80.0–100.0)
Monocytes Absolute: 0.2 10*3/uL (ref 0.1–1.0)
Monocytes Relative: 5 %
Neutro Abs: 4.1 10*3/uL (ref 1.7–7.7)
Neutrophils Relative %: 81 %
Platelets: 99 10*3/uL — ABNORMAL LOW (ref 150–400)
RBC: 3.21 MIL/uL — ABNORMAL LOW (ref 3.87–5.11)
RDW: 15.4 % (ref 11.5–15.5)
WBC: 5 10*3/uL (ref 4.0–10.5)
nRBC: 0 % (ref 0.0–0.2)

## 2020-03-15 LAB — COMPREHENSIVE METABOLIC PANEL
ALT: 41 U/L (ref 0–44)
AST: 86 U/L — ABNORMAL HIGH (ref 15–41)
Albumin: 2.3 g/dL — ABNORMAL LOW (ref 3.5–5.0)
Alkaline Phosphatase: 139 U/L — ABNORMAL HIGH (ref 38–126)
Anion gap: 9 (ref 5–15)
BUN: 10 mg/dL (ref 8–23)
CO2: 23 mmol/L (ref 22–32)
Calcium: 7.8 mg/dL — ABNORMAL LOW (ref 8.9–10.3)
Chloride: 101 mmol/L (ref 98–111)
Creatinine, Ser: 0.54 mg/dL (ref 0.44–1.00)
GFR, Estimated: >60 mL/min (ref >60)
Glucose, Bld: 153 mg/dL — ABNORMAL HIGH (ref 70–99)
Potassium: 4.1 mmol/L (ref 3.5–5.1)
Sodium: 133 mmol/L — ABNORMAL LOW (ref 135–145)
Total Bilirubin: 0.9 mg/dL (ref 0.3–1.2)
Total Protein: 6.4 g/dL — ABNORMAL LOW (ref 6.5–8.1)

## 2020-03-15 LAB — URINE CULTURE

## 2020-03-15 LAB — MAGNESIUM: Magnesium: 1.8 mg/dL (ref 1.7–2.4)

## 2020-03-15 LAB — PHOSPHORUS: Phosphorus: 3 mg/dL (ref 2.5–4.6)

## 2020-03-15 MED ORDER — SODIUM CHLORIDE 0.9 % IV SOLN
2.0000 g | Freq: Two times a day (BID) | INTRAVENOUS | Status: DC
  Administered 2020-03-15 – 2020-03-16 (×2): 2 g via INTRAVENOUS
  Filled 2020-03-15 (×2): qty 2

## 2020-03-15 MED ORDER — ASPIRIN 81 MG PO CHEW
81.0000 mg | CHEWABLE_TABLET | Freq: Every day | ORAL | Status: DC
  Administered 2020-03-17 – 2020-03-19 (×3): 81 mg via ORAL
  Filled 2020-03-15 (×4): qty 1

## 2020-03-15 NOTE — Progress Notes (Signed)
   03/15/20 0412  Assess: MEWS Score  Temp (!) 102.4 F (39.1 C)  BP (!) 162/75  Pulse Rate (!) 105  Resp 18  Level of Consciousness Alert  SpO2 94 %  O2 Device Room Air  Patient Activity (if Appropriate) In bed  Assess: MEWS Score  MEWS Temp 2  MEWS Systolic 0  MEWS Pulse 1  MEWS RR 0  MEWS LOC 0  MEWS Score 3  MEWS Score Color Yellow  Assess: if the MEWS score is Yellow or Red  Were vital signs taken at a resting state? Yes  Focused Assessment No change from prior assessment  Early Detection of Sepsis Score *See Row Information* Low  MEWS guidelines implemented *See Row Information* Yes  Treat  MEWS Interventions Administered prn meds/treatments  Take Vital Signs  Increase Vital Sign Frequency  Yellow: Q 2hr X 2 then Q 4hr X 2, if remains yellow, continue Q 4hrs  Escalate  MEWS: Escalate Yellow: discuss with charge nurse/RN and consider discussing with provider and RRT  Notify: Charge Nurse/RN  Name of Charge Nurse/RN Notified Tom  Date Charge Nurse/RN Notified 03/15/20  Time Charge Nurse/RN Notified 0415  Document  Patient Outcome Stabilized after interventions  Progress note created (see row info) Yes

## 2020-03-15 NOTE — Progress Notes (Signed)
Continues to decline nocturnal CPAP 

## 2020-03-15 NOTE — Progress Notes (Signed)
PROGRESS NOTE  Jasmine Hood  RFF:[id number] DOB: [birth year] DOA: 03/09/2020 PCP: [name], [name], MD  Outpatient Specialists: Oncology, Dr. [name] Brief Narrative: Jasmine Hood is a 74 y.o. 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 She had been exposed to her daughter who had covid. CTA chest showed no PE, but did have some opacities (infiltrate vs. chronic disease and post-radiation) and SARS-CoV-2 PCR was positive. She also had head imaging concerning for metastasis. MRI brain revealed acute ischemic L cerebellar infarct and concern for tiny subacute nonhemorrhagic infarcts in the R centrum ovale. There was concern for possible hemorrhagic right frontal metastasis for which neurology was consulted. She developed a fever on 10/24 and 10/25. Blood cultures drawn 10/25 grew Strep mitis/orabilis in 2 of 2 bottles. ID consulted and the patient continues on ceftriaxone.   Assessment & Plan: Principal Problem:   Weakness Active Problems:   Hypothyroidism   Essential hypertension   OSA (obstructive sleep apnea)   Malignant neoplasm of overlapping sites of right breast in female, estrogen receptor positive (Jasmine Hood)   Bone metastases (Jasmine Hood)   Stroke ([place])   COVID-19  Covid-19 infection: +SARS-CoV-2 PCR on admission screening 10/21, s/p J&J vaccine, now s/p monoclonal antibody (regeneron) 10/22. Remains on room air - Remain on isolation for 10 days from positive testing.  Sepsis due to Streptococcal bacteremia: +Cx 10/25, continues to have GPC in Cx 10/26. Recurrently febrile 10/27. - D/w ID, Dr. Baxter Flattery. TTE 10/22 (prior to fevers) showed no vegetations. Pt declines TEE. - Continue ceftriaxone, may convert to PCN based on susceptibilities.   Left cerebellar infarct, multiple other infarcts, hemorrhagic right frontal focus: Now concern for septic  emboli with recurrently positive blood cultures with multiple territories affected.  - Neurology, Dr. Leonel Ramsay, recommends outpatient neurology follow up, outpatient cardiac monitoring (due to possibility of cardioembolic/nonspetic emboli) and baby aspirin.    Hypothyroidism: Pt not taking low dose synthroid. TSH 5.611 on 10/22.  - Recommend PCP, continue synthroid 7mcg here.  Metastatic breast CA: Sclerotic calvarial lesions noted on CT.  - Dr. [name] aware of admission.  - Continue anastrozole  - Will consult palliative care  HTN:  - No changes currently  OSA:  - CPAP qHS  Thrombocytopenia: Stable.  Hyponatremia: s/p IVF.   Pyuria: Nonclonal urine culture.   Obesity: Estimated body mass index is 45.06 kg/m as calculated from the following:   Height as of this encounter: 4\' 11"  (1.499 m).   Weight as of this encounter: 101.2 kg.  DVT prophylaxis: SCDs  Code Status: DNR Family Communication: None at bedside, declined offer to call.  Disposition Plan:  Status is: Inpatient  Remains inpatient appropriate because:Ongoing diagnostic testing needed not appropriate for outpatient work up and Inpatient level of care appropriate due to severity of illness  Dispo: The patient is from: Home              Anticipated d/c is to: Home              Anticipated d/c date is: 3 days              Patient currently is not medically stable to d/c.  Consultants:   ID  Neurology  Procedures:   Echocardiogram  Antimicrobials:  Ceftriaxone 10/26 >>   Subjective: Feels "fine" did have feeling of warmth this morning with temperature >102. No  chest pain or dyspnea. Wants to go home ASAP.  Objective: Vitals:   03/15/20 0412 03/15/20 0619 03/15/20 0758 03/15/20 1154  BP: (!) 162/75 (!) 132/101 (!) 159/56 (!) 176/59  Pulse: (!) 105 100 98 96  Resp: 18 18 18 16   Temp: (!) 102.4 F (39.1 C) 98.5 F (36.9 C) 99.2 F (37.3 C) 98.6 F (37 C)  TempSrc:  Oral Oral Oral  SpO2:  94% 92% 93% 93%  Weight:      Height:        Intake/Output Summary (Last 24 hours) at 03/15/2020 1531 Last data filed at 03/15/2020 1305 Gross per 24 hour  Intake 808.19 ml  Output 200 ml  Net 608.19 ml   Filed Weights   03/12/20 0500 03/13/20 0500 03/14/20 0500  Weight: 103.7 kg 107.8 kg 101.2 kg    Gen: 74 y.o. female in no distress Pulm: Non-labored breathing. Clear to auscultation bilaterally.  CV: Regular rate and rhythm. No murmur, rub, or gallop. UTD JVD, No pitting pedal edema. GI: Abdomen soft, non-tender, non-distended, with normoactive bowel sounds. No organomegaly or masses felt. Ext: Warm, no deformities Skin: No rashes, lesions or ulcers Neuro: Alert and oriented. No focal neurological deficits. Psych: Judgement and insight appear normal. Mood & affect appropriate.   Data Reviewed: I have personally reviewed following labs and imaging studies  CBC: Recent Labs  Lab 03/09/20 1245 03/09/20 1245 03/10/20 0604 03/11/20 0408 03/13/20 0615 03/14/20 0333 03/15/20 0355  WBC 9.5   < > 6.8 4.0 4.1 4.1 5.0  NEUTROABS 7.7  --   --  2.9 3.0 2.8 4.1  HGB 11.9*   < > 11.0* 10.4* 10.5* 10.2* 11.2*  HCT 35.1*   < > 33.4* 31.6* 32.0* 31.2* 34.2*  MCV 104.5*   < > 105.7* 106.0* 108.1* 107.2* 106.5*  PLT 124*   < > 124* 113* 97* 100* 99*   < > = values in this interval not displayed.   Basic Metabolic Panel: Recent Labs  Lab 03/09/20 1245 03/09/20 1245 03/10/20 0604 03/11/20 0408 03/13/20 0615 03/14/20 0333 03/15/20 0355  NA 133*  --   --  136 135 136 133*  K 4.1  --   --  4.1 4.5 3.7 4.1  CL 101  --   --  103 102 100 101  CO2 23  --   --  24 25 27 23   GLUCOSE 150*  --   --  138* 128* 125* 153*  BUN 10  --   --  10 10 10 10   CREATININE 0.60   < > 0.63 0.51 0.56 0.52 0.54  CALCIUM 7.5*  --   --  7.5* 7.7* 7.8* 7.8*  MG  --   --   --  2.0 1.8 1.8 1.8  PHOS  --   --   --  2.5 3.8 3.7 3.0   < > = values in this interval not displayed.   GFR: Estimated  Creatinine Clearance: 62.7 mL/min (by C-G formula based on SCr of 0.54 mg/dL). Liver Function Tests: Recent Labs  Lab 03/09/20 1245 03/11/20 0408 03/13/20 0615 03/14/20 0333 03/15/20 0355  AST 96* 84* 81* 77* 86*  ALT 33 31 38 40 41  ALKPHOS 105 106 114 127* 139*  BILITOT 1.2 0.7 1.0 0.8 0.9  PROT 6.8 5.7* 5.7* 5.9* 6.4*  ALBUMIN 2.5* 2.1* 2.0* 2.2* 2.3*   No results for input(s): LIPASE, AMYLASE in the last 168 hours. No results for input(s): AMMONIA in the last  168 hours. Coagulation Profile: No results for input(s): INR, PROTIME in the last 168 hours. Cardiac Enzymes: No results for input(s): CKTOTAL, CKMB, CKMBINDEX, TROPONINI in the last 168 hours. BNP (last 3 results) No results for input(s): PROBNP in the last 8760 hours. HbA1C: No results for input(s): HGBA1C in the last 72 hours. CBG: Recent Labs  Lab 03/09/20 1206  GLUCAP 148*   Lipid Profile: No results for input(s): CHOL, HDL, LDLCALC, TRIG, CHOLHDL, LDLDIRECT in the last 72 hours. Thyroid Function Tests: No results for input(s): TSH, T4TOTAL, FREET4, T3FREE, THYROIDAB in the last 72 hours. Anemia Panel: No results for input(s): VITAMINB12, FOLATE, FERRITIN, TIBC, IRON, RETICCTPCT in the last 72 hours. Urine analysis:    Component Value Date/Time   COLORURINE YELLOW 03/13/2020 2000   APPEARANCEUR HAZY (A) 03/13/2020 2000   LABSPEC 1.008 03/13/2020 2000   PHURINE 7.0 03/13/2020 2000   GLUCOSEU NEGATIVE 03/13/2020 2000   GLUCOSEU NEGATIVE 01/25/2010 0858   HGBUR MODERATE (A) 03/13/2020 2000   HGBUR 1+ 12/30/2008 0838   BILIRUBINUR NEGATIVE 03/13/2020 2000   BILIRUBINUR Neg 04/28/2015 1122   KETONESUR NEGATIVE 03/13/2020 2000   PROTEINUR NEGATIVE 03/13/2020 2000   UROBILINOGEN 0.2 04/28/2015 1122   UROBILINOGEN 0.2 01/25/2010 0858   NITRITE NEGATIVE 03/13/2020 2000   LEUKOCYTESUR TRACE (A) 03/13/2020 2000   Recent Results (from the past 240 hour(s))  Respiratory Panel by RT PCR (Flu A&B, Covid) -      Status: Abnormal   Collection Time: 03/09/20  3:41 PM   Specimen: Nasopharyngeal  Result Value Ref Range Status   SARS Coronavirus 2 by RT PCR POSITIVE (A) NEGATIVE Final    Comment: RESULT CALLED TO, READ BACK BY AND VERIFIED WITH: R.GROVES AT [place] ON 03/09/20 BY N.THOMPSON (NOTE) SARS-CoV-2 target nucleic acids are DETECTED.  SARS-CoV-2 RNA is generally detectable in upper respiratory specimens  during the acute phase of infection. Positive results are indicative of the presence of the identified virus, but do not rule out bacterial infection or co-infection with other pathogens not detected by the test. Clinical correlation with patient history and other diagnostic information is necessary to determine patient infection status. The expected result is Negative.  Fact Sheet for Patients:  PinkCheek.be  Fact Sheet for Healthcare Providers: GravelBags.it  This test is not yet approved or cleared by the [place] FDA and  has been authorized for detection and/or diagnosis of SARS-CoV-2 by FDA under an Emergency Use Authorization (EUA).  This EUA will remain in effect (meaning this test  can be used) for the duration of  the COVID-19 declaration under Section 564(b)(1) of the Act, 21 U.S.C. section 360bbb-3(b)(1), unless the authorization is terminated or revoked sooner.      Influenza A by PCR NEGATIVE NEGATIVE Final   Influenza B by PCR NEGATIVE NEGATIVE Final    Comment: (NOTE) The Xpert Xpress SARS-CoV-2/FLU/RSV assay is intended as an aid in  the diagnosis of influenza from Nasopharyngeal swab specimens and  should not be used as a sole basis for treatment. Nasal washings and  aspirates are unacceptable for Xpert Xpress SARS-CoV-2/FLU/RSV  testing.  Fact Sheet for Patients: PinkCheek.be  Fact Sheet for Healthcare Providers: GravelBags.it  This test  is not yet approved or cleared by the [place] FDA and  has been authorized for detection and/or diagnosis of SARS-CoV-2 by  FDA under an Emergency Use Authorization (EUA). This EUA will remain  in effect (meaning this test can be used) for the duration of the  Covid-19  declaration under Section 564(b)(1) of the Act, 21  U.S.C. section 360bbb-3(b)(1), unless the authorization is  terminated or revoked. Performed at [facility], [place] [address], [place], [place] [address]   Culture, Urine     Status: Abnormal   Collection Time: 03/09/20  3:41 PM   Specimen: Urine, Clean Catch  Result Value Ref Range Status   Specimen Description   Final    URINE, CLEAN CATCH Performed at [facility], [place] [address], [place], [place] [address]    Special Requests   Final    NONE Performed at [facility], [place] [address], [place], [place] [address]    Culture MULTIPLE SPECIES PRESENT, SUGGEST RECOLLECTION (A)  Final   Report Status 03/11/2020 FINAL  Final  Culture, blood (routine x 2)     Status: None (Preliminary result)   Collection Time: 03/13/20  2:53 PM   Specimen: BLOOD LEFT FOREARM  Result Value Ref Range Status   Specimen Description   Final    BLOOD LEFT FOREARM Performed at [facility] [place] [address] [place], [place] [address]    Special Requests   Final    BOTTLES DRAWN AEROBIC AND ANAEROBIC Blood Culture adequate volume Performed at [place] [address], [place], [place] [address]    Culture  Setup Time   Final    GRAM POSITIVE COCCI IN CHAINS IN BOTH AEROBIC AND ANAEROBIC BOTTLES CRITICAL RESULT CALLED TO, READ BACK BY AND VERIFIED WITH: E. WILLIAMSON,PHARMD 0600 03/14/2020 T. TYSOR    Culture   Final    GRAM POSITIVE COCCI IDENTIFICATION AND SUSCEPTIBILITIES TO FOLLOW Performed at [facility], [place] [address] [place], [place] [address]    Report Status PENDING   Incomplete  Blood Culture ID Panel (Reflexed)     Status: Abnormal   Collection Time: 03/13/20  2:53 PM  Result Value Ref Range Status   Enterococcus faecalis NOT DETECTED NOT DETECTED Final   Enterococcus Faecium NOT DETECTED NOT DETECTED Final   Listeria monocytogenes NOT DETECTED NOT DETECTED Final   Staphylococcus species NOT DETECTED NOT DETECTED Final   Staphylococcus aureus (BCID) NOT DETECTED NOT DETECTED Final   Staphylococcus epidermidis NOT DETECTED NOT DETECTED Final   Staphylococcus lugdunensis NOT DETECTED NOT DETECTED Final   Streptococcus species DETECTED (A) NOT DETECTED Final    Comment: Not Enterococcus species, Streptococcus agalactiae, Streptococcus pyogenes, or Streptococcus pneumoniae. CRITICAL RESULT CALLED TO, READ BACK BY AND VERIFIED WITH: E. WILLIAMSON,PHARMD 0600 03/14/2020 T. TYSOR    Streptococcus agalactiae NOT DETECTED NOT DETECTED Final   Streptococcus pneumoniae NOT DETECTED NOT DETECTED Final   Streptococcus pyogenes NOT DETECTED NOT DETECTED Final   A.calcoaceticus-baumannii NOT DETECTED NOT DETECTED Final   Bacteroides fragilis NOT DETECTED NOT DETECTED Final   Enterobacterales NOT DETECTED NOT DETECTED Final   Enterobacter cloacae complex NOT DETECTED NOT DETECTED Final   Escherichia coli NOT DETECTED NOT DETECTED Final   Klebsiella aerogenes NOT DETECTED NOT DETECTED Final   Klebsiella oxytoca NOT DETECTED NOT DETECTED Final   Klebsiella pneumoniae NOT DETECTED NOT DETECTED Final   Proteus species NOT DETECTED NOT DETECTED Final   Salmonella species NOT DETECTED NOT DETECTED Final   Serratia marcescens NOT DETECTED NOT DETECTED Final   Haemophilus influenzae NOT DETECTED NOT DETECTED Final   Neisseria meningitidis NOT DETECTED NOT DETECTED Final   Pseudomonas aeruginosa NOT DETECTED NOT DETECTED Final   Stenotrophomonas maltophilia NOT DETECTED NOT DETECTED Final  Candida albicans NOT DETECTED NOT DETECTED Final   Candida auris NOT DETECTED  NOT DETECTED Final   Candida glabrata NOT DETECTED NOT DETECTED Final   Candida krusei NOT DETECTED NOT DETECTED Final   Candida parapsilosis NOT DETECTED NOT DETECTED Final   Candida tropicalis NOT DETECTED NOT DETECTED Final   Cryptococcus neoformans/gattii NOT DETECTED NOT DETECTED Final    Comment: Performed at [facility], [place] [address], [place], [place] [address]  Culture, Urine     Status: Abnormal   Collection Time: 03/13/20  8:00 PM   Specimen: Urine, Random  Result Value Ref Range Status   Specimen Description   Final    URINE, RANDOM Performed at [place] [address] [place], [place] [address]    Special Requests   Final    NONE Performed at [facility], [place] [address], [place], [place] [address]    Culture MULTIPLE SPECIES PRESENT, SUGGEST RECOLLECTION (A)  Final   Report Status 03/15/2020 FINAL  Final  Culture, blood (routine x 2)     Status: Abnormal (Preliminary result)   Collection Time: 03/14/20  4:28 AM   Specimen: BLOOD LEFT HAND  Result Value Ref Range Status   Specimen Description BLOOD LEFT HAND  Final   Special Requests   Final    BOTTLES DRAWN AEROBIC ONLY Blood Culture results may not be optimal due to an inadequate volume of blood received in culture bottles   Culture  Setup Time   Final    AEROBIC BOTTLE ONLY GRAM POSITIVE COCCI IN CHAINS CRITICAL VALUE NOTED.  VALUE IS CONSISTENT WITH PREVIOUSLY REPORTED AND CALLED VALUE. Performed at [facility], [place] [address], [place], [place] [address]    Culture STREPTOCOCCUS MITIS/ORALIS (A)  Final   Report Status PENDING  Incomplete  Culture, blood (routine x 2)     Status: None (Preliminary result)   Collection Time: 03/14/20  5:57 AM   Specimen: BLOOD  Result Value Ref Range Status   Specimen Description   Final    BLOOD LEFT ARM Performed at [place] [address] [place], [place] [address]    Special Requests    Final    BOTTLES DRAWN AEROBIC ONLY Performed at [place] [address], [place], [place] [address]    Culture  Setup Time   Final    AEROBIC BOTTLE ONLY GRAM POSITIVE COCCI CRITICAL VALUE NOTED.  VALUE IS CONSISTENT WITH PREVIOUSLY REPORTED AND CALLED VALUE.    Culture   Final    NO GROWTH < 24 HOURS Performed at [facility], [place] [address], [place], [place] [address]    Report Status PENDING  Incomplete      Radiology Studies: MR BRAIN W WO CONTRAST  Result Date: 03/15/2020 CLINICAL DATA:  Possible hemorrhagic metastasis. EXAM: MRI HEAD WITHOUT AND WITH CONTRAST TECHNIQUE: Multiplanar, multiecho pulse sequences of the brain and surrounding structures were obtained without and with intravenous contrast. CONTRAST:  48mL GADAVIST GADOBUTROL 1 MMOL/ML IV SOLN COMPARISON:  Head CT 03/10/2020 FINDINGS: Brain: Unchanged appearance of small left cerebellar infarct. There are new foci of abnormal diffusion restriction within the right corona radiata and frontal lobe. Multifocal hyperintense T2-weighted signal within the white matter. Normal volume of CSF spaces. Punctate focus hemorrhage in the right temporal lobe is unchanged. There are no contrast-enhancing lesions. Normal midline structures. Vascular: Normal flow voids. Skull and upper cervical spine: Multiple hypointense T1-weighted signal lesions  of the calvarium are unchanged. Sinuses/Orbits: Negative. Other: None. IMPRESSION: 1. Multiple new foci of abnormal diffusion restriction within the right corona radiata and frontal lobe, consistent with acute to subacute infarcts. No hemorrhage or mass effect. 2. 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 3. Unchanged punctate focus of hemorrhage in the right temporal lobe. No abnormal contrast enhancement. Electronically Signed   By: Ulyses Jarred M.D.   On: 03/15/2020 00:16   DG CHEST PORT 1 VIEW  Result Date: 03/14/2020 CLINICAL DATA:  COVID pneumonia.  EXAM: PORTABLE CHEST 1 VIEW COMPARISON:  One-view chest x-ray 03/13/2020 FINDINGS: Heart size exaggerated by low lung volumes. Interstitial and airspace opacities are again noted, greatest in the right upper lobe and left lower lobe. Overall aeration is improving. Degenerative changes are noted at the shoulders. IMPRESSION: Improving aeration with persistent interstitial and airspace disease in the right upper lobe and left lower lobe. Electronically Signed   By: San Morelle M.D.   On: 03/14/2020 06:06    Scheduled Meds: .  stroke: mapping our early stages of recovery book   Does not apply Once  . anastrozole  1 mg Oral Daily  . levothyroxine  25 mcg Oral Q0600  . pantoprazole  40 mg Oral Daily  . polyethylene glycol  17 g Oral BID  . Ensure Max Protein  11 oz Oral BID   Continuous Infusions: . cefTRIAXone (ROCEPHIN)  IV       LOS: 5 days   Time spent: 35 minutes.  Patrecia Pour, MD [facility] [email or web address] 03/15/2020, 3:31 PM

## 2020-03-15 NOTE — Progress Notes (Signed)
[facility] for Infectious Disease    Date of Admission:  03/09/2020   Total days of antibiotics 2           ID: Jasmine Hood is a 74 y.o. female with covid-19, CNS infarct in setting of streptococcal bacteremia Principal Problem:   Weakness Active Problems:   Hypothyroidism   Essential hypertension   OSA (obstructive sleep apnea)   Malignant neoplasm of overlapping sites of right breast in female, estrogen receptor positive ([facility])   Bone metastases ([place])   Stroke ([place])   COVID-19    Subjective: Feeling better, but still had chills and fever yesterday.   Medications:  .  stroke: mapping our early stages of recovery book   Does not apply Once  . anastrozole  1 mg Oral Daily  . [START ON 03/16/2020] aspirin  81 mg Oral Daily  . levothyroxine  25 mcg Oral Q0600  . pantoprazole  40 mg Oral Daily  . polyethylene glycol  17 g Oral BID  . Ensure Max Protein  11 oz Oral BID    Objective: Vital signs in last 24 hours: Temp:  [98.4 F (36.9 C)-102.4 F (39.1 C)] 98.8 F (37.1 C) (10/27 1618) Pulse Rate:  [91-105] 92 (10/27 1618) Resp:  [16-21] 18 (10/27 1618) BP: (132-176)/(56-101) 153/65 (10/27 1618) SpO2:  [92 %-94 %] 94 % (10/27 1618) Physical Exam  Constitutional:  oriented to person, place, and time. appears well-developed and well-nourished. No distress.  HENT: [place]/AT, PERRLA, no scleral icterus Mouth/Throat: Oropharynx is clear and moist. No oropharyngeal exudate.  Cardiovascular: Normal rate, regular rhythm and normal heart sounds. Exam reveals no gallop and no friction rub.  No murmur heard.  Pulmonary/Chest: Effort normal and breath sounds normal. No respiratory distress.  has no wheezes.  Neck = supple, no nuchal rigidity Abdominal: Soft. Bowel sounds are normal.  exhibits no distension. There is no tenderness.  Lymphadenopathy: no cervical adenopathy. No axillary adenopathy Neurological: alert and oriented to person, place, and time.  Skin: Skin is warm  and dry. No rash noted. No erythema.  Psychiatric: a normal mood and affect.  behavior is normal.    Lab Results Recent Labs    03/14/20 0333 03/15/20 0355  WBC 4.1 5.0  HGB 10.2* 11.2*  HCT 31.2* 34.2*  NA 136 133*  K 3.7 4.1  CL 100 101  CO2 27 23  BUN 10 10  CREATININE 0.52 0.54   Liver Panel Recent Labs    03/14/20 0333 03/15/20 0355  PROT 5.9* 6.4*  ALBUMIN 2.2* 2.3*  AST 77* 86*  ALT 40 41  ALKPHOS 127* 139*  BILITOT 0.8 0.9   Sedimentation Rate No results for input(s): ESRSEDRATE in the last 72 hours. C-Reactive Protein No results for input(s): CRP in the last 72 hours.  Microbiology:  Studies/Results: MR BRAIN W WO CONTRAST  Result Date: 03/15/2020 CLINICAL DATA:  Possible hemorrhagic metastasis. EXAM: MRI HEAD WITHOUT AND WITH CONTRAST TECHNIQUE: Multiplanar, multiecho pulse sequences of the brain and surrounding structures were obtained without and with intravenous contrast. CONTRAST:  60mL GADAVIST GADOBUTROL 1 MMOL/ML IV SOLN COMPARISON:  Head CT 03/10/2020 FINDINGS: Brain: Unchanged appearance of small left cerebellar infarct. There are new foci of abnormal diffusion restriction within the right corona radiata and frontal lobe. Multifocal hyperintense T2-weighted signal within the white matter. Normal volume of CSF spaces. Punctate focus hemorrhage in the right temporal lobe is unchanged. There are no contrast-enhancing lesions. Normal midline structures. Vascular: Normal flow voids.  Skull and upper cervical spine: Multiple hypointense T1-weighted signal lesions of the calvarium are unchanged. Sinuses/Orbits: Negative. Other: None. IMPRESSION: 1. Multiple new foci of abnormal diffusion restriction within the right corona radiata and frontal lobe, consistent with acute to subacute infarcts. No hemorrhage or mass effect. 2. 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 3. Unchanged punctate focus of hemorrhage in the right temporal lobe. No abnormal  contrast enhancement. Electronically Signed   By: Ulyses Jarred M.D.   On: 03/15/2020 00:16   DG CHEST PORT 1 VIEW  Result Date: 03/14/2020 CLINICAL DATA:  COVID pneumonia. EXAM: PORTABLE CHEST 1 VIEW COMPARISON:  One-view chest x-ray 03/13/2020 FINDINGS: Heart size exaggerated by low lung volumes. Interstitial and airspace opacities are again noted, greatest in the right upper lobe and left lower lobe. Overall aeration is improving. Degenerative changes are noted at the shoulders. IMPRESSION: Improving aeration with persistent interstitial and airspace disease in the right upper lobe and left lower lobe. Electronically Signed   By: San Morelle M.D.   On: 03/14/2020 06:06     Assessment/Plan: Streptococcal bacteremia = concern that her CNS infarcts are cardiac embolic phenomenon, since still febrile, and bacteremic. Patient is not willing to get TEE. Recommend to increase ceftriaxone to 2gm IV Q 12 hr for now. Await sensitivities to see if can transition to penicillin. Anticipate that she will need 4 wk course of iv abtx  Repeat blood cx to document clearance before placing picc line.  [facility] for Infectious Diseases Cell: [phone] Pager: [phone]  03/15/2020, 5:31 PM

## 2020-03-15 NOTE — Progress Notes (Signed)
Physical Therapy Treatment Patient Details Name: Jasmine Hood MRN: [id number] DOB: [birth year] Today's Date: 03/15/2020    History of Present Illness Patient is a 74 year old female medical history significant of metastatic breast cancer, with metastasis to the thoracic spine. Patient was brought to the hospital due to generalized weakness. Patient tested positive for COVID in ED. Pt MRI reads: 9 mm acute ischemic nonhemorrhagic left cerebellar infarct, 7 mm focus of susceptibility artifact involving the right temporal lobe concerning for possible hemorrhagic metastasis.     PT Comments    Patient making steady progress with acute PT. She was able to ambulate short distance in room to bathroom on RA and sats ranged from 82-92%. Pt requesting to wash up and despite encouragement to perform some care herself she required MAX assist to complete pericare cleansing of abdominal folds. Pt was able to mobilize with min guard/assist for gait and transfer this session with no overt LOB noted. Pt fatigued at EOS. She will continue to benefit from skilled PT interventions to progress mobility and independence.     Follow Up Recommendations  Home health PT;Supervision/Assistance - 24 hour;SNF (depending on progress and assist available at home)     Equipment Recommendations  None recommended by PT    Recommendations for Other Services       Precautions / Restrictions Precautions Precautions: Fall Precaution Comments: monitor sats Restrictions Weight Bearing Restrictions: No    Mobility  Bed Mobility Overal bed mobility: Needs Assistance Bed Mobility: Supine to Sit;Sit to Supine     Supine to sit: Mod assist;HOB elevated Sit to supine: Mod assist   General bed mobility comments: Pt able to initiate bringing LE's off EOB and rolling to Lt side, Mod assist required to raise trunk up and fully bring LE off edge. Pt required greater level of assist to return to supine and did not intiate  raising LE's onto bed.  Transfers Overall transfer level: Needs assistance Equipment used: Rolling walker (2 wheeled) Transfers: Sit to/from Stand Sit to Stand: Min guard;Min assist         General transfer comment: Min guard to light assist for safety, pt standing from EOB and BSC in bathroom. No cues needed for safe hand placement with RW.   Ambulation/Gait Ambulation/Gait assistance: Min assist Gait Distance (Feet): 24 Feet (in room to bathroom and back to bed) Assistive device: Rolling walker (2 wheeled) Gait Pattern/deviations: Step-through pattern;Decreased stride length;Trunk flexed;Wide base of support Gait velocity: decr   General Gait Details: min assist to steady intermittently, pt amb to bathroom and stood at sink for bil UE support during pericare/bathing. Pt required assist to negotiate walker in bathroom and return to walk to bed.    Stairs             Wheelchair Mobility    Modified Rankin (Stroke Patients Only)       Balance Overall balance assessment: Needs assistance Sitting-balance support: Feet supported Sitting balance-Leahy Scale: Good     Standing balance support: During functional activity;Bilateral upper extremity supported Standing balance-Leahy Scale: Poor Standing balance comment: pt reliant on external support. Pt requried MAX assist for pericare in bathroom and declined to attempt washing front despite encouragement.                             Cognition Arousal/Alertness: Awake/alert Behavior During Therapy: WFL for tasks assessed/performed Overall Cognitive Status: Within Functional Limits for tasks assessed  Exercises      General Comments General comments (skin integrity, edema, etc.): pt on RA and sats dropped to 82% but recovered to 84-92% while standing in bathroom to wash up.       Pertinent Vitals/Pain Pain Assessment: No/denies pain Pain  Intervention(s): Monitored during session    Home Living                      Prior Function            PT Goals (current goals can now be found in the care plan section) Acute Rehab PT Goals Patient Stated Goal: go home with daughter PT Goal Formulation: With patient Time For Goal Achievement: 03/25/20 Potential to Achieve Goals: Good Progress towards PT goals: Progressing toward goals    Frequency    Min 3X/week      PT Plan Current plan remains appropriate;Discharge plan needs to be updated    Co-evaluation              AM-PAC PT "6 Clicks" Mobility   Outcome Measure  Help needed turning from your back to your side while in a flat bed without using bedrails?: A Lot Help needed moving from lying on your back to sitting on the side of a flat bed without using bedrails?: A Lot Help needed moving to and from a bed to a chair (including a wheelchair)?: A Little Help needed standing up from a chair using your arms (e.g., wheelchair or bedside chair)?: A Little Help needed to walk in hospital room?: A Little Help needed climbing 3-5 steps with a railing? : Total 6 Click Score: 14    End of Session Equipment Utilized During Treatment: Gait belt Activity Tolerance: Patient tolerated treatment well Patient left: in bed;with call bell/phone within reach;with bed alarm set Nurse Communication: Mobility status PT Visit Diagnosis: Muscle weakness (generalized) (M62.81);Difficulty in walking, not elsewhere classified (R26.2);Unsteadiness on feet (R26.81)     Time: 2979-8921 PT Time Calculation (min) (ACUTE ONLY): 31 min  Charges:  $Gait Training: 8-22 mins $Therapeutic Activity: 8-22 mins                     Verner Mould, DPT Acute Rehabilitation Services  Office [phone] Pager [phone]  03/15/2020 6:28 PM

## 2020-03-16 DIAGNOSIS — E038 Other specified hypothyroidism: Secondary | ICD-10-CM | POA: Diagnosis not present

## 2020-03-16 DIAGNOSIS — Z7189 Other specified counseling: Secondary | ICD-10-CM

## 2020-03-16 DIAGNOSIS — R531 Weakness: Secondary | ICD-10-CM | POA: Diagnosis not present

## 2020-03-16 DIAGNOSIS — I10 Essential (primary) hypertension: Secondary | ICD-10-CM | POA: Diagnosis not present

## 2020-03-16 DIAGNOSIS — C50811 Malignant neoplasm of overlapping sites of right female breast: Secondary | ICD-10-CM | POA: Diagnosis not present

## 2020-03-16 DIAGNOSIS — Z515 Encounter for palliative care: Secondary | ICD-10-CM

## 2020-03-16 DIAGNOSIS — C7951 Secondary malignant neoplasm of bone: Secondary | ICD-10-CM | POA: Diagnosis not present

## 2020-03-16 DIAGNOSIS — U071 COVID-19: Secondary | ICD-10-CM | POA: Diagnosis not present

## 2020-03-16 LAB — CULTURE, BLOOD (ROUTINE X 2): Special Requests: ADEQUATE

## 2020-03-16 MED ORDER — PENICILLIN G POTASSIUM 20000000 UNITS IJ SOLR
12.0000 10*6.[IU] | Freq: Two times a day (BID) | INTRAVENOUS | Status: DC
  Administered 2020-03-16 – 2020-03-19 (×5): 12 10*6.[IU] via INTRAVENOUS
  Filled 2020-03-16 (×6): qty 12

## 2020-03-16 NOTE — Progress Notes (Signed)
Pt has declined use of CPAP QHS this admission.

## 2020-03-16 NOTE — Consult Note (Signed)
Consultation Note Date: 03/16/2020   Patient Name: Jasmine Hood  DOB: Aug 14, 1945  MRN: [id number]  Age / Sex: 74 y.o., female  PCP: [name], [name], MD Referring Physician: Patrecia Pour, MD  Reason for Consultation: Establishing goals of care  HPI/Patient Profile: 74 y.o. female  with past medical history of breast cancer with bony metastasis to the calvarium, manubrium, proximal left humerus, thoracic and lumbar spine-currently on treatment with anastrozole and Xgeva, interstitial lung disease which was noted on January 2020 CT scan and follow-up with pulmonologist was recommended, hepatic cirrhosis admitted on 03/09/2020 with COVID-19 positivity, she also has positive blood cultures, and findings of acute ischemic left cerebellar infarct and concern for tiny subacute nonhemorrhagic infarcts, there is also concern for possible hemorrhagic right frontal metastasis-she does not appear to have any current deficits from these.  Palliative medicine consulted for goals of care. .   Clinical Assessment and Goals of Care: I met with patient in her room.  She was lying in bed tells me that she is having pain in her back from her cancer metastasis.  She feels very sick. We discussed goals of care in advance care planning.   She has advanced directives and DNR currently in place. Jasmine Hood lives at home with her daughter Jasmine Hood.  She tells me that prior to admission she was living independently ambulating well and no problems with her nutrition. I noted on her admission there was mention that she was possibly pursuing hospice.  She tells me that she is not ready for hospice yet and that she just met that when it was time for her to die she wanted hospice to "put a patch on her" so that she could just drift away. I asked her what she thought her definition of the right time for hospice would be.  She noted that if  she was having significant uncontrolled pain that is definitely not the way she would want to live and then she would be ready for hospice.  She also tells me if she was not able to function and live independently then she would consider hospice.  She does not want to go to a nursing facility ever not even short-term for rehab.  Her goals are to live at home independently for as long as possible. As for her current medical situation she wishes to continue treatment in efforts to treat what is treatable.  She wishes to continue on IV antibiotics and all current medical treatments. If there was some treatment offered for her possible brain metastasis for her cancer she would pursue it.     Primary Decision Maker PATIENT    SUMMARY OF RECOMMENDATIONS -Patient currently has DNR in place -Continue current scope of care -Patient would prefer to discharge home with physical therapy would not be interested in going to SNF even for short-term rehab placement -Jasmine Hood would be interested in discussing if there were further treatments for her cancer if it has progressed and if she could tolerate treatments, in the event  is determined that she could not tolerate further treatments or that she was dying from another illness then she would prefer hospice and to be kept comfortable -I offered to discuss pain management with her regarding her bone metastasis, however she tells me she is not interested in pain medication -PMT will continue to follow for progress versus decline in discuss goals of care as needed   Code Status/Advance Care Planning:  DNR  Prognosis:    Unable to determine  Discharge Planning: Home with Home Health  Primary Diagnoses: Present on Admission: . Hypothyroidism . Essential hypertension . OSA (obstructive sleep apnea) . Bone metastases ([place]) . Stroke ([place]) . COVID-19   I have reviewed the medical record, interviewed the patient and family, and examined the patient. The  following aspects are pertinent.  Past Medical History:  Diagnosis Date  . Breast cancer ([place]) 10/07/2016   right breast, spine  . Brown recluse spider bite    right leg  . COLONIC POLYPS, HX OF 01/05/2009   Qualifier: Diagnosis of  By: [name]  MD, [name]   . Complication of anesthesia    slow to awaken after wisdom  teeth extraction age 59  . Constipation   . Genetic testing 11/27/2016   Ms. Hoch underwent genetic counseling and testing for hereditary cancer syndromes on 11/19/2016. Her results were negative for mutations in all 46 genes analyzed by [facility]'s 46-gene Common Hereditary Cancers Panel. Genes analyzed include: APC, ATM, AXIN2, BARD1, BMPR1A, BRCA1, BRCA2, BRIP1, CDH1, CDKN2A, CHEK2, CTNNA1, DICER1, EPCAM, GREM1, HOXB13, KIT, MEN1, MLH1, MSH2, MSH3, MSH6, MUTYH, NBN,  . Hypothyroidism   . Morbid obesity ([place]) 01/05/2009   Qualifier: Diagnosis of  By: [name]  MD, [name]   . Pneumonia 1987  . Sleep apnea    no cpap yet, has not recieved results from home test yet  . Stroke [facility])    found on CT scan, no deficits   Social History   Socioeconomic History  . Marital status: Widowed    Spouse name: Not on file  . Number of children: 5  . Years of education: Not on file  . Highest education level: Not on file  Occupational History  . Occupation: retired  Tobacco Use  . Smoking status: Never Smoker  . Smokeless tobacco: Never Used  Vaping Use  . Vaping Use: Never used  Substance and Sexual Activity  . Alcohol use: No  . Drug use: No  . Sexual activity: Not Currently  Other Topics Concern  . Not on file  Social History Narrative  . Not on file   Social Determinants of Health   Financial Resource Strain:   . Difficulty of Paying Living Expenses: Not on file  Food Insecurity:   . Worried About Charity fundraiser in the Last Year: Not on file  . Ran Out of Food in the Last Year: Not on file  Transportation Needs:   . Lack of Transportation  (Medical): Not on file  . Lack of Transportation (Non-Medical): Not on file  Physical Activity:   . Days of Exercise per Week: Not on file  . Minutes of Exercise per Session: Not on file  Stress:   . Feeling of Stress : Not on file  Social Connections:   . Frequency of Communication with Friends and Family: Not on file  . Frequency of Social Gatherings with Friends and Family: Not on file  . Attends Religious Services: Not on file  . Active Member of Clubs  or Organizations: Not on file  . Attends Archivist Meetings: Not on file  . Marital Status: Not on file   Scheduled Meds: .  stroke: mapping our early stages of recovery book   Does not apply Once  . anastrozole  1 mg Oral Daily  . aspirin  81 mg Oral Daily  . levothyroxine  25 mcg Oral Q0600  . pantoprazole  40 mg Oral Daily  . polyethylene glycol  17 g Oral BID  . Ensure Max Protein  11 oz Oral BID   Continuous Infusions: . penicillin g continuous IV infusion     PRN Meds:.acetaminophen **OR** acetaminophen, bisacodyl, morphine injection, ondansetron **OR** ondansetron (ZOFRAN) IV Medications Prior to Admission:  Prior to Admission medications   Medication Sig Start Date End Date Taking? Authorizing Provider  anastrozole (ARIMIDEX) 1 MG tablet Take 1 tablet (1 mg total) by mouth daily. 06/14/19  Yes Magrinat, Virgie Dad, MD  calcium carbonate (TUMS) 500 MG chewable tablet Chew 1 tablet (200 mg of elemental calcium total) by mouth 2 (two) times daily. Patient not taking: Reported on 11/10/2019 05/21/18   Magrinat, Virgie Dad, MD  levothyroxine (SYNTHROID) 25 MCG tablet Take 1 tablet (25 mcg total) by mouth daily at 6 (six) AM. Patient not taking: Reported on 03/09/2020 11/13/19 12/13/19  Harold Hedge, MD   Allergies  Allergen Reactions  . Bee Venom Anaphylaxis  . Latex Swelling   Review of Systems  Constitutional: Positive for activity change and fatigue.    Physical Exam Vitals and nursing note reviewed.    Pulmonary:     Comments: Increased effort Neurological:     Mental Status: She is oriented to person, place, and time.  Psychiatric:        Mood and Affect: Mood normal.        Behavior: Behavior normal.        Thought Content: Thought content normal.        Judgment: Judgment normal.     Vital Signs: BP (!) 154/65 (BP Location: Right Leg)   Hood 88   Temp 98.1 F (36.7 C) (Oral)   Resp 20   Ht 4' 11"  (1.499 m)   Wt 103.1 kg   SpO2 92%   BMI 45.91 kg/m  Pain Scale: 0-10   Pain Score: 0-No pain   SpO2: SpO2: 92 % O2 Device:SpO2: 92 % O2 Flow Rate: .   IO: Intake/output summary:   Intake/Output Summary (Last 24 hours) at 03/16/2020 1710 Last data filed at 03/16/2020 1321 Gross per 24 hour  Intake 1273.72 ml  Output --  Net 1273.72 ml    LBM: Last BM Date: 03/16/20 Baseline Weight: Weight: 102.5 kg Most recent weight: Weight: 103.1 kg     Palliative Assessment/Data: PPS: 50%     Thank you for this consult. Palliative medicine will continue to follow and assist as needed.   Time In: 1608 Time Out: 1734 Time Total: 78 minutes Greater than 50%  of this time was spent counseling and coordinating care related to the above assessment and plan.  Signed by: Mariana Kaufman, AGNP-C Palliative Medicine    Please contact Palliative Medicine Team phone at [phone] for questions and concerns.  For individual provider: See Shea Evans

## 2020-03-16 NOTE — Progress Notes (Signed)
Occupational Therapy Treatment Patient Details Name: Jasmine Hood MRN: [id number] DOB: [birth year] Today's Date: 03/16/2020    History of present illness Patient is a 74 year old female medical history significant of metastatic breast cancer, with metastasis to the thoracic spine. Patient was brought to the hospital due to generalized weakness. Patient tested positive for COVID in ED. Pt MRI reads: 9 mm acute ischemic nonhemorrhagic left cerebellar infarct, 7 mm focus of susceptibility artifact involving the right temporal lobe concerning for possible hemorrhagic metastasis.    OT comments  During today's session patient desaturate to 87-88% on room air with functional ambulation to bathroom and performing sink side ADLs. Patient does recover quickly once seated EOB to 90-91%. Patient had not been desaturating with OT previously with activity therefore educated patient on importance of increasing OOB activity throughout the day and IS use. Patient states recliner is uncomfortable so talked with patient and RN about sitting up to EOB for at least 2 meals a day and making sure she is walking into the bathroom during the day. Also stress importance of using IS multiple times a day as patient states she had used it "once before."     Follow Up Recommendations  Home health OT;Supervision/Assistance - 24 hour    Equipment Recommendations  None recommended by OT       Precautions / Restrictions Precautions Precautions: Fall Precaution Comments: monitor sats       Mobility Bed Mobility Overal bed mobility: Needs Assistance Bed Mobility: Supine to Sit;Sit to Supine     Supine to sit: Mod assist;HOB elevated Sit to supine: Mod assist   General bed mobility comments: mod A to elevate trunk to sitting. mod A to lift LEs onto bed  Transfers Overall transfer level: Needs assistance Equipment used: Rolling walker (2 wheeled) Transfers: Sit to/from Stand Sit to Stand: Min guard          General transfer comment: poor carry over of verbal cues for hand placement not to push up on walker to stand     Balance Overall balance assessment: Needs assistance Sitting-balance support: Feet supported Sitting balance-Leahy Scale: Good     Standing balance support: During functional activity;Bilateral upper extremity supported Standing balance-Leahy Scale: Poor                             ADL either performed or assessed with clinical judgement   ADL Overall ADL's : Needs assistance/impaired     Grooming: Oral care;Wash/dry face;Wash/dry hands;Supervision/safety;Set up;Standing Grooming Details (indicate cue type and reason): patient tolerate standing sink side to wash face/hands and brush teeth requiring assist to put tooth paste on brush. leans onto sink for support                  Toilet Transfer: Min guard;Cueing for safety;Cueing for sequencing;Ambulation;Regular Toilet;Grab bars;RW Armed forces technical officer Details (indicate cue type and reason): cues to hold onto grab bars with eccentric lowering onto toilet and to pull from g.b. to stand Toileting- Clothing Manipulation and Hygiene: Total assistance;Sit to/from stand Toileting - Clothing Manipulation Details (indicate cue type and reason): perianal care after bowel movement, patient reports DTR assists at baseline "I can't reach"     Functional mobility during ADLs: Min guard;Rolling walker;Cueing for safety;Cueing for sequencing General ADL Comments: during today's session patient desats to 87-88% on room air with ADLs'/functional ambulation and recovers to 90-91% seated EOB. instructed patient in IS, patient performed x10 with increase  to 94%. Educate patient on need to increase functional mobility and time OOB for improved endurance and cardiopulmonary status. Patient does not like recliner, instruct patient to have nursing staff assist her to EOB to eat for at least 2 meals a day and also relayed this to  nursing staff. patient agreeable               Cognition Arousal/Alertness: Awake/alert Behavior During Therapy: WFL for tasks assessed/performed Overall Cognitive Status: Within Functional Limits for tasks assessed                                          Exercises Exercises: Other exercises Other Exercises Other Exercises: IS x 10      General Comments see self care section    Pertinent Vitals/ Pain       Pain Assessment: Faces Faces Pain Scale: Hurts little more Pain Location: back Pain Descriptors / Indicators: Sore Pain Intervention(s): Monitored during session         Frequency  Min 2X/week        Progress Toward Goals  OT Goals(current goals can now be found in the care plan section)  Progress towards OT goals: Progressing toward goals  Acute Rehab OT Goals Patient Stated Goal: go home with daughter OT Goal Formulation: With patient Time For Goal Achievement: 03/24/20 Potential to Achieve Goals: Good  Plan Discharge plan remains appropriate       AM-PAC OT "6 Clicks" Daily Activity     Outcome Measure   Help from another person eating meals?: None Help from another person taking care of personal grooming?: A Little Help from another person toileting, which includes using toliet, bedpan, or urinal?: A Lot Help from another person bathing (including washing, rinsing, drying)?: A Lot Help from another person to put on and taking off regular upper body clothing?: A Little Help from another person to put on and taking off regular lower body clothing?: A Lot 6 Click Score: 16    End of Session Equipment Utilized During Treatment: Rolling walker  OT Visit Diagnosis: Other abnormalities of gait and mobility (R26.89);Muscle weakness (generalized) (M62.81);History of falling (Z91.81)   Activity Tolerance Patient tolerated treatment well   Patient Left in bed;with call bell/phone within reach   Nurse Communication Mobility  status;Other (comment) (O2 with activity, assist to EOB for meals)        Time: 2992-4268 OT Time Calculation (min): 33 min  Charges: OT General Charges $OT Visit: 1 Visit OT Treatments $Self Care/Home Management : 23-37 mins  Delbert Phenix OT OT pager: [phone]   Rosemary Holms 03/16/2020, 2:26 PM

## 2020-03-16 NOTE — TOC Initial Note (Signed)
Transition of Care [facility]) - Initial/Assessment Note    Patient Details  Name: Jasmine Hood MRN: [id number] Date of Birth: Mar 25, 1946  Transition of Care [facility]) CM/SW Contact:    Trish Mage, LCSW Phone Number: 03/16/2020, 4:04 PM  Clinical Narrative:   Patient seen in follow up to PT/OT recommendation of [place]; patient is also in need of IV abx.  Jasmine Hood lives at home in [place] with her daughter who has been helping her out for the past 4-5 years.  She states her intent is to return home, and is open to any and all services recommended in the home.  Spoke with daughter who confirmed willingness to continue to care for patient at home.  Contacted Pam with [employer] re: IV abx.  Contacted Cindie with [facility] to find out about capability of providing in home services.  Awaiting call back. TOC will continue to follow during the course of hospitalization.               Expected Discharge Plan: [place] Barriers to Discharge: No Barriers Identified   Patient Goals and CMS Choice     Choice offered to / list presented to : Patient, Adult Children  Expected Discharge Plan and Services Expected Discharge Plan: [place]   Discharge Planning Services: CM Consult Post Acute Care Choice: [place] arrangements for the past 2 months: Single Family Home                                      Prior Living Arrangements/Services Living arrangements for the past 2 months: Single Family Home Lives with:: Adult Children   Do you feel safe going back to the place where you live?: Yes      Need for Family Participation in Patient Care: Yes (Comment) Care giver support system in place?: Yes (comment) Current home services: DME Criminal Activity/Legal Involvement Pertinent to Current Situation/Hospitalization: Yes - Comment as needed  Activities of Daily Living Home Assistive Devices/Equipment: Eyeglasses, Environmental consultant (specify type), Other  (Comment) (front wheeled walker, 4 wheeled walker) ADL Screening (condition at time of admission) Patient's cognitive ability adequate to safely complete daily activities?: No Is the patient deaf or have difficulty hearing?: No Does the patient have difficulty seeing, even when wearing glasses/contacts?: No Does the patient have difficulty concentrating, remembering, or making decisions?: Yes Patient able to express need for assistance with ADLs?: Yes Does the patient have difficulty dressing or bathing?: Yes Independently performs ADLs?: No Communication: Independent Dressing (OT): Needs assistance Is this a change from baseline?: Change from baseline, expected to last >3 days Grooming: Needs assistance Is this a change from baseline?: Change from baseline, expected to last >3 days Feeding: Needs assistance Is this a change from baseline?: Change from baseline, expected to last >3 days Bathing: Needs assistance Is this a change from baseline?: Change from baseline, expected to last >3 days Toileting: Needs assistance Is this a change from baseline?: Change from baseline, expected to last >3days In/Out Bed: Needs assistance Is this a change from baseline?: Change from baseline, expected to last >3 days Walks in Home: Needs assistance Is this a change from baseline?: Change from baseline, expected to last >3 days Does the patient have difficulty walking or climbing stairs?: Yes (secondary to weakness) Weakness of Legs: Both Weakness of Arms/Hands: None  Permission Sought/Granted Permission sought to share information with :  Family Supports Permission granted to share information with : Yes, Verbal Permission Granted  Share Information with NAME: [name], [name] (Daughter) [phone] (           Emotional Assessment Appearance:: Appears stated age Attitude/Demeanor/Rapport: Engaged Affect (typically observed): Appropriate   Alcohol / Substance Use: Not Applicable Psych  Involvement: No (comment)  Admission diagnosis:  Weakness [R53.1] Stroke [facility]) [I63.9] Metastatic malignant neoplasm, unspecified site ([place]) [C79.9] COVID-19 [U07.1] Patient Active Problem List   Diagnosis Date Noted  . Stroke ([place]) 03/10/2020  . COVID-19 03/10/2020  . Sepsis ([place]) 11/10/2019  . Changing skin lesion 11/07/2018  . History of right breast cancer 06/05/2018  . Acquired absence of breast and nipple, bilateral 06/05/2018  . Cellulitis 06/09/2017  . Acute lower UTI 02/20/2017  . Elevated lactic acid level   . Urinary tract infection without hematuria   . Weakness   . Generalized weakness 02/19/2017  . AKI (acute kidney injury) ([place]) 02/12/2017  . Dehydration 02/12/2017  . Abnormal TSH 02/12/2017  . Genetic testing 11/27/2016  . Port catheter in place 11/14/2016  . Bone metastases ([place]) 11/11/2016  . Morbid obesity with BMI of 50.0-59.9, adult ([place]) 11/05/2016  . Transaminitis 11/05/2016  . Malignant neoplasm of overlapping sites of right breast in female, estrogen receptor positive ([place]) 10/21/2016  . Encounter for follow-up surveillance of colon cancer   . Benign neoplasm of ascending colon   . Benign neoplasm of descending colon   . Benign neoplasm of transverse colon   . Benign neoplasm of cecum   . OSA (obstructive sleep apnea) 09/14/2012  . Contact dermatitis and eczema due to plant 12/31/2011  . Hypothyroidism 02/01/2010  . Essential hypertension 01/05/2009  . History of colonic polyps 01/05/2009   PCP:  [name], [name], MD Pharmacy:   [id number] [place], [address] [address] Phone: [phone] Fax: [phone]     Social Determinants of Health (SDOH) Interventions    Readmission Risk Interventions No flowsheet data found.

## 2020-03-16 NOTE — Progress Notes (Signed)
PROGRESS NOTE  PRINCES FINGER  OVZ:[id number] DOB: 06/01/1945 DOA: 03/09/2020 PCP: [name], [name], MD  Outpatient Specialists: Oncology, Dr. [name] Brief Narrative: Jasmine Hood is a 74 y.o. 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 She had been exposed to her daughter who had covid. CTA chest showed no PE, but did have some opacities (infiltrate vs. chronic disease and post-radiation) and SARS-CoV-2 PCR was positive. She also had head imaging concerning for metastasis. MRI brain revealed acute ischemic L cerebellar infarct and concern for tiny subacute nonhemorrhagic infarcts in the R centrum ovale. There was concern for possible hemorrhagic right frontal metastasis for which neurology was consulted. She developed a fever on 10/24 and 10/25. Blood cultures drawn 10/25 grew Strep mitis/orabilis in 2 of 2 bottles. ID consulted and the patient continues on ceftriaxone.   Assessment & Plan: Principal Problem:   Weakness Active Problems:   Hypothyroidism   Essential hypertension   OSA (obstructive sleep apnea)   Malignant neoplasm of overlapping sites of right breast in female, estrogen receptor positive ([place])   Bone metastases ([place])   Stroke ([place])   COVID-19  Covid-19 infection: +SARS-CoV-2 PCR on admission screening 10/21, s/p J&J vaccine, now s/p monoclonal antibody (re[place]on) 10/22. Remains on room air - Remain on isolation for 10 days from positive testing.  Sepsis due to Streptococcal bacteremia: +Cx 10/25, continues to have GPC in Cx 10/26. Recurrently febrile 10/27. - D/w ID, Dr. Baxter Flattery. TTE 10/22 (prior to fevers) showed no vegetations. Pt declines TEE. Will plan to treat for 4 weeks with IV abx. Await proof of clearance prior to inserting PICC. Repeat blood cultures ordered today. - Continue ceftriaxone, may convert to PCN based on  susceptibilities (pending).   Left cerebellar infarct, multiple other infarcts, hemorrhagic right frontal focus: Now concern for septic emboli with recurrently positive blood cultures with multiple territories affected.  - Neurology, Dr. Leonel Ramsay, recommends outpatient neurology follow up, outpatient cardiac monitoring (due to possibility of cardioembolic/nonspetic emboli) and baby aspirin.    Hypothyroidism: Pt not taking low dose synthroid. TSH 5.611 on 10/22.  - Recommend PCP, continue synthroid 24mcg here.  Metastatic breast CA: Sclerotic calvarial lesions noted on CT.  - Dr. [name] aware of admission.  - Continue anastrozole  - Palliative care consulted.  HTN:  - No changes currently  OSA:  - CPAP qHS (declined by patient)  Thrombocytopenia: Stable.  Hyponatremia: s/p IVF.   Pyuria: Nonclonal urine culture.   Obesity: Estimated body mass index is 45.91 kg/m as calculated from the following:   Height as of this encounter: 4\' 11"  (1.499 m).   Weight as of this encounter: 103.1 kg.  DVT prophylaxis: SCDs  Code Status: DNR Family Communication: None at bedside, declined offer to call.  Disposition Plan:  Status is: Inpatient  Remains inpatient appropriate because:Ongoing diagnostic testing needed not appropriate for outpatient work up and Inpatient level of care appropriate due to severity of illness  Dispo: The patient is from: Home              Anticipated d/c is to: Home              Anticipated d/c date is: 2 days              Patient currently is not medically stable to d/c.  Consultants:   ID  Neurology  Procedures:   Echocardiogram  Antimicrobials:  Ceftriaxone 10/26 >>   Subjective: No new complaints. Eager to get home, denies fever/chills, cough, dyspnea, numbness or weakness.   Objective: Vitals:   03/15/20 1618 03/15/20 2100 03/16/20 0503 03/16/20 1342  BP: (!) 153/65 (!) 156/62 (!) 153/59 (!) 154/65  Pulse: 92 90 92 88  Resp: 18 20  (!) 25 20  Temp: 98.8 F (37.1 C) 98.8 F (37.1 C) 98.8 F (37.1 C) 98.1 F (36.7 C)  TempSrc: Oral   Oral  SpO2: 94% 94% 91% 92%  Weight:   103.1 kg   Height:        Intake/Output Summary (Last 24 hours) at 03/16/2020 1617 Last data filed at 03/16/2020 1321 Gross per 24 hour  Intake 922.72 ml  Output --  Net 922.72 ml   Filed Weights   03/13/20 0500 03/14/20 0500 03/16/20 0503  Weight: 107.8 kg 101.2 kg 103.1 kg   Gen: 74 y.o. female in no distress Pulm: Nonlabored breathing. Clear. CV: Regular rate and rhythm. No murmur, rub, or gallop. No JVD, no pitting dependent edema. GI: Abdomen soft, non-tender, non-distended, with normoactive bowel sounds.  Ext: Warm, no deformities Skin: No rashes, lesions or ulcers on visualized skin. Neuro: Alert and oriented. Follows commands, moves extremities. Psych: Judgement and insight appear fair. Mood euthymic & affect congruent. Behavior is appropriate.    Data Reviewed: I have personally reviewed following labs and imaging studies  CBC: Recent Labs  Lab 03/10/20 0604 03/11/20 0408 03/13/20 0615 03/14/20 0333 03/15/20 0355  WBC 6.8 4.0 4.1 4.1 5.0  NEUTROABS  --  2.9 3.0 2.8 4.1  HGB 11.0* 10.4* 10.5* 10.2* 11.2*  HCT 33.4* 31.6* 32.0* 31.2* 34.2*  MCV 105.7* 106.0* 108.1* 107.2* 106.5*  PLT 124* 113* 97* 100* 99*   Basic Metabolic Panel: Recent Labs  Lab 03/10/20 0604 03/11/20 0408 03/13/20 0615 03/14/20 0333 03/15/20 0355  NA  --  136 135 136 133*  K  --  4.1 4.5 3.7 4.1  CL  --  103 102 100 101  CO2  --  24 25 27 23   GLUCOSE  --  138* 128* 125* 153*  BUN  --  10 10 10 10   CREATININE 0.63 0.51 0.56 0.52 0.54  CALCIUM  --  7.5* 7.7* 7.8* 7.8*  MG  --  2.0 1.8 1.8 1.8  PHOS  --  2.5 3.8 3.7 3.0   GFR: Estimated Creatinine Clearance: 63.5 mL/min (by C-G formula based on SCr of 0.54 mg/dL). Liver Function Tests: Recent Labs  Lab 03/11/20 0408 03/13/20 0615 03/14/20 0333 03/15/20 0355  AST 84* 81* 77* 86*   ALT 31 38 40 41  ALKPHOS 106 114 127* 139*  BILITOT 0.7 1.0 0.8 0.9  PROT 5.7* 5.7* 5.9* 6.4*  ALBUMIN 2.1* 2.0* 2.2* 2.3*   Urine analysis:    Component Value Date/Time   COLORURINE YELLOW 03/13/2020 2000   APPEARANCEUR HAZY (A) 03/13/2020 2000   LABSPEC 1.008 03/13/2020 2000   PHURINE 7.0 03/13/2020 2000   GLUCOSEU NEGATIVE 03/13/2020 2000   GLUCOSEU NEGATIVE 01/25/2010 0858   HGBUR MODERATE (A) 03/13/2020 2000   HGBUR 1+ 12/30/2008 0838   BILIRUBINUR NEGATIVE 03/13/2020 2000   BILIRUBINUR Neg 04/28/2015 1122   KETONESUR NEGATIVE 03/13/2020 2000   PROTEINUR NEGATIVE 03/13/2020 2000   UROBILINOGEN 0.2 04/28/2015 1122   UROBILINOGEN 0.2 01/25/2010 0858   NITRITE NEGATIVE 03/13/2020 2000   LEUKOCYTESUR TRACE (A) 03/13/2020 2000   Recent Results (from the past  240 hour(s))  Respiratory Panel by RT PCR (Flu A&B, Covid) -     Status: Abnormal   Collection Time: 03/09/20  3:41 PM   Specimen: Nasopharyngeal  Result Value Ref Range Status   SARS Coronavirus 2 by RT PCR POSITIVE (A) NEGATIVE Final    Comment: RESULT CALLED TO, READ BACK BY AND VERIFIED WITH: R.GROVES AT [place] ON 03/09/20 BY N.THOMPSON (NOTE) SARS-CoV-2 target nucleic acids are DETECTED.  SARS-CoV-2 RNA is generally detectable in upper respiratory specimens  during the acute phase of infection. Positive results are indicative of the presence of the identified virus, but do not rule out bacterial infection or co-infection with other pathogens not detected by the test. Clinical correlation with patient history and other diagnostic information is necessary to determine patient infection status. The expected result is Negative.  Fact Sheet for Patients:  PinkCheek.be  Fact Sheet for Healthcare Providers: GravelBags.it  This test is not yet approved or cleared by the [place] FDA and  has been authorized for detection and/or diagnosis of SARS-CoV-2  by FDA under an Emergency Use Authorization (EUA).  This EUA will remain in effect (meaning this test  can be used) for the duration of  the COVID-19 declaration under Section 564(b)(1) of the Act, 21 U.S.C. section 360bbb-3(b)(1), unless the authorization is terminated or revoked sooner.      Influenza A by PCR NEGATIVE NEGATIVE Final   Influenza B by PCR NEGATIVE NEGATIVE Final    Comment: (NOTE) The Xpert Xpress SARS-CoV-2/FLU/RSV assay is intended as an aid in  the diagnosis of influenza from Nasopharyngeal swab specimens and  should not be used as a sole basis for treatment. Nasal washings and  aspirates are unacceptable for Xpert Xpress SARS-CoV-2/FLU/RSV  testing.  Fact Sheet for Patients: PinkCheek.be  Fact Sheet for Healthcare Providers: GravelBags.it  This test is not yet approved or cleared by the [place] FDA and  has been authorized for detection and/or diagnosis of SARS-CoV-2 by  FDA under an Emergency Use Authorization (EUA). This EUA will remain  in effect (meaning this test can be used) for the duration of the  Covid-19 declaration under Section 564(b)(1) of the Act, 21  U.S.C. section 360bbb-3(b)(1), unless the authorization is  terminated or revoked. Performed at [facility], [place] [address], [place], [place] [address]   Culture, Urine     Status: Abnormal   Collection Time: 03/09/20  3:41 PM   Specimen: Urine, Clean Catch  Result Value Ref Range Status   Specimen Description   Final    URINE, CLEAN CATCH Performed at [facility], [place] [address], [place], [place] [address]    Special Requests   Final    NONE Performed at [facility], [place] [address], [place], [place] [address]    Culture MULTIPLE SPECIES PRESENT, SUGGEST RECOLLECTION (A)  Final   Report Status 03/11/2020 FINAL  Final  Culture, blood (routine x 2)     Status:  Abnormal   Collection Time: 03/13/20  2:53 PM   Specimen: BLOOD LEFT FOREARM  Result Value Ref Range Status   Specimen Description BLOOD LEFT FOREARM  Final   Special Requests   Final    BOTTLES DRAWN AEROBIC AND ANAEROBIC Blood Culture adequate volume   Culture  Setup Time   Final    GRAM POSITIVE COCCI IN CHAINS IN BOTH AEROBIC AND ANAEROBIC BOTTLES CRITICAL RESULT CALLED TO, READ BACK BY AND VERIFIED WITH: E. WILLIAMSON,PHARMD 0600 03/14/2020 T.  TYSOR    Culture STREPTOCOCCUS MITIS/ORALIS (A)  Final   Report Status 03/16/2020 FINAL  Final   Organism ID, Bacteria STREPTOCOCCUS MITIS/ORALIS  Final      Susceptibility   Streptococcus mitis/oralis - MIC*    TETRACYCLINE >=16 RESISTANT Resistant     VANCOMYCIN 0.5 SENSITIVE Sensitive     CLINDAMYCIN >=1 RESISTANT Resistant     PENICILLIN Value in next row Sensitive      SENSITIVE0.06    CEFTRIAXONE Value in next row Sensitive      SENSITIVE0.12Performed at [facility], [place] [address], [place], [place] [address]    * STREPTOCOCCUS MITIS/ORALIS  Blood Culture ID Panel (Reflexed)     Status: Abnormal   Collection Time: 03/13/20  2:53 PM  Result Value Ref Range Status   Enterococcus faecalis NOT DETECTED NOT DETECTED Final   Enterococcus Faecium NOT DETECTED NOT DETECTED Final   Listeria monocytogenes NOT DETECTED NOT DETECTED Final   Staphylococcus species NOT DETECTED NOT DETECTED Final   Staphylococcus aureus (BCID) NOT DETECTED NOT DETECTED Final   Staphylococcus epidermidis NOT DETECTED NOT DETECTED Final   Staphylococcus lugdunensis NOT DETECTED NOT DETECTED Final   Streptococcus species DETECTED (A) NOT DETECTED Final    Comment: Not Enterococcus species, Streptococcus agalactiae, Streptococcus pyogenes, or Streptococcus pneumoniae. CRITICAL RESULT CALLED TO, READ BACK BY AND VERIFIED WITH: E. WILLIAMSON,PHARMD 0600 03/14/2020 T. TYSOR    Streptococcus agalactiae NOT DETECTED NOT DETECTED Final   Streptococcus  pneumoniae NOT DETECTED NOT DETECTED Final   Streptococcus pyogenes NOT DETECTED NOT DETECTED Final   A.calcoaceticus-baumannii NOT DETECTED NOT DETECTED Final   Bacteroides fragilis NOT DETECTED NOT DETECTED Final   Enterobacterales NOT DETECTED NOT DETECTED Final   Enterobacter cloacae complex NOT DETECTED NOT DETECTED Final   Escherichia coli NOT DETECTED NOT DETECTED Final   Klebsiella aerogenes NOT DETECTED NOT DETECTED Final   Klebsiella oxytoca NOT DETECTED NOT DETECTED Final   Klebsiella pneumoniae NOT DETECTED NOT DETECTED Final   Proteus species NOT DETECTED NOT DETECTED Final   Salmonella species NOT DETECTED NOT DETECTED Final   Serratia marcescens NOT DETECTED NOT DETECTED Final   Haemophilus influenzae NOT DETECTED NOT DETECTED Final   Neisseria meningitidis NOT DETECTED NOT DETECTED Final   Pseudomonas aeruginosa NOT DETECTED NOT DETECTED Final   Stenotrophomonas maltophilia NOT DETECTED NOT DETECTED Final   Candida albicans NOT DETECTED NOT DETECTED Final   Candida auris NOT DETECTED NOT DETECTED Final   Candida glabrata NOT DETECTED NOT DETECTED Final   Candida krusei NOT DETECTED NOT DETECTED Final   Candida parapsilosis NOT DETECTED NOT DETECTED Final   Candida tropicalis NOT DETECTED NOT DETECTED Final   Cryptococcus neoformans/gattii NOT DETECTED NOT DETECTED Final    Comment: Performed at [facility], [address], [place], [place] [address]  Culture, Urine     Status: Abnormal   Collection Time: 03/13/20  8:00 PM   Specimen: Urine, Random  Result Value Ref Range Status   Specimen Description   Final    URINE, RANDOM Performed at [place] [address], [place], [place] [address]    Special Requests   Final    NONE Performed at [facility], [place] [address], [place], [place] [address]    Culture MULTIPLE SPECIES PRESENT, SUGGEST RECOLLECTION (A)  Final   Report Status 03/15/2020 FINAL  Final  Culture,  blood (routine x 2)     Status: Abnormal   Collection Time: 03/14/20  4:28 AM   Specimen: BLOOD LEFT HAND  Result Value Ref Range Status   Specimen Description BLOOD LEFT HAND  Final   Special Requests   Final    BOTTLES DRAWN AEROBIC ONLY Blood Culture results may not be optimal due to an inadequate volume of blood received in culture bottles   Culture  Setup Time   Final    AEROBIC BOTTLE ONLY GRAM POSITIVE COCCI IN CHAINS CRITICAL VALUE NOTED.  VALUE IS CONSISTENT WITH PREVIOUSLY REPORTED AND CALLED VALUE.    Culture (A)  Final    STREPTOCOCCUS MITIS/ORALIS SUSCEPTIBILITIES PERFORMED ON PREVIOUS CULTURE WITHIN THE LAST 5 DAYS. Performed at [facility], [place] [address], [place], [place] [address]    Report Status 03/16/2020 FINAL  Final  Culture, blood (routine x 2)     Status: Abnormal   Collection Time: 03/14/20  5:57 AM   Specimen: BLOOD  Result Value Ref Range Status   Specimen Description   Final    BLOOD LEFT ARM Performed at [place] [address] [place], [place] [address]    Special Requests   Final    BOTTLES DRAWN AEROBIC ONLY Performed at [place] [address], [place], [place] [address]    Culture  Setup Time   Final    AEROBIC BOTTLE ONLY GRAM POSITIVE COCCI CRITICAL VALUE NOTED.  VALUE IS CONSISTENT WITH PREVIOUSLY REPORTED AND CALLED VALUE.    Culture (A)  Final    STREPTOCOCCUS MITIS/ORALIS SUSCEPTIBILITIES PERFORMED ON PREVIOUS CULTURE WITHIN THE LAST 5 DAYS. Performed at [facility], [place] [address], [place], [place] [address]    Report Status 03/16/2020 FINAL  Final      Radiology Studies: MR BRAIN W WO CONTRAST  Result Date: 03/15/2020 CLINICAL DATA:  Possible hemorrhagic metastasis. EXAM: MRI HEAD WITHOUT AND WITH CONTRAST TECHNIQUE: Multiplanar, multiecho pulse sequences of the brain and surrounding structures were obtained without and with intravenous contrast. CONTRAST:  35mL  GADAVIST GADOBUTROL 1 MMOL/ML IV SOLN COMPARISON:  Head CT 03/10/2020 FINDINGS: Brain: Unchanged appearance of small left cerebellar infarct. There are new foci of abnormal diffusion restriction within the right corona radiata and frontal lobe. Multifocal hyperintense T2-weighted signal within the white matter. Normal volume of CSF spaces. Punctate focus hemorrhage in the right temporal lobe is unchanged. There are no contrast-enhancing lesions. Normal midline structures. Vascular: Normal flow voids. Skull and upper cervical spine: Multiple hypointense T1-weighted signal lesions of the calvarium are unchanged. Sinuses/Orbits: Negative. Other: None. IMPRESSION: 1. Multiple new foci of abnormal diffusion restriction within the right corona radiata and frontal lobe, consistent with acute to subacute infarcts. No hemorrhage or mass effect. 2. 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 3. Unchanged punctate focus of hemorrhage in the right temporal lobe. No abnormal contrast enhancement. Electronically Signed   By: Ulyses Jarred M.D.   On: 03/15/2020 00:16    Scheduled Meds: .  stroke: mapping our early stages of recovery book   Does not apply Once  . anastrozole  1 mg Oral Daily  . aspirin  81 mg Oral Daily  . levothyroxine  25 mcg Oral Q0600  . pantoprazole  40 mg Oral Daily  . polyethylene glycol  17 g Oral BID  . Ensure Max Protein  11 oz Oral BID   Continuous Infusions: . penicillin g continuous IV infusion       LOS: 6 days   Time spent: 25 minutes.  Patrecia Pour, MD  [facility] [email or web address] 03/16/2020, 4:17 PM

## 2020-03-16 NOTE — Progress Notes (Signed)
PHARMACY CONSULT NOTE FOR:  OUTPATIENT  PARENTERAL ANTIBIOTIC THERAPY (OPAT)  Indication: Possible endocarditis  Regimen: Penicillin G 24 million units daily as a continuous infusion End date: 04/11/2020  IV antibiotic discharge orders are pended. To discharging provider:  please sign these orders via discharge navigator,  Select New Orders & click on the button choice - Manage This Unsigned Work.     Thank you for allowing pharmacy to be a part of this patient's care.  Phillis Haggis 03/16/2020, 3:48 PM

## 2020-03-16 NOTE — Progress Notes (Signed)
ID PROGRESS NOTE  Based on her sensitivies of strep mitis/oralis, we will change her abtx to penicillin 24MU continous infusion.  Elzie Rings [facility] [phone]

## 2020-03-17 ENCOUNTER — Inpatient Hospital Stay (HOSPITAL_COMMUNITY): Payer: Medicare Other

## 2020-03-17 ENCOUNTER — Inpatient Hospital Stay: Payer: Self-pay

## 2020-03-17 DIAGNOSIS — C7951 Secondary malignant neoplasm of bone: Secondary | ICD-10-CM | POA: Diagnosis not present

## 2020-03-17 DIAGNOSIS — Z7189 Other specified counseling: Secondary | ICD-10-CM

## 2020-03-17 DIAGNOSIS — I10 Essential (primary) hypertension: Secondary | ICD-10-CM | POA: Diagnosis not present

## 2020-03-17 DIAGNOSIS — U071 COVID-19: Principal | ICD-10-CM

## 2020-03-17 DIAGNOSIS — E031 Congenital hypothyroidism without goiter: Secondary | ICD-10-CM

## 2020-03-17 HISTORY — PX: IR FLUORO GUIDE CV LINE RIGHT: IMG2283

## 2020-03-17 HISTORY — PX: IR US GUIDE VASC ACCESS RIGHT: IMG2390

## 2020-03-17 LAB — COMPREHENSIVE METABOLIC PANEL
ALT: 37 U/L (ref 0–44)
AST: 75 U/L — ABNORMAL HIGH (ref 15–41)
Albumin: 2.1 g/dL — ABNORMAL LOW (ref 3.5–5.0)
Alkaline Phosphatase: 136 U/L — ABNORMAL HIGH (ref 38–126)
Anion gap: 8 (ref 5–15)
BUN: 12 mg/dL (ref 8–23)
CO2: 24 mmol/L (ref 22–32)
Calcium: 7.6 mg/dL — ABNORMAL LOW (ref 8.9–10.3)
Chloride: 102 mmol/L (ref 98–111)
Creatinine, Ser: 0.51 mg/dL (ref 0.44–1.00)
GFR, Estimated: >60 mL/min (ref >60)
Glucose, Bld: 130 mg/dL — ABNORMAL HIGH (ref 70–99)
Potassium: 4.1 mmol/L (ref 3.5–5.1)
Sodium: 134 mmol/L — ABNORMAL LOW (ref 135–145)
Total Bilirubin: 0.9 mg/dL (ref 0.3–1.2)
Total Protein: 6.1 g/dL — ABNORMAL LOW (ref 6.5–8.1)

## 2020-03-17 LAB — CBC
HCT: 30.9 % — ABNORMAL LOW (ref 36.0–46.0)
Hemoglobin: 10.2 g/dL — ABNORMAL LOW (ref 12.0–15.0)
MCH: 35.1 pg — ABNORMAL HIGH (ref 26.0–34.0)
MCHC: 33 g/dL (ref 30.0–36.0)
MCV: 106.2 fL — ABNORMAL HIGH (ref 80.0–100.0)
Platelets: 112 10*3/uL — ABNORMAL LOW (ref 150–400)
RBC: 2.91 MIL/uL — ABNORMAL LOW (ref 3.87–5.11)
RDW: 15.5 % (ref 11.5–15.5)
WBC: 4.5 10*3/uL (ref 4.0–10.5)
nRBC: 0 % (ref 0.0–0.2)

## 2020-03-17 MED ORDER — PENICILLIN G POTASSIUM IV (FOR PTA / DISCHARGE USE ONLY): 24.0000 10*6.[IU] | Freq: Every day | INTRAVENOUS | 0 refills | Status: AC

## 2020-03-17 MED ORDER — LIDOCAINE HCL 1 % IJ SOLN
INTRAMUSCULAR | Status: DC | PRN
  Administered 2020-03-17: 10 mL via INTRADERMAL

## 2020-03-17 MED ORDER — LIDOCAINE HCL 1 % IJ SOLN
INTRAMUSCULAR | Status: AC
  Filled 2020-03-17: qty 20

## 2020-03-17 NOTE — Progress Notes (Signed)
Pt Physical Therapy Treatment Patient Details Name: Jasmine Hood MRN: [id number] DOB: Mar 29, 1946 Today's Date: 03/17/2020    History of Present Illness Patient is a 74 year old female medical history significant of metastatic breast cancer, with metastasis to the thoracic spine. Patient was brought to the hospital due to generalized weakness. Patient tested positive for COVID in ED. Pt MRI reads: 9 mm acute ischemic nonhemorrhagic left cerebellar infarct, 7 mm focus of susceptibility artifact involving the right temporal lobe concerning for possible hemorrhagic metastasis.     PT Comments    Pt in bed on RA sats 94% at rest.  Assisted OOB to amb to bathroom.  General Gait Details: amb to and from bathroom.  Tolerated the short distance well well with mild dyspnea.  c/o fatigue after. Lowest RA was briefly 88% and dyspnea 3/4.   Follow Up Recommendations  Home health PT;Supervision/Assistance - 24 hour;SNF     Equipment Recommendations  None recommended by PT    Recommendations for Other Services       Precautions / Restrictions Precautions Precautions: Fall Precaution Comments: monitor sats Restrictions Weight Bearing Restrictions: No    Mobility  Bed Mobility Overal bed mobility: Needs Assistance Bed Mobility: Supine to Sit;Sit to Supine     Supine to sit: Min assist Sit to supine: Mod assist;Max assist   General bed mobility comments: mod A to elevate trunk to sitting. mod A to lift LEs onto bed  Transfers Overall transfer level: Needs assistance Equipment used: Rolling walker (2 wheeled) Transfers: Sit to/from Stand Sit to Stand: Min guard         General transfer comment: 25% VC's on proper hand placement and safety with turns  Ambulation/Gait Ambulation/Gait assistance: Supervision;Min guard Gait Distance (Feet): 22 Feet Assistive device: Rolling walker (2 wheeled) Gait Pattern/deviations: Step-through pattern;Decreased stride length;Trunk flexed;Wide  base of support Gait velocity: decr   General Gait Details: amb to and from bathroom.  Tolerated the short distance well well with mild dyspnea.  c/o fatigue after.   Stairs             Wheelchair Mobility    Modified Rankin (Stroke Patients Only)       Balance                                            Cognition Arousal/Alertness: Awake/alert Behavior During Therapy: WFL for tasks assessed/performed Overall Cognitive Status: Within Functional Limits for tasks assessed                                        Exercises      General Comments        Pertinent Vitals/Pain Pain Assessment: No/denies pain Faces Pain Scale: Hurts a little bit Pain Location: back Pain Descriptors / Indicators: Sore;Discomfort;Grimacing Pain Intervention(s): Monitored during session;Repositioned    Home Living                      Prior Function            PT Goals (current goals can now be found in the care plan section) Progress towards PT goals: Progressing toward goals    Frequency    Min 3X/week      PT Plan Current plan remains appropriate;Discharge  plan needs to be updated    Co-evaluation              AM-PAC PT "6 Clicks" Mobility   Outcome Measure  Help needed turning from your back to your side while in a flat bed without using bedrails?: A Lot Help needed moving from lying on your back to sitting on the side of a flat bed without using bedrails?: A Lot Help needed moving to and from a bed to a chair (including a wheelchair)?: A Little Help needed standing up from a chair using your arms (e.g., wheelchair or bedside chair)?: A Little Help needed to walk in hospital room?: A Little Help needed climbing 3-5 steps with a railing? : A Lot 6 Click Score: 15    End of Session Equipment Utilized During Treatment: Gait belt Activity Tolerance: Patient tolerated treatment well Patient left: in bed;with call  bell/phone within reach;with bed alarm set Nurse Communication: Mobility status PT Visit Diagnosis: Muscle weakness (generalized) (M62.81);Difficulty in walking, not elsewhere classified (R26.2);Unsteadiness on feet (R26.81)     Time: 1430-1500 PT Time Calculation (min) (ACUTE ONLY): 30 min  Charges:  $Gait Training: 8-22 mins $Therapeutic Activity: 8-22 mins                     Rica Koyanagi  PTA Acute  Rehabilitation Services Pager      [phone] Office      [phone]

## 2020-03-17 NOTE — Procedures (Signed)
Interventional Radiology Procedure Note  Procedure: Right IJ tunneled CVC placement  Complications: None  Estimated Blood Loss: < 10 mL  Findings: 23 cm, 6 Fr DL tunneled Power Line placed via right IJ vein with catheter tip at SVC/RA junction. OK to use.  Venetia Night. Kathlene Cote, M.D Pager:  [phone]

## 2020-03-17 NOTE — Care Management Important Message (Signed)
Important Message  Patient Details IM Letter placed in door caddy to be given to the Patient Name: Jasmine Hood MRN: [id number] Date of Birth: [birth year]   Medicare Important Message Given:  Yes     Kerin Salen 03/17/2020, 12:05 PM

## 2020-03-17 NOTE — Progress Notes (Signed)
Nutrition Follow-up  DOCUMENTATION CODES:   Morbid obesity  INTERVENTION:   -Ensure MAX Protein po BID, each supplement provides 150 kcal and 30 grams of protein  NUTRITION DIAGNOSIS:   Increased nutrient needs related to acute illness (COVID-19 infection) as evidenced by estimated needs.  Ongoing.  GOAL:   Patient will meet greater than or equal to 90% of their needs  Progressing.  MONITOR:   PO intake, Supplement acceptance, Labs, Weight trends, I & O's  ASSESSMENT:   74 y.o. female with medical history significant of metastatic breast cancer, with metastasis to the thoracic spine. Patient was brought to the hospital due to generalized weakness. Admitted for COVID-19 infection.  COVID-19+ since 10/21.  Pt currently consuming 0-25% of meals. Will continue Ensure Max supplements.  Pt having pain from cancer mets.   Admission weight: 226 lbs.  Current weight: 225 lbs  Medications reviewed. Labs reviewed: Low Na  Diet Order:   Diet Order            Diet regular Room service appropriate? Yes; Fluid consistency: Thin  Diet effective now                 EDUCATION NEEDS:   No education needs have been identified at this time  Skin:  Skin Assessment: Reviewed RN Assessment  Last BM:  10/24 -type 2  Height:   Ht Readings from Last 1 Encounters:  03/09/20 4\' 11"  (1.499 m)    Weight:   Wt Readings from Last 1 Encounters:  03/17/20 102.5 kg   BMI:  Body mass index is 45.64 kg/m.  Estimated Nutritional Needs:   Kcal:  1600-1800  Protein:  90-100g  Fluid:  1.8L/day  Clayton Bibles, MS, RD, LDN Inpatient Clinical Dietitian Contact information available via Amion

## 2020-03-17 NOTE — Progress Notes (Signed)
PROGRESS NOTE    Jasmine Hood  GYI:[id number] DOB: [birth year] DOA: 03/09/2020 PCP: Jasmine Hood    Brief Narrative:  Jasmine Hood admitted to the hospital for streptococcal bacteremia in the setting of a positive SARS COVID-19 infection.  74 year old female with a past medical history for metastatic breast cancer to the thoracic spine, who presents with progressive generalized weakness to the point where she is not able to ambulate. She reports poor oral intake, generalized malaise and fatigue. She collapsed to the floor about 2 days ago, unable to stand back on her feet. On her initial physical examination blood pressure 123/68, heart rate 77, respiratory rate 27, oxygen saturation 91%, mild pallor, dry mucous membranes, lungs with scattered rhonchi no rales or wheezing, abdomen protuberant, soft, trace nonpitting lower extremity edema. Sodium 133, potassium 4.1, chloride 101, bicarb 23, glucose 150, BUN 10, creatinine 0.60, calcium 7.5, albumin 2.5, AST 96, ALT 33, white count 9.5, hemoglobin 11.9, hematocrit 35.1, platelets 124. SARS COVID-19 pending. Urinalysis specific gravity 1.020, 0-5 red cells, 11-20 white cells. Head CT with a 5 mm nodular hyperdensity within the posterior right temporal lobe. May reflect metastasis, small acute parenchymal hemorrhage cannot be excluded.  Multiple known calvarial metastasis.  Patient tested positive for COVID-19 on admission.  Further work-up with brain MRI revealed acute ischemic left cerebellar infarct and concern for tiny subacute nonhemorrhagic infarct in the right centrum ovale.  1024/1025 patient was febrile, blood cultures positive for Streptococcus midis/orabilis, she was placed on antibiotic therapy with ceftriaxone.  Transthoracic echocardiography no vegetations.  She declined transesophageal echocardiography.  Her brain lesions were determined to be infarct, possible septic emboli.  Neurology recommended aspirin and  follow-up with neurology as an outpatient.   Assessment & Plan:   Principal Problem:   Weakness Active Problems:   Hypothyroidism   Essential hypertension   OSA (obstructive sleep apnea)   Malignant neoplasm of overlapping sites of right breast in female, estrogen receptor positive ([place])   Bone metastases ([place])   Stroke ([place])   COVID-19   Palliative care by specialist   Advanced care planning/counseling discussion   Goals of care, counseling/discussion   1. Sepsis due to streptococcal bacteremia. Not clear source, she has declined TEE. Patient will need long term antibiotic therapy with penicillin per ID recommendations.  Follow up blood cultures continue with no growth.   Patient had bilateral mastectomy, will call IR for recommendations for central line.   2. Left cerebellar infarct/ hemorrhagic right frontal. Likely septic emboli, will continue neuro checks per unit protocol.  Continue aspirin and follow up with neurology as outpatient.   3. Hypothyroid,. Continue with levothyroxine  4. Metastatic breast cancer/ thrombocytopenia. Continue anastrazole and follow up as outpatient with Jasmine Hood.   5. HTN Continue blood pressure monitoring.   6. Obesity/ OSA. Class 3. BMI is 45,9. Continue with Cpap.   7. Hyponatremia. Clinically resolved, patient is tolerating po well.   6. Positive covid 19 infection. Sp monoclonal antibody infusion.    Status is: Inpatient  Remains inpatient appropriate because:IV treatments appropriate due to intensity of illness or inability to take PO   Dispo: The patient is from: Home              Anticipated d/c is to: Home              Anticipated d/c date is: 2 days              Patient currently is not  medically stable to d/c.    DVT prophylaxis: Enoxaparin   Code Status:   DNR  Family Communication:       Nutrition Status: Nutrition Problem: Increased nutrient needs Etiology: acute illness (COVID-19  infection) Signs/Symptoms: estimated needs Interventions: Refer to RD note for recommendations      Consultants:   ID      Antimicrobials:   Penicillin     Subjective: Patient with no nausea or vomiting, continue to be very weak and deconditioned, improved but not yet back to baseline, no nausea or vomiting,.   Objective: Vitals:   03/16/20 1342 03/16/20 2133 03/17/20 0441 03/17/20 0500  BP: (!) 154/65 (!) 141/54 (!) 148/53   Pulse: 88 88 84   Resp: 20 20 18    Temp: 98.1 F (36.7 C) 97.9 F (36.6 C) 97.9 F (36.6 C)   TempSrc: Oral Oral Oral   SpO2: 92% 93% 92%   Weight:    102.5 kg  Height:        Intake/Output Summary (Last 24 hours) at 03/17/2020 1235 Last data filed at 03/17/2020 0500 Gross per 24 hour  Intake 945.72 ml  Output 1050 ml  Net -104.28 ml   Filed Weights   03/14/20 0500 03/16/20 0503 03/17/20 0500  Weight: 101.2 kg 103.1 kg 102.5 kg    Examination:   General: Not in pain or dyspnea, deconditioned  Neurology: Awake and alert, non focal  E ENT: mild pallor, no icterus, oral mucosa moist Cardiovascular: No JVD. S1-S2 present, rhythmic, no gallops, rubs, or murmurs. No lower extremity edema. Pulmonary: positive breath sounds bilaterally, with no wheezing, rhonchi or rales. Gastrointestinal. Abdomen soft and non tender Skin. No rashes Musculoskeletal: no joint deformities     Data Reviewed: I have personally reviewed following labs and imaging studies  CBC: Recent Labs  Lab 03/11/20 0408 03/13/20 0615 03/14/20 0333 03/15/20 0355 03/17/20 0439  WBC 4.0 4.1 4.1 5.0 4.5  NEUTROABS 2.9 3.0 2.8 4.1  --   HGB 10.4* 10.5* 10.2* 11.2* 10.2*  HCT 31.6* 32.0* 31.2* 34.2* 30.9*  MCV 106.0* 108.1* 107.2* 106.5* 106.2*  PLT 113* 97* 100* 99* 536*   Basic Metabolic Panel: Recent Labs  Lab 03/11/20 0408 03/13/20 0615 03/14/20 0333 03/15/20 0355 03/17/20 0319  NA 136 135 136 133* 134*  K 4.1 4.5 3.7 4.1 4.1  CL 103 102 100 101 102   CO2 24 25 27 23 24   GLUCOSE 138* 128* 125* 153* 130*  BUN 10 10 10 10 12   CREATININE 0.51 0.56 0.52 0.54 0.51  CALCIUM 7.5* 7.7* 7.8* 7.8* 7.6*  MG 2.0 1.8 1.8 1.8  --   PHOS 2.5 3.8 3.7 3.0  --    GFR: Estimated Creatinine Clearance: 63.2 mL/min (by C-G formula based on SCr of 0.51 mg/dL). Liver Function Tests: Recent Labs  Lab 03/11/20 0408 03/13/20 0615 03/14/20 0333 03/15/20 0355 03/17/20 0319  AST 84* 81* 77* 86* 75*  ALT 31 38 40 41 37  ALKPHOS 106 114 127* 139* 136*  BILITOT 0.7 1.0 0.8 0.9 0.9  PROT 5.7* 5.7* 5.9* 6.4* 6.1*  ALBUMIN 2.1* 2.0* 2.2* 2.3* 2.1*   No results for input(s): LIPASE, AMYLASE in the last 168 hours. No results for input(s): AMMONIA in the last 168 hours. Coagulation Profile: No results for input(s): INR, PROTIME in the last 168 hours. Cardiac Enzymes: No results for input(s): CKTOTAL, CKMB, CKMBINDEX, TROPONINI in the last 168 hours. BNP (last 3 results) No results for input(s): PROBNP in the  last 8760 hours. HbA1C: No results for input(s): HGBA1C in the last 72 hours. CBG: No results for input(s): GLUCAP in the last 168 hours. Lipid Profile: No results for input(s): CHOL, HDL, LDLCALC, TRIG, CHOLHDL, LDLDIRECT in the last 72 hours. Thyroid Function Tests: No results for input(s): TSH, T4TOTAL, FREET4, T3FREE, THYROIDAB in the last 72 hours. Anemia Panel: No results for input(s): VITAMINB12, FOLATE, FERRITIN, TIBC, IRON, RETICCTPCT in the last 72 hours.    Radiology Studies: I have reviewed all of the imaging during this hospital visit personally     Scheduled Meds: .  stroke: mapping our early stages of recovery book   Does not apply Once  . anastrozole  1 mg Oral Daily  . aspirin  81 mg Oral Daily  . levothyroxine  25 mcg Oral Q0600  . pantoprazole  40 mg Oral Daily  . polyethylene glycol  17 g Oral BID  . Ensure Max Protein  11 oz Oral BID   Continuous Infusions: . penicillin g continuous IV infusion Stopped (03/17/20  0243)     LOS: 7 days        Cartina Brousseau Gerome Apley, Hood

## 2020-03-17 NOTE — Progress Notes (Signed)
Patient's (L) arm is infiltrated, (R) arm is restricted. IV was stopped and IV team notified. (L)Arm kept elevated. New IV placed by IV team but states to use cautiously and recommends central line for future use. Doctor on call was notified via [place].

## 2020-03-17 NOTE — Progress Notes (Signed)
[name] for Infectious Disease    Date of Admission:  03/09/2020   Total days of antibiotics 4/day 2 peniciillin           ID: Jasmine Hood is a 74 y.o. female with  Streptococcal bacteremia in the setting of cerebral infarcts and covid-19 Principal Problem:   Weakness Active Problems:   Hypothyroidism   Essential hypertension   OSA (obstructive sleep apnea)   Malignant neoplasm of overlapping sites of right breast in female, estrogen receptor positive ([name])   Bone metastases ([name])   Stroke ([name])   COVID-19   Palliative care by specialist   Advanced care planning/counseling discussion   Goals of care, counseling/discussion    Subjective: Afebrile, patient out of the room  Medications:  . lidocaine      .  stroke: mapping our early stages of recovery book   Does not apply Once  . anastrozole  1 mg Oral Daily  . aspirin  81 mg Oral Daily  . levothyroxine  25 mcg Oral Q0600  . pantoprazole  40 mg Oral Daily  . polyethylene glycol  17 g Oral BID  . Ensure Max Protein  11 oz Oral BID    Objective: Vital signs in last 24 hours: Temp:  [97.9 F (36.6 C)-98.8 F (37.1 C)] 98.8 F (37.1 C) (10/29 1316) Pulse Rate:  [84-88] 85 (10/29 1316) Resp:  [18-20] 18 (10/29 1316) BP: (141-148)/(53-58) 145/58 (10/29 1316) SpO2:  [92 %-93 %] 93 % (10/29 1316) Weight:  [102.5 kg] 102.5 kg (10/29 0500)   Did not examine  Lab Results Recent Labs    03/15/20 0355 03/17/20 0319 03/17/20 0439  WBC 5.0  --  4.5  HGB 11.2*  --  10.2*  HCT 34.2*  --  30.9*  NA 133* 134*  --   K 4.1 4.1  --   CL 101 102  --   CO2 23 24  --   BUN 10 12  --   CREATININE 0.54 0.51  --    Liver Panel Recent Labs    03/15/20 0355 03/17/20 0319  PROT 6.4* 6.1*  ALBUMIN 2.3* 2.1*  AST 86* 75*  ALT 41 37  ALKPHOS 139* 136*  BILITOT 0.9 0.9   Sedimentation Rate No results for input(s): ESRSEDRATE in the last 72 hours. C-Reactive Protein No results for input(s): CRP in the last  72 hours.  Microbiology: 10/28 blood cx ngtd 10/26 blood cx- 4/4 bottles strep mitis/oralis 10/25 blood cx - strep mitis  STREPTOCOCCUS MITIS/ORALISAbnormal    Report Status 03/16/2020 FINAL   Organism ID, Bacteria STREPTOCOCCUS MITIS/ORALIS   Resulting Agency [facility]  Susceptibility   Streptococcus mitis/oralis    MIC    CEFTRIAXONE  Sensitive 1    CLINDAMYCIN >=1 RESISTANT  Resistant    PENICILLIN  Sensitive 2    TETRACYCLINE >=16 RESIST... Resistant    VANCOMYCIN 0.5 SENSITIVE  Sensitive         1 (Streptococcus mitis/oralis/CEFTRIAXONE)   SENSITIVE  0.12  Performed at [facility] [address], [place],  [address]  2 (Streptococcus mitis/oralis/PENICILLIN)   SENSITIVE       Studies/Results: [place] EKG SITE RITE  Result Date: 03/17/2020 If Site Rite image not attached, placement could not be confirmed due to current cardiac rhythm.    Assessment/Plan: Streptococcal bacteremia, complicated = concern that she may have endocarditis with septic emboli that would explain CNS findings, with multiple infarcts. Not clear cut  since she had declined TEE for confirmation. COVID-19 can also predispose person for hypercoagulability. The fact that she had ongoing bacteremia over 2 days would favor infectious causes of CNS findings.  Will recommend for her to do 4 wk of PCN 24MU continuous infusion via picc line -to ideally be placed tomorrow (48hrs after last cx)  Will need weekly labs  Can see her back on Monday if still hospitalized, dr Lucianne Lei dam available for questions.  opat orders listed below  Diagnosis: Complicated streptococcal bacteremia, presumed endocarditis  Culture Result: strep mitis/oralis  Allergies  Allergen Reactions  . Bee Venom Anaphylaxis  . Latex Swelling    OPAT Orders Discharge antibiotics to be given via PICC line Discharge antibiotics: Per pharmacy protocol  Penicillin 24MU continuous infusion  Duration: 4 wk End Date:  04/13/2020   [facility] Care Per Protocol:  Home health RN for IV administration and teaching; PICC line care and labs.    Labs weekly while on IV antibiotics: _x_ CBC with differential _x_ BMP   _x_ Please pull PIC at completion of IV antibiotics   Fax weekly labs to [phone]  Clinic Follow Up Appt: In 3-4 wk   @ [facility] with dr Avry Monteleone   [facility]ious [facility] Cell: [phone] Pager: [phone]  03/17/2020, 4:20 PM

## 2020-03-17 NOTE — Progress Notes (Signed)
Pt continues to decline use of CPAP qhs.  Order will be made PRN.  Pt encouraged to contact RT should she change her mind.

## 2020-03-18 DIAGNOSIS — E031 Congenital hypothyroidism without goiter: Secondary | ICD-10-CM | POA: Diagnosis not present

## 2020-03-18 DIAGNOSIS — U071 COVID-19: Secondary | ICD-10-CM | POA: Diagnosis not present

## 2020-03-18 DIAGNOSIS — I10 Essential (primary) hypertension: Secondary | ICD-10-CM | POA: Diagnosis not present

## 2020-03-18 DIAGNOSIS — C7951 Secondary malignant neoplasm of bone: Secondary | ICD-10-CM | POA: Diagnosis not present

## 2020-03-18 MED ORDER — PANTOPRAZOLE SODIUM 40 MG PO TBEC: 40.0000 mg | DELAYED_RELEASE_TABLET | Freq: Every day | ORAL | 0 refills | Status: DC

## 2020-03-18 MED ORDER — POLYETHYLENE GLYCOL 3350 17 G PO PACK
17.0000 g | PACK | Freq: Two times a day (BID) | ORAL | 0 refills | Status: DC
Start: 2020-03-18 — End: 2020-04-25

## 2020-03-18 MED ORDER — ASPIRIN 81 MG PO CHEW: 81.0000 mg | CHEWABLE_TABLET | Freq: Every day | ORAL | 0 refills | Status: AC

## 2020-03-18 MED ORDER — LEVOTHYROXINE SODIUM 25 MCG PO TABS: 25.0000 ug | ORAL_TABLET | Freq: Every day | ORAL | 0 refills | Status: DC

## 2020-03-18 MED ORDER — ENSURE MAX PROTEIN PO LIQD: 11.0000 [oz_av] | Freq: Two times a day (BID) | ORAL | 0 refills | Status: AC

## 2020-03-18 NOTE — Discharge Summary (Signed)
Physician Discharge Summary  Jasmine Hood QMG:[id number] DOB: 03-21-1946 DOA: 03/09/2020  PCP: [name], [name], MD  Admit date: 03/09/2020 Discharge date: 03/18/2020  Admitted From: Home  Disposition:  Home   Recommendations for Outpatient Follow-up and new medication changes:  1. Follow up with Dr. Jerilee Hoh in 7 days.  2. Continue antibiotic therapy until 04/13/20, continuous infusion of Penicillin.   Duration: 4 wk End Date: 04/13/2020 [facility] Care Per Protocol: Home health RN for IV administration and teaching; PICC line care and labs.  Labs weekly while on IV antibiotics: CBC with differential and BMP  Please pull PIC at completion of IV antibiotics Fax weekly labs to [phone]  Follow up with Dr Baxter Flattery in 3 to 4 weeks.   3. Added pantoprazole.  4. Follow up with Dr [name] as outpatient, she will benefit from palliative care.   Home Health: yes   Equipment/Devices: home abx    Discharge Condition: stable  CODE STATUS: dnr   Diet recommendation: regular   Brief/Interim Summary: Jasmine Hood was admitted to the hospital due to streptococcal bacteremia in the setting of a positive SARS COVID-19 infection. Complicated with brain septic emboli.   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 She reports poor oral intake, generalized malaise and fatigue. She collapsed to the floor about 2 days prior to hospitalization, unable to standback on her feet. On her initial physical examination blood pressure 123/68, heart rate77, respiratory rate27, oxygen saturation 91%,mild pallor, dry mucous membranes,lungs with scattered rhonchi no rales or wheezing,abdomen protuberant, soft, trace nonpitting lower extremity edema. Sodium 133, potassium 4.1, chloride 101, bicarb23, glucose 150, BUN10, creatinine 0.60,  calcium 7.5, albumin 2.5, AST96, ALT33, white count 9.5, hemoglobin 11.9, hematocrit 35.1, platelets 124.Urinalysis specific gravity 1.020, 0-5 red cells, 11-20 white cells.  Head CT with a 5 mm nodular hyperdensity within the posterior right temporal lobe. May reflect metastasis, small acute parenchymal hemorrhage cannot be excluded. Multiple known calvarial metastasis.  Patient tested positive for COVID-19 on admission.  Further work-up with brain MRI revealed acute ischemic left cerebellar infarct and concern for tiny subacute nonhemorrhagic infarct in the right centrum ovale.  10-24/10-25 patient was febrile, 10/26 blood cultures positive for Streptococcus midis/orabilis, she was placed on antibiotic therapy with IV ceftriaxone.  Transthoracic echocardiography no vegetations.  She declined transesophageal echocardiography.  Her brain lesions were determined to be infarct, possible septic emboli.  Neurology recommended aspirin and follow-up with neurology as an outpatient.  1.  Patient developed sepsis, not present on admission, related to streptococcal bacteremia.  She responded well to antibiotic therapy, Dr. Graylon Good from infectious disease has recommended to continue therapy with continuous infusion of penicillin, to complete April 13, 2020.  Her discharge white cell count is 4.5, she has remained afebrile, her follow-up blood cultures from 10/ 28 remain no growth. Patient underwent right IJ tunneled CVC catheter placement for antibiotic therapy.  2.  Acute left cerebellar infarct, hemorrhagic right frontal infarct.  Patient underwent further work-up with CT angiography of the head and neck which showed no emergent large vessel occlusion or high-grade stenosis.  Mild bilateral carotid bifurcation atherosclerosis without hemodynamic significant stenosis.  Neurology was consulted, she was seen to have septic emboli, recommendation to continue aspirin and have follow-up neurology  evaluation.  Physical therapy/occupational therapy evaluation the patient and recommended home health services.  3.  Hypothyroidism.  At home patient not taking levothyroxine, her TSH is mildly elevated 5.61, for now recommend low-dose levothyroxine..  4.  Metastatic breast cancer, thrombocytopenia/ anemia chronic due malignacy.  Patient with bilateral mastectomies.  She had adjuvant radiation therapy along with chemotherapy.  She has been taking anastrozole. Patient will follow up with Dr. [name] as an outpatient.  Follow-up with palliative care as an outpatient.  Hgb stable at 10.2 and hct at 30.9  5.  Hypertension.  Her blood pressure remained well controlled.  6.  Obesity class III, obstructive sleep apnea.  Calculated BMI is 45.9, continue CPAP as an outpatient.  7.  Hyponatremia.  Electrolytes were corrected, kidney function stable.  8.  Positive COVID-19 infection.  Further work-up with CT chest which was negative for pulmonary embolism, faint bilateral groundglass opacities. Considering malignancy, clinical immunosuppression and advanced age patient received monoclonal antibody with good toleration.  Patient had no increased oxygen requirements.  Discharge Diagnoses:  Principal Problem:   Weakness Active Problems:   Hypothyroidism   Essential hypertension   OSA (obstructive sleep apnea)   Malignant neoplasm of overlapping sites of right breast in female, estrogen receptor positive ([place])   Bone metastases ([place])   Stroke ([place])   COVID-19   Palliative care by specialist   Advanced care planning/counseling discussion   Goals of care, counseling/discussion    Discharge Instructions  Discharge Instructions    [facility] pharmacist to adjust dose for Vancomycin, Aminoglycosides and other anti-infective therapies as requested by physician.   Complete by: As directed    [facility] to provide Cath Flo 2mg    Complete by: As directed    Administer  for PICC line occlusion and as ordered by physician for other access device issues.   Anaphylaxis Kit: Provided to treat any anaphylactic reaction to the medication being provided to the patient if First Dose or when requested by physician   Complete by: As directed    Epinephrine 1mg /ml vial / amp: Administer 0.3mg  (0.81ml) subcutaneously once for moderate to severe anaphylaxis, nurse to call physician and pharmacy when reaction occurs and call 911 if needed for immediate care   Diphenhydramine 50mg /ml IV vial: Administer 25-50mg  IV/IM PRN for first dose reaction, rash, itching, mild reaction, nurse to call physician and pharmacy when reaction occurs   Sodium Chloride 0.9% NS 513ml IV: Administer if needed for hypovolemic blood pressure drop or as ordered by physician after call to physician with anaphylactic reaction   Change dressing on IV access line weekly and PRN   Complete by: As directed    Flush IV access with Sodium Chloride 0.9% and Heparin 10 units/ml or 100 units/ml   Complete by: As directed    Home infusion instructions - [facility]   Complete by: As directed    Instructions: Flush IV access with Sodium Chloride 0.9% and Heparin 10units/ml or 100units/ml   Change dressing on IV access line: Weekly and PRN   Instructions Cath Flo 2mg : Administer for PICC Line occlusion and as ordered by physician for other access device   [facility] pharmacist to adjust dose for: Vancomycin, Aminoglycosides and other anti-infective therapies as requested by physician   Method of administration may be changed at the discretion of home infusion pharmacist based upon assessment of the patient and/or caregiver's ability to self-administer the medication ordered   Complete by: As directed      Allergies as of 03/18/2020      Reactions   Bee Venom Anaphylaxis  Latex Swelling      Medication List    STOP taking these medications   calcium carbonate 500 MG chewable  tablet Commonly known as: Tums     TAKE these medications   anastrozole 1 MG tablet Commonly known as: ARIMIDEX Take 1 tablet (1 mg total) by mouth daily.   aspirin 81 MG chewable tablet Chew 1 tablet (81 mg total) by mouth daily. Start taking on: March 19, 2020   Ensure Max Protein Liqd Take 330 mLs (11 oz total) by mouth 2 (two) times daily.   levothyroxine 25 MCG tablet Commonly known as: SYNTHROID Take 1 tablet (25 mcg total) by mouth daily at 6 (six) AM.   pantoprazole 40 MG tablet Commonly known as: PROTONIX Take 1 tablet (40 mg total) by mouth daily. Start taking on: March 19, 2020   penicillin G  IVPB Inject 24 Million Units into the vein daily for 26 days. As a continuous infusion. Indication:  Possible endocarditis First Dose: No Last Day of Therapy:  04/11/2020 Labs - Once weekly:  CBC/D and BMP, Labs - Every other week:  ESR and CRP Method of administration: Elastomeric (Continuous infusion) Method of administration may be changed at the discretion of home infusion pharmacist based upon assessment of the patient and/or caregiver's ability to self-administer the medication ordered.   polyethylene glycol 17 g packet Commonly known as: MIRALAX / GLYCOLAX Take 17 g by mouth 2 (two) times daily.            Discharge Care Instructions  (From admission, onward)         Start     Ordered   03/17/20 0000  Change dressing on IV access line weekly and PRN  (Home infusion instructions - [facility] )        03/17/20 1241          Follow-up Information    [facility] Follow up.   Specialty: Home Health Services Why: This is the home health agency that will be following up with you at home for services Contact information: [address] [place] [address] [phone]        Philip Aspen, Limmie Patricia, MD Follow up in 1 week(s).   Specialty: Internal Medicine Contact information: [address]  [place] [address] [phone]              Allergies  Allergen Reactions  . Bee Venom Anaphylaxis  . Latex Swelling    Consultations:  ID   IR   Neurology    Procedures/Studies: CT ANGIO HEAD W OR WO CONTRAST  Result Date: 03/10/2020 CLINICAL DATA:  Stroke follow-up EXAM: CT ANGIOGRAPHY HEAD AND NECK TECHNIQUE: Multidetector CT imaging of the head and neck was performed using the standard protocol during bolus administration of intravenous contrast. Multiplanar CT image reconstructions and MIPs were obtained to evaluate the vascular anatomy. Carotid stenosis measurements (when applicable) are obtained utilizing NASCET criteria, using the distal internal carotid diameter as the denominator. CONTRAST:  OMNIPAQUE IOHEXOL 350 MG/ML SOLN COMPARISON:  Brain MRI 03/09/2020 FINDINGS: CT HEAD FINDINGS Brain: Unchanged punctate focus of blood in the posterior right temporal lobe (7:44). The size and configuration of the ventricles and extra-axial CSF spaces are normal. Skull: Multiple sclerotic calvarial lesions. Sinuses/Orbits: No fluid levels or advanced mucosal thickening of the visualized paranasal sinuses. No mastoid or middle ear effusion. The orbits are normal. CTA NECK FINDINGS SKELETON: Multiple ill-defined sclerotic foci.  No fracture.  OTHER NECK: Normal pharynx, larynx and major salivary glands. No cervical lymphadenopathy. Unremarkable thyroid gland. UPPER CHEST: No pneumothorax or pleural effusion. No nodules or masses. AORTIC ARCH: There is no calcific atherosclerosis of the aortic arch. There is no aneurysm, dissection or hemodynamically significant stenosis of the visualized portion of the aorta. Conventional 3 vessel aortic branching pattern. The visualized proximal subclavian arteries are widely patent. RIGHT CAROTID SYSTEM: No dissection, occlusion or aneurysm. Mild atherosclerotic calcification at the carotid bifurcation without hemodynamically significant  stenosis. LEFT CAROTID SYSTEM: No dissection, occlusion or aneurysm. Mild atherosclerotic calcification at the carotid bifurcation without hemodynamically significant stenosis. VERTEBRAL ARTERIES: Left dominant configuration. Both origins are clearly patent. There is no dissection, occlusion or flow-limiting stenosis to the skull base (V1-V3 segments). CTA HEAD FINDINGS POSTERIOR CIRCULATION: --Vertebral arteries: Normal V4 segments. --Inferior cerebellar arteries: Normal. --Basilar artery: Normal. --Superior cerebellar arteries: Normal. --Posterior cerebral arteries (PCA): Normal. ANTERIOR CIRCULATION: --Intracranial internal carotid arteries: Normal. --Anterior cerebral arteries (ACA): Normal. Both A1 segments are present. Patent anterior communicating artery (a-comm). --Middle cerebral arteries (MCA): Normal. VENOUS SINUSES: As permitted by contrast timing, patent. ANATOMIC VARIANTS: None Review of the MIP images confirms the above findings. IMPRESSION: 1. Unchanged punctate focus of blood in the posterior right temporal lobe. 2. No emergent large vessel occlusion or high-grade stenosis of the intracranial or cervical arteries. 3. Mild bilateral carotid bifurcation atherosclerosis without hemodynamically significant stenosis. 4. Multiple ill-defined sclerotic calvarial and vertebral lesions, compatible with known metastatic disease. Electronically Signed   By: Ulyses Jarred M.D.   On: 03/10/2020 23:20   DG Abd 1 View  Result Date: 03/13/2020 CLINICAL DATA:  Abdominal pain and distension EXAM: ABDOMEN - 1 VIEW COMPARISON:  March 09, 2020 FINDINGS: There is moderate stool throughout the colon. No bowel dilatation or air-fluid level to suggest bowel obstruction. No free air. Scattered foci of vascular calcification noted. IMPRESSION: Moderate stool in colon.  No evident bowel obstruction or free air. Electronically Signed   By: Lowella Grip III M.D.   On: 03/13/2020 14:37   CT Head Wo  Contrast  Result Date: 03/09/2020 CLINICAL DATA:  Head trauma, minor. Additional history provided: Patient found down, stage IV breast cancer with known metastases. EXAM: CT HEAD WITHOUT CONTRAST TECHNIQUE: Contiguous axial images were obtained from the base of the skull through the vertex without intravenous contrast. COMPARISON:  Brain MRI 09/27/2017.  Head CT 02/19/2017. FINDINGS: Brain: Mild generalized cerebral atrophy. There is a 5 mm nodular hyperdensity focus measuring 60 Hounsfield units within the posterior right temporal lobe (series 5, image 43) (series 6, image 11). No demarcated cortical infarct. No extra-axial fluid collection. No midline shift. Partially empty sella turcica. Vascular: No hyperdense vessel. Skull: Multiple known calvarial metastases were better appreciated on the prior brain MRI of 09/27/2017. Sinuses/Orbits: Visualized orbits show no acute finding. Frothy secretions within the right sphenoid sinus. No significant mastoid effusion. These results were called by telephone at the time of interpretation on 03/09/2020 at 2:57 pm to provider [facility] , who verbally acknowledged these results. IMPRESSION: 5 mm nodular hyperdensity within the posterior right temporal as described. This may reflect a hyperdense metastasis. A small acute parenchymal hemorrhage cannot be excluded. Mild generalized cerebral atrophy. Multiple known calvarial metastases were better appreciated on the prior brain MRI of 09/27/2017. Right sphenoid sinusitis. Electronically Signed   By: Kellie Simmering DO   On: 03/09/2020 14:58   CT ANGIO NECK W OR WO CONTRAST  Result Date: 03/10/2020 CLINICAL DATA:  Stroke  follow-up EXAM: CT ANGIOGRAPHY HEAD AND NECK TECHNIQUE: Multidetector CT imaging of the head and neck was performed using the standard protocol during bolus administration of intravenous contrast. Multiplanar CT image reconstructions and MIPs were obtained to evaluate the vascular anatomy. Carotid  stenosis measurements (when applicable) are obtained utilizing NASCET criteria, using the distal internal carotid diameter as the denominator. CONTRAST:  153mL OMNIPAQUE IOHEXOL 350 MG/ML SOLN COMPARISON:  Brain MRI 03/09/2020 FINDINGS: CT HEAD FINDINGS Brain: Unchanged punctate focus of blood in the posterior right temporal lobe (7:44). The size and configuration of the ventricles and extra-axial CSF spaces are normal. Skull: Multiple sclerotic calvarial lesions. Sinuses/Orbits: No fluid levels or advanced mucosal thickening of the visualized paranasal sinuses. No mastoid or middle ear effusion. The orbits are normal. CTA NECK FINDINGS SKELETON: Multiple ill-defined sclerotic foci.  No fracture. OTHER NECK: Normal pharynx, larynx and major salivary glands. No cervical lymphadenopathy. Unremarkable thyroid gland. UPPER CHEST: No pneumothorax or pleural effusion. No nodules or masses. AORTIC ARCH: There is no calcific atherosclerosis of the aortic arch. There is no aneurysm, dissection or hemodynamically significant stenosis of the visualized portion of the aorta. Conventional 3 vessel aortic branching pattern. The visualized proximal subclavian arteries are widely patent. RIGHT CAROTID SYSTEM: No dissection, occlusion or aneurysm. Mild atherosclerotic calcification at the carotid bifurcation without hemodynamically significant stenosis. LEFT CAROTID SYSTEM: No dissection, occlusion or aneurysm. Mild atherosclerotic calcification at the carotid bifurcation without hemodynamically significant stenosis. VERTEBRAL ARTERIES: Left dominant configuration. Both origins are clearly patent. There is no dissection, occlusion or flow-limiting stenosis to the skull base (V1-V3 segments). CTA HEAD FINDINGS POSTERIOR CIRCULATION: --Vertebral arteries: Normal V4 segments. --Inferior cerebellar arteries: Normal. --Basilar artery: Normal. --Superior cerebellar arteries: Normal. --Posterior cerebral arteries (PCA): Normal. ANTERIOR  CIRCULATION: --Intracranial internal carotid arteries: Normal. --Anterior cerebral arteries (ACA): Normal. Both A1 segments are present. Patent anterior communicating artery (a-comm). --Middle cerebral arteries (MCA): Normal. VENOUS SINUSES: As permitted by contrast timing, patent. ANATOMIC VARIANTS: None Review of the MIP images confirms the above findings. IMPRESSION: 1. Unchanged punctate focus of blood in the posterior right temporal lobe. 2. No emergent large vessel occlusion or high-grade stenosis of the intracranial or cervical arteries. 3. Mild bilateral carotid bifurcation atherosclerosis without hemodynamically significant stenosis. 4. Multiple ill-defined sclerotic calvarial and vertebral lesions, compatible with known metastatic disease. Electronically Signed   By: Ulyses Jarred M.D.   On: 03/10/2020 23:20   CT ANGIO CHEST PE W OR WO CONTRAST  Result Date: 03/11/2020 CLINICAL DATA:  COVID-19 infection, positive D-dimer, stroke and history of metastatic breast carcinoma. EXAM: CT ANGIOGRAPHY CHEST WITH CONTRAST TECHNIQUE: Multidetector CT imaging of the chest was performed using the standard protocol during bolus administration of intravenous contrast. Multiplanar CT image reconstructions and MIPs were obtained to evaluate the vascular anatomy. CONTRAST:  167mL OMNIPAQUE IOHEXOL 350 MG/ML SOLN COMPARISON:  CT of the chest on 06/08/2018 FINDINGS: Cardiovascular: Central pulmonary artery delineation and opacification is satisfactory and there is no evidence of significant central pulmonary embolism. Peripheral pulmonary arterial opacification and evaluation is severely limited by respiratory motion. The thoracic aorta is well opacified and demonstrates normal caliber with no evidence of aneurysmal disease or dissection. The heart size is normal. No pericardial fluid identified. No significant calcified coronary artery plaque identified. Mediastinum/Nodes: There are some small scattered mediastinal  lymph nodes. The largest measures approximately 10 mm in short axis in the right lower paratracheal region. No overtly enlarged lymph nodes identified. Lungs/Pleura: Lungs demonstrate scattered areas of interstitial prominence bilaterally which  were partially present on the prior scan. Some of these areas may represent active COVID pneumonia. Others are likely related to chronic disease and prior radiation therapy. Upper Abdomen: No acute abnormality. Musculoskeletal: Sclerotic metastatic disease involving multiple thoracic vertebral bodies is less prominent compared to the prior CT with some residual areas of mild sclerosis present. No associated pathologic fractures. There is a decrease in size of a chronic seroma of the right lateral chest wall after prior mastectomy. Review of the MIP images confirms the above findings. IMPRESSION: 1. No evidence of significant central pulmonary embolism. Peripheral pulmonary arterial opacification and evaluation is severely limited by respiratory motion. 2. Scattered areas of interstitial prominence bilaterally which were partially present on the prior scan. Some of these areas may represent active COVID pneumonia. Others are likely related to chronic disease and prior radiation therapy. 3. Decrease in size of a chronic seroma of the right lateral chest wall after prior mastectomy. 4. Decreased conspicuity of sclerotic metastatic disease involving multiple thoracic vertebral bodies. Electronically Signed   By: Aletta Edouard M.D.   On: 03/11/2020 13:26   MR BRAIN WO CONTRAST  Result Date: 03/09/2020 CLINICAL DATA:  Initial evaluation for acute nausea, vomiting, weakness. History of breast cancer. EXAM: MRI HEAD WITHOUT CONTRAST TECHNIQUE: Multiplanar, multiecho pulse sequences of the brain and surrounding structures were obtained without intravenous contrast. COMPARISON:  Prior CT from earlier same day as well as previous brain MRI from 09/27/2017. FINDINGS: Brain:  Diffuse prominence of the CSF containing spaces compatible generalized age-related cerebral atrophy. Patchy T2/FLAIR hyperintensity within the periventricular and deep white matter both cerebral hemispheres most consistent with chronic small vessel ischemic disease, mild for age, and relatively stable. 9 mm linear focus of restricted diffusion seen involving the left cerebellum, consistent with an acute ischemic infarct (series 5, image 69). No associated hemorrhage or mass effect. Few additional punctate foci of diffusion abnormality involving the right centrum semi ovale noted as well, which could reflect tiny subacute small vessel type infarcts (series 18, images 45, 41). No associated hemorrhage. Gray-white matter differentiation otherwise maintained. No other areas of chronic cortical infarction. 7 mm focus of susceptibility artifacts seen involving the right temporal lobe, corresponding to prior hyperdensity seen on prior CT (series 15, image 25). This is new as compared to prior MRI from 2019. Subtly increased localized T2/FLAIR signal intensity seen at this location suggesting edema (series 16, image 25). No significant regional mass effect. Given the history of metastatic breast cancer, finding is most concerning for a possible new intracranial metastasis. No other definite metastatic lesions are seen on this noncontrast examination. No midline shift or mass effect. Ventricles stable in size without hydrocephalus. No extra-axial fluid collection. No made of an empty sella with sellar expansion. Midline structures intact. Vascular: Major intracranial vascular flow voids are maintained. Skull and upper cervical spine: Craniocervical junction within normal limits. Few scattered T1 hypointense lesions noted about the calvarium, consistent with known osseous metastatic disease. No visible extra osseous or soft tissue component. No scalp soft tissue abnormality. Sinuses/Orbits: Patient status post bilateral  ocular lens replacement. Globes and orbital soft tissues demonstrate no acute finding. Right sphenoid sinus disease noted. Paranasal sinuses are otherwise clear. Trace left mastoid effusion noted, of doubtful significance. Inner ear structures grossly normal. Other: None. IMPRESSION: 1. 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 2. Few additional punctate foci of diffusion abnormality involving the right centrum semi ovale, suspicious for tiny subacute nonhemorrhagic small vessel type infarcts. 3. 7 mm  focus of susceptibility artifact involving the right temporal lobe, corresponding to hyperdensity seen on prior CT. Subtly increased localized T2/FLAIR signal intensity at this location without significant regional mass effect. Given the history of metastatic breast cancer, finding is most concerning for a possible hemorrhagic metastasis. If possible, further evaluation with postcontrast imaging may be helpful for further evaluation. Alternatively, a short interval follow-up study in 2-3 months to evaluate for interval change may be helpful as well. 4. Scattered T1 hypointense lesions about the calvarium, consistent with known osseous metastatic disease. 5. Age-related cerebral atrophy with mild chronic small vessel ischemic disease. Electronically Signed   By: Jeannine Boga M.D.   On: 03/09/2020 22:15   MR BRAIN W WO CONTRAST  Result Date: 03/15/2020 CLINICAL DATA:  Possible hemorrhagic metastasis. EXAM: MRI HEAD WITHOUT AND WITH CONTRAST TECHNIQUE: Multiplanar, multiecho pulse sequences of the brain and surrounding structures were obtained without and with intravenous contrast. 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 There are new foci of abnormal diffusion restriction within the right corona radiata and frontal lobe. Multifocal hyperintense T2-weighted signal within the  white matter. Normal volume of CSF spaces. Punctate focus hemorrhage in the right temporal lobe is unchanged. There are no contrast-enhancing lesions. Normal midline structures. Vascular: Normal flow voids. Skull and upper cervical spine: Multiple hypointense T1-weighted signal lesions of the calvarium are unchanged. Sinuses/Orbits: Negative. Other: None. IMPRESSION: 1. Multiple new foci of abnormal diffusion restriction within the right corona radiata and frontal lobe, consistent with acute to subacute infarcts. No hemorrhage or mass effect. 2. 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 3. Unchanged punctate focus of hemorrhage in the right temporal lobe. No abnormal contrast enhancement. Electronically Signed   By: Ulyses Jarred M.D.   On: 03/15/2020 00:16   IR Fluoro Guide CV Line Right  Result Date: 03/17/2020 CLINICAL DATA:  COVID-19 infection, streptococcal bacteremia and cerebral infarction. History of bilateral mastectomy. Need for tunneled central venous catheter for long-term IV antibiotic therapy. EXAM: TUNNELED CENTRAL VENOUS CATHETER PLACEMENT WITH ULTRASOUND AND FLUOROSCOPIC GUIDANCE ANESTHESIA/SEDATION: None MEDICATIONS: None FLUOROSCOPY TIME:  30 seconds. PROCEDURE: The procedure, risks, benefits, and alternatives were explained to the patient. Questions regarding the procedure were encouraged and answered. The patient understands and consents to the procedure. A timeout was performed prior to initiating the procedure. The right neck and chest were prepped with chlorhexidine in a sterile fashion, and a sterile drape was applied covering the operative field. Maximum barrier sterile technique with sterile gowns and gloves were used for the procedure. Local anesthesia was provided with 1% lidocaine. Ultrasound was used to confirm patency of the right internal jugular vein. After creating a small venotomy incision, a 21 gauge needle was advanced into the right internal jugular vein under  direct, real-time ultrasound guidance. Ultrasound image documentation was performed. After securing guidewire access, a 6 French peel-away sheath was placed. A wire was kinked to measure appropriate catheter length. A 6 French, dual-lumen power line tunneled central venous catheter was chosen for placement. This was tunneled in a retrograde fashion from the chest wall to the venotomy incision. The catheter was cut to 23 cm based on guidewire measurement. The catheter was then placed through the sheath and the sheath removed. Final catheter positioning was confirmed and documented with a fluoroscopic spot image. The catheter was aspirated and flushed with saline. The venotomy incision was closed with subcuticular 4-0 Vicryl. Dermabond was  applied to the incision. The catheter exit site was secured with Prolene retention sutures. COMPLICATIONS: None.  No pneumothorax. FINDINGS: After catheter placement, the tip lies at the SVC/RA junction. The catheter aspirates normally and is ready for immediate use. IMPRESSION: Placement of tunneled central venous catheter via the right internal jugular vein. The catheter tip lies at the SVC/RA junction. The catheter is ready for immediate use. Electronically Signed   By: Aletta Edouard M.D.   On: 03/17/2020 17:03   IR US Guide Vasc Access Right  Result Date: 03/17/2020 CLINICAL DATA:  COVID-19 infection, streptococcal bacteremia and cerebral infarction. History of bilateral mastectomy. Need for tunneled central venous catheter for long-term IV antibiotic therapy. EXAM: TUNNELED CENTRAL VENOUS CATHETER PLACEMENT WITH ULTRASOUND AND FLUOROSCOPIC GUIDANCE ANESTHESIA/SEDATION: None MEDICATIONS: None FLUOROSCOPY TIME:  30 seconds. PROCEDURE: The procedure, risks, benefits, and alternatives were explained to the patient. Questions regarding the procedure were encouraged and answered. The patient understands and consents to the procedure. A timeout was performed prior to initiating  the procedure. The right neck and chest were prepped with chlorhexidine in a sterile fashion, and a sterile drape was applied covering the operative field. Maximum barrier sterile technique with sterile gowns and gloves were used for the procedure. Local anesthesia was provided with 1% lidocaine. Ultrasound was used to confirm patency of the right internal jugular vein. After creating a small venotomy incision, a 21 gauge needle was advanced into the right internal jugular vein under direct, real-time ultrasound guidance. Ultrasound image documentation was performed. After securing guidewire access, a 6 French peel-away sheath was placed. A wire was kinked to measure appropriate catheter length. A 6 French, dual-lumen power line tunneled central venous catheter was chosen for placement. This was tunneled in a retrograde fashion from the chest wall to the venotomy incision. The catheter was cut to 23 cm based on guidewire measurement. The catheter was then placed through the sheath and the sheath removed. Final catheter positioning was confirmed and documented with a fluoroscopic spot image. The catheter was aspirated and flushed with saline. The venotomy incision was closed with subcuticular 4-0 Vicryl. Dermabond was applied to the incision. The catheter exit site was secured with Prolene retention sutures. COMPLICATIONS: None.  No pneumothorax. FINDINGS: After catheter placement, the tip lies at the SVC/RA junction. The catheter aspirates normally and is ready for immediate use. IMPRESSION: Placement of tunneled central venous catheter via the right internal jugular vein. The catheter tip lies at the SVC/RA junction. The catheter is ready for immediate use. Electronically Signed   By: Aletta Edouard M.D.   On: 03/17/2020 17:03   DG CHEST PORT 1 VIEW  Result Date: 03/14/2020 CLINICAL DATA:  COVID pneumonia. EXAM: PORTABLE CHEST 1 VIEW COMPARISON:  One-view chest x-ray 03/13/2020 FINDINGS: Heart size  exaggerated by low lung volumes. Interstitial and airspace opacities are again noted, greatest in the right upper lobe and left lower lobe. Overall aeration is improving. Degenerative changes are noted at the shoulders. IMPRESSION: Improving aeration with persistent interstitial and airspace disease in the right upper lobe and left lower lobe. Electronically Signed   By: San Morelle M.D.   On: 03/14/2020 06:06   DG CHEST PORT 1 VIEW  Result Date: 03/13/2020 CLINICAL DATA:  Shortness of breath, COVID positive EXAM: PORTABLE CHEST 1 VIEW COMPARISON:  03/10/2020 FINDINGS: Portion of the left lung base is excluded. Slightly increased interstitial prominence. No significant pleural effusion. No pneumothorax. Stable cardiomediastinal contours. IMPRESSION: Slightly increased interstitial prominence, which may reflect worsening  edema or pneumonia. Electronically Signed   By: Macy Mis M.D.   On: 03/13/2020 08:51   DG CHEST PORT 1 VIEW  Result Date: 03/10/2020 CLINICAL DATA:  COVID EXAM: PORTABLE CHEST 1 VIEW COMPARISON:  11/10/2019 FINDINGS: Shallow inspiration. Cardiac enlargement. Interstitial changes in the lungs may represent multifocal pneumonia or edema. No pleural effusions. No pneumothorax. Mediastinal contours appear intact. Degenerative changes in the spine. IMPRESSION: Cardiac enlargement. Interstitial changes in the lungs may represent multifocal pneumonia or edema. Electronically Signed   By: Lucienne Capers M.D.   On: 03/10/2020 04:39   DG Abdomen Acute W/Chest  Result Date: 03/09/2020 CLINICAL DATA:  Metastatic breast cancer.  Weakness. EXAM: DG ABDOMEN ACUTE WITH 1 VIEW CHEST COMPARISON:  Chest radiograph 11/10/2019. Abdominal radiographs 11/24/2008. FINDINGS: The cardiomediastinal silhouette is unchanged. Asymmetric opacity is again noted in the right upper lobe consistent with post radiation changes. The interstitial markings are mildly prominent bilaterally without overt  edema. No acute airspace consolidation, sizeable pleural effusion, pneumothorax is identified. No acute osseous abnormality is seen. There is no evidence of intraperitoneal free air. Gas is present in nondilated colon. No dilated loops of bowel are seen to suggest obstruction. IMPRESSION: Nonobstructed bowel gas pattern. Chronic lung changes without evidence of acute cardiopulmonary disease. Electronically Signed   By: Logan Bores M.D.   On: 03/09/2020 14:32   ECHOCARDIOGRAM COMPLETE  Result Date: 03/10/2020    ECHOCARDIOGRAM REPORT   Patient Name:   Jasmine Hood Date of Exam: 03/10/2020 Medical Rec #:  [id number]      Height:       59.0 in Accession #:    [id number]     Weight:       226.0 lb Date of Birth:  [birth year]      BSA:          1.943 m Patient Age:    18 years       BP:           136/68 mmHg Patient Gender: F              HR:           87 bpm. Exam Location:  Inpatient Procedure: 2D Echo, Cardiac Doppler, Color Doppler and Intracardiac            Opacification Agent Indications:   Stroke 434.91 / I163.9  History:       Patient has no prior history of Echocardiogram examinations.                Stroke.  Sonographer:   Bernadene Person RDCS Referring      [place] Phys: IMPRESSIONS  1. Left ventricular ejection fraction, by estimation, is 70 to 75%. The left ventricle has hyperdynamic function. The left ventricle has no regional wall motion abnormalities. Left ventricular diastolic parameters were normal.  2. Right ventricular systolic function is normal. The right ventricular size is normal.  3. The mitral valve is normal in structure. Mild mitral valve regurgitation. No evidence of mitral stenosis.  4. The aortic valve is tricuspid. Aortic valve regurgitation is mild. Mild aortic valve sclerosis is present, with no evidence of aortic valve stenosis.  5. The inferior vena cava is normal in size with greater than 50% respiratory variability, suggesting right atrial pressure of 3  mmHg. FINDINGS  Left Ventricle: Left ventricular ejection fraction, by estimation, is 70 to 75%. The left ventricle has hyperdynamic function. The left ventricle has no regional wall motion  abnormalities. Definity contrast agent was given IV to delineate the left ventricular endocardial borders. The left ventricular internal cavity size was normal in size. There is no left ventricular hypertrophy. Left ventricular diastolic parameters were normal. Normal left ventricular filling pressure. Right Ventricle: The right ventricular size is normal. No increase in right ventricular wall thickness. Right ventricular systolic function is normal. Left Atrium: Left atrial size was normal in size. Right Atrium: Right atrial size was normal in size. Pericardium: There is no evidence of pericardial effusion. Mitral Valve: The mitral valve is normal in structure. Mild mitral valve regurgitation. No evidence of mitral valve stenosis. Tricuspid Valve: The tricuspid valve is normal in structure. Tricuspid valve regurgitation is not demonstrated. No evidence of tricuspid stenosis. Aortic Valve: The aortic valve is tricuspid. Aortic valve regurgitation is mild. Mild aortic valve sclerosis is present, with no evidence of aortic valve stenosis. Pulmonic Valve: The pulmonic valve was normal in structure. Pulmonic valve regurgitation is not visualized. No evidence of pulmonic stenosis. Aorta: The aortic root is normal in size and structure. Venous: The inferior vena cava is normal in size with greater than 50% respiratory variability, suggesting right atrial pressure of 3 mmHg. IAS/Shunts: No atrial level shunt detected by color flow Doppler.  LEFT VENTRICLE PLAX 2D LVIDd:         4.20 cm  Diastology LVIDs:         2.50 cm  LV e' medial:    9.03 cm/s LV PW:         0.90 cm  LV E/e' medial:  9.3 LV IVS:        0.90 cm  LV e' lateral:   11.00 cm/s LVOT diam:     2.10 cm  LV E/e' lateral: 7.6 LV SV:         108 LV SV Index:   55 LVOT Area:      3.46 cm  RIGHT VENTRICLE RV S prime:     14.20 cm/s TAPSE (M-mode): 1.6 cm LEFT ATRIUM             Index       RIGHT ATRIUM           Index LA diam:        3.70 cm 1.90 cm/m  RA Area:     10.20 cm LA Vol (A2C):   37.9 ml 19.51 ml/m RA Volume:   17.50 ml  9.01 ml/m LA Vol (A4C):   34.4 ml 17.71 ml/m LA Biplane Vol: 38.3 ml 19.71 ml/m  AORTIC VALVE LVOT Vmax:   129.00 cm/s LVOT Vmean:  99.500 cm/s LVOT VTI:    0.311 m  AORTA Ao Root diam: 2.30 cm Ao Asc diam:  3.10 cm MITRAL VALVE                TRICUSPID VALVE MV Area (PHT): 3.31 cm     TR Peak grad:   21.5 mmHg MV Decel Time: 229 msec     TR Vmax:        232.00 cm/s MV E velocity: 83.90 cm/s MV A velocity: 103.00 cm/s  SHUNTS MV E/A ratio:  0.81         Systemic VTI:  0.31 m                             Systemic Diam: 2.10 cm Fransico Him MD Electronically signed by Fransico Him MD Signature Date/Time: 03/10/2020/2:34:49 PM  Final    VAS US CAROTID  Result Date: 03/14/2020 Carotid Arterial Duplex Study Indications: CVA. Limitations  Today's exam was limited due to the patient's respiratory              variation. Performing Technologist: Abram Sander RVS  Examination Guidelines: A complete evaluation includes B-mode imaging, spectral Doppler, color Doppler, and power Doppler as needed of all accessible portions of each vessel. Bilateral testing is considered an integral part of a complete examination. Limited examinations for reoccurring indications may be performed as noted.  Right Carotid Findings: +----------+--------+--------+--------+------------------+--------+           PSV cm/sEDV cm/sStenosisPlaque DescriptionComments +----------+--------+--------+--------+------------------+--------+ CCA Prox  70      13              heterogenous               +----------+--------+--------+--------+------------------+--------+ CCA Distal75      17              heterogenous                +----------+--------+--------+--------+------------------+--------+ ICA Prox  128     32      1-39%   heterogenous               +----------+--------+--------+--------+------------------+--------+ ICA Distal94      22                                         +----------+--------+--------+--------+------------------+--------+ ECA       55      12                                         +----------+--------+--------+--------+------------------+--------+ +----------+--------+-------+--------+-------------------+           PSV cm/sEDV cmsDescribeArm Pressure (mmHG) +----------+--------+-------+--------+-------------------+ [id number]                                         +----------+--------+-------+--------+-------------------+ +---------+--------+--------+--------------+ VertebralPSV cm/sEDV cm/sNot identified +---------+--------+--------+--------------+  Left Carotid Findings: +----------+--------+--------+--------+------------------+--------+           PSV cm/sEDV cm/sStenosisPlaque DescriptionComments +----------+--------+--------+--------+------------------+--------+ CCA Prox  95                      heterogenous               +----------+--------+--------+--------+------------------+--------+ CCA Distal82      27              heterogenous               +----------+--------+--------+--------+------------------+--------+ ICA Prox  135     38      1-39%   heterogenous               +----------+--------+--------+--------+------------------+--------+ ICA Distal110     37                                         +----------+--------+--------+--------+------------------+--------+ ECA       120     10                                         +----------+--------+--------+--------+------------------+--------+ +----------+--------+--------+--------+-------------------+  PSV cm/sEDV cm/sDescribeArm Pressure (mmHG)  +----------+--------+--------+--------+-------------------+ [id number]                                          +----------+--------+--------+--------+-------------------+ +---------+--------+--+--------+--+---------+ VertebralPSV cm/s66EDV cm/s20Antegrade +---------+--------+--+--------+--+---------+   Summary: Right Carotid: Velocities in the right ICA are consistent with a 1-39% stenosis. Left Carotid: Velocities in the left ICA are consistent with a 1-39% stenosis. Vertebrals: Left vertebral artery demonstrates antegrade flow. Right vertebral             artery was not visualized. *See table(s) above for measurements and observations.  Electronically signed by Antony Contras MD on 03/14/2020 at 2:07:34 PM.    Final    VAS [place] LOWER EXTREMITY VENOUS (DVT)  Result Date: 03/10/2020  Lower Venous DVTStudy Indications: Stroke.  Limitations: Pain tolerance. Comparison Study: no prior Performing Technologist: Abram Sander RVS  Examination Guidelines: A complete evaluation includes B-mode imaging, spectral Doppler, color Doppler, and power Doppler as needed of all accessible portions of each vessel. Bilateral testing is considered an integral part of a complete examination. Limited examinations for reoccurring indications may be performed as noted. The reflux portion of the exam is performed with the patient in reverse Trendelenburg.  +---------+---------------+---------+-----------+----------+--------------+ RIGHT    CompressibilityPhasicitySpontaneityPropertiesThrombus Aging +---------+---------------+---------+-----------+----------+--------------+ CFV      Full           Yes      Yes                                 +---------+---------------+---------+-----------+----------+--------------+ SFJ      Full                                                        +---------+---------------+---------+-----------+----------+--------------+ FV Prox  Full                                                         +---------+---------------+---------+-----------+----------+--------------+ FV Mid                  Yes      Yes                                 +---------+---------------+---------+-----------+----------+--------------+ FV Distal               Yes      Yes                                 +---------+---------------+---------+-----------+----------+--------------+ PFV      Full                                                        +---------+---------------+---------+-----------+----------+--------------+ POP      Full  Yes      Yes                                 +---------+---------------+---------+-----------+----------+--------------+ PTV      Full                                                        +---------+---------------+---------+-----------+----------+--------------+ PERO                                                  Not visualized +---------+---------------+---------+-----------+----------+--------------+   +---------+---------------+---------+-----------+----------+--------------+ LEFT     CompressibilityPhasicitySpontaneityPropertiesThrombus Aging +---------+---------------+---------+-----------+----------+--------------+ CFV      Full           Yes      Yes                                 +---------+---------------+---------+-----------+----------+--------------+ SFJ      Full                                                        +---------+---------------+---------+-----------+----------+--------------+ FV Prox  Full                                                        +---------+---------------+---------+-----------+----------+--------------+ FV Mid                  Yes      Yes                                 +---------+---------------+---------+-----------+----------+--------------+ FV Distal               Yes      Yes                                  +---------+---------------+---------+-----------+----------+--------------+ PFV      Full                                                        +---------+---------------+---------+-----------+----------+--------------+ POP      Full           Yes      Yes                                 +---------+---------------+---------+-----------+----------+--------------+ PTV      Full                                                        +---------+---------------+---------+-----------+----------+--------------+  PERO                                                  Not visualized +---------+---------------+---------+-----------+----------+--------------+     Summary: BILATERAL: - No evidence of deep vein thrombosis seen in the lower extremities, bilaterally. - No evidence of superficial venous thrombosis in the lower extremities, bilaterally. -   *See table(s) above for measurements and observations. Electronically signed by Servando Snare MD on 03/10/2020 at 1:10:27 PM.    Final    [place] EKG SITE RITE  Result Date: 03/17/2020 If Site Rite image not attached, placement could not be confirmed due to current cardiac rhythm.     Procedures:  Right IJ central line   Subjective: Patient is feeling better, no nausea or vomiting, no chest pain.   Discharge Exam: Vitals:   03/17/20 2048 03/18/20 0513  BP: (!) 155/70 (!) 145/58  Pulse: 89 91  Resp: 20 18  Temp: 98.4 F (36.9 C) 98.6 F (37 C)  SpO2: 93% 93%   Vitals:   03/17/20 1709 03/17/20 2048 03/18/20 0500 03/18/20 0513  BP: (!) 153/46 (!) 155/70  (!) 145/58  Pulse: 66 89  91  Resp: _0 Temp: 98.6 F (37 C) 98.4 F (36.9 C)  98.6 F (37 C)  TempSrc: Oral Oral  Oral  SpO2: 95% 93%  93%  Weight:   101.9 kg   Height:        General: Not in pain or dyspnea.  Neurology: Awake and alert, non focal  E ENT: mild pallor, no icterus, oral mucosa moist Cardiovascular: No JVD. S1-S2 present, rhythmic, no gallops,  rubs, or murmurs. No lower extremity edema. Pulmonary: positive breath sounds bilaterally, with no wheezing, rhonchi or rales. Gastrointestinal. Abdomen soft and non tender.  Skin. No rashes Musculoskeletal: no joint deformities   The results of significant diagnostics from this hospitalization (including imaging, microbiology, ancillary and laboratory) are listed below for reference.     Microbiology: Recent Results (from the past 240 hour(s))  Respiratory Panel by RT PCR (Flu A&B, Covid) -     Status: Abnormal   Collection Time: 03/09/20  3:41 PM   Specimen: Nasopharyngeal  Result Value Ref Range Status   SARS Coronavirus 2 by RT PCR POSITIVE (A) NEGATIVE Final    Comment: RESULT CALLED TO, READ BACK BY AND VERIFIED WITH: R.GROVES AT [facility] ON 03/09/20 BY N.THOMPSON (NOTE) SARS-CoV-2 target nucleic acids are DETECTED.  SARS-CoV-2 RNA is generally detectable in upper respiratory specimens  during the acute phase of infection. Positive results are indicative of the presence of the identified virus, but do not rule out bacterial infection or co-infection with other pathogens not detected by the test. Clinical correlation with patient history and other diagnostic information is necessary to determine patient infection status. The expected result is Negative.  Fact Sheet for Patients:  PinkCheek.be  Fact Sheet for Healthcare Providers: GravelBags.it  This test is not yet approved or cleared by the [place] FDA and  has been authorized for detection and/or diagnosis of SARS-CoV-2 by FDA under an Emergency Use Authorization (EUA).  This EUA will remain in effect (meaning this test  can be used) for the duration of  the COVID-19 declaration under Section 564(b)(1) of the Act, 21 U.S.C. section 360bbb-3(b)(1), unless the authorization is terminated or revoked sooner.  Influenza A by PCR NEGATIVE NEGATIVE Final    Influenza B by PCR NEGATIVE NEGATIVE Final    Comment: (NOTE) The Xpert Xpress SARS-CoV-2/FLU/RSV assay is intended as an aid in  the diagnosis of influenza from Nasopharyngeal swab specimens and  should not be used as a sole basis for treatment. Nasal washings and  aspirates are unacceptable for Xpert Xpress SARS-CoV-2/FLU/RSV  testing.  Fact Sheet for Patients: PinkCheek.be  Fact Sheet for Healthcare Providers: GravelBags.it  This test is not yet approved or cleared by the [place] FDA and  has been authorized for detection and/or diagnosis of SARS-CoV-2 by  FDA under an Emergency Use Authorization (EUA). This EUA will remain  in effect (meaning this test can be used) for the duration of the  Covid-19 declaration under Section 564(b)(1) of the Act, 21  U.S.C. section 360bbb-3(b)(1), unless the authorization is  terminated or revoked. Performed at [facility], [place] [address] [place], [place] [address]   Culture, Urine     Status: Abnormal   Collection Time: 03/09/20  3:41 PM   Specimen: Urine, Clean Catch  Result Value Ref Range Status   Specimen Description   Final    URINE, CLEAN CATCH Performed at [facility], [place] [address], [place], [place] [address]    Special Requests   Final    NONE Performed at [facility], [place] [address] [place], [place] [address]    Culture MULTIPLE SPECIES PRESENT, SUGGEST RECOLLECTION (A)  Final   Report Status 03/11/2020 FINAL  Final  Culture, blood (routine x 2)     Status: Abnormal   Collection Time: 03/13/20  2:53 PM   Specimen: BLOOD LEFT FOREARM  Result Value Ref Range Status   Specimen Description BLOOD LEFT FOREARM  Final   Special Requests   Final    BOTTLES DRAWN AEROBIC AND ANAEROBIC Blood Culture adequate volume   Culture  Setup Time   Final    GRAM POSITIVE COCCI IN CHAINS IN BOTH AEROBIC AND ANAEROBIC  BOTTLES CRITICAL RESULT CALLED TO, READ BACK BY AND VERIFIED WITH: E. WILLIAMSON,PHARMD 0600 03/14/2020 T. TYSOR    Culture STREPTOCOCCUS MITIS/ORALIS (A)  Final   Report Status 03/16/2020 FINAL  Final   Organism ID, Bacteria STREPTOCOCCUS MITIS/ORALIS  Final      Susceptibility   Streptococcus mitis/oralis - MIC*    TETRACYCLINE >=16 RESISTANT Resistant     VANCOMYCIN 0.5 SENSITIVE Sensitive     CLINDAMYCIN >=1 RESISTANT Resistant     PENICILLIN Value in next row Sensitive      SENSITIVE0.06    CEFTRIAXONE Value in next row Sensitive      SENSITIVE0.12Performed at [facility], [place] [address], [place], [place] [address]    * STREPTOCOCCUS MITIS/ORALIS  Blood Culture ID Panel (Reflexed)     Status: Abnormal   Collection Time: 03/13/20  2:53 PM  Result Value Ref Range Status   Enterococcus faecalis NOT DETECTED NOT DETECTED Final   Enterococcus Faecium NOT DETECTED NOT DETECTED Final   Listeria monocytogenes NOT DETECTED NOT DETECTED Final   Staphylococcus species NOT DETECTED NOT DETECTED Final   Staphylococcus aureus (BCID) NOT DETECTED NOT DETECTED Final   Staphylococcus epidermidis NOT DETECTED NOT DETECTED Final   Staphylococcus lugdunensis NOT DETECTED NOT DETECTED Final   Streptococcus species DETECTED (A) NOT DETECTED Final    Comment: Not Enterococcus species, Streptococcus agalactiae, Streptococcus pyogenes, or Streptococcus pneumoniae. CRITICAL RESULT CALLED TO, READ BACK BY AND VERIFIED  WITH: E. WILLIAMSON,PHARMD 0600 03/14/2020 T. TYSOR    Streptococcus agalactiae NOT DETECTED NOT DETECTED Final   Streptococcus pneumoniae NOT DETECTED NOT DETECTED Final   Streptococcus pyogenes NOT DETECTED NOT DETECTED Final   A.calcoaceticus-baumannii NOT DETECTED NOT DETECTED Final   Bacteroides fragilis NOT DETECTED NOT DETECTED Final   Enterobacterales NOT DETECTED NOT DETECTED Final   Enterobacter cloacae complex NOT DETECTED NOT DETECTED Final   Escherichia coli NOT  DETECTED NOT DETECTED Final   Klebsiella aerogenes NOT DETECTED NOT DETECTED Final   Klebsiella oxytoca NOT DETECTED NOT DETECTED Final   Klebsiella pneumoniae NOT DETECTED NOT DETECTED Final   Proteus species NOT DETECTED NOT DETECTED Final   Salmonella species NOT DETECTED NOT DETECTED Final   Serratia marcescens NOT DETECTED NOT DETECTED Final   Haemophilus influenzae NOT DETECTED NOT DETECTED Final   Neisseria meningitidis NOT DETECTED NOT DETECTED Final   Pseudomonas aeruginosa NOT DETECTED NOT DETECTED Final   Stenotrophomonas maltophilia NOT DETECTED NOT DETECTED Final   Candida albicans NOT DETECTED NOT DETECTED Final   Candida auris NOT DETECTED NOT DETECTED Final   Candida glabrata NOT DETECTED NOT DETECTED Final   Candida krusei NOT DETECTED NOT DETECTED Final   Candida parapsilosis NOT DETECTED NOT DETECTED Final   Candida tropicalis NOT DETECTED NOT DETECTED Final   Cryptococcus neoformans/gattii NOT DETECTED NOT DETECTED Final    Comment: Performed at [facility], [address] [place], [place] [address]  Culture, Urine     Status: Abnormal   Collection Time: 03/13/20  8:00 PM   Specimen: Urine, Random  Result Value Ref Range Status   Specimen Description   Final    URINE, RANDOM Performed at [place] [address] [place], [place] [address]    Special Requests   Final    NONE Performed at [facility], [place] [address], [place], [place] [address]    Culture MULTIPLE SPECIES PRESENT, SUGGEST RECOLLECTION (A)  Final   Report Status 03/15/2020 FINAL  Final  Culture, blood (routine x 2)     Status: Abnormal   Collection Time: 03/14/20  4:28 AM   Specimen: BLOOD LEFT HAND  Result Value Ref Range Status   Specimen Description BLOOD LEFT HAND  Final   Special Requests   Final    BOTTLES DRAWN AEROBIC ONLY Blood Culture results may not be optimal due to an inadequate volume of blood received in culture bottles    Culture  Setup Time   Final    AEROBIC BOTTLE ONLY GRAM POSITIVE COCCI IN CHAINS CRITICAL VALUE NOTED.  VALUE IS CONSISTENT WITH PREVIOUSLY REPORTED AND CALLED VALUE.    Culture (A)  Final    STREPTOCOCCUS MITIS/ORALIS SUSCEPTIBILITIES PERFORMED ON PREVIOUS CULTURE WITHIN THE LAST 5 DAYS. Performed at [facility], [place] [address] [place], [place] [address]    Report Status 03/16/2020 FINAL  Final  Culture, blood (routine x 2)     Status: Abnormal   Collection Time: 03/14/20  5:57 AM   Specimen: BLOOD  Result Value Ref Range Status   Specimen Description   Final    BLOOD LEFT ARM Performed at [place] [address] [place], [place] [address]    Special Requests   Final    BOTTLES DRAWN AEROBIC ONLY Performed at [place] [address], [place], [place] [address]    Culture  Setup Time   Final    AEROBIC BOTTLE ONLY [place] Grandchild  POSITIVE COCCI CRITICAL VALUE NOTED.  VALUE IS CONSISTENT WITH PREVIOUSLY REPORTED AND CALLED VALUE.    Culture (A)  Final    STREPTOCOCCUS MITIS/ORALIS SUSCEPTIBILITIES PERFORMED ON PREVIOUS CULTURE WITHIN THE LAST 5 DAYS. Performed at [facility], [place] [address] [place], [place] [address]    Report Status 03/16/2020 FINAL  Final  Culture, blood (routine x 2)     Status: None (Preliminary result)   Collection Time: 03/16/20  5:12 PM   Specimen: BLOOD LEFT HAND  Result Value Ref Range Status   Specimen Description   Final    BLOOD LEFT HAND Performed at [place] [address], [place], [place] [address]    Special Requests   Final    BOTTLES DRAWN AEROBIC AND ANAEROBIC Blood Culture adequate volume   Culture   Final    NO GROWTH < 24 HOURS Performed at [facility], [place] [address], [place], [place] [address]    Report Status PENDING  Incomplete  Culture, blood (routine x 2)     Status: None (Preliminary result)   Collection Time: 03/16/20  5:19 PM   Specimen:  BLOOD LEFT HAND  Result Value Ref Range Status   Specimen Description   Final    BLOOD LEFT HAND Performed at [place] [address] [place], [place] [address]    Special Requests   Final    BOTTLES DRAWN AEROBIC AND ANAEROBIC Blood Culture adequate volume   Culture   Final    NO GROWTH < 24 HOURS Performed at [facility], [place] [address], [place], [place] [address]    Report Status PENDING  Incomplete     Labs: BNP (last 3 results) Recent Labs    03/10/20 1700 03/14/20 0333  BNP 93.0 242.6*   Basic Metabolic Panel: Recent Labs  Lab 03/13/20 0615 03/14/20 0333 03/15/20 0355 03/17/20 0319  NA 135 136 133* 134*  K 4.5 3.7 4.1 4.1  CL 102 100 101 102  CO2 _0 GLUCOSE 128* 125* 153* 130*  BUN _1 CREATININE 0.56 0.52 0.54 0.51  CALCIUM 7.7* 7.8* 7.8* 7.6*  MG 1.8 1.8 1.8  --   PHOS 3.8 3.7 3.0  --    Liver Function Tests: Recent Labs  Lab 03/13/20 0615 03/14/20 0333 03/15/20 0355 03/17/20 0319  AST 81* 77* 86* 75*  ALT 38 40 41 37  ALKPHOS 114 127* 139* 136*  BILITOT 1.0 0.8 0.9 0.9  PROT 5.7* 5.9* 6.4* 6.1*  ALBUMIN 2.0* 2.2* 2.3* 2.1*   No results for input(s): LIPASE, AMYLASE in the last 168 hours. No results for input(s): AMMONIA in the last 168 hours. CBC: Recent Labs  Lab 03/13/20 0615 03/14/20 0333 03/15/20 0355 03/17/20 0439  WBC 4.1 4.1 5.0 4.5  NEUTROABS 3.0 2.8 4.1  --   HGB 10.5* 10.2* 11.2* 10.2*  HCT 32.0* 31.2* 34.2* 30.9*  MCV 108.1* 107.2* 106.5* 106.2*  PLT 97* 100* 99* 112*   Cardiac Enzymes: No results for input(s): CKTOTAL, CKMB, CKMBINDEX, TROPONINI in the last 168 hours. BNP: Invalid input(s): POCBNP CBG: No results for input(s): GLUCAP in the last 168 hours. D-Dimer No results for input(s): DDIMER in the last 72 hours. Hgb A1c No results for input(s): HGBA1C in the last 72 hours. Lipid Profile No results for input(s): CHOL, HDL, LDLCALC, TRIG, CHOLHDL, LDLDIRECT in  the last 72 hours. Thyroid function studies No results for input(s):  TSH, T4TOTAL, T3FREE, THYROIDAB in the last 72 hours.  Invalid input(s): FREET3 Anemia work up No results for input(s): VITAMINB12, FOLATE, FERRITIN, TIBC, IRON, RETICCTPCT in the last 72 hours. Urinalysis    Component Value Date/Time   COLORURINE YELLOW 03/13/2020 2000   APPEARANCEUR HAZY (A) 03/13/2020 2000   LABSPEC 1.008 03/13/2020 2000   PHURINE 7.0 03/13/2020 2000   GLUCOSEU NEGATIVE 03/13/2020 2000   GLUCOSEU NEGATIVE 01/25/2010 0858   HGBUR MODERATE (A) 03/13/2020 2000   HGBUR 1+ 12/30/2008 0838   BILIRUBINUR NEGATIVE 03/13/2020 2000   BILIRUBINUR Neg 04/28/2015 1122   KETONESUR NEGATIVE 03/13/2020 2000   PROTEINUR NEGATIVE 03/13/2020 2000   UROBILINOGEN 0.2 04/28/2015 1122   UROBILINOGEN 0.2 01/25/2010 0858   NITRITE NEGATIVE 03/13/2020 2000   LEUKOCYTESUR TRACE (A) 03/13/2020 2000   Sepsis Labs Invalid input(s): PROCALCITONIN,  WBC,  LACTICIDVEN Microbiology Recent Results (from the past 240 hour(s))  Respiratory Panel by RT PCR (Flu A&B, Covid) -     Status: Abnormal   Collection Time: 03/09/20  3:41 PM   Specimen: Nasopharyngeal  Result Value Ref Range Status   SARS Coronavirus 2 by RT PCR POSITIVE (A) NEGATIVE Final    Comment: RESULT CALLED TO, READ BACK BY AND VERIFIED WITH: R.GROVES AT [place] ON 03/09/20 BY N.THOMPSON (NOTE) SARS-CoV-2 target nucleic acids are DETECTED.  SARS-CoV-2 RNA is generally detectable in upper respiratory specimens  during the acute phase of infection. Positive results are indicative of the presence of the identified virus, but do not rule out bacterial infection or co-infection with other pathogens not detected by the test. Clinical correlation with patient history and other diagnostic information is necessary to determine patient infection status. The expected result is Negative.  Fact Sheet for Patients:  PinkCheek.be  Fact  Sheet for Healthcare Providers: GravelBags.it  This test is not yet approved or cleared by the [place] FDA and  has been authorized for detection and/or diagnosis of SARS-CoV-2 by FDA under an Emergency Use Authorization (EUA).  This EUA will remain in effect (meaning this test  can be used) for the duration of  the COVID-19 declaration under Section 564(b)(1) of the Act, 21 U.S.C. section 360bbb-3(b)(1), unless the authorization is terminated or revoked sooner.      Influenza A by PCR NEGATIVE NEGATIVE Final   Influenza B by PCR NEGATIVE NEGATIVE Final    Comment: (NOTE) The Xpert Xpress SARS-CoV-2/FLU/RSV assay is intended as an aid in  the diagnosis of influenza from Nasopharyngeal swab specimens and  should not be used as a sole basis for treatment. Nasal washings and  aspirates are unacceptable for Xpert Xpress SARS-CoV-2/FLU/RSV  testing.  Fact Sheet for Patients: PinkCheek.be  Fact Sheet for Healthcare Providers: GravelBags.it  This test is not yet approved or cleared by the [place] FDA and  has been authorized for detection and/or diagnosis of SARS-CoV-2 by  FDA under an Emergency Use Authorization (EUA). This EUA will remain  in effect (meaning this test can be used) for the duration of the  Covid-19 declaration under Section 564(b)(1) of the Act, 21  U.S.C. section 360bbb-3(b)(1), unless the authorization is  terminated or revoked. Performed at [facility], [place] [address], [place], [place] [address]   Culture, Urine     Status: Abnormal   Collection Time: 03/09/20  3:41 PM   Specimen: Urine, Clean Catch  Result Value Ref Range Status   Specimen Description   Final    URINE, CLEAN CATCH Performed at [place] [address], [place], [place] [address]    Special Requests   Final    NONE Performed at [facility], [place] [address] [place], [place] [address]    Culture MULTIPLE SPECIES PRESENT, SUGGEST RECOLLECTION (A)  Final   Report Status 03/11/2020 FINAL  Final  Culture, blood (routine x 2)     Status: Abnormal   Collection Time: 03/13/20  2:53 PM   Specimen: BLOOD LEFT FOREARM  Result Value Ref Range Status   Specimen Description BLOOD LEFT FOREARM  Final   Special Requests   Final    BOTTLES DRAWN AEROBIC AND ANAEROBIC Blood Culture adequate volume   Culture  Setup Time   Final    GRAM POSITIVE COCCI IN CHAINS IN BOTH AEROBIC AND ANAEROBIC BOTTLES CRITICAL RESULT CALLED TO, READ BACK BY AND VERIFIED WITH: E. WILLIAMSON,PHARMD 0600 03/14/2020 T. TYSOR    Culture STREPTOCOCCUS MITIS/ORALIS (A)  Final   Report Status 03/16/2020 FINAL  Final   Organism ID, Bacteria STREPTOCOCCUS MITIS/ORALIS  Final      Susceptibility   Streptococcus mitis/oralis - MIC*    TETRACYCLINE >=16 RESISTANT Resistant     VANCOMYCIN 0.5 SENSITIVE Sensitive     CLINDAMYCIN >=1 RESISTANT Resistant     PENICILLIN Value in next row Sensitive      SENSITIVE0.06    CEFTRIAXONE Value in next row Sensitive      SENSITIVE0.12Performed at [facility], [place] [address], [place], [place] [address]    * STREPTOCOCCUS MITIS/ORALIS  Blood Culture ID Panel (Reflexed)     Status: Abnormal   Collection Time: 03/13/20  2:53 PM  Result Value Ref Range Status   Enterococcus faecalis NOT DETECTED NOT DETECTED Final   Enterococcus Faecium NOT DETECTED NOT DETECTED Final   Listeria monocytogenes NOT DETECTED NOT DETECTED Final   Staphylococcus species NOT DETECTED NOT DETECTED Final   Staphylococcus aureus (BCID) NOT DETECTED NOT DETECTED Final   Staphylococcus epidermidis NOT DETECTED NOT DETECTED Final   Staphylococcus lugdunensis NOT DETECTED NOT DETECTED Final   Streptococcus species DETECTED (A) NOT DETECTED Final    Comment: Not Enterococcus species, Streptococcus agalactiae, Streptococcus pyogenes, or  Streptococcus pneumoniae. CRITICAL RESULT CALLED TO, READ BACK BY AND VERIFIED WITH: E. WILLIAMSON,PHARMD 0600 03/14/2020 T. TYSOR    Streptococcus agalactiae NOT DETECTED NOT DETECTED Final   Streptococcus pneumoniae NOT DETECTED NOT DETECTED Final   Streptococcus pyogenes NOT DETECTED NOT DETECTED Final   A.calcoaceticus-baumannii NOT DETECTED NOT DETECTED Final   Bacteroides fragilis NOT DETECTED NOT DETECTED Final   Enterobacterales NOT DETECTED NOT DETECTED Final   Enterobacter cloacae complex NOT DETECTED NOT DETECTED Final   Escherichia coli NOT DETECTED NOT DETECTED Final   Klebsiella aerogenes NOT DETECTED NOT DETECTED Final   Klebsiella oxytoca NOT DETECTED NOT DETECTED Final   Klebsiella pneumoniae NOT DETECTED NOT DETECTED Final   Proteus species NOT DETECTED NOT DETECTED Final   Salmonella species NOT DETECTED NOT DETECTED Final   Serratia marcescens NOT DETECTED NOT DETECTED Final   Haemophilus influenzae NOT DETECTED NOT DETECTED Final   Neisseria meningitidis NOT DETECTED NOT DETECTED Final   Pseudomonas aeruginosa NOT DETECTED NOT DETECTED Final   Stenotrophomonas maltophilia NOT DETECTED NOT DETECTED Final   Candida albicans NOT DETECTED NOT DETECTED Final   Candida auris NOT DETECTED NOT DETECTED Final   Candida glabrata NOT DETECTED NOT DETECTED Final   Candida krusei NOT DETECTED NOT DETECTED Final   Candida parapsilosis NOT DETECTED NOT  DETECTED Final   Candida tropicalis NOT DETECTED NOT DETECTED Final   Cryptococcus neoformans/gattii NOT DETECTED NOT DETECTED Final    Comment: Performed at [facility], [place] [address] [place], [place] [address]  Culture, Urine     Status: Abnormal   Collection Time: 03/13/20  8:00 PM   Specimen: Urine, Random  Result Value Ref Range Status   Specimen Description   Final    URINE, RANDOM Performed at [place] [address] [place], [place] [address]    Special Requests   Final     NONE Performed at [facility], [place] [address], [place], [place] [address]    Culture MULTIPLE SPECIES PRESENT, SUGGEST RECOLLECTION (A)  Final   Report Status 03/15/2020 FINAL  Final  Culture, blood (routine x 2)     Status: Abnormal   Collection Time: 03/14/20  4:28 AM   Specimen: BLOOD LEFT HAND  Result Value Ref Range Status   Specimen Description BLOOD LEFT HAND  Final   Special Requests   Final    BOTTLES DRAWN AEROBIC ONLY Blood Culture results may not be optimal due to an inadequate volume of blood received in culture bottles   Culture  Setup Time   Final    AEROBIC BOTTLE ONLY GRAM POSITIVE COCCI IN CHAINS CRITICAL VALUE NOTED.  VALUE IS CONSISTENT WITH PREVIOUSLY REPORTED AND CALLED VALUE.    Culture (A)  Final    STREPTOCOCCUS MITIS/ORALIS SUSCEPTIBILITIES PERFORMED ON PREVIOUS CULTURE WITHIN THE LAST 5 DAYS. Performed at [facility], [place] [address] [place], [place] [address]    Report Status 03/16/2020 FINAL  Final  Culture, blood (routine x 2)     Status: Abnormal   Collection Time: 03/14/20  5:57 AM   Specimen: BLOOD  Result Value Ref Range Status   Specimen Description   Final    BLOOD LEFT ARM Performed at [place] [address], [place], [place] [address]    Special Requests   Final    BOTTLES DRAWN AEROBIC ONLY Performed at [place] [address] [place], [place] [address]    Culture  Setup Time   Final    AEROBIC BOTTLE ONLY GRAM POSITIVE COCCI CRITICAL VALUE NOTED.  VALUE IS CONSISTENT WITH PREVIOUSLY REPORTED AND CALLED VALUE.    Culture (A)  Final    STREPTOCOCCUS MITIS/ORALIS SUSCEPTIBILITIES PERFORMED ON PREVIOUS CULTURE WITHIN THE LAST 5 DAYS. Performed at [facility], [place] [address], [place], [place] [address]    Report Status 03/16/2020 FINAL  Final  Culture, blood (routine x 2)     Status: None (Preliminary result)   Collection Time: 03/16/20  5:12 PM    Specimen: BLOOD LEFT HAND  Result Value Ref Range Status   Specimen Description   Final    BLOOD LEFT HAND Performed at [place] [address], [place], [place] [address]    Special Requests   Final    BOTTLES DRAWN AEROBIC AND ANAEROBIC Blood Culture adequate volume   Culture   Final    NO GROWTH < 24 HOURS Performed at [facility], [place] [name] [place],  [address]    Report Status PENDING  Incomplete  Culture, blood (routine x 2)     Status: None (Preliminary result)   Collection Time: 03/16/20  5:19 PM   Specimen: BLOOD LEFT HAND  Result Value Ref Range Status   Specimen Description  Final    BLOOD LEFT HAND Performed at [place], [place] [address] [place], [place] [address]    Special Requests   Final    BOTTLES DRAWN AEROBIC AND ANAEROBIC Blood Culture adequate volume   Culture   Final    NO GROWTH < 24 HOURS Performed at [facility], [place] [address] [place], [place] [address]    Report Status PENDING  Incomplete     Time coordinating discharge: 45 minutes  SIGNED:   Tawni Millers, MD  [facility] 03/18/2020, 10:54 AM

## 2020-03-18 NOTE — Progress Notes (Signed)
Discharge for today was cancelled, due to outpatient antibiotic arrangements.  Plan is for discharge home in am, if continue to be stable.

## 2020-03-18 NOTE — Progress Notes (Signed)
COURTESY NOTE: Developments this admission noted. Breast cancer is not a critical concern at this point. The patient's anastrozole may be resumed at d/c-- it does not affect coagulation as c/w placebo.  I am providing a summary of her breast cancer history below. Please let me know if I can be of any help. Agree with palliative care consult.  Breast cancer summary:  74 y.o. 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 Results are negative for pathogenic mutations in 46 genes analyzed by [facility]'s Common Hereditary Cancers Panel. Results are dated 11/26/2016. Genes tested: APC, ATM, AXIN2, BARD1, BMPR1A, BRCA1, BRCA2, BRIP1, CDH1, CDKN2A, CHEK2, CTNNA1, DICER1, EPCAM, GREM1, HOXB13, KIT, MEN1, MLH1, MSH2, MSH3, MSH6, MUTYH, NBN, NF1, NTHL1, PALB2, PDGFRA, PMS2, POLD1, POLE, PTEN, RAD50, RAD51C, RAD51D, SDHA, SDHB, SDHC, SDHD, SMAD4, SMARCA4, STK11, TP53, TSC1, TSC2, and VHL.            (a) A variant of uncertain significance (not clinically actionable) was noted in CTNNA1.    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 There was a 4 cm right breast mass with right axillary lymph nodes and evidence of cirrhosis.  (3) neoadjuvant treatment consisting of cyclophosphamide, methotrexate, and fluorouracil (CMF) chemotherapy every 21 days 6, starting 11/05/2016, last dose 02/18/2017  (4) Bilateral mastectomies on 04/04/2017  left breast: DCIS, 1.2 cm, margins negative, right Breast: IMC predominantly lobular, grade 2, 8 cm, Posterior margin focally positive, 10/10 lymph noes involved.  Both are ER/PR positive, invasive tumor was HER-2 negative (ratio 1.48).             (a) the patient is status post revision of some of the scar irregularities under Dr. Marla Roe 10/28/2018  (5) adjuvant radiation from 06/02/2017-07/10/2017: 1) Right chest wall/ 50.4 Gy in 28 fractions.  2) L-spine/ 30 Gy in 10 fractions  (6) anastrozole started 09/2017             (a) bone density 12/18/2017 showed a T score of -0.7 normal (although scan quality was limited and lumbar spine was not utilized due to advanced degenerative changes. 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

## 2020-03-19 DIAGNOSIS — A408 Other streptococcal sepsis: Secondary | ICD-10-CM | POA: Diagnosis not present

## 2020-03-19 DIAGNOSIS — E860 Dehydration: Secondary | ICD-10-CM | POA: Diagnosis not present

## 2020-03-19 DIAGNOSIS — A419 Sepsis, unspecified organism: Secondary | ICD-10-CM | POA: Diagnosis not present

## 2020-03-19 DIAGNOSIS — D630 Anemia in neoplastic disease: Secondary | ICD-10-CM | POA: Diagnosis not present

## 2020-03-19 DIAGNOSIS — M479 Spondylosis, unspecified: Secondary | ICD-10-CM | POA: Diagnosis not present

## 2020-03-19 DIAGNOSIS — E039 Hypothyroidism, unspecified: Secondary | ICD-10-CM | POA: Diagnosis not present

## 2020-03-19 DIAGNOSIS — Z452 Encounter for adjustment and management of vascular access device: Secondary | ICD-10-CM | POA: Diagnosis not present

## 2020-03-19 DIAGNOSIS — U071 COVID-19: Secondary | ICD-10-CM | POA: Diagnosis not present

## 2020-03-19 DIAGNOSIS — Z9013 Acquired absence of bilateral breasts and nipples: Secondary | ICD-10-CM | POA: Diagnosis not present

## 2020-03-19 DIAGNOSIS — E871 Hypo-osmolality and hyponatremia: Secondary | ICD-10-CM | POA: Diagnosis not present

## 2020-03-19 DIAGNOSIS — Z7982 Long term (current) use of aspirin: Secondary | ICD-10-CM | POA: Diagnosis not present

## 2020-03-19 DIAGNOSIS — Z79899 Other long term (current) drug therapy: Secondary | ICD-10-CM | POA: Diagnosis not present

## 2020-03-19 DIAGNOSIS — G4733 Obstructive sleep apnea (adult) (pediatric): Secondary | ICD-10-CM | POA: Diagnosis not present

## 2020-03-19 DIAGNOSIS — C7951 Secondary malignant neoplasm of bone: Secondary | ICD-10-CM | POA: Diagnosis not present

## 2020-03-19 DIAGNOSIS — I080 Rheumatic disorders of both mitral and aortic valves: Secondary | ICD-10-CM | POA: Diagnosis not present

## 2020-03-19 DIAGNOSIS — Z86010 Personal history of colonic polyps: Secondary | ICD-10-CM | POA: Diagnosis not present

## 2020-03-19 DIAGNOSIS — D696 Thrombocytopenia, unspecified: Secondary | ICD-10-CM | POA: Diagnosis not present

## 2020-03-19 DIAGNOSIS — I119 Hypertensive heart disease without heart failure: Secondary | ICD-10-CM | POA: Diagnosis not present

## 2020-03-19 DIAGNOSIS — G319 Degenerative disease of nervous system, unspecified: Secondary | ICD-10-CM | POA: Diagnosis not present

## 2020-03-19 DIAGNOSIS — Z170 Estrogen receptor positive status [ER+]: Secondary | ICD-10-CM | POA: Diagnosis not present

## 2020-03-19 DIAGNOSIS — C50811 Malignant neoplasm of overlapping sites of right female breast: Secondary | ICD-10-CM | POA: Diagnosis not present

## 2020-03-19 DIAGNOSIS — Z8673 Personal history of transient ischemic attack (TIA), and cerebral infarction without residual deficits: Secondary | ICD-10-CM | POA: Diagnosis not present

## 2020-03-19 DIAGNOSIS — J323 Chronic sphenoidal sinusitis: Secondary | ICD-10-CM | POA: Diagnosis not present

## 2020-03-19 NOTE — Plan of Care (Signed)
  Problem: Coping: Goal: Will identify appropriate support needs Outcome: Progressing   Problem: Self-Care: Goal: Verbalization of feelings and concerns over difficulty with self-care will improve Outcome: Progressing Goal: Ability to communicate needs accurately will improve Outcome: Progressing   Problem: Education: Goal: Knowledge of disease or condition will improve Outcome: Progressing Goal: Knowledge of secondary prevention will improve Outcome: Progressing Goal: Knowledge of patient specific risk factors addressed and post discharge goals established will improve Outcome: Progressing Goal: Individualized Educational Video(s) Outcome: Progressing   Problem: Coping: Goal: Will verbalize positive feelings about self Outcome: Progressing Goal: Will identify appropriate support needs Outcome: Progressing   Problem: Health Behavior/Discharge Planning: Goal: Ability to manage health-related needs will improve Outcome: Progressing   Problem: Self-Care: Goal: Ability to participate in self-care as condition permits will improve Outcome: Progressing Goal: Verbalization of feelings and concerns over difficulty with self-care will improve Outcome: Progressing Goal: Ability to communicate needs accurately will improve Outcome: Progressing   Problem: Nutrition: Goal: Risk of aspiration will decrease Outcome: Progressing Goal: Dietary intake will improve Outcome: Progressing   Problem: Intracerebral Hemorrhage Tissue Perfusion: Goal: Complications of Intracerebral Hemorrhage will be minimized Outcome: Progressing   Problem: Ischemic Stroke/TIA Tissue Perfusion: Goal: Complications of ischemic stroke/TIA will be minimized Outcome: Progressing   Problem: Spontaneous Subarachnoid Hemorrhage Tissue Perfusion: Goal: Complications of Spontaneous Subarachnoid Hemorrhage will be minimized Outcome: Progressing   Problem: Education: Goal: Knowledge of General Education  information will improve Description: Including pain rating scale, medication(s)/side effects and non-pharmacologic comfort measures Outcome: Progressing   Problem: Health Behavior/Discharge Planning: Goal: Ability to manage health-related needs will improve Outcome: Progressing   Problem: Clinical Measurements: Goal: Ability to maintain clinical measurements within normal limits will improve Outcome: Progressing Goal: Will remain free from infection Outcome: Progressing Goal: Diagnostic test results will improve Outcome: Progressing Goal: Respiratory complications will improve Outcome: Progressing Goal: Cardiovascular complication will be avoided Outcome: Progressing   Problem: Activity: Goal: Risk for activity intolerance will decrease Outcome: Progressing   Problem: Nutrition: Goal: Adequate nutrition will be maintained Outcome: Progressing   Problem: Coping: Goal: Level of anxiety will decrease Outcome: Progressing   Problem: Elimination: Goal: Will not experience complications related to bowel motility Outcome: Progressing Goal: Will not experience complications related to urinary retention Outcome: Progressing   Problem: Pain Managment: Goal: General experience of comfort will improve Outcome: Progressing   Problem: Safety: Goal: Ability to remain free from injury will improve Outcome: Progressing   Problem: Skin Integrity: Goal: Risk for impaired skin integrity will decrease Outcome: Progressing

## 2020-03-19 NOTE — Progress Notes (Signed)
COURTESY NOTE: Note plans for discharge 03/20/2020. Patient has outpatient appointment with me in place for December. I will add a virtual visit in about 2 weeks. 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  Thank you for your excellent care of this patient!     Breast cancer summary:  74 y.o. 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 Results are negative for pathogenic mutations in 46 genes analyzed by [facility] Common Hereditary Cancers Panel. Results are dated 11/26/2016. Genes tested: APC, ATM, AXIN2, BARD1, BMPR1A, BRCA1, BRCA2, BRIP1, CDH1, CDKN2A, CHEK2, CTNNA1, DICER1, EPCAM, GREM1, HOXB13, KIT, MEN1, MLH1, MSH2, MSH3, MSH6, MUTYH, NBN, NF1, NTHL1, PALB2, PDGFRA, PMS2, POLD1, POLE, PTEN, RAD50, RAD51C, RAD51D, SDHA, SDHB, SDHC, SDHD, SMAD4, SMARCA4, STK11, TP53, TSC1, TSC2, and VHL.            (a) A variant of uncertain significance (not clinically actionable) was noted in CTNNA1.    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 There was a 4 cm right breast mass with right axillary lymph nodes and evidence of cirrhosis.  (3) neoadjuvant treatment consisting of cyclophosphamide, methotrexate, and fluorouracil (CMF) chemotherapy every 21 days 6, starting 11/05/2016, last dose 02/18/2017  (4) Bilateral mastectomies on 04/04/2017 left breast: DCIS, 1.2 cm,  margins negative, right Breast: IMC predominantly lobular, grade 2, 8 cm, Posterior margin focally positive, 10/10 lymph noes involved.  Both are ER/PR positive, invasive tumor was HER-2 negative (ratio 1.48).             (a) the patient is status post revision of some of the scar irregularities under Dr. Marla Roe 10/28/2018  (5) adjuvant radiation from 06/02/2017-07/10/2017: 1) Right chest wall/ 50.4 Gy in 28 fractions.  2) L-spine/ 30 Gy in 10 fractions  (6) anastrozole started 09/2017             (a) bone density 12/18/2017 showed a T score of -0.7 normal (although scan quality was limited and lumbar spine was not utilized due to advanced degenerative changes. 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

## 2020-03-19 NOTE — TOC Progression Note (Signed)
Transition of Care [facility]) - Progression Note    Patient Details  Name: Jasmine Hood MRN: [id number] Date of Birth: [birth year]  [facility] CM/SW Contact  Joaquin Courts, RN Phone Number: 03/19/2020, 10:26 AM  Clinical Narrative:    [facility] transport arranged.   Expected Discharge Plan: [place] Barriers to Discharge: No Barriers Identified  Expected Discharge Plan and Services Expected Discharge Plan: [place]   Discharge Planning Services: CM Consult Post Acute Care Choice: [place] arrangements for the past 2 months: Single Family Home Expected Discharge Date: 03/19/20                                     Social Determinants of Health (SDOH) Interventions    Readmission Risk Interventions No flowsheet data found.

## 2020-03-19 NOTE — Discharge Instructions (Signed)
Bacteremia, Adult Bacteremia is the presence of bacteria in the blood. When bacteria enter the bloodstream, they can cause a life-threatening reaction called sepsis. Sepsis is a medical emergency. What are the causes? This condition is caused by bacteria that get into the blood. Bacteria can enter the blood from an infection, including:  A skin infection or injury, such as a burn or a cut.  A lung infection (pneumonia).  An infection in the stomach or intestines.  An infection in the bladder or urinary system (urinary tract infection).  A bacterial infection in another part of the body that spreads to the blood. Bacteria can also enter the blood during a dental or medical procedure, from bleeding gums, or through use of an unclean needle. What increases the risk? This condition is more likely to develop in children, older adults, and people who have:  A long-term (chronic) disease or condition like diabetes or chronic kidney failure.  An artificial joint or heart valve, or heart valve disease.  A tube inserted to treat a medical condition, such as a urinary catheter or IV.  A weak disease-fighting system (immune system).  Injected illegal drugs.  Been hospitalized for more than 10 days in a row. What are the signs or symptoms? Symptoms of this condition include:  Fever and chills.  Fast heartbeat and shortness of breath.  Dizziness, weakness, and low blood pressure.  Confusion or anxiety.  Pain in the abdomen, nausea, vomiting, and diarrhea. Bacteremia that has spread to other parts of the body may cause symptoms in those areas. In some cases, there are no symptoms. How is this diagnosed? This condition may be diagnosed with a physical exam and tests, such as:  Blood tests to check for bacteria (cultures) or other signs of infection.  Tests of any tubes that you have had inserted. These tests check for a source of infection.  Urine tests to check for bacteria in the  urine.  Imaging tests, such as an X-ray, a CT scan, an MRI, or a heart ultrasound. These check for a source of infection in other parts of your body, such as your lungs, heart valves, or joints. How is this treated? This condition is usually treated in the hospital. If you are treated at home, you may need to return to the hospital for medicines, blood tests, and evaluation. Treatment may include:  Antibiotic medicines. These may be given by mouth or directly into your blood through an IV. You may need antibiotics for several weeks. At first, you may be given an antibiotic to kill most types of blood bacteria. If tests show that a certain kind of bacteria is causing the problem, you may be given a different antibiotic.  IV fluids.  Removing any catheter or device that could be a source of infection.  Blood pressure and breathing support, if needed.  Surgery to control the source or the spread of infection, such as surgery to remove an implanted device, abscess, or infected tissue. Follow these instructions at home: Medicines  Take over-the-counter and prescription medicines only as told by your health care provider.  If you were prescribed an antibiotic medicine, take it as told by your health care provider. Do not stop taking the antibiotic even if you start to feel better. General instructions   Rest as needed. Ask your health care provider when you may return to normal activities.  Drink enough fluid to keep your urine pale yellow.  Do not use any products that contain nicotine or   tobacco, such as cigarettes, e-cigarettes, and chewing tobacco. If you need help quitting, ask your health care provider.  Keep all follow-up visits as told by your health care provider. This is important. How is this prevented?   Wash your hands regularly with soap and water. If soap and water are not available, use hand sanitizer.  You should wash your hands: ? After using the toilet or changing a  diaper. ? Before preparing, cooking, serving, or eating food. ? While caring for a sick person or while visiting someone in a hospital. ? Before and after changing bandages (dressings) over wounds.  Clean any scrapes or cuts with soap and water and cover them with a clean bandage.  Get vaccinations as recommended by your health care provider.  Practice good oral hygiene. Brush your teeth two times a day, and floss regularly.  Take good care of your skin. This includes bathing and moisturizing on a regular basis. Contact a health care provider if:  Your symptoms get worse, and medicines do not help.  You have severe pain. Get help right away if you have:  Pain.  A fever or chills.  Trouble breathing.  A fast heart rate.  Skin that is blotchy, pale, or clammy.  Confusion.  Weakness.  Lack of energy or unusual sleepiness.  New symptoms that develop after treatment has started. These symptoms may represent a serious problem that is an emergency. Do not wait to see if the symptoms will go away. Get medical help right away. Call your local emergency services (911 in the [place] Do not drive yourself to the hospital. Summary  Bacteremia is the presence of bacteria in the blood. When bacteria enter the bloodstream, they can cause a life-threatening reaction called sepsis.  Bacteremia is usually treated with antibiotic medicines in the hospital.  If you were prescribed an antibiotic medicine, take it as told by your health care provider. Do not stop taking the antibiotic even if you start to feel better.  Get help right away if you have any new symptoms that develop after treatment has started. This information is not intended to replace advice given to you by your health care provider. Make sure you discuss any questions you have with your health care provider. Document Revised: 09/25/2018 Document Reviewed: 09/25/2018 [employer] Patient Education  2020 [employer]  

## 2020-03-19 NOTE — Discharge Summary (Addendum)
Physician Discharge Summary  Jasmine Hood LEX:[id number] DOB: [birth year] DOA: 03/09/2020  PCP: Jasmine Hood, Jasmine Halsted, MD  Admit date: 03/09/2020 Discharge date: 03/19/2020  Admitted From: home Disposition:  home  Recommendations for Outpatient Follow-up:  1. Follow up with PCP in 1 weeks 2. Continue antibiotic therapy until 04/13/20, continuous infusion of Penicillin.   Duration: 4 wk End Date:04/13/2020 PIC Care Per Protocol: Home health RN for IV administration and teaching; PICC line care and labs.  Labs weekly while on IV antibiotics: CBC with differential and BMP  Please pull PIC at completion of IV antibiotics Fax weekly labs to [phone]  Follow up with Jasmine Hood in 3 to 4 weeks.   3. Added pantoprazole.  4. Follow up with Jasmine Hood as outpatient, she will benefit from palliative care.    Home Health: Yes Equipment/Devices:Home Abx   Discharge Condition: Stable CODE STATUS: DNR Diet recommendation: heart healthy  Brief/Interim Summary: Jasmine Hood was admitted to the hospital due to streptococcal bacteremia in the setting of a positive SARS COVID-19 infection. Complicated with brain septic emboli.   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 She reports poor oral intake, generalized malaise and fatigue. She collapsed to the floor about 2 days prior to hospitalization, unable to standback on her feet. On her initial physical examination blood pressure 123/68, heart rate77, respiratory rate27, oxygen saturation 91%,mild pallor, dry mucous membranes,lungs with scattered rhonchi no rales or wheezing,abdomen protuberant, soft, trace nonpitting lower extremity edema. Sodium 133, potassium 4.1, chloride 101, bicarb23, glucose 150, BUN10, creatinine 0.60, calcium 7.5, albumin 2.5, AST96, ALT33,  white count 9.5, hemoglobin 11.9, hematocrit 35.1, platelets 124.Urinalysis specific gravity 1.020, 0-5 red cells, 11-20 white cells.  Head CT with a 5 mm nodular hyperdensity within the posterior right temporal lobe. May reflect metastasis, small acute parenchymal hemorrhage cannot be excluded. Multiple known calvarial metastasis.  Patient tested positive for COVID-19 on admission.  Further work-up with brain MRI revealed acute ischemic left cerebellar infarct and concern for tiny subacute nonhemorrhagic infarct in the right centrum ovale.  10-24/10-25 patient was febrile, 10/26 blood cultures positive for Streptococcus midis/orabilis,she was placed on antibiotic therapy with IV ceftriaxone.  Transthoracic echocardiography no vegetations. She declined transesophageal echocardiography.  Her brain lesions were determined to be infarct, possible septic emboli. Neurology recommended aspirin and follow-up with neurology as an outpatient.  1.  Patient developed sepsis, not present on admission, related to streptococcal bacteremia.  She responded well to antibiotic therapy, Jasmine Hood from infectious disease has recommended to continue therapy with continuous infusion of penicillin, to complete April 13, 2020.  Her discharge white cell count is 4.5, she has remained afebrile, her follow-up blood cultures from 10/ 28 remain no growth. Patient underwent right IJ tunneled CVC catheter placement for antibiotic therapy.  2.  Acute left cerebellar infarct, hemorrhagic right frontal infarct.  Patient underwent further work-up with CT angiography of the head and neck which showed no emergent large vessel occlusion or high-grade stenosis.  Mild bilateral carotid bifurcation atherosclerosis without hemodynamic significant stenosis.  Neurology was consulted, she was seen to have septic emboli, recommendation to continue aspirin and have follow-up neurology evaluation.  Physical  therapy/occupational therapy evaluation the patient and recommended home health services.  3. Possible septic emboli with infarct, see # 2.   4.  Hypothyroidism.  At home patient not taking levothyroxine,  her TSH is mildly elevated 5.61, for now recommend low-dose levothyroxine..  5.  Metastatic breast cancer, thrombocytopenia/ anemia chronic due malignacy.  Patient with bilateral mastectomies.  She had adjuvant radiation therapy along with chemotherapy.  She has been taking anastrozole. Patient will follow up with Jasmine. Jana Hood as an outpatient.  Follow-up with palliative care as an outpatient.  Hgb stable at 10.2 and hct at 30.9  6.  Hypertension.  Her blood pressure remained well controlled.  7.  Obesity class III, obstructive sleep apnea.  Calculated BMI is 45.9, continue CPAP as an outpatient.  8.  Hyponatremia.  Electrolytes were corrected, kidney function stable.  9.  Positive COVID-19 infection.  Further work-up with CT chest which was negative for pulmonary embolism, faint bilateral groundglass opacities. Considering malignancy, clinical immunosuppression and advanced age patient received monoclonal antibody with Hood toleration.  Patient had no increased oxygen requirements.  Discharge Diagnoses:  Principal Problem:   Weakness Active Problems:   Hypothyroidism   Essential hypertension   OSA (obstructive sleep apnea)   Malignant neoplasm of overlapping sites of right breast in female, estrogen receptor positive ([place])   Bone metastases ([place])   Stroke ([place])   COVID-19   Palliative care by specialist   Advanced care planning/counseling discussion   Goals of care, counseling/discussion    Discharge Instructions:  Discharge Instructions    [facility] pharmacist to adjust dose for Vancomycin, Aminoglycosides and other anti-infective therapies as requested by physician.   Complete by: As directed    [facility] to provide Cath Flo 79m    Complete by: As directed    Administer for PICC line occlusion and as ordered by physician for other access device issues.   Ambulatory referral to Neurology   Complete by: As directed    An appointment is requested in approximately: 2 weeks. Follow up on CVA   Anaphylaxis Kit: Provided to treat any anaphylactic reaction to the medication being provided to the patient if First Dose or when requested by physician   Complete by: As directed    Epinephrine 18mml vial / amp: Administer 0.52m25m0.52ml95mubcutaneously once for moderate to severe anaphylaxis, nurse to call physician and pharmacy when reaction occurs and call 911 if needed for immediate care   Diphenhydramine 50mg48mIV vial: Administer 25-50mg 97mM PRN for first dose reaction, rash, itching, mild reaction, nurse to call physician and pharmacy when reaction occurs   Sodium Chloride 0.9% NS 500ml I40mdminister if needed for hypovolemic blood pressure drop or as ordered by physician after call to physician with anaphylactic reaction   Call MD for:  persistant nausea and vomiting   Complete by: As directed    Call MD for:  redness, tenderness, or signs of infection (pain, swelling, redness, odor or green/yellow discharge around incision site)   Complete by: As directed    Call MD for:  severe uncontrolled pain   Complete by: As directed    Call MD for:  temperature >100.4   Complete by: As directed    Change dressing on IV access line weekly and PRN   Complete by: As directed    Diet - low sodium heart healthy   Complete by: As directed    Discharge instructions   Complete by: As directed    Please follow up with primary care in 7 days. Continue self quarantine for 7 days, use a mask in public and maintain physical distancing.   Flush IV access with Sodium Chloride 0.9% and  Heparin 10 units/ml or 100 units/ml   Complete by: As directed    Home infusion instructions - [facility]   Complete by: As directed     Instructions: Flush IV access with Sodium Chloride 0.9% and Heparin 10units/ml or 100units/ml   Change dressing on IV access line: Weekly and PRN   Instructions Cath Flo 60m: Administer for PICC Line occlusion and as ordered by physician for other access device   [facility] pharmacist to adjust dose for: Vancomycin, Aminoglycosides and other anti-infective therapies as requested by physician   Increase activity slowly   Complete by: As directed    Increase activity slowly   Complete by: As directed    Method of administration may be changed at the discretion of home infusion pharmacist based upon assessment of the patient and/or caregiver's ability to self-administer the medication ordered   Complete by: As directed    No wound care   Complete by: As directed    No wound care   Complete by: As directed      Allergies as of 03/19/2020      Reactions   Bee Venom Anaphylaxis   Latex Swelling      Medication List    STOP taking these medications   calcium carbonate 500 MG chewable tablet Commonly known as: Tums     TAKE these medications   anastrozole 1 MG tablet Commonly known as: ARIMIDEX Take 1 tablet (1 mg total) by mouth daily.   aspirin 81 MG chewable tablet Chew 1 tablet (81 mg total) by mouth daily.   Ensure Max Protein Liqd Take 330 mLs (11 oz total) by mouth 2 (two) times daily.   levothyroxine 25 MCG tablet Commonly known as: SYNTHROID Take 1 tablet (25 mcg total) by mouth daily at 6 (six) AM.   pantoprazole 40 MG tablet Commonly known as: PROTONIX Take 1 tablet (40 mg total) by mouth daily.   penicillin G  IVPB Inject 24 Million Units into the vein daily for 26 days. As a continuous infusion. Indication:  Possible endocarditis First Dose: No Last Day of Therapy:  04/11/2020 Labs - Once weekly:  CBC/D and BMP, Labs - Every other week:  ESR and CRP Method of administration: Elastomeric (Continuous infusion) Method of administration may be  changed at the discretion of home infusion pharmacist based upon assessment of the patient and/or caregiver's ability to self-administer the medication ordered.   polyethylene glycol 17 g packet Commonly known as: MIRALAX / GLYCOLAX Take 17 g by mouth 2 (two) times daily.            Discharge Care Instructions  (From admission, onward)         Start     Ordered   03/17/20 0000  Change dressing on IV access line weekly and PRN  (Home infusion instructions - [facility] )        03/17/20 1241          Follow-up Information    [facility].   [place] This is the home health agency that will be following up with you at home for services Contact information: [address] [place] [phone]        HIsaac Hood ERayford Halsted MD Follow up in 1 week(s).   Specialty: Internal Medicine Contact information: [phone]        Magrinat, GVirgie Dad MD  Follow up.   Specialty: Oncology Contact information: [address] [place] [address] [phone]              Allergies  Allergen Reactions  . Bee Venom Anaphylaxis  . Latex Swelling    Consultations:  Infectious disease  Neurology   Heme/onc  IR   Procedures/Studies: CT ANGIO HEAD W OR WO CONTRAST  Result Date: 03/10/2020 CLINICAL DATA:  Stroke follow-up EXAM: CT ANGIOGRAPHY HEAD AND NECK TECHNIQUE: Multidetector CT imaging of the head and neck was performed using the standard protocol during bolus administration of intravenous contrast. Multiplanar CT image reconstructions and MIPs were obtained to evaluate the vascular anatomy. Carotid stenosis measurements (when applicable) are obtained utilizing NASCET criteria, using the distal internal carotid diameter as the denominator. CONTRAST:  159m OMNIPAQUE IOHEXOL 350 MG/ML SOLN COMPARISON:  Brain MRI  03/09/2020 FINDINGS: CT HEAD FINDINGS Brain: Unchanged punctate focus of blood in the posterior right temporal lobe (7:44). The size and configuration of the ventricles and extra-axial CSF spaces are normal. Skull: Multiple sclerotic calvarial lesions. Sinuses/Orbits: No fluid levels or advanced mucosal thickening of the visualized paranasal sinuses. No mastoid or middle ear effusion. The orbits are normal. CTA NECK FINDINGS SKELETON: Multiple ill-defined sclerotic foci.  No fracture. OTHER NECK: Normal pharynx, larynx and major salivary glands. No cervical lymphadenopathy. Unremarkable thyroid gland. UPPER CHEST: No pneumothorax or pleural effusion. No nodules or masses. AORTIC ARCH: There is no calcific atherosclerosis of the aortic arch. There is no aneurysm, dissection or hemodynamically significant stenosis of the visualized portion of the aorta. Conventional 3 vessel aortic branching pattern. The visualized proximal subclavian arteries are widely patent. RIGHT CAROTID SYSTEM: No dissection, occlusion or aneurysm. Mild atherosclerotic calcification at the carotid bifurcation without hemodynamically significant stenosis. LEFT CAROTID SYSTEM: No dissection, occlusion or aneurysm. Mild atherosclerotic calcification at the carotid bifurcation without hemodynamically significant stenosis. VERTEBRAL ARTERIES: Left dominant configuration. Both origins are clearly patent. There is no dissection, occlusion or flow-limiting stenosis to the skull base (V1-V3 segments). CTA HEAD FINDINGS POSTERIOR CIRCULATION: --Vertebral arteries: Normal V4 segments. --Inferior cerebellar arteries: Normal. --Basilar artery: Normal. --Superior cerebellar arteries: Normal. --Posterior cerebral arteries (PCA): Normal. ANTERIOR CIRCULATION: --Intracranial internal carotid arteries: Normal. --Anterior cerebral arteries (ACA): Normal. Both A1 segments are present. Patent anterior communicating artery (a-comm). --Middle cerebral arteries (MCA):  Normal. VENOUS SINUSES: As permitted by contrast timing, patent. ANATOMIC VARIANTS: None Review of the MIP images confirms the above findings. IMPRESSION: 1. Unchanged punctate focus of blood in the posterior right temporal lobe. 2. No emergent large vessel occlusion or high-grade stenosis of the intracranial or cervical arteries. 3. Mild bilateral carotid bifurcation atherosclerosis without hemodynamically significant stenosis. 4. Multiple ill-defined sclerotic calvarial and vertebral lesions, compatible with known metastatic disease. Electronically Signed   By: KUlyses JarredM.D.   On: 03/10/2020 23:20   DG Abd 1 View  Result Date: 03/13/2020 CLINICAL DATA:  Abdominal pain and distension EXAM: ABDOMEN - 1 VIEW COMPARISON:  March 09, 2020 FINDINGS: There is moderate stool throughout the colon. No bowel dilatation or air-fluid level to suggest bowel obstruction. No free air. Scattered foci of vascular calcification noted. IMPRESSION: Moderate stool in colon.  No evident bowel obstruction or free air. Electronically Signed   By: WLowella GripIII M.D.   On: 03/13/2020 14:37   CT Head Wo Contrast  Result Date: 03/09/2020 CLINICAL DATA:  Head trauma, minor. Additional history provided: Patient found down, stage IV breast cancer with known metastases. EXAM: CT HEAD WITHOUT  CONTRAST TECHNIQUE: Contiguous axial images were obtained from the base of the skull through the vertex without intravenous contrast. COMPARISON:  Brain MRI 09/27/2017.  Head CT 02/19/2017. FINDINGS: Brain: Mild generalized cerebral atrophy. There is a 5 mm nodular hyperdensity focus measuring 60 Hounsfield units within the posterior right temporal lobe (series 5, image 43) (series 6, image 11). No demarcated cortical infarct. No extra-axial fluid collection. No midline shift. Partially empty sella turcica. Vascular: No hyperdense vessel. Skull: Multiple known calvarial metastases were better appreciated on the prior brain MRI of  09/27/2017. Sinuses/Orbits: Visualized orbits show no acute finding. Frothy secretions within the right sphenoid sinus. No significant mastoid effusion. These results were called by telephone at the time of interpretation on 03/09/2020 at 2:57 pm to provider [facility] , who verbally acknowledged these results. IMPRESSION: 5 mm nodular hyperdensity within the posterior right temporal as described. This may reflect a hyperdense metastasis. A small acute parenchymal hemorrhage cannot be excluded. Mild generalized cerebral atrophy. Multiple known calvarial metastases were better appreciated on the prior brain MRI of 09/27/2017. Right sphenoid sinusitis. Electronically Signed   By: Kellie Simmering DO   On: 03/09/2020 14:58   CT ANGIO NECK W OR WO CONTRAST  Result Date: 03/10/2020 CLINICAL DATA:  Stroke follow-up EXAM: CT ANGIOGRAPHY HEAD AND NECK TECHNIQUE: Multidetector CT imaging of the head and neck was performed using the standard protocol during bolus administration of intravenous contrast. Multiplanar CT image reconstructions and MIPs were obtained to evaluate the vascular anatomy. Carotid stenosis measurements (when applicable) are obtained utilizing NASCET criteria, using the distal internal carotid diameter as the denominator. CONTRAST:  183m OMNIPAQUE IOHEXOL 350 MG/ML SOLN COMPARISON:  Brain MRI 03/09/2020 FINDINGS: CT HEAD FINDINGS Brain: Unchanged punctate focus of blood in the posterior right temporal lobe (7:44). The size and configuration of the ventricles and extra-axial CSF spaces are normal. Skull: Multiple sclerotic calvarial lesions. Sinuses/Orbits: No fluid levels or advanced mucosal thickening of the visualized paranasal sinuses. No mastoid or middle ear effusion. The orbits are normal. CTA NECK FINDINGS SKELETON: Multiple ill-defined sclerotic foci.  No fracture. OTHER NECK: Normal pharynx, larynx and major salivary glands. No cervical lymphadenopathy. Unremarkable thyroid gland. UPPER  CHEST: No pneumothorax or pleural effusion. No nodules or masses. AORTIC ARCH: There is no calcific atherosclerosis of the aortic arch. There is no aneurysm, dissection or hemodynamically significant stenosis of the visualized portion of the aorta. Conventional 3 vessel aortic branching pattern. The visualized proximal subclavian arteries are widely patent. RIGHT CAROTID SYSTEM: No dissection, occlusion or aneurysm. Mild atherosclerotic calcification at the carotid bifurcation without hemodynamically significant stenosis. LEFT CAROTID SYSTEM: No dissection, occlusion or aneurysm. Mild atherosclerotic calcification at the carotid bifurcation without hemodynamically significant stenosis. VERTEBRAL ARTERIES: Left dominant configuration. Both origins are clearly patent. There is no dissection, occlusion or flow-limiting stenosis to the skull base (V1-V3 segments). CTA HEAD FINDINGS POSTERIOR CIRCULATION: --Vertebral arteries: Normal V4 segments. --Inferior cerebellar arteries: Normal. --Basilar artery: Normal. --Superior cerebellar arteries: Normal. --Posterior cerebral arteries (PCA): Normal. ANTERIOR CIRCULATION: --Intracranial internal carotid arteries: Normal. --Anterior cerebral arteries (ACA): Normal. Both A1 segments are present. Patent anterior communicating artery (a-comm). --Middle cerebral arteries (MCA): Normal. VENOUS SINUSES: As permitted by contrast timing, patent. ANATOMIC VARIANTS: None Review of the MIP images confirms the above findings. IMPRESSION: 1. Unchanged punctate focus of blood in the posterior right temporal lobe. 2. No emergent large vessel occlusion or high-grade stenosis of the intracranial or cervical arteries. 3. Mild bilateral carotid bifurcation atherosclerosis without hemodynamically significant stenosis.  4. Multiple ill-defined sclerotic calvarial and vertebral lesions, compatible with known metastatic disease. Electronically Signed   By: Ulyses Jarred M.D.   On: 03/10/2020 23:20    CT ANGIO CHEST PE W OR WO CONTRAST  Result Date: 03/11/2020 CLINICAL DATA:  COVID-19 infection, positive D-dimer, stroke and history of metastatic breast carcinoma. EXAM: CT ANGIOGRAPHY CHEST WITH CONTRAST TECHNIQUE: Multidetector CT imaging of the chest was performed using the standard protocol during bolus administration of intravenous contrast. Multiplanar CT image reconstructions and MIPs were obtained to evaluate the vascular anatomy. CONTRAST:  110m OMNIPAQUE IOHEXOL 350 MG/ML SOLN COMPARISON:  CT of the chest on 06/08/2018 FINDINGS: Cardiovascular: Central pulmonary artery delineation and opacification is satisfactory and there is no evidence of significant central pulmonary embolism. Peripheral pulmonary arterial opacification and evaluation is severely limited by respiratory motion. The thoracic aorta is well opacified and demonstrates normal caliber with no evidence of aneurysmal disease or dissection. The heart size is normal. No pericardial fluid identified. No significant calcified coronary artery plaque identified. Mediastinum/Nodes: There are some small scattered mediastinal lymph nodes. The largest measures approximately 10 mm in short axis in the right lower paratracheal region. No overtly enlarged lymph nodes identified. Lungs/Pleura: Lungs demonstrate scattered areas of interstitial prominence bilaterally which were partially present on the prior scan. Some of these areas may represent active COVID pneumonia. Others are likely related to chronic disease and prior radiation therapy. Upper Abdomen: No acute abnormality. Musculoskeletal: Sclerotic metastatic disease involving multiple thoracic vertebral bodies is less prominent compared to the prior CT with some residual areas of mild sclerosis present. No associated pathologic fractures. There is a decrease in size of a chronic seroma of the right lateral chest wall after prior mastectomy. Review of the MIP images confirms the above  findings. IMPRESSION: 1. No evidence of significant central pulmonary embolism. Peripheral pulmonary arterial opacification and evaluation is severely limited by respiratory motion. 2. Scattered areas of interstitial prominence bilaterally which were partially present on the prior scan. Some of these areas may represent active COVID pneumonia. Others are likely related to chronic disease and prior radiation therapy. 3. Decrease in size of a chronic seroma of the right lateral chest wall after prior mastectomy. 4. Decreased conspicuity of sclerotic metastatic disease involving multiple thoracic vertebral bodies. Electronically Signed   By: GAletta EdouardM.D.   On: 03/11/2020 13:26   MR BRAIN WO CONTRAST  Result Date: 03/09/2020 CLINICAL DATA:  Initial evaluation for acute nausea, vomiting, weakness. History of breast cancer. EXAM: MRI HEAD WITHOUT CONTRAST TECHNIQUE: Multiplanar, multiecho pulse sequences of the brain and surrounding structures were obtained without intravenous contrast. COMPARISON:  Prior CT from earlier same day as well as previous brain MRI from 09/27/2017. FINDINGS: Brain: Diffuse prominence of the CSF containing spaces compatible generalized age-related cerebral atrophy. Patchy T2/FLAIR hyperintensity within the periventricular and deep white matter both cerebral hemispheres most consistent with chronic small vessel ischemic disease, mild for age, and relatively stable. 9 mm linear focus of restricted diffusion seen involving the left cerebellum, consistent with an acute ischemic infarct (series 5, image 69). No associated hemorrhage or mass effect. Few additional punctate foci of diffusion abnormality involving the right centrum semi ovale noted as well, which could reflect tiny subacute small vessel type infarcts (series 18, images 45, 41). No associated hemorrhage. Gray-white matter differentiation otherwise maintained. No other areas of chronic cortical infarction. 7 mm focus of  susceptibility artifacts seen involving the right temporal lobe, corresponding to prior hyperdensity seen on prior  CT (series 15, image 25). This is new as compared to prior MRI from 2019. Subtly increased localized T2/FLAIR signal intensity seen at this location suggesting edema (series 16, image 25). No significant regional mass effect. Given the history of metastatic breast cancer, finding is most concerning for a possible new intracranial metastasis. No other definite metastatic lesions are seen on this noncontrast examination. No midline shift or mass effect. Ventricles stable in size without hydrocephalus. No extra-axial fluid collection. No made of an empty sella with sellar expansion. Midline structures intact. Vascular: Major intracranial vascular flow voids are maintained. Skull and upper cervical spine: Craniocervical junction within normal limits. Few scattered T1 hypointense lesions noted about the calvarium, consistent with known osseous metastatic disease. No visible extra osseous or soft tissue component. No scalp soft tissue abnormality. Sinuses/Orbits: Patient status post bilateral ocular lens replacement. Globes and orbital soft tissues demonstrate no acute finding. Right sphenoid sinus disease noted. Paranasal sinuses are otherwise clear. Trace left mastoid effusion noted, of doubtful significance. Inner ear structures grossly normal. Other: None. IMPRESSION: 1. 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 2. Few additional punctate foci of diffusion abnormality involving the right centrum semi ovale, suspicious for tiny subacute nonhemorrhagic small vessel type infarcts. 3. 7 mm focus of susceptibility artifact involving the right temporal lobe, corresponding to hyperdensity seen on prior CT. Subtly increased localized T2/FLAIR signal intensity at this location without significant regional mass effect. Given the history of metastatic breast cancer, finding is most concerning for a  possible hemorrhagic metastasis. If possible, further evaluation with postcontrast imaging may be helpful for further evaluation. Alternatively, a short interval follow-up study in 2-3 months to evaluate for interval change may be helpful as well. 4. Scattered T1 hypointense lesions about the calvarium, consistent with known osseous metastatic disease. 5. Age-related cerebral atrophy with mild chronic small vessel ischemic disease. Electronically Signed   By: Jeannine Boga M.D.   On: 03/09/2020 22:15   MR BRAIN W WO CONTRAST  Result Date: 03/15/2020 CLINICAL DATA:  Possible hemorrhagic metastasis. EXAM: MRI HEAD WITHOUT AND WITH CONTRAST TECHNIQUE: Multiplanar, multiecho pulse sequences of the brain and surrounding structures were obtained without and with intravenous contrast. CONTRAST:  34m GADAVIST GADOBUTROL 1 MMOL/ML IV SOLN COMPARISON:  Head CT 03/10/2020 FINDINGS: Brain: Unchanged appearance of small left cerebellar infarct. There are new foci of abnormal diffusion restriction within the right corona radiata and frontal lobe. Multifocal hyperintense T2-weighted signal within the white matter. Normal volume of CSF spaces. Punctate focus hemorrhage in the right temporal lobe is unchanged. There are no contrast-enhancing lesions. Normal midline structures. Vascular: Normal flow voids. Skull and upper cervical spine: Multiple hypointense T1-weighted signal lesions of the calvarium are unchanged. Sinuses/Orbits: Negative. Other: None. IMPRESSION: 1. Multiple new foci of abnormal diffusion restriction within the right corona radiata and frontal lobe, consistent with acute to subacute infarcts. No hemorrhage or mass effect. 2. 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 3. Unchanged punctate focus of hemorrhage in the right temporal lobe. No abnormal contrast enhancement. Electronically Signed   By: KUlyses JarredM.D.   On: 03/15/2020 00:16   IR Fluoro Guide CV Line Right  Result Date:  03/17/2020 CLINICAL DATA:  COVID-19 infection, streptococcal bacteremia and cerebral infarction. History of bilateral mastectomy. Need for tunneled central venous catheter for long-term IV antibiotic therapy. EXAM: TUNNELED CENTRAL VENOUS CATHETER PLACEMENT WITH ULTRASOUND AND FLUOROSCOPIC GUIDANCE ANESTHESIA/SEDATION: None MEDICATIONS: None FLUOROSCOPY TIME:  30 seconds. PROCEDURE: The procedure, risks, benefits, and alternatives were  explained to the patient. Questions regarding the procedure were encouraged and answered. The patient understands and consents to the procedure. A timeout was performed prior to initiating the procedure. The right neck and chest were prepped with chlorhexidine in a sterile fashion, and a sterile drape was applied covering the operative field. Maximum barrier sterile technique with sterile gowns and gloves were used for the procedure. Local anesthesia was provided with 1% lidocaine. Ultrasound was used to confirm patency of the right internal jugular vein. After creating a small venotomy incision, a 21 gauge needle was advanced into the right internal jugular vein under direct, real-time ultrasound guidance. Ultrasound image documentation was performed. After securing guidewire access, a 6 French peel-away sheath was placed. A wire was kinked to measure appropriate catheter length. A 6 French, dual-lumen power line tunneled central venous catheter was chosen for placement. This was tunneled in a retrograde fashion from the chest wall to the venotomy incision. The catheter was cut to 23 cm based on guidewire measurement. The catheter was then placed through the sheath and the sheath removed. Final catheter positioning was confirmed and documented with a fluoroscopic spot image. The catheter was aspirated and flushed with saline. The venotomy incision was closed with subcuticular 4-0 Vicryl. Dermabond was applied to the incision. The catheter exit site was secured with Prolene retention  sutures. COMPLICATIONS: None.  No pneumothorax. FINDINGS: After catheter placement, the tip lies at the SVC/RA junction. The catheter aspirates normally and is ready for immediate use. IMPRESSION: Placement of tunneled central venous catheter via the right internal jugular vein. The catheter tip lies at the SVC/RA junction. The catheter is ready for immediate use. Electronically Signed   By: Aletta Edouard M.D.   On: 03/17/2020 17:03   IR US Guide Vasc Access Right  Result Date: 03/17/2020 CLINICAL DATA:  COVID-19 infection, streptococcal bacteremia and cerebral infarction. History of bilateral mastectomy. Need for tunneled central venous catheter for long-term IV antibiotic therapy. EXAM: TUNNELED CENTRAL VENOUS CATHETER PLACEMENT WITH ULTRASOUND AND FLUOROSCOPIC GUIDANCE ANESTHESIA/SEDATION: None MEDICATIONS: None FLUOROSCOPY TIME:  30 seconds. PROCEDURE: The procedure, risks, benefits, and alternatives were explained to the patient. Questions regarding the procedure were encouraged and answered. The patient understands and consents to the procedure. A timeout was performed prior to initiating the procedure. The right neck and chest were prepped with chlorhexidine in a sterile fashion, and a sterile drape was applied covering the operative field. Maximum barrier sterile technique with sterile gowns and gloves were used for the procedure. Local anesthesia was provided with 1% lidocaine. Ultrasound was used to confirm patency of the right internal jugular vein. After creating a small venotomy incision, a 21 gauge needle was advanced into the right internal jugular vein under direct, real-time ultrasound guidance. Ultrasound image documentation was performed. After securing guidewire access, a 6 French peel-away sheath was placed. A wire was kinked to measure appropriate catheter length. A 6 French, dual-lumen power line tunneled central venous catheter was chosen for placement. This was tunneled in a  retrograde fashion from the chest wall to the venotomy incision. The catheter was cut to 23 cm based on guidewire measurement. The catheter was then placed through the sheath and the sheath removed. Final catheter positioning was confirmed and documented with a fluoroscopic spot image. The catheter was aspirated and flushed with saline. The venotomy incision was closed with subcuticular 4-0 Vicryl. Dermabond was applied to the incision. The catheter exit site was secured with Prolene retention sutures. COMPLICATIONS: None.  No pneumothorax.  FINDINGS: After catheter placement, the tip lies at the SVC/RA junction. The catheter aspirates normally and is ready for immediate use. IMPRESSION: Placement of tunneled central venous catheter via the right internal jugular vein. The catheter tip lies at the SVC/RA junction. The catheter is ready for immediate use. Electronically Signed   By: Aletta Edouard M.D.   On: 03/17/2020 17:03   DG CHEST PORT 1 VIEW  Result Date: 03/14/2020 CLINICAL DATA:  COVID pneumonia. EXAM: PORTABLE CHEST 1 VIEW COMPARISON:  One-view chest x-ray 03/13/2020 FINDINGS: Heart size exaggerated by low lung volumes. Interstitial and airspace opacities are again noted, greatest in the right upper lobe and left lower lobe. Overall aeration is improving. Degenerative changes are noted at the shoulders. IMPRESSION: Improving aeration with persistent interstitial and airspace disease in the right upper lobe and left lower lobe. Electronically Signed   By: San Morelle M.D.   On: 03/14/2020 06:06   DG CHEST PORT 1 VIEW  Result Date: 03/13/2020 CLINICAL DATA:  Shortness of breath, COVID positive EXAM: PORTABLE CHEST 1 VIEW COMPARISON:  03/10/2020 FINDINGS: Portion of the left lung base is excluded. Slightly increased interstitial prominence. No significant pleural effusion. No pneumothorax. Stable cardiomediastinal contours. IMPRESSION: Slightly increased interstitial prominence, which may  reflect worsening edema or pneumonia. Electronically Signed   By: Macy Mis M.D.   On: 03/13/2020 08:51   DG CHEST PORT 1 VIEW  Result Date: 03/10/2020 CLINICAL DATA:  COVID EXAM: PORTABLE CHEST 1 VIEW COMPARISON:  11/10/2019 FINDINGS: Shallow inspiration. Cardiac enlargement. Interstitial changes in the lungs may represent multifocal pneumonia or edema. No pleural effusions. No pneumothorax. Mediastinal contours appear intact. Degenerative changes in the spine. IMPRESSION: Cardiac enlargement. Interstitial changes in the lungs may represent multifocal pneumonia or edema. Electronically Signed   By: Lucienne Capers M.D.   On: 03/10/2020 04:39   DG Abdomen Acute W/Chest  Result Date: 03/09/2020 CLINICAL DATA:  Metastatic breast cancer.  Weakness. EXAM: DG ABDOMEN ACUTE WITH 1 VIEW CHEST COMPARISON:  Chest radiograph 11/10/2019. Abdominal radiographs 11/24/2008. FINDINGS: The cardiomediastinal silhouette is unchanged. Asymmetric opacity is again noted in the right upper lobe consistent with post radiation changes. The interstitial markings are mildly prominent bilaterally without overt edema. No acute airspace consolidation, sizeable pleural effusion, pneumothorax is identified. No acute osseous abnormality is seen. There is no evidence of intraperitoneal free air. Gas is present in nondilated colon. No dilated loops of bowel are seen to suggest obstruction. IMPRESSION: Nonobstructed bowel gas pattern. Chronic lung changes without evidence of acute cardiopulmonary disease. Electronically Signed   By: Logan Bores M.D.   On: 03/09/2020 14:32   ECHOCARDIOGRAM COMPLETE  Result Date: 03/10/2020    ECHOCARDIOGRAM REPORT   Patient Name:   ELVERTA DIMICELI Eilers Date of Exam: 03/10/2020 Medical Rec #:  [id number]      Height:       59.0 in Accession #:    [id number]     Weight:       226.0 lb Date of Birth:  02/07/1946      BSA:          1.943 m Patient Age:    49 years       BP:           136/68 mmHg Patient  Gender: F              HR:           87 bpm. Exam Location:  Inpatient Procedure: 2D Echo, Cardiac Doppler, Color  Doppler and Intracardiac            Opacification Agent Indications:   Stroke 434.91 / I163.9  History:       Patient has no prior history of Echocardiogram examinations.                Stroke.  Sonographer:   Bernadene Person RDCS Referring      [place] Phys: IMPRESSIONS  1. Left ventricular ejection fraction, by estimation, is 70 to 75%. The left ventricle has hyperdynamic function. The left ventricle has no regional wall motion abnormalities. Left ventricular diastolic parameters were normal.  2. Right ventricular systolic function is normal. The right ventricular size is normal.  3. The mitral valve is normal in structure. Mild mitral valve regurgitation. No evidence of mitral stenosis.  4. The aortic valve is tricuspid. Aortic valve regurgitation is mild. Mild aortic valve sclerosis is present, with no evidence of aortic valve stenosis.  5. The inferior vena cava is normal in size with greater than 50% respiratory variability, suggesting right atrial pressure of 3 mmHg. FINDINGS  Left Ventricle: Left ventricular ejection fraction, by estimation, is 70 to 75%. The left ventricle has hyperdynamic function. The left ventricle has no regional wall motion abnormalities. Definity contrast agent was given IV to delineate the left ventricular endocardial borders. The left ventricular internal cavity size was normal in size. There is no left ventricular hypertrophy. Left ventricular diastolic parameters were normal. Normal left ventricular filling pressure. Right Ventricle: The right ventricular size is normal. No increase in right ventricular wall thickness. Right ventricular systolic function is normal. Left Atrium: Left atrial size was normal in size. Right Atrium: Right atrial size was normal in size. Pericardium: There is no evidence of pericardial effusion. Mitral Valve: The mitral  valve is normal in structure. Mild mitral valve regurgitation. No evidence of mitral valve stenosis. Tricuspid Valve: The tricuspid valve is normal in structure. Tricuspid valve regurgitation is not demonstrated. No evidence of tricuspid stenosis. Aortic Valve: The aortic valve is tricuspid. Aortic valve regurgitation is mild. Mild aortic valve sclerosis is present, with no evidence of aortic valve stenosis. Pulmonic Valve: The pulmonic valve was normal in structure. Pulmonic valve regurgitation is not visualized. No evidence of pulmonic stenosis. Aorta: The aortic root is normal in size and structure. Venous: The inferior vena cava is normal in size with greater than 50% respiratory variability, suggesting right atrial pressure of 3 mmHg. IAS/Shunts: No atrial level shunt detected by color flow Doppler.  LEFT VENTRICLE PLAX 2D LVIDd:         4.20 cm  Diastology LVIDs:         2.50 cm  LV e' medial:    9.03 cm/s LV PW:         0.90 cm  LV E/e' medial:  9.3 LV IVS:        0.90 cm  LV e' lateral:   11.00 cm/s LVOT diam:     2.10 cm  LV E/e' lateral: 7.6 LV SV:         108 LV SV Index:   55 LVOT Area:     3.46 cm  RIGHT VENTRICLE RV S prime:     14.20 cm/s TAPSE (M-mode): 1.6 cm LEFT ATRIUM             Index       RIGHT ATRIUM           Index LA diam:  3.70 cm 1.90 cm/m  RA Area:     10.20 cm LA Vol (A2C):   37.9 ml 19.51 ml/m RA Volume:   17.50 ml  9.01 ml/m LA Vol (A4C):   34.4 ml 17.71 ml/m LA Biplane Vol: 38.3 ml 19.71 ml/m  AORTIC VALVE LVOT Vmax:   129.00 cm/s LVOT Vmean:  99.500 cm/s LVOT VTI:    0.311 m  AORTA Ao Root diam: 2.30 cm Ao Asc diam:  3.10 cm MITRAL VALVE                TRICUSPID VALVE MV Area (PHT): 3.31 cm     TR Peak grad:   21.5 mmHg MV Decel Time: 229 msec     TR Vmax:        232.00 cm/s MV E velocity: 83.90 cm/s MV A velocity: 103.00 cm/s  SHUNTS MV E/A ratio:  0.81         Systemic VTI:  0.31 m                             Systemic Diam: 2.10 cm Fransico Him MD Electronically  signed by Fransico Him MD Signature Date/Time: 03/10/2020/2:34:49 PM    Final    VAS US CAROTID  Result Date: 03/14/2020 Carotid Arterial Duplex Study Indications: CVA. Limitations  Today's exam was limited due to the patient's respiratory              variation. Performing Technologist: Abram Sander RVS  Examination Guidelines: A complete evaluation includes B-mode imaging, spectral Doppler, color Doppler, and power Doppler as needed of all accessible portions of each vessel. Bilateral testing is considered an integral part of a complete examination. Limited examinations for reoccurring indications may be performed as noted.  Right Carotid Findings: +----------+--------+--------+--------+------------------+--------+           PSV cm/sEDV cm/sStenosisPlaque DescriptionComments +----------+--------+--------+--------+------------------+--------+ CCA Prox  70      13              heterogenous               +----------+--------+--------+--------+------------------+--------+ CCA Distal75      17              heterogenous               +----------+--------+--------+--------+------------------+--------+ ICA Prox  128     32      1-39%   heterogenous               +----------+--------+--------+--------+------------------+--------+ ICA Distal94      22                                         +----------+--------+--------+--------+------------------+--------+ ECA       55      12                                         +----------+--------+--------+--------+------------------+--------+ +----------+--------+-------+--------+-------------------+           PSV cm/sEDV cmsDescribeArm Pressure (mmHG) +----------+--------+-------+--------+-------------------+ [id number]                                         +----------+--------+-------+--------+-------------------+ +---------+--------+--------+--------------+  [place] cm/sEDV cm/sNot identified  +---------+--------+--------+--------------+  Left Carotid Findings: +----------+--------+--------+--------+------------------+--------+           PSV cm/sEDV cm/sStenosisPlaque DescriptionComments +----------+--------+--------+--------+------------------+--------+ CCA Prox  95                      heterogenous               +----------+--------+--------+--------+------------------+--------+ CCA Distal82      27              heterogenous               +----------+--------+--------+--------+------------------+--------+ ICA Prox  135     38      1-39%   heterogenous               +----------+--------+--------+--------+------------------+--------+ ICA Distal110     37                                         +----------+--------+--------+--------+------------------+--------+ ECA       120     10                                         +----------+--------+--------+--------+------------------+--------+ +----------+--------+--------+--------+-------------------+           PSV cm/sEDV cm/sDescribeArm Pressure (mmHG) +----------+--------+--------+--------+-------------------+ [id number]                                          +----------+--------+--------+--------+-------------------+ +---------+--------+--+--------+--+---------+ VertebralPSV cm/s66EDV cm/s20Antegrade +---------+--------+--+--------+--+---------+   Summary: Right Carotid: Velocities in the right ICA are consistent with a 1-39% stenosis. Left Carotid: Velocities in the left ICA are consistent with a 1-39% stenosis. Vertebrals: Left vertebral artery demonstrates antegrade flow. Right vertebral             artery was not visualized. *See table(s) above for measurements and observations.  Electronically signed by Antony Contras MD on 03/14/2020 at 2:07:34 PM.    Final    VAS [place] LOWER EXTREMITY VENOUS (DVT)  Result Date: 03/10/2020  Lower Venous DVTStudy Indications: Stroke.  Limitations: Pain  tolerance. Comparison Study: no prior Performing Technologist: Abram Sander RVS  Examination Guidelines: A complete evaluation includes B-mode imaging, spectral Doppler, color Doppler, and power Doppler as needed of all accessible portions of each vessel. Bilateral testing is considered an integral part of a complete examination. Limited examinations for reoccurring indications may be performed as noted. The reflux portion of the exam is performed with the patient in reverse Trendelenburg.  +---------+---------------+---------+-----------+----------+--------------+ RIGHT    CompressibilityPhasicitySpontaneityPropertiesThrombus Aging +---------+---------------+---------+-----------+----------+--------------+ CFV      Full           Yes      Yes                                 +---------+---------------+---------+-----------+----------+--------------+ SFJ      Full                                                        +---------+---------------+---------+-----------+----------+--------------+  FV Prox  Full                                                        +---------+---------------+---------+-----------+----------+--------------+ FV Mid                  Yes      Yes                                 +---------+---------------+---------+-----------+----------+--------------+ FV Distal               Yes      Yes                                 +---------+---------------+---------+-----------+----------+--------------+ PFV      Full                                                        +---------+---------------+---------+-----------+----------+--------------+ POP      Full           Yes      Yes                                 +---------+---------------+---------+-----------+----------+--------------+ PTV      Full                                                        +---------+---------------+---------+-----------+----------+--------------+ PERO                                                   Not visualized +---------+---------------+---------+-----------+----------+--------------+   +---------+---------------+---------+-----------+----------+--------------+ LEFT     CompressibilityPhasicitySpontaneityPropertiesThrombus Aging +---------+---------------+---------+-----------+----------+--------------+ CFV      Full           Yes      Yes                                 +---------+---------------+---------+-----------+----------+--------------+ SFJ      Full                                                        +---------+---------------+---------+-----------+----------+--------------+ FV Prox  Full                                                        +---------+---------------+---------+-----------+----------+--------------+  FV Mid                  Yes      Yes                                 +---------+---------------+---------+-----------+----------+--------------+ FV Distal               Yes      Yes                                 +---------+---------------+---------+-----------+----------+--------------+ PFV      Full                                                        +---------+---------------+---------+-----------+----------+--------------+ POP      Full           Yes      Yes                                 +---------+---------------+---------+-----------+----------+--------------+ PTV      Full                                                        +---------+---------------+---------+-----------+----------+--------------+ PERO                                                  Not visualized +---------+---------------+---------+-----------+----------+--------------+     Summary: BILATERAL: - No evidence of deep vein thrombosis seen in the lower extremities, bilaterally. - No evidence of superficial venous thrombosis in the lower extremities, bilaterally. -   *See table(s)  above for measurements and observations. Electronically signed by Servando Snare MD on 03/10/2020 at 1:10:27 PM.    Final    [place] EKG SITE RITE  Result Date: 03/17/2020 If Site Rite image not attached, placement could not be confirmed due to current cardiac rhythm.      Subjective: Feels well this am. Has not had any worsening symptoms. She is anxious to get home.  Discharge Exam: Vitals:   03/18/20 2133 03/19/20 0559  BP: (!) 148/63 (!) 148/57  Pulse: 94 93  Resp: 20 20  Temp: 99.1 F (37.3 C) 97.6 F (36.4 C)  SpO2: 93% 94%   Vitals:   03/18/20 1310 03/18/20 2133 03/19/20 0500 03/19/20 0559  BP: (!) 147/68 (!) 148/63  (!) 148/57  Pulse: 84 94  93  Resp: 16 20  20   Temp: 98.7 F (37.1 C) 99.1 F (37.3 C)  97.6 F (36.4 C)  TempSrc: Oral Oral  Oral  SpO2: 94% 93%  94%  Weight:   101.1 kg   Height:        General: Pt is alert, awake, not in acute distress Cardiovascular: RRR, S1/S2 +, no rubs, no gallops Respiratory: CTA bilaterally, no wheezing, no rhonchi Abdominal: Soft, NT, ND,  bowel sounds + Extremities: no edema, no cyanosis    The results of significant diagnostics from this hospitalization (including imaging, microbiology, ancillary and laboratory) are listed below for reference.     Microbiology: Recent Results (from the past 240 hour(s))  Respiratory Panel by RT PCR (Flu A&B, Covid) -     Status: Abnormal   Collection Time: 03/09/20  3:41 PM   Specimen: Nasopharyngeal  Result Value Ref Range Status   SARS Coronavirus 2 by RT PCR POSITIVE (A) NEGATIVE Final    Comment: RESULT CALLED TO, READ BACK BY AND VERIFIED WITH: R.GROVES AT [place] ON 03/09/20 BY N.THOMPSON (NOTE) SARS-CoV-2 target nucleic acids are DETECTED.  SARS-CoV-2 RNA is generally detectable in upper respiratory specimens  during the acute phase of infection. Positive results are indicative of the presence of the identified virus, but do not rule out bacterial infection or co-infection  with other pathogens not detected by the test. Clinical correlation with patient history and other diagnostic information is necessary to determine patient infection status. The expected result is Negative.  Fact Sheet for Patients:  PinkCheek.be  Fact Sheet for Healthcare Providers: GravelBags.it  This test is not yet approved or cleared by the [place] FDA and  has been authorized for detection and/or diagnosis of SARS-CoV-2 by FDA under an Emergency Use Authorization (EUA).  This EUA will remain in effect (meaning this test  can be used) for the duration of  the COVID-19 declaration under Section 564(b)(1) of the Act, 21 U.S.C. section 360bbb-3(b)(1), unless the authorization is terminated or revoked sooner.      Influenza A by PCR NEGATIVE NEGATIVE Final   Influenza B by PCR NEGATIVE NEGATIVE Final    Comment: (NOTE) The Xpert Xpress SARS-CoV-2/FLU/RSV assay is intended as an aid in  the diagnosis of influenza from Nasopharyngeal swab specimens and  should not be used as a sole basis for treatment. Nasal washings and  aspirates are unacceptable for Xpert Xpress SARS-CoV-2/FLU/RSV  testing.  Fact Sheet for Patients: PinkCheek.be  Fact Sheet for Healthcare Providers: GravelBags.it  This test is not yet approved or cleared by the [place] FDA and  has been authorized for detection and/or diagnosis of SARS-CoV-2 by  FDA under an Emergency Use Authorization (EUA). This EUA will remain  in effect (meaning this test can be used) for the duration of the  Covid-19 declaration under Section 564(b)(1) of the Act, 21  U.S.C. section 360bbb-3(b)(1), unless the authorization is  terminated or revoked. Performed at [facility], [place] [address], [place], [place] [address]   Culture, Urine     Status: Abnormal   Collection Time: 03/09/20   3:41 PM   Specimen: Urine, Clean Catch  Result Value Ref Range Status   Specimen Description   Final    URINE, CLEAN CATCH Performed at [facility], [place] [address], [place], [place] [address]    Special Requests   Final    NONE Performed at [facility], [place] [address], [place], [place] [address]    Culture MULTIPLE SPECIES PRESENT, SUGGEST RECOLLECTION (A)  Final   Report Status 03/11/2020 FINAL  Final  Culture, blood (routine x 2)     Status: Abnormal   Collection Time: 03/13/20  2:53 PM   Specimen: BLOOD LEFT FOREARM  Result Value Ref Range Status   Specimen Description BLOOD LEFT FOREARM  Final   Special Requests   Final    BOTTLES DRAWN AEROBIC AND ANAEROBIC Blood Culture  adequate volume   Culture  Setup Time   Final    GRAM POSITIVE COCCI IN CHAINS IN BOTH AEROBIC AND ANAEROBIC BOTTLES CRITICAL RESULT CALLED TO, READ BACK BY AND VERIFIED WITH: E. WILLIAMSON,PHARMD 0600 03/14/2020 T. TYSOR    Culture STREPTOCOCCUS MITIS/ORALIS (A)  Final   Report Status 03/16/2020 FINAL  Final   Organism ID, Bacteria STREPTOCOCCUS MITIS/ORALIS  Final      Susceptibility   Streptococcus mitis/oralis - MIC*    TETRACYCLINE >=16 RESISTANT Resistant     VANCOMYCIN 0.5 SENSITIVE Sensitive     CLINDAMYCIN >=1 RESISTANT Resistant     PENICILLIN Value in next row Sensitive      SENSITIVE0.06    CEFTRIAXONE Value in next row Sensitive      SENSITIVE0.12Performed at [facility], [place] [address] [place], [place] [address]    * STREPTOCOCCUS MITIS/ORALIS  Blood Culture ID Panel (Reflexed)     Status: Abnormal   Collection Time: 03/13/20  2:53 PM  Result Value Ref Range Status   Enterococcus faecalis NOT DETECTED NOT DETECTED Final   Enterococcus Faecium NOT DETECTED NOT DETECTED Final   Listeria monocytogenes NOT DETECTED NOT DETECTED Final   Staphylococcus species NOT DETECTED NOT DETECTED Final   Staphylococcus aureus (BCID) NOT DETECTED NOT  DETECTED Final   Staphylococcus epidermidis NOT DETECTED NOT DETECTED Final   Staphylococcus lugdunensis NOT DETECTED NOT DETECTED Final   Streptococcus species DETECTED (A) NOT DETECTED Final    Comment: Not Enterococcus species, Streptococcus agalactiae, Streptococcus pyogenes, or Streptococcus pneumoniae. CRITICAL RESULT CALLED TO, READ BACK BY AND VERIFIED WITH: E. WILLIAMSON,PHARMD 0600 03/14/2020 T. TYSOR    Streptococcus agalactiae NOT DETECTED NOT DETECTED Final   Streptococcus pneumoniae NOT DETECTED NOT DETECTED Final   Streptococcus pyogenes NOT DETECTED NOT DETECTED Final   A.calcoaceticus-baumannii NOT DETECTED NOT DETECTED Final   Bacteroides fragilis NOT DETECTED NOT DETECTED Final   Enterobacterales NOT DETECTED NOT DETECTED Final   Enterobacter cloacae complex NOT DETECTED NOT DETECTED Final   Escherichia coli NOT DETECTED NOT DETECTED Final   Klebsiella aerogenes NOT DETECTED NOT DETECTED Final   Klebsiella oxytoca NOT DETECTED NOT DETECTED Final   Klebsiella pneumoniae NOT DETECTED NOT DETECTED Final   Proteus species NOT DETECTED NOT DETECTED Final   Salmonella species NOT DETECTED NOT DETECTED Final   Serratia marcescens NOT DETECTED NOT DETECTED Final   Haemophilus influenzae NOT DETECTED NOT DETECTED Final   Neisseria meningitidis NOT DETECTED NOT DETECTED Final   Pseudomonas aeruginosa NOT DETECTED NOT DETECTED Final   Stenotrophomonas maltophilia NOT DETECTED NOT DETECTED Final   Candida albicans NOT DETECTED NOT DETECTED Final   Candida auris NOT DETECTED NOT DETECTED Final   Candida glabrata NOT DETECTED NOT DETECTED Final   Candida krusei NOT DETECTED NOT DETECTED Final   Candida parapsilosis NOT DETECTED NOT DETECTED Final   Candida tropicalis NOT DETECTED NOT DETECTED Final   Cryptococcus neoformans/gattii NOT DETECTED NOT DETECTED Final    Comment: Performed at [facility], [address] [place], [place] [address]  Culture, Urine     Status:  Abnormal   Collection Time: 03/13/20  8:00 PM   Specimen: Urine, Random  Result Value Ref Range Status   Specimen Description   Final    URINE, RANDOM Performed at [place] [address] [place], [place] [address]    Special Requests   Final    NONE Performed at [facility], [place] [address] [place],  [place] [address]    Culture MULTIPLE SPECIES PRESENT, SUGGEST RECOLLECTION (A)  Final   Report Status 03/15/2020 FINAL  Final  Culture, blood (routine x 2)     Status: Abnormal   Collection Time: 03/14/20  4:28 AM   Specimen: BLOOD LEFT HAND  Result Value Ref Range Status   Specimen Description BLOOD LEFT HAND  Final   Special Requests   Final    BOTTLES DRAWN AEROBIC ONLY Blood Culture results may not be optimal due to an inadequate volume of blood received in culture bottles   Culture  Setup Time   Final    AEROBIC BOTTLE ONLY GRAM POSITIVE COCCI IN CHAINS CRITICAL VALUE NOTED.  VALUE IS CONSISTENT WITH PREVIOUSLY REPORTED AND CALLED VALUE.    Culture (A)  Final    STREPTOCOCCUS MITIS/ORALIS SUSCEPTIBILITIES PERFORMED ON PREVIOUS CULTURE WITHIN THE LAST 5 DAYS. Performed at [facility], [place] [address], [place], [place] [address]    Report Status 03/16/2020 FINAL  Final  Culture, blood (routine x 2)     Status: Abnormal   Collection Time: 03/14/20  5:57 AM   Specimen: BLOOD  Result Value Ref Range Status   Specimen Description   Final    BLOOD LEFT ARM Performed at [place] [address] [place], [place] [address]    Special Requests   Final    BOTTLES DRAWN AEROBIC ONLY Performed at [place] [address], [place], [place] [address]    Culture  Setup Time   Final    AEROBIC BOTTLE ONLY GRAM POSITIVE COCCI CRITICAL VALUE NOTED.  VALUE IS CONSISTENT WITH PREVIOUSLY REPORTED AND CALLED VALUE.    Culture (A)  Final    STREPTOCOCCUS MITIS/ORALIS SUSCEPTIBILITIES PERFORMED  ON PREVIOUS CULTURE WITHIN THE LAST 5 DAYS. Performed at [facility], [place] [address] [place], [place] [address]    Report Status 03/16/2020 FINAL  Final  Culture, blood (routine x 2)     Status: None (Preliminary result)   Collection Time: 03/16/20  5:12 PM   Specimen: BLOOD LEFT HAND  Result Value Ref Range Status   Specimen Description   Final    BLOOD LEFT HAND Performed at [place] [address] [place], [place] [address]    Special Requests   Final    BOTTLES DRAWN AEROBIC AND ANAEROBIC Blood Culture adequate volume   Culture   Final    NO GROWTH 2 DAYS Performed at [facility], [place] [address] [place], [place] [address]    Report Status PENDING  Incomplete  Culture, blood (routine x 2)     Status: None (Preliminary result)   Collection Time: 03/16/20  5:19 PM   Specimen: BLOOD LEFT HAND  Result Value Ref Range Status   Specimen Description   Final    BLOOD LEFT HAND Performed at [place] [address], [place], [place] [address]    Special Requests   Final    BOTTLES DRAWN AEROBIC AND ANAEROBIC Blood Culture adequate volume   Culture   Final    NO GROWTH 2 DAYS Performed at [facility], [place] [address] [place],  [address]    Report Status PENDING  Incomplete     Labs: BNP (last 3 results) Recent Labs    03/10/20 1700 03/14/20 0333  BNP 93.0 824.2*   Basic Metabolic Panel: Recent Labs  Lab 03/13/20 0615 03/14/20 3536 03/15/20 0355 03/17/20 0319  NA 135 136 133* 134*  K 4.5 3.7 4.1 4.1  CL 102 100 101 102  CO2 25 27 23 24   GLUCOSE 128* 125* 153* 130*  BUN 10 10 10 12   CREATININE 0.56 0.52 0.54 0.51  CALCIUM 7.7* 7.8* 7.8* 7.6*  MG 1.8 1.8 1.8  --   PHOS 3.8 3.7 3.0  --    Liver Function Tests: Recent Labs  Lab 03/13/20 0615 03/14/20 0333 03/15/20 0355 03/17/20 0319  AST 81* 77* 86* 75*  ALT 38 40 41 37  ALKPHOS 114 127* 139* 136*  BILITOT 1.0 0.8 0.9 0.9  PROT  5.7* 5.9* 6.4* 6.1*  ALBUMIN 2.0* 2.2* 2.3* 2.1*   No results for input(s): LIPASE, AMYLASE in the last 168 hours. No results for input(s): AMMONIA in the last 168 hours. CBC: Recent Labs  Lab 03/13/20 0615 03/14/20 0333 03/15/20 0355 03/17/20 0439  WBC 4.1 4.1 5.0 4.5  NEUTROABS 3.0 2.8 4.1  --   HGB 10.5* 10.2* 11.2* 10.2*  HCT 32.0* 31.2* 34.2* 30.9*  MCV 108.1* 107.2* 106.5* 106.2*  PLT 97* 100* 99* 112*   Urinalysis    Component Value Date/Time   COLORURINE YELLOW 03/13/2020 2000   APPEARANCEUR HAZY (A) 03/13/2020 2000   LABSPEC 1.008 03/13/2020 2000   PHURINE 7.0 03/13/2020 2000   GLUCOSEU NEGATIVE 03/13/2020 2000   GLUCOSEU NEGATIVE 01/25/2010 0858   HGBUR MODERATE (A) 03/13/2020 2000   HGBUR 1+ 12/30/2008 0838   BILIRUBINUR NEGATIVE 03/13/2020 2000   BILIRUBINUR Neg 04/28/2015 1122   KETONESUR NEGATIVE 03/13/2020 2000   PROTEINUR NEGATIVE 03/13/2020 2000   UROBILINOGEN 0.2 04/28/2015 1122   UROBILINOGEN 0.2 01/25/2010 0858   NITRITE NEGATIVE 03/13/2020 2000   LEUKOCYTESUR TRACE (A) 03/13/2020 2000   Sepsis Labs Microbiology Recent Results (from the past 240 hour(s))  Respiratory Panel by RT PCR (Flu A&B, Covid) -     Status: Abnormal   Collection Time: 03/09/20  3:41 PM   Specimen: Nasopharyngeal  Result Value Ref Range Status   SARS Coronavirus 2 by RT PCR POSITIVE (A) NEGATIVE Final    Comment: RESULT CALLED TO, READ BACK BY AND VERIFIED WITH: R.GROVES AT [facility] ON 03/09/20 BY N.THOMPSON (NOTE) SARS-CoV-2 target nucleic acids are DETECTED.  SARS-CoV-2 RNA is generally detectable in upper respiratory specimens  during the acute phase of infection. Positive results are indicative of the presence of the identified virus, but do not rule out bacterial infection or co-infection with other pathogens not detected by the test. Clinical correlation with patient history and other diagnostic information is necessary to determine patient infection status. The  expected result is Negative.  Fact Sheet for Patients:  PinkCheek.be  Fact Sheet for Healthcare Providers: GravelBags.it  This test is not yet approved or cleared by the [place] FDA and  has been authorized for detection and/or diagnosis of SARS-CoV-2 by FDA under an Emergency Use Authorization (EUA).  This EUA will remain in effect (meaning this test  can be used) for the duration of  the COVID-19 declaration under Section 564(b)(1) of the Act, 21 U.S.C. section 360bbb-3(b)(1), unless the authorization is terminated or revoked sooner.      Influenza A by PCR NEGATIVE NEGATIVE Final   Influenza B by PCR NEGATIVE NEGATIVE Final    Comment: (NOTE) The Xpert Xpress SARS-CoV-2/FLU/RSV assay is intended as an aid in  the diagnosis of influenza from Nasopharyngeal swab specimens and  should not be used as a sole basis for treatment. Nasal washings and  aspirates are unacceptable for Xpert Xpress SARS-CoV-2/FLU/RSV  testing.  Fact Sheet for Patients: PinkCheek.be  Fact Sheet for Healthcare Providers: GravelBags.it  This test is not yet approved or cleared by the [place] FDA and  has been authorized for detection and/or diagnosis of SARS-CoV-2 by  FDA under an Emergency Use Authorization (EUA). This EUA will remain  in effect (meaning this test can be used) for the duration of the  Covid-19 declaration under Section 564(b)(1) of the Act, 21  U.S.C. section 360bbb-3(b)(1), unless the authorization is  terminated or revoked. Performed at [facility], [place] [address], [place], [place] [address]   Culture, Urine     Status: Abnormal   Collection Time: 03/09/20  3:41 PM   Specimen: Urine, Clean Catch  Result Value Ref Range Status   Specimen Description   Final    URINE, CLEAN CATCH Performed at [facility], [place]  [address], [place], [place] [address]    Special Requests   Final    NONE Performed at [facility], [place] [address], [place], [place] [address]    Culture MULTIPLE SPECIES PRESENT, SUGGEST RECOLLECTION (A)  Final   Report Status 03/11/2020 FINAL  Final  Culture, blood (routine x 2)     Status: Abnormal   Collection Time: 03/13/20  2:53 PM   Specimen: BLOOD LEFT FOREARM  Result Value Ref Range Status   Specimen Description BLOOD LEFT FOREARM  Final   Special Requests   Final    BOTTLES DRAWN AEROBIC AND ANAEROBIC Blood Culture adequate volume   Culture  Setup Time   Final    GRAM POSITIVE COCCI IN CHAINS IN BOTH AEROBIC AND ANAEROBIC BOTTLES CRITICAL RESULT CALLED TO, READ BACK BY AND VERIFIED WITH: E. WILLIAMSON,PHARMD 0600 03/14/2020 T. TYSOR    Culture STREPTOCOCCUS MITIS/ORALIS (A)  Final   Report Status 03/16/2020 FINAL  Final   Organism ID, Bacteria STREPTOCOCCUS MITIS/ORALIS  Final      Susceptibility   Streptococcus mitis/oralis - MIC*    TETRACYCLINE >=16 RESISTANT Resistant     VANCOMYCIN 0.5 SENSITIVE Sensitive     CLINDAMYCIN >=1 RESISTANT Resistant     PENICILLIN Value in next row Sensitive      SENSITIVE0.06    CEFTRIAXONE Value in next row Sensitive      SENSITIVE0.12Performed at [facility], [place] [address], [place], [place] [address]    * STREPTOCOCCUS MITIS/ORALIS  Blood Culture ID Panel (Reflexed)     Status: Abnormal   Collection Time: 03/13/20  2:53 PM  Result Value Ref Range Status   Enterococcus faecalis NOT DETECTED NOT DETECTED Final   Enterococcus Faecium NOT DETECTED NOT DETECTED Final   Listeria monocytogenes NOT DETECTED NOT DETECTED Final   Staphylococcus species NOT DETECTED NOT DETECTED Final   Staphylococcus aureus (BCID) NOT DETECTED NOT DETECTED Final   Staphylococcus epidermidis NOT DETECTED NOT DETECTED Final   Staphylococcus lugdunensis NOT DETECTED NOT DETECTED Final   Streptococcus species DETECTED (A) NOT  DETECTED Final    Comment: Not Enterococcus species, Streptococcus agalactiae, Streptococcus pyogenes, or Streptococcus pneumoniae. CRITICAL RESULT CALLED TO, READ BACK BY AND VERIFIED WITH: E. WILLIAMSON,PHARMD 0600 03/14/2020 T. TYSOR    Streptococcus agalactiae NOT DETECTED NOT DETECTED Final   Streptococcus pneumoniae NOT DETECTED NOT DETECTED Final   Streptococcus pyogenes NOT DETECTED NOT DETECTED Final   A.calcoaceticus-baumannii NOT DETECTED NOT DETECTED Final   Bacteroides fragilis NOT DETECTED NOT DETECTED Final   Enterobacterales NOT DETECTED  NOT DETECTED Final   Enterobacter cloacae complex NOT DETECTED NOT DETECTED Final   Escherichia coli NOT DETECTED NOT DETECTED Final   Klebsiella aerogenes NOT DETECTED NOT DETECTED Final   Klebsiella oxytoca NOT DETECTED NOT DETECTED Final   Klebsiella pneumoniae NOT DETECTED NOT DETECTED Final   Proteus species NOT DETECTED NOT DETECTED Final   Salmonella species NOT DETECTED NOT DETECTED Final   Serratia marcescens NOT DETECTED NOT DETECTED Final   Haemophilus influenzae NOT DETECTED NOT DETECTED Final   Neisseria meningitidis NOT DETECTED NOT DETECTED Final   Pseudomonas aeruginosa NOT DETECTED NOT DETECTED Final   Stenotrophomonas maltophilia NOT DETECTED NOT DETECTED Final   Candida albicans NOT DETECTED NOT DETECTED Final   Candida auris NOT DETECTED NOT DETECTED Final   Candida glabrata NOT DETECTED NOT DETECTED Final   Candida krusei NOT DETECTED NOT DETECTED Final   Candida parapsilosis NOT DETECTED NOT DETECTED Final   Candida tropicalis NOT DETECTED NOT DETECTED Final   Cryptococcus neoformans/gattii NOT DETECTED NOT DETECTED Final    Comment: Performed at [facility], [place] [address] [place], [place] [address]  Culture, Urine     Status: Abnormal   Collection Time: 03/13/20  8:00 PM   Specimen: Urine, Random  Result Value Ref Range Status   Specimen Description   Final    URINE, RANDOM Performed at [place] [address], [place], [place] [address]    Special Requests   Final    NONE Performed at [facility], [place] [address], [place], [place] [address]    Culture MULTIPLE SPECIES PRESENT, SUGGEST RECOLLECTION (A)  Final   Report Status 03/15/2020 FINAL  Final  Culture, blood (routine x 2)     Status: Abnormal   Collection Time: 03/14/20  4:28 AM   Specimen: BLOOD LEFT HAND  Result Value Ref Range Status   Specimen Description BLOOD LEFT HAND  Final   Special Requests   Final    BOTTLES DRAWN AEROBIC ONLY Blood Culture results may not be optimal due to an inadequate volume of blood received in culture bottles   Culture  Setup Time   Final    AEROBIC BOTTLE ONLY GRAM POSITIVE COCCI IN CHAINS CRITICAL VALUE NOTED.  VALUE IS CONSISTENT WITH PREVIOUSLY REPORTED AND CALLED VALUE.    Culture (A)  Final    STREPTOCOCCUS MITIS/ORALIS SUSCEPTIBILITIES PERFORMED ON PREVIOUS CULTURE WITHIN THE LAST 5 DAYS. Performed at [facility], [place] [address], [place], [place] [address]    Report Status 03/16/2020 FINAL  Final  Culture, blood (routine x 2)     Status: Abnormal   Collection Time: 03/14/20  5:57 AM   Specimen: BLOOD  Result Value Ref Range Status   Specimen Description   Final    BLOOD LEFT ARM Performed at [place] [address] [place], [place] [address]    Special Requests   Final    BOTTLES DRAWN AEROBIC ONLY Performed at [place] [address], [place], [place] [address]    Culture  Setup Time   Final    AEROBIC BOTTLE ONLY GRAM POSITIVE COCCI CRITICAL VALUE NOTED.  VALUE IS CONSISTENT WITH PREVIOUSLY REPORTED AND CALLED VALUE.    Culture (A)  Final    STREPTOCOCCUS MITIS/ORALIS SUSCEPTIBILITIES PERFORMED ON PREVIOUS CULTURE WITHIN THE LAST 5 DAYS. Performed at [facility], [place] [address] [place], [place] [address]    Report Status 03/16/2020 FINAL  Final  Culture, blood  (routine x 2)     Status: None (Preliminary result)   Collection Time: 03/16/20  5:12 PM   Specimen: BLOOD LEFT HAND  Result Value Ref Range Status   Specimen Description   Final    BLOOD LEFT HAND Performed at [place] [address] [place], [place] [address]    Special Requests   Final    BOTTLES DRAWN AEROBIC AND ANAEROBIC Blood Culture adequate volume   Culture   Final    NO GROWTH 2 DAYS Performed at [facility], [place] [address], [place], [place] [address]    Report Status PENDING  Incomplete  Culture, blood (routine x 2)     Status: None (Preliminary result)   Collection Time: 03/16/20  5:19 PM   Specimen: BLOOD LEFT HAND  Result Value Ref Range Status   Specimen Description   Final    BLOOD LEFT HAND Performed at [place] [address] [place], [place] [address]    Special Requests   Final    BOTTLES DRAWN AEROBIC AND ANAEROBIC Blood Culture adequate volume   Culture   Final    NO GROWTH 2 DAYS Performed at [facility], [place] [address] [place], [place] [address]    Report Status PENDING  Incomplete     Time coordinating discharge: Over 30 minutes  SIGNED:   Donnamae Jude, MD  [facility] 03/19/2020, 9:58 AM  If 7PM-7AM, please contact night-coverage

## 2020-03-20 ENCOUNTER — Telehealth: Payer: Self-pay | Admitting: Internal Medicine

## 2020-03-20 ENCOUNTER — Telehealth: Payer: Self-pay

## 2020-03-20 ENCOUNTER — Telehealth: Payer: Self-pay | Admitting: Oncology

## 2020-03-20 DIAGNOSIS — A419 Sepsis, unspecified organism: Secondary | ICD-10-CM | POA: Diagnosis not present

## 2020-03-20 NOTE — Telephone Encounter (Signed)
Transition Care Management Follow-up Telephone Call  Date of discharge and from where: 03/19/2020 from [facility]   How have you been since you were released from the hospital? Patient states that she is doing ok   Any questions or concerns? No  Items Reviewed:  Did the pt receive and understand the discharge instructions provided? Yes   Medications obtained and verified? Yes   Other? No   Any new allergies since your discharge? No   Dietary orders reviewed? Yes  Do you have support at home? Yes   Home Care and Equipment/Supplies: Were home health services ordered? yes If so, what is the name of the agency? [facility]   Has the agency set up a time to come to the patient's home? yes Were any new equipment or medical supplies ordered?  No What is the name of the medical supply agency? N/A  Were you able to get the supplies/equipment? no Do you have any questions related to the use of the equipment or supplies? No  Functional Questionnaire: (I = Independent and D = Dependent) ADLs: D  Bathing/Dressing- D  Meal Prep- D  Eating- I  Maintaining continence- D  Transferring/Ambulation- D walks with a family member   Managing Meds- D   Follow up appointments reviewed:   PCP Hospital f/u appt confirmed? No  .  [facility] f/u appt confirmed? No    Are transportation arrangements needed? No   If their condition worsens, is the pt aware to call PCP or go to the Emergency Dept.? Yes  Was the patient provided with contact information for the PCP's office or ED? Yes  Was to pt encouraged to call back with questions or concerns? Yes

## 2020-03-20 NOTE — Telephone Encounter (Signed)
schedule appt per p10/31 sch msg - pt is aware of appt date and time

## 2020-03-20 NOTE — Telephone Encounter (Signed)
Called to get patient an appointment scheduled with Dr. Baxter Flattery in 3/4 weeks, no answer and no voicemail set up

## 2020-03-21 ENCOUNTER — Telehealth: Payer: Self-pay | Admitting: Internal Medicine

## 2020-03-21 DIAGNOSIS — G319 Degenerative disease of nervous system, unspecified: Secondary | ICD-10-CM | POA: Diagnosis not present

## 2020-03-21 DIAGNOSIS — A419 Sepsis, unspecified organism: Secondary | ICD-10-CM | POA: Diagnosis not present

## 2020-03-21 DIAGNOSIS — C50811 Malignant neoplasm of overlapping sites of right female breast: Secondary | ICD-10-CM | POA: Diagnosis not present

## 2020-03-21 DIAGNOSIS — G4733 Obstructive sleep apnea (adult) (pediatric): Secondary | ICD-10-CM | POA: Diagnosis not present

## 2020-03-21 DIAGNOSIS — Z452 Encounter for adjustment and management of vascular access device: Secondary | ICD-10-CM | POA: Diagnosis not present

## 2020-03-21 DIAGNOSIS — M479 Spondylosis, unspecified: Secondary | ICD-10-CM | POA: Diagnosis not present

## 2020-03-21 DIAGNOSIS — I080 Rheumatic disorders of both mitral and aortic valves: Secondary | ICD-10-CM | POA: Diagnosis not present

## 2020-03-21 DIAGNOSIS — Z8673 Personal history of transient ischemic attack (TIA), and cerebral infarction without residual deficits: Secondary | ICD-10-CM | POA: Diagnosis not present

## 2020-03-21 DIAGNOSIS — Z5181 Encounter for therapeutic drug level monitoring: Secondary | ICD-10-CM | POA: Diagnosis not present

## 2020-03-21 DIAGNOSIS — D696 Thrombocytopenia, unspecified: Secondary | ICD-10-CM | POA: Diagnosis not present

## 2020-03-21 DIAGNOSIS — E871 Hypo-osmolality and hyponatremia: Secondary | ICD-10-CM | POA: Diagnosis not present

## 2020-03-21 DIAGNOSIS — Z79899 Other long term (current) drug therapy: Secondary | ICD-10-CM | POA: Diagnosis not present

## 2020-03-21 DIAGNOSIS — A408 Other streptococcal sepsis: Secondary | ICD-10-CM | POA: Diagnosis not present

## 2020-03-21 DIAGNOSIS — Z9013 Acquired absence of bilateral breasts and nipples: Secondary | ICD-10-CM | POA: Diagnosis not present

## 2020-03-21 DIAGNOSIS — E039 Hypothyroidism, unspecified: Secondary | ICD-10-CM | POA: Diagnosis not present

## 2020-03-21 DIAGNOSIS — D630 Anemia in neoplastic disease: Secondary | ICD-10-CM | POA: Diagnosis not present

## 2020-03-21 DIAGNOSIS — Z7982 Long term (current) use of aspirin: Secondary | ICD-10-CM | POA: Diagnosis not present

## 2020-03-21 DIAGNOSIS — J323 Chronic sphenoidal sinusitis: Secondary | ICD-10-CM | POA: Diagnosis not present

## 2020-03-21 DIAGNOSIS — I119 Hypertensive heart disease without heart failure: Secondary | ICD-10-CM | POA: Diagnosis not present

## 2020-03-21 DIAGNOSIS — Z86010 Personal history of colonic polyps: Secondary | ICD-10-CM | POA: Diagnosis not present

## 2020-03-21 DIAGNOSIS — Z170 Estrogen receptor positive status [ER+]: Secondary | ICD-10-CM | POA: Diagnosis not present

## 2020-03-21 DIAGNOSIS — C7951 Secondary malignant neoplasm of bone: Secondary | ICD-10-CM | POA: Diagnosis not present

## 2020-03-21 DIAGNOSIS — E860 Dehydration: Secondary | ICD-10-CM | POA: Diagnosis not present

## 2020-03-21 LAB — CULTURE, BLOOD (ROUTINE X 2)
Culture: NO GROWTH
Culture: NO GROWTH
Special Requests: ADEQUATE
Special Requests: ADEQUATE

## 2020-03-21 NOTE — Telephone Encounter (Signed)
[facility] for requested [place] orders. When was her positive COVID test? Will need to wait 21 days from positive test to see in office for routine care. Can arrange for VV if any issues that need to be addressed sooner.

## 2020-03-21 NOTE — Telephone Encounter (Signed)
Diane w/[facility] is calling in stating that she needs verbal orders for home health nursing and bring in a social worker and pt is COVID positive and need a HFU appointment.  Diane would like to have a call back.

## 2020-03-21 NOTE — Telephone Encounter (Signed)
The only way I can block him into my schedule is if they arrange for a virtual/phone visit. BUT I cannot speak to him without patient being present unless we can confirm that she allows it.

## 2020-03-22 DIAGNOSIS — A419 Sepsis, unspecified organism: Secondary | ICD-10-CM | POA: Diagnosis not present

## 2020-03-22 NOTE — Telephone Encounter (Signed)
Left message on machine for Jasmine Hood to return our call

## 2020-03-22 NOTE — Telephone Encounter (Signed)
Patient's family has opted out of the visit at this time with the son as he does not want to be in close range with his mother while she is under quarantine. They will let us know after her quarantine if they would like to schedule

## 2020-03-23 DIAGNOSIS — A419 Sepsis, unspecified organism: Secondary | ICD-10-CM | POA: Diagnosis not present

## 2020-03-24 DIAGNOSIS — G4733 Obstructive sleep apnea (adult) (pediatric): Secondary | ICD-10-CM | POA: Diagnosis not present

## 2020-03-24 DIAGNOSIS — A408 Other streptococcal sepsis: Secondary | ICD-10-CM | POA: Diagnosis not present

## 2020-03-24 DIAGNOSIS — Z170 Estrogen receptor positive status [ER+]: Secondary | ICD-10-CM | POA: Diagnosis not present

## 2020-03-24 DIAGNOSIS — D630 Anemia in neoplastic disease: Secondary | ICD-10-CM | POA: Diagnosis not present

## 2020-03-24 DIAGNOSIS — Z9013 Acquired absence of bilateral breasts and nipples: Secondary | ICD-10-CM | POA: Diagnosis not present

## 2020-03-24 DIAGNOSIS — C7951 Secondary malignant neoplasm of bone: Secondary | ICD-10-CM | POA: Diagnosis not present

## 2020-03-24 DIAGNOSIS — Z8673 Personal history of transient ischemic attack (TIA), and cerebral infarction without residual deficits: Secondary | ICD-10-CM | POA: Diagnosis not present

## 2020-03-24 DIAGNOSIS — J323 Chronic sphenoidal sinusitis: Secondary | ICD-10-CM | POA: Diagnosis not present

## 2020-03-24 DIAGNOSIS — I080 Rheumatic disorders of both mitral and aortic valves: Secondary | ICD-10-CM | POA: Diagnosis not present

## 2020-03-24 DIAGNOSIS — G319 Degenerative disease of nervous system, unspecified: Secondary | ICD-10-CM | POA: Diagnosis not present

## 2020-03-24 DIAGNOSIS — E871 Hypo-osmolality and hyponatremia: Secondary | ICD-10-CM | POA: Diagnosis not present

## 2020-03-24 DIAGNOSIS — Z79899 Other long term (current) drug therapy: Secondary | ICD-10-CM | POA: Diagnosis not present

## 2020-03-24 DIAGNOSIS — Z86010 Personal history of colonic polyps: Secondary | ICD-10-CM | POA: Diagnosis not present

## 2020-03-24 DIAGNOSIS — M479 Spondylosis, unspecified: Secondary | ICD-10-CM | POA: Diagnosis not present

## 2020-03-24 DIAGNOSIS — E860 Dehydration: Secondary | ICD-10-CM | POA: Diagnosis not present

## 2020-03-24 DIAGNOSIS — Z7982 Long term (current) use of aspirin: Secondary | ICD-10-CM | POA: Diagnosis not present

## 2020-03-24 DIAGNOSIS — A419 Sepsis, unspecified organism: Secondary | ICD-10-CM | POA: Diagnosis not present

## 2020-03-24 DIAGNOSIS — Z452 Encounter for adjustment and management of vascular access device: Secondary | ICD-10-CM | POA: Diagnosis not present

## 2020-03-24 DIAGNOSIS — D696 Thrombocytopenia, unspecified: Secondary | ICD-10-CM | POA: Diagnosis not present

## 2020-03-24 DIAGNOSIS — C50811 Malignant neoplasm of overlapping sites of right female breast: Secondary | ICD-10-CM | POA: Diagnosis not present

## 2020-03-24 DIAGNOSIS — I119 Hypertensive heart disease without heart failure: Secondary | ICD-10-CM | POA: Diagnosis not present

## 2020-03-24 DIAGNOSIS — E039 Hypothyroidism, unspecified: Secondary | ICD-10-CM | POA: Diagnosis not present

## 2020-03-24 NOTE — Telephone Encounter (Signed)
Left message on machine for Jasmine Hood to return our call

## 2020-03-25 DIAGNOSIS — A419 Sepsis, unspecified organism: Secondary | ICD-10-CM | POA: Diagnosis not present

## 2020-03-26 DIAGNOSIS — A419 Sepsis, unspecified organism: Secondary | ICD-10-CM | POA: Diagnosis not present

## 2020-03-27 DIAGNOSIS — D696 Thrombocytopenia, unspecified: Secondary | ICD-10-CM | POA: Diagnosis not present

## 2020-03-27 DIAGNOSIS — Z86010 Personal history of colonic polyps: Secondary | ICD-10-CM | POA: Diagnosis not present

## 2020-03-27 DIAGNOSIS — A408 Other streptococcal sepsis: Secondary | ICD-10-CM | POA: Diagnosis not present

## 2020-03-27 DIAGNOSIS — E039 Hypothyroidism, unspecified: Secondary | ICD-10-CM | POA: Diagnosis not present

## 2020-03-27 DIAGNOSIS — C50811 Malignant neoplasm of overlapping sites of right female breast: Secondary | ICD-10-CM | POA: Diagnosis not present

## 2020-03-27 DIAGNOSIS — Z7982 Long term (current) use of aspirin: Secondary | ICD-10-CM | POA: Diagnosis not present

## 2020-03-27 DIAGNOSIS — A419 Sepsis, unspecified organism: Secondary | ICD-10-CM | POA: Diagnosis not present

## 2020-03-27 DIAGNOSIS — Z170 Estrogen receptor positive status [ER+]: Secondary | ICD-10-CM | POA: Diagnosis not present

## 2020-03-27 DIAGNOSIS — M479 Spondylosis, unspecified: Secondary | ICD-10-CM | POA: Diagnosis not present

## 2020-03-27 DIAGNOSIS — Z8673 Personal history of transient ischemic attack (TIA), and cerebral infarction without residual deficits: Secondary | ICD-10-CM | POA: Diagnosis not present

## 2020-03-27 DIAGNOSIS — J323 Chronic sphenoidal sinusitis: Secondary | ICD-10-CM | POA: Diagnosis not present

## 2020-03-27 DIAGNOSIS — G319 Degenerative disease of nervous system, unspecified: Secondary | ICD-10-CM | POA: Diagnosis not present

## 2020-03-27 DIAGNOSIS — D630 Anemia in neoplastic disease: Secondary | ICD-10-CM | POA: Diagnosis not present

## 2020-03-27 DIAGNOSIS — C7951 Secondary malignant neoplasm of bone: Secondary | ICD-10-CM | POA: Diagnosis not present

## 2020-03-27 DIAGNOSIS — Z452 Encounter for adjustment and management of vascular access device: Secondary | ICD-10-CM | POA: Diagnosis not present

## 2020-03-27 DIAGNOSIS — I080 Rheumatic disorders of both mitral and aortic valves: Secondary | ICD-10-CM | POA: Diagnosis not present

## 2020-03-27 DIAGNOSIS — Z79899 Other long term (current) drug therapy: Secondary | ICD-10-CM | POA: Diagnosis not present

## 2020-03-27 DIAGNOSIS — E860 Dehydration: Secondary | ICD-10-CM | POA: Diagnosis not present

## 2020-03-27 DIAGNOSIS — E871 Hypo-osmolality and hyponatremia: Secondary | ICD-10-CM | POA: Diagnosis not present

## 2020-03-27 DIAGNOSIS — G4733 Obstructive sleep apnea (adult) (pediatric): Secondary | ICD-10-CM | POA: Diagnosis not present

## 2020-03-27 DIAGNOSIS — I119 Hypertensive heart disease without heart failure: Secondary | ICD-10-CM | POA: Diagnosis not present

## 2020-03-27 DIAGNOSIS — Z9013 Acquired absence of bilateral breasts and nipples: Secondary | ICD-10-CM | POA: Diagnosis not present

## 2020-03-28 ENCOUNTER — Telehealth: Payer: Self-pay | Admitting: *Deleted

## 2020-03-28 ENCOUNTER — Telehealth: Payer: Self-pay | Admitting: Internal Medicine

## 2020-03-28 DIAGNOSIS — Z515 Encounter for palliative care: Secondary | ICD-10-CM

## 2020-03-28 DIAGNOSIS — R531 Weakness: Secondary | ICD-10-CM

## 2020-03-28 DIAGNOSIS — I639 Cerebral infarction, unspecified: Secondary | ICD-10-CM

## 2020-03-28 DIAGNOSIS — A419 Sepsis, unspecified organism: Secondary | ICD-10-CM | POA: Diagnosis not present

## 2020-03-28 NOTE — Telephone Encounter (Signed)
Yes

## 2020-03-28 NOTE — Telephone Encounter (Signed)
Pt needs an Rx for a medical mattress for her hospital bed.   Please fax rx to [facility] [phone] fax   phone [phone].

## 2020-03-28 NOTE — Telephone Encounter (Signed)
Front desk scheduling patient's hospital follow up, 11/30 for her preferred afternoon visit.  Patient would like to know if this can be a virtual appointment.  Please advise. Landis Gandy, RN

## 2020-03-28 NOTE — Telephone Encounter (Signed)
OK per Dr Baxter Flattery for this patient for virtual hospital follow up visit on 11/30.  Patient notified, appointment has been changed. Landis Gandy, RN

## 2020-03-28 NOTE — Telephone Encounter (Signed)
Okay to order?

## 2020-03-29 DIAGNOSIS — E860 Dehydration: Secondary | ICD-10-CM | POA: Diagnosis not present

## 2020-03-29 DIAGNOSIS — D696 Thrombocytopenia, unspecified: Secondary | ICD-10-CM | POA: Diagnosis not present

## 2020-03-29 DIAGNOSIS — I080 Rheumatic disorders of both mitral and aortic valves: Secondary | ICD-10-CM | POA: Diagnosis not present

## 2020-03-29 DIAGNOSIS — D630 Anemia in neoplastic disease: Secondary | ICD-10-CM | POA: Diagnosis not present

## 2020-03-29 DIAGNOSIS — Z79899 Other long term (current) drug therapy: Secondary | ICD-10-CM | POA: Diagnosis not present

## 2020-03-29 DIAGNOSIS — Z9013 Acquired absence of bilateral breasts and nipples: Secondary | ICD-10-CM | POA: Diagnosis not present

## 2020-03-29 DIAGNOSIS — A408 Other streptococcal sepsis: Secondary | ICD-10-CM | POA: Diagnosis not present

## 2020-03-29 DIAGNOSIS — Z452 Encounter for adjustment and management of vascular access device: Secondary | ICD-10-CM | POA: Diagnosis not present

## 2020-03-29 DIAGNOSIS — E039 Hypothyroidism, unspecified: Secondary | ICD-10-CM | POA: Diagnosis not present

## 2020-03-29 DIAGNOSIS — I119 Hypertensive heart disease without heart failure: Secondary | ICD-10-CM | POA: Diagnosis not present

## 2020-03-29 DIAGNOSIS — Z86010 Personal history of colonic polyps: Secondary | ICD-10-CM | POA: Diagnosis not present

## 2020-03-29 DIAGNOSIS — G4733 Obstructive sleep apnea (adult) (pediatric): Secondary | ICD-10-CM | POA: Diagnosis not present

## 2020-03-29 DIAGNOSIS — C7951 Secondary malignant neoplasm of bone: Secondary | ICD-10-CM | POA: Diagnosis not present

## 2020-03-29 DIAGNOSIS — G319 Degenerative disease of nervous system, unspecified: Secondary | ICD-10-CM | POA: Diagnosis not present

## 2020-03-29 DIAGNOSIS — Z7982 Long term (current) use of aspirin: Secondary | ICD-10-CM | POA: Diagnosis not present

## 2020-03-29 DIAGNOSIS — E871 Hypo-osmolality and hyponatremia: Secondary | ICD-10-CM | POA: Diagnosis not present

## 2020-03-29 DIAGNOSIS — Z8673 Personal history of transient ischemic attack (TIA), and cerebral infarction without residual deficits: Secondary | ICD-10-CM | POA: Diagnosis not present

## 2020-03-29 DIAGNOSIS — J323 Chronic sphenoidal sinusitis: Secondary | ICD-10-CM | POA: Diagnosis not present

## 2020-03-29 DIAGNOSIS — Z170 Estrogen receptor positive status [ER+]: Secondary | ICD-10-CM | POA: Diagnosis not present

## 2020-03-29 DIAGNOSIS — M479 Spondylosis, unspecified: Secondary | ICD-10-CM | POA: Diagnosis not present

## 2020-03-29 DIAGNOSIS — C50811 Malignant neoplasm of overlapping sites of right female breast: Secondary | ICD-10-CM | POA: Diagnosis not present

## 2020-03-29 DIAGNOSIS — A419 Sepsis, unspecified organism: Secondary | ICD-10-CM | POA: Diagnosis not present

## 2020-03-29 NOTE — Telephone Encounter (Signed)
Order placed

## 2020-03-30 ENCOUNTER — Telehealth: Payer: Self-pay | Admitting: Internal Medicine

## 2020-03-30 DIAGNOSIS — E871 Hypo-osmolality and hyponatremia: Secondary | ICD-10-CM | POA: Diagnosis not present

## 2020-03-30 DIAGNOSIS — Z79899 Other long term (current) drug therapy: Secondary | ICD-10-CM | POA: Diagnosis not present

## 2020-03-30 DIAGNOSIS — A408 Other streptococcal sepsis: Secondary | ICD-10-CM | POA: Diagnosis not present

## 2020-03-30 DIAGNOSIS — Z170 Estrogen receptor positive status [ER+]: Secondary | ICD-10-CM | POA: Diagnosis not present

## 2020-03-30 DIAGNOSIS — G319 Degenerative disease of nervous system, unspecified: Secondary | ICD-10-CM | POA: Diagnosis not present

## 2020-03-30 DIAGNOSIS — Z452 Encounter for adjustment and management of vascular access device: Secondary | ICD-10-CM | POA: Diagnosis not present

## 2020-03-30 DIAGNOSIS — Z9013 Acquired absence of bilateral breasts and nipples: Secondary | ICD-10-CM | POA: Diagnosis not present

## 2020-03-30 DIAGNOSIS — D630 Anemia in neoplastic disease: Secondary | ICD-10-CM | POA: Diagnosis not present

## 2020-03-30 DIAGNOSIS — C7951 Secondary malignant neoplasm of bone: Secondary | ICD-10-CM | POA: Diagnosis not present

## 2020-03-30 DIAGNOSIS — E860 Dehydration: Secondary | ICD-10-CM | POA: Diagnosis not present

## 2020-03-30 DIAGNOSIS — G4733 Obstructive sleep apnea (adult) (pediatric): Secondary | ICD-10-CM | POA: Diagnosis not present

## 2020-03-30 DIAGNOSIS — Z7982 Long term (current) use of aspirin: Secondary | ICD-10-CM | POA: Diagnosis not present

## 2020-03-30 DIAGNOSIS — I080 Rheumatic disorders of both mitral and aortic valves: Secondary | ICD-10-CM | POA: Diagnosis not present

## 2020-03-30 DIAGNOSIS — M479 Spondylosis, unspecified: Secondary | ICD-10-CM | POA: Diagnosis not present

## 2020-03-30 DIAGNOSIS — Z86010 Personal history of colonic polyps: Secondary | ICD-10-CM | POA: Diagnosis not present

## 2020-03-30 DIAGNOSIS — D696 Thrombocytopenia, unspecified: Secondary | ICD-10-CM | POA: Diagnosis not present

## 2020-03-30 DIAGNOSIS — I119 Hypertensive heart disease without heart failure: Secondary | ICD-10-CM | POA: Diagnosis not present

## 2020-03-30 DIAGNOSIS — Z8673 Personal history of transient ischemic attack (TIA), and cerebral infarction without residual deficits: Secondary | ICD-10-CM | POA: Diagnosis not present

## 2020-03-30 DIAGNOSIS — J323 Chronic sphenoidal sinusitis: Secondary | ICD-10-CM | POA: Diagnosis not present

## 2020-03-30 DIAGNOSIS — E039 Hypothyroidism, unspecified: Secondary | ICD-10-CM | POA: Diagnosis not present

## 2020-03-30 DIAGNOSIS — A419 Sepsis, unspecified organism: Secondary | ICD-10-CM | POA: Diagnosis not present

## 2020-03-30 DIAGNOSIS — C50811 Malignant neoplasm of overlapping sites of right female breast: Secondary | ICD-10-CM | POA: Diagnosis not present

## 2020-03-30 NOTE — Telephone Encounter (Signed)
Debbie, Education officer, [employer] at [employer] .[phone] called requesting order placed for Medicaid personal care services. Debbie will fax Dr. Jerilee Hoh.

## 2020-03-30 NOTE — Telephone Encounter (Signed)
Left detailed message on machine for Debbie with verbal order.

## 2020-03-30 NOTE — Telephone Encounter (Signed)
Ok with me 

## 2020-03-31 DIAGNOSIS — A419 Sepsis, unspecified organism: Secondary | ICD-10-CM | POA: Diagnosis not present

## 2020-04-01 DIAGNOSIS — A419 Sepsis, unspecified organism: Secondary | ICD-10-CM | POA: Diagnosis not present

## 2020-04-02 DIAGNOSIS — A419 Sepsis, unspecified organism: Secondary | ICD-10-CM | POA: Diagnosis not present

## 2020-04-03 ENCOUNTER — Inpatient Hospital Stay: Payer: Medicare Other | Attending: Oncology | Admitting: Adult Health

## 2020-04-03 ENCOUNTER — Encounter: Payer: Self-pay | Admitting: Adult Health

## 2020-04-03 DIAGNOSIS — Z452 Encounter for adjustment and management of vascular access device: Secondary | ICD-10-CM | POA: Diagnosis not present

## 2020-04-03 DIAGNOSIS — D630 Anemia in neoplastic disease: Secondary | ICD-10-CM | POA: Diagnosis not present

## 2020-04-03 DIAGNOSIS — E039 Hypothyroidism, unspecified: Secondary | ICD-10-CM | POA: Diagnosis not present

## 2020-04-03 DIAGNOSIS — E871 Hypo-osmolality and hyponatremia: Secondary | ICD-10-CM | POA: Diagnosis not present

## 2020-04-03 DIAGNOSIS — C7951 Secondary malignant neoplasm of bone: Secondary | ICD-10-CM | POA: Diagnosis not present

## 2020-04-03 DIAGNOSIS — Z7982 Long term (current) use of aspirin: Secondary | ICD-10-CM | POA: Diagnosis not present

## 2020-04-03 DIAGNOSIS — Z170 Estrogen receptor positive status [ER+]: Secondary | ICD-10-CM | POA: Diagnosis not present

## 2020-04-03 DIAGNOSIS — Z79899 Other long term (current) drug therapy: Secondary | ICD-10-CM | POA: Diagnosis not present

## 2020-04-03 DIAGNOSIS — E860 Dehydration: Secondary | ICD-10-CM | POA: Diagnosis not present

## 2020-04-03 DIAGNOSIS — A419 Sepsis, unspecified organism: Secondary | ICD-10-CM | POA: Diagnosis not present

## 2020-04-03 DIAGNOSIS — I119 Hypertensive heart disease without heart failure: Secondary | ICD-10-CM | POA: Diagnosis not present

## 2020-04-03 DIAGNOSIS — I080 Rheumatic disorders of both mitral and aortic valves: Secondary | ICD-10-CM | POA: Diagnosis not present

## 2020-04-03 DIAGNOSIS — A408 Other streptococcal sepsis: Secondary | ICD-10-CM | POA: Diagnosis not present

## 2020-04-03 DIAGNOSIS — Z86010 Personal history of colonic polyps: Secondary | ICD-10-CM | POA: Diagnosis not present

## 2020-04-03 DIAGNOSIS — Z9013 Acquired absence of bilateral breasts and nipples: Secondary | ICD-10-CM | POA: Diagnosis not present

## 2020-04-03 DIAGNOSIS — G4733 Obstructive sleep apnea (adult) (pediatric): Secondary | ICD-10-CM | POA: Diagnosis not present

## 2020-04-03 DIAGNOSIS — C50811 Malignant neoplasm of overlapping sites of right female breast: Secondary | ICD-10-CM | POA: Diagnosis not present

## 2020-04-03 DIAGNOSIS — J323 Chronic sphenoidal sinusitis: Secondary | ICD-10-CM | POA: Diagnosis not present

## 2020-04-03 DIAGNOSIS — M479 Spondylosis, unspecified: Secondary | ICD-10-CM | POA: Diagnosis not present

## 2020-04-03 DIAGNOSIS — G319 Degenerative disease of nervous system, unspecified: Secondary | ICD-10-CM | POA: Diagnosis not present

## 2020-04-03 DIAGNOSIS — Z8673 Personal history of transient ischemic attack (TIA), and cerebral infarction without residual deficits: Secondary | ICD-10-CM | POA: Diagnosis not present

## 2020-04-03 DIAGNOSIS — D696 Thrombocytopenia, unspecified: Secondary | ICD-10-CM | POA: Diagnosis not present

## 2020-04-03 NOTE — Progress Notes (Deleted)
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  However she did not show for her visit 11/29/2019     REVIEW OF SYSTEMS: Jasmine Hood     BREAST CANCER HISTORY: From the original intake note  Jasmine Hood herself noted a change in her right breast sometime in September 2017. She did not immediately bring it to medical attention. When she noted some significant changes in her right nipple she saw [name] and was set up for bilateral diagnostic mammography with tomography and right breast [facility] Oct [facility]. This found the breast density to be category B. In the upper and lower outer quadrants of the right breast there was a mass measuring at least 10 cm. There were also groups of heterogeneous calcifications measuring 3 cm and a separate group 0.4 cm. On physical exam there was a palpable firm mass measuring approximately 10 cm involving the upper outer and lower outer quadrant of the right breast, with skin reaction and nipple retraction. There was no palpable right axillary adenopathy.  Right breast ultrasonography confirmed a large hypoechoic mass with irregular margins extending to the skin surface. The right axilla showed 2  lymph nodes which appeared abnormal.  In the left breast there were some indeterminate retroareolar calcifications which were felt to warrant biopsy. This was performed 10/08/2016 and showed a fibroadenoma ([id number]).  Biopsy of the right breast mass at the 7:00 and 10:00 positions as well as one of the suspicious lymph nodes all showed invasive ductal carcinoma, E-cadherin positive. Separate prognostic profiles from the 2 breast masses were sent. Estrogen receptor was positive at 95-70%, progesterone receptor was positive at 15-85%, all with strong staining intensity, the MIB-1 was 15-20%, and HER-2 was nonamplified, the signals ratio being 1.28-1.72 and the number per cell 1.85-3.95.  The patient's subsequent history is as detailed below   PAST MEDICAL HISTORY: Past Medical History:  Diagnosis Date  . Breast cancer ([place]) 10/07/2016   right breast, spine  . Brown recluse spider bite    right leg  . COLONIC POLYPS, HX OF 01/05/2009   Qualifier: Diagnosis of  By: [name]  MD, [name]   . Complication of anesthesia    slow to awaken after wisdom  teeth extraction age 74  . Constipation   . Genetic testing 11/27/2016   [name] underwent genetic counseling and testing for hereditary cancer syndromes on 11/19/2016. Her results were negative for mutations in all 46 genes analyzed by [facility]'s 46-gene Common Hereditary Cancers Panel. Genes analyzed include: APC, ATM, AXIN2, BARD1, BMPR1A, BRCA1, BRCA2, BRIP1, CDH1, CDKN2A, CHEK2, CTNNA1, DICER1, EPCAM, GREM1, HOXB13, KIT, MEN1, MLH1, MSH2, MSH3, MSH6, MUTYH, NBN,  . Hypothyroidism   . Morbid obesity ([place]) 01/05/2009   Qualifier: Diagnosis of  By: [name]  MD, [name]   . Pneumonia 1987  . Sleep apnea  no cpap yet, has not recieved results from home test yet  . Stroke [facility])    found on CT scan, no deficits    PAST SURGICAL HISTORY: Past Surgical History:  Procedure Laterality Date  . BILATERAL TOTAL MASTECTOMY WITH AXILLARY  LYMPH NODE DISSECTION Bilateral 04/04/2017   Procedure: RIGHT MASTECTOMY WITH TARGETED RIGHT AXILLARY NODE DISSECTION AND LEFT PROPHYLATIC MASTECTOMY ERAS PATHWAY;  Surgeon: Jasmine Overall, MD;  Location: [place];  Service: General;  Laterality: Bilateral;  PEC BLOCK  . BREAST BIOPSY Right 10/07/2016   invasive mammary carcinoma  . BREAST BIOPSY Left 10/08/2016   breast fibroadenoma no malignancy  . BREAST REDUCTION SURGERY Bilateral 10/28/2018   Procedure: Excision of bilateral excess breast tissue;  Surgeon: [name], DO;  Location: [place];  Service: Plastics;  Laterality: Bilateral;  2 hours, please.  Marland Kitchen CATARACT EXTRACTION Bilateral   . COLONOSCOPY W/ BIOPSIES AND POLYPECTOMY  2012  . COLONOSCOPY WITH PROPOFOL N/A 12/22/2015   Procedure: COLONOSCOPY WITH PROPOFOL;  Surgeon: [name], MD;  Location: [facility];  Service: Endoscopy;  Laterality: N/A;  . INCISION AND DRAINAGE Right 1998   surgery for spider bite on RLE; "[name]"  . IR FLUORO GUIDE CV LINE RIGHT  03/17/2020  . IR US GUIDE VASC ACCESS RIGHT  03/17/2020  . MASTECTOMY Left 04/04/2017   [name]  . MASTECTOMY WITH AXILLARY LYMPH NODE DISSECTION Right 04/04/2017  . MOLE REMOVAL  10/29/2016   Procedure: MOLE REMOVAL LEFT CHEST;  Surgeon: Jasmine Overall, MD;  Location: [facility];  Service: General;;  . PORT-A-CATH REMOVAL Left 08/05/2018   Procedure: REMOVAL PORT-A-CATH;  Surgeon: [name], DO;  Location: [place];  Service: Plastics;  Laterality: Left;  . PORTACATH PLACEMENT N/A 10/29/2016   Procedure: INSERTION PORT-A-CATH;  Surgeon: Jasmine Overall, MD;  Location: [facility];  Service: General;  Laterality: N/A;  . TUBAL LIGATION    . WISDOM TOOTH EXTRACTION  age 74    FAMILY HISTORY Family History  Problem Relation Age of Onset  . Breast cancer Mother 90       d.52 from metastatic disease  . Throat cancer Father        d.65 history of smoking  . Rheum  arthritis Sister        3 sisters pos. for osteo and RA  . Diabetes Maternal Aunt   . Cancer Maternal Grandfather        mouth cancer-chewed tobacco  . Breast cancer Cousin 20  . Breast cancer Daughter 50  . Stomach cancer Sister 38  The patient's father died at the age of 60 from laryngeal cancer in the setting of tobacco abuse. The patient's mother died at the age of 59 from metastatic breast cancer which had been diagnosed in her early 35s. The patient has 2 half-sisters and one half-brother. One of her sisters has "stomach" cancer. There is also a cousin on the maternal side with breast cancer diagnosed at age 75. No family member has been genetically tested yet   GYNECOLOGIC HISTORY:  No LMP recorded. Patient is postmenopausal. Menarche age 4, first live birth age 1, she is [place] P5. She is status post bilateral tubal ligation. She stopped having periods in 1985. She tells me she did not use hormone replacement. However a note from her bone density scan December 2001 states "this patient began hormone replacement therapy 3 months ago"   SOCIAL HISTORY:  She is originally from [place].  She worked for [employer], but she is now retired. She has survived 2 husbands. At home currently is just she and her oldest daughter Alfredo Collymore. She tells me this daughter had mild brain damage at birth and is not working, and cannot drive because of a history of seizures. However she is the one she is planning to name is her healthcare power of attorney. The patient has 13 grandchildren and 4 great-grandchildren. She attends a [employer]: At the 10/21/2016 visit the patient was given the appropriate documents to complete and notarize at her discretion.  She intends to name her youngest duaghter Dot Lanes, lives in [place], as her healthcare power of attorney.    HEALTH MAINTENANCE: Social History   Tobacco Use  . Smoking status: Never Smoker  .  Smokeless tobacco: Never Used  Vaping Use  . Vaping Use: Never used  Substance Use Topics  . Alcohol use: No  . Drug use: No     Colonoscopy: 12/22/2015/Nandigam  PAP:  Bone density: 12/18/2017 with a T score of -0.7   Allergies  Allergen Reactions  . Bee Venom Anaphylaxis  . Latex Swelling    Current Outpatient Medications  Medication Sig Dispense Refill  . anastrozole (ARIMIDEX) 1 MG tablet Take 1 tablet (1 mg total) by mouth daily. 90 tablet 4  . aspirin 81 MG chewable tablet Chew 1 tablet (81 mg total) by mouth daily. 30 tablet 0  . Ensure Max Protein (ENSURE MAX PROTEIN) LIQD Take 330 mLs (11 oz total) by mouth 2 (two) times daily. 19800 mL 0  . levothyroxine (SYNTHROID) 25 MCG tablet Take 1 tablet (25 mcg total) by mouth daily at 6 (six) AM. 30 tablet 0  . pantoprazole (PROTONIX) 40 MG tablet Take 1 tablet (40 mg total) by mouth daily. 30 tablet 0  . penicillin G IVPB Inject 24 Million Units into the vein daily for 26 days. As a continuous infusion. Indication:  Possible endocarditis First Dose: No Last Day of Therapy:  04/11/2020 Labs - Once weekly:  CBC/D and BMP, Labs - Every other week:  ESR and CRP Method of administration: Elastomeric (Continuous infusion) Method of administration may be changed at the discretion of home infusion pharmacist based upon assessment of the patient and/or caregiver's ability to self-administer the medication ordered. 26 Units 0  . polyethylene glycol (MIRALAX / GLYCOLAX) 17 g packet Take 17 g by mouth 2 (two) times daily. 14 each 0   No current facility-administered medications for this visit.    OBJECTIVE:  There were no vitals filed for this visit. Wt Readings from Last 3 Encounters:  03/19/20 222 lb 14.2 oz (101.1 kg)  11/10/19 227 lb (103 kg)  06/14/19 235 lb 8 oz (106.8 kg)   There is no height or weight on file to calculate BMI.    ECOG FS:     LAB RESULTS:  CMP     Component Value Date/Time   NA 134 (L)  03/17/2020 0319   NA 140 03/21/2017 0918   K 4.1 03/17/2020 0319   K 3.7 03/21/2017 0918   CL 102 03/17/2020 0319   CO2 24 03/17/2020 0319   CO2 23 03/21/2017 0918   GLUCOSE 130 (H) 03/17/2020 0319   GLUCOSE 120 03/21/2017 0918   BUN 12 03/17/2020 0319   BUN 6.6 (L) 03/21/2017 0918   CREATININE 0.51 03/17/2020 0319   CREATININE 0.70 02/21/2020 1307   CREATININE 0.6 03/21/2017 9509  CALCIUM 7.6 (L) 03/17/2020 0319   CALCIUM 8.6 03/21/2017 0918   PROT 6.1 (L) 03/17/2020 0319   PROT 6.3 (L) 03/21/2017 0918   ALBUMIN 2.1 (L) 03/17/2020 0319   ALBUMIN 2.4 (L) 03/21/2017 0918   AST 75 (H) 03/17/2020 0319   AST 66 (H) 02/21/2020 1307   AST 53 (H) 03/21/2017 0918   ALT 37 03/17/2020 0319   ALT 40 02/21/2020 1307   ALT 29 03/21/2017 0918   ALKPHOS 136 (H) 03/17/2020 0319   ALKPHOS 123 03/21/2017 0918   BILITOT 0.9 03/17/2020 0319   BILITOT 0.7 02/21/2020 1307   BILITOT 0.55 03/21/2017 0918   GFRNONAA >60 03/17/2020 0319   GFRNONAA >60 02/21/2020 1307   GFRAA >60 02/21/2020 1307    No results found for: Ronnald Ramp, A1GS, A2GS, BETS, BETA2SER, GAMS, MSPIKE, SPEI  No results found for: KPAFRELGTCHN, LAMBDASER, [facility]  Lab Results  Component Value Date   WBC 4.5 03/17/2020   NEUTROABS 4.1 03/15/2020   HGB 10.2 (L) 03/17/2020   HCT 30.9 (L) 03/17/2020   MCV 106.2 (H) 03/17/2020   PLT 112 (L) 03/17/2020      Chemistry      Component Value Date/Time   NA 134 (L) 03/17/2020 0319   NA 140 03/21/2017 0918   K 4.1 03/17/2020 0319   K 3.7 03/21/2017 0918   CL 102 03/17/2020 0319   CO2 24 03/17/2020 0319   CO2 23 03/21/2017 0918   BUN 12 03/17/2020 0319   BUN 6.6 (L) 03/21/2017 0918   CREATININE 0.51 03/17/2020 0319   CREATININE 0.70 02/21/2020 1307   CREATININE 0.6 03/21/2017 0918      Component Value Date/Time   CALCIUM 7.6 (L) 03/17/2020 0319   CALCIUM 8.6 03/21/2017 0918   ALKPHOS 136 (H) 03/17/2020 0319   ALKPHOS 123 03/21/2017 0918   AST 75  (H) 03/17/2020 0319   AST 66 (H) 02/21/2020 1307   AST 53 (H) 03/21/2017 0918   ALT 37 03/17/2020 0319   ALT 40 02/21/2020 1307   ALT 29 03/21/2017 0918   BILITOT 0.9 03/17/2020 0319   BILITOT 0.7 02/21/2020 1307   BILITOT 0.55 03/21/2017 0918       No results found for: LABCA2  No components found for: [id number]  No results for input(s): INR in the last 168 hours.  Urinalysis    Component Value Date/Time   COLORURINE YELLOW 03/13/2020 2000   APPEARANCEUR HAZY (A) 03/13/2020 2000   LABSPEC 1.008 03/13/2020 2000   PHURINE 7.0 03/13/2020 2000   GLUCOSEU NEGATIVE 03/13/2020 2000   GLUCOSEU NEGATIVE 01/25/2010 0858   HGBUR MODERATE (A) 03/13/2020 2000   HGBUR 1+ 12/30/2008 0838   BILIRUBINUR NEGATIVE 03/13/2020 2000   BILIRUBINUR Neg 04/28/2015 1122   KETONESUR NEGATIVE 03/13/2020 2000   PROTEINUR NEGATIVE 03/13/2020 2000   UROBILINOGEN 0.2 04/28/2015 1122   UROBILINOGEN 0.2 01/25/2010 0858   NITRITE NEGATIVE 03/13/2020 2000   LEUKOCYTESUR TRACE (A) 03/13/2020 2000     STUDIES: CT ANGIO HEAD W OR WO CONTRAST  Result Date: 03/10/2020 CLINICAL DATA:  Stroke follow-up EXAM: CT ANGIOGRAPHY HEAD AND NECK TECHNIQUE: Multidetector CT imaging of the head and neck was performed using the standard protocol during bolus administration of intravenous contrast. Multiplanar CT image reconstructions and MIPs were obtained to evaluate the vascular anatomy. Carotid stenosis measurements (when applicable) are obtained utilizing NASCET criteria, using the distal internal carotid diameter as the denominator. CONTRAST:  19m OMNIPAQUE IOHEXOL 350 MG/ML SOLN COMPARISON:  Brain MRI 03/09/2020  FINDINGS: CT HEAD FINDINGS Brain: Unchanged punctate focus of blood in the posterior right temporal lobe (7:44). The size and configuration of the ventricles and extra-axial CSF spaces are normal. Skull: Multiple sclerotic calvarial lesions. Sinuses/Orbits: No fluid levels or advanced mucosal thickening of  the visualized paranasal sinuses. No mastoid or middle ear effusion. The orbits are normal. CTA NECK FINDINGS SKELETON: Multiple ill-defined sclerotic foci.  No fracture. OTHER NECK: Normal pharynx, larynx and major salivary glands. No cervical lymphadenopathy. Unremarkable thyroid gland. UPPER CHEST: No pneumothorax or pleural effusion. No nodules or masses. AORTIC ARCH: There is no calcific atherosclerosis of the aortic arch. There is no aneurysm, dissection or hemodynamically significant stenosis of the visualized portion of the aorta. Conventional 3 vessel aortic branching pattern. The visualized proximal subclavian arteries are widely patent. RIGHT CAROTID SYSTEM: No dissection, occlusion or aneurysm. Mild atherosclerotic calcification at the carotid bifurcation without hemodynamically significant stenosis. LEFT CAROTID SYSTEM: No dissection, occlusion or aneurysm. Mild atherosclerotic calcification at the carotid bifurcation without hemodynamically significant stenosis. VERTEBRAL ARTERIES: Left dominant configuration. Both origins are clearly patent. There is no dissection, occlusion or flow-limiting stenosis to the skull base (V1-V3 segments). CTA HEAD FINDINGS POSTERIOR CIRCULATION: --Vertebral arteries: Normal V4 segments. --Inferior cerebellar arteries: Normal. --Basilar artery: Normal. --Superior cerebellar arteries: Normal. --Posterior cerebral arteries (PCA): Normal. ANTERIOR CIRCULATION: --Intracranial internal carotid arteries: Normal. --Anterior cerebral arteries (ACA): Normal. Both A1 segments are present. Patent anterior communicating artery (a-comm). --Middle cerebral arteries (MCA): Normal. VENOUS SINUSES: As permitted by contrast timing, patent. ANATOMIC VARIANTS: None Review of the MIP images confirms the above findings. IMPRESSION: 1. Unchanged punctate focus of blood in the posterior right temporal lobe. 2. No emergent large vessel occlusion or high-grade stenosis of the intracranial or  cervical arteries. 3. Mild bilateral carotid bifurcation atherosclerosis without hemodynamically significant stenosis. 4. Multiple ill-defined sclerotic calvarial and vertebral lesions, compatible with known metastatic disease. Electronically Signed   By: Ulyses Jarred M.D.   On: 03/10/2020 23:20   DG Abd 1 View  Result Date: 03/13/2020 CLINICAL DATA:  Abdominal pain and distension EXAM: ABDOMEN - 1 VIEW COMPARISON:  March 09, 2020 FINDINGS: There is moderate stool throughout the colon. No bowel dilatation or air-fluid level to suggest bowel obstruction. No free air. Scattered foci of vascular calcification noted. IMPRESSION: Moderate stool in colon.  No evident bowel obstruction or free air. Electronically Signed   By: Lowella Grip III M.D.   On: 03/13/2020 14:37   CT Head Wo Contrast  Result Date: 03/09/2020 CLINICAL DATA:  Head trauma, minor. Additional history provided: Patient found down, stage IV breast cancer with known metastases. EXAM: CT HEAD WITHOUT CONTRAST TECHNIQUE: Contiguous axial images were obtained from the base of the skull through the vertex without intravenous contrast. COMPARISON:  Brain MRI 09/27/2017.  Head CT 02/19/2017. FINDINGS: Brain: Mild generalized cerebral atrophy. There is a 5 mm nodular hyperdensity focus measuring 60 Hounsfield units within the posterior right temporal lobe (series 5, image 43) (series 6, image 11). No demarcated cortical infarct. No extra-axial fluid collection. No midline shift. Partially empty sella turcica. Vascular: No hyperdense vessel. Skull: Multiple known calvarial metastases were better appreciated on the prior brain MRI of 09/27/2017. Sinuses/Orbits: Visualized orbits show no acute finding. Frothy secretions within the right sphenoid sinus. No significant mastoid effusion. These results were called by telephone at the time of interpretation on 03/09/2020 at 2:57 pm to provider [facility] , who verbally acknowledged these results.  IMPRESSION: 5 mm nodular hyperdensity within the posterior  right temporal as described. This may reflect a hyperdense metastasis. A small acute parenchymal hemorrhage cannot be excluded. Mild generalized cerebral atrophy. Multiple known calvarial metastases were better appreciated on the prior brain MRI of 09/27/2017. Right sphenoid sinusitis. Electronically Signed   By: Kellie Simmering DO   On: 03/09/2020 14:58   CT ANGIO NECK W OR WO CONTRAST  Result Date: 03/10/2020 CLINICAL DATA:  Stroke follow-up EXAM: CT ANGIOGRAPHY HEAD AND NECK TECHNIQUE: Multidetector CT imaging of the head and neck was performed using the standard protocol during bolus administration of intravenous contrast. Multiplanar CT image reconstructions and MIPs were obtained to evaluate the vascular anatomy. Carotid stenosis measurements (when applicable) are obtained utilizing NASCET criteria, using the distal internal carotid diameter as the denominator. CONTRAST:  131m OMNIPAQUE IOHEXOL 350 MG/ML SOLN COMPARISON:  Brain MRI 03/09/2020 FINDINGS: CT HEAD FINDINGS Brain: Unchanged punctate focus of blood in the posterior right temporal lobe (7:44). The size and configuration of the ventricles and extra-axial CSF spaces are normal. Skull: Multiple sclerotic calvarial lesions. Sinuses/Orbits: No fluid levels or advanced mucosal thickening of the visualized paranasal sinuses. No mastoid or middle ear effusion. The orbits are normal. CTA NECK FINDINGS SKELETON: Multiple ill-defined sclerotic foci.  No fracture. OTHER NECK: Normal pharynx, larynx and major salivary glands. No cervical lymphadenopathy. Unremarkable thyroid gland. UPPER CHEST: No pneumothorax or pleural effusion. No nodules or masses. AORTIC ARCH: There is no calcific atherosclerosis of the aortic arch. There is no aneurysm, dissection or hemodynamically significant stenosis of the visualized portion of the aorta. Conventional 3 vessel aortic branching pattern. The visualized proximal  subclavian arteries are widely patent. RIGHT CAROTID SYSTEM: No dissection, occlusion or aneurysm. Mild atherosclerotic calcification at the carotid bifurcation without hemodynamically significant stenosis. LEFT CAROTID SYSTEM: No dissection, occlusion or aneurysm. Mild atherosclerotic calcification at the carotid bifurcation without hemodynamically significant stenosis. VERTEBRAL ARTERIES: Left dominant configuration. Both origins are clearly patent. There is no dissection, occlusion or flow-limiting stenosis to the skull base (V1-V3 segments). CTA HEAD FINDINGS POSTERIOR CIRCULATION: --Vertebral arteries: Normal V4 segments. --Inferior cerebellar arteries: Normal. --Basilar artery: Normal. --Superior cerebellar arteries: Normal. --Posterior cerebral arteries (PCA): Normal. ANTERIOR CIRCULATION: --Intracranial internal carotid arteries: Normal. --Anterior cerebral arteries (ACA): Normal. Both A1 segments are present. Patent anterior communicating artery (a-comm). --Middle cerebral arteries (MCA): Normal. VENOUS SINUSES: As permitted by contrast timing, patent. ANATOMIC VARIANTS: None Review of the MIP images confirms the above findings. IMPRESSION: 1. Unchanged punctate focus of blood in the posterior right temporal lobe. 2. No emergent large vessel occlusion or high-grade stenosis of the intracranial or cervical arteries. 3. Mild bilateral carotid bifurcation atherosclerosis without hemodynamically significant stenosis. 4. Multiple ill-defined sclerotic calvarial and vertebral lesions, compatible with known metastatic disease. Electronically Signed   By: KUlyses JarredM.D.   On: 03/10/2020 23:20   CT ANGIO CHEST PE W OR WO CONTRAST  Result Date: 03/11/2020 CLINICAL DATA:  COVID-19 infection, positive D-dimer, stroke and history of metastatic breast carcinoma. EXAM: CT ANGIOGRAPHY CHEST WITH CONTRAST TECHNIQUE: Multidetector CT imaging of the chest was performed using the standard protocol during bolus  administration of intravenous contrast. Multiplanar CT image reconstructions and MIPs were obtained to evaluate the vascular anatomy. CONTRAST:  1068mOMNIPAQUE IOHEXOL 350 MG/ML SOLN COMPARISON:  CT of the chest on 06/08/2018 FINDINGS: Cardiovascular: Central pulmonary artery delineation and opacification is satisfactory and there is no evidence of significant central pulmonary embolism. Peripheral pulmonary arterial opacification and evaluation is severely limited by respiratory motion. The thoracic aorta is well  opacified and demonstrates normal caliber with no evidence of aneurysmal disease or dissection. The heart size is normal. No pericardial fluid identified. No significant calcified coronary artery plaque identified. Mediastinum/Nodes: There are some small scattered mediastinal lymph nodes. The largest measures approximately 10 mm in short axis in the right lower paratracheal region. No overtly enlarged lymph nodes identified. Lungs/Pleura: Lungs demonstrate scattered areas of interstitial prominence bilaterally which were partially present on the prior scan. Some of these areas may represent active COVID pneumonia. Others are likely related to chronic disease and prior radiation therapy. Upper Abdomen: No acute abnormality. Musculoskeletal: Sclerotic metastatic disease involving multiple thoracic vertebral bodies is less prominent compared to the prior CT with some residual areas of mild sclerosis present. No associated pathologic fractures. There is a decrease in size of a chronic seroma of the right lateral chest wall after prior mastectomy. Review of the MIP images confirms the above findings. IMPRESSION: 1. No evidence of significant central pulmonary embolism. Peripheral pulmonary arterial opacification and evaluation is severely limited by respiratory motion. 2. Scattered areas of interstitial prominence bilaterally which were partially present on the prior scan. Some of these areas may represent  active COVID pneumonia. Others are likely related to chronic disease and prior radiation therapy. 3. Decrease in size of a chronic seroma of the right lateral chest wall after prior mastectomy. 4. Decreased conspicuity of sclerotic metastatic disease involving multiple thoracic vertebral bodies. Electronically Signed   By: Aletta Edouard M.D.   On: 03/11/2020 13:26   MR BRAIN WO CONTRAST  Result Date: 03/09/2020 CLINICAL DATA:  Initial evaluation for acute nausea, vomiting, weakness. History of breast cancer. EXAM: MRI HEAD WITHOUT CONTRAST TECHNIQUE: Multiplanar, multiecho pulse sequences of the brain and surrounding structures were obtained without intravenous contrast. COMPARISON:  Prior CT from earlier same day as well as previous brain MRI from 09/27/2017. FINDINGS: Brain: Diffuse prominence of the CSF containing spaces compatible generalized age-related cerebral atrophy. Patchy T2/FLAIR hyperintensity within the periventricular and deep white matter both cerebral hemispheres most consistent with chronic small vessel ischemic disease, mild for age, and relatively stable. 9 mm linear focus of restricted diffusion seen involving the left cerebellum, consistent with an acute ischemic infarct (series 5, image 69). No associated hemorrhage or mass effect. Few additional punctate foci of diffusion abnormality involving the right centrum semi ovale noted as well, which could reflect tiny subacute small vessel type infarcts (series 18, images 45, 41). No associated hemorrhage. Gray-white matter differentiation otherwise maintained. No other areas of chronic cortical infarction. 7 mm focus of susceptibility artifacts seen involving the right temporal lobe, corresponding to prior hyperdensity seen on prior CT (series 15, image 25). This is new as compared to prior MRI from 2019. Subtly increased localized T2/FLAIR signal intensity seen at this location suggesting edema (series 16, image 25). No significant regional  mass effect. Given the history of metastatic breast cancer, finding is most concerning for a possible new intracranial metastasis. No other definite metastatic lesions are seen on this noncontrast examination. No midline shift or mass effect. Ventricles stable in size without hydrocephalus. No extra-axial fluid collection. No made of an empty sella with sellar expansion. Midline structures intact. Vascular: Major intracranial vascular flow voids are maintained. Skull and upper cervical spine: Craniocervical junction within normal limits. Few scattered T1 hypointense lesions noted about the calvarium, consistent with known osseous metastatic disease. No visible extra osseous or soft tissue component. No scalp soft tissue abnormality. Sinuses/Orbits: Patient status post bilateral ocular lens replacement. Globes  and orbital soft tissues demonstrate no acute finding. Right sphenoid sinus disease noted. Paranasal sinuses are otherwise clear. Trace left mastoid effusion noted, of doubtful significance. Inner ear structures grossly normal. Other: None. IMPRESSION: 1. 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 2. Few additional punctate foci of diffusion abnormality involving the right centrum semi ovale, suspicious for tiny subacute nonhemorrhagic small vessel type infarcts. 3. 7 mm focus of susceptibility artifact involving the right temporal lobe, corresponding to hyperdensity seen on prior CT. Subtly increased localized T2/FLAIR signal intensity at this location without significant regional mass effect. Given the history of metastatic breast cancer, finding is most concerning for a possible hemorrhagic metastasis. If possible, further evaluation with postcontrast imaging may be helpful for further evaluation. Alternatively, a short interval follow-up study in 2-3 months to evaluate for interval change may be helpful as well. 4. Scattered T1 hypointense lesions about the calvarium, consistent with known  osseous metastatic disease. 5. Age-related cerebral atrophy with mild chronic small vessel ischemic disease. Electronically Signed   By: Jeannine Boga M.D.   On: 03/09/2020 22:15   MR BRAIN W WO CONTRAST  Result Date: 03/15/2020 CLINICAL DATA:  Possible hemorrhagic metastasis. EXAM: MRI HEAD WITHOUT AND WITH CONTRAST TECHNIQUE: Multiplanar, multiecho pulse sequences of the brain and surrounding structures were obtained without and with intravenous contrast. CONTRAST:  9m GADAVIST GADOBUTROL 1 MMOL/ML IV SOLN COMPARISON:  Head CT 03/10/2020 FINDINGS: Brain: Unchanged appearance of small left cerebellar infarct. There are new foci of abnormal diffusion restriction within the right corona radiata and frontal lobe. Multifocal hyperintense T2-weighted signal within the white matter. Normal volume of CSF spaces. Punctate focus hemorrhage in the right temporal lobe is unchanged. There are no contrast-enhancing lesions. Normal midline structures. Vascular: Normal flow voids. Skull and upper cervical spine: Multiple hypointense T1-weighted signal lesions of the calvarium are unchanged. Sinuses/Orbits: Negative. Other: None. IMPRESSION: 1. Multiple new foci of abnormal diffusion restriction within the right corona radiata and frontal lobe, consistent with acute to subacute infarcts. No hemorrhage or mass effect. 2. 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 3. Unchanged punctate focus of hemorrhage in the right temporal lobe. No abnormal contrast enhancement. Electronically Signed   By: KUlyses JarredM.D.   On: 03/15/2020 00:16   IR Fluoro Guide CV Line Right  Result Date: 03/17/2020 CLINICAL DATA:  COVID-19 infection, streptococcal bacteremia and cerebral infarction. History of bilateral mastectomy. Need for tunneled central venous catheter for long-term IV antibiotic therapy. EXAM: TUNNELED CENTRAL VENOUS CATHETER PLACEMENT WITH ULTRASOUND AND FLUOROSCOPIC GUIDANCE ANESTHESIA/SEDATION: None  MEDICATIONS: None FLUOROSCOPY TIME:  30 seconds. PROCEDURE: The procedure, risks, benefits, and alternatives were explained to the patient. Questions regarding the procedure were encouraged and answered. The patient understands and consents to the procedure. A timeout was performed prior to initiating the procedure. The right neck and chest were prepped with chlorhexidine in a sterile fashion, and a sterile drape was applied covering the operative field. Maximum barrier sterile technique with sterile gowns and gloves were used for the procedure. Local anesthesia was provided with 1% lidocaine. Ultrasound was used to confirm patency of the right internal jugular vein. After creating a small venotomy incision, a 21 gauge needle was advanced into the right internal jugular vein under direct, real-time ultrasound guidance. Ultrasound image documentation was performed. After securing guidewire access, a 6 French peel-away sheath was placed. A wire was kinked to measure appropriate catheter length. A 6 French, dual-lumen power line tunneled central venous catheter was chosen for placement.  This was tunneled in a retrograde fashion from the chest wall to the venotomy incision. The catheter was cut to 23 cm based on guidewire measurement. The catheter was then placed through the sheath and the sheath removed. Final catheter positioning was confirmed and documented with a fluoroscopic spot image. The catheter was aspirated and flushed with saline. The venotomy incision was closed with subcuticular 4-0 Vicryl. Dermabond was applied to the incision. The catheter exit site was secured with Prolene retention sutures. COMPLICATIONS: None.  No pneumothorax. FINDINGS: After catheter placement, the tip lies at the SVC/RA junction. The catheter aspirates normally and is ready for immediate use. IMPRESSION: Placement of tunneled central venous catheter via the right internal jugular vein. The catheter tip lies at the SVC/RA junction.  The catheter is ready for immediate use. Electronically Signed   By: Aletta Edouard M.D.   On: 03/17/2020 17:03   IR US Guide Vasc Access Right  Result Date: 03/17/2020 CLINICAL DATA:  COVID-19 infection, streptococcal bacteremia and cerebral infarction. History of bilateral mastectomy. Need for tunneled central venous catheter for long-term IV antibiotic therapy. EXAM: TUNNELED CENTRAL VENOUS CATHETER PLACEMENT WITH ULTRASOUND AND FLUOROSCOPIC GUIDANCE ANESTHESIA/SEDATION: None MEDICATIONS: None FLUOROSCOPY TIME:  30 seconds. PROCEDURE: The procedure, risks, benefits, and alternatives were explained to the patient. Questions regarding the procedure were encouraged and answered. The patient understands and consents to the procedure. A timeout was performed prior to initiating the procedure. The right neck and chest were prepped with chlorhexidine in a sterile fashion, and a sterile drape was applied covering the operative field. Maximum barrier sterile technique with sterile gowns and gloves were used for the procedure. Local anesthesia was provided with 1% lidocaine. Ultrasound was used to confirm patency of the right internal jugular vein. After creating a small venotomy incision, a 21 gauge needle was advanced into the right internal jugular vein under direct, real-time ultrasound guidance. Ultrasound image documentation was performed. After securing guidewire access, a 6 French peel-away sheath was placed. A wire was kinked to measure appropriate catheter length. A 6 French, dual-lumen power line tunneled central venous catheter was chosen for placement. This was tunneled in a retrograde fashion from the chest wall to the venotomy incision. The catheter was cut to 23 cm based on guidewire measurement. The catheter was then placed through the sheath and the sheath removed. Final catheter positioning was confirmed and documented with a fluoroscopic spot image. The catheter was aspirated and flushed with  saline. The venotomy incision was closed with subcuticular 4-0 Vicryl. Dermabond was applied to the incision. The catheter exit site was secured with Prolene retention sutures. COMPLICATIONS: None.  No pneumothorax. FINDINGS: After catheter placement, the tip lies at the SVC/RA junction. The catheter aspirates normally and is ready for immediate use. IMPRESSION: Placement of tunneled central venous catheter via the right internal jugular vein. The catheter tip lies at the SVC/RA junction. The catheter is ready for immediate use. Electronically Signed   By: Aletta Edouard M.D.   On: 03/17/2020 17:03   DG CHEST PORT 1 VIEW  Result Date: 03/14/2020 CLINICAL DATA:  COVID pneumonia. EXAM: PORTABLE CHEST 1 VIEW COMPARISON:  One-view chest x-ray 03/13/2020 FINDINGS: Heart size exaggerated by low lung volumes. Interstitial and airspace opacities are again noted, greatest in the right upper lobe and left lower lobe. Hood aeration is improving. Degenerative changes are noted at the shoulders. IMPRESSION: Improving aeration with persistent interstitial and airspace disease in the right upper lobe and left lower lobe. Electronically Signed  By: San Morelle M.D.   On: 03/14/2020 06:06   DG CHEST PORT 1 VIEW  Result Date: 03/13/2020 CLINICAL DATA:  Shortness of breath, COVID positive EXAM: PORTABLE CHEST 1 VIEW COMPARISON:  03/10/2020 FINDINGS: Portion of the left lung base is excluded. Slightly increased interstitial prominence. No significant pleural effusion. No pneumothorax. Stable cardiomediastinal contours. IMPRESSION: Slightly increased interstitial prominence, which may reflect worsening edema or pneumonia. Electronically Signed   By: Macy Mis M.D.   On: 03/13/2020 08:51   DG CHEST PORT 1 VIEW  Result Date: 03/10/2020 CLINICAL DATA:  COVID EXAM: PORTABLE CHEST 1 VIEW COMPARISON:  11/10/2019 FINDINGS: Shallow inspiration. Cardiac enlargement. Interstitial changes in the lungs may  represent multifocal pneumonia or edema. No pleural effusions. No pneumothorax. Mediastinal contours appear intact. Degenerative changes in the spine. IMPRESSION: Cardiac enlargement. Interstitial changes in the lungs may represent multifocal pneumonia or edema. Electronically Signed   By: Lucienne Capers M.D.   On: 03/10/2020 04:39   DG Abdomen Acute W/Chest  Result Date: 03/09/2020 CLINICAL DATA:  Metastatic breast cancer.  Weakness. EXAM: DG ABDOMEN ACUTE WITH 1 VIEW CHEST COMPARISON:  Chest radiograph 11/10/2019. Abdominal radiographs 11/24/2008. FINDINGS: The cardiomediastinal silhouette is unchanged. Asymmetric opacity is again noted in the right upper lobe consistent with post radiation changes. The interstitial markings are mildly prominent bilaterally without overt edema. No acute airspace consolidation, sizeable pleural effusion, pneumothorax is identified. No acute osseous abnormality is seen. There is no evidence of intraperitoneal free air. Gas is present in nondilated colon. No dilated loops of bowel are seen to suggest obstruction. IMPRESSION: Nonobstructed bowel gas pattern. Chronic lung changes without evidence of acute cardiopulmonary disease. Electronically Signed   By: Logan Bores M.D.   On: 03/09/2020 14:32   ECHOCARDIOGRAM COMPLETE  Result Date: 03/10/2020    ECHOCARDIOGRAM REPORT   Patient Name:   KENSINGTON RIOS Mo[place] Date of Exam: 03/10/2020 Medical Rec #:  [id number]      Height:       59.0 in Accession #:    [id number]     Weight:       226.0 lb Date of Birth:  17-Jul-1945      BSA:          1.943 m Patient Age:    40 years       BP:           136/68 mmHg Patient Gender: F              HR:           87 bpm. Exam Location:  Inpatient Procedure: 2D Echo, Cardiac Doppler, Color Doppler and Intracardiac            Opacification Agent Indications:   Stroke 434.91 / I163.9  History:       Patient has no prior history of Echocardiogram examinations.                Stroke.  Sonographer:   Bernadene Person RDCS Referring      [place] Phys: IMPRESSIONS  1. Left ventricular ejection fraction, by estimation, is 70 to 75%. The left ventricle has hyperdynamic function. The left ventricle has no regional wall motion abnormalities. Left ventricular diastolic parameters were normal.  2. Right ventricular systolic function is normal. The right ventricular size is normal.  3. The mitral valve is normal in structure. Mild mitral valve regurgitation. No evidence of mitral stenosis.  4. The aortic valve is tricuspid. Aortic  valve regurgitation is mild. Mild aortic valve sclerosis is present, with no evidence of aortic valve stenosis.  5. The inferior vena cava is normal in size with greater than 50% respiratory variability, suggesting right atrial pressure of 3 mmHg. FINDINGS  Left Ventricle: Left ventricular ejection fraction, by estimation, is 70 to 75%. The left ventricle has hyperdynamic function. The left ventricle has no regional wall motion abnormalities. Definity contrast agent was given IV to delineate the left ventricular endocardial borders. The left ventricular internal cavity size was normal in size. There is no left ventricular hypertrophy. Left ventricular diastolic parameters were normal. Normal left ventricular filling pressure. Right Ventricle: The right ventricular size is normal. No increase in right ventricular wall thickness. Right ventricular systolic function is normal. Left Atrium: Left atrial size was normal in size. Right Atrium: Right atrial size was normal in size. Pericardium: There is no evidence of pericardial effusion. Mitral Valve: The mitral valve is normal in structure. Mild mitral valve regurgitation. No evidence of mitral valve stenosis. Tricuspid Valve: The tricuspid valve is normal in structure. Tricuspid valve regurgitation is not demonstrated. No evidence of tricuspid stenosis. Aortic Valve: The aortic valve is tricuspid. Aortic valve regurgitation is mild.  Mild aortic valve sclerosis is present, with no evidence of aortic valve stenosis. Pulmonic Valve: The pulmonic valve was normal in structure. Pulmonic valve regurgitation is not visualized. No evidence of pulmonic stenosis. Aorta: The aortic root is normal in size and structure. Venous: The inferior vena cava is normal in size with greater than 50% respiratory variability, suggesting right atrial pressure of 3 mmHg. IAS/Shunts: No atrial level shunt detected by color flow Doppler.  LEFT VENTRICLE PLAX 2D LVIDd:         4.20 cm  Diastology LVIDs:         2.50 cm  LV e' medial:    9.03 cm/s LV PW:         0.90 cm  LV E/e' medial:  9.3 LV IVS:        0.90 cm  LV e' lateral:   11.00 cm/s LVOT diam:     2.10 cm  LV E/e' lateral: 7.6 LV SV:         108 LV SV Index:   55 LVOT Area:     3.46 cm  RIGHT VENTRICLE RV S prime:     14.20 cm/s TAPSE (M-mode): 1.6 cm LEFT ATRIUM             Index       RIGHT ATRIUM           Index LA diam:        3.70 cm 1.90 cm/m  RA Area:     10.20 cm LA Vol (A2C):   37.9 ml 19.51 ml/m RA Volume:   17.50 ml  9.01 ml/m LA Vol (A4C):   34.4 ml 17.71 ml/m LA Biplane Vol: 38.3 ml 19.71 ml/m  AORTIC VALVE LVOT Vmax:   129.00 cm/s LVOT Vmean:  99.500 cm/s LVOT VTI:    0.311 m  AORTA Ao Root diam: 2.30 cm Ao Asc diam:  3.10 cm MITRAL VALVE                TRICUSPID VALVE MV Area (PHT): 3.31 cm     TR Peak grad:   21.5 mmHg MV Decel Time: 229 msec     TR Vmax:        232.00 cm/s MV E velocity: 83.90 cm/s MV A velocity: 103.00  cm/s  SHUNTS MV E/A ratio:  0.81         Systemic VTI:  0.31 m                             Systemic Diam: 2.10 cm Fransico Him MD Electronically signed by Fransico Him MD Signature Date/Time: 03/10/2020/2:34:49 PM    Final    VAS US CAROTID  Result Date: 03/14/2020 Carotid Arterial Duplex Study Indications: CVA. Limitations  Today's exam was limited due to the patient's respiratory              variation. Performing Technologist: Abram Sander RVS  Examination  Guidelines: A complete evaluation includes B-mode imaging, spectral Doppler, color Doppler, and power Doppler as needed of all accessible portions of each vessel. Bilateral testing is considered an integral part of a complete examination. Limited examinations for reoccurring indications may be performed as noted.  Right Carotid Findings: +----------+--------+--------+--------+------------------+--------+           PSV cm/sEDV cm/sStenosisPlaque DescriptionComments +----------+--------+--------+--------+------------------+--------+ CCA Prox  70      13              heterogenous               +----------+--------+--------+--------+------------------+--------+ CCA Distal75      17              heterogenous               +----------+--------+--------+--------+------------------+--------+ ICA Prox  128     32      1-39%   heterogenous               +----------+--------+--------+--------+------------------+--------+ ICA Distal94      22                                         +----------+--------+--------+--------+------------------+--------+ ECA       55      12                                         +----------+--------+--------+--------+------------------+--------+ +----------+--------+-------+--------+-------------------+           PSV cm/sEDV cmsDescribeArm Pressure (mmHG) +----------+--------+-------+--------+-------------------+ [id number]                                         +----------+--------+-------+--------+-------------------+ +---------+--------+--------+--------------+ VertebralPSV cm/sEDV cm/sNot identified +---------+--------+--------+--------------+  Left Carotid Findings: +----------+--------+--------+--------+------------------+--------+           PSV cm/sEDV cm/sStenosisPlaque DescriptionComments +----------+--------+--------+--------+------------------+--------+ CCA Prox  95                      heterogenous                +----------+--------+--------+--------+------------------+--------+ CCA Distal82      27              heterogenous               +----------+--------+--------+--------+------------------+--------+ ICA Prox  135     38      1-39%   heterogenous               +----------+--------+--------+--------+------------------+--------+ ICA Distal110     37                                         +----------+--------+--------+--------+------------------+--------+  ECA       120     10                                         +----------+--------+--------+--------+------------------+--------+ +----------+--------+--------+--------+-------------------+           PSV cm/sEDV cm/sDescribeArm Pressure (mmHG) +----------+--------+--------+--------+-------------------+ [id number]                                          +----------+--------+--------+--------+-------------------+ +---------+--------+--+--------+--+---------+ VertebralPSV cm/s66EDV cm/s20Antegrade +---------+--------+--+--------+--+---------+   Summary: Right Carotid: Velocities in the right ICA are consistent with a 1-39% stenosis. Left Carotid: Velocities in the left ICA are consistent with a 1-39% stenosis. Vertebrals: Left vertebral artery demonstrates antegrade flow. Right vertebral             artery was not visualized. *See table(s) above for measurements and observations.  Electronically signed by Antony Contras MD on 03/14/2020 at 2:07:34 PM.    Final    VAS [place] LOWER EXTREMITY VENOUS (DVT)  Result Date: 03/10/2020  Lower Venous DVTStudy Indications: Stroke.  Limitations: Pain tolerance. Comparison Study: no prior Performing Technologist: Abram Sander RVS  Examination Guidelines: A complete evaluation includes B-mode imaging, spectral Doppler, color Doppler, and power Doppler as needed of all accessible portions of each vessel. Bilateral testing is considered an integral part of a complete examination. Limited  examinations for reoccurring indications may be performed as noted. The reflux portion of the exam is performed with the patient in reverse Trendelenburg.  +---------+---------------+---------+-----------+----------+--------------+ RIGHT    CompressibilityPhasicitySpontaneityPropertiesThrombus Aging +---------+---------------+---------+-----------+----------+--------------+ CFV      Full           Yes      Yes                                 +---------+---------------+---------+-----------+----------+--------------+ SFJ      Full                                                        +---------+---------------+---------+-----------+----------+--------------+ FV Prox  Full                                                        +---------+---------------+---------+-----------+----------+--------------+ FV Mid                  Yes      Yes                                 +---------+---------------+---------+-----------+----------+--------------+ FV Distal               Yes      Yes                                 +---------+---------------+---------+-----------+----------+--------------+ PFV      Full                                                        +---------+---------------+---------+-----------+----------+--------------+  POP      Full           Yes      Yes                                 +---------+---------------+---------+-----------+----------+--------------+ PTV      Full                                                        +---------+---------------+---------+-----------+----------+--------------+ PERO                                                  Not visualized +---------+---------------+---------+-----------+----------+--------------+   +---------+---------------+---------+-----------+----------+--------------+ LEFT     CompressibilityPhasicitySpontaneityPropertiesThrombus Aging  +---------+---------------+---------+-----------+----------+--------------+ CFV      Full           Yes      Yes                                 +---------+---------------+---------+-----------+----------+--------------+ SFJ      Full                                                        +---------+---------------+---------+-----------+----------+--------------+ FV Prox  Full                                                        +---------+---------------+---------+-----------+----------+--------------+ FV Mid                  Yes      Yes                                 +---------+---------------+---------+-----------+----------+--------------+ FV Distal               Yes      Yes                                 +---------+---------------+---------+-----------+----------+--------------+ PFV      Full                                                        +---------+---------------+---------+-----------+----------+--------------+ POP      Full           Yes      Yes                                 +---------+---------------+---------+-----------+----------+--------------+  PTV      Full                                                        +---------+---------------+---------+-----------+----------+--------------+ PERO                                                  Not visualized +---------+---------------+---------+-----------+----------+--------------+     Summary: BILATERAL: - No evidence of deep vein thrombosis seen in the lower extremities, bilaterally. - No evidence of superficial venous thrombosis in the lower extremities, bilaterally. -   *See table(s) above for measurements and observations. Electronically signed by Servando Snare MD on 03/10/2020 at 1:10:27 PM.    Final    [place] EKG SITE RITE  Result Date: 03/17/2020 If Site Rite image not attached, placement could not be confirmed due to current cardiac rhythm.    ELIGIBLE FOR  AVAILABLE RESEARCH PROTOCOL: no  ASSESSMENT: 74 y.o. 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 Results are negative for pathogenic mutations in 46 genes analyzed by [facility]'s Common Hereditary Cancers Panel. Results are dated 11/26/2016. Genes tested: APC, ATM, AXIN2, BARD1, BMPR1A, BRCA1, BRCA2, BRIP1, CDH1, CDKN2A, CHEK2, CTNNA1, DICER1, EPCAM, GREM1, HOXB13, KIT, MEN1, MLH1, MSH2, MSH3, MSH6, MUTYH, NBN, NF1, NTHL1, PALB2, PDGFRA, PMS2, POLD1, POLE, PTEN, RAD50, RAD51C, RAD51D, SDHA, SDHB, SDHC, SDHD, SMAD4, SMARCA4, STK11, TP53, TSC1, TSC2, and VHL.  (a) A variant of uncertain significance (not clinically actionable) was noted in CTNNA1.    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 There was a 4 cm right breast mass with right axillary lymph nodes and evidence of cirrhosis.  (3) neoadjuvant treatment consisting of cyclophosphamide, methotrexate, and fluorouracil (CMF) chemotherapy every 21 days 6, starting 11/05/2016, last dose 02/18/2017  (4) Bilateral mastectomies on 04/04/2017 left breast: DCIS, 1.2 cm, margins negative, right Breast: IMC predominantly lobular, grade 2, 8 cm, Posterior margin focally positive, 10/10 lymph noes involved.  Both are ER/PR positive, invasive tumor was HER-2 negative (ratio 1.48).  (a) the patient is status post revision of some of the scar irregularities under Dr. Marla Roe 10/28/2018  (5)  adjuvant radiation from 06/02/2017-07/10/2017: 1) Right chest wall/ 50.4 Gy in 28 fractions.  2) L-spine/ 30 Gy in 10 fractions  (6) anastrozole started 09/2017  (a) bone density 12/18/2017 showed a T score of -0.7 normal (although scan quality was limited and lumbar spine was not utilized due to advanced degenerative changes. )  (b)  denosumab/Xgeva restarted on 11/28/17, given monthly; changed to every 6 months after January 2020 dose  (7) staging studies: CT of the chest with contrast and bone scan 06/08/2018 shows stable bony, no visceral disease disease   PLAN:   Follow up instructions:    -Return  to cancer center ***  -Mammogram due in *** -Follow up with surgery ***   The patient was provided an opportunity to ask questions and all were answered. The patient agreed with the plan and demonstrated an understanding of the instructions.   The patient was advised to call back or seek an in-person evaluation if the symptoms worsen or if the condition fails to improve as anticipated.   I provided *** minutes of {Blank single:19197::"face-to-face video visit time","non face-to-face telephone visit time"} during this encounter, and > 50% was spent counseling as documented under my assessment & plan.  Wilber Bihari, NP 04/03/20 12:01 PM Medical Oncology and Hematology [facility] [place], [place] [address] Tel. [phone]    Fax. [phone]     *Total Encounter Time as defined by the Centers for Medicare and Medicaid Services includes, in addition to the face-to-face time of a patient visit (documented in the note above) non-face-to-face time: obtaining and reviewing outside history, ordering and reviewing medications, tests or procedures, care coordination (communications with other health care professionals or caregivers) and documentation in the medical record.

## 2020-04-04 DIAGNOSIS — A419 Sepsis, unspecified organism: Secondary | ICD-10-CM | POA: Diagnosis not present

## 2020-04-05 DIAGNOSIS — A408 Other streptococcal sepsis: Secondary | ICD-10-CM | POA: Diagnosis not present

## 2020-04-05 DIAGNOSIS — I080 Rheumatic disorders of both mitral and aortic valves: Secondary | ICD-10-CM | POA: Diagnosis not present

## 2020-04-05 DIAGNOSIS — Z79899 Other long term (current) drug therapy: Secondary | ICD-10-CM | POA: Diagnosis not present

## 2020-04-05 DIAGNOSIS — D696 Thrombocytopenia, unspecified: Secondary | ICD-10-CM | POA: Diagnosis not present

## 2020-04-05 DIAGNOSIS — D630 Anemia in neoplastic disease: Secondary | ICD-10-CM | POA: Diagnosis not present

## 2020-04-05 DIAGNOSIS — M479 Spondylosis, unspecified: Secondary | ICD-10-CM | POA: Diagnosis not present

## 2020-04-05 DIAGNOSIS — Z170 Estrogen receptor positive status [ER+]: Secondary | ICD-10-CM | POA: Diagnosis not present

## 2020-04-05 DIAGNOSIS — I119 Hypertensive heart disease without heart failure: Secondary | ICD-10-CM | POA: Diagnosis not present

## 2020-04-05 DIAGNOSIS — Z452 Encounter for adjustment and management of vascular access device: Secondary | ICD-10-CM | POA: Diagnosis not present

## 2020-04-05 DIAGNOSIS — Z7982 Long term (current) use of aspirin: Secondary | ICD-10-CM | POA: Diagnosis not present

## 2020-04-05 DIAGNOSIS — Z8673 Personal history of transient ischemic attack (TIA), and cerebral infarction without residual deficits: Secondary | ICD-10-CM | POA: Diagnosis not present

## 2020-04-05 DIAGNOSIS — A419 Sepsis, unspecified organism: Secondary | ICD-10-CM | POA: Diagnosis not present

## 2020-04-05 DIAGNOSIS — J323 Chronic sphenoidal sinusitis: Secondary | ICD-10-CM | POA: Diagnosis not present

## 2020-04-05 DIAGNOSIS — C7951 Secondary malignant neoplasm of bone: Secondary | ICD-10-CM | POA: Diagnosis not present

## 2020-04-05 DIAGNOSIS — G319 Degenerative disease of nervous system, unspecified: Secondary | ICD-10-CM | POA: Diagnosis not present

## 2020-04-05 DIAGNOSIS — E860 Dehydration: Secondary | ICD-10-CM | POA: Diagnosis not present

## 2020-04-05 DIAGNOSIS — Z9013 Acquired absence of bilateral breasts and nipples: Secondary | ICD-10-CM | POA: Diagnosis not present

## 2020-04-05 DIAGNOSIS — Z86010 Personal history of colonic polyps: Secondary | ICD-10-CM | POA: Diagnosis not present

## 2020-04-05 DIAGNOSIS — G4733 Obstructive sleep apnea (adult) (pediatric): Secondary | ICD-10-CM | POA: Diagnosis not present

## 2020-04-05 DIAGNOSIS — E871 Hypo-osmolality and hyponatremia: Secondary | ICD-10-CM | POA: Diagnosis not present

## 2020-04-05 DIAGNOSIS — C50811 Malignant neoplasm of overlapping sites of right female breast: Secondary | ICD-10-CM | POA: Diagnosis not present

## 2020-04-05 DIAGNOSIS — E039 Hypothyroidism, unspecified: Secondary | ICD-10-CM | POA: Diagnosis not present

## 2020-04-06 DIAGNOSIS — A419 Sepsis, unspecified organism: Secondary | ICD-10-CM | POA: Diagnosis not present

## 2020-04-07 DIAGNOSIS — A419 Sepsis, unspecified organism: Secondary | ICD-10-CM | POA: Diagnosis not present

## 2020-04-08 DIAGNOSIS — A419 Sepsis, unspecified organism: Secondary | ICD-10-CM | POA: Diagnosis not present

## 2020-04-09 DIAGNOSIS — A419 Sepsis, unspecified organism: Secondary | ICD-10-CM | POA: Diagnosis not present

## 2020-04-10 DIAGNOSIS — Z9013 Acquired absence of bilateral breasts and nipples: Secondary | ICD-10-CM | POA: Diagnosis not present

## 2020-04-10 DIAGNOSIS — G4733 Obstructive sleep apnea (adult) (pediatric): Secondary | ICD-10-CM | POA: Diagnosis not present

## 2020-04-10 DIAGNOSIS — C7951 Secondary malignant neoplasm of bone: Secondary | ICD-10-CM | POA: Diagnosis not present

## 2020-04-10 DIAGNOSIS — E039 Hypothyroidism, unspecified: Secondary | ICD-10-CM | POA: Diagnosis not present

## 2020-04-10 DIAGNOSIS — I080 Rheumatic disorders of both mitral and aortic valves: Secondary | ICD-10-CM | POA: Diagnosis not present

## 2020-04-10 DIAGNOSIS — E871 Hypo-osmolality and hyponatremia: Secondary | ICD-10-CM | POA: Diagnosis not present

## 2020-04-10 DIAGNOSIS — Z7982 Long term (current) use of aspirin: Secondary | ICD-10-CM | POA: Diagnosis not present

## 2020-04-10 DIAGNOSIS — Z452 Encounter for adjustment and management of vascular access device: Secondary | ICD-10-CM | POA: Diagnosis not present

## 2020-04-10 DIAGNOSIS — A419 Sepsis, unspecified organism: Secondary | ICD-10-CM | POA: Diagnosis not present

## 2020-04-10 DIAGNOSIS — Z8673 Personal history of transient ischemic attack (TIA), and cerebral infarction without residual deficits: Secondary | ICD-10-CM | POA: Diagnosis not present

## 2020-04-10 DIAGNOSIS — A408 Other streptococcal sepsis: Secondary | ICD-10-CM | POA: Diagnosis not present

## 2020-04-10 DIAGNOSIS — Z79899 Other long term (current) drug therapy: Secondary | ICD-10-CM | POA: Diagnosis not present

## 2020-04-10 DIAGNOSIS — C50811 Malignant neoplasm of overlapping sites of right female breast: Secondary | ICD-10-CM | POA: Diagnosis not present

## 2020-04-10 DIAGNOSIS — D696 Thrombocytopenia, unspecified: Secondary | ICD-10-CM | POA: Diagnosis not present

## 2020-04-10 DIAGNOSIS — E860 Dehydration: Secondary | ICD-10-CM | POA: Diagnosis not present

## 2020-04-10 DIAGNOSIS — Z86010 Personal history of colonic polyps: Secondary | ICD-10-CM | POA: Diagnosis not present

## 2020-04-10 DIAGNOSIS — Z170 Estrogen receptor positive status [ER+]: Secondary | ICD-10-CM | POA: Diagnosis not present

## 2020-04-10 DIAGNOSIS — J323 Chronic sphenoidal sinusitis: Secondary | ICD-10-CM | POA: Diagnosis not present

## 2020-04-10 DIAGNOSIS — I119 Hypertensive heart disease without heart failure: Secondary | ICD-10-CM | POA: Diagnosis not present

## 2020-04-10 DIAGNOSIS — D630 Anemia in neoplastic disease: Secondary | ICD-10-CM | POA: Diagnosis not present

## 2020-04-10 DIAGNOSIS — M479 Spondylosis, unspecified: Secondary | ICD-10-CM | POA: Diagnosis not present

## 2020-04-10 DIAGNOSIS — G319 Degenerative disease of nervous system, unspecified: Secondary | ICD-10-CM | POA: Diagnosis not present

## 2020-04-11 ENCOUNTER — Telehealth: Payer: Self-pay

## 2020-04-11 DIAGNOSIS — A419 Sepsis, unspecified organism: Secondary | ICD-10-CM | POA: Diagnosis not present

## 2020-04-11 NOTE — Telephone Encounter (Signed)
Verbal order received per Dr. Baxter Flattery End IV therapy on 04/13/2020. Patient to maintain tunneled picc for oncology treatment.  Verbal order understood and ready by Tim with [facility]. Jasmine Hood

## 2020-04-17 DIAGNOSIS — Z170 Estrogen receptor positive status [ER+]: Secondary | ICD-10-CM | POA: Diagnosis not present

## 2020-04-17 DIAGNOSIS — A408 Other streptococcal sepsis: Secondary | ICD-10-CM | POA: Diagnosis not present

## 2020-04-17 DIAGNOSIS — Z86010 Personal history of colonic polyps: Secondary | ICD-10-CM | POA: Diagnosis not present

## 2020-04-17 DIAGNOSIS — M479 Spondylosis, unspecified: Secondary | ICD-10-CM | POA: Diagnosis not present

## 2020-04-17 DIAGNOSIS — G319 Degenerative disease of nervous system, unspecified: Secondary | ICD-10-CM | POA: Diagnosis not present

## 2020-04-17 DIAGNOSIS — G4733 Obstructive sleep apnea (adult) (pediatric): Secondary | ICD-10-CM | POA: Diagnosis not present

## 2020-04-17 DIAGNOSIS — Z7982 Long term (current) use of aspirin: Secondary | ICD-10-CM | POA: Diagnosis not present

## 2020-04-17 DIAGNOSIS — D696 Thrombocytopenia, unspecified: Secondary | ICD-10-CM | POA: Diagnosis not present

## 2020-04-17 DIAGNOSIS — Z79899 Other long term (current) drug therapy: Secondary | ICD-10-CM | POA: Diagnosis not present

## 2020-04-17 DIAGNOSIS — E039 Hypothyroidism, unspecified: Secondary | ICD-10-CM | POA: Diagnosis not present

## 2020-04-17 DIAGNOSIS — E860 Dehydration: Secondary | ICD-10-CM | POA: Diagnosis not present

## 2020-04-17 DIAGNOSIS — C50811 Malignant neoplasm of overlapping sites of right female breast: Secondary | ICD-10-CM | POA: Diagnosis not present

## 2020-04-17 DIAGNOSIS — D630 Anemia in neoplastic disease: Secondary | ICD-10-CM | POA: Diagnosis not present

## 2020-04-17 DIAGNOSIS — Z452 Encounter for adjustment and management of vascular access device: Secondary | ICD-10-CM | POA: Diagnosis not present

## 2020-04-17 DIAGNOSIS — C7951 Secondary malignant neoplasm of bone: Secondary | ICD-10-CM | POA: Diagnosis not present

## 2020-04-17 DIAGNOSIS — I080 Rheumatic disorders of both mitral and aortic valves: Secondary | ICD-10-CM | POA: Diagnosis not present

## 2020-04-17 DIAGNOSIS — J323 Chronic sphenoidal sinusitis: Secondary | ICD-10-CM | POA: Diagnosis not present

## 2020-04-17 DIAGNOSIS — Z9013 Acquired absence of bilateral breasts and nipples: Secondary | ICD-10-CM | POA: Diagnosis not present

## 2020-04-17 DIAGNOSIS — I119 Hypertensive heart disease without heart failure: Secondary | ICD-10-CM | POA: Diagnosis not present

## 2020-04-17 DIAGNOSIS — E871 Hypo-osmolality and hyponatremia: Secondary | ICD-10-CM | POA: Diagnosis not present

## 2020-04-17 DIAGNOSIS — Z8673 Personal history of transient ischemic attack (TIA), and cerebral infarction without residual deficits: Secondary | ICD-10-CM | POA: Diagnosis not present

## 2020-04-18 ENCOUNTER — Other Ambulatory Visit: Payer: Self-pay

## 2020-04-18 ENCOUNTER — Telehealth: Payer: Self-pay

## 2020-04-18 ENCOUNTER — Encounter: Payer: Self-pay | Admitting: Internal Medicine

## 2020-04-18 ENCOUNTER — Telehealth (INDEPENDENT_AMBULATORY_CARE_PROVIDER_SITE_OTHER): Payer: Medicare Other | Admitting: Internal Medicine

## 2020-04-18 DIAGNOSIS — R7881 Bacteremia: Secondary | ICD-10-CM

## 2020-04-18 DIAGNOSIS — I63319 Cerebral infarction due to thrombosis of unspecified middle cerebral artery: Secondary | ICD-10-CM

## 2020-04-18 DIAGNOSIS — B955 Unspecified streptococcus as the cause of diseases classified elsewhere: Secondary | ICD-10-CM

## 2020-04-18 NOTE — Progress Notes (Signed)
Virtual Visit via Telephone Note  I connected with Jasmine Hood on 04/18/20 at  3:00 PM EST by telephone and verified that I am speaking with the correct person using two identifiers.  Location: Patient: at home Provider: in clinic   I discussed the limitations, risks, security and privacy concerns of performing an evaluation and management service by telephone and the availability of in person appointments. I also discussed with the patient that there may be a patient responsible charge related to this service. The patient expressed understanding and agreed to proceed.   History of Present Illness:  [name] is a 74yo F who was hospitalized for AMS found to have COVID with CNS emboli thought to be due to hypercoagulable state associated with covid however she was also found to have streptococcal mitis bacteremia. She was treated with 4 wk of penicillin for complicated Streptococcal bacteremia, since there was concern that she may have endocarditis with septic emboli that would explain CNS findings, with multiple infarcts. Not clear cut since she had declined TEE for confirmation. COVID-19 can also predispose person for hypercoagulability. The fact that she had ongoing bacteremia over 2 days would favor infectious causes of CNS findings.  Observations/Objective: Still doing physical therapy.   Assessment and Plan: Has finished iv abtx course. But will need Remove tunneled line by radiology  Follow Up Instructions: Tunneled catheter removal by IR will need to be arranged; plan on patient to doing daily flush per protocol   I discussed the assessment and treatment plan with the patient. The patient was provided an opportunity to ask questions and all were answered. The patient agreed with the plan and demonstrated an understanding of the instructions.   The patient was advised to call back or seek an in-person evaluation if the symptoms worsen or if the condition fails to improve as  anticipated.  I provided 15 minutes of non-face-to-face time during this encounter.   Carlyle Basques, MD

## 2020-04-18 NOTE — Telephone Encounter (Addendum)
I called Anderson Malta with IR to schedule patient to have Tunneled CVC removed per Dr. Baxter Flattery. Anderson Malta did not answer and I left a voicemail for her to call back. Charlayne Vultaggio T Brooks Sailors

## 2020-04-19 ENCOUNTER — Telehealth: Payer: Self-pay

## 2020-04-19 NOTE — Telephone Encounter (Signed)
Patient called to state she is out of saline flushes for her line. Nursing staff currently working on scheduling tunneled CVC removal per Dr. Baxter Flattery. RN left voicemail for [employer] in [employer] requesting callback to schedule removal.  RN spoke with Jackelyn Poling at [facility] to request extra flushes be provided to patient and that line care be continued until removal can be scheduled.   RN called patient to make her aware that [facility] will be bringing more flushes and caring for her CVC until removal is scheduled.   Beryle Flock, RN

## 2020-04-20 ENCOUNTER — Ambulatory Visit (HOSPITAL_COMMUNITY)
Admission: RE | Admit: 2020-04-20 | Discharge: 2020-04-20 | Disposition: A | Discharge status: Home / Self Care | Payer: Medicare Other | Source: Ambulatory Visit | Attending: Internal Medicine | Admitting: Internal Medicine

## 2020-04-20 ENCOUNTER — Other Ambulatory Visit: Payer: Self-pay

## 2020-04-20 DIAGNOSIS — Z86010 Personal history of colonic polyps: Secondary | ICD-10-CM | POA: Diagnosis not present

## 2020-04-20 DIAGNOSIS — E871 Hypo-osmolality and hyponatremia: Secondary | ICD-10-CM | POA: Diagnosis not present

## 2020-04-20 DIAGNOSIS — R7881 Bacteremia: Secondary | ICD-10-CM | POA: Insufficient documentation

## 2020-04-20 DIAGNOSIS — M479 Spondylosis, unspecified: Secondary | ICD-10-CM | POA: Diagnosis not present

## 2020-04-20 DIAGNOSIS — B955 Unspecified streptococcus as the cause of diseases classified elsewhere: Secondary | ICD-10-CM | POA: Insufficient documentation

## 2020-04-20 DIAGNOSIS — Z452 Encounter for adjustment and management of vascular access device: Secondary | ICD-10-CM | POA: Diagnosis not present

## 2020-04-20 DIAGNOSIS — C50811 Malignant neoplasm of overlapping sites of right female breast: Secondary | ICD-10-CM | POA: Diagnosis not present

## 2020-04-20 DIAGNOSIS — E039 Hypothyroidism, unspecified: Secondary | ICD-10-CM | POA: Diagnosis not present

## 2020-04-20 DIAGNOSIS — Z4901 Encounter for fitting and adjustment of extracorporeal dialysis catheter: Secondary | ICD-10-CM | POA: Diagnosis not present

## 2020-04-20 DIAGNOSIS — E860 Dehydration: Secondary | ICD-10-CM | POA: Diagnosis not present

## 2020-04-20 DIAGNOSIS — A408 Other streptococcal sepsis: Secondary | ICD-10-CM | POA: Diagnosis not present

## 2020-04-20 DIAGNOSIS — I119 Hypertensive heart disease without heart failure: Secondary | ICD-10-CM | POA: Diagnosis not present

## 2020-04-20 DIAGNOSIS — Z170 Estrogen receptor positive status [ER+]: Secondary | ICD-10-CM | POA: Diagnosis not present

## 2020-04-20 DIAGNOSIS — Z79899 Other long term (current) drug therapy: Secondary | ICD-10-CM | POA: Diagnosis not present

## 2020-04-20 DIAGNOSIS — D630 Anemia in neoplastic disease: Secondary | ICD-10-CM | POA: Diagnosis not present

## 2020-04-20 DIAGNOSIS — J323 Chronic sphenoidal sinusitis: Secondary | ICD-10-CM | POA: Diagnosis not present

## 2020-04-20 DIAGNOSIS — G4733 Obstructive sleep apnea (adult) (pediatric): Secondary | ICD-10-CM | POA: Diagnosis not present

## 2020-04-20 DIAGNOSIS — Z8673 Personal history of transient ischemic attack (TIA), and cerebral infarction without residual deficits: Secondary | ICD-10-CM | POA: Diagnosis not present

## 2020-04-20 DIAGNOSIS — C7951 Secondary malignant neoplasm of bone: Secondary | ICD-10-CM | POA: Diagnosis not present

## 2020-04-20 DIAGNOSIS — I080 Rheumatic disorders of both mitral and aortic valves: Secondary | ICD-10-CM | POA: Diagnosis not present

## 2020-04-20 DIAGNOSIS — Z9013 Acquired absence of bilateral breasts and nipples: Secondary | ICD-10-CM | POA: Diagnosis not present

## 2020-04-20 DIAGNOSIS — D696 Thrombocytopenia, unspecified: Secondary | ICD-10-CM | POA: Diagnosis not present

## 2020-04-20 DIAGNOSIS — G319 Degenerative disease of nervous system, unspecified: Secondary | ICD-10-CM | POA: Diagnosis not present

## 2020-04-20 DIAGNOSIS — Z7982 Long term (current) use of aspirin: Secondary | ICD-10-CM | POA: Diagnosis not present

## 2020-04-20 HISTORY — PX: IR REMOVAL TUN CV CATH W/O FL: IMG2289

## 2020-04-20 MED ORDER — LIDOCAINE HCL 1 % IJ SOLN
INTRAMUSCULAR | Status: AC
  Filled 2020-04-20: qty 20

## 2020-04-20 NOTE — Telephone Encounter (Signed)
Patient scheduled for 04/26/20 arrival time 1:30 pm. Patient and patient's daughter verbalized understanding of appointment date,time and location. Rori Goar T Brooks Sailors

## 2020-04-20 NOTE — Telephone Encounter (Signed)
I spoke with Anderson Malta with IR and she has scheduled patient to have CVC removed on 04/26/20 and arrival time 1:30 pm at radiology. Pt and patient's daughter have been informed of time, date and location. Patient and daughter verbalized understanding.  Patient's daughter also informed that the RN Jinny Blossom) has reached out to [facility] to get extra flushes sent out to the patient.  Oz Gammel T Brooks Sailors

## 2020-04-21 ENCOUNTER — Telehealth: Payer: Self-pay | Admitting: Internal Medicine

## 2020-04-21 ENCOUNTER — Telehealth: Payer: Self-pay

## 2020-04-21 NOTE — Telephone Encounter (Signed)
Jasmine Hood would like the nurse o give her a call about the patient; please call [phone]

## 2020-04-21 NOTE — Telephone Encounter (Signed)
Jasmine Hood is calling because the patient pulled out her central line and had to go to the ED.  She wanted to drive herself.  Jasmine Hood feels as though she should not be driving.  She also does not feel she is safe at home by herself.  Jasmine Hood will be making an home visit Monday and will call back with an updated reported.

## 2020-04-21 NOTE — Telephone Encounter (Signed)
Received call from [place] with [facility] RN that patient was instructed to go to ED for CVC removal due to dressing being off for roughly 3 days. Per chart, there wasn't a visit made. Unable to contact patient or daughter due to phone being disconnected. MyChart is not activated. Returned Diane's call who informed RN that her ex-husband took her to the ED yesterday and the line has been removed. Unable to confirm at the moment. Will notify provider.   Nhung Danko Lorita Officer, RN

## 2020-04-24 ENCOUNTER — Ambulatory Visit (HOSPITAL_COMMUNITY): Payer: Medicare Other

## 2020-04-24 ENCOUNTER — Telehealth: Payer: Self-pay | Admitting: Internal Medicine

## 2020-04-24 DIAGNOSIS — G4733 Obstructive sleep apnea (adult) (pediatric): Secondary | ICD-10-CM | POA: Diagnosis not present

## 2020-04-24 DIAGNOSIS — J323 Chronic sphenoidal sinusitis: Secondary | ICD-10-CM | POA: Diagnosis not present

## 2020-04-24 DIAGNOSIS — E871 Hypo-osmolality and hyponatremia: Secondary | ICD-10-CM | POA: Diagnosis not present

## 2020-04-24 DIAGNOSIS — C50811 Malignant neoplasm of overlapping sites of right female breast: Secondary | ICD-10-CM | POA: Diagnosis not present

## 2020-04-24 DIAGNOSIS — A408 Other streptococcal sepsis: Secondary | ICD-10-CM | POA: Diagnosis not present

## 2020-04-24 DIAGNOSIS — Z7982 Long term (current) use of aspirin: Secondary | ICD-10-CM | POA: Diagnosis not present

## 2020-04-24 DIAGNOSIS — D630 Anemia in neoplastic disease: Secondary | ICD-10-CM | POA: Diagnosis not present

## 2020-04-24 DIAGNOSIS — D696 Thrombocytopenia, unspecified: Secondary | ICD-10-CM | POA: Diagnosis not present

## 2020-04-24 DIAGNOSIS — I080 Rheumatic disorders of both mitral and aortic valves: Secondary | ICD-10-CM | POA: Diagnosis not present

## 2020-04-24 DIAGNOSIS — Z452 Encounter for adjustment and management of vascular access device: Secondary | ICD-10-CM | POA: Diagnosis not present

## 2020-04-24 DIAGNOSIS — E039 Hypothyroidism, unspecified: Secondary | ICD-10-CM | POA: Diagnosis not present

## 2020-04-24 DIAGNOSIS — C7951 Secondary malignant neoplasm of bone: Secondary | ICD-10-CM | POA: Diagnosis not present

## 2020-04-24 DIAGNOSIS — I119 Hypertensive heart disease without heart failure: Secondary | ICD-10-CM | POA: Diagnosis not present

## 2020-04-24 DIAGNOSIS — Z86010 Personal history of colonic polyps: Secondary | ICD-10-CM | POA: Diagnosis not present

## 2020-04-24 DIAGNOSIS — Z79899 Other long term (current) drug therapy: Secondary | ICD-10-CM | POA: Diagnosis not present

## 2020-04-24 DIAGNOSIS — G319 Degenerative disease of nervous system, unspecified: Secondary | ICD-10-CM | POA: Diagnosis not present

## 2020-04-24 DIAGNOSIS — M479 Spondylosis, unspecified: Secondary | ICD-10-CM | POA: Diagnosis not present

## 2020-04-24 DIAGNOSIS — Z8673 Personal history of transient ischemic attack (TIA), and cerebral infarction without residual deficits: Secondary | ICD-10-CM | POA: Diagnosis not present

## 2020-04-24 DIAGNOSIS — Z170 Estrogen receptor positive status [ER+]: Secondary | ICD-10-CM | POA: Diagnosis not present

## 2020-04-24 DIAGNOSIS — E860 Dehydration: Secondary | ICD-10-CM | POA: Diagnosis not present

## 2020-04-24 DIAGNOSIS — Z9013 Acquired absence of bilateral breasts and nipples: Secondary | ICD-10-CM | POA: Diagnosis not present

## 2020-04-24 NOTE — Telephone Encounter (Signed)
Per Mallie Mussel said she needs a F2F  for the patient's hospital bed and mattress, and she needs a refill on all of her medication.

## 2020-04-24 NOTE — Progress Notes (Signed)
Verbal orders given by MD for continuation of PT 1 day a week X 3 weeks.  Mel at [facility] notified.

## 2020-04-25 ENCOUNTER — Other Ambulatory Visit: Payer: Self-pay | Admitting: Internal Medicine

## 2020-04-25 DIAGNOSIS — Z170 Estrogen receptor positive status [ER+]: Secondary | ICD-10-CM

## 2020-04-25 DIAGNOSIS — C50811 Malignant neoplasm of overlapping sites of right female breast: Secondary | ICD-10-CM

## 2020-04-25 MED ORDER — ANASTROZOLE 1 MG PO TABS: 1.0000 mg | ORAL_TABLET | Freq: Every day | ORAL | 0 refills | Status: DC

## 2020-04-25 MED ORDER — POLYETHYLENE GLYCOL 3350 17 G PO PACK
17.0000 g | PACK | Freq: Two times a day (BID) | ORAL | 1 refills | Status: DC
Start: 2020-04-25 — End: 2020-05-17

## 2020-04-25 MED ORDER — LEVOTHYROXINE SODIUM 25 MCG PO TABS: 25.0000 ug | ORAL_TABLET | Freq: Every day | ORAL | 0 refills | Status: DC

## 2020-04-25 MED ORDER — PANTOPRAZOLE SODIUM 40 MG PO TBEC: 40.0000 mg | DELAYED_RELEASE_TABLET | Freq: Every day | ORAL | 0 refills | Status: DC

## 2020-04-25 NOTE — Telephone Encounter (Signed)
Refills sent

## 2020-04-25 NOTE — Telephone Encounter (Signed)
anastrozole (ARIMIDEX) 1 MG tablet   levothyroxine (SYNTHROID) 25 MCG tablet(Expired)  pantoprazole (PROTONIX) 40 MG tablet(Expired)  polyethylene glycol (MIRALAX / GLYCOLAX) 17 g packet  [id number] - [place], [place] Phone:  [phone]  Fax:  [phone]

## 2020-04-25 NOTE — Telephone Encounter (Signed)
Attempted to call patient to schedule an in office visit for F2F.  Unable to leave a message due to mailbox not being set up yet.  Refill sent in a different phone message.

## 2020-04-26 ENCOUNTER — Ambulatory Visit (HOSPITAL_COMMUNITY): Payer: Medicare Other

## 2020-04-27 NOTE — Telephone Encounter (Signed)
2nd attempt to call patient but unable to leave a message.

## 2020-05-01 DIAGNOSIS — C7951 Secondary malignant neoplasm of bone: Secondary | ICD-10-CM | POA: Diagnosis not present

## 2020-05-01 DIAGNOSIS — Z79899 Other long term (current) drug therapy: Secondary | ICD-10-CM | POA: Diagnosis not present

## 2020-05-01 DIAGNOSIS — G319 Degenerative disease of nervous system, unspecified: Secondary | ICD-10-CM | POA: Diagnosis not present

## 2020-05-01 DIAGNOSIS — E871 Hypo-osmolality and hyponatremia: Secondary | ICD-10-CM | POA: Diagnosis not present

## 2020-05-01 DIAGNOSIS — A408 Other streptococcal sepsis: Secondary | ICD-10-CM | POA: Diagnosis not present

## 2020-05-01 DIAGNOSIS — E039 Hypothyroidism, unspecified: Secondary | ICD-10-CM | POA: Diagnosis not present

## 2020-05-01 DIAGNOSIS — Z452 Encounter for adjustment and management of vascular access device: Secondary | ICD-10-CM | POA: Diagnosis not present

## 2020-05-01 DIAGNOSIS — C50811 Malignant neoplasm of overlapping sites of right female breast: Secondary | ICD-10-CM | POA: Diagnosis not present

## 2020-05-01 DIAGNOSIS — D630 Anemia in neoplastic disease: Secondary | ICD-10-CM | POA: Diagnosis not present

## 2020-05-01 DIAGNOSIS — Z170 Estrogen receptor positive status [ER+]: Secondary | ICD-10-CM | POA: Diagnosis not present

## 2020-05-01 DIAGNOSIS — D696 Thrombocytopenia, unspecified: Secondary | ICD-10-CM | POA: Diagnosis not present

## 2020-05-01 DIAGNOSIS — Z9013 Acquired absence of bilateral breasts and nipples: Secondary | ICD-10-CM | POA: Diagnosis not present

## 2020-05-01 DIAGNOSIS — G4733 Obstructive sleep apnea (adult) (pediatric): Secondary | ICD-10-CM | POA: Diagnosis not present

## 2020-05-01 DIAGNOSIS — I119 Hypertensive heart disease without heart failure: Secondary | ICD-10-CM | POA: Diagnosis not present

## 2020-05-01 DIAGNOSIS — Z86010 Personal history of colonic polyps: Secondary | ICD-10-CM | POA: Diagnosis not present

## 2020-05-01 DIAGNOSIS — Z8673 Personal history of transient ischemic attack (TIA), and cerebral infarction without residual deficits: Secondary | ICD-10-CM | POA: Diagnosis not present

## 2020-05-01 DIAGNOSIS — J323 Chronic sphenoidal sinusitis: Secondary | ICD-10-CM | POA: Diagnosis not present

## 2020-05-01 DIAGNOSIS — I080 Rheumatic disorders of both mitral and aortic valves: Secondary | ICD-10-CM | POA: Diagnosis not present

## 2020-05-01 DIAGNOSIS — E860 Dehydration: Secondary | ICD-10-CM | POA: Diagnosis not present

## 2020-05-01 DIAGNOSIS — M479 Spondylosis, unspecified: Secondary | ICD-10-CM | POA: Diagnosis not present

## 2020-05-01 DIAGNOSIS — Z7982 Long term (current) use of aspirin: Secondary | ICD-10-CM | POA: Diagnosis not present

## 2020-05-01 NOTE — Telephone Encounter (Signed)
Spoke with patient and an appointment scheduled 

## 2020-05-04 ENCOUNTER — Ambulatory Visit (INDEPENDENT_AMBULATORY_CARE_PROVIDER_SITE_OTHER): Payer: Medicare Other | Admitting: Internal Medicine

## 2020-05-04 ENCOUNTER — Other Ambulatory Visit: Payer: Self-pay | Admitting: Internal Medicine

## 2020-05-04 ENCOUNTER — Other Ambulatory Visit: Payer: Self-pay

## 2020-05-04 VITALS — BP 110/60 | HR 125 | Temp 98.6°F | Ht 59.0 in | Wt 212.1 lb

## 2020-05-04 DIAGNOSIS — Z853 Personal history of malignant neoplasm of breast: Secondary | ICD-10-CM | POA: Diagnosis not present

## 2020-05-04 DIAGNOSIS — C7951 Secondary malignant neoplasm of bone: Secondary | ICD-10-CM

## 2020-05-04 DIAGNOSIS — I63319 Cerebral infarction due to thrombosis of unspecified middle cerebral artery: Secondary | ICD-10-CM

## 2020-05-04 DIAGNOSIS — Z09 Encounter for follow-up examination after completed treatment for conditions other than malignant neoplasm: Secondary | ICD-10-CM | POA: Diagnosis not present

## 2020-05-04 DIAGNOSIS — B955 Unspecified streptococcus as the cause of diseases classified elsewhere: Secondary | ICD-10-CM

## 2020-05-04 DIAGNOSIS — R531 Weakness: Secondary | ICD-10-CM

## 2020-05-04 DIAGNOSIS — R7881 Bacteremia: Secondary | ICD-10-CM

## 2020-05-04 DIAGNOSIS — U071 COVID-19: Secondary | ICD-10-CM | POA: Diagnosis not present

## 2020-05-04 NOTE — Progress Notes (Signed)
Established Patient Office Visit     This visit occurred during the SARS-CoV-2 public health emergency.  Safety protocols were in place, including screening questions prior to the visit, additional usage of staff PPE, and extensive cleaning of exam room while observing appropriate contact time as indicated for disinfecting solutions.    CC/Reason for Visit: Follow-up chronic conditions, needs durable medical equipment  HPI: Jasmine Hood is a 74 y.o. female who is coming in today for the above mentioned reasons.  She was hospitalized in October for altered mental status, found to have Covid with CNS emboli as well as streptococcal mitis bacteremia.  She was treated with 4 weeks of IV penicillin sister was concerned that she may have had endocarditis with septic emboli.  This is not clear as she declined TEE for confirmation.  It is also possible that she had hypercoagulability due to COVID-19.  She has a history of metastatic breast cancer followed by Dr. [name] that is currently on anastrozole.  She has been released from home health RN care, tells me that she has a couple days left of physical therapy.  She is requesting a new hospital bed mattress as hers is "worn out.  She has a sore on her bottom and wonders what she can put on it but does not want me to look at it today.   Past Medical/Surgical History: Past Medical History:  Diagnosis Date  . Breast cancer ([place]) 10/07/2016   right breast, spine  . Brown recluse spider bite    right leg  . COLONIC POLYPS, HX OF 01/05/2009   Qualifier: Diagnosis of  By: [name]  MD, [name]   . Complication of anesthesia    slow to awaken after wisdom  teeth extraction age 30  . Constipation   . Genetic testing 11/27/2016   Ms. Dileo underwent genetic counseling and testing for hereditary cancer syndromes on 11/19/2016. Her results were negative for mutations in all 46 genes analyzed by [facility]'s 46-gene Common Hereditary Cancers Panel.  Genes analyzed include: APC, ATM, AXIN2, BARD1, BMPR1A, BRCA1, BRCA2, BRIP1, CDH1, CDKN2A, CHEK2, CTNNA1, DICER1, EPCAM, GREM1, HOXB13, KIT, MEN1, MLH1, MSH2, MSH3, MSH6, MUTYH, NBN,  . Hypothyroidism   . Morbid obesity ([place]) 01/05/2009   Qualifier: Diagnosis of  By: [name]  MD, [name]   . Pneumonia 1987  . Sleep apnea    no cpap yet, has not recieved results from home test yet  . Stroke [facility])    found on CT scan, no deficits    Past Surgical History:  Procedure Laterality Date  . BILATERAL TOTAL MASTECTOMY WITH AXILLARY LYMPH NODE DISSECTION Bilateral 04/04/2017   Procedure: RIGHT MASTECTOMY WITH TARGETED RIGHT AXILLARY NODE DISSECTION AND LEFT PROPHYLATIC MASTECTOMY ERAS PATHWAY;  Surgeon: Alphonsa Overall, MD;  Location: [place];  Service: General;  Laterality: Bilateral;  PEC BLOCK  . BREAST BIOPSY Right 10/07/2016   invasive mammary carcinoma  . BREAST BIOPSY Left 10/08/2016   breast fibroadenoma no malignancy  . BREAST REDUCTION SURGERY Bilateral 10/28/2018   Procedure: Excision of bilateral excess breast tissue;  Surgeon: [name], DO;  Location: [place];  Service: Plastics;  Laterality: Bilateral;  2 hours, please.  Marland Kitchen CATARACT EXTRACTION Bilateral   . COLONOSCOPY W/ BIOPSIES AND POLYPECTOMY  2012  . COLONOSCOPY WITH PROPOFOL N/A 12/22/2015   Procedure: COLONOSCOPY WITH PROPOFOL;  Surgeon: [name], MD;  Location: [facility];  Service: Endoscopy;  Laterality: N/A;  . INCISION AND  DRAINAGE Right 1998   surgery for spider bite on RLE; "[name]"  . IR FLUORO GUIDE CV LINE RIGHT  03/17/2020  . IR REMOVAL TUN CV CATH W/O FL  04/20/2020  . IR US GUIDE VASC ACCESS RIGHT  03/17/2020  . MASTECTOMY Left 04/04/2017   [name]  . MASTECTOMY WITH AXILLARY LYMPH NODE DISSECTION Right 04/04/2017  . MOLE REMOVAL  10/29/2016   Procedure: MOLE REMOVAL LEFT CHEST;  Surgeon: Alphonsa Overall, MD;  Location: [facility];  Service: General;;  . PORT-A-CATH  REMOVAL Left 08/05/2018   Procedure: REMOVAL PORT-A-CATH;  Surgeon: [name], DO;  Location: [place];  Service: Plastics;  Laterality: Left;  . PORTACATH PLACEMENT N/A 10/29/2016   Procedure: INSERTION PORT-A-CATH;  Surgeon: Alphonsa Overall, MD;  Location: [facility];  Service: General;  Laterality: N/A;  . TUBAL LIGATION    . WISDOM TOOTH EXTRACTION  age 53    Social History:  reports that she has never smoked. She has never used smokeless tobacco. She reports that she does not drink alcohol and does not use drugs.  Allergies: Allergies  Allergen Reactions  . Bee Venom Anaphylaxis  . Latex Swelling    Family History:  Family History  Problem Relation Age of Onset  . Breast cancer Mother 38       d.52 from metastatic disease  . Throat cancer Father        d.65 history of smoking  . Rheum arthritis Sister        3 sisters pos. for osteo and RA  . Diabetes Maternal Aunt   . Cancer Maternal Grandfather        mouth cancer-chewed tobacco  . Breast cancer Cousin 71  . Breast cancer Daughter 49  . Stomach cancer Sister 51     Current Outpatient Medications:  .  anastrozole (ARIMIDEX) 1 MG tablet, Take 1 tablet (1 mg total) by mouth daily., Disp: 90 tablet, Rfl: 0 .  levothyroxine (SYNTHROID) 25 MCG tablet, Take 1 tablet (25 mcg total) by mouth daily at 6 (six) AM., Disp: 90 tablet, Rfl: 0 .  pantoprazole (PROTONIX) 40 MG tablet, Take 1 tablet (40 mg total) by mouth daily. (Patient not taking: Reported on 05/04/2020), Disp: 90 tablet, Rfl: 0 .  polyethylene glycol (MIRALAX / GLYCOLAX) 17 g packet, Take 17 g by mouth 2 (two) times daily. (Patient not taking: Reported on 05/04/2020), Disp: 14 each, Rfl: 1  Review of Systems:  Constitutional: Denies fever, chills, diaphoresis, appetite change and fatigue.  HEENT: Denies photophobia, eye pain, redness, hearing loss, ear pain, congestion, sore throat, rhinorrhea, sneezing, mouth sores, trouble swallowing, neck  pain, neck stiffness and tinnitus.   Respiratory: Denies SOB, DOE, cough, chest tightness,  and wheezing.   Cardiovascular: Denies chest pain, palpitations and leg swelling.  Gastrointestinal: Denies nausea, vomiting, abdominal pain, diarrhea, constipation, blood in stool and abdominal distention.  Genitourinary: Denies dysuria, urgency, frequency, hematuria, flank pain and difficulty urinating.  Endocrine: Denies: hot or cold intolerance, sweats, changes in hair or nails, polyuria, polydipsia. Musculoskeletal: Denies myalgias, back pain, joint swelling, arthralgias and gait problem.  Skin: Denies pallor, rash and wound.  Neurological: Denies dizziness, seizures, syncope, weakness, light-headedness, numbness and headaches.  Hematological: Denies adenopathy. Easy bruising, personal or family bleeding history  Psychiatric/Behavioral: Denies suicidal ideation, mood changes, confusion, nervousness, sleep disturbance and agitation    Physical Exam: Vitals:   05/04/20 1540  BP: 110/60  Pulse: (!) 125  Temp: 98.6 F (  37 C)  TempSrc: Oral  SpO2: 95%  Weight: 212 lb 1.6 oz (96.2 kg)  Height: _0  (1.499 m)    Body mass index is 42.84 kg/m.   Constitutional: NAD, calm, comfortable, obese, she has a benign essential tremor Eyes: PERRL, lids and conjunctivae normal ENMT: Mucous membranes are moist. Respiratory: clear to auscultation bilaterally, no wheezing, no crackles. Normal respiratory effort. No accessory muscle use.  Cardiovascular: Regular rate and rhythm, no murmurs / rubs / gallops. No extremity edema.  Psychiatric: Normal judgment and insight. Alert and oriented x 3. Normal mood.    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  She has a  "sore spot on her bottom", she has refused examination today.  I have asked her to apply baby diaper cream as a barrier protectant, however I do not know if this is a pressure ulcer or a yeast infection.  She is requesting a hospital mattress today which we will request.  She has not seen Dr. [name] since her hospital discharge.  Time spent: 33 minutes reviewing multiple hospital records, examining patient and discussing plan of care.      Lelon Frohlich, MD [facility]

## 2020-05-05 ENCOUNTER — Emergency Department (HOSPITAL_COMMUNITY): Payer: Medicare Other

## 2020-05-05 ENCOUNTER — Inpatient Hospital Stay (HOSPITAL_COMMUNITY)
Admission: EM | Admit: 2020-05-05 | Discharge: 2020-05-17 | DRG: 871 | Disposition: A | Discharge status: Hospice | Payer: Medicare Other | Attending: Internal Medicine | Admitting: Internal Medicine

## 2020-05-05 DIAGNOSIS — G4733 Obstructive sleep apnea (adult) (pediatric): Secondary | ICD-10-CM | POA: Diagnosis present

## 2020-05-05 DIAGNOSIS — D696 Thrombocytopenia, unspecified: Secondary | ICD-10-CM | POA: Diagnosis not present

## 2020-05-05 DIAGNOSIS — Z515 Encounter for palliative care: Secondary | ICD-10-CM

## 2020-05-05 DIAGNOSIS — E871 Hypo-osmolality and hyponatremia: Secondary | ICD-10-CM | POA: Diagnosis not present

## 2020-05-05 DIAGNOSIS — Z20822 Contact with and (suspected) exposure to covid-19: Secondary | ICD-10-CM | POA: Diagnosis present

## 2020-05-05 DIAGNOSIS — I959 Hypotension, unspecified: Secondary | ICD-10-CM | POA: Diagnosis not present

## 2020-05-05 DIAGNOSIS — J90 Pleural effusion, not elsewhere classified: Secondary | ICD-10-CM | POA: Diagnosis not present

## 2020-05-05 DIAGNOSIS — Z6841 Body Mass Index (BMI) 40.0 and over, adult: Secondary | ICD-10-CM | POA: Diagnosis not present

## 2020-05-05 DIAGNOSIS — A4159 Other Gram-negative sepsis: Secondary | ICD-10-CM | POA: Diagnosis not present

## 2020-05-05 DIAGNOSIS — Z043 Encounter for examination and observation following other accident: Secondary | ICD-10-CM | POA: Diagnosis not present

## 2020-05-05 DIAGNOSIS — C799 Secondary malignant neoplasm of unspecified site: Secondary | ICD-10-CM | POA: Diagnosis not present

## 2020-05-05 DIAGNOSIS — R29810 Facial weakness: Secondary | ICD-10-CM | POA: Diagnosis not present

## 2020-05-05 DIAGNOSIS — Z853 Personal history of malignant neoplasm of breast: Secondary | ICD-10-CM | POA: Diagnosis not present

## 2020-05-05 DIAGNOSIS — N170 Acute kidney failure with tubular necrosis: Secondary | ICD-10-CM | POA: Diagnosis present

## 2020-05-05 DIAGNOSIS — I351 Nonrheumatic aortic (valve) insufficiency: Secondary | ICD-10-CM | POA: Diagnosis not present

## 2020-05-05 DIAGNOSIS — Z803 Family history of malignant neoplasm of breast: Secondary | ICD-10-CM

## 2020-05-05 DIAGNOSIS — N3090 Cystitis, unspecified without hematuria: Secondary | ICD-10-CM | POA: Diagnosis present

## 2020-05-05 DIAGNOSIS — J811 Chronic pulmonary edema: Secondary | ICD-10-CM | POA: Diagnosis present

## 2020-05-05 DIAGNOSIS — Z9013 Acquired absence of bilateral breasts and nipples: Secondary | ICD-10-CM | POA: Diagnosis not present

## 2020-05-05 DIAGNOSIS — N390 Urinary tract infection, site not specified: Secondary | ICD-10-CM | POA: Diagnosis present

## 2020-05-05 DIAGNOSIS — D62 Acute posthemorrhagic anemia: Secondary | ICD-10-CM | POA: Diagnosis not present

## 2020-05-05 DIAGNOSIS — Z9842 Cataract extraction status, left eye: Secondary | ICD-10-CM

## 2020-05-05 DIAGNOSIS — N280 Ischemia and infarction of kidney: Secondary | ICD-10-CM | POA: Diagnosis present

## 2020-05-05 DIAGNOSIS — S40021A Contusion of right upper arm, initial encounter: Secondary | ICD-10-CM | POA: Diagnosis not present

## 2020-05-05 DIAGNOSIS — R739 Hyperglycemia, unspecified: Secondary | ICD-10-CM | POA: Diagnosis not present

## 2020-05-05 DIAGNOSIS — T82898A Other specified complication of vascular prosthetic devices, implants and grafts, initial encounter: Secondary | ICD-10-CM | POA: Diagnosis not present

## 2020-05-05 DIAGNOSIS — Z800 Family history of malignant neoplasm of digestive organs: Secondary | ICD-10-CM

## 2020-05-05 DIAGNOSIS — W1830XA Fall on same level, unspecified, initial encounter: Secondary | ICD-10-CM | POA: Diagnosis present

## 2020-05-05 DIAGNOSIS — D539 Nutritional anemia, unspecified: Secondary | ICD-10-CM | POA: Diagnosis not present

## 2020-05-05 DIAGNOSIS — G4700 Insomnia, unspecified: Secondary | ICD-10-CM | POA: Diagnosis present

## 2020-05-05 DIAGNOSIS — R062 Wheezing: Secondary | ICD-10-CM | POA: Diagnosis not present

## 2020-05-05 DIAGNOSIS — R109 Unspecified abdominal pain: Secondary | ICD-10-CM | POA: Diagnosis not present

## 2020-05-05 DIAGNOSIS — Z8719 Personal history of other diseases of the digestive system: Secondary | ICD-10-CM

## 2020-05-05 DIAGNOSIS — Y92239 Unspecified place in hospital as the place of occurrence of the external cause: Secondary | ICD-10-CM | POA: Diagnosis not present

## 2020-05-05 DIAGNOSIS — I10 Essential (primary) hypertension: Secondary | ICD-10-CM | POA: Diagnosis present

## 2020-05-05 DIAGNOSIS — Z66 Do not resuscitate: Secondary | ICD-10-CM | POA: Diagnosis not present

## 2020-05-05 DIAGNOSIS — M47816 Spondylosis without myelopathy or radiculopathy, lumbar region: Secondary | ICD-10-CM | POA: Diagnosis present

## 2020-05-05 DIAGNOSIS — R079 Chest pain, unspecified: Secondary | ICD-10-CM | POA: Diagnosis not present

## 2020-05-05 DIAGNOSIS — E872 Acidosis: Secondary | ICD-10-CM | POA: Diagnosis not present

## 2020-05-05 DIAGNOSIS — S0990XA Unspecified injury of head, initial encounter: Secondary | ICD-10-CM | POA: Diagnosis not present

## 2020-05-05 DIAGNOSIS — R0603 Acute respiratory distress: Secondary | ICD-10-CM | POA: Diagnosis not present

## 2020-05-05 DIAGNOSIS — Z91040 Latex allergy status: Secondary | ICD-10-CM

## 2020-05-05 DIAGNOSIS — N179 Acute kidney failure, unspecified: Secondary | ICD-10-CM

## 2020-05-05 DIAGNOSIS — R6521 Severe sepsis with septic shock: Secondary | ICD-10-CM | POA: Diagnosis present

## 2020-05-05 DIAGNOSIS — Y828 Other medical devices associated with adverse incidents: Secondary | ICD-10-CM | POA: Diagnosis not present

## 2020-05-05 DIAGNOSIS — E039 Hypothyroidism, unspecified: Secondary | ICD-10-CM | POA: Diagnosis not present

## 2020-05-05 DIAGNOSIS — Z87440 Personal history of urinary (tract) infections: Secondary | ICD-10-CM

## 2020-05-05 DIAGNOSIS — Z7189 Other specified counseling: Secondary | ICD-10-CM | POA: Diagnosis not present

## 2020-05-05 DIAGNOSIS — K7460 Unspecified cirrhosis of liver: Secondary | ICD-10-CM | POA: Diagnosis not present

## 2020-05-05 DIAGNOSIS — Z91030 Bee allergy status: Secondary | ICD-10-CM

## 2020-05-05 DIAGNOSIS — I361 Nonrheumatic tricuspid (valve) insufficiency: Secondary | ICD-10-CM | POA: Diagnosis not present

## 2020-05-05 DIAGNOSIS — R7881 Bacteremia: Secondary | ICD-10-CM | POA: Diagnosis not present

## 2020-05-05 DIAGNOSIS — Z8261 Family history of arthritis: Secondary | ICD-10-CM

## 2020-05-05 DIAGNOSIS — Z8616 Personal history of COVID-19: Secondary | ICD-10-CM

## 2020-05-05 DIAGNOSIS — Z833 Family history of diabetes mellitus: Secondary | ICD-10-CM

## 2020-05-05 DIAGNOSIS — Z9841 Cataract extraction status, right eye: Secondary | ICD-10-CM

## 2020-05-05 DIAGNOSIS — R0600 Dyspnea, unspecified: Secondary | ICD-10-CM | POA: Diagnosis not present

## 2020-05-05 DIAGNOSIS — C50919 Malignant neoplasm of unspecified site of unspecified female breast: Secondary | ICD-10-CM | POA: Diagnosis not present

## 2020-05-05 DIAGNOSIS — R7401 Elevation of levels of liver transaminase levels: Secondary | ICD-10-CM | POA: Diagnosis present

## 2020-05-05 DIAGNOSIS — Z79890 Hormone replacement therapy (postmenopausal): Secondary | ICD-10-CM

## 2020-05-05 DIAGNOSIS — I340 Nonrheumatic mitral (valve) insufficiency: Secondary | ICD-10-CM | POA: Diagnosis not present

## 2020-05-05 DIAGNOSIS — B957 Other staphylococcus as the cause of diseases classified elsewhere: Secondary | ICD-10-CM | POA: Diagnosis present

## 2020-05-05 DIAGNOSIS — G8929 Other chronic pain: Secondary | ICD-10-CM | POA: Diagnosis present

## 2020-05-05 DIAGNOSIS — R300 Dysuria: Secondary | ICD-10-CM | POA: Diagnosis not present

## 2020-05-05 DIAGNOSIS — R0902 Hypoxemia: Secondary | ICD-10-CM | POA: Diagnosis not present

## 2020-05-05 DIAGNOSIS — R404 Transient alteration of awareness: Secondary | ICD-10-CM | POA: Diagnosis not present

## 2020-05-05 DIAGNOSIS — Z9181 History of falling: Secondary | ICD-10-CM

## 2020-05-05 DIAGNOSIS — A419 Sepsis, unspecified organism: Secondary | ICD-10-CM | POA: Diagnosis not present

## 2020-05-05 DIAGNOSIS — R5381 Other malaise: Secondary | ICD-10-CM | POA: Diagnosis not present

## 2020-05-05 DIAGNOSIS — Z170 Estrogen receptor positive status [ER+]: Secondary | ICD-10-CM

## 2020-05-05 DIAGNOSIS — Z8673 Personal history of transient ischemic attack (TIA), and cerebral infarction without residual deficits: Secondary | ICD-10-CM

## 2020-05-05 DIAGNOSIS — J9691 Respiratory failure, unspecified with hypoxia: Secondary | ICD-10-CM | POA: Diagnosis not present

## 2020-05-05 DIAGNOSIS — Z79899 Other long term (current) drug therapy: Secondary | ICD-10-CM

## 2020-05-05 DIAGNOSIS — R0602 Shortness of breath: Secondary | ICD-10-CM | POA: Diagnosis not present

## 2020-05-05 DIAGNOSIS — R6889 Other general symptoms and signs: Secondary | ICD-10-CM | POA: Diagnosis not present

## 2020-05-05 DIAGNOSIS — M7989 Other specified soft tissue disorders: Secondary | ICD-10-CM | POA: Diagnosis not present

## 2020-05-05 DIAGNOSIS — I517 Cardiomegaly: Secondary | ICD-10-CM | POA: Diagnosis not present

## 2020-05-05 DIAGNOSIS — C50511 Malignant neoplasm of lower-outer quadrant of right female breast: Secondary | ICD-10-CM | POA: Diagnosis present

## 2020-05-05 DIAGNOSIS — I499 Cardiac arrhythmia, unspecified: Secondary | ICD-10-CM | POA: Diagnosis not present

## 2020-05-05 DIAGNOSIS — F32A Depression, unspecified: Secondary | ICD-10-CM | POA: Diagnosis present

## 2020-05-05 DIAGNOSIS — Z743 Need for continuous supervision: Secondary | ICD-10-CM | POA: Diagnosis not present

## 2020-05-05 DIAGNOSIS — Z808 Family history of malignant neoplasm of other organs or systems: Secondary | ICD-10-CM

## 2020-05-05 LAB — LACTIC ACID, PLASMA
Lactic Acid, Venous: 8.3 mmol/L (ref 0.5–1.9)
Lactic Acid, Venous: 7.8 mmol/L (ref 0.5–1.9)
Lactic Acid, Venous: 4 mmol/L (ref 0.5–1.9)

## 2020-05-05 LAB — CBC WITH DIFFERENTIAL/PLATELET
Abs Immature Granulocytes: 0 10*3/uL (ref 0.00–0.07)
Basophils Absolute: 0 10*3/uL (ref 0.0–0.1)
Basophils Relative: 0 %
Eosinophils Absolute: 0 10*3/uL (ref 0.0–0.5)
Eosinophils Relative: 0 %
HCT: 30.5 % — ABNORMAL LOW (ref 36.0–46.0)
Hemoglobin: 9.3 g/dL — ABNORMAL LOW (ref 12.0–15.0)
Lymphocytes Relative: 3 %
Lymphs Abs: 0.5 10*3/uL — ABNORMAL LOW (ref 0.7–4.0)
MCH: 31.4 pg (ref 26.0–34.0)
MCHC: 30.5 g/dL (ref 30.0–36.0)
MCV: 103 fL — ABNORMAL HIGH (ref 80.0–100.0)
Monocytes Absolute: 0.5 10*3/uL (ref 0.1–1.0)
Monocytes Relative: 3 %
Neutro Abs: 14.9 10*3/uL — ABNORMAL HIGH (ref 1.7–7.7)
Neutrophils Relative %: 94 %
Platelets: 130 10*3/uL — ABNORMAL LOW (ref 150–400)
RBC: 2.96 MIL/uL — ABNORMAL LOW (ref 3.87–5.11)
RDW: 15.9 % — ABNORMAL HIGH (ref 11.5–15.5)
WBC: 15.8 10*3/uL — ABNORMAL HIGH (ref 4.0–10.5)
nRBC: 0 % (ref 0.0–0.2)
nRBC: 0 /100 WBC

## 2020-05-05 LAB — BASIC METABOLIC PANEL
Anion gap: 13 (ref 5–15)
BUN: 13 mg/dL (ref 8–23)
CO2: 14 mmol/L — ABNORMAL LOW (ref 22–32)
Calcium: 6.1 mg/dL — CL (ref 8.9–10.3)
Chloride: 109 mmol/L (ref 98–111)
Creatinine, Ser: 0.84 mg/dL (ref 0.44–1.00)
GFR, Estimated: >60 mL/min (ref >60)
Glucose, Bld: 178 mg/dL — ABNORMAL HIGH (ref 70–99)
Potassium: 3.5 mmol/L (ref 3.5–5.1)
Sodium: 136 mmol/L (ref 135–145)

## 2020-05-05 LAB — URINALYSIS, ROUTINE W REFLEX MICROSCOPIC
Bilirubin Urine: NEGATIVE
Glucose, UA: NEGATIVE mg/dL
Ketones, ur: NEGATIVE mg/dL
Nitrite: NEGATIVE
Protein, ur: NEGATIVE mg/dL
Specific Gravity, Urine: 1.045 — ABNORMAL HIGH (ref 1.005–1.030)
WBC, UA: >50 WBC/hpf — ABNORMAL HIGH (ref 0–5)
pH: 5 (ref 5.0–8.0)

## 2020-05-05 LAB — CBG MONITORING, ED
Glucose-Capillary: 216 mg/dL — ABNORMAL HIGH (ref 70–99)
Glucose-Capillary: 200 mg/dL — ABNORMAL HIGH (ref 70–99)

## 2020-05-05 LAB — COMPREHENSIVE METABOLIC PANEL
ALT: 37 U/L (ref 0–44)
AST: 109 U/L — ABNORMAL HIGH (ref 15–41)
Albumin: 2.1 g/dL — ABNORMAL LOW (ref 3.5–5.0)
Alkaline Phosphatase: 110 U/L (ref 38–126)
Anion gap: 15 (ref 5–15)
BUN: 14 mg/dL (ref 8–23)
CO2: 15 mmol/L — ABNORMAL LOW (ref 22–32)
Calcium: 8 mg/dL — ABNORMAL LOW (ref 8.9–10.3)
Chloride: 101 mmol/L (ref 98–111)
Creatinine, Ser: 1.36 mg/dL — ABNORMAL HIGH (ref 0.44–1.00)
GFR, Estimated: 40 mL/min — ABNORMAL LOW (ref >60)
Glucose, Bld: 239 mg/dL — ABNORMAL HIGH (ref 70–99)
Potassium: 4.9 mmol/L (ref 3.5–5.1)
Sodium: 131 mmol/L — ABNORMAL LOW (ref 135–145)
Total Bilirubin: 1.2 mg/dL (ref 0.3–1.2)
Total Protein: 6.8 g/dL (ref 6.5–8.1)

## 2020-05-05 LAB — POCT I-STAT 7, (LYTES, BLD GAS, ICA,H+H)
Acid-base deficit: 7 mmol/L — ABNORMAL HIGH (ref 0.0–2.0)
Bicarbonate: 17.2 mmol/L — ABNORMAL LOW (ref 20.0–28.0)
Calcium, Ion: 1.14 mmol/L — ABNORMAL LOW (ref 1.15–1.40)
HCT: 23 % — ABNORMAL LOW (ref 36.0–46.0)
Hemoglobin: 7.8 g/dL — ABNORMAL LOW (ref 12.0–15.0)
O2 Saturation: 99 %
Patient temperature: 97.9
Potassium: 4 mmol/L (ref 3.5–5.1)
Sodium: 137 mmol/L (ref 135–145)
TCO2: 18 mmol/L — ABNORMAL LOW (ref 22–32)
pCO2 arterial: 28.3 mmHg — ABNORMAL LOW (ref 32.0–48.0)
pH, Arterial: 7.389 (ref 7.350–7.450)
pO2, Arterial: 114 mmHg — ABNORMAL HIGH (ref 83.0–108.0)

## 2020-05-05 LAB — TROPONIN I (HIGH SENSITIVITY)
Troponin I (High Sensitivity): 381 ng/L (ref <18)
Troponin I (High Sensitivity): 317 ng/L (ref <18)

## 2020-05-05 LAB — BRAIN NATRIURETIC PEPTIDE: B Natriuretic Peptide: 1073.6 pg/mL — ABNORMAL HIGH (ref 0.0–100.0)

## 2020-05-05 LAB — GLUCOSE, CAPILLARY: Glucose-Capillary: 148 mg/dL — ABNORMAL HIGH (ref 70–99)

## 2020-05-05 LAB — PROTIME-INR
INR: 1.4 — ABNORMAL HIGH (ref 0.8–1.2)
Prothrombin Time: 16.2 seconds — ABNORMAL HIGH (ref 11.4–15.2)

## 2020-05-05 LAB — CK: Total CK: 45 U/L (ref 38–234)

## 2020-05-05 LAB — APTT: aPTT: 35 seconds (ref 24–36)

## 2020-05-05 MED ORDER — VANCOMYCIN HCL 1250 MG/250ML IV SOLN: 1250.0000 mg | INTRAVENOUS | Status: DC

## 2020-05-05 MED ORDER — INSULIN ASPART 100 UNIT/ML ~~LOC~~ SOLN
1.0000 [IU] | SUBCUTANEOUS | Status: DC
  Administered 2020-05-05 – 2020-05-06 (×2): 1 [IU] via SUBCUTANEOUS
  Administered 2020-05-06: 2 [IU] via SUBCUTANEOUS
  Administered 2020-05-06: 3 [IU] via SUBCUTANEOUS
  Administered 2020-05-06 (×2): 2 [IU] via SUBCUTANEOUS
  Administered 2020-05-06 – 2020-05-07 (×5): 1 [IU] via SUBCUTANEOUS
  Administered 2020-05-07: 2 [IU] via SUBCUTANEOUS
  Administered 2020-05-07 – 2020-05-08 (×2): 1 [IU] via SUBCUTANEOUS
  Administered 2020-05-08 (×2): 2 [IU] via SUBCUTANEOUS
  Administered 2020-05-09: 1 [IU] via SUBCUTANEOUS
  Administered 2020-05-09 – 2020-05-10 (×6): 2 [IU] via SUBCUTANEOUS

## 2020-05-05 MED ORDER — SODIUM CHLORIDE 0.9 % IV SOLN
2.0000 g | Freq: Two times a day (BID) | INTRAVENOUS | Status: DC
  Administered 2020-05-06: 2 g via INTRAVENOUS
  Filled 2020-05-05: qty 2

## 2020-05-05 MED ORDER — CALCIUM GLUCONATE-NACL 1-0.675 GM/50ML-% IV SOLN
1.0000 g | Freq: Once | INTRAVENOUS | Status: AC
  Administered 2020-05-05: 1000 mg via INTRAVENOUS
  Filled 2020-05-05: qty 50

## 2020-05-05 MED ORDER — POLYETHYLENE GLYCOL 3350 17 G PO PACK: 17.0000 g | PACK | Freq: Every day | ORAL | Status: DC | PRN

## 2020-05-05 MED ORDER — CHLORHEXIDINE GLUCONATE CLOTH 2 % EX PADS
6.0000 | MEDICATED_PAD | Freq: Every day | CUTANEOUS | Status: DC
  Administered 2020-05-05 – 2020-05-11 (×6): 6 via TOPICAL

## 2020-05-05 MED ORDER — PANTOPRAZOLE SODIUM 40 MG PO TBEC
40.0000 mg | DELAYED_RELEASE_TABLET | Freq: Every day | ORAL | Status: DC
  Administered 2020-05-05 – 2020-05-14 (×10): 40 mg via ORAL
  Filled 2020-05-05 (×10): qty 1

## 2020-05-05 MED ORDER — LACTATED RINGERS IV SOLN: INTRAVENOUS | Status: DC

## 2020-05-05 MED ORDER — IOHEXOL 350 MG/ML SOLN
80.0000 mL | Freq: Once | INTRAVENOUS | Status: AC | PRN
  Administered 2020-05-05: 80 mL via INTRAVENOUS

## 2020-05-05 MED ORDER — HEPARIN (PORCINE) 25000 UT/250ML-% IV SOLN
1500.0000 [IU]/h | INTRAVENOUS | Status: DC
  Administered 2020-05-05: 1000 [IU]/h via INTRAVENOUS
  Administered 2020-05-07: 1500 [IU]/h via INTRAVENOUS
  Filled 2020-05-05 (×3): qty 250

## 2020-05-05 MED ORDER — DOCUSATE SODIUM 100 MG PO CAPS
100.0000 mg | ORAL_CAPSULE | Freq: Two times a day (BID) | ORAL | Status: DC | PRN
  Administered 2020-05-13: 100 mg via ORAL
  Filled 2020-05-05: qty 1

## 2020-05-05 MED ORDER — VANCOMYCIN HCL IN DEXTROSE 1-5 GM/200ML-% IV SOLN
1000.0000 mg | Freq: Once | INTRAVENOUS | Status: DC
Start: 2020-05-05 — End: 2020-05-05

## 2020-05-05 MED ORDER — ANASTROZOLE 1 MG PO TABS
1.0000 mg | ORAL_TABLET | Freq: Every day | ORAL | Status: DC
  Administered 2020-05-06 – 2020-05-16 (×11): 1 mg via ORAL
  Filled 2020-05-05 (×12): qty 1

## 2020-05-05 MED ORDER — ACETAMINOPHEN 325 MG PO TABS
650.0000 mg | ORAL_TABLET | Freq: Once | ORAL | Status: AC
  Administered 2020-05-05: 650 mg via ORAL
  Filled 2020-05-05: qty 2

## 2020-05-05 MED ORDER — LEVOTHYROXINE SODIUM 25 MCG PO TABS
25.0000 ug | ORAL_TABLET | Freq: Every day | ORAL | Status: DC
  Administered 2020-05-06 – 2020-05-14 (×9): 25 ug via ORAL
  Filled 2020-05-05 (×9): qty 1

## 2020-05-05 MED ORDER — HEPARIN BOLUS VIA INFUSION
4000.0000 [IU] | Freq: Once | INTRAVENOUS | Status: AC
  Administered 2020-05-05: 4000 [IU] via INTRAVENOUS
  Filled 2020-05-05: qty 4000

## 2020-05-05 MED ORDER — SODIUM CHLORIDE 0.9 % IV BOLUS (SEPSIS)
1000.0000 mL | Freq: Once | INTRAVENOUS | Status: AC
  Administered 2020-05-05: 1000 mL via INTRAVENOUS

## 2020-05-05 MED ORDER — METRONIDAZOLE IN NACL 5-0.79 MG/ML-% IV SOLN
500.0000 mg | Freq: Once | INTRAVENOUS | Status: AC
  Administered 2020-05-05: 500 mg via INTRAVENOUS
  Filled 2020-05-05: qty 100

## 2020-05-05 MED ORDER — SODIUM CHLORIDE 0.9 % IV SOLN
2.0000 g | Freq: Once | INTRAVENOUS | Status: AC
  Administered 2020-05-05: 2 g via INTRAVENOUS
  Filled 2020-05-05: qty 2

## 2020-05-05 MED ORDER — VANCOMYCIN HCL 2000 MG/400ML IV SOLN
2000.0000 mg | Freq: Once | INTRAVENOUS | Status: AC
  Administered 2020-05-05: 2000 mg via INTRAVENOUS
  Filled 2020-05-05: qty 400

## 2020-05-05 MED ORDER — ALBUTEROL SULFATE (2.5 MG/3ML) 0.083% IN NEBU
2.5000 mg | INHALATION_SOLUTION | Freq: Four times a day (QID) | RESPIRATORY_TRACT | Status: DC | PRN
  Administered 2020-05-06: 2.5 mg via RESPIRATORY_TRACT
  Filled 2020-05-05 (×2): qty 3

## 2020-05-05 MED ORDER — SODIUM CHLORIDE 0.9 % IV BOLUS
1000.0000 mL | Freq: Once | INTRAVENOUS | Status: AC
  Administered 2020-05-05: 1000 mL via INTRAVENOUS

## 2020-05-05 NOTE — Progress Notes (Signed)
Pt arrived to 3M03 at 2130. Report received from [place]. Pt is AO x4 with no c/o pain. Pt has tachypnea with some audible wheezes. Pt skin assessed and pt has healing, blanchable sores on medial sacrum and left buttock. Mepilex applied. VSS. Attempted to call pt's daughter, [name], but sent to voicemail. Will continue to monitor.

## 2020-05-05 NOTE — ED Notes (Signed)
Date and time results received: 05/05/20 1510   Test: lactate  Critical Value: 8.3  Name of Provider Notified: Couture PA-C  Orders Received? Or Actions Taken

## 2020-05-05 NOTE — Sepsis Progress Note (Signed)
Notified bedside nurse of need to draw blood cultures.  

## 2020-05-05 NOTE — ED Notes (Signed)
Patient transported to CT 

## 2020-05-05 NOTE — ED Provider Notes (Signed)
Medical screening examination/treatment/procedure(s) were conducted as a shared visit with non-physician practitioner(s) and myself.  I personally evaluated the patient during the encounter. Briefly, the patient is a 74 y.o. female with recent history of Covid with bacteremia and CNS emboli, breast cancer history on maintenance therapy who presents to the ED with weakness, shortness of breath.  Patient tachypneic, mildly tachycardic.  Oral temperature normal.  Denies any chest pain.  No signs of obvious volume overload on exam.  EKG shows sinus rhythm.  Denies abdominal pain, fever.  Does have nonproductive cough.  Differential is wide including PE, pleural effusion, pneumonia.  Seems less likely cardiac but will check troponin.  Will pursue infectious work-up but hold on antibiotics at this time given no fever.  Will check a rectal temperature.  Anticipate admission.  She denies any melena or hematochezia will check hemoglobin.  Overall exam is unremarkable.  Difficult to assess her breath sounds due to body habitus.  Seems diminished on the right compared to the left.  This chart was dictated using voice recognition software.  Despite best efforts to proofread,  errors can occur which can change the documentation meaning.     EKG Interpretation  Date/Time:  Friday May 05 2020 13:48:32 EST Ventricular Rate:  101 PR Interval:    QRS Duration: 92 QT Interval:  420 QTC Calculation: 545 R Axis:   -9 Text Interpretation: Sinus or ectopic atrial tachycardia Low voltage, precordial leads Confirmed by Lennice Sites [phone]) on 05/05/2020 2:16:33 PM           Lennice Sites, DO 05/05/20 1433

## 2020-05-05 NOTE — Progress Notes (Signed)
[place] for Heparin Indication: Renal infarct   Allergies  Allergen Reactions  . Bee Venom Anaphylaxis  . Latex Swelling    Patient Measurements:   Heparin Dosing Weight: 66.7 kg  Vital Signs: Temp: 98.3 F (36.8 C) (12/17 1714) Temp Source: Oral (12/17 1714) BP: 131/54 (12/17 1900) Pulse Rate: 96 (12/17 1900)  Labs: Recent Labs    05/05/20 1436 05/05/20 1525 05/05/20 1540  HGB 9.3*  --   --   HCT 30.5*  --   --   PLT 130*  --   --   APTT  --  35  --   LABPROT  --  16.2*  --   INR  --  1.4*  --   CREATININE 1.36*  --   --   CKTOTAL 45  --   --   TROPONINIHS 381*  --  317*    Estimated Creatinine Clearance: 35.8 mL/min (A) (by C-G formula based on SCr of 1.36 mg/dL (H)).   Medical History: Past Medical History:  Diagnosis Date  . Breast cancer ([place]) 10/07/2016   right breast, spine  . Brown recluse spider bite    right leg  . COLONIC POLYPS, HX OF 01/05/2009   Qualifier: Diagnosis of  By: [name]  MD, [name]   . Complication of anesthesia    slow to awaken after wisdom  teeth extraction age 58  . Constipation   . Genetic testing 11/27/2016   Ms. Amparo underwent genetic counseling and testing for hereditary cancer syndromes on 11/19/2016. Her results were negative for mutations in all 46 genes analyzed by [facility]'s 46-gene Common Hereditary Cancers Panel. Genes analyzed include: APC, ATM, AXIN2, BARD1, BMPR1A, BRCA1, BRCA2, BRIP1, CDH1, CDKN2A, CHEK2, CTNNA1, DICER1, EPCAM, GREM1, HOXB13, KIT, MEN1, MLH1, MSH2, MSH3, MSH6, MUTYH, NBN,  . Hypothyroidism   . Morbid obesity ([place]) 01/05/2009   Qualifier: Diagnosis of  By: [name]  MD, [name]   . Pneumonia 1987  . Sleep apnea    no cpap yet, has not recieved results from home test yet  . Stroke [facility])    found on CT scan, no deficits    Medications:  Scheduled:  . heparin  4,000 Units Intravenous Once    Assessment: Patient is a 74 yof that was found to  have a renal infarct. Pharmacy has been asked to dose heparin at this time for a renal infarct.   Goal of Therapy:  Heparin level 0.3-0.7 units/ml Monitor platelets by anticoagulation protocol: Yes   Plan:  - Heparin bolus 4000 units IV x 1 dose - Heparin drip @ 1000 units/hr - Heparin level in ~ 8hours  - Monitor patient for s/s of bleeding and CBC while on heparin   Duanne Limerick PharmD. BCPS  05/05/2020,7:26 PM

## 2020-05-05 NOTE — ED Triage Notes (Signed)
Pt arrived to ED via ems from home. Pt with increase SOB for the last few day worsening today. Also states burning with urination for a month. Denies chest pain or fever

## 2020-05-05 NOTE — Telephone Encounter (Signed)
Will need a fax number to fax order. Left message on machine for Diane for fax number. Patient is currently admitted.

## 2020-05-05 NOTE — ED Provider Notes (Signed)
[facility] EMERGENCY DEPARTMENT Provider Note   CSN: [id number] Arrival date & time: 05/05/20  1338     History Chief Complaint  Patient presents with  . Weakness  . Shortness of Breath    Jasmine Hood is a 74 y.o. female.  HPI   74 year old female history of colon polyps, constipation, hypothyroidism, morbid obesity, pneumonia, CVA, metastatic breast cancer, who presents to the emergency department today for evaluation of generalized weakness.  Patient states that she has experienced generalized weakness for about 1 day and feels generally unwell.  She slid out of bed today onto the floor and laid there for 3 hours before anyone is able to get her up.  She denies any head trauma.  She is complaining of some dysuria, urinary urgency and also a recent cough.  She denies any overt shortness of breath or chest pain.  She denies any abdominal pain, vomiting, diarrhea.  She does report some weakness to the left side which she states has been present for at least the last month.  Past Medical History:  Diagnosis Date  . Breast cancer ([place]) 10/07/2016   right breast, spine  . Brown recluse spider bite    right leg  . COLONIC POLYPS, HX OF 01/05/2009   Qualifier: Diagnosis of  By: Jasmine Logan  MD, Jasmine Hood   . Complication of anesthesia    slow to awaken after wisdom  teeth extraction age 38  . Constipation   . Genetic testing 11/27/2016   Ms. Stith underwent genetic counseling and testing for hereditary cancer syndromes on 11/19/2016. Her results were negative for mutations in all 46 genes analyzed by [facility]'s 46-gene Common Hereditary Cancers Panel. Genes analyzed include: APC, ATM, AXIN2, BARD1, BMPR1A, BRCA1, BRCA2, BRIP1, CDH1, CDKN2A, CHEK2, CTNNA1, DICER1, EPCAM, GREM1, HOXB13, KIT, MEN1, MLH1, MSH2, MSH3, MSH6, MUTYH, NBN,  . Hypothyroidism   . Morbid obesity ([place]) 01/05/2009   Qualifier: Diagnosis of  By: Jasmine Logan  MD, Jasmine Hood   . Pneumonia 1987  . Sleep  apnea    no cpap yet, has not recieved results from home test yet  . Stroke [facility])    found on CT scan, no deficits    Patient Active Problem List   Diagnosis Date Noted  . Palliative care by specialist   . Advanced care planning/counseling discussion   . Goals of care, counseling/discussion   . Stroke ([place]) 03/10/2020  . COVID-19 03/10/2020  . Sepsis ([place]) 11/10/2019  . Changing skin lesion 11/07/2018  . History of right breast cancer 06/05/2018  . Acquired absence of breast and nipple, bilateral 06/05/2018  . Cellulitis 06/09/2017  . Acute lower UTI 02/20/2017  . Elevated lactic acid level   . Urinary tract infection without hematuria   . Weakness   . Generalized weakness 02/19/2017  . AKI (acute kidney injury) ([place]) 02/12/2017  . Dehydration 02/12/2017  . Abnormal TSH 02/12/2017  . Genetic testing 11/27/2016  . Port catheter in place 11/14/2016  . Bone metastases ([place]) 11/11/2016  . Morbid obesity with BMI of 50.0-59.9, adult ([place]) 11/05/2016  . Transaminitis 11/05/2016  . Malignant neoplasm of overlapping sites of right breast in female, estrogen receptor positive ([place]) 10/21/2016  . Encounter for follow-up surveillance of colon cancer   . Benign neoplasm of ascending colon   . Benign neoplasm of descending colon   . Benign neoplasm of transverse colon   . Benign neoplasm of cecum   . OSA (obstructive sleep apnea) 09/14/2012  . Contact  dermatitis and eczema due to plant 12/31/2011  . Hypothyroidism 02/01/2010  . Essential hypertension 01/05/2009  . History of colonic polyps 01/05/2009    Past Surgical History:  Procedure Laterality Date  . BILATERAL TOTAL MASTECTOMY WITH AXILLARY LYMPH NODE DISSECTION Bilateral 04/04/2017   Procedure: RIGHT MASTECTOMY WITH TARGETED RIGHT AXILLARY NODE DISSECTION AND LEFT PROPHYLATIC MASTECTOMY ERAS PATHWAY;  Surgeon: Jasmine Overall, MD;  Location: [facility];  Service: General;  Laterality: Bilateral;  PEC BLOCK  . BREAST BIOPSY Right  10/07/2016   invasive mammary carcinoma  . BREAST BIOPSY Left 10/08/2016   breast fibroadenoma no malignancy  . BREAST REDUCTION SURGERY Bilateral 10/28/2018   Procedure: Excision of bilateral excess breast tissue;  Surgeon: [name], DO;  Location: [facility];  Service: Plastics;  Laterality: Bilateral;  2 hours, please.  Marland Kitchen CATARACT EXTRACTION Bilateral   . COLONOSCOPY W/ BIOPSIES AND POLYPECTOMY  2012  . COLONOSCOPY WITH PROPOFOL N/A 12/22/2015   Procedure: COLONOSCOPY WITH PROPOFOL;  Surgeon: [name], MD;  Location: [facility];  Service: Endoscopy;  Laterality: N/A;  . INCISION AND DRAINAGE Right 1998   surgery for spider bite on RLE; "[name]"  . IR FLUORO GUIDE CV LINE RIGHT  03/17/2020  . IR REMOVAL TUN CV CATH W/O FL  04/20/2020  . IR US GUIDE VASC ACCESS RIGHT  03/17/2020  . MASTECTOMY Left 04/04/2017   [name]  . MASTECTOMY WITH AXILLARY LYMPH NODE DISSECTION Right 04/04/2017  . MOLE REMOVAL  10/29/2016   Procedure: MOLE REMOVAL LEFT CHEST;  Surgeon: Jasmine Overall, MD;  Location: [facility];  Service: General;;  . PORT-A-CATH REMOVAL Left 08/05/2018   Procedure: REMOVAL PORT-A-CATH;  Surgeon: [name], DO;  Location: [facility];  Service: Plastics;  Laterality: Left;  . PORTACATH PLACEMENT N/A 10/29/2016   Procedure: INSERTION PORT-A-CATH;  Surgeon: Jasmine Overall, MD;  Location: [facility];  Service: General;  Laterality: N/A;  . TUBAL LIGATION    . WISDOM TOOTH EXTRACTION  age 10     OB History   No obstetric history on file.     Family History  Problem Relation Age of Onset  . Breast cancer Mother 21       d.52 from metastatic disease  . Throat cancer Father        d.65 history of smoking  . Rheum arthritis Sister        3 sisters pos. for osteo and RA  . Diabetes Maternal Aunt   . Cancer Maternal Grandfather        mouth cancer-chewed tobacco  . Breast cancer Cousin 25  . Breast cancer Daughter 48   . Stomach cancer Sister 14    Social History   Tobacco Use  . Smoking status: Never Smoker  . Smokeless tobacco: Never Used  Vaping Use  . Vaping Use: Never used  Substance Use Topics  . Alcohol use: No  . Drug use: No    Home Medications Prior to Admission medications   Medication Sig Start Date End Date Taking? Authorizing Provider  anastrozole (ARIMIDEX) 1 MG tablet Take 1 tablet (1 mg total) by mouth daily. 04/25/20   [name], [name], MD  levothyroxine (SYNTHROID) 25 MCG tablet Take 1 tablet (25 mcg total) by mouth daily at 6 (six) AM. 04/25/20 05/25/20  [name], [name], MD  pantoprazole (PROTONIX) 40 MG tablet Take 1 tablet (40 mg total) by mouth daily. Patient not taking: Reported on 05/04/2020  04/25/20 05/25/20  [name], [name], MD  polyethylene glycol (MIRALAX / GLYCOLAX) 17 g packet Take 17 g by mouth 2 (two) times daily. Patient not taking: Reported on 05/04/2020 04/25/20   [name], [name], MD    Allergies    Bee venom and Latex  Review of Systems   Review of Systems  Constitutional: Negative for chills and fever.  HENT: Negative for ear pain and sore throat.   Eyes: Negative for visual disturbance.  Respiratory: Positive for cough. Negative for shortness of breath.   Cardiovascular: Negative for chest pain.  Gastrointestinal: Negative for abdominal pain, constipation, diarrhea, nausea and vomiting.  Genitourinary: Positive for dysuria and frequency. Negative for hematuria.  Musculoskeletal: Negative for back pain.  Skin: Negative for rash.  Neurological: Positive for weakness. Negative for headaches.  All other systems reviewed and are negative.   Physical Exam Updated Vital Signs BP (!) 126/52   Pulse 95   Temp 98.3 F (36.8 C) (Oral)   Resp (!) 35   SpO2 95%   Physical Exam Vitals and nursing note reviewed.  Constitutional:      General: She is not in acute distress.    Appearance: She is well-developed and  well-nourished.  HENT:     Head: Normocephalic and atraumatic.  Eyes:     Conjunctiva/sclera: Conjunctivae normal.  Cardiovascular:     Rate and Rhythm: Normal rate and regular rhythm.     Heart sounds: No murmur heard.   Pulmonary:     Effort: Pulmonary effort is normal. No respiratory distress.     Breath sounds: Examination of the right-lower field reveals rales. Examination of the left-lower field reveals rales. Rales present.  Abdominal:     Palpations: Abdomen is soft.     Tenderness: There is no abdominal tenderness.  Musculoskeletal:        General: No edema.     Cervical back: Neck supple.  Skin:    General: Skin is warm and dry.  Neurological:     Mental Status: She is alert.  Psychiatric:        Mood and Affect: Mood and affect normal.     ED Results / Procedures / Treatments   Labs (all labs ordered are listed, but only abnormal results are displayed) Labs Reviewed  LACTIC ACID, PLASMA - Abnormal; Notable for the following components:      Result Value   Lactic Acid, Venous 8.3 (*)    All other components within normal limits  LACTIC ACID, PLASMA - Abnormal; Notable for the following components:   Lactic Acid, Venous 7.8 (*)    All other components within normal limits  COMPREHENSIVE METABOLIC PANEL - Abnormal; Notable for the following components:   Sodium 131 (*)    CO2 15 (*)    Glucose, Bld 239 (*)    Creatinine, Ser 1.36 (*)    Calcium 8.0 (*)    Albumin 2.1 (*)    AST 109 (*)    GFR, Estimated 40 (*)    All other components within normal limits  CBC WITH DIFFERENTIAL/PLATELET - Abnormal; Notable for the following components:   WBC 15.8 (*)    RBC 2.96 (*)    Hemoglobin 9.3 (*)    HCT 30.5 (*)    MCV 103.0 (*)    RDW 15.9 (*)    Platelets 130 (*)    Neutro Abs 14.9 (*)    Lymphs Abs 0.5 (*)    All other components within normal  limits  BRAIN NATRIURETIC PEPTIDE - Abnormal; Notable for the following components:   B Natriuretic Peptide  1,073.6 (*)    All other components within normal limits  PROTIME-INR - Abnormal; Notable for the following components:   Prothrombin Time 16.2 (*)    INR 1.4 (*)    All other components within normal limits  LACTIC ACID, PLASMA - Abnormal; Notable for the following components:   Lactic Acid, Venous 4.0 (*)    All other components within normal limits  BASIC METABOLIC PANEL - Abnormal; Notable for the following components:   CO2 14 (*)    Glucose, Bld 178 (*)    Calcium 6.1 (*)    All other components within normal limits  CBG MONITORING, ED - Abnormal; Notable for the following components:   Glucose-Capillary 216 (*)    All other components within normal limits  TROPONIN I (HIGH SENSITIVITY) - Abnormal; Notable for the following components:   Troponin I (High Sensitivity) 381 (*)    All other components within normal limits  TROPONIN I (HIGH SENSITIVITY) - Abnormal; Notable for the following components:   Troponin I (High Sensitivity) 317 (*)    All other components within normal limits  CULTURE, BLOOD (SINGLE)  URINE CULTURE  CULTURE, BLOOD (ROUTINE X 2)  CULTURE, BLOOD (ROUTINE X 2)  CK  APTT  URINALYSIS, ROUTINE W REFLEX MICROSCOPIC  LACTIC ACID, PLASMA  BLOOD GAS, ARTERIAL  HEPARIN LEVEL (UNFRACTIONATED)  CBC  BASIC METABOLIC PANEL    EKG EKG Interpretation  Date/Time:  Friday May 05 2020 13:48:32 EST Ventricular Rate:  101 PR Interval:    QRS Duration: 92 QT Interval:  420 QTC Calculation: 545 R Axis:   -9 Text Interpretation: Sinus or ectopic atrial tachycardia Low voltage, precordial leads Confirmed by Lennice Sites [phone]) on 05/05/2020 2:16:33 PM   Radiology DG Chest 1 View  Result Date: 05/05/2020 CLINICAL DATA:  Wheezing and shortness of breath. Shortness of breath for days, progressed today. EXAM: CHEST  1 VIEW COMPARISON:  Chest radiograph 03/14/2020, CT 03/11/2020 FINDINGS: Chronic cardiomegaly. Unchanged mediastinal contours. Interstitial  thickening most consistent with pulmonary edema. There are small bilateral pleural effusions. No confluent consolidation. No pneumothorax. Stable osseous structures. IMPRESSION: Cardiomegaly with small pleural effusions and findings of pulmonary edema, consistent with CHF. Electronically Signed   By: Keith Rake M.D.   On: 05/05/2020 15:06   CT Head Wo Contrast  Result Date: 05/05/2020 CLINICAL DATA:  Fall EXAM: CT HEAD WITHOUT CONTRAST TECHNIQUE: Contiguous axial images were obtained from the base of the skull through the vertex without intravenous contrast. COMPARISON:  03/10/2020 FINDINGS: Brain: There is no acute intracranial hemorrhage, mass effect, or edema. Gray-white differentiation is preserved. There is no extra-axial fluid collection. Ventricles and sulci are within normal limits in size and configuration. Vascular: There is atherosclerotic calcification at the skull base. Skull: Calvarium is unremarkable. Sinuses/Orbits: Mild sphenoid mucosal thickening. Bilateral lens replacements. Other: None. IMPRESSION: No evidence of acute intracranial injury. Electronically Signed   By: Macy Mis M.D.   On: 05/05/2020 15:01   CT Angio Chest PE W and/or Wo Contrast  Result Date: 05/05/2020 CLINICAL DATA:  Worsening dyspnea for several days. Dysuria. Nonlocalized sepsis. Abdominal pain. History of breast cancer. EXAM: CT ANGIOGRAPHY CHEST CT ABDOMEN WITH CONTRAST TECHNIQUE: Multidetector CT imaging of the chest was performed using the standard protocol during bolus administration of intravenous contrast. Multiplanar CT image reconstructions and MIPs were obtained to evaluate the vascular anatomy. Multidetector CT imaging of the  abdomen was performed using the standard protocol during bolus administration of intravenous contrast. CONTRAST:  88m OMNIPAQUE IOHEXOL 350 MG/ML SOLN COMPARISON:  03/11/2020 chest CT angiogram.  03/06/2017 PET-CT. FINDINGS: CTA CHEST FINDINGS Cardiovascular: The study is  low quality for the evaluation of pulmonary embolism, with significant motion degradation. There are no convincing filling defects in the central, lobar, segmental or subsegmental pulmonary artery branches to suggest acute pulmonary embolism. Normal course and caliber of the thoracic aorta. Top-normal caliber main pulmonary artery (3.1 cm diameter). Normal heart size. No significant pericardial fluid/thickening. Mediastinum/Nodes: No discrete thyroid nodules. Unremarkable esophagus. No axillary adenopathy. Mildly enlarged 1.0 cm right paratracheal node (series 5/image 54), stable since 03/11/2020 chest CT. No additional pathologically enlarged mediastinal nodes. No hilar adenopathy. Lungs/Pleura: No pneumothorax. No pleural effusion. No acute consolidative airspace disease, lung masses or significant pulmonary nodules. Patchy peribronchovascular and peripheral reticulation and ground-glass opacity throughout both lungs, not appreciably changed. Musculoskeletal: No aggressive appearing focal osseous lesions. Moderate thoracic spondylosis. Stable postsurgical changes from bilateral mastectomy. Review of the MIP images confirms the above findings. CT ABDOMEN FINDINGS Hepatobiliary: Diffusely irregular liver surface with relative hypertrophy of the lateral segment left liver lobe, compatible with cirrhosis. A few scattered indistinct hypodense liver lesions throughout the liver, largest 0.7 cm at the right liver dome (series 3/image 13). No larger liver lesions. Normal gallbladder with no radiopaque cholelithiasis. No biliary ductal dilatation. Pancreas: Normal, with no mass or duct dilation. Spleen: Mild splenomegaly. Craniocaudal splenic length 13.1 cm. No splenic mass. Adrenals/Urinary Tract: Normal adrenals. No hydronephrosis. Patchy segmental perfusion defect throughout the anterior mid to lower right kidney (series 3/image 45) without significant perinephric fat stranding or fluid. Subcentimeter hypodense renal  cortical lesion in the anterior lower left kidney is too small to characterize and requires no follow-up. Mild diffuse bladder wall thickening and mild perivesical fat haziness. Nondistended bladder. Stomach/Bowel: Normal non-distended stomach. Normal small bowel caliber with no small bowel wall thickening. Appendix within normal limits. No large bowel wall thickening or pericolonic fat stranding. No significant colonic diverticulosis. Vascular/Lymphatic: Mildly atherosclerotic nonaneurysmal abdominal aorta. Patent portal, splenic, hepatic and renal veins. Small paraumbilical varix. No pathologically enlarged lymph nodes in the abdomen. Other: No pneumoperitoneum, ascites or focal fluid collection. Normal size anteverted uterus. Small amount of nonspecific gas in the uterine cavity. No adnexal masses. Musculoskeletal: No aggressive appearing focal osseous lesions. Moderate lumbar spondylosis. Review of the MIP images confirms the above findings. IMPRESSION: 1. Limited motion degraded scan. No evidence of acute pulmonary embolism. 2. 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 3. Mild diffuse bladder wall thickening and mild perivesical fat haziness, suggesting acute cystitis. 4. Cirrhosis. Scattered indistinct subcentimeter hypodense liver lesions, indeterminate. Recommend attention on follow-up MRI abdomen without and with IV contrast in 3-6 months. This recommendation follows ACR consensus guidelines: Managing Incidental Findings on Abdominal CT: White Paper of the ACR Incidental Findings Committee. J Am Coll Radiol 2010;7:754-773 5. Mild splenomegaly. No ascites. Small paraumbilical varix. 6. Nonspecific small amount of gas in the uterine cavity. 7. Patchy peribronchovascular and peripheral reticulation and ground-glass opacity throughout both lungs, not appreciably  changed, potentially postinflammatory fibrosis related to the history of COVID-19 infection. 8. Aortic Atherosclerosis (ICD10-I70.0). Electronically Signed   By: JIlona SorrelM.D.   On: 05/05/2020 17:34   CT ABDOMEN W CONTRAST  Result Date: 05/05/2020 CLINICAL DATA:  Worsening dyspnea for several days.  Dysuria. Nonlocalized sepsis. Abdominal pain. History of breast cancer. EXAM: CT ANGIOGRAPHY CHEST CT ABDOMEN WITH CONTRAST TECHNIQUE: Multidetector CT imaging of the chest was performed using the standard protocol during bolus administration of intravenous contrast. Multiplanar CT image reconstructions and MIPs were obtained to evaluate the vascular anatomy. Multidetector CT imaging of the abdomen was performed using the standard protocol during bolus administration of intravenous contrast. CONTRAST:  34m OMNIPAQUE IOHEXOL 350 MG/ML SOLN COMPARISON:  03/11/2020 chest CT angiogram.  03/06/2017 PET-CT. FINDINGS: CTA CHEST FINDINGS Cardiovascular: The study is low quality for the evaluation of pulmonary embolism, with significant motion degradation. There are no convincing filling defects in the central, lobar, segmental or subsegmental pulmonary artery branches to suggest acute pulmonary embolism. Normal course and caliber of the thoracic aorta. Top-normal caliber main pulmonary artery (3.1 cm diameter). Normal heart size. No significant pericardial fluid/thickening. Mediastinum/Nodes: No discrete thyroid nodules. Unremarkable esophagus. No axillary adenopathy. Mildly enlarged 1.0 cm right paratracheal node (series 5/image 54), stable since 03/11/2020 chest CT. No additional pathologically enlarged mediastinal nodes. No hilar adenopathy. Lungs/Pleura: No pneumothorax. No pleural effusion. No acute consolidative airspace disease, lung masses or significant pulmonary nodules. Patchy peribronchovascular and peripheral reticulation and ground-glass opacity throughout both lungs, not appreciably changed.  Musculoskeletal: No aggressive appearing focal osseous lesions. Moderate thoracic spondylosis. Stable postsurgical changes from bilateral mastectomy. Review of the MIP images confirms the above findings. CT ABDOMEN FINDINGS Hepatobiliary: Diffusely irregular liver surface with relative hypertrophy of the lateral segment left liver lobe, compatible with cirrhosis. A few scattered indistinct hypodense liver lesions throughout the liver, largest 0.7 cm at the right liver dome (series 3/image 13). No larger liver lesions. Normal gallbladder with no radiopaque cholelithiasis. No biliary ductal dilatation. Pancreas: Normal, with no mass or duct dilation. Spleen: Mild splenomegaly. Craniocaudal splenic length 13.1 cm. No splenic mass. Adrenals/Urinary Tract: Normal adrenals. No hydronephrosis. Patchy segmental perfusion defect throughout the anterior mid to lower right kidney (series 3/image 45) without significant perinephric fat stranding or fluid. Subcentimeter hypodense renal cortical lesion in the anterior lower left kidney is too small to characterize and requires no follow-up. Mild diffuse bladder wall thickening and mild perivesical fat haziness. Nondistended bladder. Stomach/Bowel: Normal non-distended stomach. Normal small bowel caliber with no small bowel wall thickening. Appendix within normal limits. No large bowel wall thickening or pericolonic fat stranding. No significant colonic diverticulosis. Vascular/Lymphatic: Mildly atherosclerotic nonaneurysmal abdominal aorta. Patent portal, splenic, hepatic and renal veins. Small paraumbilical varix. No pathologically enlarged lymph nodes in the abdomen. Other: No pneumoperitoneum, ascites or focal fluid collection. Normal size anteverted uterus. Small amount of nonspecific gas in the uterine cavity. No adnexal masses. Musculoskeletal: No aggressive appearing focal osseous lesions. Moderate lumbar spondylosis. Review of the MIP images confirms the above findings.  IMPRESSION: 1. Limited motion degraded scan. No evidence of acute pulmonary embolism. 2. 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 3. Mild diffuse bladder wall thickening and mild perivesical fat haziness, suggesting acute cystitis. 4. Cirrhosis. Scattered indistinct subcentimeter hypodense liver lesions, indeterminate. Recommend attention on follow-up MRI abdomen without and with IV contrast in 3-6 months. This recommendation follows ACR consensus guidelines: Managing Incidental Findings on Abdominal CT: White Paper of the ACR Incidental Findings Committee. J Am Coll Radiol 2010;7:754-773 5. Mild splenomegaly. No ascites. Small paraumbilical varix. 6. Nonspecific small amount of gas in the uterine cavity. 7. Patchy peribronchovascular and peripheral reticulation and ground-glass  opacity throughout both lungs, not appreciably changed, potentially postinflammatory fibrosis related to the history of COVID-19 infection. 8. Aortic Atherosclerosis (ICD10-I70.0). Electronically Signed   By: Ilona Sorrel M.D.   On: 05/05/2020 17:34    Procedures Procedures (including critical care time)  CRITICAL CARE Performed by: Rodney Booze   Total critical care time: 61 minutes  Critical care time was exclusive of separately billable procedures and treating other patients.  Critical care was necessary to treat or prevent imminent or life-threatening deterioration.  Critical care was time spent personally by me on the following activities: development of treatment plan with patient and/or surrogate as well as nursing, discussions with consultants, evaluation of patient's response to treatment, examination of patient, obtaining history from patient or surrogate, ordering and performing treatments and interventions, ordering and review of laboratory studies, ordering and  review of radiographic studies, pulse oximetry and re-evaluation of patient's condition.   Medications Ordered in ED Medications  lactated ringers infusion (has no administration in time range)  vancomycin (VANCOREADY) IVPB 1250 mg/250 mL (has no administration in time range)  ceFEPIme (MAXIPIME) 2 g in sodium chloride 0.9 % 100 mL IVPB (has no administration in time range)  heparin bolus via infusion 4,000 Units (has no administration in time range)  heparin ADULT infusion 100 units/mL (25000 units/246m sodium chloride 0.45%) (has no administration in time range)  docusate sodium (COLACE) capsule 100 mg (has no administration in time range)  polyethylene glycol (MIRALAX / GLYCOLAX) packet 17 g (has no administration in time range)  sodium chloride 0.9 % bolus 1,000 mL (0 mLs Intravenous Stopped 05/05/20 1633)  ceFEPIme (MAXIPIME) 2 g in sodium chloride 0.9 % 100 mL IVPB (0 g Intravenous Stopped 05/05/20 1630)  metroNIDAZOLE (FLAGYL) IVPB 500 mg (0 mg Intravenous Stopped 05/05/20 2008)  acetaminophen (TYLENOL) tablet 650 mg (650 mg Oral Given 05/05/20 1528)  sodium chloride 0.9 % bolus 1,000 mL (0 mLs Intravenous Stopped 05/05/20 1802)    And  sodium chloride 0.9 % bolus 1,000 mL (1,000 mLs Intravenous New Bag/Given 05/05/20 1802)  vancomycin (VANCOREADY) IVPB 2000 mg/400 mL (0 mg Intravenous Stopped 05/05/20 1851)  iohexol (OMNIPAQUE) 350 MG/ML injection 80 mL (80 mLs Intravenous Contrast Given 05/05/20 1703)    ED Course  I have reviewed the triage vital signs and the nursing notes.  Pertinent labs & imaging results that were available during my care of the patient were reviewed by me and considered in my medical decision making (see chart for details).    MDM Rules/Calculators/A&P                          74year old female presenting for evaluation of generalized weakness that started today.  Had a fall out of bed.  Is complaining of some urinary symptoms as well as a cough for  the last few days.  She denies any shortness of breath but looks tachypneic on exam.  She is marginally tachycardic as well but not hypotensive.  She has a rectal temp of 100.2.  Code sepsis was initiated, IV fluids and broad-spectrum antibiotics were ordered.  There was some delay in administration of antibiotics as patient did not have access initially and not of the labs were drawn initially.  Ultrasound-guided IV was completed by Dr. CRonnald Nianremainder of labs obtained and sent.  Reviewed/interpreted labs CBC shows leukocytosis of 15,000, hemoglobin 9, down from 1 month ago CMP with mild hyponatremia, bicarb low at 15, elevated creatinine at  1.36 elevated AST which appears.  Normal anion gap CK normal Troponin elevated at 381 -patient denies chest pain, suspect this could be due to demand Coags mildly elevated Blood cultures obtained UA pending on admission  EKG Sinus or ectopic atrial tachycardia Low voltage, precordial leads  Reviewed/interpreted imaging CXR - Cardiomegaly with small pleural effusions and findings of pulmonary edema, consistent with CHF. CT head - No evidence of acute intracranial injury.  CT chest/abd/pelvis - 1. Limited motion degraded scan. No evidence of acute pulmonary embolism. 2. 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 3. Mild diffuse bladder wall thickening and mild perivesical fat haziness, suggesting acute cystitis. 4. Cirrhosis. Scattered indistinct subcentimeter hypodense liver lesions, indeterminate. Recommend attention on follow-up MRI abdomen without and with IV contrast in 3-6 months. This recommendation follows ACR consensus guidelines: Managing Incidental Findings on Abdominal CT: White Paper of the ACR Incidental Findings Committee. J Am Coll Radiol 2010;7:754-773 5. Mild splenomegaly. No ascites. Small  paraumbilical varix. 6. Nonspecific small amount of gas in the uterine cavity. 7. Patchy peribronchovascular and peripheral reticulation and ground-glass opacity throughout both lungs, not appreciably changed, potentially postinflammatory fibrosis related to the history of COVID-19 infection. 8. Aortic Atherosclerosis  Pt with renal infarct. Likely with uti as well. Significant lactic acidosis. ivf and abx given. Some improvement in lactic acid, but repeat after additionally fluids pending.  7:13 PM CONSULT with Dr. Hal Hope who recommends urology and critical care consult.    7:35 PM CONSULT with Dr. Carlis Abbott with critical care who will see patient and admit  8:04 PM CONSULT with Dr. Diona Fanti with urology who states that from a urology standpoint, there is not a specific tx procedure and he recommends tx the source of the emboli.   Final Clinical Impression(s) / ED Diagnoses Final diagnoses:  Sepsis [facility])    Rx / [place] Orders ED Discharge Orders    None       Rodney Booze, PA-C 05/05/20 2014    Lennice Sites, DO 05/06/20 1537

## 2020-05-05 NOTE — H&P (Signed)
NAME:  Jasmine Hood, MRN:  [id number], DOB:  1946/01/09, LOS: 0 ADMISSION DATE:  05/05/2020, CONSULTATION DATE:  12/17 REFERRING MD:  Ronnald Nian- ED, CHIEF COMPLAINT:  Lactic acidosis  Brief History:  Sepsis due to UTI, stage 4 breast cancer  History of Present Illness:  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  Most history obtained from the patient's daughter Jasmine Hood over the phone.  She usually requires a significant amount assistance to get in and out of bed, and her daughter brought her walker to bedside per normal.  When she went to stand up she was unable to support herself on her legs and had a mild fall to the floor.  Her daughter was able to limit the severity of her fall but not fully support her.  She noted that her baseline tremor was slightly worse than normal and she was slurring her speech some.  She has a history of frequent urinary tract infections.  She presented to the ED with a fever of 102, leukocytosis, tachycardia.  She was given 3 L normal saline, vancomycin, Flagyl, cefepime after 1 set of blood cultures drawn.  Second set unable to be obtained due to difficult IV access.  She has an [place] MOST form signed at bedside.  She has previously told the ER physician she would not want a central line, would not want vasopressors, and she confirmed she wants to be DNR.  Past Medical History:  Metastatic breast cancer- first diagnosed in 2016 hypothyroidism  Significant Hospital Events:    Consults:    Procedures:    Significant Diagnostic Tests:  CTA C/A/P- right renal infarction, cystitis  Micro Data:  Blood cultures 12/17> Urine culture 12/17>  Antimicrobials:  vanc 12/17 Metronidazole 12/17 Cefepime 12/17>  Interim History / Subjective:    Objective   Blood pressure (!) 131/54, pulse 96, temperature 98.3 F (36.8 C), temperature source Oral, resp. rate (!) 42, SpO2 93  %.       No intake or output data in the 24 hours ending 05/05/20 1958 There were no vitals filed for this visit.  Examination: General: Frail appearing elderly woman lying in bed no acute distress HENT: [place]/AT, eyes anicteric.  Mild jaw tremor. Lungs: Rales on the right anteriorly, clear on the left, tachypnea, no accessory muscle use. Cardiovascular: Tachycardic, regular rhythm Abdomen: Soft, nontender, obese Extremities: No peripheral edema, no clubbing or cyanosis Neuro: Globally weak, awake and alert, mildly slurred speech but answering questions appropriately Derm: Thin skin, few ecchymoses  Resolved Hospital Problem list     Assessment & Plan:  Sepsis due to UTI, presumably cystitis based on CT -1 set of blood cultures from the ED -Volume resuscitated -Continue cefepime -UA not yet obtained; urine culture needs to be collected  Lactic acidosis, presumably due to sepsis.  Potentially related to renal infarction. -Continue to trend -Volume resuscitated  AKI, likely due to sepsis -Volume resuscitated -Continue to monitor -Renally dose meds and avoid nephrotoxic meds -Strict I's/O  Recent bacteremia, concern for embolic renal infarction.  She refused transesophageal echocardiogram during last admission. -Repeat transthoracic echocardiogram -Blood culture x 1 obtained -Agree with empiric heparin infusion for renal infarction  Elevated troponin and BNP, no ischemic changes on EKG.  Presumed to be due to sepsis. -Monitor on telemetry -Agree with heparin infusion  Hyponatremia -Continue to monitor  Anemia, thrombocytopenia-both chronic -Continue to monitor  Hyperglycemia -Sliding scale insulin -Goal BG 140-180  Hypothyroidism -Continue PTA Synthroid  Stage 4 metastatic breast cancer -Continue PTA Arimidex -[place] MOST form at bedside, very appropriate for her functional status   Daughter Jasmine Hood updated over the phone.  Best practice (evaluated daily)   Diet: Regular Pain/Anxiety/Delirium protocol (if indicated): n/a VAP protocol (if indicated): n/a DVT prophylaxis: Heparin drip GI prophylaxis: Pantoprazole Glucose control: Sliding scale Mobility: Progressive Disposition: ICU  Goals of Care:  Last date of multidisciplinary goals of care discussion: 12/17  Family and staff present: daughter Jasmine Hood over the phone Summary of discussion: probably needs rehab at discharge before comes home, [place] MOST form verified Follow up goals of care discussion due:  Code Status: DNR- has MOST form signed  Labs   CBC: Recent Labs  Lab 05/05/20 1436  WBC 15.8*  NEUTROABS 14.9*  HGB 9.3*  HCT 30.5*  MCV 103.0*  PLT 130*    Basic Metabolic Panel: Recent Labs  Lab 05/05/20 1436  NA 131*  K 4.9  CL 101  CO2 15*  GLUCOSE 239*  BUN 14  CREATININE 1.36*  CALCIUM 8.0*   GFR: Estimated Creatinine Clearance: 35.8 mL/min (A) (by C-G formula based on SCr of 1.36 mg/dL (H)). Recent Labs  Lab 05/05/20 1407 05/05/20 1436 05/05/20 1543  WBC  --  15.8*  --   LATICACIDVEN 8.3*  --  7.8*    Liver Function Tests: Recent Labs  Lab 05/05/20 1436  AST 109*  ALT 37  ALKPHOS 110  BILITOT 1.2  PROT 6.8  ALBUMIN 2.1*   No results for input(s): LIPASE, AMYLASE in the last 168 hours. No results for input(s): AMMONIA in the last 168 hours.  ABG No results found for: PHART, PCO2ART, PO2ART, HCO3, TCO2, ACIDBASEDEF, O2SAT   Coagulation Profile: Recent Labs  Lab 05/05/20 1525  INR 1.4*    Cardiac Enzymes: Recent Labs  Lab 05/05/20 1436  CKTOTAL 45    HbA1C: Hgb A1c MFr Bld  Date/Time Value Ref Range Status  03/11/2020 04:08 AM 6.5 (H) 4.8 - 5.6 % Final    Comment:    (NOTE)         Prediabetes: 5.7 - 6.4         Diabetes: >6.4         Glycemic control for adults with diabetes: <7.0     CBG: Recent Labs  Lab 05/05/20 1402  GLUCAP 216*    Review of Systems:   Review of Systems  Constitutional: Positive for fever  and malaise/fatigue.  Respiratory: Negative for shortness of breath.   Cardiovascular: Negative for chest pain.  Gastrointestinal: Negative for abdominal pain and nausea.  Musculoskeletal:       Chronic back pain- midline, unchanged  Neurological: Positive for weakness.     Past Medical History:  She,  has a past medical history of Breast cancer ([place]) (10/07/2016), Brown recluse spider bite, COLONIC POLYPS, HX OF (0/34/7425), Complication of anesthesia, Constipation, Genetic testing (11/27/2016), Hypothyroidism, Morbid obesity ([place]) (01/05/2009), Pneumonia (1987), Sleep apnea, and Stroke ([place]).   Surgical History:   Past Surgical History:  Procedure Laterality Date  . BILATERAL TOTAL MASTECTOMY WITH AXILLARY LYMPH NODE DISSECTION Bilateral 04/04/2017   Procedure: RIGHT MASTECTOMY WITH TARGETED RIGHT AXILLARY NODE DISSECTION AND LEFT PROPHYLATIC MASTECTOMY ERAS PATHWAY;  Surgeon: Alphonsa Overall, MD;  Location: [place];  Service: General;  Laterality: Bilateral;  PEC BLOCK  . BREAST BIOPSY Right 10/07/2016   invasive mammary carcinoma  . BREAST BIOPSY Left 10/08/2016  breast fibroadenoma no malignancy  . BREAST REDUCTION SURGERY Bilateral 10/28/2018   Procedure: Excision of bilateral excess breast tissue;  Surgeon: [name], DO;  Location: [place];  Service: Plastics;  Laterality: Bilateral;  2 hours, please.  Marland Kitchen CATARACT EXTRACTION Bilateral   . COLONOSCOPY W/ BIOPSIES AND POLYPECTOMY  2012  . COLONOSCOPY WITH PROPOFOL N/A 12/22/2015   Procedure: COLONOSCOPY WITH PROPOFOL;  Surgeon: [name], MD;  Location: [facility];  Service: Endoscopy;  Laterality: N/A;  . INCISION AND DRAINAGE Right 1998   surgery for spider bite on RLE; "[name]"  . IR FLUORO GUIDE CV LINE RIGHT  03/17/2020  . IR REMOVAL TUN CV CATH W/O FL  04/20/2020  . IR US GUIDE VASC ACCESS RIGHT  03/17/2020  . MASTECTOMY Left 04/04/2017   [name]  . MASTECTOMY WITH AXILLARY  LYMPH NODE DISSECTION Right 04/04/2017  . MOLE REMOVAL  10/29/2016   Procedure: MOLE REMOVAL LEFT CHEST;  Surgeon: Alphonsa Overall, MD;  Location: [facility];  Service: General;;  . PORT-A-CATH REMOVAL Left 08/05/2018   Procedure: REMOVAL PORT-A-CATH;  Surgeon: [name], DO;  Location: [place];  Service: Plastics;  Laterality: Left;  . PORTACATH PLACEMENT N/A 10/29/2016   Procedure: INSERTION PORT-A-CATH;  Surgeon: Alphonsa Overall, MD;  Location: [facility];  Service: General;  Laterality: N/A;  . TUBAL LIGATION    . WISDOM TOOTH EXTRACTION  age 57     Social History:   reports that she has never smoked. She has never used smokeless tobacco. She reports that she does not drink alcohol and does not use drugs.   Family History:  Her family history includes Breast cancer (age of onset: 70) in her cousin; Breast cancer (age of onset: 35) in her daughter; Breast cancer (age of onset: 25) in her mother; Cancer in her maternal grandfather; Diabetes in her maternal aunt; Rheum arthritis in her sister; Stomach cancer (age of onset: 8) in her sister; Throat cancer in her father.   Allergies Allergies  Allergen Reactions  . Bee Venom Anaphylaxis  . Latex Swelling     Home Medications  Prior to Admission medications   Medication Sig Start Date End Date Taking? Authorizing Provider  anastrozole (ARIMIDEX) 1 MG tablet Take 1 tablet (1 mg total) by mouth daily. 04/25/20   [name], [name], MD  levothyroxine (SYNTHROID) 25 MCG tablet Take 1 tablet (25 mcg total) by mouth daily at 6 (six) AM. 04/25/20 05/25/20  [name], [name], MD  pantoprazole (PROTONIX) 40 MG tablet Take 1 tablet (40 mg total) by mouth daily. Patient not taking: Reported on 05/04/2020 04/25/20 05/25/20  [name], [name], MD  polyethylene glycol (MIRALAX / GLYCOLAX) 17 g packet Take 17 g by mouth 2 (two) times daily. Patient not taking: Reported on 05/04/2020 04/25/20   [name], [name], MD     Critical care time: 45 min.     Julian Hy, DO 05/05/20 8:39 PM [facility]

## 2020-05-05 NOTE — ED Notes (Signed)
Pt is more alert and this time and skin color is improving

## 2020-05-05 NOTE — Progress Notes (Addendum)
[facility] Progress Note Patient Name: Jasmine Hood DOB: 11-07-1945 MRN: [id number]   Date of Service  05/05/2020  HPI/Events of Note  Per bedside RN patient has had mild shortness of breath and some wheezing on auscultation, CT PE earlier today was negative for PE or pulmonary edema. Patient was admitted earlier this evening with sepsis of urinary tract origin, she has a history of metastatic breast cancer.  eICU Interventions  PRN albuterol neb ordered, along with incentive spirometer. New Patient Evaluation completed.        Kerry Kass [name] 05/05/2020, 10:40 PM

## 2020-05-05 NOTE — Progress Notes (Signed)
Pharmacy Antibiotic Note  Jasmine Hood is a 74 y.o. female admitted on 05/05/2020 with sepsis.  Pharmacy has been consulted for Cefepime and Vancomycin dosing.      Temp (24hrs), Avg:100.2 F (37.9 C), Min:98.4 F (36.9 C), Max:102 F (38.9 C)  Recent Labs  Lab 05/05/20 1407 05/05/20 1436  WBC  --  PENDING  CREATININE  --  1.36*  LATICACIDVEN 8.3*  --     Estimated Creatinine Clearance: 35.8 mL/min (A) (by C-G formula based on SCr of 1.36 mg/dL (H)).    Allergies  Allergen Reactions  . Bee Venom Anaphylaxis  . Latex Swelling    Antimicrobials this admission: 12/17 Cefepime >>  12/17 Vancomycin >>   Dose adjustments this admission: N/a  Microbiology results: Pending   Plan:  - Cefepime 2g IV q12h - Vancomycin 2000mg  IV x 1 dose  - Followed by Vancomycin 1250mg  IV q24h - Goal trough ~ 15 - Monitor patients renal function and urine output  - De-escalate ABX when appropriate   Thank you for allowing pharmacy to be a part of this patient's care.  Duanne Limerick PharmD. BCPS 05/05/2020 3:42 PM

## 2020-05-06 ENCOUNTER — Inpatient Hospital Stay (HOSPITAL_COMMUNITY): Payer: Medicare Other

## 2020-05-06 DIAGNOSIS — R6521 Severe sepsis with septic shock: Secondary | ICD-10-CM

## 2020-05-06 DIAGNOSIS — R0602 Shortness of breath: Secondary | ICD-10-CM

## 2020-05-06 DIAGNOSIS — R7881 Bacteremia: Secondary | ICD-10-CM

## 2020-05-06 DIAGNOSIS — I351 Nonrheumatic aortic (valve) insufficiency: Secondary | ICD-10-CM

## 2020-05-06 DIAGNOSIS — I340 Nonrheumatic mitral (valve) insufficiency: Secondary | ICD-10-CM

## 2020-05-06 DIAGNOSIS — I361 Nonrheumatic tricuspid (valve) insufficiency: Secondary | ICD-10-CM

## 2020-05-06 LAB — CBC
HCT: 27.8 % — ABNORMAL LOW (ref 36.0–46.0)
Hemoglobin: 8.8 g/dL — ABNORMAL LOW (ref 12.0–15.0)
MCH: 32.4 pg (ref 26.0–34.0)
MCHC: 31.7 g/dL (ref 30.0–36.0)
MCV: 102.2 fL — ABNORMAL HIGH (ref 80.0–100.0)
Platelets: 98 10*3/uL — ABNORMAL LOW (ref 150–400)
RBC: 2.72 MIL/uL — ABNORMAL LOW (ref 3.87–5.11)
RDW: 16 % — ABNORMAL HIGH (ref 11.5–15.5)
WBC: 12.2 10*3/uL — ABNORMAL HIGH (ref 4.0–10.5)
nRBC: 0.2 % (ref 0.0–0.2)

## 2020-05-06 LAB — GLUCOSE, CAPILLARY
Glucose-Capillary: 177 mg/dL — ABNORMAL HIGH (ref 70–99)
Glucose-Capillary: 129 mg/dL — ABNORMAL HIGH (ref 70–99)
Glucose-Capillary: 182 mg/dL — ABNORMAL HIGH (ref 70–99)
Glucose-Capillary: 266 mg/dL — ABNORMAL HIGH (ref 70–99)
Glucose-Capillary: 160 mg/dL — ABNORMAL HIGH (ref 70–99)
Glucose-Capillary: 130 mg/dL — ABNORMAL HIGH (ref 70–99)

## 2020-05-06 LAB — ECHOCARDIOGRAM COMPLETE
Area-P 1/2: 4.96 cm2
Height: 59 in
S' Lateral: 2.4 cm
Weight: 3467.39 oz

## 2020-05-06 LAB — BLOOD CULTURE ID PANEL (REFLEXED) - BCID2
A.calcoaceticus-baumannii: NOT DETECTED
Bacteroides fragilis: NOT DETECTED
CTX-M ESBL: NOT DETECTED
Candida albicans: NOT DETECTED
Candida auris: NOT DETECTED
Candida glabrata: NOT DETECTED
Candida krusei: NOT DETECTED
Candida parapsilosis: NOT DETECTED
Candida tropicalis: NOT DETECTED
Carbapenem resist OXA 48 LIKE: NOT DETECTED
Carbapenem resistance IMP: NOT DETECTED
Carbapenem resistance KPC: NOT DETECTED
Carbapenem resistance NDM: NOT DETECTED
Carbapenem resistance VIM: NOT DETECTED
Cryptococcus neoformans/gattii: NOT DETECTED
Enterobacter cloacae complex: NOT DETECTED
Enterobacterales: DETECTED — AB
Enterococcus Faecium: NOT DETECTED
Enterococcus faecalis: NOT DETECTED
Escherichia coli: NOT DETECTED
Haemophilus influenzae: NOT DETECTED
Klebsiella aerogenes: NOT DETECTED
Klebsiella oxytoca: NOT DETECTED
Klebsiella pneumoniae: DETECTED — AB
Listeria monocytogenes: NOT DETECTED
Methicillin resistance mecA/C: NOT DETECTED
Neisseria meningitidis: NOT DETECTED
Proteus species: NOT DETECTED
Pseudomonas aeruginosa: NOT DETECTED
Salmonella species: NOT DETECTED
Serratia marcescens: NOT DETECTED
Staphylococcus aureus (BCID): NOT DETECTED
Staphylococcus epidermidis: DETECTED — AB
Staphylococcus lugdunensis: NOT DETECTED
Staphylococcus species: DETECTED — AB
Stenotrophomonas maltophilia: NOT DETECTED
Streptococcus agalactiae: NOT DETECTED
Streptococcus pneumoniae: NOT DETECTED
Streptococcus pyogenes: NOT DETECTED
Streptococcus species: NOT DETECTED

## 2020-05-06 LAB — HEMOGLOBIN A1C
Hgb A1c MFr Bld: 5.7 % — ABNORMAL HIGH (ref 4.8–5.6)
Mean Plasma Glucose: 116.89 mg/dL

## 2020-05-06 LAB — MRSA PCR SCREENING: MRSA by PCR: NEGATIVE

## 2020-05-06 LAB — PHOSPHORUS: Phosphorus: 2.2 mg/dL — ABNORMAL LOW (ref 2.5–4.6)

## 2020-05-06 LAB — MAGNESIUM: Magnesium: 1.2 mg/dL — ABNORMAL LOW (ref 1.7–2.4)

## 2020-05-06 LAB — LACTIC ACID, PLASMA
Lactic Acid, Venous: 3.2 mmol/L (ref 0.5–1.9)
Lactic Acid, Venous: 4.9 mmol/L (ref 0.5–1.9)
Lactic Acid, Venous: 5.3 mmol/L (ref 0.5–1.9)
Lactic Acid, Venous: 2.6 mmol/L (ref 0.5–1.9)

## 2020-05-06 LAB — BASIC METABOLIC PANEL
Anion gap: 13 (ref 5–15)
BUN: 15 mg/dL (ref 8–23)
CO2: 16 mmol/L — ABNORMAL LOW (ref 22–32)
Calcium: 7.8 mg/dL — ABNORMAL LOW (ref 8.9–10.3)
Chloride: 105 mmol/L (ref 98–111)
Creatinine, Ser: 0.88 mg/dL (ref 0.44–1.00)
GFR, Estimated: >60 mL/min (ref >60)
Glucose, Bld: 162 mg/dL — ABNORMAL HIGH (ref 70–99)
Potassium: 4 mmol/L (ref 3.5–5.1)
Sodium: 134 mmol/L — ABNORMAL LOW (ref 135–145)

## 2020-05-06 LAB — HEPARIN LEVEL (UNFRACTIONATED)
Heparin Unfractionated: 0.22 IU/mL — ABNORMAL LOW (ref 0.30–0.70)
Heparin Unfractionated: 0.28 IU/mL — ABNORMAL LOW (ref 0.30–0.70)
Heparin Unfractionated: 0.15 IU/mL — ABNORMAL LOW (ref 0.30–0.70)

## 2020-05-06 LAB — TROPONIN I (HIGH SENSITIVITY): Troponin I (High Sensitivity): 365 ng/L (ref <18)

## 2020-05-06 MED ORDER — FUROSEMIDE 10 MG/ML IJ SOLN
INTRAMUSCULAR | Status: AC
  Administered 2020-05-06: 20 mg via INTRAVENOUS
  Filled 2020-05-06: qty 2

## 2020-05-06 MED ORDER — MAGNESIUM SULFATE 2 GM/50ML IV SOLN
2.0000 g | Freq: Once | INTRAVENOUS | Status: AC
  Administered 2020-05-06: 2 g via INTRAVENOUS
  Filled 2020-05-06: qty 50

## 2020-05-06 MED ORDER — MORPHINE SULFATE (PF) 2 MG/ML IV SOLN
1.0000 mg | Freq: Once | INTRAVENOUS | Status: AC
  Administered 2020-05-06: 1 mg via INTRAVENOUS
  Filled 2020-05-06: qty 1

## 2020-05-06 MED ORDER — FUROSEMIDE 10 MG/ML IJ SOLN: 20.0000 mg | Freq: Once | INTRAMUSCULAR | Status: AC

## 2020-05-06 MED ORDER — POTASSIUM PHOSPHATES 15 MMOLE/5ML IV SOLN
30.0000 mmol | Freq: Once | INTRAVENOUS | Status: AC
  Administered 2020-05-06: 30 mmol via INTRAVENOUS
  Filled 2020-05-06 (×2): qty 10

## 2020-05-06 MED ORDER — LACTATED RINGERS IV BOLUS
500.0000 mL | Freq: Once | INTRAVENOUS | Status: AC
  Administered 2020-05-06: 500 mL via INTRAVENOUS

## 2020-05-06 MED ORDER — ALBUMIN HUMAN 5 % IV SOLN
25.0000 g | Freq: Once | INTRAVENOUS | Status: AC
  Administered 2020-05-06: 25 g via INTRAVENOUS
  Filled 2020-05-06: qty 500

## 2020-05-06 MED ORDER — SODIUM CHLORIDE 0.9 % IV SOLN
2.0000 g | INTRAVENOUS | Status: DC
  Administered 2020-05-06 – 2020-05-08 (×3): 2 g via INTRAVENOUS
  Filled 2020-05-06 (×3): qty 20

## 2020-05-06 NOTE — Progress Notes (Signed)
[facility] for Heparin Indication: Renal infarct   Allergies  Allergen Reactions  . Bee Venom Anaphylaxis  . Latex Swelling    Patient Measurements: Weight: 98.4 kg (216 lb 14.9 oz) Heparin Dosing Weight: 66.7 kg  Vital Signs: Temp: 98.7 F (37.1 C) (12/18 0400) Temp Source: Oral (12/18 0400) BP: 127/43 (12/18 0400) Pulse Rate: 107 (12/18 0400)  Labs: Recent Labs    05/05/20 1436 05/05/20 1525 05/05/20 1540 05/05/20 1920 05/05/20 2338 05/06/20 0310  HGB 9.3*  --   --   --  7.8* 8.8*  HCT 30.5*  --   --   --  23.0* 27.8*  PLT 130*  --   --   --   --  98*  APTT  --  35  --   --   --   --   LABPROT  --  16.2*  --   --   --   --   INR  --  1.4*  --   --   --   --   HEPARINUNFRC  --   --   --   --   --  0.22*  CREATININE 1.36*  --   --  0.84  --  0.88  CKTOTAL 45  --   --   --   --   --   TROPONINIHS 381*  --  317*  --   --   --     Estimated Creatinine Clearance: 56.1 mL/min (by C-G formula based on SCr of 0.88 mg/dL).  Assessment: 74 y.o. female with possible renal infarction for heparin.   Goal of Therapy:  Heparin level 0.3-0.7 units/ml Monitor platelets by anticoagulation protocol: Yes   Plan:  Increase Heparin 1200 units/hr Check heparin level in 8 hours.   Silva Aamodt, Bronson Curb PharmD. BCPS  05/06/2020,4:09 AM

## 2020-05-06 NOTE — Progress Notes (Signed)
eLink Physician-Brief Progress Note Patient Name: Jasmine Hood DOB: [birth year] MRN: [id number]   Date of Service  05/06/2020  HPI/Events of Note  Patient had a spell of shortness of breath approximately two thirds of the way into the first 250 ml of Albumin so bedside RN reduced the rate to 125 ml  / hour.  eICU Interventions  Will complete first bottle of Albumin at 125 ml / hour and discontinue order for second bottle.        Kerry Kass [name] 05/06/2020, 6:08 AM

## 2020-05-06 NOTE — Progress Notes (Signed)
PHARMACY - PHYSICIAN COMMUNICATION CRITICAL VALUE ALERT - BLOOD CULTURE IDENTIFICATION (BCID)  Jasmine Hood is an 74 y.o. female who presented to [facility] on 05/05/2020 with a chief complaint of sepsis thought to be 2/2 to UTI.   Assessment:  Blood cultures with GNR and GPC growing in 2 out of 4 bottles. BCID positive for Klebsiella pneumonia (no KPC or ESBL resistance genes detected) and Staph epidermidis. Urine culture has been sent but no results yet. Patient has a history of streptococcus in the blood back in October with good susceptibilities. No history of MDR organisms noted. No allergies to antibiotics documented. MRSA PCR is negative.   Name of physician (or Provider) Contacted: Kennieth Rad, NP CCM  Current antibiotics: Cefepime 2g IV every 12 hours and Vancomycin 1250mg  IV every 24 hours -- Renally adjusted appropriately.  Changes to prescribed antibiotics recommended:  Recommendations accepted by provider  Narrow to Ceftriaxone 2g IV every 24 hours.    Results for orders placed or performed during the hospital encounter of 03/09/20  Blood Culture ID Panel (Reflexed) (Collected: 03/13/2020  2:53 PM)  Result Value Ref Range   Enterococcus faecalis NOT DETECTED NOT DETECTED   Enterococcus Faecium NOT DETECTED NOT DETECTED   Listeria monocytogenes NOT DETECTED NOT DETECTED   Staphylococcus species NOT DETECTED NOT DETECTED   Staphylococcus aureus (BCID) NOT DETECTED NOT DETECTED   Staphylococcus epidermidis NOT DETECTED NOT DETECTED   Staphylococcus lugdunensis NOT DETECTED NOT DETECTED   Streptococcus species DETECTED (A) NOT DETECTED   Streptococcus agalactiae NOT DETECTED NOT DETECTED   Streptococcus pneumoniae NOT DETECTED NOT DETECTED   Streptococcus pyogenes NOT DETECTED NOT DETECTED   A.calcoaceticus-baumannii NOT DETECTED NOT DETECTED   Bacteroides fragilis NOT DETECTED NOT DETECTED   Enterobacterales NOT DETECTED NOT DETECTED   Enterobacter cloacae complex NOT  DETECTED NOT DETECTED   Escherichia coli NOT DETECTED NOT DETECTED   Klebsiella aerogenes NOT DETECTED NOT DETECTED   Klebsiella oxytoca NOT DETECTED NOT DETECTED   Klebsiella pneumoniae NOT DETECTED NOT DETECTED   Proteus species NOT DETECTED NOT DETECTED   Salmonella species NOT DETECTED NOT DETECTED   Serratia marcescens NOT DETECTED NOT DETECTED   Haemophilus influenzae NOT DETECTED NOT DETECTED   Neisseria meningitidis NOT DETECTED NOT DETECTED   Pseudomonas aeruginosa NOT DETECTED NOT DETECTED   Stenotrophomonas maltophilia NOT DETECTED NOT DETECTED   Candida albicans NOT DETECTED NOT DETECTED   Candida auris NOT DETECTED NOT DETECTED   Candida glabrata NOT DETECTED NOT DETECTED   Candida krusei NOT DETECTED NOT DETECTED   Candida parapsilosis NOT DETECTED NOT DETECTED   Candida tropicalis NOT DETECTED NOT DETECTED   Cryptococcus neoformans/gattii NOT DETECTED NOT DETECTED   Sloan Leiter, PharmD, BCPS, BCCCP Clinical Pharmacist Please refer to [facility] for [place] numbers 05/06/2020  8:12 AM

## 2020-05-06 NOTE — Progress Notes (Addendum)
NAME:  Jasmine Hood, MRN:  [id number], DOB:  06-Feb-1946, LOS: 1 ADMISSION DATE:  05/05/2020, CONSULTATION DATE:  12/17 REFERRING MD:  Ronnald Nian- ED, CHIEF COMPLAINT:  Lactic acidosis  Brief History:  Sepsis due to UTI, stage 4 breast cancer  History of Present Illness:  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  Most history obtained from the patient's daughter Jasmine Hood over the phone.  She usually requires a significant amount assistance to get in and out of bed, and her daughter brought her walker to bedside per normal.  When she went to stand up she was unable to support herself on her legs and had a mild fall to the floor.  Her daughter was able to limit the severity of her fall but not fully support her.  She noted that her baseline tremor was slightly worse than normal and she was slurring her speech some.  She has a history of frequent urinary tract infections.  She presented to the ED with a fever of 102, leukocytosis, tachycardia.  She was given 3 L normal saline, vancomycin, Flagyl, cefepime after 1 set of blood cultures drawn.  Second set unable to be obtained due to difficult IV access.  She has an [place] MOST form signed at bedside.  She has previously told the ER physician she would not want a central line, would not want vasopressors, and she confirmed she wants to be DNR.  Past Medical History:  Metastatic breast cancer- first diagnosed in 2016 hypothyroidism  Significant Hospital Events:  12/18 admitted   Consults:    Procedures:    Significant Diagnostic Tests:  CTA C/A/P- right renal infarction, cystitis  Micro Data:  Blood cultures 12/17> 2/4 klebsiella and stap epidermis  Urine culture 12/17> MRSA 12/17 > neg  Antimicrobials:  vanc 12/17 Metronidazole 12/17 Cefepime 12/17> 12/18 Ceftriaxone 12/18 >>  Interim History / Subjective:  tmax 99.3 S/p 20 mg lasix overnight for  increasing SOB/ wheezing- diuresed 1L Lactic acid noted to be increasing despite 3L IVF; albumin given  Since, increased HR 140's, Tachypnea 40's, hypertensive, increased tremors Patient took her O2 off, doesn't want to wear it No complaints from patient other than wanting milk and something to drink.  Denies pain or difficulty breathing Net +387  Objective   Blood pressure (!) 157/112, pulse (!) 143, temperature 99.3 F (37.4 C), temperature source Oral, resp. rate (!) 36, weight 98.4 kg, SpO2 (!) 89 %.        Intake/Output Summary (Last 24 hours) at 05/06/2020 0740 Last data filed at 05/06/2020 0700 Gross per 24 hour  Intake 1497.23 ml  Output 1110 ml  Net 387.23 ml   Filed Weights   05/05/20 2030  Weight: 98.4 kg   Examination: General:  Chronically ill frail elderly female sitting upright in bed in moderate distress/ anxious/ tremulous  HEENT: MM pink/moist, anicteric, upper airway transmitted sounds/ stridor Neuro: Alert, answers appropriately, MAE, globally weak CV: rr, ST PULM:  Tachypneic, mildly labored, unable to purse lip breath consistently, no wheezing or rales noted GI: obese, soft, bs active  Extremities: warm/dry, no LE edema  Skin: no rashes, scattered ecchymoses  Resolved Hospital Problem list     Assessment & Plan:  Sepsis due to UTI, presumably cystitis based on CT Klebsiella and stap epidermidis bacteremia (pan sensitive  - MAP > 65, actually patient has been more hypertensive (possibly from  pulmonary edema vs renal infarct) - repeat lactic  - de-escalate to ceftriaxone, continue to follow UC - trend WBC/ fever curve   Hypoxic respiratory failure with concern for pulmonary edema vs ? VCD/ stridor - CTA neg for PE P:  Encouraging patient to wear supplemental O2, as she doesn't like it Repeat CXR  Doubt she would tolerate BiPAP and states she doesn't want it at this time, will keep offerring Hold on further lasix for now- no obvious rales on exam,  mostly upper airway sounds Defer racemic epi given HR Will trial small dose of morphine 1mg  to reduce preload and help with dypsnea If respiratory distress progressive with current DNI/ DNR and above does not help, will need to discuss with patient and daughter transition more to comfort care.  Attempted to call daughter for update, Jasmine Hood, at the only number we have [phone] but does not ring and voicemail not set up.    Lactic acidosis, presumably due to sepsis.  Potentially related to renal infarction +/- building lactic with resp distress - repeat lactic  - s/p volume resuscitation and albumin   AKI, likely due to sepsis NAGMA - strict I/O's  - stable sCr and great UOP - Renally dose meds and avoid nephrotoxic meds  Recent bacteremia, concern for embolic renal infarction.  She refused transesophageal echocardiogram during last admission. -pending transthoracic echocardiogram -follow blood cultures / trend WBC/ fever curve  -continue empiric heparin infusion for renal infarction  Elevated troponin and BNP, no ischemic changes on EKG.  Presumed to be due to sepsis. - continue tele monitoring  - troponin trend flat, likely due to demand   Hyponatremia - trend on BMET  Anemia, thrombocytopenia-both chronic - trend H/H, stable, no evidence of bleeding   Hyperglycemia -Sliding scale insulin sensitive -Goal BG 140-180  Hypothyroidism -Continue PTA Synthroid  Stage 4 metastatic breast cancer -Continue PTA Arimidex -[place] MOST form at bedside, very appropriate for her functional status   Best practice (evaluated daily)  Diet: Regular Pain/Anxiety/Delirium protocol (if indicated): n/a VAP protocol (if indicated): n/a DVT prophylaxis: Heparin drip GI prophylaxis: Pantoprazole Glucose control: Sliding scale Mobility: Progressive Disposition: ICU  Goals of Care:  Last date of multidisciplinary goals of care discussion: 12/17  Family and staff present: daughter  Jasmine Hood over the phone Summary of discussion: probably needs rehab at discharge before comes home, [name] MOST form verified Follow up goals of care discussion due:  Code Status: DNR- has MOST form signed  Labs   CBC: Recent Labs  Lab 05/05/20 1436 05/05/20 2338 05/06/20 0310  WBC 15.8*  --  12.2*  NEUTROABS 14.9*  --   --   HGB 9.3* 7.8* 8.8*  HCT 30.5* 23.0* 27.8*  MCV 103.0*  --  102.2*  PLT 130*  --  98*    Basic Metabolic Panel: Recent Labs  Lab 05/05/20 1436 05/05/20 1920 05/05/20 2338 05/06/20 0310  NA 131* 136 137 134*  K 4.9 3.5 4.0 4.0  CL 101 109  --  105  CO2 15* 14*  --  16*  GLUCOSE 239* 178*  --  162*  BUN 14 13  --  15  CREATININE 1.36* 0.84  --  0.88  CALCIUM 8.0* 6.1*  --  7.8*   GFR: Estimated Creatinine Clearance: 56.1 mL/min (by C-G formula based on SCr of 0.88 mg/dL). Recent Labs  Lab 05/05/20 1436 05/05/20 1543 05/05/20 1920 05/05/20 2314 05/06/20 0310  WBC 15.8*  --   --   --  12.2*  LATICACIDVEN  --  7.8* 4.0* 3.2* 4.9*    Liver Function Tests: Recent Labs  Lab 05/05/20 1436  AST 109*  ALT 37  ALKPHOS 110  BILITOT 1.2  PROT 6.8  ALBUMIN 2.1*   No results for input(s): LIPASE, AMYLASE in the last 168 hours. No results for input(s): AMMONIA in the last 168 hours.  ABG    Component Value Date/Time   PHART 7.389 05/05/2020 2338   PCO2ART 28.3 (L) 05/05/2020 2338   PO2ART 114 (H) 05/05/2020 2338   HCO3 17.2 (L) 05/05/2020 2338   TCO2 18 (L) 05/05/2020 2338   ACIDBASEDEF 7.0 (H) 05/05/2020 2338   O2SAT 99.0 05/05/2020 2338     Coagulation Profile: Recent Labs  Lab 05/05/20 1525  INR 1.4*    Cardiac Enzymes: Recent Labs  Lab 05/05/20 1436  CKTOTAL 45    HbA1C: Hgb A1c MFr Bld  Date/Time Value Ref Range Status  05/05/2020 11:14 PM 5.7 (H) 4.8 - 5.6 % Final    Comment:    (NOTE) Pre diabetes:          5.7%-6.4%  Diabetes:              >6.4%  Glycemic control for   <7.0% adults with diabetes    03/11/2020 04:08 AM 6.5 (H) 4.8 - 5.6 % Final    Comment:    (NOTE)         Prediabetes: 5.7 - 6.4         Diabetes: >6.4         Glycemic control for adults with diabetes: <7.0     CBG: Recent Labs  Lab 05/05/20 1402 05/05/20 2050 05/05/20 2337 05/06/20 0333  GLUCAP 216* 200* 148* 177*       Critical care time: 35 min.    Kennieth Rad, ACNP [facility] 05/06/2020, 8:09 AM

## 2020-05-06 NOTE — Progress Notes (Signed)
*  PRELIMINARY RESULTS* Echocardiogram 2D Echocardiogram has been performed.  Jasmine Hood 05/06/2020, 1:56 PM

## 2020-05-06 NOTE — Progress Notes (Signed)
[facility] for Heparin Indication: Renal infarct   Allergies  Allergen Reactions  . Bee Venom Anaphylaxis  . Latex Swelling    Patient Measurements: Height: 4\' 11"  (149.9 cm) Weight: 98.3 kg (216 lb 11.4 oz) IBW/kg (Calculated) : 43.2 Heparin Dosing Weight: 66.7 kg  Vital Signs: Temp: 98.4 F (36.9 C) (12/18 2328) Temp Source: Oral (12/18 2328) BP: 136/44 (12/18 2300) Pulse Rate: 104 (12/18 2300)  Labs: Recent Labs    05/05/20 1436 05/05/20 1525 05/05/20 1540 05/05/20 1920 05/05/20 2338 05/06/20 0310 05/06/20 0918 05/06/20 1204 05/06/20 2244  HGB 9.3*  --   --   --  7.8* 8.8*  --   --   --   HCT 30.5*  --   --   --  23.0* 27.8*  --   --   --   PLT 130*  --   --   --   --  98*  --   --   --   APTT  --  35  --   --   --   --   --   --   --   LABPROT  --  16.2*  --   --   --   --   --   --   --   INR  --  1.4*  --   --   --   --   --   --   --   HEPARINUNFRC  --   --   --   --   --  0.22*  --  0.28* 0.15*  CREATININE 1.36*  --   --  0.84  --  0.88  --   --   --   CKTOTAL 45  --   --   --   --   --   --   --   --   TROPONINIHS 381*  --  317*  --   --   --  365*  --   --     Estimated Creatinine Clearance: 56 mL/min (by C-G formula based on SCr of 0.88 mg/dL).  Assessment: 74 y.o. female with possible renal infarction for heparin.   Heparin level is slightly low but trending up at 0.28 on 1200 units/hr.  Platelets stable compared with prior labs.  Hemoglobin 8.8.  No overt bleeding noted.   12/19 AM update:  Heparin level dropped some despite rate increase Infusing good per RN  Goal of Therapy:  Heparin level 0.3-0.7 units/ml Monitor platelets by anticoagulation protocol: Yes   Plan:  Inc heparin to 1500 units/hr Re-check heparin level in 8 hours  Narda Bonds, PharmD, [place] Pharmacist Phone: [phone]

## 2020-05-06 NOTE — Progress Notes (Signed)
[facility] for Heparin Indication: Renal infarct   Allergies  Allergen Reactions  . Bee Venom Anaphylaxis  . Latex Swelling    Patient Measurements: Height: 4\' 11"  (149.9 cm) Weight: 98.3 kg (216 lb 11.4 oz) IBW/kg (Calculated) : 43.2 Heparin Dosing Weight: 66.7 kg  Vital Signs: Temp: 98.9 F (37.2 C) (12/18 1115) Temp Source: Oral (12/18 1115) BP: 119/55 (12/18 1130) Pulse Rate: 120 (12/18 1130)  Labs: Recent Labs    05/05/20 1436 05/05/20 1525 05/05/20 1540 05/05/20 1920 05/05/20 2338 05/06/20 0310 05/06/20 0918 05/06/20 1204  HGB 9.3*  --   --   --  7.8* 8.8*  --   --   HCT 30.5*  --   --   --  23.0* 27.8*  --   --   PLT 130*  --   --   --   --  98*  --   --   APTT  --  35  --   --   --   --   --   --   LABPROT  --  16.2*  --   --   --   --   --   --   INR  --  1.4*  --   --   --   --   --   --   HEPARINUNFRC  --   --   --   --   --  0.22*  --  0.28*  CREATININE 1.36*  --   --  0.84  --  0.88  --   --   CKTOTAL 45  --   --   --   --   --   --   --   TROPONINIHS 381*  --  317*  --   --   --  365*  --     Estimated Creatinine Clearance: 56 mL/min (by C-G formula based on SCr of 0.88 mg/dL).  Assessment: 74 y.o. female with possible renal infarction for heparin.   Heparin level is slightly low but trending up at 0.28 on 1200 units/hr.  Platelets stable compared with prior labs.  Hemoglobin 8.8.  No overt bleeding noted.   Goal of Therapy:  Heparin level 0.3-0.7 units/ml Monitor platelets by anticoagulation protocol: Yes   Plan:  Increase Heparin to 1350 units/hr Check heparin level in 8 hours.   Brain Hilts PharmD. BCPS  05/06/2020,1:08 PM

## 2020-05-06 NOTE — Progress Notes (Signed)
[facility] Progress Note Patient Name: Jasmine Hood DOB: July 11, 1945 MRN: [id number]   Date of Service  05/06/2020  HPI/Events of Note  Lactic acid up to 4.9 from 3.2, patient had previously received 3 liters of iv fluid in the ED and developed shortness of breath raising concern for pulmonary edema. She got Lasix 20 mg  iv x 1.  eICU Interventions  Will order Albumin 5 % 500 ml iv bolus over 2 hours.        [name] 05/06/2020, 4:41 AM

## 2020-05-07 ENCOUNTER — Inpatient Hospital Stay (HOSPITAL_COMMUNITY): Payer: Medicare Other

## 2020-05-07 DIAGNOSIS — Z66 Do not resuscitate: Secondary | ICD-10-CM

## 2020-05-07 DIAGNOSIS — Z7189 Other specified counseling: Secondary | ICD-10-CM

## 2020-05-07 DIAGNOSIS — Z515 Encounter for palliative care: Secondary | ICD-10-CM

## 2020-05-07 DIAGNOSIS — M7989 Other specified soft tissue disorders: Secondary | ICD-10-CM

## 2020-05-07 LAB — PHOSPHORUS: Phosphorus: 4.2 mg/dL (ref 2.5–4.6)

## 2020-05-07 LAB — GLUCOSE, CAPILLARY
Glucose-Capillary: 123 mg/dL — ABNORMAL HIGH (ref 70–99)
Glucose-Capillary: 135 mg/dL — ABNORMAL HIGH (ref 70–99)
Glucose-Capillary: 141 mg/dL — ABNORMAL HIGH (ref 70–99)
Glucose-Capillary: 142 mg/dL — ABNORMAL HIGH (ref 70–99)
Glucose-Capillary: 150 mg/dL — ABNORMAL HIGH (ref 70–99)
Glucose-Capillary: 187 mg/dL — ABNORMAL HIGH (ref 70–99)

## 2020-05-07 LAB — BASIC METABOLIC PANEL
Anion gap: 9 (ref 5–15)
BUN: 20 mg/dL (ref 8–23)
CO2: 18 mmol/L — ABNORMAL LOW (ref 22–32)
Calcium: 7 mg/dL — ABNORMAL LOW (ref 8.9–10.3)
Chloride: 101 mmol/L (ref 98–111)
Creatinine, Ser: 0.69 mg/dL (ref 0.44–1.00)
GFR, Estimated: >60 mL/min (ref >60)
Glucose, Bld: 156 mg/dL — ABNORMAL HIGH (ref 70–99)
Potassium: 4 mmol/L (ref 3.5–5.1)
Sodium: 128 mmol/L — ABNORMAL LOW (ref 135–145)

## 2020-05-07 LAB — CBC
HCT: 23.3 % — ABNORMAL LOW (ref 36.0–46.0)
Hemoglobin: 7.4 g/dL — ABNORMAL LOW (ref 12.0–15.0)
MCH: 31.5 pg (ref 26.0–34.0)
MCHC: 31.8 g/dL (ref 30.0–36.0)
MCV: 99.1 fL (ref 80.0–100.0)
Platelets: 68 10*3/uL — ABNORMAL LOW (ref 150–400)
RBC: 2.35 MIL/uL — ABNORMAL LOW (ref 3.87–5.11)
RDW: 16 % — ABNORMAL HIGH (ref 11.5–15.5)
WBC: 8 10*3/uL (ref 4.0–10.5)
nRBC: 0 % (ref 0.0–0.2)

## 2020-05-07 LAB — HEMOGLOBIN AND HEMATOCRIT, BLOOD
HCT: 21.1 % — ABNORMAL LOW (ref 36.0–46.0)
HCT: 26 % — ABNORMAL LOW (ref 36.0–46.0)
Hemoglobin: 6.6 g/dL — CL (ref 12.0–15.0)
Hemoglobin: 8.3 g/dL — ABNORMAL LOW (ref 12.0–15.0)

## 2020-05-07 LAB — URINE CULTURE: Culture: NO GROWTH

## 2020-05-07 LAB — HEPARIN LEVEL (UNFRACTIONATED)
Heparin Unfractionated: <0.1 IU/mL — ABNORMAL LOW (ref 0.30–0.70)
Heparin Unfractionated: 0.22 IU/mL — ABNORMAL LOW (ref 0.30–0.70)

## 2020-05-07 LAB — MAGNESIUM: Magnesium: 2.1 mg/dL (ref 1.7–2.4)

## 2020-05-07 LAB — PREPARE RBC (CROSSMATCH)

## 2020-05-07 MED ORDER — CITALOPRAM HYDROBROMIDE 10 MG PO TABS
10.0000 mg | ORAL_TABLET | Freq: Every day | ORAL | Status: DC
  Administered 2020-05-08 – 2020-05-16 (×9): 10 mg via ORAL
  Filled 2020-05-07 (×10): qty 1

## 2020-05-07 MED ORDER — ALBUTEROL SULFATE (2.5 MG/3ML) 0.083% IN NEBU
2.5000 mg | INHALATION_SOLUTION | RESPIRATORY_TRACT | Status: DC | PRN
  Administered 2020-05-07 – 2020-05-12 (×5): 2.5 mg via RESPIRATORY_TRACT
  Filled 2020-05-07 (×4): qty 3

## 2020-05-07 MED ORDER — LIDOCAINE 5 % EX PTCH
1.0000 | MEDICATED_PATCH | CUTANEOUS | Status: DC
  Administered 2020-05-07 – 2020-05-16 (×10): 1 via TRANSDERMAL
  Filled 2020-05-07 (×9): qty 1

## 2020-05-07 MED ORDER — SODIUM CHLORIDE 0.9 % IV SOLN
INTRAVENOUS | Status: DC | PRN
  Administered 2020-05-07: 250 mL via INTRAVENOUS

## 2020-05-07 MED ORDER — SODIUM CHLORIDE 0.9% IV SOLUTION: Freq: Once | INTRAVENOUS | Status: AC

## 2020-05-07 MED ORDER — ACETAMINOPHEN 325 MG PO TABS
650.0000 mg | ORAL_TABLET | Freq: Three times a day (TID) | ORAL | Status: DC
  Administered 2020-05-07 – 2020-05-16 (×26): 650 mg via ORAL
  Filled 2020-05-07 (×28): qty 2

## 2020-05-07 MED ORDER — HEPARIN SODIUM (PORCINE) 5000 UNIT/ML IJ SOLN
5000.0000 [IU] | Freq: Three times a day (TID) | INTRAMUSCULAR | Status: DC
  Administered 2020-05-08 – 2020-05-14 (×13): 5000 [IU] via SUBCUTANEOUS
  Filled 2020-05-07 (×14): qty 1

## 2020-05-07 MED ORDER — TRAZODONE HCL 50 MG PO TABS: 50.0000 mg | ORAL_TABLET | Freq: Every evening | ORAL | Status: DC | PRN

## 2020-05-07 NOTE — Progress Notes (Signed)
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  Most history obtained from the patient's daughter [name] over the phone.  She usually requires a significant amount assistance to get in and out of bed, and her daughter brought her walker to bedside per normal.  When she went to stand up she was unable to support herself on her legs and had a mild fall to the floor.  Her daughter was able to limit the severity of her fall but not fully support her.  She noted that her baseline tremor was slightly worse than normal and she was slurring her speech some.  She has a history of frequent urinary tract infections.  She presented to the ED with a fever of 102, leukocytosis, tachycardia.  She was given 3 L normal saline, vancomycin, Flagyl, cefepime after 1 set of blood cultures drawn.  Second set unable to be obtained due to difficult IV access.  She has an [place] MOST form signed at bedside.  She has previously told the ER physician she would not want a central line, would not want vasopressors, and she confirmed she wants to be DNR.  Past Medical History:  Metastatic breast cancer- first diagnosed in 2016 hypothyroidism  Significant Hospital Events:  12/18 admitted   Consults:    Procedures:  12/18 LUE midline >>  Significant Diagnostic Tests:  CTA C/A/P- right renal infarction, cystitis  12/18 TTE >> 1. Left ventricular ejection fraction, by estimation, is 60 to 65%. The  left ventricle has normal function. The left ventricle has no regional  wall motion abnormalities. There is mild concentric left ventricular  hypertrophy. Indeterminate  diastolic  filling due to E-A fusion.  2. Right ventricular systolic function is normal. The right ventricular  size is normal.  3. The mitral valve is normal in structure. Mild mitral valve  regurgitation.  4. The aortic valve is tricuspid. There is mild calcification of the  aortic valve. There is mild thickening of the aortic valve. Aortic valve  regurgitation is mild to moderate. Mild aortic valve sclerosis is present,  with no evidence of aortic valve  stenosis.  5. The inferior vena cava is normal in size with greater than 50%  respiratory variability, suggesting right atrial pressure of 3 mmHg.   Micro Data:  Blood cultures 12/17> 2/4 klebsiella and stap epidermis  Urine culture 12/17> MRSA 12/17 > neg BC 12/18 >>  Antimicrobials:  vanc 12/17 Metronidazole 12/17 Cefepime 12/17> 12/18 Ceftriaxone 12/18 >>  Interim History / Subjective:  Afebrile +720/ +1.1 Hemodynamically stable Episode of wheezing, coughing, and shortness of breath overnight, requiring up to NRB and nebs, improved.   Currently on 4L [place]  LUE Midline placed for better access  Hgb trend 9.3-> 7.8-> 8.8-> 7.4 Plt 130-> 98-> 68 Heparin level remains < 0.1 Some swelling and ecchymosis of LUE, however IV appear patent  Patient complains of chronic mid back pain, denies SOB  Objective   Blood pressure (!) 110/45, pulse (!) 113, temperature 99.5 F (37.5 C), temperature source Oral, resp. rate (!) 38, height 4\' 11"  (1.499 m), weight 99.4 kg, SpO2  99 %.        Intake/Output Summary (Last 24 hours) at 05/07/2020 0805 Last data filed at 05/07/2020 0600 Gross per 24 hour  Intake 1320.15 ml  Output 600 ml  Net 720.15 ml   Filed Weights   05/05/20 2030 05/06/20 1015 05/07/20 0359  Weight: 98.4 kg 98.3 kg 99.4 kg   Examination: General:  Frail, chronically ill elderly female sitting upright in bed in NAD, asking for drink HEENT: MM pink/moist, pupils 4/reactive, some expiratory transmitted wheezing   Neuro: Awake, alert/ oriented CV: ST, no murmur PULM:  Ongoing shallow tachypnea in the 30's, non labored, mostly clear, slight left upper wheeze, diminished in bases, faint crackles -? Atelectasis  GI: obese, +bs, NT/ ND Extremities: warm/dry, LUE edema with ecchymosis and tenderness- has left AC and midline- both with brisk blood return   Resolved Hospital Problem list     Assessment & Plan:   Sepsis due to UTI, presumably cystitis based on CT Klebsiella and stap epidermidis bacteremia  P:  Has remained hemodynamically stable Lactic improved after gentle fluid resuscitation Stable UOP/ sCr Continue ceftriaxone Follow repeat cultures  Stable to transfer to PCU and to [facility] as of 12/20 Remains DNR/ DNI w/ no vasopressor or central access    Hypoxic respiratory failure with concern for pulmonary edema vs ? VCD/ stridor Possible component of atelectasis vs infiltrate  - CTA neg for PE P:  Continue to wean supplemental O2 for sat goal > 92 CXR 12/18 improved, patchy airspace disese bilaterally; mild component of interstitial pulmonary edema  Consider small dose of lasix - although suspect faint rales is just atelectasis  Continue BD/ prn  Needs aggressive pulm hygiene with IS and will order PT Continue abx as above  Consider possible ENT eval if upper airway stridor ongoing   Lactic acidosis, presumably due to sepsis.  Potentially related to renal infarction +/- building lactic with resp distress P:  Lactate clearing yesterday   AKI, likely due to sepsis- resolved  NAGMA- improving  P:  Continue to trend renal indices, strict I/O's Renally dose meds and avoid nephrotoxic meds   Recent bacteremia, concern for embolic renal infarction.  She refused transesophageal echocardiogram during last admission. P:  No evidence of IE on TTE 12/18, EF 60-65%, normal RV, indeterminate diastolic Doubt with tenuous respiratory status and overall fragility she would be a candidate for  TEE/ sedation  Continue empiric heparin infusion for renal infarction; will have to determine long term AC given she is a high fall risk Continue abx as above   Elevated troponin and BNP, no ischemic changes on EKG.  Presumed to be due to sepsis. P:  Tele monitoring    Hyponatremia P:  Na 134-> 128 Repeat in AM, monitor closely No neurological changes, continue to monitor   Anemia, thrombocytopenia-both chronic, worsening today  P : Hgb trend 9.3-> 7.8-> 8.8-> 7.4 Plt 130-> 98-> 68 ? Hematoma in LUE, possibly from midline placement, on heparin gtt.  Day 2.5 of heparin but was hospitalized in October, out of 30 days, HIT score low/ 2 Will recheck H/H at 1400 Send T&S, transfuse for Hgb <7 Continue to monitor for s/s bleeding Check LUE to rule out DVT (doubtful given heparin gtt)   Hyperglycemia P:  Remains controlled Continue SSI sensitive for goal glucose 140-180   Hypothyroidism P:  Continue home synthroid   Stage 4 metastatic breast cancer to spine P:  Lidocaine patch for chronic back pain  Continue PTA Arimidex [place]  MOST form at bedside, very appropriate for her functional status   Overall debility/ poor functional status P:  Will order PT Consult PMT not for GOC but ongoing symptom management   Best practice (evaluated daily)  Diet: Regular Pain/Anxiety/Delirium protocol (if indicated): n/a VAP protocol (if indicated): n/a DVT prophylaxis: Heparin drip GI prophylaxis: Pantoprazole Glucose control: Sliding scale Mobility: Progressive Disposition: ICU-> tx to PCU  Will try and update daughter today, unable to reach by phone.    Goals of Care:  Last date of multidisciplinary goals of care discussion: 12/17  Family and staff present: daughter [name] over the phone Summary of discussion: probably needs rehab at discharge before comes home, [place] MOST form verified Follow up goals of care discussion due:  Code Status: DNR- has MOST form signed  Labs    CBC: Recent Labs  Lab 05/05/20 1436 05/05/20 2338 05/06/20 0310 05/07/20 0327  WBC 15.8*  --  12.2* 8.0  NEUTROABS 14.9*  --   --   --   HGB 9.3* 7.8* 8.8* 7.4*  HCT 30.5* 23.0* 27.8* 23.3*  MCV 103.0*  --  102.2* 99.1  PLT 130*  --  98* 68*    Basic Metabolic Panel: Recent Labs  Lab 05/05/20 1436 05/05/20 1920 05/05/20 2338 05/06/20 0310 05/06/20 0918 05/07/20 0327  NA 131* 136 137 134*  --  128*  K 4.9 3.5 4.0 4.0  --  4.0  CL 101 109  --  105  --  101  CO2 15* 14*  --  16*  --  18*  GLUCOSE 239* 178*  --  162*  --  156*  BUN 14 13  --  15  --  20  CREATININE 1.36* 0.84  --  0.88  --  0.69  CALCIUM 8.0* 6.1*  --  7.8*  --  7.0*  MG  --   --   --   --  1.2* 2.1  PHOS  --   --   --   --  2.2* 4.2   GFR: Estimated Creatinine Clearance: 62.1 mL/min (by C-G formula based on SCr of 0.69 mg/dL). Recent Labs  Lab 05/05/20 1436 05/05/20 1543 05/05/20 2314 05/06/20 0310 05/06/20 0918 05/06/20 1952 05/07/20 0327  WBC 15.8*  --   --  12.2*  --   --  8.0  LATICACIDVEN  --    < > 3.2* 4.9* 5.3* 2.6*  --    < > = values in this interval not displayed.    Liver Function Tests: Recent Labs  Lab 05/05/20 1436  AST 109*  ALT 37  ALKPHOS 110  BILITOT 1.2  PROT 6.8  ALBUMIN 2.1*   No results for input(s): LIPASE, AMYLASE in the last 168 hours. No results for input(s): AMMONIA in the last 168 hours.  ABG    Component Value Date/Time   PHART 7.389 05/05/2020 2338   PCO2ART 28.3 (L) 05/05/2020 2338   PO2ART 114 (H) 05/05/2020 2338   HCO3 17.2 (L) 05/05/2020 2338   TCO2 18 (L) 05/05/2020 2338   ACIDBASEDEF 7.0 (H) 05/05/2020 2338   O2SAT 99.0 05/05/2020 2338     Coagulation Profile: Recent Labs  Lab 05/05/20 1525  INR 1.4*    Cardiac Enzymes: Recent Labs  Lab 05/05/20 1436  CKTOTAL 45    HbA1C: Hgb A1c MFr Bld  Date/Time Value Ref Range Status  05/05/2020 11:14 PM 5.7 (H) 4.8 - 5.6 % Final    Comment:    (NOTE) Pre diabetes:  5.7%-6.4%  Diabetes:              >6.4%  Glycemic control for   <7.0% adults with diabetes   03/11/2020 04:08 AM 6.5 (H) 4.8 - 5.6 % Final    Comment:    (NOTE)         Prediabetes: 5.7 - 6.4         Diabetes: >6.4         Glycemic control for adults with diabetes: <7.0     CBG: Recent Labs  Lab 05/06/20 1514 05/06/20 1910 05/06/20 2321 05/07/20 0350 05/07/20 [place], ACNP [facility] 05/07/2020, 8:05 AM

## 2020-05-07 NOTE — Therapy (Signed)
Called to assesed pt for desaturation and increased WOB. On arrival the nurse had given prn albuterol and pt saturation was in the low 70's. Pt was placed in HFNC at 15L with 15L NRB. Will continue to monitor and give tratments as needed.

## 2020-05-07 NOTE — Progress Notes (Signed)
  Notified Hgb 6.6.  Heparin gtt d/c earlier. remains normotensive.  P:  Transfuse 1 unit PRBC with post transfusion H/H.  Will continue to monitor     Kennieth Rad, ACNP [facility] 05/07/2020, 2:01 PM

## 2020-05-07 NOTE — Progress Notes (Signed)
CRITICAL VALUE ALERT  Critical Value:  6.6  Date & Time Notied:  05/07/20  Provider Notified: P. Moshe Cipro, NP  Orders Received/Actions taken: Awaiting new orders.

## 2020-05-07 NOTE — Progress Notes (Signed)
Upon assessment, patient found wheezing. RT was contacted for a breathing treatment. Patient then proceeded to have a coughing spell that caused her to become tachy in the 130's and desat to the 70's. O2 was turned up from 2L to 6L.PRN albuterol was given. RT switched patient to HFNC 15L along with NRB 15L. Will continue to monitor closely.   Jackalyn Lombard

## 2020-05-07 NOTE — Progress Notes (Signed)
Left upper extremity venous duplex has been completed. Preliminary results can be found in CV Proc through chart review.   05/07/20 10:49 AM Jasmine Hood RVT

## 2020-05-07 NOTE — Consult Note (Signed)
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  Admitted 12/17 after a fall.  She had apparently been experiencing some increased tremor, some slurred speech preceding the fall.  Hypotensive in the emergency department and found to have Klebsiella and staph epi bacteremia, significant lactic acidosis with a probable superimposed nongap acidosis.  Volume resuscitation initiated but she had a period of tachypnea, respiratory distress and this prompted Lasix x1 overnight.  CT scan of her chest did not show any evidence pulmonary embolism, patchy peribronchovascular peripheral reticulation and groundglass, question infiltrate versus edema versus atelectasis.  CT abdomen with a segmental perfusion defect in the anterior mid to lower right kidney suggestive of a possible acute renal infarct. Her code status was confirmed as DNR, no CVC, no pressors.  Palliative care was asked to get involved to aid in [place] conversations.   Clinical Assessment/Goals of Care: I have reviewed medical records including EPIC notes, labs and imaging, received report from bedside RN, assessed the patient who was lying in bed in no distress noted to be on 6LPM [place].    I met with Ermie Glendenning to further discuss diagnosis prognosis, GOC, EOL wishes, disposition and options.   I introduced Palliative Medicine as specialized medical care for people living with serious illness. It focuses on providing relief from the symptoms and stress of a serious illness. The goal is to improve quality of life for both the patient and the family.  Shaneal shares that she from [place] initially. is living in [place], [place]. She is a widow and has been married twice. She shares that she had six children (one was a son who died at birth) [name] "[name]",  [place], [place]is [place]berg and [place]. Two of her children live locally and the rest live in [place]. She enjoys going to the [place] to putter around. She is not overtly religious.   Keira shares that she is fully functional and have been living in a trailer home with her daughter Juliann Pulse for > 1 decade.   A detailed discussion was had today regarding advanced directives.  Patient has never completed these though would be interested in doing so while here.   Concepts specific to code status, artifical feeding and hydration, continued IV antibiotics and rehospitalization was had.  I completed a MOST form today. The patient and family outlined their wishes for the following treatment decisions:  Cardiopulmonary Resuscitation: Do Not Attempt Resuscitation (DNR/No CPR)  Medical Interventions: Limited Additional Interventions: Use medical treatment, IV fluids and cardiac monitoring as indicated, DO NOT USE intubation or mechanical ventilation. May consider use of less invasive airway support such as BiPAP or CPAP. Also provide comfort measures. Transfer to the hospital if indicated. Avoid intensive care.   Antibiotics: Determine use of limitation of antibiotics when infection occurs  IV Fluids: IV fluids for a defined trial period  Feeding Tube: No feeding tube   Patient herself states that her personal goals are to gain strength and get to a more functional level. She shares that home health physical therapy has been seeing her. She does not endorse that she had made tremendous gains on her present PT/OT regiment.   Discussed the importance of continued conversation with family and their  medical providers regarding overall plan of care and treatment options, ensuring decisions are within the context of  the patients values and GOCs. _________________________________________________________________________________________________ Addendum: I called patients friend on the chart, Mardene Celeste (have  been friends for > 50 years) to corroborate all of the above information which is accurate. She recommended that in the absence of getting touch with Joneen Roach" that I reach out to her son [name] who was supposed to be her HCPOA but the paperwork was never completed.   I was able to get in touch with [name] this afternoon. He states that he had last seen his mother in November though she had Covid-19 at that time so he was not able to see her other than through the door of the house.    From a mobility perspective patient has declined over the past year per [facility] she has been lying in the bed more frequently and using a bedside commode in the living room to relieve herself.Deaundra been more resistant towards working with therapy in the home and has a tendency to sleep most of the day.   In regard to patients daughter, [name] that she lived with. She too suffers her own health ailments. She is also noted by [name] to be very passive and will not "push" Juliann Pulse to do things.  [name] expresses concern that unless Phelan goes to a rehabilitation that she will continue to worsen.  I asked him if he would be interested in speaking to her about this via E-Link. He shared that he would.   April, RN was able to arrange an E-Link Vist.  I spoke to [place] and the patient during this time. Osiris agree's that she is quite debilitated. She is open to skilled nursing given her present goals. We discussed that if things do not improve considering hospice.  I described hospice as a service for patients for have a life expectancy of < 39month. It preserves dignity and quality at the end phases of life. The focus changes from curative to symptom relief.   HCPOA documents were also completed. We will request that EHarrie Foremancoordinates with notarizing these tomorrow. Patient endorses wishing for KLennette Biharito make decisions for her if she is incapacitated.   Decision Maker: KMumtaz Lovins(son) [phone] SUMMARY OF  RECOMMENDATIONS   DNAR/DNI  MOST Completed, paper copy placed onto the chart electric [facility] Completed, paper copy placed onto the chart electric [facility] appreciate Chaplain aiding in getting it notarized  Patient realizes that she will need to go to rehabilitation for strength - if no improvements there she would be open to home hospice  TOC - [facility] DNAR/DNI   Symptom Management:  Lower Back Pain:  - Tylenol ATC  Depression:  - Start low dose citalopram  Insomnia:  - Trazodone 552mPO QHS  Muscular Weakness:  - PT/OT Eval   Palliative Prophylaxis:   Oral Care, Mobility, delirium precuations  Additional Recommendations (Limitations, Scope, Preferences):  Treat what is treatable - no heroic measures  Psycho-social/Spiritual:   Desire for further Chaplaincy support: No  Additional Recommendations: Education on chronic disease processes   Prognosis: Poor < 12 months in the setting of chronic comorbid conditions and recurrent rehospitalizations  Discharge Planning: Discharge to skilled nursing with OP Palliative support  Vitals:   05/07/20 1000 05/07/20 1030  BP: (!) 118/45 (!) 109/49  Pulse: (!) 103 (!) 103  Resp: (!) 34 (!) 26  Temp:    SpO2: 97% 97%    Intake/Output Summary (Last 24 hours) at 05/07/2020 1143 Last data filed at 05/07/2020 1100 Gross per 24 hour  Intake 1594.4 ml  Output 600 ml  Net 994.4 ml   Last Weight  Most recent update: 05/07/2020  4:00 AM   Weight  99.4 kg (219 lb 2.2 oz)           Gen: Elderly caucasian F in NAD HEENT: moist mucous membranes CV: Regular rate and rhythm  PULM: 6LPM [place], clear to auscultation bilaterally  ABD: soft/nontender  EXT: Pedal edema Neuro: Alert and oriented x3  PPS: 30%   This conversation/these recommendations were discussed with  patient primary care team, Dr. Lamonte Sakai  Time In: 1300 Time Out:  1500 Total Time: 120 Greater than 50%  of this time was spent counseling and coordinating care related to the above assessment and plan.  [place] Team Team Cell Phone: [phone] Please utilize secure chat with additional questions, if there is no response within 30 minutes please call the above phone number  Palliative Medicine Team providers are available by phone from 7am to 7pm daily and can be reached through the team cell phone.  Should this patient require assistance outside of these hours, please call the patient's attending physician.

## 2020-05-07 NOTE — Progress Notes (Signed)
Reassessed patient. No longer having shortness or breath or increased work of breathing. O2 sat at 100%, heart rate down to the 110's, no wheezing heard, and states she feels better. Patient now on HFNC at [place]. Will continue to monitor.   Jackalyn Lombard

## 2020-05-08 ENCOUNTER — Telehealth: Payer: Self-pay | Admitting: Internal Medicine

## 2020-05-08 LAB — CBC
HCT: 26.8 % — ABNORMAL LOW (ref 36.0–46.0)
Hemoglobin: 8.4 g/dL — ABNORMAL LOW (ref 12.0–15.0)
MCH: 29.9 pg (ref 26.0–34.0)
MCHC: 31.3 g/dL (ref 30.0–36.0)
MCV: 95.4 fL (ref 80.0–100.0)
Platelets: 55 10*3/uL — ABNORMAL LOW (ref 150–400)
RBC: 2.81 MIL/uL — ABNORMAL LOW (ref 3.87–5.11)
RDW: 18.2 % — ABNORMAL HIGH (ref 11.5–15.5)
WBC: 7.3 10*3/uL (ref 4.0–10.5)
nRBC: 0 % (ref 0.0–0.2)

## 2020-05-08 LAB — GLUCOSE, CAPILLARY
Glucose-Capillary: 109 mg/dL — ABNORMAL HIGH (ref 70–99)
Glucose-Capillary: 114 mg/dL — ABNORMAL HIGH (ref 70–99)
Glucose-Capillary: 135 mg/dL — ABNORMAL HIGH (ref 70–99)
Glucose-Capillary: 152 mg/dL — ABNORMAL HIGH (ref 70–99)
Glucose-Capillary: 153 mg/dL — ABNORMAL HIGH (ref 70–99)
Glucose-Capillary: 98 mg/dL (ref 70–99)

## 2020-05-08 LAB — CULTURE, BLOOD (SINGLE): Special Requests: ADEQUATE

## 2020-05-08 LAB — BPAM RBC
Blood Product Expiration Date: 202201182359
ISSUE DATE / TIME: 202112191446
Unit Type and Rh: 5100

## 2020-05-08 LAB — TYPE AND SCREEN
ABO/RH(D): O POS
Antibody Screen: NEGATIVE
Unit division: 0

## 2020-05-08 LAB — BASIC METABOLIC PANEL
Anion gap: 8 (ref 5–15)
BUN: 23 mg/dL (ref 8–23)
CO2: 20 mmol/L — ABNORMAL LOW (ref 22–32)
Calcium: 7.2 mg/dL — ABNORMAL LOW (ref 8.9–10.3)
Chloride: 103 mmol/L (ref 98–111)
Creatinine, Ser: 0.63 mg/dL (ref 0.44–1.00)
GFR, Estimated: >60 mL/min (ref >60)
Glucose, Bld: 120 mg/dL — ABNORMAL HIGH (ref 70–99)
Potassium: 4.1 mmol/L (ref 3.5–5.1)
Sodium: 131 mmol/L — ABNORMAL LOW (ref 135–145)

## 2020-05-08 LAB — RESP PANEL BY RT-PCR (FLU A&B, COVID) ARPGX2
Influenza A by PCR: NEGATIVE
Influenza B by PCR: NEGATIVE
SARS Coronavirus 2 by RT PCR: NEGATIVE

## 2020-05-08 NOTE — Evaluation (Signed)
Physical Therapy Evaluation Patient Details Name: Jasmine Hood MRN: [id number] DOB: 11-25-1945 Today's Date: 05/08/2020   History of Present Illness  74 y/o woman with a history of metastatic breast cancer to her spine admitted 12/17 after a fall. Pt with Klebsiella and staph epi bacteremia with suspected right renal infarct. 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 Pt declined OOB to chair and demonstrates decreased strength, transfers, gait, functional mobility, balance and cardiopulmonary function who will benefit from acute therapy to maximize independence and function. Pt reports progressive weakness and need for assist at baseline with agreement to short term SNf in order to decrease burden on family.  HR 87-104 86% on rA with activity, 92% on 3L    Follow Up Recommendations SNF;Supervision/Assistance - 24 hour    Equipment Recommendations  None recommended by PT    Recommendations for Other Services       Precautions / Restrictions Precautions Precautions: Fall Precaution Comments: watch sats Restrictions Weight Bearing Restrictions: No      Mobility  Bed Mobility Overal bed mobility: Needs Assistance Bed Mobility: Supine to Sit     Supine to sit: Min assist;HOB elevated     General bed mobility comments: HOB 25 degrees with cues to pivot toward left side of bed with use of rail, assist to elevate trunk, increased time and mod assist to fully scoot to EOB    Transfers Overall transfer level: Needs assistance   Transfers: Sit to/from Stand Sit to Stand: Min guard         General transfer comment: cues for hand placement with pt able to rise from bed and recliner total of 4 trials. pt declined staying in chair end of session despite education for benefit  Ambulation/Gait Ambulation/Gait assistance: Min assist;+2  safety/equipment Gait Distance (Feet): 40 Feet Assistive device: Rolling walker (2 wheeled) Gait Pattern/deviations: Step-through pattern;Decreased stride length;Trunk flexed   Gait velocity interpretation: 1.31 - 2.62 ft/sec, indicative of limited community ambulator General Gait Details: cues for posture, position in RW, safety and close chair follow. Distance limited by desaturation with need for 3L to maintain 92%. Pt walked 20' then 28' with seated rest  Stairs            Wheelchair Mobility    Modified Rankin (Stroke Patients Only)       Balance Overall balance assessment: Needs assistance Sitting-balance support: Feet supported Sitting balance-Leahy Scale: Poor Sitting balance - Comments: pt able to statically sit minguard but with movement requires assist   Standing balance support: Bilateral upper extremity supported Standing balance-Leahy Scale: Poor Standing balance comment: RW with bil UE support in standing                             Pertinent Vitals/Pain Pain Assessment: No/denies pain    Home Living Family/patient expects to be discharged to:: Private residence Living Arrangements: Children Available Help at Discharge: Family;Available 24 hours/day Type of Home: Mobile home Home Access: Ramped entrance     Home Layout: One level Home Equipment: [place] - 2 wheels;Walker - 4 wheels;Bedside commode;Hospital bed      Prior Function Level of Independence: Needs assistance   Gait / Transfers Assistance Needed: ues Rw in home, WC in community, assist for transfers  ADL's / Homemaking Assistance Needed: daughter assists with bathing/dressing. Daughter does all  the homemaking        Hand Dominance        Extremity/Trunk Assessment   Upper Extremity Assessment Upper Extremity Assessment: Defer to OT evaluation    Lower Extremity Assessment Lower Extremity Assessment: Generalized weakness    Cervical / Trunk Assessment Cervical  / Trunk Assessment: Kyphotic  Communication   Communication: No difficulties  Cognition Arousal/Alertness: Awake/alert Behavior During Therapy: WFL for tasks assessed/performed Overall Cognitive Status: Within Functional Limits for tasks assessed                                        General Comments      Exercises     Assessment/Plan    PT Assessment Patient needs continued PT services  PT Problem List Decreased strength;Decreased mobility;Decreased activity tolerance;Decreased balance;Decreased knowledge of use of DME;Cardiopulmonary status limiting activity       PT Treatment Interventions DME instruction;Therapeutic exercise;Gait training;Balance training;Functional mobility training;Therapeutic activities;Patient/family education    PT Goals (Current goals can be found in the Care Plan section)  Acute Rehab PT Goals Patient Stated Goal: be able to return home stronger PT Goal Formulation: With patient Time For Goal Achievement: 05/22/20 Potential to Achieve Goals: Fair    Frequency Min 2X/week   Barriers to discharge Decreased caregiver support      Co-evaluation PT/OT/SLP Co-Evaluation/Treatment: Yes Reason for Co-Treatment: Complexity of the patient's impairments (multi-system involvement);For patient/therapist safety PT goals addressed during session: Mobility/safety with mobility         AM-PAC PT "6 Clicks" Mobility  Outcome Measure Help needed turning from your back to your side while in a flat bed without using bedrails?: A Little Help needed moving from lying on your back to sitting on the side of a flat bed without using bedrails?: A Little Help needed moving to and from a bed to a chair (including a wheelchair)?: A Little Help needed standing up from a chair using your arms (e.g., wheelchair or bedside chair)?: A Little Help needed to walk in hospital room?: A Little Help needed climbing 3-5 steps with a railing? : A Lot 6 Click  Score: 17    End of Session Equipment Utilized During Treatment: Gait belt;Oxygen Activity Tolerance: Patient tolerated treatment well Patient left: in bed;with bed alarm set Nurse Communication: Mobility status PT Visit Diagnosis: Other abnormalities of gait and mobility (R26.89);Difficulty in walking, not elsewhere classified (R26.2);Muscle weakness (generalized) (M62.81)    Time: 2376-2831 PT Time Calculation (min) (ACUTE ONLY): 35 min   Charges:   PT Evaluation $PT Eval Moderate Complexity: 1 Mod          [place], PT Acute Rehabilitation Services Pager: [phone] Office: [phone]   Lasaundra Riche B Aanya Haynes 05/08/2020, 10:07 AM

## 2020-05-08 NOTE — Progress Notes (Signed)
This chaplain responded to PMT consult for spiritual care and completion of HCPOA.  The chaplain understands education is complete and HCPOA is ready to notarize.  The chaplain is present with the Pt., notary, and two witnesses for the notarizing of the Pt. HCPOA.  The Pt. named her son Evangelina Delancey as her healthcare agent. The Pt. did not complete a Living Will.  The chaplain gave the Pt. the original Advance Directive and one copy.  The chaplain scanned a AD copy to the Pt. EMR.  F/U spiritual care is available as needed.

## 2020-05-08 NOTE — Progress Notes (Signed)
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 neurology recommended aspirin-also found to have streptococcal bacteremia to complete continuous infusion penicillin 04/13/2020 to right IJ tunneled CVC catheter placed at that time  Presented with inability to ambulate weakness slurring of speech and tremor worse than normal on 05/05/2020 found to have T-max 102 leukocytosis tachycardia received 3 L normal saline Eventually found to have septic shock Klebsiella staph epidermidis bacteremia-also complicated by stridor upper airway irritation-is on ceftriaxone-she had a possible embolic renal infarct and was placed on heparin infusion but now has a large left upper extremity hematoma at the IV site and heparin was weaned Palliative care was consulted  Assessment & Plan:   Active Problems:   Sepsis ([place])   1. Sepsis secondary to Klebsiella staff epidermidis bacteremia probably from initial UTI a. Narrowed to ceftriaxone monotherapy 12/18 b. It is sensitive to Bactrim so we will start Bactrim DS and complete therapy after 10 days on 12/26 2. Renal infarct complicated by upper extremity hematoma a. Patient was given IV heparin which has now been discontinued b. Kidney function slightly elevated so continue fluids c. Expect will resolve but we will not use heparin going forward 3. Metastatic breast cancer with mastectomy on anastrozole 4. Symptomatic anemia a. Likely secondary to hematoma as above b. Transfused  this admission c. Recheck labs in a.m. 5. BMI 45 OSA not on CPAP 6. Upper airway irritation syndrome with stridor a. Tolerating diet and using minimal oxygen stable for transfer to telemetry b. Needs outpatient follow-up for sleep apnea 7. Hypothyroid a. Continue Synthroid 25 mcg daily b. TSH in several weeks 8. HTN a. Not on any meds currently 9. Acute renal failure due to shock a. Peak creatinine 14/1.3 and is improved b. We will continue IV fluids c. If no improvement bladder scan  DVT prophylaxis: Heparin Code Status: DNR confirmed Family Communication: called daugther, gave quick update, called son [hcpoa] no answer left VM Disposition:  Status is: Inpatient  Remains inpatient appropriate because:Persistent severe electrolyte disturbances and Ongoing active pain requiring inpatient pain management   Dispo: The patient is from: Home              Anticipated d/c is to: SNF              Anticipated d/c date is: 2 days              Patient currently is not medically stable to d/c.       Consultants:   none  Procedures: noen  Antimicrobials: Ceftriaxone    Subjective: Well Some sob opverall better she thinks Has been oob a bit No fever , diarr cp  Some residual sob  Objective: Vitals:   05/08/20 0400 05/08/20 0414 05/08/20 0500 05/08/20 0600  BP: (!) 130/57  (!) 132/45 (!) 142/48  Pulse: 87  82 85  Resp: (!) 30  (!) 27   Temp:    97.6 F (36.4 C)  TempSrc:    Oral  SpO2: 100%  100% 100%  Weight:  101.8 kg    Height:  Intake/Output Summary (Last 24 hours) at 05/08/2020 0730 Last data filed at 05/08/2020 0600 Gross per 24 hour  Intake 792.98 ml  Output 850 ml  Net -57.02 ml   Filed Weights   05/06/20 1015 05/07/20 0359 05/08/20 0414  Weight: 98.3 kg 99.4 kg 101.8 kg    Examination:  eomi ncat no focal deficit cta b no added sound no rale sno rhonchi abd soft nt nd no rebound no guard Some trace le edema Neuro intact-power 5/5 but  weak Sensory intact    Data Reviewed: I have personally reviewed following labs and imaging studies Sodium 131 potassium 4.1 BUN/creatinine 23/0.6 Hemoglobin 6.6-->8.32 units WBC 7.3 Platelet down from 98->55  Radiology Studies: DG Chest Port 1 View  Result Date: 05/06/2020 CLINICAL DATA:  Sepsis, respiratory distress and history breast carcinoma. EXAM: PORTABLE CHEST 1 VIEW COMPARISON:  05/05/2020 FINDINGS: Stable top-normal heart size. Lungs show slightly improved aeration since the prior chest x-ray with some residual patchy areas airspace disease bilaterally. Pneumonia would be difficult to exclude. There may be a component of mild interstitial pulmonary edema. No pleural effusions or pneumothorax. IMPRESSION: Slightly improved aeration since the prior chest x-ray with some residual patchy airspace disease bilaterally. Pneumonia would be difficult to exclude. There may be a component of mild interstitial pulmonary edema. Electronically Signed   By: Aletta Edouard M.D.   On: 05/06/2020 09:17   ECHOCARDIOGRAM COMPLETE  Result Date: 05/06/2020    ECHOCARDIOGRAM REPORT   Patient Name:   Jasmine Hood Date of Exam: 05/06/2020 Medical Rec #:  [id number]      Height:       59.0 in Accession #:    [id number]     Weight:       216.7 lb Date of Birth:  1945/11/04      BSA:          1.908 m Patient Age:    44 years       BP:           119/55 mmHg Patient Gender: F              HR:           120 bpm. Exam Location:  Inpatient Procedure: 2D Echo Indications:    Elevated Troponin  History:        Patient has prior history of Echocardiogram examinations, most                 recent 03/10/2020. Stroke; Risk Factors:Non-Smoker and                 Hypertension. Covid 19, Breast Cancer, Double Mastectomy.  Sonographer:    Leavy Cella Referring Phys: [id number] [place]  1. Left ventricular ejection fraction, by estimation, is 60 to 65%. The left ventricle has normal function. The left  ventricle has no regional wall motion abnormalities. There is mild concentric left ventricular hypertrophy. Indeterminate diastolic filling due to E-A fusion.  2. Right ventricular systolic function is normal. The right ventricular size is normal.  3. The mitral valve is normal in structure. Mild mitral valve regurgitation.  4. The aortic valve is tricuspid. There is mild calcification of the aortic valve. There is mild thickening of the aortic valve. Aortic valve regurgitation is mild to moderate. Mild aortic valve sclerosis is present, with no evidence of aortic valve stenosis.  5. The inferior vena cava is normal in size with greater than 50% respiratory variability, suggesting right atrial pressure  of 3 mmHg. Comparison(s): Compared to prior TTE on 03/10/20, there is no significant change. FINDINGS  Left Ventricle: Left ventricular ejection fraction, by estimation, is 60 to 65%. The left ventricle has normal function. The left ventricle has no regional wall motion abnormalities. The left ventricular internal cavity size was normal in size. There is  mild concentric left ventricular hypertrophy. Indeterminate diastolic filling due to E-A fusion. Right Ventricle: The right ventricular size is normal. No increase in right ventricular wall thickness. Right ventricular systolic function is normal. Left Atrium: Left atrial size was normal in size. Right Atrium: Right atrial size was normal in size. Pericardium: There is no evidence of pericardial effusion. Mitral Valve: The mitral valve is normal in structure. There is mild thickening of the mitral valve leaflet(s). Mild mitral annular calcification. Mild mitral valve regurgitation. Tricuspid Valve: The tricuspid valve is normal in structure. Tricuspid valve regurgitation is mild. Aortic Valve: The aortic valve is tricuspid. There is mild calcification of the aortic valve. There is mild thickening of the aortic valve. Aortic valve regurgitation is mild to moderate.  Mild aortic valve sclerosis is present, with no evidence of aortic  valve stenosis. Pulmonic Valve: The pulmonic valve was normal in structure. Pulmonic valve regurgitation is not visualized. Aorta: The aortic root is normal in size and structure. Venous: The inferior vena cava is normal in size with greater than 50% respiratory variability, suggesting right atrial pressure of 3 mmHg. IAS/Shunts: No atrial level shunt detected by color flow Doppler.  LEFT VENTRICLE PLAX 2D LVIDd:         3.30 cm  Diastology LVIDs:         2.40 cm  LV e' medial:    13.40 cm/s LV PW:         1.20 cm  LV E/e' medial:  9.7 LV IVS:        1.30 cm  LV e' lateral:   12.50 cm/s LVOT diam:     1.70 cm  LV E/e' lateral: 10.4 LVOT Area:     2.27 cm  RIGHT VENTRICLE TAPSE (M-mode): 1.5 cm LEFT ATRIUM           Index       RIGHT ATRIUM           Index LA diam:      3.50 cm 1.83 cm/m  RA Area:     11.00 cm LA Vol (A2C): 24.9 ml 13.05 ml/m RA Volume:   25.50 ml  13.36 ml/m LA Vol (A4C): 26.9 ml 14.10 ml/m   AORTA Ao Root diam: 2.30 cm MITRAL VALVE                TRICUSPID VALVE MV Area (PHT): 4.96 cm     TR Peak grad:   25.8 mmHg MV Decel Time: 153 msec     TR Vmax:        254.00 cm/s MV E velocity: 130.00 cm/s                             SHUNTS                             Systemic Diam: 1.70 cm Gwyndolyn Kaufman MD Electronically signed by Gwyndolyn Kaufman MD Signature Date/Time: 05/06/2020/2:41:44 PM    Final    VAS [place] UPPER EXTREMITY VENOUS DUPLEX  Result Date: 05/07/2020 UPPER VENOUS STUDY  Indications:  Swelling Limitations: Body habitus, poor ultrasound/tissue interface, bandages, line and patient positioning, patient pain tolerance. Comparison Study: No prior studies. Performing Technologist: Oliver Hum RVT  Examination Guidelines: A complete evaluation includes B-mode imaging, spectral Doppler, color Doppler, and power Doppler as needed of all accessible portions of each vessel. Bilateral testing is considered an integral  part of a complete examination. Limited examinations for reoccurring indications may be performed as noted.  Right Findings: +----------+------------+---------+-----------+----------+-------+ RIGHT     CompressiblePhasicitySpontaneousPropertiesSummary +----------+------------+---------+-----------+----------+-------+ Subclavian    Full       Yes       Yes                      +----------+------------+---------+-----------+----------+-------+  Left Findings: +----------+------------+---------+-----------+----------+-------+ LEFT      CompressiblePhasicitySpontaneousPropertiesSummary +----------+------------+---------+-----------+----------+-------+ IJV           Full       Yes       Yes                      +----------+------------+---------+-----------+----------+-------+ Subclavian    Full       Yes       Yes                      +----------+------------+---------+-----------+----------+-------+ Axillary      Full       Yes       Yes                      +----------+------------+---------+-----------+----------+-------+ Brachial      Full       Yes       Yes                      +----------+------------+---------+-----------+----------+-------+ Radial        Full                                          +----------+------------+---------+-----------+----------+-------+ Ulnar         Full                                          +----------+------------+---------+-----------+----------+-------+ Cephalic      Full                                          +----------+------------+---------+-----------+----------+-------+ Basilic       Full                                          +----------+------------+---------+-----------+----------+-------+  Summary:  Right: No evidence of thrombosis in the subclavian.  Left: No evidence of deep vein thrombosis in the upper extremity. No evidence of superficial vein thrombosis in the upper extremity.   *See table(s) above for measurements and observations.  Diagnosing physician: Monica Martinez MD Electronically signed by Monica Martinez MD on 05/07/2020 at 3:41:41 PM.    Final      Scheduled Meds: . acetaminophen  650 mg Oral TID  . anastrozole  1 mg Oral Daily  .  Chlorhexidine Gluconate Cloth  6 each Topical Q0600  . citalopram  10 mg Oral Daily  . heparin injection (subcutaneous)  5,000 Units Subcutaneous Q8H  . insulin aspart  1-3 Units Subcutaneous Q4H  . levothyroxine  25 mcg Oral Q0600  . lidocaine  1 patch Transdermal Q24H  . pantoprazole  40 mg Oral Daily   Continuous Infusions: . sodium chloride 10 mL/hr at 05/08/20 0600  . cefTRIAXone (ROCEPHIN)  IV Stopped (05/07/20 1751)     LOS: 3 days    Time spent: [place], MD [facility] To contact the attending provider between 7A-7P or the covering provider during after hours 7P-7A, please log into the web site [email or web address] and access using universal [place] password for that web site. If you do not have the password, please call the hospital operator.  05/08/2020, 7:30 AM

## 2020-05-08 NOTE — Telephone Encounter (Signed)
fyi

## 2020-05-08 NOTE — Evaluation (Signed)
Occupational Therapy Evaluation Patient Details Name: Jasmine Hood MRN: [id number] DOB: Oct 18, 1945 Today's Date: 05/08/2020    History of Present Illness 74 y/o woman with a history of metastatic breast cancer to her spine admitted 12/17 after a fall. Pt with Klebsiella and staph epi bacteremia with suspected right renal infarct. PMhx: left cerebellar infarct 10/21, hypothryroidism, HTN, covid 07 Mar 2020   Clinical Impression   Pt PTA: Pt living with daughter and pt required assist with ADL due to pain and poor activity tolerance. Pt mobilizing with RW at home. Pt's daughter performing all iADL at home. Pt Pt limited by decreased strength, decreased ability to care for self, decreased endurance for all functional tasks. SpO2 on RA 86%; 3L O2 with activity  >90%. Pt would benefit from continued OT skilled services for ADL, mobility and safety in SNF setting. OT following acutely       Follow Up Recommendations  SNF;Supervision/Assistance - 24 hour    Equipment Recommendations  3 in 1 bedside commode    Recommendations for Other Services       Precautions / Restrictions Precautions Precautions: Fall Precaution Comments: watch sats Restrictions Weight Bearing Restrictions: No      Mobility Bed Mobility Overal bed mobility: Needs Assistance Bed Mobility: Supine to Sit     Supine to sit: Min assist;HOB elevated     General bed mobility comments: HOB 25 degrees with cues to pivot toward left side of bed with use of rail, assist to elevate trunk, increased time and mod assist to fully scoot to EOB    Transfers Overall transfer level: Needs assistance Equipment used: Rolling walker (2 wheeled) Transfers: Sit to/from [facility] Sit to Stand: Min guard Stand pivot transfers: Min guard       General transfer comment: Cues for hand placement; pt x4 sit to stands; pt declined staying in recliner s/p education for benefits.    Balance Overall balance  assessment: Needs assistance Sitting-balance support: Feet supported Sitting balance-Leahy Scale: Poor Sitting balance - Comments: pt able to statically sit minguard but with movement requires assist   Standing balance support: Bilateral upper extremity supported Standing balance-Leahy Scale: Poor Standing balance comment: RW with bil UE support in standing                           ADL either performed or assessed with clinical judgement   ADL Overall ADL's : Needs assistance/impaired Eating/Feeding: Set up;Sitting Eating/Feeding Details (indicate cue type and reason): "I only want my coke." Pt requires assist for opening can/items Grooming: Minimal assistance;Sitting   Upper Body Bathing: Minimal assistance;Sitting   Lower Body Bathing: Maximal assistance;Sitting/lateral leans;Sit to/from stand   Upper Body Dressing : Minimal assistance;Sitting   Lower Body Dressing: Maximal assistance;Sitting/lateral leans;Sit to/from stand   Toilet Transfer: Minimal assistance;+2 for physical assistance   Toileting- Clothing Manipulation and Hygiene: Maximal assistance;Sitting/lateral lean;Sit to/from stand;Cueing for safety       Functional mobility during ADLs: Minimal assistance;+2 for physical assistance;Rolling walker General ADL Comments: Pt limited by decreased strength, decreased ability to care for self, decreased endurance for all functional tasks.     Vision Baseline Vision/History: No visual deficits Patient Visual Report: No change from baseline Vision Assessment?: No apparent visual deficits     Perception     Praxis      Pertinent Vitals/Pain Pain Assessment: No/denies pain     Hand Dominance Right   Extremity/Trunk Assessment Upper Extremity Assessment  Upper Extremity Assessment: Overall WFL for tasks assessed   Lower Extremity Assessment Lower Extremity Assessment: Generalized weakness   Cervical / Trunk Assessment Cervical / Trunk Assessment:  Kyphotic   Communication Communication Communication: No difficulties   Cognition Arousal/Alertness: Awake/alert Behavior During Therapy: WFL for tasks assessed/performed Overall Cognitive Status: Within Functional Limits for tasks assessed                                     General Comments  SpO2 on RA 86%; 3L O2 with activity  >90%    Exercises     Shoulder Instructions      Home Living Family/patient expects to be discharged to:: Private residence Living Arrangements: Children Available Help at Discharge: Family;Available 24 hours/day Type of Home: Mobile home Home Access: Ramped entrance     Home Layout: One level     Bathroom Shower/Tub: Occupational psychologist: Standard     Home Equipment: Environmental consultant - 2 wheels;Walker - 4 wheels;Bedside commode;Hospital bed          Prior Functioning/Environment Level of Independence: Needs assistance  Gait / Transfers Assistance Needed: ues Rw in home, WC in community, assist for transfers ADL's / Homemaking Assistance Needed: daughter assists with bathing/dressing. Daughter does all the homemaking            OT Problem List: Decreased strength;Decreased activity tolerance;Impaired balance (sitting and/or standing);Decreased safety awareness;Increased edema;Impaired UE functional use;Cardiopulmonary status limiting activity      OT Treatment/Interventions: Self-care/ADL training;Therapeutic exercise;Energy conservation;DME and/or AE instruction;Therapeutic activities;Patient/family education;Balance training    OT Goals(Current goals can be found in the care plan section) Acute Rehab OT Goals Patient Stated Goal: be able to return home stronger OT Goal Formulation: With patient Time For Goal Achievement: 05/22/20 Potential to Achieve Goals: Good ADL Goals Pt Will Perform Grooming: with supervision;standing Pt Will Transfer to Toilet: with min guard assist;ambulating Pt Will Perform Toileting -  Clothing Manipulation and hygiene: with min guard assist;sit to/from stand Pt/caregiver will Perform Home Exercise Program: Increased strength;Both right and left upper extremity;With Supervision Additional ADL Goal #1: Pt will increase to x5 mins of standing for ADL task in order to increase activity tolerance.  OT Frequency: Min 2X/week   Barriers to D/C:            Co-evaluation PT/OT/SLP Co-Evaluation/Treatment: Yes Reason for Co-Treatment: Complexity of the patient's impairments (multi-system involvement) PT goals addressed during session: Mobility/safety with mobility OT goals addressed during session: ADL's and self-care      AM-PAC OT "6 Clicks" Daily Activity     Outcome Measure Help from another person eating meals?: A Little Help from another person taking care of personal grooming?: A Little Help from another person toileting, which includes using toliet, bedpan, or urinal?: A Lot Help from another person bathing (including washing, rinsing, drying)?: A Lot Help from another person to put on and taking off regular upper body clothing?: A Little Help from another person to put on and taking off regular lower body clothing?: A Lot 6 Click Score: 15   End of Session Equipment Utilized During Treatment: Gait belt;Rolling walker;Oxygen Nurse Communication: Mobility status  Activity Tolerance: Patient limited by fatigue Patient left: in bed;with call bell/phone within reach  OT Visit Diagnosis: Unsteadiness on feet (R26.81);Muscle weakness (generalized) (M62.81)                Time: 8916-9450 OT Time  Calculation (min): 32 min Charges:  OT General Charges $OT Visit: 1 Visit OT Evaluation $OT Eval Moderate Complexity: 1 Mod  Jefferey Pica, OTR/L Acute Rehabilitation Services Pager: [phone] Office: [phone]   Zina Pitzer C 05/08/2020, 11:10 AM

## 2020-05-08 NOTE — Telephone Encounter (Signed)
FYI:  Pts friend is calling in stating that the pt is in the hospital with a UTI and just wanted [place] to know that.  Friend is aware that the provider is out of the office but just wanted to leave a msg.

## 2020-05-09 ENCOUNTER — Telehealth: Payer: Self-pay | Admitting: Oncology

## 2020-05-09 LAB — GLUCOSE, CAPILLARY
Glucose-Capillary: 103 mg/dL — ABNORMAL HIGH (ref 70–99)
Glucose-Capillary: 120 mg/dL — ABNORMAL HIGH (ref 70–99)
Glucose-Capillary: 144 mg/dL — ABNORMAL HIGH (ref 70–99)
Glucose-Capillary: 155 mg/dL — ABNORMAL HIGH (ref 70–99)
Glucose-Capillary: 157 mg/dL — ABNORMAL HIGH (ref 70–99)
Glucose-Capillary: 164 mg/dL — ABNORMAL HIGH (ref 70–99)

## 2020-05-09 LAB — COMPREHENSIVE METABOLIC PANEL
ALT: 38 U/L (ref 0–44)
AST: 58 U/L — ABNORMAL HIGH (ref 15–41)
Albumin: 1.8 g/dL — ABNORMAL LOW (ref 3.5–5.0)
Alkaline Phosphatase: 133 U/L — ABNORMAL HIGH (ref 38–126)
Anion gap: 8 (ref 5–15)
BUN: 16 mg/dL (ref 8–23)
CO2: 22 mmol/L (ref 22–32)
Calcium: 7.2 mg/dL — ABNORMAL LOW (ref 8.9–10.3)
Chloride: 103 mmol/L (ref 98–111)
Creatinine, Ser: 0.56 mg/dL (ref 0.44–1.00)
GFR, Estimated: >60 mL/min (ref >60)
Glucose, Bld: 152 mg/dL — ABNORMAL HIGH (ref 70–99)
Potassium: 4.2 mmol/L (ref 3.5–5.1)
Sodium: 133 mmol/L — ABNORMAL LOW (ref 135–145)
Total Bilirubin: 0.9 mg/dL (ref 0.3–1.2)
Total Protein: 5.9 g/dL — ABNORMAL LOW (ref 6.5–8.1)

## 2020-05-09 LAB — CBC
HCT: 25.4 % — ABNORMAL LOW (ref 36.0–46.0)
Hemoglobin: 8.2 g/dL — ABNORMAL LOW (ref 12.0–15.0)
MCH: 30.7 pg (ref 26.0–34.0)
MCHC: 32.3 g/dL (ref 30.0–36.0)
MCV: 95.1 fL (ref 80.0–100.0)
Platelets: 60 10*3/uL — ABNORMAL LOW (ref 150–400)
RBC: 2.67 MIL/uL — ABNORMAL LOW (ref 3.87–5.11)
RDW: 17.9 % — ABNORMAL HIGH (ref 11.5–15.5)
WBC: 6.3 10*3/uL (ref 4.0–10.5)
nRBC: 0 % (ref 0.0–0.2)

## 2020-05-09 MED ORDER — SULFAMETHOXAZOLE-TRIMETHOPRIM 800-160 MG PO TABS
1.0000 | ORAL_TABLET | Freq: Two times a day (BID) | ORAL | Status: DC
  Administered 2020-05-09 – 2020-05-10 (×3): 1 via ORAL
  Filled 2020-05-09 (×3): qty 1

## 2020-05-09 MED ORDER — METOPROLOL TARTRATE 12.5 MG HALF TABLET
12.5000 mg | ORAL_TABLET | Freq: Two times a day (BID) | ORAL | Status: DC
  Administered 2020-05-09 – 2020-05-14 (×10): 12.5 mg via ORAL
  Filled 2020-05-09 (×10): qty 1

## 2020-05-09 NOTE — Progress Notes (Addendum)
Upon arrival to shift, this nurse found the patient with a Midline and a PIV on the bedside table. Patient removed both by herself and stated "I do not want anything else placed in my arm." Both sites were assessed and found to be bleeding. Gauze was placed on both sites. No other signs or symptoms of distress were noted by the patient. Orientation was also assessed and all questions were answered appropriately. Patient was then educated on the importance of having IV access in the case of intravenous medications or emergencies. Will continue to monitor closely. TR[facility] notified.    Antonieta Pert, RN  05/09/2020 1900

## 2020-05-09 NOTE — Telephone Encounter (Signed)
Rescheduled apt per 12/20 sch msg - pt to get an updated schedule in hospital with discharge

## 2020-05-09 NOTE — Progress Notes (Addendum)
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 neurology recommended aspirin-also found to have streptococcal bacteremia to complete continuous infusion penicillin 04/13/2020 to right IJ tunneled CVC catheter placed at that time  Presented with inability to ambulate weakness slurring of speech and tremor worse than normal on 05/05/2020 found to have T-max 102 leukocytosis tachycardia received 3 L normal saline Eventually found to have septic shock Klebsiella staph epidermidis bacteremia-also complicated by stridor upper airway irritation-is on ceftriaxone-she had a possible embolic renal infarct and was placed on heparin infusion but now has a large left upper extremity hematoma at the IV site and heparin was weaned Palliative care was consulted  Assessment & Plan:   Active Problems:   Sepsis ([place])   1. Sepsis secondary to Klebsiella staff epidermidis bacteremia probably from initial UTI a. Narrowed to ceftriaxone monotherapy 12/18 b. It is sensitive to Bactrim so we will start Bactrim DS and complete therapy after 10 days on 12/26 c. Likely will need SNF in the next 24-48H 2. Renal infarct complicated by upper extremity hematoma a. Patient was given IV heparin which has now been discontinued b. Saline lock today-monitor trends c. Expect will resolve but we will not use heparin going forward 3. Metastatic breast cancer with mastectomy on anastrozole 4. Symptomatic anemia a. Likely secondary to hematoma as  above b. S/p Transufsions this admit c. Recheck labs in a.m. 5. Thrombocytopenia query HIT a. We will send off HIT panel and heparin has been held b. Suspect this may also be secondary to anastrozole therapy could cause leukopenia 5% of cases as per up-to-date c. We will monitor trends over the next 24 hours 6. BMI 45 OSA not on CPAP 7. Upper airway irritation syndrome with stridor a. Tolerating diet and using minimal oxygen stable for transfer to telemetry b. Needs outpatient follow-up for sleep apnea 8. Hypothyroid a. Continue Synthroid 25 mcg daily b. TSH in several weeks 9. HTN a. Not on any meds currently 10. Acute renal failure due to shock a. Peak creatinine 14/1.3 and is improved b. We will continue IV fluids c. If no improvement bladder scan  DVT prophylaxis: Heparin Code Status: DNR confirmed Family Communication: On 12/20 did update son [hcpoa]  Disposition:  Status is: Inpatient  Remains inpatient appropriate because:Persistent severe electrolyte disturbances and Ongoing active pain requiring inpatient pain management   Dispo: The patient is from: Home              Anticipated d/c is to: SNF              Anticipated d/c date is: 2 days              Patient currently is not medically stable to d/c.       Consultants:   none  Procedures: noen  Antimicrobials: Ceftriaxone    Subjective:  Awake somewhat sleepy however no No distress no  fever.  No chills  Hematoma in left arm seems better  Objective: Vitals:   05/09/20 0600 05/09/20 0700 05/09/20 0841 05/09/20 1200  BP: (!) 144/48 (!) 196/164 (!) 159/69 120/63  Pulse:  84 89 99 95  Resp: (!) 25 (!) 35 (!) 29 (!) 24  Temp:  97.9 F (36.6 C)    TempSrc:  Oral    SpO2: 100% 99% 96% 98%  Weight:      Height:        Intake/Output Summary (Last 24 hours) at 05/09/2020 1338 Last data filed at 05/09/2020 1110 Gross per 24 hour  Intake 514.85 ml  Output 1051 ml  Net -536.15 ml   Filed Weights    05/06/20 1015 05/07/20 0359 05/08/20 0414  Weight: 98.3 kg 99.4 kg 101.8 kg    Examination:  Upper extremity hematoma on the left side seems to be fading No tenderness Chest clear no added sound Abdomen soft no rebound no guarding Trace lower extremity edema Chest is clear   Data Reviewed: I have personally reviewed following labs and imaging studies Sodium 131--133 Potassium 4.1--4.2 BUN/creatinine 23/0.6--16/0.5 Hemoglobin 6.6-->8.2 WBC 7.3 Platelet down from 98->55---> 60  Radiology Studies: No results found.   Scheduled Meds: . acetaminophen  650 mg Oral TID  . anastrozole  1 mg Oral Daily  . Chlorhexidine Gluconate Cloth  6 each Topical Q0600  . citalopram  10 mg Oral Daily  . heparin injection (subcutaneous)  5,000 Units Subcutaneous Q8H  . insulin aspart  1-3 Units Subcutaneous Q4H  . levothyroxine  25 mcg Oral Q0600  . lidocaine  1 patch Transdermal Q24H  . pantoprazole  40 mg Oral Daily   Continuous Infusions: . sodium chloride 10 mL/hr at 05/08/20 2000  . cefTRIAXone (ROCEPHIN)  IV Stopped (05/08/20 1639)     LOS: 4 days    Time spent: 69  Nita Sells, MD [facility] To contact the attending provider between 7A-7P or the covering provider during after hours 7P-7A, please log into the web site [email or web address] and access using universal [place] password for that web site. If you do not have the password, please call the hospital operator.  05/09/2020, 1:38 PM

## 2020-05-09 NOTE — Progress Notes (Signed)
Patient had 9 runs of VT at 1505, asymptomatic, no complaint of SOB or discomfort. MD. Verlon Au was notify. patient is back to NSR HR 86, BP 143/64. Will continue to monitor.

## 2020-05-09 NOTE — Progress Notes (Signed)
This chaplain is present for F/U spiritual care with the Pt.  The Pt. is alert and attentive during the visit.   The chaplain listens to the Pt. and hears the reoccurring theme of wanting to go home.  Going home is connected to the Pt. desire for rehab and returning to the peaceful environment she calls home.  The Pt. is open to F/U spiritual care through the rehab journey.

## 2020-05-10 DIAGNOSIS — A4159 Other Gram-negative sepsis: Principal | ICD-10-CM

## 2020-05-10 DIAGNOSIS — N280 Ischemia and infarction of kidney: Secondary | ICD-10-CM | POA: Diagnosis present

## 2020-05-10 DIAGNOSIS — R5381 Other malaise: Secondary | ICD-10-CM

## 2020-05-10 DIAGNOSIS — N390 Urinary tract infection, site not specified: Secondary | ICD-10-CM

## 2020-05-10 LAB — GLUCOSE, CAPILLARY
Glucose-Capillary: 116 mg/dL — ABNORMAL HIGH (ref 70–99)
Glucose-Capillary: 156 mg/dL — ABNORMAL HIGH (ref 70–99)
Glucose-Capillary: 166 mg/dL — ABNORMAL HIGH (ref 70–99)
Glucose-Capillary: 182 mg/dL — ABNORMAL HIGH (ref 70–99)
Glucose-Capillary: 200 mg/dL — ABNORMAL HIGH (ref 70–99)
Glucose-Capillary: 96 mg/dL (ref 70–99)
Glucose-Capillary: 99 mg/dL (ref 70–99)

## 2020-05-10 LAB — HEPARIN INDUCED PLATELET AB (HIT ANTIBODY): Heparin Induced Plt Ab: 0.526 OD — ABNORMAL HIGH (ref 0.000–0.400)

## 2020-05-10 MED ORDER — SULFAMETHOXAZOLE-TRIMETHOPRIM 800-160 MG PO TABS
2.0000 | ORAL_TABLET | Freq: Two times a day (BID) | ORAL | Status: DC
  Administered 2020-05-10 – 2020-05-14 (×8): 2 via ORAL
  Filled 2020-05-10 (×10): qty 2

## 2020-05-10 NOTE — Progress Notes (Signed)
PROGRESS NOTE  Jasmine Hood UKG:[id number] DOB: [birth year] DOA: 05/05/2020 PCP: [name], [name], MD  HPI/Recap of past 24 hours: Patient is a 74 year old female with past medical history of metastatic breast cancer status postmastectomy on anastrozole who had a recent complicated stay in October with bacteremia and septic emboli to the brain causing cerebellar infarction, Also at that time, found to have streptococcal bacteremia and completed a continuous penicillin infusion until 11/25, who presented to the emergency room on 12/17 with slurred speech and weakness and found to have sepsis felt to be secondary to a urinary tract infection.  Patient was treated with antibiotics and cultures grew out Klebsiella and possibly staph epidermis.  Course was complicated by stridor and a renal infarction requiring heparin which led to a right upper extremity hematoma.  Palliative care consulted.  Patient seen by physical and Occupational Therapy who are recommending skilled nursing.  She herself is feeling better.  Assessment/Plan: Active Problems:   Hypothyroidism: Continue Synthroid.  Follow-up TSH as outpatient    MORBID OBESITY: Meets criteria with BMI greater than 40.    Essential hypertension: Currently not on any medications, blood pressure stable.    OSA (obstructive sleep apnea): Not on CPAP.    Transaminitis: Chronic, stable   AKI (acute kidney injury) ([place]): Secondary to severe sepsis.  Creatinine now normalized.   Acute lower UTI Severe Klebsiella sepsis present on admission [facility]) which developed into septic shock within 24 hours: Secondary to acute UTI.  Patient met criteria for severe sepsis and then septic shock given urinary infection plus lactic acidosis.  Treated aggressively with fluids and antibiotics.   Renal infarct [facility]): Was on IV heparin which was discontinued after developed hematoma.   Code Status: DNR  Family Communication: Left message for  daughter  Disposition Plan: We will need short-term skilled nursing   Consultants:  Palliative care  CCM   Procedures:  Preserved ejection fraction, no evidence of diastolic dysfunction, no significant valvular dysfunction  Antimicrobials:  IV vancomycin and Flagyl 12/17 only  IV cefepime 12/17-12/18  IV Rocephin 12/18-present  DVT prophylaxis: SCDs, previously on heparin   Objective: Vitals:   05/10/20 0923 05/10/20 1200  BP: (!) 139/43 (!) 132/50  Pulse: 95 84  Resp:  20  Temp:    SpO2:  99%    Intake/Output Summary (Last 24 hours) at 05/10/2020 1517 Last data filed at 05/10/2020 1319 Gross per 24 hour  Intake 240 ml  Output 302 ml  Net -62 ml   Filed Weights   05/06/20 1015 05/07/20 0359 05/08/20 0414  Weight: 98.3 kg 99.4 kg 101.8 kg   Body mass index is 45.33 kg/m.  Exam:   General: Alert and oriented x3, no acute distress  HEENT: Normocephalic and atraumatic mucous membranes slightly dry  Cardiovascular: Regular rate and rhythm, S1-S2  Respiratory: Clear to auscultation bilaterally  Abdomen: Soft, obese, nontender, positive bowel sounds  Musculoskeletal: No clubbing or cyanosis, trace pitting edema  Skin: No skin breaks, tears or lesions  Psychiatry: Appropriate, no evidence of psychoses   Data Reviewed: CBC: Recent Labs  Lab 05/05/20 1436 05/05/20 2338 05/06/20 0310 05/07/20 0327 05/07/20 1112 05/07/20 1959 05/08/20 0159 05/09/20 0156  WBC 15.8*  --  12.2* 8.0  --   --  7.3 6.3  NEUTROABS 14.9*  --   --   --   --   --   --   --   HGB 9.3*   < > 8.8* 7.4* 6.6* 8.3*  8.4* 8.2*  HCT 30.5*   < > 27.8* 23.3* 21.1* 26.0* 26.8* 25.4*  MCV 103.0*  --  102.2* 99.1  --   --  95.4 95.1  PLT 130*  --  98* 68*  --   --  55* 60*   < > = values in this interval not displayed.   Basic Metabolic Panel: Recent Labs  Lab 05/05/20 1920 05/05/20 2338 05/06/20 0310 05/06/20 0918 05/07/20 0327 05/08/20 0159 05/09/20 0156  NA 136 137  134*  --  128* 131* 133*  K 3.5 4.0 4.0  --  4.0 4.1 4.2  CL 109  --  105  --  101 103 103  CO2 14*  --  16*  --  18* 20* 22  GLUCOSE 178*  --  162*  --  156* 120* 152*  BUN 13  --  15  --  20 23 16   CREATININE 0.84  --  0.88  --  0.69 0.63 0.56  CALCIUM 6.1*  --  7.8*  --  7.0* 7.2* 7.2*  MG  --   --   --  1.2* 2.1  --   --   PHOS  --   --   --  2.2* 4.2  --   --    GFR: Estimated Creatinine Clearance: 62.9 mL/min (by C-G formula based on SCr of 0.56 mg/dL). Liver Function Tests: Recent Labs  Lab 05/05/20 1436 05/09/20 0156  AST 109* 58*  ALT 37 38  ALKPHOS 110 133*  BILITOT 1.2 0.9  PROT 6.8 5.9*  ALBUMIN 2.1* 1.8*   No results for input(s): LIPASE, AMYLASE in the last 168 hours. No results for input(s): AMMONIA in the last 168 hours. Coagulation Profile: Recent Labs  Lab 05/05/20 1525  INR 1.4*   Cardiac Enzymes: Recent Labs  Lab 05/05/20 1436  CKTOTAL 45   BNP (last 3 results) No results for input(s): PROBNP in the last 8760 hours. HbA1C: No results for input(s): HGBA1C in the last 72 hours. CBG: Recent Labs  Lab 05/09/20 2333 05/10/20 0327 05/10/20 0901 05/10/20 1059 05/10/20 1125  GLUCAP 120* 116* 156* 200* 182*   Lipid Profile: No results for input(s): CHOL, HDL, LDLCALC, TRIG, CHOLHDL, LDLDIRECT in the last 72 hours. Thyroid Function Tests: No results for input(s): TSH, T4TOTAL, FREET4, T3FREE, THYROIDAB in the last 72 hours. Anemia Panel: No results for input(s): VITAMINB12, FOLATE, FERRITIN, TIBC, IRON, RETICCTPCT in the last 72 hours. Urine analysis:    Component Value Date/Time   COLORURINE YELLOW 05/05/2020 2305   APPEARANCEUR CLOUDY (A) 05/05/2020 2305   LABSPEC 1.045 (H) 05/05/2020 2305   PHURINE 5.0 05/05/2020 2305   GLUCOSEU NEGATIVE 05/05/2020 2305   GLUCOSEU NEGATIVE 01/25/2010 0858   HGBUR SMALL (A) 05/05/2020 2305   HGBUR 1+ 12/30/2008 0838   BILIRUBINUR NEGATIVE 05/05/2020 2305   BILIRUBINUR Neg 04/28/2015 1122   KETONESUR  NEGATIVE 05/05/2020 2305   PROTEINUR NEGATIVE 05/05/2020 2305   UROBILINOGEN 0.2 04/28/2015 1122   UROBILINOGEN 0.2 01/25/2010 0858   NITRITE NEGATIVE 05/05/2020 2305   LEUKOCYTESUR LARGE (A) 05/05/2020 2305   Sepsis Labs: @LABRCNTIP (procalcitonin:4,lacticidven:4)  ) Recent Results (from the past 240 hour(s))  Blood culture (routine single)     Status: Abnormal   Collection Time: 05/05/20  3:43 PM   Specimen: BLOOD  Result Value Ref Range Status   Specimen Description BLOOD LEFT ANTECUBITAL  Final   Special Requests   Final    BOTTLES DRAWN AEROBIC AND ANAEROBIC Blood Culture adequate  volume   Culture  Setup Time   Final    IN BOTH AEROBIC AND ANAEROBIC BOTTLES GRAM NEGATIVE RODS Organism ID to follow GRAM POSITIVE COCCI CRITICAL RESULT CALLED TO, READ BACK BY AND VERIFIED WITH: J. MILLEN PHARMD, AT 4403 05/06/20 BY D. VANHOOK    Culture (A)  Final    KLEBSIELLA PNEUMONIAE STAPHYLOCOCCUS EPIDERMIDIS THE SIGNIFICANCE OF ISOLATING THIS ORGANISM FROM A SINGLE SET OF BLOOD CULTURES WHEN MULTIPLE SETS ARE DRAWN IS UNCERTAIN. PLEASE NOTIFY THE MICROBIOLOGY DEPARTMENT WITHIN ONE WEEK IF SPECIATION AND SENSITIVITIES ARE REQUIRED. Performed at [facility], [place] [address], [place], [place] [address]    Report Status 05/08/2020 FINAL  Final   Organism ID, Bacteria KLEBSIELLA PNEUMONIAE  Final      Susceptibility   Klebsiella pneumoniae - MIC*    AMPICILLIN >=32 RESISTANT Resistant     CEFAZOLIN 8 SENSITIVE Sensitive     CEFEPIME <=0.12 SENSITIVE Sensitive     CEFTAZIDIME <=1 SENSITIVE Sensitive     CEFTRIAXONE <=0.25 SENSITIVE Sensitive     CIPROFLOXACIN <=0.25 SENSITIVE Sensitive     GENTAMICIN <=1 SENSITIVE Sensitive     IMIPENEM <=0.25 SENSITIVE Sensitive     TRIMETH/SULFA <=20 SENSITIVE Sensitive     AMPICILLIN/SULBACTAM >=32 RESISTANT Resistant     PIP/TAZO >=128 RESISTANT Resistant     Extended ESBL NOT DONE      * KLEBSIELLA PNEUMONIAE  Blood Culture ID Panel  (Reflexed)     Status: Abnormal   Collection Time: 05/05/20  3:43 PM  Result Value Ref Range Status   Enterococcus faecalis NOT DETECTED NOT DETECTED Final   Enterococcus Faecium NOT DETECTED NOT DETECTED Final   Listeria monocytogenes NOT DETECTED NOT DETECTED Final   Staphylococcus species DETECTED (A) NOT DETECTED Final    Comment: CRITICAL RESULT CALLED TO, READ BACK BY AND VERIFIED WITH: J. MILLEN PHARMD, AT 9563 05/06/20 BY D. VANHOOK    Staphylococcus aureus (BCID) NOT DETECTED NOT DETECTED Final   Staphylococcus epidermidis DETECTED (A) NOT DETECTED Final    Comment: CRITICAL RESULT CALLED TO, READ BACK BY AND VERIFIED WITH: J. MILLEN PHARMD, AT 8756 05/06/20 BY D. VANHOOK    Staphylococcus lugdunensis NOT DETECTED NOT DETECTED Final   Streptococcus species NOT DETECTED NOT DETECTED Final   Streptococcus agalactiae NOT DETECTED NOT DETECTED Final   Streptococcus pneumoniae NOT DETECTED NOT DETECTED Final   Streptococcus pyogenes NOT DETECTED NOT DETECTED Final   A.calcoaceticus-baumannii NOT DETECTED NOT DETECTED Final   Bacteroides fragilis NOT DETECTED NOT DETECTED Final   Enterobacterales DETECTED (A) NOT DETECTED Final    Comment: Enterobacterales represent a large order of gram negative bacteria, not a single organism. CRITICAL RESULT CALLED TO, READ BACK BY AND VERIFIED WITH: J. MILLEN PHARMD, AT 4332 05/06/20 BY D. VANHOOK    Enterobacter cloacae complex NOT DETECTED NOT DETECTED Final   Escherichia coli NOT DETECTED NOT DETECTED Final   Klebsiella aerogenes NOT DETECTED NOT DETECTED Final   Klebsiella oxytoca NOT DETECTED NOT DETECTED Final   Klebsiella pneumoniae DETECTED (A) NOT DETECTED Final    Comment: CRITICAL RESULT CALLED TO, READ BACK BY AND VERIFIED WITH: J. MILLEN PHARMD, AT 9518 05/06/20 BY D. VANHOOK    Proteus species NOT DETECTED NOT DETECTED Final   Salmonella species NOT DETECTED NOT DETECTED Final   Serratia marcescens NOT DETECTED NOT DETECTED  Final   Haemophilus influenzae NOT DETECTED NOT DETECTED Final   Neisseria meningitidis NOT DETECTED NOT DETECTED Final   Pseudomonas aeruginosa NOT  DETECTED NOT DETECTED Final   Stenotrophomonas maltophilia NOT DETECTED NOT DETECTED Final   Candida albicans NOT DETECTED NOT DETECTED Final   Candida auris NOT DETECTED NOT DETECTED Final   Candida glabrata NOT DETECTED NOT DETECTED Final   Candida krusei NOT DETECTED NOT DETECTED Final   Candida parapsilosis NOT DETECTED NOT DETECTED Final   Candida tropicalis NOT DETECTED NOT DETECTED Final   Cryptococcus neoformans/gattii NOT DETECTED NOT DETECTED Final   CTX-M ESBL NOT DETECTED NOT DETECTED Final   Carbapenem resistance IMP NOT DETECTED NOT DETECTED Final   Carbapenem resistance KPC NOT DETECTED NOT DETECTED Final   Methicillin resistance mecA/C NOT DETECTED NOT DETECTED Final   Carbapenem resistance NDM NOT DETECTED NOT DETECTED Final   Carbapenem resist OXA 48 LIKE NOT DETECTED NOT DETECTED Final   Carbapenem resistance VIM NOT DETECTED NOT DETECTED Final    Comment: Performed at [facility], [address] [place], [place] [address]  MRSA PCR Screening     Status: None   Collection Time: 05/05/20  9:45 PM   Specimen: Nasal Mucosa; Nasopharyngeal  Result Value Ref Range Status   MRSA by PCR NEGATIVE NEGATIVE Final    Comment:        The GeneXpert MRSA Assay (FDA approved for NASAL specimens only), is one component of a comprehensive MRSA colonization surveillance program. It is not intended to diagnose MRSA infection nor to guide or monitor treatment for MRSA infections. Performed at [facility], [place] [address] [place], [place] [address]   Urine culture     Status: None   Collection Time: 05/05/20 11:05 PM   Specimen: In/Out Cath Urine  Result Value Ref Range Status   Specimen Description IN/OUT CATH URINE  Final   Special Requests NONE  Final   Culture   Final    NO GROWTH Performed at [facility], [place] [address], [place], [place] [address]    Report Status 05/07/2020 FINAL  Final  Culture, blood (routine x 2)     Status: None (Preliminary result)   Collection Time: 05/05/20 11:14 PM   Specimen: BLOOD LEFT HAND  Result Value Ref Range Status   Specimen Description BLOOD LEFT HAND  Final   Special Requests   Final    BOTTLES DRAWN AEROBIC AND ANAEROBIC Blood Culture adequate volume   Culture   Final    NO GROWTH 4 DAYS Performed at [facility], [place] [address], [place], [place] [address]    Report Status PENDING  Incomplete  Culture, blood (routine x 2)     Status: None (Preliminary result)   Collection Time: 05/06/20  3:10 AM   Specimen: BLOOD LEFT HAND  Result Value Ref Range Status   Specimen Description BLOOD LEFT HAND  Final   Special Requests   Final    BOTTLES DRAWN AEROBIC ONLY Blood Culture adequate volume   Culture   Final    NO GROWTH 4 DAYS Performed at [facility], [place] [address] [place], [place] [address]    Report Status PENDING  Incomplete  Resp Panel by RT-PCR (Flu A&B, Covid) Nasopharyngeal Swab     Status: None   Collection Time: 05/08/20 12:59 PM   Specimen: Nasopharyngeal Swab; Nasopharyngeal(NP) swabs in vial transport medium  Result Value Ref Range Status   SARS Coronavirus 2 by RT PCR NEGATIVE NEGATIVE Final    Comment: (NOTE) SARS-CoV-2 target nucleic acids are NOT DETECTED.  The SARS-CoV-2 RNA is  generally detectable in upper respiratory specimens during the acute phase of infection. The lowest concentration of SARS-CoV-2 viral copies this assay can detect is 138 copies/mL. A negative result does not preclude SARS-Cov-2 infection and should not be used as the sole basis for treatment or other patient management decisions. A negative result may occur with  improper specimen collection/handling, submission of specimen other than nasopharyngeal swab, presence of viral mutation(s) within the areas targeted by this assay, and  inadequate number of viral copies(<138 copies/mL). A negative result must be combined with clinical observations, patient history, and epidemiological information. The expected result is Negative.  Fact Sheet for Patients:  EntrepreneurPulse.com.au  Fact Sheet for Healthcare Providers:  IncredibleEmployment.be  This test is no t yet approved or cleared by the [place] FDA and  has been authorized for detection and/or diagnosis of SARS-CoV-2 by FDA under an Emergency Use Authorization (EUA). This EUA will remain  in effect (meaning this test can be used) for the duration of the COVID-19 declaration under Section 564(b)(1) of the Act, 21 U.S.C.section 360bbb-3(b)(1), unless the authorization is terminated  or revoked sooner.       Influenza A by PCR NEGATIVE NEGATIVE Final   Influenza B by PCR NEGATIVE NEGATIVE Final    Comment: (NOTE) The Xpert Xpress SARS-CoV-2/FLU/RSV plus assay is intended as an aid in the diagnosis of influenza from Nasopharyngeal swab specimens and should not be used as a sole basis for treatment. Nasal washings and aspirates are unacceptable for Xpert Xpress SARS-CoV-2/FLU/RSV testing.  Fact Sheet for Patients: EntrepreneurPulse.com.au  Fact Sheet for Healthcare Providers: IncredibleEmployment.be  This test is not yet approved or cleared by the [place] FDA and has been authorized for detection and/or diagnosis of SARS-CoV-2 by FDA under an Emergency Use Authorization (EUA). This EUA will remain in effect (meaning this test can be used) for the duration of the COVID-19 declaration under Section 564(b)(1) of the Act, 21 U.S.C. section 360bbb-3(b)(1), unless the authorization is terminated or revoked.  Performed at [facility], [place] [address] [place], [place] [address]       Studies: No results found.  Scheduled Meds: . acetaminophen  650 mg Oral TID  .  anastrozole  1 mg Oral Daily  . Chlorhexidine Gluconate Cloth  6 each Topical Q0600  . citalopram  10 mg Oral Daily  . heparin injection (subcutaneous)  5,000 Units Subcutaneous Q8H  . insulin aspart  1-3 Units Subcutaneous Q4H  . levothyroxine  25 mcg Oral Q0600  . lidocaine  1 patch Transdermal Q24H  . metoprolol tartrate  12.5 mg Oral BID  . pantoprazole  40 mg Oral Daily  . sulfamethoxazole-trimethoprim  2 tablet Oral Q12H    Continuous Infusions: . sodium chloride 10 mL/hr at 05/08/20 2000     LOS: 5 days     Annita Brod, MD [facility]   05/10/2020, 3:17 PM

## 2020-05-10 NOTE — Progress Notes (Signed)
Palliative:  HPI: 74 year old woman with metastatic breast cancer to spine. Admitted 12/17 after a fall. She had apparently been experiencing some increased tremor, some slurred speech preceding the fall. Hypotensive in the emergency department and found to have Klebsiella and staph epi bacteremia, significant lactic acidosis with a probable superimposed nongap acidosis. Volume resuscitation initiated but she had a period of tachypnea, respiratory distress and this prompted Lasix x1 overnight. CT scan of her chest did not show any evidence pulmonary embolism, patchy peribronchovascular peripheral reticulation and groundglass, question infiltrate versus edema versus atelectasis. CT abdomen with a segmental perfusion defect in the anterior mid to lower right kidney suggestive of a possible acute renal infarct. Her code status was confirmed as DNR, no CVC, no pressors.  I met today with Jasmine Hood. No family at bedside. She is sitting up in bed and appears to be comfortably watching television. She shows me the bruising on her arms where she has had lab sticks and PICC line and does confirm that she does not want any more sticks or IV lines. However, she does wish to continue treatment with medications and continue to follow with Jasmine Hood for treatment. She wishes to continue to desire to treat what she can and not prepared for full comfort care. With these wishes I do not feel that hospice is a good option for her at this time. We did discuss these goals and her best option for trial of rehab (she initially told me she wished to return to her home) and she agrees for short term rehab stay at this time.   No changes in goals of care at this time.   Exam: Alert, oriented (although she seems somewhat forgetful and likely not the best historian). No distress. Breathing regular, unlabored. Abd soft. LUE with bruising and edema (reportedly from lab draws and PICC placement and reportedly improving).    Plan: - No changes in goals of care.  - Continue to optimize medically.  - Plans for SNF rehab with palliative to follow.  - MOST previously completed by my colleague.   25 min  Jasmine Sill, NP Palliative Medicine Team Pager [phone] (Please see [email or web address] for schedule) Team Phone [phone]    Greater than 50%  of this time was spent counseling and coordinating care related to the above assessment and plan

## 2020-05-10 NOTE — Progress Notes (Signed)
PHARMACY NOTE:  ANTIMICROBIAL RENAL DOSAGE ADJUSTMENT  Current antimicrobial regimen includes a mismatch between antimicrobial dosage and estimated renal function.  As per policy approved by the Pharmacy & Therapeutics and Medical Executive Committees, the antimicrobial dosage will be adjusted accordingly.  Current antimicrobial dosage:  Bactrim 1 DS BID   Indication: Klebsiella bacteremia   Renal Function:  Estimated Creatinine Clearance: 62.9 mL/min (by C-G formula based on SCr of 0.56 mg/dL). []      On intermittent HD, scheduled: []      On CRRT    Antimicrobial dosage has been changed to:  Bactrim 2 DS BID through 12/26   Additional comments: Given patient's weight and kidney function will increase dose to optimize bioavailability and drug levels for bacteremia    Thank you for allowing pharmacy to be a part of this patient's care.  Jimmy Footman, PharmD, BCPS, BCIDP Infectious Diseases Clinical Pharmacist Phone: [phone] 05/10/2020 10:01 AM

## 2020-05-10 NOTE — Progress Notes (Addendum)
Patient is refusing to have morning labs drawn by phlebotomy. This nurse educated the patient on the purpose and benefits of collecting labs to provide appropriate treatment during this hospitalization. Patient continues to state "I do not want anything stuck in my arm." Will continue to reassess. TRH aware.   Antonieta Pert, RN 05/10/2020 [phone]

## 2020-05-11 ENCOUNTER — Encounter (HOSPITAL_COMMUNITY): Payer: Self-pay | Admitting: Critical Care Medicine

## 2020-05-11 LAB — COMPREHENSIVE METABOLIC PANEL
ALT: 34 U/L (ref 0–44)
AST: 58 U/L — ABNORMAL HIGH (ref 15–41)
Albumin: 1.8 g/dL — ABNORMAL LOW (ref 3.5–5.0)
Alkaline Phosphatase: 167 U/L — ABNORMAL HIGH (ref 38–126)
Anion gap: 8 (ref 5–15)
BUN: 11 mg/dL (ref 8–23)
CO2: 23 mmol/L (ref 22–32)
Calcium: 7.2 mg/dL — ABNORMAL LOW (ref 8.9–10.3)
Chloride: 103 mmol/L (ref 98–111)
Creatinine, Ser: 0.55 mg/dL (ref 0.44–1.00)
GFR, Estimated: >60 mL/min (ref >60)
Glucose, Bld: 91 mg/dL (ref 70–99)
Potassium: 4.8 mmol/L (ref 3.5–5.1)
Sodium: 134 mmol/L — ABNORMAL LOW (ref 135–145)
Total Bilirubin: 0.6 mg/dL (ref 0.3–1.2)
Total Protein: 5.8 g/dL — ABNORMAL LOW (ref 6.5–8.1)

## 2020-05-11 LAB — CBC
HCT: 25.8 % — ABNORMAL LOW (ref 36.0–46.0)
Hemoglobin: 7.7 g/dL — ABNORMAL LOW (ref 12.0–15.0)
MCH: 29.6 pg (ref 26.0–34.0)
MCHC: 29.8 g/dL — ABNORMAL LOW (ref 30.0–36.0)
MCV: 99.2 fL (ref 80.0–100.0)
Platelets: 68 10*3/uL — ABNORMAL LOW (ref 150–400)
RBC: 2.6 MIL/uL — ABNORMAL LOW (ref 3.87–5.11)
RDW: 17.5 % — ABNORMAL HIGH (ref 11.5–15.5)
WBC: 4.5 10*3/uL (ref 4.0–10.5)
nRBC: 0 % (ref 0.0–0.2)

## 2020-05-11 LAB — CULTURE, BLOOD (ROUTINE X 2)
Culture: NO GROWTH
Culture: NO GROWTH
Special Requests: ADEQUATE
Special Requests: ADEQUATE

## 2020-05-11 LAB — GLUCOSE, CAPILLARY
Glucose-Capillary: 92 mg/dL (ref 70–99)
Glucose-Capillary: 120 mg/dL — ABNORMAL HIGH (ref 70–99)
Glucose-Capillary: 142 mg/dL — ABNORMAL HIGH (ref 70–99)
Glucose-Capillary: 115 mg/dL — ABNORMAL HIGH (ref 70–99)
Glucose-Capillary: 111 mg/dL — ABNORMAL HIGH (ref 70–99)

## 2020-05-11 NOTE — Progress Notes (Signed)
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 Pt with Klebsiella and staph epi bacteremia with suspected right renal infarct. 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  Her energy is low, has discomfort on LUE from edema and bruising which was not an issue until nursing came by to help her with meds.  Pt was with family who were very supportive of her efforts, pt apparently is going with this person to SNF on 12/27.  Follow up to try to increase OOB time, to sit up in chair and increase her trunk and LE strength, balance and endurance.    Follow Up Recommendations  SNF     Equipment Recommendations  None recommended by PT    Recommendations for Other Services       Precautions / Restrictions Precautions Precautions: Fall Precaution Comments: monitor vitals, sats and HR Restrictions Weight Bearing Restrictions: No    Mobility  Bed Mobility Overal bed mobility: Needs Assistance Bed Mobility: Rolling (scooting on bed) Rolling: Mod assist         General bed mobility comments: mod assist to scoot  her across bed to reposition L arm  Transfers                 General transfer comment: declined  Ambulation/Gait                 Stairs             Wheelchair Mobility    Modified Rankin (Stroke Patients Only)       Balance                                            Cognition Arousal/Alertness: Awake/alert Behavior During Therapy: WFL for tasks assessed/performed Overall Cognitive Status: Within Functional Limits for tasks assessed                                        Exercises General  Exercises - Lower Extremity Ankle Circles/Pumps: AAROM;5 reps Heel Slides: AAROM;10 reps Hip ABduction/ADduction: AAROM;10 reps Straight Leg Raises: AAROM;10 reps    General Comments        Pertinent Vitals/Pain Pain Assessment: Faces Faces Pain Scale: Hurts a little bit Pain Location: generally on LE's    Home Living                      Prior Function            PT Goals (current goals can now be found in the care plan section) Acute Rehab PT Goals Patient Stated Goal: to get home Progress towards PT goals: Not progressing toward goals - comment    Frequency    Min 2X/week      PT Plan Current plan remains appropriate    Co-evaluation              AM-PAC PT "6 Clicks" Mobility   Outcome Measure  Help needed  turning from your back to your side while in a flat bed without using bedrails?: A Little Help needed moving from lying on your back to sitting on the side of a flat bed without using bedrails?: A Little Help needed moving to and from a bed to a chair (including a wheelchair)?: A Lot Help needed standing up from a chair using your arms (e.g., wheelchair or bedside chair)?: A Lot Help needed to walk in hospital room?: A Lot Help needed climbing 3-5 steps with a railing? : A Lot 6 Click Score: 14    End of Session Equipment Utilized During Treatment: Oxygen Activity Tolerance: Patient tolerated treatment well Patient left: in bed;with bed alarm set Nurse Communication: Mobility status PT Visit Diagnosis: Other abnormalities of gait and mobility (R26.89);Difficulty in walking, not elsewhere classified (R26.2);Muscle weakness (generalized) (M62.81)     Time: 0300-9233 PT Time Calculation (min) (ACUTE ONLY): 23 min  Charges:  $Therapeutic Exercise: 8-22 mins $Therapeutic Activity: 8-22 mins          Ramond Dial 05/11/2020, 7:57 PM   Mee Hives, PT MS Acute Rehab Dept. Number: [place] and [place]

## 2020-05-11 NOTE — TOC Initial Note (Signed)
Transition of Care [facility]) - Initial/Assessment Note    Patient Details  Name: Jasmine Hood MRN: [id number] Date of Birth: 08/22/1945  [facility]) CM/SW Contact:    Trula Ore, Jessie Phone Number: 05/11/2020, 3:23 PM  Clinical Narrative:                   CSW received consult for possible SNF placement at time of discharge. CSW spoke with patient regarding PT recommendation of SNF placement at time of discharge. Patient comes from home with daughter.Patient expressed understanding of PT recommendation and is agreeable to SNF placement at time of discharge. Patient gave CSW permission to fax out initial referral near [place] area.  Patient has received the COVID vaccines.  No further questions reported at this time. CSW to continue to follow and assist with discharge planning needs.  Expected Discharge Plan: Skilled Nursing Facility Barriers to Discharge: Continued Medical Work up   Patient Goals and CMS Choice Patient states their goals for this hospitalization and ongoing recovery are:: to go to SNF CMS Medicare.gov Compare Post Acute Care list provided to:: Patient Choice offered to / list presented to : Patient  Expected Discharge Plan and Services Expected Discharge Plan: [place] arrangements for the past 2 months: Single Family Home                                      Prior Living Arrangements/Services Living arrangements for the past 2 months: Single Family Home Lives with:: [place] (lives with oldest daughter) Patient language and need for interpreter reviewed:: Yes Do you feel safe going back to the place where you live?: No   SNF  Need for Family Participation in Patient Care: Yes (Comment) Care giver support system in place?: Yes (comment)   Criminal Activity/Legal Involvement Pertinent to Current Situation/Hospitalization: No - Comment as needed  Activities of Daily Living Home  Assistive Devices/Equipment: Cane (specify quad or straight),Walker (specify type) ADL Screening (condition at time of admission) Patient's cognitive ability adequate to safely complete daily activities?: Yes Is the patient deaf or have difficulty hearing?: No Does the patient have difficulty seeing, even when wearing glasses/contacts?: No Does the patient have difficulty concentrating, remembering, or making decisions?: No Patient able to express need for assistance with ADLs?: Yes Does the patient have difficulty dressing or bathing?: Yes Independently performs ADLs?: No Does the patient have difficulty walking or climbing stairs?: Yes Weakness of Legs: Both Weakness of Arms/Hands: None  Permission Sought/Granted Permission sought to share information with : Case Manager,Family Chief Financial Officer Permission granted to share information with : Yes, Verbal Permission Granted  Share Information with NAME: [name]  Permission granted to share info w AGENCY: SNF  Permission granted to share info w Relationship: son  Permission granted to share info w Contact Information: [name] [phone]  Emotional Assessment Appearance:: Appears stated age Attitude/Demeanor/Rapport: Gracious Affect (typically observed): Calm Orientation: : Oriented to Self,Oriented to Place,Oriented to  Time,Oriented to Situation Alcohol / Substance Use: Not Applicable Psych Involvement: No (comment)  Admission diagnosis:  Sepsis [facility]) [A41.9] Patient Active Problem List   Diagnosis Date Noted  . Renal infarct ([place]) 05/10/2020  . Palliative care by specialist   . Advanced care planning/counseling discussion   . Goals of care, counseling/discussion   . Stroke ([place]) 03/10/2020  . COVID-19 03/10/2020  . Sepsis (  [place]) 11/10/2019  . Changing skin lesion 11/07/2018  . History of right breast cancer 06/05/2018  . Acquired absence of breast and nipple, bilateral 06/05/2018  . Cellulitis 06/09/2017   . Acute lower UTI 02/20/2017  . Elevated lactic acid level   . Urinary tract infection without hematuria   . Weakness   . Generalized weakness 02/19/2017  . AKI (acute kidney injury) ([place]) 02/12/2017  . Dehydration 02/12/2017  . Abnormal TSH 02/12/2017  . Genetic testing 11/27/2016  . Port catheter in place 11/14/2016  . Bone metastases ([place]) 11/11/2016  . Transaminitis 11/05/2016  . Malignant neoplasm of overlapping sites of right breast in female, estrogen receptor positive ([place]) 10/21/2016  . Encounter for follow-up surveillance of colon cancer   . Benign neoplasm of ascending colon   . Benign neoplasm of descending colon   . Benign neoplasm of transverse colon   . Benign neoplasm of cecum   . OSA (obstructive sleep apnea) 09/14/2012  . Contact dermatitis and eczema due to plant 12/31/2011  . Hypothyroidism 02/01/2010  . MORBID OBESITY 01/05/2009  . Essential hypertension 01/05/2009  . History of colonic polyps 01/05/2009   PCP:  [name], [name], MD Pharmacy:   [id number] [place], [place] [address] Phone: [phone] Fax: [phone]     Social Determinants of Health (SDOH) Interventions    Readmission Risk Interventions No flowsheet data found.

## 2020-05-11 NOTE — NC FL2 (Signed)
[place] LEVEL OF CARE SCREENING TOOL     IDENTIFICATION  Patient Name: Jasmine Hood Birthdate: [birth year] Sex: female Admission Date (Current Location): 05/05/2020  [facility] and [place] Number:  Herbalist and Address:  [facility]. [facility], [place] [address], [place], [place] [address]      Provider Number: [id number]  Attending Physician Name and Address:  Annita Brod, MD  Relative Name and Phone Number:  [name] [phone]    Current Level of Care: Hospital Recommended Level of Care: [place] Prior Approval Number:    Date Approved/Denied:   PASRR Number: [id number]  Discharge Plan: SNF    Current Diagnoses: Patient Active Problem List   Diagnosis Date Noted  . Renal infarct ([place]) 05/10/2020  . Palliative care by specialist   . Advanced care planning/counseling discussion   . Goals of care, counseling/discussion   . Stroke ([place]) 03/10/2020  . COVID-19 03/10/2020  . Sepsis ([place]) 11/10/2019  . Changing skin lesion 11/07/2018  . History of right breast cancer 06/05/2018  . Acquired absence of breast and nipple, bilateral 06/05/2018  . Cellulitis 06/09/2017  . Acute lower UTI 02/20/2017  . Elevated lactic acid level   . Urinary tract infection without hematuria   . Weakness   . Generalized weakness 02/19/2017  . AKI (acute kidney injury) ([place]) 02/12/2017  . Dehydration 02/12/2017  . Abnormal TSH 02/12/2017  . Genetic testing 11/27/2016  . Port catheter in place 11/14/2016  . Bone metastases ([place]) 11/11/2016  . Transaminitis 11/05/2016  . Malignant neoplasm of overlapping sites of right breast in female, estrogen receptor positive ([place]) 10/21/2016  . Encounter for follow-up surveillance of colon cancer   . Benign neoplasm of ascending colon   . Benign neoplasm of descending colon   . Benign neoplasm of transverse colon   . Benign neoplasm of cecum   . OSA (obstructive sleep apnea) 09/14/2012  .  Contact dermatitis and eczema due to plant 12/31/2011  . Hypothyroidism 02/01/2010  . MORBID OBESITY 01/05/2009  . Essential hypertension 01/05/2009  . History of colonic polyps 01/05/2009    Orientation RESPIRATION BLADDER Height & Weight     Self,Time,Situation,Place  O2 (Nasal Cannula 2 liters) Incontinent,External catheter (External Urinary Catheter) Weight: 220 lb 7.4 oz (100 kg) Height:  4\' 11"  (149.9 cm)  BEHAVIORAL SYMPTOMS/MOOD NEUROLOGICAL BOWEL NUTRITION STATUS      Incontinent Diet (See Discharge Summary)  AMBULATORY STATUS COMMUNICATION OF NEEDS Skin   Limited Assist Verbally Other (Comment) (Ecchymosis arm left wound/incison (open or dehisced) (MASD) moisture associated skin damage buttocks right; left;medial)                       Personal Care Assistance Level of Assistance  Bathing,Dressing,Feeding Bathing Assistance: Maximum assistance Feeding assistance: Limited assistance (able to feed self; Needs help sitting up) Dressing Assistance: Maximum assistance     Functional Limitations Info  Sight,Hearing,Speech Sight Info: Adequate Hearing Info: Adequate Speech Info: Adequate    SPECIAL CARE FACTORS FREQUENCY  PT (By licensed PT),OT (By licensed OT)     PT Frequency: 5x min weekly OT Frequency: 5x min weekly            Contractures Contractures Info: Not present    Additional Factors Info  Code Status,Allergies,Psychotropic,Insulin Sliding Scale Code Status Info: DNR Allergies Info: Bee Venom,Latex Psychotropic Info: citalopram (CELEXA) tablet 10 mg daily Insulin Sliding Scale Info: insulin aspart (novoLOG) injection 1-3 Units every 4  hours,       Current Medications (05/11/2020):  This is the current hospital active medication list Current Facility-Administered Medications  Medication Dose Route Frequency Provider Last Rate Last Admin  . 0.9 %  sodium chloride infusion   Intravenous PRN Annita Brod, MD 10 mL/hr at 05/08/20 2000  Infusion Verify at 05/08/20 2000  . acetaminophen (TYLENOL) tablet 650 mg  650 mg Oral TID Annita Brod, MD   650 mg at 05/11/20 1045  . albuterol (PROVENTIL) (2.5 MG/3ML) 0.083% nebulizer solution 2.5 mg  2.5 mg Nebulization Q2H PRN Annita Brod, MD   2.5 mg at 05/08/20 2243  . anastrozole (ARIMIDEX) tablet 1 mg  1 mg Oral Daily Annita Brod, MD   1 mg at 05/10/20 1683  . Chlorhexidine Gluconate Cloth 2 % PADS 6 each  6 each Topical Q0600 Annita Brod, MD   6 each at 05/11/20 0601  . citalopram (CELEXA) tablet 10 mg  10 mg Oral Daily Annita Brod, MD   10 mg at 05/11/20 1045  . docusate sodium (COLACE) capsule 100 mg  100 mg Oral BID PRN Annita Brod, MD      . heparin injection 5,000 Units  5,000 Units Subcutaneous Q8H Annita Brod, MD   5,000 Units at 05/09/20 0532  . insulin aspart (novoLOG) injection 1-3 Units  1-3 Units Subcutaneous Q4H Annita Brod, MD   2 Units at 05/10/20 2043  . levothyroxine (SYNTHROID) tablet 25 mcg  25 mcg Oral Q0600 Annita Brod, MD   25 mcg at 05/11/20 0601  . lidocaine (LIDODERM) 5 % 1 patch  1 patch Transdermal Q24H Annita Brod, MD   1 patch at 05/10/20 [phone]  . metoprolol tartrate (LOPRESSOR) tablet 12.5 mg  12.5 mg Oral BID Annita Brod, MD   12.5 mg at 05/11/20 1044  . pantoprazole (PROTONIX) EC tablet 40 mg  40 mg Oral Daily Annita Brod, MD   40 mg at 05/11/20 1045  . polyethylene glycol (MIRALAX / GLYCOLAX) packet 17 g  17 g Oral Daily PRN Annita Brod, MD      . sulfamethoxazole-trimethoprim (BACTRIM DS) 800-160 MG per tablet 2 tablet  2 tablet Oral Q12H Annita Brod, MD   2 tablet at 05/10/20 2129  . traZODone (DESYREL) tablet 50 mg  50 mg Oral QHS PRN Annita Brod, MD         Discharge Medications: Please see discharge summary for a list of discharge medications.  Relevant Imaging Results:  Relevant Lab Results:   Additional  Information [phone]  Trula Ore, LCSWA

## 2020-05-11 NOTE — Care Management Important Message (Signed)
Important Message  Patient Details  Name: Jasmine Hood MRN: [id number] Date of Birth: 1945-06-18   Medicare Important Message Given:  Yes     [name] 05/11/2020, 11:51 AM

## 2020-05-11 NOTE — Progress Notes (Addendum)
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  Patient was treated with antibiotics and cultures grew out Klebsiella and possibly staph epidermis.  Course was complicated by stridor and a renal infarction requiring heparin which led to a right upper extremity hematoma.  Palliative care consulted.  Patient seen by physical and Occupational Therapy who are recommending skilled nursing.   Today, patient little tired, otherwise no complaints.  Hemoglobin dropped to 7.7 over the last 2 days.  No evidence of active bleeding at this time.  Assessment/Plan: Active Problems:   Hypothyroidism: Continue Synthroid.  Follow-up TSH as outpatient    MORBID OBESITY: Meets criteria with BMI greater than 40.    Essential hypertension: Currently not on any medications, blood pressure more reasonable over the past few days.    OSA (obstructive sleep apnea): Not on CPAP.    Transaminitis: Chronic, stable   AKI (acute kidney injury) ([place]): Causing ATN.  Secondary to severe sepsis.  Creatinine now normalized.  Now resolved.  Severe Klebsiella sepsis present on admission [facility]) which developed into septic shock within 24 hours: Secondary to acute UTI.  Patient met criteria for severe sepsis and then septic shock given urinary infection plus lactic acidosis.  Treated aggressively with fluids and antibiotics.  No  growth from urine culture.    Renal infarct [facility]): Was on IV heparin which was discontinued after developed hematoma.  Anemia: Chronic macrocytic anemia, likely from chronic disease-sleep apnea and hypertension.  Baseline looks to be around 10-11 with macrocytosis.  Suspect that she has acute blood loss anemia from her hematoma.  Monitor closely   Code Status: DNR  Family Communication: Left message for son.    Disposition Plan: We will need short-term skilled nursing, being worked on by transitions of care.   Consultants:  Palliative care  CCM   Procedures:  Preserved ejection fraction, no evidence of diastolic dysfunction, no significant valvular dysfunction  Antimicrobials:  IV vancomycin and Flagyl 12/17 only  IV cefepime 12/17-12/18  IV Rocephin 12/18-present  DVT prophylaxis: SCDs, previously on heparin   Objective: Vitals:   05/11/20 0843 05/11/20 1122  BP: (!) 115/56 (!) 135/52  Pulse: 95 86  Resp: (!) 30 (!) 24  Temp: 98 F (36.7 C) 98.1 F (36.7 C)  SpO2: 95% 94%    Intake/Output Summary (Last 24 hours) at 05/11/2020 1348 Last data filed at 05/11/2020 0900 Gross per 24 hour  Intake 720 ml  Output 520 ml  Net 200 ml   Filed Weights   05/07/20 0359 05/08/20 0414 05/11/20 0500  Weight: 99.4 kg 101.8 kg 100 kg   Body mass index is 44.53 kg/m.  Exam:   General: Alert and oriented x3, no acute distress  HEENT: Normocephalic and atraumatic mucous membranes slightly dry  Cardiovascular: Regular rate and rhythm, S1-S2  Respiratory: Clear to auscultation bilaterally  Abdomen: Soft, obese, nontender, positive bowel sounds  Musculoskeletal: No clubbing or cyanosis, trace pitting edema  Skin: No skin breaks, tears or lesions  Psychiatry: Appropriate, no evidence of psychoses   Data Reviewed: CBC: Recent Labs  Lab 05/05/20 1436 05/05/20 2338 05/06/20 0310 05/07/20 0327 05/07/20 1112 05/07/20 1959 05/08/20 0159 05/09/20 0156  05/11/20 0126  WBC 15.8*  --  12.2* 8.0  --   --  7.3 6.3 4.5  NEUTROABS 14.9*  --   --   --   --   --   --   --   --   HGB 9.3*   < > 8.8* 7.4* 6.6* 8.3* 8.4* 8.2* 7.7*  HCT 30.5*   < > 27.8* 23.3* 21.1* 26.0* 26.8* 25.4* 25.8*  MCV 103.0*  --  102.2* 99.1  --   --  95.4 95.1 99.2  PLT 130*  --  98* 68*  --   --  55* 60* 68*   < > = values in this interval not displayed.   Basic Metabolic Panel: Recent Labs  Lab 05/06/20 0310 05/06/20 0918 05/07/20 0327 05/08/20 0159 05/09/20 0156 05/11/20 0126  NA 134*  --  128* 131* 133* 134*  K 4.0  --  4.0 4.1 4.2 4.8  CL 105  --  101 103 103 103  CO2 16*  --  18* 20* 22 23  GLUCOSE 162*  --  156* 120* 152* 91  BUN 15  --  _0 CREATININE 0.88  --  0.69 0.63 0.56 0.55  CALCIUM 7.8*  --  7.0* 7.2* 7.2* 7.2*  MG  --  1.2* 2.1  --   --   --   PHOS  --  2.2* 4.2  --   --   --    GFR: Estimated Creatinine Clearance: 62.2 mL/min (by C-G formula based on SCr of 0.55 mg/dL). Liver Function Tests: Recent Labs  Lab 05/05/20 1436 05/09/20 0156 05/11/20 0126  AST 109* 58* 58*  ALT 37 38 34  ALKPHOS 110 133* 167*  BILITOT 1.2 0.9 0.6  PROT 6.8 5.9* 5.8*  ALBUMIN 2.1* 1.8* 1.8*   No results for input(s): LIPASE, AMYLASE in the last 168 hours. No results for input(s): AMMONIA in the last 168 hours. Coagulation Profile: Recent Labs  Lab 05/05/20 1525  INR 1.4*   Cardiac Enzymes: Recent Labs  Lab 05/05/20 1436  CKTOTAL 45   BNP (last 3 results) No results for input(s): PROBNP in the last 8760 hours. HbA1C: No results for input(s): HGBA1C in the last 72 hours. CBG: Recent Labs  Lab 05/10/20 1924 05/10/20 2352 05/11/20 0322 05/11/20 0841 05/11/20 1124  GLUCAP 166* 96 92 120* 142*   Lipid Profile: No results for input(s): CHOL, HDL, LDLCALC, TRIG, CHOLHDL, LDLDIRECT in the last 72 hours. Thyroid Function Tests: No results for input(s): TSH, T4TOTAL, FREET4, T3FREE, THYROIDAB in the last 72 hours. Anemia Panel: No  results for input(s): VITAMINB12, FOLATE, FERRITIN, TIBC, IRON, RETICCTPCT in the last 72 hours. Urine analysis:    Component Value Date/Time   COLORURINE YELLOW 05/05/2020 2305   APPEARANCEUR CLOUDY (A) 05/05/2020 2305   LABSPEC 1.045 (H) 05/05/2020 2305   PHURINE 5.0 05/05/2020 2305   GLUCOSEU NEGATIVE 05/05/2020 2305   GLUCOSEU NEGATIVE 01/25/2010 0858   HGBUR SMALL (A) 05/05/2020 2305   HGBUR 1+ 12/30/2008 0838   BILIRUBINUR NEGATIVE 05/05/2020 2305   BILIRUBINUR Neg 04/28/2015 1122   KETONESUR NEGATIVE 05/05/2020 2305   PROTEINUR NEGATIVE 05/05/2020 2305   UROBILINOGEN 0.2 04/28/2015 1122   UROBILINOGEN 0.2 01/25/2010 0858   NITRITE  NEGATIVE 05/05/2020 2305   LEUKOCYTESUR LARGE (A) 05/05/2020 2305   Sepsis Labs: _0 (procalcitonin:4,lacticidven:4)  ) Recent Results (from the past 240 hour(s))  Blood culture (routine single)     Status: Abnormal   Collection Time: 05/05/20  3:43 PM   Specimen: BLOOD  Result Value Ref Range Status   Specimen Description BLOOD LEFT ANTECUBITAL  Final   Special Requests   Final    BOTTLES DRAWN AEROBIC AND ANAEROBIC Blood Culture adequate volume   Culture  Setup Time   Final    IN BOTH AEROBIC AND ANAEROBIC BOTTLES GRAM NEGATIVE RODS Organism ID to follow GRAM POSITIVE COCCI CRITICAL RESULT CALLED TO, READ BACK BY AND VERIFIED WITH: J. MILLEN PHARMD, AT 6948 05/06/20 BY D. VANHOOK    Culture (A)  Final    KLEBSIELLA PNEUMONIAE STAPHYLOCOCCUS EPIDERMIDIS THE SIGNIFICANCE OF ISOLATING THIS ORGANISM FROM A SINGLE SET OF BLOOD CULTURES WHEN MULTIPLE SETS ARE DRAWN IS UNCERTAIN. PLEASE NOTIFY THE MICROBIOLOGY DEPARTMENT WITHIN ONE WEEK IF SPECIATION AND SENSITIVITIES ARE REQUIRED. Performed at [facility], [place] [address], [place], [place] [address]    Report Status 05/08/2020 FINAL  Final   Organism ID, Bacteria KLEBSIELLA PNEUMONIAE  Final      Susceptibility   Klebsiella pneumoniae - MIC*    AMPICILLIN >=32 RESISTANT  Resistant     CEFAZOLIN 8 SENSITIVE Sensitive     CEFEPIME <=0.12 SENSITIVE Sensitive     CEFTAZIDIME <=1 SENSITIVE Sensitive     CEFTRIAXONE <=0.25 SENSITIVE Sensitive     CIPROFLOXACIN <=0.25 SENSITIVE Sensitive     GENTAMICIN <=1 SENSITIVE Sensitive     IMIPENEM <=0.25 SENSITIVE Sensitive     TRIMETH/SULFA <=20 SENSITIVE Sensitive     AMPICILLIN/SULBACTAM >=32 RESISTANT Resistant     PIP/TAZO >=128 RESISTANT Resistant     Extended ESBL NOT DONE      * KLEBSIELLA PNEUMONIAE  Blood Culture ID Panel (Reflexed)     Status: Abnormal   Collection Time: 05/05/20  3:43 PM  Result Value Ref Range Status   Enterococcus faecalis NOT DETECTED NOT DETECTED Final   Enterococcus Faecium NOT DETECTED NOT DETECTED Final   Listeria monocytogenes NOT DETECTED NOT DETECTED Final   Staphylococcus species DETECTED (A) NOT DETECTED Final    Comment: CRITICAL RESULT CALLED TO, READ BACK BY AND VERIFIED WITH: J. MILLEN PHARMD, AT 0350 05/06/20 BY D. VANHOOK    Staphylococcus aureus (BCID) NOT DETECTED NOT DETECTED Final   Staphylococcus epidermidis DETECTED (A) NOT DETECTED Final    Comment: CRITICAL RESULT CALLED TO, READ BACK BY AND VERIFIED WITH: J. MILLEN PHARMD, AT 0938 05/06/20 BY D. VANHOOK    Staphylococcus lugdunensis NOT DETECTED NOT DETECTED Final   Streptococcus species NOT DETECTED NOT DETECTED Final   Streptococcus agalactiae NOT DETECTED NOT DETECTED Final   Streptococcus pneumoniae NOT DETECTED NOT DETECTED Final   Streptococcus pyogenes NOT DETECTED NOT DETECTED Final   A.calcoaceticus-baumannii NOT DETECTED NOT DETECTED Final   Bacteroides fragilis NOT DETECTED NOT DETECTED Final   Enterobacterales DETECTED (A) NOT DETECTED Final    Comment: Enterobacterales represent a large order of gram negative bacteria, not a single organism. CRITICAL RESULT CALLED TO, READ BACK BY AND VERIFIED WITH: J. MILLEN PHARMD, AT 1829 05/06/20 BY D. VANHOOK    Enterobacter cloacae complex NOT DETECTED  NOT DETECTED Final   Escherichia coli NOT DETECTED NOT DETECTED Final   Klebsiella aerogenes NOT DETECTED NOT DETECTED Final   Klebsiella oxytoca NOT DETECTED NOT DETECTED Final   Klebsiella  pneumoniae DETECTED (A) NOT DETECTED Final    Comment: CRITICAL RESULT CALLED TO, READ BACK BY AND VERIFIED WITH: J. MILLEN PHARMD, AT 4536 05/06/20 BY D. VANHOOK    Proteus species NOT DETECTED NOT DETECTED Final   Salmonella species NOT DETECTED NOT DETECTED Final   Serratia marcescens NOT DETECTED NOT DETECTED Final   Haemophilus influenzae NOT DETECTED NOT DETECTED Final   Neisseria meningitidis NOT DETECTED NOT DETECTED Final   Pseudomonas aeruginosa NOT DETECTED NOT DETECTED Final   Stenotrophomonas maltophilia NOT DETECTED NOT DETECTED Final   Candida albicans NOT DETECTED NOT DETECTED Final   Candida auris NOT DETECTED NOT DETECTED Final   Candida glabrata NOT DETECTED NOT DETECTED Final   Candida krusei NOT DETECTED NOT DETECTED Final   Candida parapsilosis NOT DETECTED NOT DETECTED Final   Candida tropicalis NOT DETECTED NOT DETECTED Final   Cryptococcus neoformans/gattii NOT DETECTED NOT DETECTED Final   CTX-M ESBL NOT DETECTED NOT DETECTED Final   Carbapenem resistance IMP NOT DETECTED NOT DETECTED Final   Carbapenem resistance KPC NOT DETECTED NOT DETECTED Final   Methicillin resistance mecA/C NOT DETECTED NOT DETECTED Final   Carbapenem resistance NDM NOT DETECTED NOT DETECTED Final   Carbapenem resist OXA 48 LIKE NOT DETECTED NOT DETECTED Final   Carbapenem resistance VIM NOT DETECTED NOT DETECTED Final    Comment: Performed at [facility], [address] [place], [place] [address]  MRSA PCR Screening     Status: None   Collection Time: 05/05/20  9:45 PM   Specimen: Nasal Mucosa; Nasopharyngeal  Result Value Ref Range Status   MRSA by PCR NEGATIVE NEGATIVE Final    Comment:        The GeneXpert MRSA Assay (FDA approved for NASAL specimens only), is one component of  a comprehensive MRSA colonization surveillance program. It is not intended to diagnose MRSA infection nor to guide or monitor treatment for MRSA infections. Performed at [facility], [place] [address], [place], [place] [address]   Urine culture     Status: None   Collection Time: 05/05/20 11:05 PM   Specimen: In/Out Cath Urine  Result Value Ref Range Status   Specimen Description IN/OUT CATH URINE  Final   Special Requests NONE  Final   Culture   Final    NO GROWTH Performed at [facility], [place] [address], [place], [place] [address]    Report Status 05/07/2020 FINAL  Final  Culture, blood (routine x 2)     Status: None   Collection Time: 05/05/20 11:14 PM   Specimen: BLOOD LEFT HAND  Result Value Ref Range Status   Specimen Description BLOOD LEFT HAND  Final   Special Requests   Final    BOTTLES DRAWN AEROBIC AND ANAEROBIC Blood Culture adequate volume   Culture   Final    NO GROWTH 5 DAYS Performed at [facility], [place] [address], [place], [place] [address]    Report Status 05/11/2020 FINAL  Final  Culture, blood (routine x 2)     Status: None   Collection Time: 05/06/20  3:10 AM   Specimen: BLOOD LEFT HAND  Result Value Ref Range Status   Specimen Description BLOOD LEFT HAND  Final   Special Requests   Final    BOTTLES DRAWN AEROBIC ONLY Blood Culture adequate volume   Culture   Final    NO GROWTH 5 DAYS Performed at [facility], [place] [address], [place], [place]  [address]    Report Status 05/11/2020 FINAL  Final  Resp Panel by RT-PCR (Flu A&B, Covid) Nasopharyngeal Swab     Status: None   Collection Time: 05/08/20 12:59 PM   Specimen: Nasopharyngeal Swab; Nasopharyngeal(NP) swabs in vial transport medium  Result Value Ref Range Status   SARS Coronavirus 2 by RT PCR NEGATIVE NEGATIVE Final    Comment: (NOTE) SARS-CoV-2 target nucleic acids are NOT DETECTED.  The SARS-CoV-2 RNA is generally detectable in upper respiratory specimens during  the acute phase of infection. The lowest concentration of SARS-CoV-2 viral copies this assay can detect is 138 copies/mL. A negative result does not preclude SARS-Cov-2 infection and should not be used as the sole basis for treatment or other patient management decisions. A negative result may occur with  improper specimen collection/handling, submission of specimen other than nasopharyngeal swab, presence of viral mutation(s) within the areas targeted by this assay, and inadequate number of viral copies(<138 copies/mL). A negative result must be combined with clinical observations, patient history, and epidemiological information. The expected result is Negative.  Fact Sheet for Patients:  EntrepreneurPulse.com.au  Fact Sheet for Healthcare Providers:  IncredibleEmployment.be  This test is no t yet approved or cleared by the [place] FDA and  has been authorized for detection and/or diagnosis of SARS-CoV-2 by FDA under an Emergency Use Authorization (EUA). This EUA will remain  in effect (meaning this test can be used) for the duration of the COVID-19 declaration under Section 564(b)(1) of the Act, 21 U.S.C.section 360bbb-3(b)(1), unless the authorization is terminated  or revoked sooner.       Influenza A by PCR NEGATIVE NEGATIVE Final   Influenza B by PCR NEGATIVE NEGATIVE Final    Comment: (NOTE) The Xpert Xpress SARS-CoV-2/FLU/RSV plus assay is intended as an aid in the diagnosis of influenza from Nasopharyngeal swab specimens and should not be used as a sole basis for treatment. Nasal washings and aspirates are unacceptable for Xpert Xpress SARS-CoV-2/FLU/RSV testing.  Fact Sheet for Patients: EntrepreneurPulse.com.au  Fact Sheet for Healthcare Providers: IncredibleEmployment.be  This test is not yet approved or cleared by the [place] FDA and has been authorized for detection and/or  diagnosis of SARS-CoV-2 by FDA under an Emergency Use Authorization (EUA). This EUA will remain in effect (meaning this test can be used) for the duration of the COVID-19 declaration under Section 564(b)(1) of the Act, 21 U.S.C. section 360bbb-3(b)(1), unless the authorization is terminated or revoked.  Performed at [facility], [place] [address], [place], [place] [address]       Studies: No results found.  Scheduled Meds: . acetaminophen  650 mg Oral TID  . anastrozole  1 mg Oral Daily  . Chlorhexidine Gluconate Cloth  6 each Topical Q0600  . citalopram  10 mg Oral Daily  . heparin injection (subcutaneous)  5,000 Units Subcutaneous Q8H  . insulin aspart  1-3 Units Subcutaneous Q4H  . levothyroxine  25 mcg Oral Q0600  . lidocaine  1 patch Transdermal Q24H  . metoprolol tartrate  12.5 mg Oral BID  . pantoprazole  40 mg Oral Daily  . sulfamethoxazole-trimethoprim  2 tablet Oral Q12H    Continuous Infusions: . sodium chloride 10 mL/hr at 05/08/20 2000     LOS: 6 days     Annita Brod, MD [facility]   05/11/2020, 1:48 PM

## 2020-05-11 NOTE — Plan of Care (Signed)
  Problem: Activity: Goal: Risk for activity intolerance will decrease Outcome: Progressing   Problem: Nutrition: Goal: Adequate nutrition will be maintained Outcome: Progressing   Problem: Coping: Goal: Level of anxiety will decrease Outcome: Progressing   

## 2020-05-11 NOTE — Progress Notes (Signed)
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 Pt with Klebsiella and staph epi bacteremia with suspected right renal infarct. PMhx: left cerebellar infarct 10/21, hypothryroidism, HTN, covid 07 Mar 2020   OT comments  Patient making slow progress with OT and patient goals.  Patient requesting bedpan usage upon entering, OT suggested out of bed to [facility] would be better and easier for her.  Patient able to transfer with min A of 2 and a RW.  She continues to need Dep care for hygiene and up to Mod A for bed mobility.  SNF has been recommended and will be a good choice for her post acute.  OT will continue to follow in the acute setting.    Follow Up Recommendations  SNF;Supervision/Assistance - 24 hour    Equipment Recommendations  3 in 1 bedside commode    Recommendations for Other Services      Precautions / Restrictions Precautions Precautions: Fall Precaution Comments: watch sats       Mobility Bed Mobility   Bed Mobility: Supine to Sit;Sit to Supine     Supine to sit: Min assist;HOB elevated Sit to supine: Mod assist      Transfers Overall transfer level: Needs assistance Equipment used: Rolling walker (2 wheeled) Transfers: Sit to/from [facility] Sit to Stand: Min assist Stand pivot transfers: Min assist            Balance Overall balance assessment: Needs assistance Sitting-balance support: Feet supported Sitting balance-Leahy Scale: Poor     Standing balance support: Bilateral upper extremity supported Standing balance-Leahy Scale: Poor Standing balance comment: RW with bil UE support in standing                           ADL either performed or assessed with clinical judgement   ADL                            Toilet Transfer: Minimal assistance;+2 for physical assistance   Toileting- Clothing Manipulation and Hygiene: Maximal assistance;Sit to/from stand;Cueing for safety                                                                            General Comments  VSS    Pertinent Vitals/ Pain       Pain Assessment: No/denies pain                                                          Frequency  Min 2X/week        Progress Toward Goals  OT Goals(current goals can now be found in the care plan section)     Acute Rehab OT Goals Patient Stated Goal: I want to move better OT Goal Formulation: With patient Time For  Goal Achievement: 05/22/20 Potential to Achieve Goals: [place] Discharge plan remains appropriate    Co-evaluation                 AM-PAC OT "6 Clicks" Daily Activity     Outcome Measure   Help from another person eating meals?: A Little Help from another person taking care of personal grooming?: A Little Help from another person toileting, which includes using toliet, bedpan, or urinal?: A Lot Help from another person bathing (including washing, rinsing, drying)?: A Lot Help from another person to put on and taking off regular upper body clothing?: A Lot Help from another person to put on and taking off regular lower body clothing?: A Lot 6 Click Score: 14    End of Session Equipment Utilized During Treatment: Gait belt;Rolling walker;Oxygen  OT Visit Diagnosis: Unsteadiness on feet (R26.81);Muscle weakness (generalized) (M62.81)   Activity Tolerance Patient limited by fatigue   Patient Left in bed;with call bell/phone within reach;with family/visitor present;with nursing/sitter in room   Nurse Communication Other (comment) (patient had BM)        Time: [id number] OT Time Calculation (min): 14 min  Charges: OT General Charges $OT Visit: 1 Visit OT Treatments $Self Care/Home  Management : 8-22 mins  05/11/2020  Rich, OTR/L  Acute Rehabilitation Services  Office:  [phone]    Metta Clines 05/11/2020, 3:49 PM

## 2020-05-11 NOTE — Progress Notes (Signed)
Pt transferred to 6E02 without incident. Report given to [place], Therapist, sports.

## 2020-05-12 LAB — CBC
HCT: 25.4 % — ABNORMAL LOW (ref 36.0–46.0)
Hemoglobin: 8.2 g/dL — ABNORMAL LOW (ref 12.0–15.0)
MCH: 30.8 pg (ref 26.0–34.0)
MCHC: 32.3 g/dL (ref 30.0–36.0)
MCV: 95.5 fL (ref 80.0–100.0)
Platelets: 113 10*3/uL — ABNORMAL LOW (ref 150–400)
RBC: 2.66 MIL/uL — ABNORMAL LOW (ref 3.87–5.11)
RDW: 17.8 % — ABNORMAL HIGH (ref 11.5–15.5)
WBC: 4.8 10*3/uL (ref 4.0–10.5)
nRBC: 0 % (ref 0.0–0.2)

## 2020-05-12 LAB — GLUCOSE, CAPILLARY
Glucose-Capillary: 114 mg/dL — ABNORMAL HIGH (ref 70–99)
Glucose-Capillary: 108 mg/dL — ABNORMAL HIGH (ref 70–99)
Glucose-Capillary: 121 mg/dL — ABNORMAL HIGH (ref 70–99)

## 2020-05-12 MED ORDER — FUROSEMIDE 40 MG PO TABS
40.0000 mg | ORAL_TABLET | Freq: Every day | ORAL | Status: DC
  Administered 2020-05-12 – 2020-05-16 (×4): 40 mg via ORAL
  Filled 2020-05-12 (×4): qty 1

## 2020-05-12 NOTE — TOC Progression Note (Addendum)
Transition of Care [facility]) - Progression Note    Patient Details  Name: Jasmine Hood MRN: [id number] Date of Birth: 01-02-1946  Transition of Care [facility]) CM/SW [place], [place] Phone Number: 05/12/2020, 1:05 PM  Clinical Narrative:    CSW went to pt's room to present bed offers, pt sleeping, CSW didn't want to wake pt up, left offers at bedside. CSW called pt's daughter to go over offers, pt's daughter states pt's son, [name], is POA and he will make choice. CSW reached out to [facility], had to leave a vm. Auth needs to be started, they are closed for the Christmas holiday.   Expected Discharge Plan: [place] Barriers to Discharge: Continued Medical Work up  Expected Discharge Plan and Services Expected Discharge Plan: [place] arrangements for the past 2 months: Single Family Home                                       Social Determinants of Health (SDOH) Interventions    Readmission Risk Interventions No flowsheet data found.

## 2020-05-12 NOTE — Progress Notes (Signed)
   05/12/20 0355  Assess: MEWS Score  Temp (!) 97.5 F (36.4 C)  BP (!) 157/54  ECG Heart Rate 84  Resp (!) 26  SpO2 94 %  O2 Device Nasal Cannula  Assess: MEWS Score  MEWS Temp 0  MEWS Systolic 0  MEWS Pulse 0  MEWS RR 2  MEWS LOC 0  MEWS Score 2  MEWS Score Color Yellow  Assess: if the MEWS score is Yellow or Red  Were vital signs taken at a resting state? Yes  Focused Assessment Change from prior assessment (see assessment flowsheet)  Early Detection of Sepsis Score *See Row Information* Low  MEWS guidelines implemented *See Row Information* Yes  Treat  MEWS Interventions Administered prn meds/treatments  Pain Scale 0-10  Pain Score 0  Patients Stated Pain Goal 0  Take Vital Signs  Increase Vital Sign Frequency  Yellow: Q 2hr X 2 then Q 4hr X 2, if remains yellow, continue Q 4hrs  Escalate  MEWS: Escalate Yellow: discuss with charge nurse/RN and consider discussing with provider and RRT  Notify: Charge Nurse/RN  Name of Charge Nurse/RN Notified Shanell, RN  Date Charge Nurse/RN Notified 05/12/20  Time Charge Nurse/RN Notified 0430

## 2020-05-12 NOTE — Progress Notes (Addendum)
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  Patient was treated with antibiotics and cultures grew out Klebsiella and possibly staph epidermis.  Course was complicated by stridor and a renal infarction requiring heparin which led to a right upper extremity hematoma.  Palliative care consulted.  Patient seen by physical and Occupational Therapy who are recommending skilled nursing.   Today, patient little tired, otherwise no complaints.  Hemoglobin dropped to 7.7, but trending back up today.  Patient complained of some choking this morning, but feels fine now other than being a little bit cold.   Assessment/Plan: Active Problems:   Hypothyroidism: Continue Synthroid.  Follow-up TSH as outpatient    MORBID OBESITY: Meets criteria with BMI greater than 40.    Essential hypertension: Not on any medications at home.  Started on Toprol a few days ago, initial blood pressure was stable but starting to trend back upward.  We will add some Lasix.    OSA (obstructive sleep apnea): Not on CPAP.    Transaminitis: Chronic, stable   AKI (acute kidney injury) ([place]): Causing ATN.  Secondary to severe sepsis.  Creatinine now normalized.  Now resolved.  Severe Klebsiella sepsis present on admission [facility]) which developed into septic shock within 24 hours: Secondary to acute UTI.  Patient met criteria for  severe sepsis and then septic shock given urinary infection plus lactic acidosis.  Treated aggressively with fluids and antibiotics.  No growth from urine culture.   Renal infarct [facility]): Was on IV heparin which was discontinued after developed hematoma.  Anemia: Chronic macrocytic anemia, likely from chronic disease-sleep apnea and hypertension.  Baseline looks to be around 10-11 with macrocytosis.  Suspect that she has acute blood loss anemia from her hematoma.  Has since stabilized and trending back upward.   Code Status: DNR  Family Communication: Updated son by phone  Disposition Plan: Skilled nursing, hopefully Monday   Consultants:  Palliative care  CCM   Procedures:  Preserved ejection fraction, no evidence of diastolic dysfunction, no significant valvular dysfunction  Antimicrobials:  IV vancomycin and Flagyl 12/17 only  IV cefepime 12/17-12/18  IV Rocephin 12/18-present  DVT prophylaxis: SCDs, previously on heparin   Objective: Vitals:   05/12/20 0732 05/12/20 1159  BP: (!) 155/52 (!) 147/99  Pulse: 93 100  Resp: (!) 28 (!) 26  Temp: 98.4 F (36.9 C) 97.9 F (36.6 C)  SpO2: 95% 95%    Intake/Output Summary (Last 24 hours) at 05/12/2020 1343 Last data filed at 05/12/2020 0358 Gross per 24 hour  Intake --  Output 750 ml  Net -750 ml   Filed Weights   05/08/20 0414 05/11/20 0500 05/12/20 0355  Weight: 101.8 kg 100 kg 100.8 kg   Body mass index is 44.88 kg/m.  Exam:   General: Alert and oriented x2, no acute distress, fatigued  HEENT: Normocephalic and atraumatic mucous membranes slightly dry, neck thick, narrow airway  Cardiovascular: Regular rate and rhythm,  S1-S2  Respiratory: Clear to auscultation bilaterally  Abdomen: Soft, obese, nontender, positive bowel sounds  Musculoskeletal: No clubbing or cyanosis, trace pitting edema  Skin: No skin breaks, tears or lesions  Psychiatry: Appropriate, no evidence of psychoses   Data  Reviewed: CBC: Recent Labs  Lab 05/05/20 1436 05/05/20 2338 05/07/20 0327 05/07/20 1112 05/07/20 1959 05/08/20 0159 05/09/20 0156 05/11/20 0126 05/12/20 0246  WBC 15.8*   < > 8.0  --   --  7.3 6.3 4.5 4.8  NEUTROABS 14.9*  --   --   --   --   --   --   --   --   HGB 9.3*   < > 7.4*   < > 8.3* 8.4* 8.2* 7.7* 8.2*  HCT 30.5*   < > 23.3*   < > 26.0* 26.8* 25.4* 25.8* 25.4*  MCV 103.0*   < > 99.1  --   --  95.4 95.1 99.2 95.5  PLT 130*   < > 68*  --   --  55* 60* 68* 113*   < > = values in this interval not displayed.   Basic Metabolic Panel: Recent Labs  Lab 05/06/20 0310 05/06/20 0918 05/07/20 0327 05/08/20 0159 05/09/20 0156 05/11/20 0126  NA 134*  --  128* 131* 133* 134*  K 4.0  --  4.0 4.1 4.2 4.8  CL 105  --  101 103 103 103  CO2 16*  --  18* 20* 22 23  GLUCOSE 162*  --  156* 120* 152* 91  BUN 15  --  20 23 16 11   CREATININE 0.88  --  0.69 0.63 0.56 0.55  CALCIUM 7.8*  --  7.0* 7.2* 7.2* 7.2*  MG  --  1.2* 2.1  --   --   --   PHOS  --  2.2* 4.2  --   --   --    GFR: Estimated Creatinine Clearance: 62.5 mL/min (by C-G formula based on SCr of 0.55 mg/dL). Liver Function Tests: Recent Labs  Lab 05/05/20 1436 05/09/20 0156 05/11/20 0126  AST 109* 58* 58*  ALT 37 38 34  ALKPHOS 110 133* 167*  BILITOT 1.2 0.9 0.6  PROT 6.8 5.9* 5.8*  ALBUMIN 2.1* 1.8* 1.8*   No results for input(s): LIPASE, AMYLASE in the last 168 hours. No results for input(s): AMMONIA in the last 168 hours. Coagulation Profile: Recent Labs  Lab 05/05/20 1525  INR 1.4*   Cardiac Enzymes: Recent Labs  Lab 05/05/20 1436  CKTOTAL 45   BNP (last 3 results) No results for input(s): PROBNP in the last 8760 hours. HbA1C: No results for input(s): HGBA1C in the last 72 hours. CBG: Recent Labs  Lab 05/11/20 1124 05/11/20 1638 05/11/20 2146 05/12/20 0821 05/12/20 1156  GLUCAP 142* 115* 111* 114* 108*   Lipid Profile: No results for input(s): CHOL, HDL, LDLCALC, TRIG, CHOLHDL,  LDLDIRECT in the last 72 hours. Thyroid Function Tests: No results for input(s): TSH, T4TOTAL, FREET4, T3FREE, THYROIDAB in the last 72 hours. Anemia Panel: No results for input(s): VITAMINB12, FOLATE, FERRITIN, TIBC, IRON, RETICCTPCT in the last 72 hours. Urine analysis:    Component Value Date/Time   COLORURINE YELLOW 05/05/2020 2305   APPEARANCEUR CLOUDY (A) 05/05/2020 2305   LABSPEC 1.045 (H) 05/05/2020 2305   PHURINE 5.0 05/05/2020 2305   GLUCOSEU NEGATIVE 05/05/2020 2305   GLUCOSEU NEGATIVE 01/25/2010 0858   HGBUR SMALL (A) 05/05/2020 2305   HGBUR 1+ 12/30/2008 0838   BILIRUBINUR NEGATIVE 05/05/2020  [place] 04/28/2015 1122   KETONESUR NEGATIVE 05/05/2020 2305   PROTEINUR NEGATIVE 05/05/2020 2305   UROBILINOGEN 0.2 04/28/2015 1122   UROBILINOGEN 0.2 01/25/2010 0858   NITRITE NEGATIVE 05/05/2020 2305   LEUKOCYTESUR LARGE (A) 05/05/2020 2305   Sepsis Labs: @LABRCNTIP (procalcitonin:4,lacticidven:4)  ) Recent Results (from the past 240 hour(s))  Blood culture (routine single)     Status: Abnormal   Collection Time: 05/05/20  3:43 PM   Specimen: BLOOD  Result Value Ref Range Status   Specimen Description BLOOD LEFT ANTECUBITAL  Final   Special Requests   Final    BOTTLES DRAWN AEROBIC AND ANAEROBIC Blood Culture adequate volume   Culture  Setup Time   Final    IN BOTH AEROBIC AND ANAEROBIC BOTTLES GRAM NEGATIVE RODS Organism ID to follow GRAM POSITIVE COCCI CRITICAL RESULT CALLED TO, READ BACK BY AND VERIFIED WITH: J. MILLEN PHARMD, AT 2951 05/06/20 BY D. VANHOOK    Culture (A)  Final    KLEBSIELLA PNEUMONIAE STAPHYLOCOCCUS EPIDERMIDIS THE SIGNIFICANCE OF ISOLATING THIS ORGANISM FROM A SINGLE SET OF BLOOD CULTURES WHEN MULTIPLE SETS ARE DRAWN IS UNCERTAIN. PLEASE NOTIFY THE MICROBIOLOGY DEPARTMENT WITHIN ONE WEEK IF SPECIATION AND SENSITIVITIES ARE REQUIRED. Performed at [facility], [place] [address], [place], [place] [address]    Report Status  05/08/2020 FINAL  Final   Organism ID, Bacteria KLEBSIELLA PNEUMONIAE  Final      Susceptibility   Klebsiella pneumoniae - MIC*    AMPICILLIN >=32 RESISTANT Resistant     CEFAZOLIN 8 SENSITIVE Sensitive     CEFEPIME <=0.12 SENSITIVE Sensitive     CEFTAZIDIME <=1 SENSITIVE Sensitive     CEFTRIAXONE <=0.25 SENSITIVE Sensitive     CIPROFLOXACIN <=0.25 SENSITIVE Sensitive     GENTAMICIN <=1 SENSITIVE Sensitive     IMIPENEM <=0.25 SENSITIVE Sensitive     TRIMETH/SULFA <=20 SENSITIVE Sensitive     AMPICILLIN/SULBACTAM >=32 RESISTANT Resistant     PIP/TAZO >=128 RESISTANT Resistant     Extended ESBL NOT DONE      * KLEBSIELLA PNEUMONIAE  Blood Culture ID Panel (Reflexed)     Status: Abnormal   Collection Time: 05/05/20  3:43 PM  Result Value Ref Range Status   Enterococcus faecalis NOT DETECTED NOT DETECTED Final   Enterococcus Faecium NOT DETECTED NOT DETECTED Final   Listeria monocytogenes NOT DETECTED NOT DETECTED Final   Staphylococcus species DETECTED (A) NOT DETECTED Final    Comment: CRITICAL RESULT CALLED TO, READ BACK BY AND VERIFIED WITH: J. MILLEN PHARMD, AT 6063 05/06/20 BY D. VANHOOK    Staphylococcus aureus (BCID) NOT DETECTED NOT DETECTED Final   Staphylococcus epidermidis DETECTED (A) NOT DETECTED Final    Comment: CRITICAL RESULT CALLED TO, READ BACK BY AND VERIFIED WITH: J. MILLEN PHARMD, AT 0160 05/06/20 BY D. VANHOOK    Staphylococcus lugdunensis NOT DETECTED NOT DETECTED Final   Streptococcus species NOT DETECTED NOT DETECTED Final   Streptococcus agalactiae NOT DETECTED NOT DETECTED Final   Streptococcus pneumoniae NOT DETECTED NOT DETECTED Final   Streptococcus pyogenes NOT DETECTED NOT DETECTED Final   A.calcoaceticus-baumannii NOT DETECTED NOT DETECTED Final   Bacteroides fragilis NOT DETECTED NOT DETECTED Final   Enterobacterales DETECTED (A) NOT DETECTED Final    Comment: Enterobacterales represent a large order of gram negative bacteria, not a single  organism. CRITICAL RESULT CALLED TO, READ BACK BY AND VERIFIED WITH: J. [place], AT 1093 05/06/20 BY D. VANHOOK    Enterobacter cloacae complex NOT  DETECTED NOT DETECTED Final   Escherichia coli NOT DETECTED NOT DETECTED Final   Klebsiella aerogenes NOT DETECTED NOT DETECTED Final   Klebsiella oxytoca NOT DETECTED NOT DETECTED Final   Klebsiella pneumoniae DETECTED (A) NOT DETECTED Final    Comment: CRITICAL RESULT CALLED TO, READ BACK BY AND VERIFIED WITH: J. MILLEN PHARMD, AT 6286 05/06/20 BY D. VANHOOK    Proteus species NOT DETECTED NOT DETECTED Final   Salmonella species NOT DETECTED NOT DETECTED Final   Serratia marcescens NOT DETECTED NOT DETECTED Final   Haemophilus influenzae NOT DETECTED NOT DETECTED Final   Neisseria meningitidis NOT DETECTED NOT DETECTED Final   Pseudomonas aeruginosa NOT DETECTED NOT DETECTED Final   Stenotrophomonas maltophilia NOT DETECTED NOT DETECTED Final   Candida albicans NOT DETECTED NOT DETECTED Final   Candida auris NOT DETECTED NOT DETECTED Final   Candida glabrata NOT DETECTED NOT DETECTED Final   Candida krusei NOT DETECTED NOT DETECTED Final   Candida parapsilosis NOT DETECTED NOT DETECTED Final   Candida tropicalis NOT DETECTED NOT DETECTED Final   Cryptococcus neoformans/gattii NOT DETECTED NOT DETECTED Final   CTX-M ESBL NOT DETECTED NOT DETECTED Final   Carbapenem resistance IMP NOT DETECTED NOT DETECTED Final   Carbapenem resistance KPC NOT DETECTED NOT DETECTED Final   Methicillin resistance mecA/C NOT DETECTED NOT DETECTED Final   Carbapenem resistance NDM NOT DETECTED NOT DETECTED Final   Carbapenem resist OXA 48 LIKE NOT DETECTED NOT DETECTED Final   Carbapenem resistance VIM NOT DETECTED NOT DETECTED Final    Comment: Performed at [facility], [address] [place], [place] [address]  MRSA PCR Screening     Status: None   Collection Time: 05/05/20  9:45 PM   Specimen: Nasal Mucosa; Nasopharyngeal  Result Value  Ref Range Status   MRSA by PCR NEGATIVE NEGATIVE Final    Comment:        The GeneXpert MRSA Assay (FDA approved for NASAL specimens only), is one component of a comprehensive MRSA colonization surveillance program. It is not intended to diagnose MRSA infection nor to guide or monitor treatment for MRSA infections. Performed at [facility], [place] [address] [place], [place] [address]   Urine culture     Status: None   Collection Time: 05/05/20 11:05 PM   Specimen: In/Out Cath Urine  Result Value Ref Range Status   Specimen Description IN/OUT CATH URINE  Final   Special Requests NONE  Final   Culture   Final    NO GROWTH Performed at [facility], [place] [address] [place], [place] [address]    Report Status 05/07/2020 FINAL  Final  Culture, blood (routine x 2)     Status: None   Collection Time: 05/05/20 11:14 PM   Specimen: BLOOD LEFT HAND  Result Value Ref Range Status   Specimen Description BLOOD LEFT HAND  Final   Special Requests   Final    BOTTLES DRAWN AEROBIC AND ANAEROBIC Blood Culture adequate volume   Culture   Final    NO GROWTH 5 DAYS Performed at [facility], [place] [address] [place], [place] [address]    Report Status 05/11/2020 FINAL  Final  Culture, blood (routine x 2)     Status: None   Collection Time: 05/06/20  3:10 AM   Specimen: BLOOD LEFT HAND  Result Value Ref Range Status   Specimen Description BLOOD LEFT HAND  Final   Special Requests   Final  BOTTLES DRAWN AEROBIC ONLY Blood Culture adequate volume   Culture   Final    NO GROWTH 5 DAYS Performed at [facility], [place] [address] [place], [place] [address]    Report Status 05/11/2020 FINAL  Final  Resp Panel by RT-PCR (Flu A&B, Covid) Nasopharyngeal Swab     Status: None   Collection Time: 05/08/20 12:59 PM   Specimen: Nasopharyngeal Swab; Nasopharyngeal(NP) swabs in vial transport medium  Result Value Ref Range Status   SARS Coronavirus 2 by RT PCR NEGATIVE  NEGATIVE Final    Comment: (NOTE) SARS-CoV-2 target nucleic acids are NOT DETECTED.  The SARS-CoV-2 RNA is generally detectable in upper respiratory specimens during the acute phase of infection. The lowest concentration of SARS-CoV-2 viral copies this assay can detect is 138 copies/mL. A negative result does not preclude SARS-Cov-2 infection and should not be used as the sole basis for treatment or other patient management decisions. A negative result may occur with  improper specimen collection/handling, submission of specimen other than nasopharyngeal swab, presence of viral mutation(s) within the areas targeted by this assay, and inadequate number of viral copies(<138 copies/mL). A negative result must be combined with clinical observations, patient history, and epidemiological information. The expected result is Negative.  Fact Sheet for Patients:  EntrepreneurPulse.com.au  Fact Sheet for Healthcare Providers:  IncredibleEmployment.be  This test is no t yet approved or cleared by the [place] FDA and  has been authorized for detection and/or diagnosis of SARS-CoV-2 by FDA under an Emergency Use Authorization (EUA). This EUA will remain  in effect (meaning this test can be used) for the duration of the COVID-19 declaration under Section 564(b)(1) of the Act, 21 U.S.C.section 360bbb-3(b)(1), unless the authorization is terminated  or revoked sooner.       Influenza A by PCR NEGATIVE NEGATIVE Final   Influenza B by PCR NEGATIVE NEGATIVE Final    Comment: (NOTE) The Xpert Xpress SARS-CoV-2/FLU/RSV plus assay is intended as an aid in the diagnosis of influenza from Nasopharyngeal swab specimens and should not be used as a sole basis for treatment. Nasal washings and aspirates are unacceptable for Xpert Xpress SARS-CoV-2/FLU/RSV testing.  Fact Sheet for Patients: EntrepreneurPulse.com.au  Fact Sheet for Healthcare  Providers: IncredibleEmployment.be  This test is not yet approved or cleared by the [place] FDA and has been authorized for detection and/or diagnosis of SARS-CoV-2 by FDA under an Emergency Use Authorization (EUA). This EUA will remain in effect (meaning this test can be used) for the duration of the COVID-19 declaration under Section 564(b)(1) of the Act, 21 U.S.C. section 360bbb-3(b)(1), unless the authorization is terminated or revoked.  Performed at [facility], [place] [address], [place], [place] [address]       Studies: No results found.  Scheduled Meds: . acetaminophen  650 mg Oral TID  . anastrozole  1 mg Oral Daily  . Chlorhexidine Gluconate Cloth  6 each Topical Q0600  . citalopram  10 mg Oral Daily  . heparin injection (subcutaneous)  5,000 Units Subcutaneous Q8H  . insulin aspart  1-3 Units Subcutaneous Q4H  . levothyroxine  25 mcg Oral Q0600  . lidocaine  1 patch Transdermal Q24H  . metoprolol tartrate  12.5 mg Oral BID  . pantoprazole  40 mg Oral Daily  . sulfamethoxazole-trimethoprim  2 tablet Oral Q12H    Continuous Infusions: . sodium chloride 10 mL/hr at 05/08/20 2000     LOS: 7 days     Labella Zahradnik K  [place] Pink, MD [facility]   05/12/2020, 1:43 PM

## 2020-05-13 DIAGNOSIS — A4159 Other Gram-negative sepsis: Secondary | ICD-10-CM | POA: Diagnosis present

## 2020-05-13 LAB — BASIC METABOLIC PANEL
Anion gap: 8 (ref 5–15)
BUN: 12 mg/dL (ref 8–23)
CO2: 26 mmol/L (ref 22–32)
Calcium: 8.1 mg/dL — ABNORMAL LOW (ref 8.9–10.3)
Chloride: 95 mmol/L — ABNORMAL LOW (ref 98–111)
Creatinine, Ser: 0.77 mg/dL (ref 0.44–1.00)
GFR, Estimated: >60 mL/min (ref >60)
Glucose, Bld: 99 mg/dL (ref 70–99)
Potassium: 5 mmol/L (ref 3.5–5.1)
Sodium: 129 mmol/L — ABNORMAL LOW (ref 135–145)

## 2020-05-13 LAB — CBC
HCT: 24.8 % — ABNORMAL LOW (ref 36.0–46.0)
Hemoglobin: 7.8 g/dL — ABNORMAL LOW (ref 12.0–15.0)
MCH: 29.7 pg (ref 26.0–34.0)
MCHC: 31.5 g/dL (ref 30.0–36.0)
MCV: 94.3 fL (ref 80.0–100.0)
Platelets: 125 10*3/uL — ABNORMAL LOW (ref 150–400)
RBC: 2.63 MIL/uL — ABNORMAL LOW (ref 3.87–5.11)
RDW: 18 % — ABNORMAL HIGH (ref 11.5–15.5)
WBC: 5 10*3/uL (ref 4.0–10.5)
nRBC: 0 % (ref 0.0–0.2)

## 2020-05-13 LAB — GLUCOSE, CAPILLARY
Glucose-Capillary: 97 mg/dL (ref 70–99)
Glucose-Capillary: 95 mg/dL (ref 70–99)
Glucose-Capillary: 94 mg/dL (ref 70–99)
Glucose-Capillary: 88 mg/dL (ref 70–99)
Glucose-Capillary: 75 mg/dL (ref 70–99)

## 2020-05-13 NOTE — Plan of Care (Signed)
  Problem: Clinical Measurements: Goal: Signs and symptoms of infection will decrease Outcome: Progressing   Problem: Respiratory: Goal: Ability to maintain adequate ventilation will improve Outcome: Progressing

## 2020-05-13 NOTE — Progress Notes (Signed)
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  Patient was treated with antibiotics and cultures grew out Klebsiella and possibly staph epidermis.  Course was complicated by stridor and a renal infarction requiring heparin which led to a right upper extremity hematoma.  Palliative care consulted.  Patient seen by physical and Occupational Therapy who are recommending skilled nursing.   Today, patient little tired, otherwise doing okay. Assessment/Plan: Active Problems:   Hypothyroidism: Continue Synthroid.  Follow-up TSH as outpatient    MORBID OBESITY: Meets criteria with BMI greater than 40.    Essential hypertension: Not on any medications at home.  Started on Toprol a few days ago, initial blood pressure was stable but starting to trend back upward.  We will add some Lasix.    OSA (obstructive sleep apnea): Not on CPAP.    Transaminitis: Chronic, stable   AKI (acute kidney injury) ([place]): Causing ATN.  Secondary to severe sepsis.  Creatinine now normalized.  Now resolved.  Severe Klebsiella sepsis present on admission [facility]) which developed into septic shock within 24 hours: Secondary to acute UTI.  Patient met criteria for severe sepsis and then septic shock given urinary infection plus lactic acidosis.  Treated aggressively with fluids and antibiotics.  No growth from urine culture.    Renal infarct [facility]): Was on IV heparin which was discontinued after developed hematoma.  Anemia: Chronic macrocytic anemia, likely from chronic disease-sleep apnea and hypertension.  Baseline looks to be around 10-11 with macrocytosis.  Suspect that she has acute blood loss anemia from her hematoma.  Has since stabilized and trending back upward.  Stable around 8.   Code Status: DNR  Family Communication: Left message for son  Disposition Plan: Skilled nursing, hopefully Monday   Consultants:  Palliative care  CCM   Procedures:  Preserved ejection fraction, no evidence of diastolic dysfunction, no significant valvular dysfunction  Antimicrobials:  IV vancomycin and Flagyl 12/17 only  IV cefepime 12/17-12/18  IV Rocephin 12/18-present  DVT prophylaxis: SCDs, previously on heparin   Objective: Vitals:   05/12/20 2151 05/13/20 0435  BP: (!) 126/53 135/65  Pulse: 72 82  Resp: (!) 23 (!) 25  Temp: 98 F (36.7 C) 98.1 F (36.7 C)  SpO2: 99% 97%    Intake/Output Summary (Last 24 hours) at 05/13/2020 1347 Last data filed at 05/13/2020 0438 Gross per 24 hour  Intake --  Output 1400 ml  Net -1400 ml   Filed Weights   05/11/20 0500 05/12/20 0355 05/13/20 0435  Weight: 100 kg 100.8 kg 95.4 kg   Body mass index is 42.48 kg/m.  Exam: No change from previous day  General: Alert and oriented x2, no acute distress, fatigued  HEENT: Normocephalic and atraumatic mucous membranes slightly dry, neck thick, narrow airway  Cardiovascular: Regular rate and rhythm, S1-S2  Respiratory: Clear to auscultation bilaterally  Abdomen: Soft, obese, nontender, positive bowel sounds  Musculoskeletal: No clubbing or cyanosis, trace pitting edema  Skin: No skin breaks, tears or lesions  Psychiatry: Appropriate, no evidence of psychoses   Data Reviewed: CBC: Recent Labs  Lab 05/08/20 0159 05/09/20 0156 05/11/20 0126 05/12/20 0246 05/13/20 0154  WBC 7.3 6.3 4.5 4.8  5.0  HGB 8.4* 8.2* 7.7* 8.2* 7.8*  HCT 26.8* 25.4* 25.8* 25.4* 24.8*  MCV 95.4 95.1 99.2 95.5 94.3  PLT 55* 60* 68* 113* 111*   Basic Metabolic Panel: Recent Labs  Lab 05/07/20 0327 05/08/20 0159 05/09/20 0156 05/11/20 0126 05/13/20 0154  NA 128* 131* 133* 134* 129*  K 4.0 4.1 4.2 4.8 5.0  CL 101 103 103 103 95*  CO2 18* 20* _0 GLUCOSE 156* 120* 152* 91 99  BUN _1 CREATININE 0.69 0.63 0.56 0.55 0.77  CALCIUM 7.0* 7.2* 7.2* 7.2* 8.1*  MG 2.1  --   --   --   --   PHOS 4.2  --   --   --   --    GFR: Estimated Creatinine Clearance: 60.5 mL/min (by C-G formula based on SCr of 0.77 mg/dL). Liver Function Tests: Recent Labs  Lab 05/09/20 0156 05/11/20 0126  AST 58* 58*  ALT 38 34  ALKPHOS 133* 167*  BILITOT 0.9 0.6  PROT 5.9* 5.8*  ALBUMIN 1.8* 1.8*   No results for input(s): LIPASE, AMYLASE in the last 168 hours. No results for input(s): AMMONIA in the last 168 hours. Coagulation Profile: No results for input(s): INR, PROTIME in the last 168 hours. Cardiac Enzymes: No results for input(s): CKTOTAL, CKMB, CKMBINDEX, TROPONINI in the last 168 hours. BNP (last 3 results) No results for input(s): PROBNP in the last 8760 hours. HbA1C: No results for input(s): HGBA1C in the last 72 hours. CBG: Recent Labs  Lab 05/12/20 0821 05/12/20 1156 05/12/20 1658 05/13/20 0753 05/13/20 1135  GLUCAP 114* 108* 121* 97 95   Lipid Profile: No results for input(s): CHOL, HDL, LDLCALC, TRIG, CHOLHDL, LDLDIRECT in the last 72 hours. Thyroid Function Tests: No results for input(s): TSH, T4TOTAL, FREET4, T3FREE, THYROIDAB in the last 72 hours. Anemia Panel: No results for input(s): VITAMINB12, FOLATE, FERRITIN, TIBC, IRON, RETICCTPCT in the last 72 hours. Urine analysis:    Component Value Date/Time   COLORURINE YELLOW 05/05/2020 2305   APPEARANCEUR CLOUDY (A) 05/05/2020 2305   LABSPEC 1.045 (H) 05/05/2020 2305   PHURINE 5.0 05/05/2020 2305   GLUCOSEU  NEGATIVE 05/05/2020 2305   GLUCOSEU NEGATIVE 01/25/2010 0858   HGBUR SMALL (A) 05/05/2020 2305   HGBUR 1+ 12/30/2008 0838   BILIRUBINUR NEGATIVE 05/05/2020 2305   BILIRUBINUR Neg 04/28/2015 1122   KETONESUR NEGATIVE 05/05/2020 2305   PROTEINUR NEGATIVE 05/05/2020 2305   UROBILINOGEN 0.2 04/28/2015 1122   UROBILINOGEN 0.2 01/25/2010 0858   NITRITE NEGATIVE 05/05/2020 2305   LEUKOCYTESUR LARGE (A) 05/05/2020 2305   Sepsis Labs: _2 (procalcitonin:4,lacticidven:4)  ) Recent Results (from the past 240 hour(s))  Blood culture (routine single)     Status: Abnormal   Collection Time: 05/05/20  3:43 PM   Specimen: BLOOD  Result Value Ref Range Status   Specimen Description BLOOD LEFT ANTECUBITAL  Final   Special Requests   Final    BOTTLES DRAWN AEROBIC AND ANAEROBIC Blood Culture adequate volume   Culture  Setup Time   Final    IN BOTH AEROBIC AND ANAEROBIC BOTTLES GRAM NEGATIVE RODS Organism ID to follow GRAM POSITIVE COCCI CRITICAL RESULT CALLED TO, READ BACK BY AND VERIFIED WITH: J. [place], AT [phone]  05/06/20 BY D. VANHOOK    Culture (A)  Final    KLEBSIELLA PNEUMONIAE STAPHYLOCOCCUS EPIDERMIDIS THE SIGNIFICANCE OF ISOLATING THIS ORGANISM FROM A SINGLE SET OF BLOOD CULTURES WHEN MULTIPLE SETS ARE DRAWN IS UNCERTAIN. PLEASE NOTIFY THE MICROBIOLOGY DEPARTMENT WITHIN ONE WEEK IF SPECIATION AND SENSITIVITIES ARE REQUIRED. Performed at [facility], [place] [address], [place], [place] [address]    Report Status 05/08/2020 FINAL  Final   Organism ID, Bacteria KLEBSIELLA PNEUMONIAE  Final      Susceptibility   Klebsiella pneumoniae - MIC*    AMPICILLIN >=32 RESISTANT Resistant     CEFAZOLIN 8 SENSITIVE Sensitive     CEFEPIME <=0.12 SENSITIVE Sensitive     CEFTAZIDIME <=1 SENSITIVE Sensitive     CEFTRIAXONE <=0.25 SENSITIVE Sensitive     CIPROFLOXACIN <=0.25 SENSITIVE Sensitive     GENTAMICIN <=1 SENSITIVE Sensitive     IMIPENEM <=0.25 SENSITIVE Sensitive      TRIMETH/SULFA <=20 SENSITIVE Sensitive     AMPICILLIN/SULBACTAM >=32 RESISTANT Resistant     PIP/TAZO >=128 RESISTANT Resistant     Extended ESBL NOT DONE      * KLEBSIELLA PNEUMONIAE  Blood Culture ID Panel (Reflexed)     Status: Abnormal   Collection Time: 05/05/20  3:43 PM  Result Value Ref Range Status   Enterococcus faecalis NOT DETECTED NOT DETECTED Final   Enterococcus Faecium NOT DETECTED NOT DETECTED Final   Listeria monocytogenes NOT DETECTED NOT DETECTED Final   Staphylococcus species DETECTED (A) NOT DETECTED Final    Comment: CRITICAL RESULT CALLED TO, READ BACK BY AND VERIFIED WITH: J. MILLEN PHARMD, AT [phone] 05/06/20 BY D. VANHOOK    Staphylococcus aureus (BCID) NOT DETECTED NOT DETECTED Final   Staphylococcus epidermidis DETECTED (A) NOT DETECTED Final    Comment: CRITICAL RESULT CALLED TO, READ BACK BY AND VERIFIED WITH: J. MILLEN PHARMD, AT [phone] 05/06/20 BY D. VANHOOK    Staphylococcus lugdunensis NOT DETECTED NOT DETECTED Final   Streptococcus species NOT DETECTED NOT DETECTED Final   Streptococcus agalactiae NOT DETECTED NOT DETECTED Final   Streptococcus pneumoniae NOT DETECTED NOT DETECTED Final   Streptococcus pyogenes NOT DETECTED NOT DETECTED Final   A.calcoaceticus-baumannii NOT DETECTED NOT DETECTED Final   Bacteroides fragilis NOT DETECTED NOT DETECTED Final   Enterobacterales DETECTED (A) NOT DETECTED Final    Comment: Enterobacterales represent a large order of gram negative bacteria, not a single organism. CRITICAL RESULT CALLED TO, READ BACK BY AND VERIFIED WITH: J. MILLEN PHARMD, AT [phone] 05/06/20 BY D. VANHOOK    Enterobacter cloacae complex NOT DETECTED NOT DETECTED Final   Escherichia coli NOT DETECTED NOT DETECTED Final   Klebsiella aerogenes NOT DETECTED NOT DETECTED Final   Klebsiella oxytoca NOT DETECTED NOT DETECTED Final   Klebsiella pneumoniae DETECTED (A) NOT DETECTED Final    Comment: CRITICAL RESULT CALLED TO, READ BACK BY AND VERIFIED  WITH: J. MILLEN PHARMD, AT [phone] 05/06/20 BY D. VANHOOK    Proteus species NOT DETECTED NOT DETECTED Final   Salmonella species NOT DETECTED NOT DETECTED Final   Serratia marcescens NOT DETECTED NOT DETECTED Final   Haemophilus influenzae NOT DETECTED NOT DETECTED Final   Neisseria meningitidis NOT DETECTED NOT DETECTED Final   Pseudomonas aeruginosa NOT DETECTED NOT DETECTED Final   Stenotrophomonas maltophilia NOT DETECTED NOT DETECTED Final   Candida albicans NOT DETECTED NOT DETECTED Final   Candida auris NOT DETECTED NOT DETECTED Final   Candida glabrata NOT DETECTED NOT DETECTED Final   Candida krusei  NOT DETECTED NOT DETECTED Final   Candida parapsilosis NOT DETECTED NOT DETECTED Final   Candida tropicalis NOT DETECTED NOT DETECTED Final   Cryptococcus neoformans/gattii NOT DETECTED NOT DETECTED Final   CTX-M ESBL NOT DETECTED NOT DETECTED Final   Carbapenem resistance IMP NOT DETECTED NOT DETECTED Final   Carbapenem resistance KPC NOT DETECTED NOT DETECTED Final   Methicillin resistance mecA/C NOT DETECTED NOT DETECTED Final   Carbapenem resistance NDM NOT DETECTED NOT DETECTED Final   Carbapenem resist OXA 48 LIKE NOT DETECTED NOT DETECTED Final   Carbapenem resistance VIM NOT DETECTED NOT DETECTED Final    Comment: Performed at [facility], [place] [address] [place], [place] [address]  MRSA PCR Screening     Status: None   Collection Time: 05/05/20  9:45 PM   Specimen: Nasal Mucosa; Nasopharyngeal  Result Value Ref Range Status   MRSA by PCR NEGATIVE NEGATIVE Final    Comment:        The GeneXpert MRSA Assay (FDA approved for NASAL specimens only), is one component of a comprehensive MRSA colonization surveillance program. It is not intended to diagnose MRSA infection nor to guide or monitor treatment for MRSA infections. Performed at [facility], [place] [address] [place], [place] [address]   Urine culture     Status: None   Collection Time: 05/05/20  11:05 PM   Specimen: In/Out Cath Urine  Result Value Ref Range Status   Specimen Description IN/OUT CATH URINE  Final   Special Requests NONE  Final   Culture   Final    NO GROWTH Performed at [facility], [place] [address], [place], [place] [address]    Report Status 05/07/2020 FINAL  Final  Culture, blood (routine x 2)     Status: None   Collection Time: 05/05/20 11:14 PM   Specimen: BLOOD LEFT HAND  Result Value Ref Range Status   Specimen Description BLOOD LEFT HAND  Final   Special Requests   Final    BOTTLES DRAWN AEROBIC AND ANAEROBIC Blood Culture adequate volume   Culture   Final    NO GROWTH 5 DAYS Performed at [facility], [place] [address], [place], [place] [address]    Report Status 05/11/2020 FINAL  Final  Culture, blood (routine x 2)     Status: None   Collection Time: 05/06/20  3:10 AM   Specimen: BLOOD LEFT HAND  Result Value Ref Range Status   Specimen Description BLOOD LEFT HAND  Final   Special Requests   Final    BOTTLES DRAWN AEROBIC ONLY Blood Culture adequate volume   Culture   Final    NO GROWTH 5 DAYS Performed at [facility], [place] [address] [place], [place] [address]    Report Status 05/11/2020 FINAL  Final  Resp Panel by RT-PCR (Flu A&B, Covid) Nasopharyngeal Swab     Status: None   Collection Time: 05/08/20 12:59 PM   Specimen: Nasopharyngeal Swab; Nasopharyngeal(NP) swabs in vial transport medium  Result Value Ref Range Status   SARS Coronavirus 2 by RT PCR NEGATIVE NEGATIVE Final    Comment: (NOTE) SARS-CoV-2 target nucleic acids are NOT DETECTED.  The SARS-CoV-2 RNA is generally detectable in upper respiratory specimens during the acute phase of infection. The lowest concentration of SARS-CoV-2 viral copies this assay can detect is 138 copies/mL. A negative result does not preclude SARS-Cov-2 infection and should not be used as the sole basis for treatment or  other patient management decisions. A negative result may occur  with  improper specimen collection/handling, submission of specimen other than nasopharyngeal swab, presence of viral mutation(s) within the areas targeted by this assay, and inadequate number of viral copies(<138 copies/mL). A negative result must be combined with clinical observations, patient history, and epidemiological information. The expected result is Negative.  Fact Sheet for Patients:  EntrepreneurPulse.com.au  Fact Sheet for Healthcare Providers:  IncredibleEmployment.be  This test is no t yet approved or cleared by the [place] FDA and  has been authorized for detection and/or diagnosis of SARS-CoV-2 by FDA under an Emergency Use Authorization (EUA). This EUA will remain  in effect (meaning this test can be used) for the duration of the COVID-19 declaration under Section 564(b)(1) of the Act, 21 U.S.C.section 360bbb-3(b)(1), unless the authorization is terminated  or revoked sooner.       Influenza A by PCR NEGATIVE NEGATIVE Final   Influenza B by PCR NEGATIVE NEGATIVE Final    Comment: (NOTE) The Xpert Xpress SARS-CoV-2/FLU/RSV plus assay is intended as an aid in the diagnosis of influenza from Nasopharyngeal swab specimens and should not be used as a sole basis for treatment. Nasal washings and aspirates are unacceptable for Xpert Xpress SARS-CoV-2/FLU/RSV testing.  Fact Sheet for Patients: EntrepreneurPulse.com.au  Fact Sheet for Healthcare Providers: IncredibleEmployment.be  This test is not yet approved or cleared by the [place] FDA and has been authorized for detection and/or diagnosis of SARS-CoV-2 by FDA under an Emergency Use Authorization (EUA). This EUA will remain in effect (meaning this test can be used) for the duration of the COVID-19 declaration under Section 564(b)(1) of the Act, 21 U.S.C. section 360bbb-3(b)(1), unless the authorization is terminated  or revoked.  Performed at [facility], [place] [address] [place],  [address]       Studies: No results found.  Scheduled Meds: . acetaminophen  650 mg Oral TID  . anastrozole  1 mg Oral Daily  . Chlorhexidine Gluconate Cloth  6 each Topical Q0600  . citalopram  10 mg Oral Daily  . furosemide  40 mg Oral Daily  . heparin injection (subcutaneous)  5,000 Units Subcutaneous Q8H  . insulin aspart  1-3 Units Subcutaneous Q4H  . levothyroxine  25 mcg Oral Q0600  . lidocaine  1 patch Transdermal Q24H  . metoprolol tartrate  12.5 mg Oral BID  . pantoprazole  40 mg Oral Daily  . sulfamethoxazole-trimethoprim  2 tablet Oral Q12H    Continuous Infusions: . sodium chloride 10 mL/hr at 05/08/20 2000     LOS: 8 days     Annita Brod, MD [facility]   05/13/2020, 1:47 PM

## 2020-05-14 LAB — GLUCOSE, CAPILLARY
Glucose-Capillary: 74 mg/dL (ref 70–99)
Glucose-Capillary: 83 mg/dL (ref 70–99)
Glucose-Capillary: 84 mg/dL (ref 70–99)
Glucose-Capillary: 89 mg/dL (ref 70–99)

## 2020-05-14 LAB — CBC
HCT: 24 % — ABNORMAL LOW (ref 36.0–46.0)
Hemoglobin: 7.8 g/dL — ABNORMAL LOW (ref 12.0–15.0)
MCH: 30.5 pg (ref 26.0–34.0)
MCHC: 32.5 g/dL (ref 30.0–36.0)
MCV: 93.8 fL (ref 80.0–100.0)
Platelets: 131 10*3/uL — ABNORMAL LOW (ref 150–400)
RBC: 2.56 MIL/uL — ABNORMAL LOW (ref 3.87–5.11)
RDW: 18.6 % — ABNORMAL HIGH (ref 11.5–15.5)
WBC: 5.3 10*3/uL (ref 4.0–10.5)
nRBC: 0.4 % — ABNORMAL HIGH (ref 0.0–0.2)

## 2020-05-14 MED ORDER — MORPHINE SULFATE 10 MG/5ML PO SOLN: 2.5000 mg | ORAL | Status: DC | PRN

## 2020-05-14 MED ORDER — LORAZEPAM 1 MG PO TABS
1.0000 mg | ORAL_TABLET | ORAL | Status: DC | PRN
  Administered 2020-05-14: 1 mg via ORAL
  Filled 2020-05-14: qty 1

## 2020-05-14 NOTE — Progress Notes (Signed)
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  Patient was treated with antibiotics and cultures grew out Klebsiella and possibly staph epidermis.  Course was complicated by stridor and a renal infarction requiring heparin which led to a right upper extremity hematoma.  Palliative care consulted.  Patient seen by physical and Occupational Therapy who were recommending skilled nursing.   Over the past few days, patient has eaten very little and when nursing discussed this with her today, patient stated that she just wants to go home.  Palliative care has been consulted and had extensive discussion with patient as well as her daughter and it has now been decided that patient will be comfort care with plans to be discharged home with hospice in the next 24 hours. Assessment/Plan: Active Problems:   Hypothyroidism: Was on Synthroid.  Now comfort care    MORBID OBESITY: Meets criteria with BMI greater than 40.    Essential hypertension: Not on any medications at home.  Started on Toprol a few days ago, initial blood pressure was stable but starting to trend back upward.  We will add some Lasix.  Continue Lasix, limit vitals to just once a day    OSA (obstructive sleep apnea): Not on CPAP.    Transaminitis: Chronic, stable   AKI (acute kidney injury) ([place]): Causing ATN.  Secondary to severe sepsis.   Creatinine now normalized.  Now resolved.  Severe Klebsiella sepsis present on admission [facility]) which developed into septic shock within 24 hours: Secondary to acute UTI.  Patient met criteria for severe sepsis and then septic shock given urinary infection plus lactic acidosis.  Treated aggressively with fluids and antibiotics.  No growth from urine culture.  No longer on antibiotics or fluids.   Renal infarct [facility]): Was on IV heparin which was discontinued after developed hematoma.  Anemia: Chronic macrocytic anemia, likely from chronic disease-sleep apnea and hypertension.  Baseline looks to be around 10-11 with macrocytosis.  Suspect that she has acute blood loss anemia from her hematoma.  Has since stabilized and trending back upward.  Stable around 8.  Now that she is comfort care we will not check any further labs.   Code Status: DNR  Family Communication: Discussed with family  Disposition Plan: Home with hospice tomorrow   Consultants:  Palliative care  CCM   Procedures:  Preserved ejection fraction, no evidence of diastolic dysfunction, no significant valvular dysfunction  Antimicrobials:  IV vancomycin and Flagyl 12/17 only  IV cefepime 12/17-12/18  IV Rocephin 12/18-12/21  DVT prophylaxis: SCDs, previously on heparin   Objective: Vitals:   05/14/20 0416 05/14/20 1245  BP: (!) 144/52 (!) 131/49  Pulse: 84 76  Resp: (!) 26 (!) 24  Temp: 97.7 F (36.5 C) 99.1 F (37.3 C)  SpO2: 91% (!) 88%    Intake/Output Summary (Last 24 hours) at 05/14/2020 1451 Last data filed at 05/14/2020 1200 Gross per 24 hour  Intake 120 ml  Output 400 ml  Net -280 ml   Filed Weights   05/12/20 0355 05/13/20 0435 05/14/20 0600  Weight: 100.8 kg 95.4 kg 94.2 kg   Body mass index is 41.94 kg/m.  Exam:   General: Oriented x2, fatigued  HEENT: Normocephalic and atraumatic mucous membranes slightly dry, neck thick, narrow airway  Cardiovascular: Regular rate and  rhythm, S1-S2  Respiratory: Clear to auscultation bilaterally  Abdomen: Soft, obese, nontender, positive bowel sounds  Musculoskeletal: No clubbing or cyanosis, trace pitting edema  Skin: No skin breaks, tears or lesions  Psychiatry: Appropriate, no evidence of psychoses   Data Reviewed: CBC: Recent Labs  Lab 05/09/20 0156 05/11/20 0126 05/12/20 0246 05/13/20 0154 05/14/20 0450  WBC 6.3 4.5 4.8 5.0 5.3  HGB 8.2* 7.7* 8.2* 7.8* 7.8*  HCT 25.4* 25.8* 25.4* 24.8* 24.0*  MCV 95.1 99.2 95.5 94.3 93.8  PLT 60* 68* 113* 125* 034*   Basic Metabolic Panel: Recent Labs  Lab 05/08/20 0159 05/09/20 0156 05/11/20 0126 05/13/20 0154  NA 131* 133* 134* 129*  K 4.1 4.2 4.8 5.0  CL 103 103 103 95*  CO2 20* 22 23 26   GLUCOSE 120* 152* 91 99  BUN 23 16 11 12   CREATININE 0.63 0.56 0.55 0.77  CALCIUM 7.2* 7.2* 7.2* 8.1*   GFR: Estimated Creatinine Clearance: 60.1 mL/min (by C-G formula based on SCr of 0.77 mg/dL). Liver Function Tests: Recent Labs  Lab 05/09/20 0156 05/11/20 0126  AST 58* 58*  ALT 38 34  ALKPHOS 133* 167*  BILITOT 0.9 0.6  PROT 5.9* 5.8*  ALBUMIN 1.8* 1.8*   No results for input(s): LIPASE, AMYLASE in the last 168 hours. No results for input(s): AMMONIA in the last 168 hours. Coagulation Profile: No results for input(s): INR, PROTIME in the last 168 hours. Cardiac Enzymes: No results for input(s): CKTOTAL, CKMB, CKMBINDEX, TROPONINI in the last 168 hours. BNP (last 3 results) No results for input(s): PROBNP in the last 8760 hours. HbA1C: No results for input(s): HGBA1C in the last 72 hours. CBG: Recent Labs  Lab 05/13/20 2015 05/13/20 2327 05/14/20 0359 05/14/20 0747 05/14/20 1145  GLUCAP 88 75 74 83 84   Lipid Profile: No results for input(s): CHOL, HDL, LDLCALC, TRIG, CHOLHDL, LDLDIRECT in the last 72 hours. Thyroid Function Tests: No results for input(s): TSH, T4TOTAL, FREET4, T3FREE, THYROIDAB in the last 72 hours. Anemia Panel: No  results for input(s): VITAMINB12, FOLATE, FERRITIN, TIBC, IRON, RETICCTPCT in the last 72 hours. Urine analysis:    Component Value Date/Time   COLORURINE YELLOW 05/05/2020 2305   APPEARANCEUR CLOUDY (A) 05/05/2020 2305   LABSPEC 1.045 (H) 05/05/2020 2305   PHURINE 5.0 05/05/2020 2305   GLUCOSEU NEGATIVE 05/05/2020 2305   GLUCOSEU NEGATIVE 01/25/2010 0858   HGBUR SMALL (A) 05/05/2020 2305   HGBUR 1+ 12/30/2008 0838   BILIRUBINUR NEGATIVE 05/05/2020 2305   BILIRUBINUR Neg 04/28/2015 1122   KETONESUR NEGATIVE 05/05/2020 2305   PROTEINUR NEGATIVE 05/05/2020 2305   UROBILINOGEN 0.2 04/28/2015 1122   UROBILINOGEN 0.2 01/25/2010 0858   NITRITE NEGATIVE 05/05/2020 2305   LEUKOCYTESUR LARGE (A) 05/05/2020 2305   Sepsis Labs: @LABRCNTIP (procalcitonin:4,lacticidven:4)  ) Recent Results (from the past 240 hour(s))  Blood culture (routine single)     Status: Abnormal   Collection Time: 05/05/20  3:43 PM   Specimen: BLOOD  Result Value Ref Range Status   Specimen Description BLOOD LEFT ANTECUBITAL  Final   Special Requests   Final    BOTTLES DRAWN AEROBIC AND ANAEROBIC  Blood Culture adequate volume   Culture  Setup Time   Final    IN BOTH AEROBIC AND ANAEROBIC BOTTLES GRAM NEGATIVE RODS Organism ID to follow GRAM POSITIVE COCCI CRITICAL RESULT CALLED TO, READ BACK BY AND VERIFIED WITH: J. MILLEN PHARMD, AT 5300 05/06/20 BY D. VANHOOK    Culture (A)  Final    KLEBSIELLA PNEUMONIAE STAPHYLOCOCCUS EPIDERMIDIS THE SIGNIFICANCE OF ISOLATING THIS ORGANISM FROM A SINGLE SET OF BLOOD CULTURES WHEN MULTIPLE SETS ARE DRAWN IS UNCERTAIN. PLEASE NOTIFY THE MICROBIOLOGY DEPARTMENT WITHIN ONE WEEK IF SPECIATION AND SENSITIVITIES ARE REQUIRED. Performed at [facility], [place] [address] [place], [place] [address]    Report Status 05/08/2020 FINAL  Final   Organism ID, Bacteria KLEBSIELLA PNEUMONIAE  Final      Susceptibility   Klebsiella pneumoniae - MIC*    AMPICILLIN >=32 RESISTANT  Resistant     CEFAZOLIN 8 SENSITIVE Sensitive     CEFEPIME <=0.12 SENSITIVE Sensitive     CEFTAZIDIME <=1 SENSITIVE Sensitive     CEFTRIAXONE <=0.25 SENSITIVE Sensitive     CIPROFLOXACIN <=0.25 SENSITIVE Sensitive     GENTAMICIN <=1 SENSITIVE Sensitive     IMIPENEM <=0.25 SENSITIVE Sensitive     TRIMETH/SULFA <=20 SENSITIVE Sensitive     AMPICILLIN/SULBACTAM >=32 RESISTANT Resistant     PIP/TAZO >=128 RESISTANT Resistant     Extended ESBL NOT DONE      * KLEBSIELLA PNEUMONIAE  Blood Culture ID Panel (Reflexed)     Status: Abnormal   Collection Time: 05/05/20  3:43 PM  Result Value Ref Range Status   Enterococcus faecalis NOT DETECTED NOT DETECTED Final   Enterococcus Faecium NOT DETECTED NOT DETECTED Final   Listeria monocytogenes NOT DETECTED NOT DETECTED Final   Staphylococcus species DETECTED (A) NOT DETECTED Final    Comment: CRITICAL RESULT CALLED TO, READ BACK BY AND VERIFIED WITH: J. MILLEN PHARMD, AT 1117 05/06/20 BY D. VANHOOK    Staphylococcus aureus (BCID) NOT DETECTED NOT DETECTED Final   Staphylococcus epidermidis DETECTED (A) NOT DETECTED Final    Comment: CRITICAL RESULT CALLED TO, READ BACK BY AND VERIFIED WITH: J. MILLEN PHARMD, AT 3567 05/06/20 BY D. VANHOOK    Staphylococcus lugdunensis NOT DETECTED NOT DETECTED Final   Streptococcus species NOT DETECTED NOT DETECTED Final   Streptococcus agalactiae NOT DETECTED NOT DETECTED Final   Streptococcus pneumoniae NOT DETECTED NOT DETECTED Final   Streptococcus pyogenes NOT DETECTED NOT DETECTED Final   A.calcoaceticus-baumannii NOT DETECTED NOT DETECTED Final   Bacteroides fragilis NOT DETECTED NOT DETECTED Final   Enterobacterales DETECTED (A) NOT DETECTED Final    Comment: Enterobacterales represent a large order of gram negative bacteria, not a single organism. CRITICAL RESULT CALLED TO, READ BACK BY AND VERIFIED WITH: J. MILLEN PHARMD, AT 0141 05/06/20 BY D. VANHOOK    Enterobacter cloacae complex NOT DETECTED  NOT DETECTED Final   Escherichia coli NOT DETECTED NOT DETECTED Final   Klebsiella aerogenes NOT DETECTED NOT DETECTED Final   Klebsiella oxytoca NOT DETECTED NOT DETECTED Final   Klebsiella pneumoniae DETECTED (A) NOT DETECTED Final    Comment: CRITICAL RESULT CALLED TO, READ BACK BY AND VERIFIED WITH: J. MILLEN PHARMD, AT 0301 05/06/20 BY D. VANHOOK    Proteus species NOT DETECTED NOT DETECTED Final   Salmonella species NOT DETECTED NOT DETECTED Final   Serratia marcescens NOT DETECTED NOT DETECTED Final   Haemophilus influenzae NOT DETECTED NOT DETECTED Final   Neisseria meningitidis NOT DETECTED NOT DETECTED Final  Pseudomonas aeruginosa NOT DETECTED NOT DETECTED Final   Stenotrophomonas maltophilia NOT DETECTED NOT DETECTED Final   Candida albicans NOT DETECTED NOT DETECTED Final   Candida auris NOT DETECTED NOT DETECTED Final   Candida glabrata NOT DETECTED NOT DETECTED Final   Candida krusei NOT DETECTED NOT DETECTED Final   Candida parapsilosis NOT DETECTED NOT DETECTED Final   Candida tropicalis NOT DETECTED NOT DETECTED Final   Cryptococcus neoformans/gattii NOT DETECTED NOT DETECTED Final   CTX-M ESBL NOT DETECTED NOT DETECTED Final   Carbapenem resistance IMP NOT DETECTED NOT DETECTED Final   Carbapenem resistance KPC NOT DETECTED NOT DETECTED Final   Methicillin resistance mecA/C NOT DETECTED NOT DETECTED Final   Carbapenem resistance NDM NOT DETECTED NOT DETECTED Final   Carbapenem resist OXA 48 LIKE NOT DETECTED NOT DETECTED Final   Carbapenem resistance VIM NOT DETECTED NOT DETECTED Final    Comment: Performed at [facility], [address], [place], [place] [address]  MRSA PCR Screening     Status: None   Collection Time: 05/05/20  9:45 PM   Specimen: Nasal Mucosa; Nasopharyngeal  Result Value Ref Range Status   MRSA by PCR NEGATIVE NEGATIVE Final    Comment:        The GeneXpert MRSA Assay (FDA approved for NASAL specimens only), is one component of  a comprehensive MRSA colonization surveillance program. It is not intended to diagnose MRSA infection nor to guide or monitor treatment for MRSA infections. Performed at [facility], [place] [address], [place], [place] [address]   Urine culture     Status: None   Collection Time: 05/05/20 11:05 PM   Specimen: In/Out Cath Urine  Result Value Ref Range Status   Specimen Description IN/OUT CATH URINE  Final   Special Requests NONE  Final   Culture   Final    NO GROWTH Performed at [facility], [place] [address] [place], [place] [address]    Report Status 05/07/2020 FINAL  Final  Culture, blood (routine x 2)     Status: None   Collection Time: 05/05/20 11:14 PM   Specimen: BLOOD LEFT HAND  Result Value Ref Range Status   Specimen Description BLOOD LEFT HAND  Final   Special Requests   Final    BOTTLES DRAWN AEROBIC AND ANAEROBIC Blood Culture adequate volume   Culture   Final    NO GROWTH 5 DAYS Performed at [facility], [place] [address], [place], [place] [address]    Report Status 05/11/2020 FINAL  Final  Culture, blood (routine x 2)     Status: None   Collection Time: 05/06/20  3:10 AM   Specimen: BLOOD LEFT HAND  Result Value Ref Range Status   Specimen Description BLOOD LEFT HAND  Final   Special Requests   Final    BOTTLES DRAWN AEROBIC ONLY Blood Culture adequate volume   Culture   Final    NO GROWTH 5 DAYS Performed at [facility], [place] [address] [place], [place] [address]    Report Status 05/11/2020 FINAL  Final  Resp Panel by RT-PCR (Flu A&B, Covid) Nasopharyngeal Swab     Status: None   Collection Time: 05/08/20 12:59 PM   Specimen: Nasopharyngeal Swab; Nasopharyngeal(NP) swabs in vial transport medium  Result Value Ref Range Status   SARS Coronavirus 2 by RT PCR NEGATIVE NEGATIVE Final    Comment: (NOTE) SARS-CoV-2 target nucleic acids are NOT DETECTED.  The SARS-CoV-2 RNA  is generally detectable in upper respiratory specimens during  the acute phase of infection. The lowest concentration of SARS-CoV-2 viral copies this assay can detect is 138 copies/mL. A negative result does not preclude SARS-Cov-2 infection and should not be used as the sole basis for treatment or other patient management decisions. A negative result may occur with  improper specimen collection/handling, submission of specimen other than nasopharyngeal swab, presence of viral mutation(s) within the areas targeted by this assay, and inadequate number of viral copies(<138 copies/mL). A negative result must be combined with clinical observations, patient history, and epidemiological information. The expected result is Negative.  Fact Sheet for Patients:  EntrepreneurPulse.com.au  Fact Sheet for Healthcare Providers:  IncredibleEmployment.be  This test is no t yet approved or cleared by the [place] FDA and  has been authorized for detection and/or diagnosis of SARS-CoV-2 by FDA under an Emergency Use Authorization (EUA). This EUA will remain  in effect (meaning this test can be used) for the duration of the COVID-19 declaration under Section 564(b)(1) of the Act, 21 U.S.C.section 360bbb-3(b)(1), unless the authorization is terminated  or revoked sooner.       Influenza A by PCR NEGATIVE NEGATIVE Final   Influenza B by PCR NEGATIVE NEGATIVE Final    Comment: (NOTE) The Xpert Xpress SARS-CoV-2/FLU/RSV plus assay is intended as an aid in the diagnosis of influenza from Nasopharyngeal swab specimens and should not be used as a sole basis for treatment. Nasal washings and aspirates are unacceptable for Xpert Xpress SARS-CoV-2/FLU/RSV testing.  Fact Sheet for Patients: EntrepreneurPulse.com.au  Fact Sheet for Healthcare Providers: IncredibleEmployment.be  This test is not yet approved or cleared by the [place] FDA and has been authorized for detection and/or  diagnosis of SARS-CoV-2 by FDA under an Emergency Use Authorization (EUA). This EUA will remain in effect (meaning this test can be used) for the duration of the COVID-19 declaration under Section 564(b)(1) of the Act, 21 U.S.C. section 360bbb-3(b)(1), unless the authorization is terminated or revoked.  Performed at [facility], [place] [address] [place], [place] [address]       Studies: No results found.  Scheduled Meds: . acetaminophen  650 mg Oral TID  . anastrozole  1 mg Oral Daily  . citalopram  10 mg Oral Daily  . furosemide  40 mg Oral Daily  . lidocaine  1 patch Transdermal Q24H    Continuous Infusions:    LOS: 9 days     Annita Brod, MD [facility]   05/14/2020, 2:51 PM

## 2020-05-14 NOTE — TOC Progression Note (Signed)
Transition of Care [facility]) - Progression Note    Patient Details  Name: Jasmine Hood MRN: [id number] Date of Birth: 06/13/1945  Transition of Care [facility]) CM/SW Contact  Konrad Penta, RN Phone Number: [phone] 05/14/2020, 2:17 PM  Clinical Narrative:   Received notification from [place] NP that patient's transition plan is for home with hospice. Writer to offer choice. Spoke with patient's nurse who endorses son Scharlene Catalina is HCPOA should be contacted [phone] to discuss further.   Spoke with [name] via phone. Confirms transition plan is for home with hospice. Hospice choice offered. [name] chose [facility]. Referral made to [facility].   Telephone call made to daughter Wynne Rozak [phone] to make aware that son chose [facility] for hospice services and that she may be contacted as well for further arrangements.   Belenda Cruise states she is trying to get patient's room ready for when she comes home. Belenda Cruise lives with patient. Belenda Cruise states patient has hospital bed at home thru [facility] States they have been waiting for a new mattress for months. Message sent to [facility] with [facility] to make aware.    Belenda Cruise states patient will likely need hoyer lift.   Patient already has wheelchair, bedside commode, transport chair, hospital bed.   Melissa with [facility] aware of home hospice referral.      Barriers to Discharge: Continued Medical Work up  Expected Discharge Plan and [place] with hospice- [facility]       Living arrangements for the past 2 months: Single Family Home                                       Social Determinants of Health (SDOH) Interventions    Readmission Risk Interventions No flowsheet data found.      Marthenia Rolling, MSN, RN,BSN Inpatient [facility] Case Manager [phone]

## 2020-05-14 NOTE — TOC Progression Note (Signed)
Transition of Care [facility]) - Progression Note    Patient Details  Name: Jasmine Hood MRN: [id number] Date of Birth: [birth year]  Transition of Care [facility]) CM/SW [place], [place] Phone Number: [phone] 05/14/2020, 9:39 AM  Clinical Narrative:     CSW attempted to reach patient's son [name] however had to leave voicemail.  CSW start insurance authorization reference # [id number] started on 05/15/20. Facility name will need to be given to [facility] once chosen.  TOC team will continue to assist with discharge planning needs.   Expected Discharge Plan: [place] Barriers to Discharge: Continued Medical Work up  Expected Discharge Plan and Services Expected Discharge Plan: [place] arrangements for the past 2 months: Single Family Home                                       Social Determinants of Health (SDOH) Interventions    Readmission Risk Interventions No flowsheet data found.

## 2020-05-14 NOTE — Plan of Care (Signed)
  Problem: Coping: Goal: Level of anxiety will decrease Outcome: Progressing   Problem: Pain Managment: Goal: General experience of comfort will improve Outcome: Progressing   Problem: Safety: Goal: Ability to remain free from injury will improve Outcome: Progressing   

## 2020-05-14 NOTE — Progress Notes (Addendum)
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 Admitted 12/17 after a fall. She had apparently been experiencing some increased tremor, some slurred speech preceding the fall. Hypotensive in the emergency department and found to have Klebsiella and staph epi bacteremia, significant lactic acidosis with a probable superimposed nongap acidosis. Volume resuscitation initiated but she had a period of tachypnea, respiratory distress and this prompted Lasix x1 overnight. CT scan of her chest did not show any evidence pulmonary embolism, patchy peribronchovascular peripheral reticulation and groundglass, question infiltrate versus edema versus atelectasis. CT abdomen with a segmental perfusion defect in the anterior mid to lower right kidney suggestive of a possible acute renal infarct. Her code status was confirmed as DNR, no CVC, no pressors.  Palliative care was asked to get involved to aid in Jasmine Hood conversations.   Today's Discussion (05/14/2020): Chart review. Per discussion with patients bedside RN, Jasmine Hood has not eating sufficiently in roughly four days.  Patient evaluated. She shares that she would like to go home. I asked her why she had not been eating. She shares that she no longer feels hungry. We called her son, Jasmine Hood on speakerphone and her daughter, Jasmine Hood came to bedside during our conversation. We discussed that in the setting of Jasmine Hood chronic comorbid conditions inclusive of acute renal failure, metastatic breast cancer, severe obesity, and upper airway syndrome that she overall is declining.  We discussed [place] prior COVID-19 infection. 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 We discussed that this is a  trend we have seen in many of our geriatric patients and has accounted for significance in rapid decline.   I expressed that [name] is sharing she would like to go home and understands that her time is now short. She shares that her personal goals right now include getting home to spending time with her family. Jasmine Hood states that her understands her outlook is poor and that her situation is now terminal. He expresses that he has always promised his mother he would allow her to be in her home to pass. [name], and Jasmine Hood all agree that she should go home with hospice care as this is what she wants.   We discussed in the meanwhile transitioning to comfort care in house and what that would entail inclusive of medications to control pain, dyspnea, agitation, nausea, itching, and hiccups.   We discussed stopping all uneccessary measures such as blood draws, needle sticks, and frequent vital signs. All family members expressed understanding and agreement with this plan.   Discussed the importance of continued conversation with family and their  medical providers regarding overall plan of care and treatment options, ensuring decisions are within the context of the patients values and GOCs.  Questions and concerns addressed   Objective Assessment: Vital Signs Vitals:   05/14/20 0416 05/14/20 1245  BP: (!) 144/52 (!) 131/49  Pulse: 84 76  Resp: (!) 26 (!) 24  Temp: 97.7 F (36.5 C) 99.1 F (37.3 C)  SpO2: 91% (!) 88%    Intake/Output Summary (Last 24 hours) at 05/14/2020 1317 Last data filed at 05/14/2020 1200 Gross per 24 hour  Intake 120 ml  Output 400 ml  Net -280 ml   Last Weight  Most recent update:  05/14/2020  6:34 AM   Weight  94.2 kg (207 lb 10.8 oz)           Gen: Elderly caucasian F in NAD HEENT: moist mucous membranes CV: Regular rate and rhythm  PULM: 3LPM [name], clear to auscultation bilaterally  ABD: soft/nontender  EXT: Pedal edema Neuro: Alert and oriented  x3  SUMMARY OF RECOMMENDATIONS DNAR/DNI  MOST Completed, paper copy placed onto the chart electric copy can be found in Vynca  DNR Form Completed, paper copy placed onto the chart electric copy can be found in Vyn[employer]  Comfort focused care  Liberalize visitation  TOC - Appreciate consult for home hospice  Time Spent: 60 Greater than 50% of the time was spent in counseling and coordination of care ______________________________________________________________________________________ [place] Team Team Cell Phone: [phone] Please utilize secure chat with additional questions, if there is no response within 30 minutes please call the above phone number  Palliative Medicine Team providers are available by phone from 7am to 7pm daily and can be reached through the team cell phone.  Should this patient require assistance outside of these hours, please call the patient's attending physician.

## 2020-05-14 NOTE — Progress Notes (Addendum)
Hydrologist [facility]) Hospital Liaison: RN note    Family would like foley catheter placed prior to discharge.   Notified by Transition of Care Manger of patient/family request for [facility] services at home after discharge. Chart and patient information under review by [facility] physician. Hospice eligibility pending currently.    Writer spoke with [name] to initiate education related to hospice philosophy, services and team approach to care.           [name] verbalized understanding of information given. Per discussion, plan is for discharge to home by  PTAR. Family is going to check first with Dr. [name] about continuing Arimidex. This is not something that is covered by hospice and the family is aware.   Please send signed and completed DNR form home with patient/family. Patient will need prescriptions for discharge comfort medications.     DME needs have been discussed, patient currently has the following equipment in the home: Hospital bed.  Patient/family requests the following DME for delivery to the home: Mattress cover or overlay air mattress . Universal equipment manager has been notified and will contact DME provider to arrange delivery to the home. Home address has been verified and is correct in the chart. [name] is the family member to contact to arrange time of delivery.     [facility] aware of the above. Please notify [facility] when patient is ready to leave the unit at discharge. (Call [phone] or [phone] after 5pm.) [facility] information and contact numbers given to [facility].      Please call with any hospice related questions.     Thank you for this referral.     Clementeen Hoof, BSN, [facility] (listed on [facility] under Hospice and [place] [facility] [place])  [phone]

## 2020-05-15 ENCOUNTER — Telehealth: Payer: Self-pay | Admitting: Internal Medicine

## 2020-05-15 ENCOUNTER — Inpatient Hospital Stay: Payer: Medicare Other

## 2020-05-15 DIAGNOSIS — N280 Ischemia and infarction of kidney: Secondary | ICD-10-CM

## 2020-05-15 MED ORDER — LORAZEPAM 1 MG PO TABS: 1.0000 mg | ORAL_TABLET | ORAL | 0 refills | Status: AC | PRN

## 2020-05-15 MED ORDER — MORPHINE SULFATE 10 MG/5ML PO SOLN: 2.5000 mg | ORAL | 0 refills | Status: AC | PRN

## 2020-05-15 MED ORDER — LIDOCAINE 5 % EX PTCH: 1.0000 | MEDICATED_PATCH | CUTANEOUS | 0 refills | Status: AC

## 2020-05-15 NOTE — Telephone Encounter (Signed)
Patients son is calling and stated that patient is leaving the hospital with new medication and is requesting a call back, please advise. CB is [phone]

## 2020-05-15 NOTE — Progress Notes (Signed)
Spoke with Pt's son Wajiha Versteeg who had requested to speak with oncologist regarding his questions. Was able to get in touch with Dr. Myna Hidalgo  and had given Cristal [facility] phone number [phone] for him to call. Dr. Myna Hidalgo to call Drue Novel.

## 2020-05-15 NOTE — Care Management Important Message (Signed)
Important Message  Patient Details  Name: Jasmine TZENG MRN: [id number] Date of Birth: 1945-12-26   Medicare Important Message Given:  Yes     Renie Ora 05/15/2020, 10:36 AM

## 2020-05-15 NOTE — Telephone Encounter (Signed)
[facility] would like to know if Dr. Ardyth Harps would  be the attending Dr for the hospice order / please call  Kendal Hymen [phone]

## 2020-05-15 NOTE — Telephone Encounter (Signed)
Jasmine Hood is calling back and stated that Dr. Bethann Goo would be the attending providing for the hospice order.

## 2020-05-15 NOTE — Progress Notes (Addendum)
Manufacturing engineer [facility]) Hospital Liaison Note  Per family request over the weekend, reached out to patient son to confirm [place] this afternoon. Son was hoping to discuss POC with Oncologist today but has not been able to speak with anyone at the [place] this morning. Son is not sure the family wants to pursue comfort care with hospice upon discharge or chemotherapy treatment until a discussion of patients options are clear.   Supported and answered hospice questions for the family.  Made TOC aware of situation. [facility] will await patient family decision and support as needed.  Please update [place] as this situation evolves.  Thank you,  Gar Ponto, RN [facility] H[facility] [phone]  At 4:30pm - Made aware that patient will go home with hospice in the morning.  Hoyer lift and mattress have been ordered by [facility].

## 2020-05-15 NOTE — Care Management (Addendum)
Spoke to [employer] with [employer] . If patient / son agrees to hospice, hospice will not cover patient's chemo medication. Patient could still take the medication however family would have to provide medication. NCM explained same to son Caryn Bee. Caryn Bee voiced understanding. He is requesting attending MD to give him a call. DR Rito Ehrlich aware and will call son.   Caryn Bee has not decided if he wants hospice at this point.   DME has not been delivered. If patient discharges with hospice, hospice benefit will cover DME. If patient does not discharge with hospice DME will be ordered through patient's insurance.    9150 Dr Rito Ehrlich spoke with Caryn Bee, he has decided to proceed with hospice. Sherry with [facility] aware and will order mattress and hoyer lift.   Ronny Flurry RN

## 2020-05-15 NOTE — Discharge Summary (Addendum)
Discharge Summary  Jasmine Hood SPQ:[id number] DOB: 1945-05-31  PCP: [name], [name], MD  Admit date: 05/05/2020 Anticipated Discharge date: 05/16/2020  Time spent: 25 minutes  Recommendations for Outpatient Follow-up:  1. Patient is now being made comfort care and being discharged home with hospice 2. New medication: Ativan 1 mg every hour as needed 3. New medication: Morphine 10 mg per 5 mL as needed for pain 4. All of patient's home medications including Synthroid and Arimidex are being discontinued. 5. New medication: Lidocaine patch 5%: Apply to lower back.  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, estrogen receptor positive ([place]) 10/17/2016 10/21/2016    Discharge Condition: Home with hospice  Diet recommendation: Comfort feeds  Vitals:   05/14/20 0416 05/14/20 1245  BP: (!) 144/52 (!) 131/49  Pulse: 84 76  Resp: (!) 26 (!) 24  Temp: 97.7 F (36.5 C) 99.1 F (37.3 C)  SpO2: 91% (!) 88%    History of present illness:  Patient is a 74 year old female with past medical history of metastatic breast cancer status postmastectomy on anastrozole who had a recent complicated stay in October with bacteremia and 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   Following treatment of sepsis, patient was moved to the hospitalist service and out of the ICU.  Initially the plan was for her to go to a skilled nursing facility.  However following transfer, patient was eating very little and when this this was discussed with patient, she just said that she wanted to go home.  Palliative care was consulted and after extensive discussion with the patient as well as her family, it was decided that patient would be made comfort care. Hospice was arranged and patient will be discharged on 12/28 with hospice.  Further discussion with family on 12/27.  Hospital Course:   Hypothyroidism: Was on Synthroid.    Stopped now that she is comfort care.   MORBID OBESITY: Meets criteria with BMI greater than 40.   Essential hypertension: Not on any medications at home.  Started on Toprol a few days ago, initial blood pressure was stable but starting to trend back upward.  Trial of Lasix.  Stopped now that she is comfort care.  Blood pressure stable   OSA (obstructive sleep apnea): Not on CPAP.   Transaminitis: Chronic, stable    AKI (acute kidney injury) ([place]): Causing ATN.  Secondary to severe sepsis.  Creatinine now normalized.  Now resolved.  Severe Klebsiella sepsis present on admission [facility]) which developed into septic shock within 24 hours of admission: Secondary to acute UTI.  Patient met criteria for severe sepsis and then septic shock given urinary infection plus lactic acidosis.  Treated aggressively with fluids and antibiotics.  No growth from urine culture.  No longer on antibiotics or fluids.   Renal infarct [facility]): Patient's hospital course was complicated by a renal infarction and she was placed on IV heparin which was discontinued after developed hematoma.  No further bleeding.  Anemia: Chronic macrocytic anemia, likely from chronic disease-sleep apnea and hypertension.  Baseline  looks to be around 10-11 with macrocytosis.  Suspect that she has acute blood loss anemia from her hematoma.  Has since stabilized and trending back upward.  Stable around 8.  Now that she is comfort care we will not check any further labs.  Procedures:  Echocardiogram:Preserved ejection fraction, no evidence of diastolic dysfunction, no significant valvular dysfunction  Consultations:  Palliative care  CCM  Discharge Exam: BP (!) 131/49 (BP Location: Left Wrist)    Pulse 76    Temp 99.1 F (37.3 C) (Axillary)    Resp (!) 24    Ht 4' 11"  (1.499 m)    Wt 94.2 kg    SpO2 (!) 88%    BMI 41.94 kg/m   General: Alert and oriented x2, fatigued Cardiovascular: Regular rate and rhythm, S1-S2 Respiratory: Clear to auscultation bilaterally, moderate airway exchange  Discharge Instructions You were cared for by a hospitalist during your hospital stay. If you have any questions about your discharge medications or the care you received while you were in the hospital after you are discharged, you can call the unit and asked to speak with the hospitalist on call if the hospitalist that took care of you is not available. Once you are discharged, your primary care physician will handle any further medical issues. Please note that NO REFILLS for any discharge medications will be authorized once you are discharged, as it is imperative that you return to your primary care physician (or establish a relationship with a primary care physician if you do not have one) for your aftercare needs so that they can reassess your need for medications and monitor your lab values.  Discharge Instructions    Diet - low sodium heart healthy   Complete by: As directed    No wound care   Complete by: As directed      Allergies as of 05/15/2020      Reactions   Bee Venom Anaphylaxis   Latex Swelling      Medication List    STOP taking these medications   anastrozole 1 MG tablet Commonly known as: ARIMIDEX    aspirin 81 MG chewable tablet   barrier cream Crea Commonly known as: non-specified   Ensure   levothyroxine 25 MCG tablet Commonly known as: SYNTHROID   Menthol (Topical Analgesic) 3.5 % Gel   pantoprazole 40 MG tablet Commonly known as: PROTONIX   polyethylene glycol 17 g packet Commonly known as: MIRALAX / GLYCOLAX     TAKE these medications   lidocaine 5 % Commonly known as: LIDODERM Place 1 patch onto the skin daily. Remove & Discard patch within 12 hours or as directed by MD   LORazepam 1 MG tablet Commonly known as: ATIVAN Take 1 tablet (1 mg total) by mouth every hour as needed for anxiety or sedation.   morphine 10 MG/5ML solution Take 1.3-2.5 mLs (2.6-5 mg total) by mouth every 2 (two) hours as needed (Dyspnea/Pain).      Allergies  Allergen Reactions   Bee Venom Anaphylaxis   Latex Swelling    Follow-up Information    [facility] .   Contact information: [place]  [place] [phone]               The results of significant diagnostics from this hospitalization (including imaging, microbiology, ancillary and laboratory) are listed below for reference.    Significant Diagnostic Studies: DG Chest 1 View  Result Date: 05/05/2020 CLINICAL DATA:  Wheezing and shortness of breath. Shortness of breath for days, progressed today. EXAM: CHEST  1 VIEW COMPARISON:  Chest radiograph 03/14/2020, CT 03/11/2020 FINDINGS: Chronic cardiomegaly. Unchanged mediastinal contours. Interstitial thickening most consistent with pulmonary edema. There are small bilateral pleural effusions. No confluent consolidation. No pneumothorax. Stable osseous structures. IMPRESSION: Cardiomegaly with small pleural effusions and findings of pulmonary edema, consistent with CHF. Electronically Signed   By: Keith Rake M.D.   On: 05/05/2020 15:06   CT Head Wo Contrast  Result Date: 05/05/2020 CLINICAL DATA:  Fall EXAM: CT HEAD  WITHOUT CONTRAST TECHNIQUE: Contiguous axial images were obtained from the base of the skull through the vertex without intravenous contrast. COMPARISON:  03/10/2020 FINDINGS: Brain: There is no acute intracranial hemorrhage, mass effect, or edema. Gray-white differentiation is preserved. There is no extra-axial fluid collection. Ventricles and sulci are within normal limits in size and configuration. Vascular: There is atherosclerotic calcification at the skull base. Skull: Calvarium is unremarkable. Sinuses/Orbits: Mild sphenoid mucosal thickening. Bilateral lens replacements. Other: None. IMPRESSION: No evidence of acute intracranial injury. Electronically Signed   By: Macy Mis M.D.   On: 05/05/2020 15:01   CT Angio Chest PE W and/or Wo Contrast  Result Date: 05/05/2020 CLINICAL DATA:  Worsening dyspnea for several days. Dysuria. Nonlocalized sepsis. Abdominal pain. History of breast cancer. EXAM: CT ANGIOGRAPHY CHEST CT ABDOMEN WITH CONTRAST TECHNIQUE: Multidetector CT imaging of the chest was performed using the standard protocol during bolus administration of intravenous contrast. Multiplanar CT image reconstructions and MIPs were obtained to evaluate the vascular anatomy. Multidetector CT imaging of the abdomen was performed using the standard protocol during bolus administration of intravenous contrast. CONTRAST:  68m OMNIPAQUE IOHEXOL 350 MG/ML SOLN COMPARISON:  03/11/2020 chest CT angiogram.  03/06/2017 PET-CT. FINDINGS: CTA CHEST FINDINGS Cardiovascular: The study is low quality for the evaluation of pulmonary embolism, with significant motion degradation. There are no convincing filling defects in the central, lobar, segmental or subsegmental pulmonary artery branches to suggest acute pulmonary embolism. Normal course and caliber of the thoracic aorta. Top-normal caliber main pulmonary artery (3.1 cm diameter). Normal heart size. No significant pericardial fluid/thickening. Mediastinum/Nodes:  No discrete thyroid nodules. Unremarkable esophagus. No axillary adenopathy. Mildly enlarged 1.0 cm right paratracheal node (series 5/image 54), stable since 03/11/2020 chest CT. No additional pathologically enlarged mediastinal nodes. No hilar adenopathy. Lungs/Pleura: No pneumothorax. No pleural effusion. No acute consolidative airspace disease, lung masses or significant pulmonary nodules. Patchy peribronchovascular and peripheral reticulation and ground-glass opacity throughout both lungs, not appreciably changed. Musculoskeletal: No aggressive appearing focal osseous lesions. Moderate thoracic spondylosis. Stable postsurgical changes from bilateral mastectomy. Review of the MIP images confirms the above findings. CT ABDOMEN FINDINGS Hepatobiliary: Diffusely irregular liver surface with relative hypertrophy of the lateral segment left liver lobe, compatible with cirrhosis. A few scattered indistinct hypodense liver lesions throughout the liver, largest 0.7 cm at the right liver dome (series 3/image 13). No larger liver lesions. Normal gallbladder with no radiopaque cholelithiasis. No biliary ductal dilatation. Pancreas: Normal, with no mass or duct dilation. Spleen: Mild splenomegaly. Craniocaudal splenic length 13.1 cm. No splenic mass. Adrenals/Urinary Tract: Normal adrenals. No hydronephrosis. Patchy segmental perfusion defect throughout the anterior mid  to lower right kidney (series 3/image 45) without significant perinephric fat stranding or fluid. Subcentimeter hypodense renal cortical lesion in the anterior lower left kidney is too small to characterize and requires no follow-up. Mild diffuse bladder wall thickening and mild perivesical fat haziness. Nondistended bladder. Stomach/Bowel: Normal non-distended stomach. Normal small bowel caliber with no small bowel wall thickening. Appendix within normal limits. No large bowel wall thickening or pericolonic fat stranding. No significant colonic  diverticulosis. Vascular/Lymphatic: Mildly atherosclerotic nonaneurysmal abdominal aorta. Patent portal, splenic, hepatic and renal veins. Small paraumbilical varix. No pathologically enlarged lymph nodes in the abdomen. Other: No pneumoperitoneum, ascites or focal fluid collection. Normal size anteverted uterus. Small amount of nonspecific gas in the uterine cavity. No adnexal masses. Musculoskeletal: No aggressive appearing focal osseous lesions. Moderate lumbar spondylosis. Review of the MIP images confirms the above findings. IMPRESSION: 1. Limited motion degraded scan. No evidence of acute pulmonary embolism. 2. 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 3. Mild diffuse bladder wall thickening and mild perivesical fat haziness, suggesting acute cystitis. 4. Cirrhosis. Scattered indistinct subcentimeter hypodense liver lesions, indeterminate. Recommend attention on follow-up MRI abdomen without and with IV contrast in 3-6 months. This recommendation follows ACR consensus guidelines: Managing Incidental Findings on Abdominal CT: White Paper of the ACR Incidental Findings Committee. J Am Coll Radiol 2010;7:754-773 5. Mild splenomegaly. No ascites. Small paraumbilical varix. 6. Nonspecific small amount of gas in the uterine cavity. 7. Patchy peribronchovascular and peripheral reticulation and ground-glass opacity throughout both lungs, not appreciably changed, potentially postinflammatory fibrosis related to the history of COVID-19 infection. 8. Aortic Atherosclerosis (ICD10-I70.0). Electronically Signed   By: Ilona Sorrel M.D.   On: 05/05/2020 17:34   CT ABDOMEN W CONTRAST  Result Date: 05/05/2020 CLINICAL DATA:  Worsening dyspnea for several days. Dysuria. Nonlocalized sepsis. Abdominal pain. History of breast cancer. EXAM: CT ANGIOGRAPHY CHEST CT ABDOMEN WITH  CONTRAST TECHNIQUE: Multidetector CT imaging of the chest was performed using the standard protocol during bolus administration of intravenous contrast. Multiplanar CT image reconstructions and MIPs were obtained to evaluate the vascular anatomy. Multidetector CT imaging of the abdomen was performed using the standard protocol during bolus administration of intravenous contrast. CONTRAST:  7m OMNIPAQUE IOHEXOL 350 MG/ML SOLN COMPARISON:  03/11/2020 chest CT angiogram.  03/06/2017 PET-CT. FINDINGS: CTA CHEST FINDINGS Cardiovascular: The study is low quality for the evaluation of pulmonary embolism, with significant motion degradation. There are no convincing filling defects in the central, lobar, segmental or subsegmental pulmonary artery branches to suggest acute pulmonary embolism. Normal course and caliber of the thoracic aorta. Top-normal caliber main pulmonary artery (3.1 cm diameter). Normal heart size. No significant pericardial fluid/thickening. Mediastinum/Nodes: No discrete thyroid nodules. Unremarkable esophagus. No axillary adenopathy. Mildly enlarged 1.0 cm right paratracheal node (series 5/image 54), stable since 03/11/2020 chest CT. No additional pathologically enlarged mediastinal nodes. No hilar adenopathy. Lungs/Pleura: No pneumothorax. No pleural effusion. No acute consolidative airspace disease, lung masses or significant pulmonary nodules. Patchy peribronchovascular and peripheral reticulation and ground-glass opacity throughout both lungs, not appreciably changed. Musculoskeletal: No aggressive appearing focal osseous lesions. Moderate thoracic spondylosis. Stable postsurgical changes from bilateral mastectomy. Review of the MIP images confirms the above findings. CT ABDOMEN FINDINGS Hepatobiliary: Diffusely irregular liver surface with relative hypertrophy of the lateral segment left liver lobe, compatible with cirrhosis. A few scattered indistinct hypodense liver lesions throughout the  liver, largest 0.7 cm at  the right liver dome (series 3/image 13). No larger liver lesions. Normal gallbladder with no radiopaque cholelithiasis. No biliary ductal dilatation. Pancreas: Normal, with no mass or duct dilation. Spleen: Mild splenomegaly. Craniocaudal splenic length 13.1 cm. No splenic mass. Adrenals/Urinary Tract: Normal adrenals. No hydronephrosis. Patchy segmental perfusion defect throughout the anterior mid to lower right kidney (series 3/image 45) without significant perinephric fat stranding or fluid. Subcentimeter hypodense renal cortical lesion in the anterior lower left kidney is too small to characterize and requires no follow-up. Mild diffuse bladder wall thickening and mild perivesical fat haziness. Nondistended bladder. Stomach/Bowel: Normal non-distended stomach. Normal small bowel caliber with no small bowel wall thickening. Appendix within normal limits. No large bowel wall thickening or pericolonic fat stranding. No significant colonic diverticulosis. Vascular/Lymphatic: Mildly atherosclerotic nonaneurysmal abdominal aorta. Patent portal, splenic, hepatic and renal veins. Small paraumbilical varix. No pathologically enlarged lymph nodes in the abdomen. Other: No pneumoperitoneum, ascites or focal fluid collection. Normal size anteverted uterus. Small amount of nonspecific gas in the uterine cavity. No adnexal masses. Musculoskeletal: No aggressive appearing focal osseous lesions. Moderate lumbar spondylosis. Review of the MIP images confirms the above findings. IMPRESSION: 1. Limited motion degraded scan. No evidence of acute pulmonary embolism. 2. 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 3. Mild diffuse bladder wall thickening and mild perivesical fat haziness, suggesting acute cystitis. 4. Cirrhosis. Scattered indistinct subcentimeter  hypodense liver lesions, indeterminate. Recommend attention on follow-up MRI abdomen without and with IV contrast in 3-6 months. This recommendation follows ACR consensus guidelines: Managing Incidental Findings on Abdominal CT: White Paper of the ACR Incidental Findings Committee. J Am Coll Radiol 2010;7:754-773 5. Mild splenomegaly. No ascites. Small paraumbilical varix. 6. Nonspecific small amount of gas in the uterine cavity. 7. Patchy peribronchovascular and peripheral reticulation and ground-glass opacity throughout both lungs, not appreciably changed, potentially postinflammatory fibrosis related to the history of COVID-19 infection. 8. Aortic Atherosclerosis (ICD10-I70.0). Electronically Signed   By: Ilona Sorrel M.D.   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 Patient has completed her course of antibiotics and is no longer in need the catheter and presents today for. EXAM: REMOVAL OF TUNNELED HEMODIALYSIS CATHETER MEDICATIONS: None COMPARISON:  Image guided tunneled central venous catheter [id number] COMPLICATIONS: None immediate. PROCEDURE: Informed written consent was obtained from the patient following an explanation of the procedure, risks, benefits and alternatives to treatment. A time out was performed prior to the initiation of the procedure. Maximal barrier sterile technique was utilized including caps, mask, sterile gowns, sterile gloves, large sterile drape, hand hygiene, and Betadine. 1% lidocaine with epinephrine was injected under sterile conditions along the subcutaneous tunnel. Utilizing a combination of blunt dissection and gentle traction, the catheter was removed intact. Hemostasis was obtained with manual compression. A dressing was placed. The patient tolerated the  procedure well without immediate post procedural complication. IMPRESSION: Successful removal of tunneled dialysis catheter. Electronically Signed   By: Sandi Mariscal M.D.   On: 04/20/2020 18:40   DG Chest Port 1 View  Result Date: 05/06/2020 CLINICAL DATA:  Sepsis, respiratory distress and history breast carcinoma. EXAM: PORTABLE CHEST 1 VIEW COMPARISON:  05/05/2020 FINDINGS: Stable top-normal heart size. Lungs show slightly improved aeration since the prior chest x-ray  with some residual patchy areas airspace disease bilaterally. Pneumonia would be difficult to exclude. There may be a component of mild interstitial pulmonary edema. No pleural effusions or pneumothorax. IMPRESSION: Slightly improved aeration since the prior chest x-ray with some residual patchy airspace disease bilaterally. Pneumonia would be difficult to exclude. There may be a component of mild interstitial pulmonary edema. Electronically Signed   By: Aletta Edouard M.D.   On: 05/06/2020 09:17   ECHOCARDIOGRAM COMPLETE  Result Date: 05/06/2020    ECHOCARDIOGRAM REPORT   Patient Name:   JEWELS LANGONE Tempesta Date of Exam: 05/06/2020 Medical Rec #:  [id number]      Height:       59.0 in Accession #:    [id number]     Weight:       216.7 lb Date of Birth:  [birth year]      BSA:          1.908 m Patient Age:    45 years       BP:           119/55 mmHg Patient Gender: F              HR:           120 bpm. Exam Location:  Inpatient Procedure: 2D Echo Indications:    Elevated Troponin  History:        Patient has prior history of Echocardiogram examinations, most                 recent 03/10/2020. Stroke; Risk Factors:Non-Smoker and                 Hypertension. Covid 19, Breast Cancer, Double Mastectomy.  Sonographer:    Leavy Cella Referring Phys: [id number] [place]  1. Left ventricular ejection fraction, by estimation, is 60 to 65%. The left ventricle has normal function. The left ventricle has no regional wall motion  abnormalities. There is mild concentric left ventricular hypertrophy. Indeterminate diastolic filling due to E-A fusion.  2. Right ventricular systolic function is normal. The right ventricular size is normal.  3. The mitral valve is normal in structure. Mild mitral valve regurgitation.  4. The aortic valve is tricuspid. There is mild calcification of the aortic valve. There is mild thickening of the aortic valve. Aortic valve regurgitation is mild to moderate. Mild aortic valve sclerosis is present, with no evidence of aortic valve stenosis.  5. The inferior vena cava is normal in size with greater than 50% respiratory variability, suggesting right atrial pressure of 3 mmHg. Comparison(s): Compared to prior TTE on 03/10/20, there is no significant change. FINDINGS  Left Ventricle: Left ventricular ejection fraction, by estimation, is 60 to 65%. The left ventricle has normal function. The left ventricle has no regional wall motion abnormalities. The left ventricular internal cavity size was normal in size. There is  mild concentric left ventricular hypertrophy. Indeterminate diastolic filling due to E-A fusion. Right Ventricle: The right ventricular size is normal. No increase in right ventricular wall thickness. Right ventricular systolic function is normal. Left Atrium: Left atrial size was normal in size. Right Atrium: Right atrial size was normal in size. Pericardium: There is no evidence of pericardial effusion. Mitral Valve: The mitral valve is normal in structure. There is mild thickening of the mitral valve leaflet(s). Mild mitral annular calcification. Mild mitral valve regurgitation. Tricuspid Valve: The tricuspid valve is normal in structure. Tricuspid valve regurgitation is mild. Aortic Valve: The aortic valve is tricuspid.  There is mild calcification of the aortic valve. There is mild thickening of the aortic valve. Aortic valve regurgitation is mild to moderate. Mild aortic valve sclerosis is present,  with no evidence of aortic  valve stenosis. Pulmonic Valve: The pulmonic valve was normal in structure. Pulmonic valve regurgitation is not visualized. Aorta: The aortic root is normal in size and structure. Venous: The inferior vena cava is normal in size with greater than 50% respiratory variability, suggesting right atrial pressure of 3 mmHg. IAS/Shunts: No atrial level shunt detected by color flow Doppler.  LEFT VENTRICLE PLAX 2D LVIDd:         3.30 cm  Diastology LVIDs:         2.40 cm  LV e' medial:    13.40 cm/s LV PW:         1.20 cm  LV E/e' medial:  9.7 LV IVS:        1.30 cm  LV e' lateral:   12.50 cm/s LVOT diam:     1.70 cm  LV E/e' lateral: 10.4 LVOT Area:     2.27 cm  RIGHT VENTRICLE TAPSE (M-mode): 1.5 cm LEFT ATRIUM           Index       RIGHT ATRIUM           Index LA diam:      3.50 cm 1.83 cm/m  RA Area:     11.00 cm LA Vol (A2C): 24.9 ml 13.05 ml/m RA Volume:   25.50 ml  13.36 ml/m LA Vol (A4C): 26.9 ml 14.10 ml/m   AORTA Ao Root diam: 2.30 cm MITRAL VALVE                TRICUSPID VALVE MV Area (PHT): 4.96 cm     TR Peak grad:   25.8 mmHg MV Decel Time: 153 msec     TR Vmax:        254.00 cm/s MV E velocity: 130.00 cm/s                             SHUNTS                             Systemic Diam: 1.70 cm Gwyndolyn Kaufman MD Electronically signed by Gwyndolyn Kaufman MD Signature Date/Time: 05/06/2020/2:41:44 PM    Final    VAS [place] UPPER EXTREMITY VENOUS DUPLEX  Result Date: 05/07/2020 UPPER VENOUS STUDY  Indications: Swelling Limitations: Body habitus, poor ultrasound/tissue interface, bandages, line and patient positioning, patient pain tolerance. Comparison Study: No prior studies. Performing Technologist: Oliver Hum RVT  Examination Guidelines: A complete evaluation includes B-mode imaging, spectral Doppler, color Doppler, and power Doppler as needed of all accessible portions of each vessel. Bilateral testing is considered an integral part of a complete examination. Limited  examinations for reoccurring indications may be performed as noted.  Right Findings: +----------+------------+---------+-----------+----------+-------+  RIGHT      Compressible Phasicity Spontaneous Properties Summary  +----------+------------+---------+-----------+----------+-------+  Subclavian     Full        Yes        Yes                         +----------+------------+---------+-----------+----------+-------+  Left Findings: +----------+------------+---------+-----------+----------+-------+  LEFT       Compressible Phasicity Spontaneous Properties Summary  +----------+------------+---------+-----------+----------+-------+  IJV  Full        Yes        Yes                         +----------+------------+---------+-----------+----------+-------+  Subclavian     Full        Yes        Yes                         +----------+------------+---------+-----------+----------+-------+  Axillary       Full        Yes        Yes                         +----------+------------+---------+-----------+----------+-------+  Brachial       Full        Yes        Yes                         +----------+------------+---------+-----------+----------+-------+  Radial         Full                                               +----------+------------+---------+-----------+----------+-------+  Ulnar          Full                                               +----------+------------+---------+-----------+----------+-------+  Cephalic       Full                                               +----------+------------+---------+-----------+----------+-------+  Basilic        Full                                               +----------+------------+---------+-----------+----------+-------+  Summary:  Right: No evidence of thrombosis in the subclavian.  Left: No evidence of deep vein thrombosis in the upper extremity. No evidence of superficial vein thrombosis in the upper extremity.  *See table(s) above for measurements and  observations.  Diagnosing physician: Monica Martinez MD Electronically signed by Monica Martinez MD on 05/07/2020 at 3:41:41 PM.    Final     Microbiology: Recent Results (from the past 240 hour(s))  Blood culture (routine single)     Status: Abnormal   Collection Time: 05/05/20  3:43 PM   Specimen: BLOOD  Result Value Ref Range Status   Specimen Description BLOOD LEFT ANTECUBITAL  Final   Special Requests   Final    BOTTLES DRAWN AEROBIC AND ANAEROBIC Blood Culture adequate volume   Culture  Setup Time   Final    IN BOTH AEROBIC AND ANAEROBIC BOTTLES GRAM NEGATIVE RODS Organism ID to follow GRAM POSITIVE COCCI CRITICAL RESULT CALLED TO, READ BACK BY AND VERIFIED WITH: J. MILLEN PHARMD, AT 3149 05/06/20 BY D. Victoriano Lain  Culture (A)  Final    KLEBSIELLA PNEUMONIAE STAPHYLOCOCCUS EPIDERMIDIS THE SIGNIFICANCE OF ISOLATING THIS ORGANISM FROM A SINGLE SET OF BLOOD CULTURES WHEN MULTIPLE SETS ARE DRAWN IS UNCERTAIN. PLEASE NOTIFY THE MICROBIOLOGY DEPARTMENT WITHIN ONE WEEK IF SPECIATION AND SENSITIVITIES ARE REQUIRED. Performed at [facility], [place] [address] [place], [place] [address]    Report Status 05/08/2020 FINAL  Final   Organism ID, Bacteria KLEBSIELLA PNEUMONIAE  Final      Susceptibility   Klebsiella pneumoniae - MIC*    AMPICILLIN >=32 RESISTANT Resistant     CEFAZOLIN 8 SENSITIVE Sensitive     CEFEPIME <=0.12 SENSITIVE Sensitive     CEFTAZIDIME <=1 SENSITIVE Sensitive     CEFTRIAXONE <=0.25 SENSITIVE Sensitive     CIPROFLOXACIN <=0.25 SENSITIVE Sensitive     GENTAMICIN <=1 SENSITIVE Sensitive     IMIPENEM <=0.25 SENSITIVE Sensitive     TRIMETH/SULFA <=20 SENSITIVE Sensitive     AMPICILLIN/SULBACTAM >=32 RESISTANT Resistant     PIP/TAZO >=128 RESISTANT Resistant     Extended ESBL NOT DONE      * KLEBSIELLA PNEUMONIAE  Blood Culture ID Panel (Reflexed)     Status: Abnormal   Collection Time: 05/05/20  3:43 PM  Result Value Ref Range Status   Enterococcus  faecalis NOT DETECTED NOT DETECTED Final   Enterococcus Faecium NOT DETECTED NOT DETECTED Final   Listeria monocytogenes NOT DETECTED NOT DETECTED Final   Staphylococcus species DETECTED (A) NOT DETECTED Final    Comment: CRITICAL RESULT CALLED TO, READ BACK BY AND VERIFIED WITH: J. MILLEN PHARMD, AT 6754 05/06/20 BY D. VANHOOK    Staphylococcus aureus (BCID) NOT DETECTED NOT DETECTED Final   Staphylococcus epidermidis DETECTED (A) NOT DETECTED Final    Comment: CRITICAL RESULT CALLED TO, READ BACK BY AND VERIFIED WITH: J. MILLEN PHARMD, AT 4920 05/06/20 BY D. VANHOOK    Staphylococcus lugdunensis NOT DETECTED NOT DETECTED Final   Streptococcus species NOT DETECTED NOT DETECTED Final   Streptococcus agalactiae NOT DETECTED NOT DETECTED Final   Streptococcus pneumoniae NOT DETECTED NOT DETECTED Final   Streptococcus pyogenes NOT DETECTED NOT DETECTED Final   A.calcoaceticus-baumannii NOT DETECTED NOT DETECTED Final   Bacteroides fragilis NOT DETECTED NOT DETECTED Final   Enterobacterales DETECTED (A) NOT DETECTED Final    Comment: Enterobacterales represent a large order of gram negative bacteria, not a single organism. CRITICAL RESULT CALLED TO, READ BACK BY AND VERIFIED WITH: J. MILLEN PHARMD, AT 1007 05/06/20 BY D. VANHOOK    Enterobacter cloacae complex NOT DETECTED NOT DETECTED Final   Escherichia coli NOT DETECTED NOT DETECTED Final   Klebsiella aerogenes NOT DETECTED NOT DETECTED Final   Klebsiella oxytoca NOT DETECTED NOT DETECTED Final   Klebsiella pneumoniae DETECTED (A) NOT DETECTED Final    Comment: CRITICAL RESULT CALLED TO, READ BACK BY AND VERIFIED WITH: J. MILLEN PHARMD, AT 1219 05/06/20 BY D. VANHOOK    Proteus species NOT DETECTED NOT DETECTED Final   Salmonella species NOT DETECTED NOT DETECTED Final   Serratia marcescens NOT DETECTED NOT DETECTED Final   Haemophilus influenzae NOT DETECTED NOT DETECTED Final   Neisseria meningitidis NOT DETECTED NOT DETECTED  Final   Pseudomonas aeruginosa NOT DETECTED NOT DETECTED Final   Stenotrophomonas maltophilia NOT DETECTED NOT DETECTED Final   Candida albicans NOT DETECTED NOT DETECTED Final   Candida auris NOT DETECTED NOT DETECTED Final   Candida glabrata NOT DETECTED NOT DETECTED Final   Candida krusei NOT DETECTED NOT DETECTED Final  Candida parapsilosis NOT DETECTED NOT DETECTED Final   Candida tropicalis NOT DETECTED NOT DETECTED Final   Cryptococcus neoformans/gattii NOT DETECTED NOT DETECTED Final   CTX-M ESBL NOT DETECTED NOT DETECTED Final   Carbapenem resistance IMP NOT DETECTED NOT DETECTED Final   Carbapenem resistance KPC NOT DETECTED NOT DETECTED Final   Methicillin resistance mecA/C NOT DETECTED NOT DETECTED Final   Carbapenem resistance NDM NOT DETECTED NOT DETECTED Final   Carbapenem resist OXA 48 LIKE NOT DETECTED NOT DETECTED Final   Carbapenem resistance VIM NOT DETECTED NOT DETECTED Final    Comment: Performed at [facility], [place] [address], [place], [place] [address]  MRSA PCR Screening     Status: None   Collection Time: 05/05/20  9:45 PM   Specimen: Nasal Mucosa; Nasopharyngeal  Result Value Ref Range Status   MRSA by PCR NEGATIVE NEGATIVE Final    Comment:        The GeneXpert MRSA Assay (FDA approved for NASAL specimens only), is one component of a comprehensive MRSA colonization surveillance program. It is not intended to diagnose MRSA infection nor to guide or monitor treatment for MRSA infections. Performed at [facility], [place] [address], [place], [place] [address]   Urine culture     Status: None   Collection Time: 05/05/20 11:05 PM   Specimen: In/Out Cath Urine  Result Value Ref Range Status   Specimen Description IN/OUT CATH URINE  Final   Special Requests NONE  Final   Culture   Final    NO GROWTH Performed at [facility], [place] [address], [place], [place] [address]    Report Status 05/07/2020 FINAL  Final  Culture, blood (routine  x 2)     Status: None   Collection Time: 05/05/20 11:14 PM   Specimen: BLOOD LEFT HAND  Result Value Ref Range Status   Specimen Description BLOOD LEFT HAND  Final   Special Requests   Final    BOTTLES DRAWN AEROBIC AND ANAEROBIC Blood Culture adequate volume   Culture   Final    NO GROWTH 5 DAYS Performed at [facility], [place] [address] [place], [place] [address]    Report Status 05/11/2020 FINAL  Final  Culture, blood (routine x 2)     Status: None   Collection Time: 05/06/20  3:10 AM   Specimen: BLOOD LEFT HAND  Result Value Ref Range Status   Specimen Description BLOOD LEFT HAND  Final   Special Requests   Final    BOTTLES DRAWN AEROBIC ONLY Blood Culture adequate volume   Culture   Final    NO GROWTH 5 DAYS Performed at [facility], [place] [address], [place], [place] [address]    Report Status 05/11/2020 FINAL  Final  Resp Panel by RT-PCR (Flu A&B, Covid) Nasopharyngeal Swab     Status: None   Collection Time: 05/08/20 12:59 PM   Specimen: Nasopharyngeal Swab; Nasopharyngeal(NP) swabs in vial transport medium  Result Value Ref Range Status   SARS Coronavirus 2 by RT PCR NEGATIVE NEGATIVE Final    Comment: (NOTE) SARS-CoV-2 target nucleic acids are NOT DETECTED.  The SARS-CoV-2 RNA is generally detectable in upper respiratory specimens during the acute phase of infection. The lowest concentration of SARS-CoV-2 viral copies this assay can detect is 138 copies/mL. A negative result does not preclude SARS-Cov-2 infection and should not be used as the sole basis for treatment or other patient management decisions. A negative result  may occur with  improper specimen collection/handling, submission of specimen other than nasopharyngeal swab, presence of viral mutation(s) within the areas targeted by this assay, and inadequate number of viral copies(<138 copies/mL). A negative result must be combined with clinical observations, patient history, and  epidemiological information. The expected result is Negative.  Fact Sheet for Patients:  EntrepreneurPulse.com.au  Fact Sheet for Healthcare Providers:  IncredibleEmployment.be  This test is no t yet approved or cleared by the [place] FDA and  has been authorized for detection and/or diagnosis of SARS-CoV-2 by FDA under an Emergency Use Authorization (EUA). This EUA will remain  in effect (meaning this test can be used) for the duration of the COVID-19 declaration under Section 564(b)(1) of the Act, 21 U.S.C.section 360bbb-3(b)(1), unless the authorization is terminated  or revoked sooner.       Influenza A by PCR NEGATIVE NEGATIVE Final   Influenza B by PCR NEGATIVE NEGATIVE Final    Comment: (NOTE) The Xpert Xpress SARS-CoV-2/FLU/RSV plus assay is intended as an aid in the diagnosis of influenza from Nasopharyngeal swab specimens and should not be used as a sole basis for treatment. Nasal washings and aspirates are unacceptable for Xpert Xpress SARS-CoV-2/FLU/RSV testing.  Fact Sheet for Patients: EntrepreneurPulse.com.au  Fact Sheet for Healthcare Providers: IncredibleEmployment.be  This test is not yet approved or cleared by the [place] FDA and has been authorized for detection and/or diagnosis of SARS-CoV-2 by FDA under an Emergency Use Authorization (EUA). This EUA will remain in effect (meaning this test can be used) for the duration of the COVID-19 declaration under Section 564(b)(1) of the Act, 21 U.S.C. section 360bbb-3(b)(1), unless the authorization is terminated or revoked.  Performed at [facility], [place] [address] [place], [place] [address]      Labs: Basic Metabolic Panel: Recent Labs  Lab 05/09/20 0156 05/11/20 0126 05/13/20 0154  NA 133* 134* 129*  K 4.2 4.8 5.0  CL 103 103 95*  CO2 22 23 26   GLUCOSE 152* 91 99  BUN 16 11 12   CREATININE 0.56 0.55 0.77   CALCIUM 7.2* 7.2* 8.1*   Liver Function Tests: Recent Labs  Lab 05/09/20 0156 05/11/20 0126  AST 58* 58*  ALT 38 34  ALKPHOS 133* 167*  BILITOT 0.9 0.6  PROT 5.9* 5.8*  ALBUMIN 1.8* 1.8*   No results for input(s): LIPASE, AMYLASE in the last 168 hours. No results for input(s): AMMONIA in the last 168 hours. CBC: Recent Labs  Lab 05/09/20 0156 05/11/20 0126 05/12/20 0246 05/13/20 0154 05/14/20 0450  WBC 6.3 4.5 4.8 5.0 5.3  HGB 8.2* 7.7* 8.2* 7.8* 7.8*  HCT 25.4* 25.8* 25.4* 24.8* 24.0*  MCV 95.1 99.2 95.5 94.3 93.8  PLT 60* 68* 113* 125* 131*   Cardiac Enzymes: No results for input(s): CKTOTAL, CKMB, CKMBINDEX, TROPONINI in the last 168 hours. BNP: BNP (last 3 results) Recent Labs    03/10/20 1700 03/14/20 0333 05/05/20 1436  BNP 93.0 278.4* 1,073.6*    ProBNP (last 3 results) No results for input(s): PROBNP in the last 8760 hours.  CBG: Recent Labs  Lab 05/13/20 2327 05/14/20 0359 05/14/20 0747 05/14/20 1145 05/14/20 1642  GLUCAP 75 74 83 84 89       Signed:  Annita Brod, MD [facility] 05/15/2020, 10:32 AM

## 2020-05-15 NOTE — Care Management (Signed)
Spoke to bedside nurse , [facility] to see if someone is available to speak to son.   Ronny Flurry RN

## 2020-05-16 NOTE — Telephone Encounter (Signed)
I have already read her hospital charts, please thank him for the update and let them know to reach out to me if any concerns.

## 2020-05-16 NOTE — Telephone Encounter (Signed)
Fyi  Son wanted Dr Ardyth Harps to know that the patient will be leaving the hospital with Hospice care.

## 2020-05-16 NOTE — TOC Progression Note (Addendum)
Transition of Care [facility]) - Progression Note    Patient Details  Name: Jasmine Hood MRN: [id number] Date of Birth: [birth year]  [facility] CM/SW Contact  Nadene Rubins Adria Devon, RN Phone Number: 05/16/2020, 11:06 AM  Clinical Narrative:     Sherron Monday to Caryn Bee by phone. Confirmed family has agreed to home with hospice. [facility] ordered air  Mattress and hoyer lift. Patient will need PTAR home.   Patient lives with Jasmine Hood believes someone called Jasmine Hood and said the air mattress is on back order. NCM called Jasmine Hood [phone] and left message awaiting call back.  Spoke with daughter Jasmine Hood. Air mattress is on back order. Hoyer lift to be delivered this evening. Jasmine Hood ready for her mother to be transported home by ambulance . NCM confirmed address.   Will ask bedside nurse what time to tell PTAR. PTAR called for 3 pm   Expected Discharge Plan: Home w Hospice Care Barriers to Discharge: Continued Medical Work up  Expected Discharge Plan and Services Expected Discharge Plan: Home w Hospice Care       Living arrangements for the past 2 months: Single Family Home Expected Discharge Date: 05/15/20                                     Social Determinants of Health (SDOH) Interventions    Readmission Risk Interventions No flowsheet data found.

## 2020-05-31 ENCOUNTER — Ambulatory Visit: Payer: Medicare Other | Admitting: Neurology

## 2020-05-31 ENCOUNTER — Telehealth: Payer: Self-pay | Admitting: Internal Medicine

## 2020-05-31 NOTE — Telephone Encounter (Signed)
Jasmine Hood is calling and wanted to let the provider know that the patient passed away on June 12, 2020 at 10:35 am at her home and also the death certificate will be entered in the [facility] system. CB is [phone]

## 2020-06-20 DEATH — deceased

## 2020-08-07 ENCOUNTER — Ambulatory Visit: Payer: Medicare Other

## 2020-08-07 ENCOUNTER — Other Ambulatory Visit: Payer: Medicare Other

## 2021-11-30 ENCOUNTER — Telehealth: Payer: Self-pay | Admitting: Genetic Counselor

## 2021-11-30 NOTE — Telephone Encounter (Signed)
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  Discussed mutations in CTNNA1 increase gastric cancer risk.  Recommended genetic for Quinnetta's relatives.  Mailing information to share with family to Kevin's address:   [place] Arca [address] [place], [place] [address]

## 2021-12-03 ENCOUNTER — Encounter: Payer: Self-pay | Admitting: Genetic Counselor
